# Patient Record
Sex: Female | Born: 1970 | Race: White | Hispanic: No | Smoking: Current every day smoker
Health system: Southern US, Community
[De-identification: ages and names within clinical notes are randomized; demographics above are authoritative.]

## PROBLEM LIST (undated history)

## (undated) DIAGNOSIS — Z8619 Personal history of other infectious and parasitic diseases: Secondary | ICD-10-CM

## (undated) DIAGNOSIS — G43909 Migraine, unspecified, not intractable, without status migrainosus: Secondary | ICD-10-CM

## (undated) DIAGNOSIS — I82409 Acute embolism and thrombosis of unspecified deep veins of unspecified lower extremity: Secondary | ICD-10-CM

## (undated) DIAGNOSIS — I639 Cerebral infarction, unspecified: Secondary | ICD-10-CM

## (undated) DIAGNOSIS — R9431 Abnormal electrocardiogram [ECG] [EKG]: Secondary | ICD-10-CM

## (undated) DIAGNOSIS — F319 Bipolar disorder, unspecified: Secondary | ICD-10-CM

## (undated) DIAGNOSIS — N952 Postmenopausal atrophic vaginitis: Secondary | ICD-10-CM

## (undated) DIAGNOSIS — R079 Chest pain, unspecified: Secondary | ICD-10-CM

## (undated) DIAGNOSIS — B379 Candidiasis, unspecified: Secondary | ICD-10-CM

## (undated) DIAGNOSIS — N951 Menopausal and female climacteric states: Secondary | ICD-10-CM

## (undated) DIAGNOSIS — E039 Hypothyroidism, unspecified: Secondary | ICD-10-CM

## (undated) DIAGNOSIS — IMO0002 Reserved for concepts with insufficient information to code with codable children: Secondary | ICD-10-CM

## (undated) DIAGNOSIS — F419 Anxiety disorder, unspecified: Secondary | ICD-10-CM

## (undated) DIAGNOSIS — E785 Hyperlipidemia, unspecified: Secondary | ICD-10-CM

## (undated) DIAGNOSIS — E7800 Pure hypercholesterolemia, unspecified: Secondary | ICD-10-CM

## (undated) DIAGNOSIS — I10 Essential (primary) hypertension: Secondary | ICD-10-CM

## (undated) HISTORY — PX: TONSILLECTOMY: SUR1361

## (undated) HISTORY — DX: Migraine, unspecified, not intractable, without status migrainosus: G43.909

## (undated) HISTORY — PX: CHOLECYSTECTOMY: SHX55

## (undated) HISTORY — DX: Pure hypercholesterolemia, unspecified: E78.00

## (undated) HISTORY — PX: APPENDECTOMY: SHX54

## (undated) HISTORY — DX: Candidiasis, unspecified: B37.9

## (undated) HISTORY — DX: Menopausal and female climacteric states: N95.1

## (undated) HISTORY — DX: Abnormal electrocardiogram (ECG) (EKG): R94.31

## (undated) HISTORY — DX: Personal history of other infectious and parasitic diseases: Z86.19

## (undated) HISTORY — DX: Hypothyroidism, unspecified: E03.9

## (undated) HISTORY — PX: WISDOM TOOTH EXTRACTION: SHX21

## (undated) HISTORY — DX: Cerebral infarction, unspecified: I63.9

## (undated) HISTORY — DX: Anxiety disorder, unspecified: F41.9

## (undated) HISTORY — DX: Reserved for concepts with insufficient information to code with codable children: IMO0002

## (undated) HISTORY — DX: Hyperlipidemia, unspecified: E78.5

## (undated) HISTORY — PX: CARPAL TUNNEL RELEASE: SHX101

## (undated) HISTORY — PX: OTHER SURGICAL HISTORY: SHX169

## (undated) HISTORY — DX: Chest pain, unspecified: R07.9

## (undated) HISTORY — DX: Postmenopausal atrophic vaginitis: N95.2

---

## 1998-07-14 ENCOUNTER — Ambulatory Visit (HOSPITAL_COMMUNITY): Admission: RE | Admit: 1998-07-14 | Discharge: 1998-07-14 | Payer: Self-pay | Admitting: Family Medicine

## 1998-07-14 ENCOUNTER — Encounter: Payer: Self-pay | Admitting: Family Medicine

## 1998-07-19 ENCOUNTER — Inpatient Hospital Stay (HOSPITAL_COMMUNITY): Admission: AD | Admit: 1998-07-19 | Discharge: 1998-07-19 | Payer: Self-pay | Admitting: Obstetrics and Gynecology

## 1998-07-30 ENCOUNTER — Emergency Department (HOSPITAL_COMMUNITY): Admission: EM | Admit: 1998-07-30 | Discharge: 1998-07-30 | Payer: Self-pay

## 1998-08-06 ENCOUNTER — Encounter: Payer: Self-pay | Admitting: Emergency Medicine

## 1998-08-06 ENCOUNTER — Emergency Department (HOSPITAL_COMMUNITY): Admission: EM | Admit: 1998-08-06 | Discharge: 1998-08-06 | Payer: Self-pay | Admitting: Emergency Medicine

## 1998-10-15 ENCOUNTER — Ambulatory Visit (HOSPITAL_COMMUNITY): Admission: RE | Admit: 1998-10-15 | Discharge: 1998-10-15 | Payer: Self-pay | Admitting: Family Medicine

## 1998-10-15 ENCOUNTER — Encounter: Payer: Self-pay | Admitting: Family Medicine

## 1998-10-31 HISTORY — PX: ABDOMINAL HYSTERECTOMY: SHX81

## 1998-11-15 ENCOUNTER — Emergency Department (HOSPITAL_COMMUNITY): Admission: EM | Admit: 1998-11-15 | Discharge: 1998-11-16 | Payer: Self-pay | Admitting: Emergency Medicine

## 1998-11-15 ENCOUNTER — Encounter: Payer: Self-pay | Admitting: Emergency Medicine

## 1998-11-16 ENCOUNTER — Encounter: Payer: Self-pay | Admitting: Emergency Medicine

## 1999-01-12 ENCOUNTER — Inpatient Hospital Stay (HOSPITAL_COMMUNITY): Admission: AD | Admit: 1999-01-12 | Discharge: 1999-01-12 | Payer: Self-pay | Admitting: Obstetrics and Gynecology

## 1999-02-11 ENCOUNTER — Encounter: Payer: Self-pay | Admitting: Emergency Medicine

## 1999-02-11 ENCOUNTER — Emergency Department (HOSPITAL_COMMUNITY): Admission: EM | Admit: 1999-02-11 | Discharge: 1999-02-11 | Payer: Self-pay | Admitting: Emergency Medicine

## 1999-02-17 ENCOUNTER — Emergency Department (HOSPITAL_COMMUNITY): Admission: EM | Admit: 1999-02-17 | Discharge: 1999-02-18 | Payer: Self-pay | Admitting: Emergency Medicine

## 1999-04-12 ENCOUNTER — Emergency Department (HOSPITAL_COMMUNITY): Admission: EM | Admit: 1999-04-12 | Discharge: 1999-04-12 | Payer: Self-pay | Admitting: Emergency Medicine

## 1999-05-09 ENCOUNTER — Emergency Department (HOSPITAL_COMMUNITY): Admission: EM | Admit: 1999-05-09 | Discharge: 1999-05-09 | Payer: Self-pay | Admitting: *Deleted

## 1999-06-02 ENCOUNTER — Emergency Department (HOSPITAL_COMMUNITY): Admission: EM | Admit: 1999-06-02 | Discharge: 1999-06-02 | Payer: Self-pay | Admitting: Emergency Medicine

## 1999-06-03 ENCOUNTER — Emergency Department (HOSPITAL_COMMUNITY): Admission: EM | Admit: 1999-06-03 | Discharge: 1999-06-03 | Payer: Self-pay | Admitting: Emergency Medicine

## 1999-07-04 ENCOUNTER — Emergency Department (HOSPITAL_COMMUNITY): Admission: EM | Admit: 1999-07-04 | Discharge: 1999-07-04 | Payer: Self-pay | Admitting: Emergency Medicine

## 1999-08-01 ENCOUNTER — Emergency Department (HOSPITAL_COMMUNITY): Admission: EM | Admit: 1999-08-01 | Discharge: 1999-08-01 | Payer: Self-pay | Admitting: Emergency Medicine

## 1999-11-01 HISTORY — PX: REPLACEMENT TOTAL KNEE: SUR1224

## 1999-11-29 ENCOUNTER — Emergency Department (HOSPITAL_COMMUNITY): Admission: EM | Admit: 1999-11-29 | Discharge: 1999-11-29 | Payer: Self-pay | Admitting: Emergency Medicine

## 1999-12-14 ENCOUNTER — Emergency Department (HOSPITAL_COMMUNITY): Admission: EM | Admit: 1999-12-14 | Discharge: 1999-12-14 | Payer: Self-pay

## 2000-01-05 ENCOUNTER — Ambulatory Visit (HOSPITAL_COMMUNITY): Admission: RE | Admit: 2000-01-05 | Discharge: 2000-01-05 | Payer: Self-pay | Admitting: Family Medicine

## 2000-01-05 ENCOUNTER — Encounter: Payer: Self-pay | Admitting: Family Medicine

## 2000-02-07 ENCOUNTER — Emergency Department (HOSPITAL_COMMUNITY): Admission: EM | Admit: 2000-02-07 | Discharge: 2000-02-08 | Payer: Self-pay | Admitting: Emergency Medicine

## 2000-02-08 ENCOUNTER — Encounter: Payer: Self-pay | Admitting: Emergency Medicine

## 2000-02-14 ENCOUNTER — Encounter: Payer: Self-pay | Admitting: Family Medicine

## 2000-02-14 ENCOUNTER — Ambulatory Visit (HOSPITAL_COMMUNITY): Admission: RE | Admit: 2000-02-14 | Discharge: 2000-02-14 | Payer: Self-pay | Admitting: Family Medicine

## 2000-02-23 ENCOUNTER — Emergency Department (HOSPITAL_COMMUNITY): Admission: EM | Admit: 2000-02-23 | Discharge: 2000-02-24 | Payer: Self-pay | Admitting: Internal Medicine

## 2000-04-06 ENCOUNTER — Inpatient Hospital Stay (HOSPITAL_COMMUNITY): Admission: AD | Admit: 2000-04-06 | Discharge: 2000-04-06 | Payer: Self-pay | Admitting: Obstetrics and Gynecology

## 2000-04-07 ENCOUNTER — Encounter: Payer: Self-pay | Admitting: Obstetrics and Gynecology

## 2000-06-08 ENCOUNTER — Emergency Department (HOSPITAL_COMMUNITY): Admission: EM | Admit: 2000-06-08 | Discharge: 2000-06-09 | Payer: Self-pay | Admitting: *Deleted

## 2000-06-28 ENCOUNTER — Inpatient Hospital Stay: Admission: EM | Admit: 2000-06-28 | Discharge: 2000-06-29 | Payer: Self-pay | Admitting: Obstetrics and Gynecology

## 2000-06-28 ENCOUNTER — Encounter: Payer: Self-pay | Admitting: Obstetrics and Gynecology

## 2000-08-11 ENCOUNTER — Emergency Department (HOSPITAL_COMMUNITY): Admission: EM | Admit: 2000-08-11 | Discharge: 2000-08-11 | Payer: Self-pay | Admitting: Emergency Medicine

## 2000-08-11 ENCOUNTER — Encounter: Payer: Self-pay | Admitting: Emergency Medicine

## 2000-08-31 ENCOUNTER — Inpatient Hospital Stay (HOSPITAL_COMMUNITY): Admission: AD | Admit: 2000-08-31 | Discharge: 2000-08-31 | Payer: Self-pay | Admitting: Obstetrics and Gynecology

## 2000-09-04 ENCOUNTER — Emergency Department (HOSPITAL_COMMUNITY): Admission: EM | Admit: 2000-09-04 | Discharge: 2000-09-05 | Payer: Self-pay | Admitting: Emergency Medicine

## 2000-09-18 ENCOUNTER — Inpatient Hospital Stay (HOSPITAL_COMMUNITY): Admission: AD | Admit: 2000-09-18 | Discharge: 2000-09-18 | Payer: Self-pay | Admitting: Obstetrics and Gynecology

## 2000-09-19 ENCOUNTER — Emergency Department (HOSPITAL_COMMUNITY): Admission: EM | Admit: 2000-09-19 | Discharge: 2000-09-20 | Payer: Self-pay | Admitting: Emergency Medicine

## 2000-09-29 ENCOUNTER — Emergency Department (HOSPITAL_COMMUNITY): Admission: EM | Admit: 2000-09-29 | Discharge: 2000-09-29 | Payer: Self-pay | Admitting: Emergency Medicine

## 2000-09-30 ENCOUNTER — Emergency Department (HOSPITAL_COMMUNITY): Admission: EM | Admit: 2000-09-30 | Discharge: 2000-09-30 | Payer: Self-pay | Admitting: Emergency Medicine

## 2000-11-27 ENCOUNTER — Encounter: Payer: Self-pay | Admitting: Emergency Medicine

## 2000-11-27 ENCOUNTER — Emergency Department (HOSPITAL_COMMUNITY): Admission: EM | Admit: 2000-11-27 | Discharge: 2000-11-27 | Payer: Self-pay | Admitting: Emergency Medicine

## 2001-03-24 ENCOUNTER — Emergency Department (HOSPITAL_COMMUNITY): Admission: EM | Admit: 2001-03-24 | Discharge: 2001-03-24 | Payer: Self-pay | Admitting: *Deleted

## 2001-04-08 ENCOUNTER — Emergency Department (HOSPITAL_COMMUNITY): Admission: EM | Admit: 2001-04-08 | Discharge: 2001-04-08 | Payer: Self-pay | Admitting: Emergency Medicine

## 2001-04-12 ENCOUNTER — Encounter: Payer: Self-pay | Admitting: Obstetrics and Gynecology

## 2001-04-12 ENCOUNTER — Inpatient Hospital Stay (HOSPITAL_COMMUNITY): Admission: AD | Admit: 2001-04-12 | Discharge: 2001-04-12 | Payer: Self-pay | Admitting: Obstetrics and Gynecology

## 2001-04-15 ENCOUNTER — Emergency Department (HOSPITAL_COMMUNITY): Admission: EM | Admit: 2001-04-15 | Discharge: 2001-04-15 | Payer: Self-pay | Admitting: Emergency Medicine

## 2001-04-15 ENCOUNTER — Encounter: Payer: Self-pay | Admitting: Emergency Medicine

## 2001-06-24 ENCOUNTER — Emergency Department (HOSPITAL_COMMUNITY): Admission: EM | Admit: 2001-06-24 | Discharge: 2001-06-25 | Payer: Self-pay | Admitting: Emergency Medicine

## 2001-07-13 ENCOUNTER — Emergency Department (HOSPITAL_COMMUNITY): Admission: EM | Admit: 2001-07-13 | Discharge: 2001-07-13 | Payer: Self-pay | Admitting: *Deleted

## 2001-07-17 ENCOUNTER — Other Ambulatory Visit: Admission: RE | Admit: 2001-07-17 | Discharge: 2001-07-17 | Payer: Self-pay | Admitting: Obstetrics and Gynecology

## 2001-08-28 ENCOUNTER — Encounter: Admission: RE | Admit: 2001-08-28 | Discharge: 2001-08-28 | Payer: Self-pay | Admitting: Orthopedic Surgery

## 2001-08-28 ENCOUNTER — Encounter: Payer: Self-pay | Admitting: Orthopedic Surgery

## 2001-10-12 ENCOUNTER — Emergency Department (HOSPITAL_COMMUNITY): Admission: EM | Admit: 2001-10-12 | Discharge: 2001-10-12 | Payer: Self-pay | Admitting: Emergency Medicine

## 2001-12-18 ENCOUNTER — Encounter: Payer: Self-pay | Admitting: Emergency Medicine

## 2001-12-18 ENCOUNTER — Emergency Department (HOSPITAL_COMMUNITY): Admission: EM | Admit: 2001-12-18 | Discharge: 2001-12-18 | Payer: Self-pay | Admitting: Emergency Medicine

## 2001-12-20 ENCOUNTER — Emergency Department (HOSPITAL_COMMUNITY): Admission: EM | Admit: 2001-12-20 | Discharge: 2001-12-20 | Payer: Self-pay | Admitting: Emergency Medicine

## 2002-02-24 ENCOUNTER — Emergency Department (HOSPITAL_COMMUNITY): Admission: EM | Admit: 2002-02-24 | Discharge: 2002-02-25 | Payer: Self-pay | Admitting: Emergency Medicine

## 2002-02-26 ENCOUNTER — Emergency Department (HOSPITAL_COMMUNITY): Admission: EM | Admit: 2002-02-26 | Discharge: 2002-02-26 | Payer: Self-pay | Admitting: Emergency Medicine

## 2002-02-27 ENCOUNTER — Inpatient Hospital Stay (HOSPITAL_COMMUNITY): Admission: EM | Admit: 2002-02-27 | Discharge: 2002-03-04 | Payer: Self-pay | Admitting: Internal Medicine

## 2002-03-01 ENCOUNTER — Encounter: Payer: Self-pay | Admitting: Nephrology

## 2002-04-11 ENCOUNTER — Encounter: Payer: Self-pay | Admitting: Family Medicine

## 2002-04-11 ENCOUNTER — Ambulatory Visit (HOSPITAL_COMMUNITY): Admission: RE | Admit: 2002-04-11 | Discharge: 2002-04-11 | Payer: Self-pay | Admitting: Family Medicine

## 2002-04-21 ENCOUNTER — Emergency Department (HOSPITAL_COMMUNITY): Admission: EM | Admit: 2002-04-21 | Discharge: 2002-04-21 | Payer: Self-pay

## 2002-05-18 ENCOUNTER — Emergency Department (HOSPITAL_COMMUNITY): Admission: EM | Admit: 2002-05-18 | Discharge: 2002-05-18 | Payer: Self-pay | Admitting: Emergency Medicine

## 2002-05-23 ENCOUNTER — Emergency Department (HOSPITAL_COMMUNITY): Admission: EM | Admit: 2002-05-23 | Discharge: 2002-05-23 | Payer: Self-pay | Admitting: Emergency Medicine

## 2002-05-23 ENCOUNTER — Encounter: Payer: Self-pay | Admitting: Emergency Medicine

## 2002-05-31 ENCOUNTER — Emergency Department (HOSPITAL_COMMUNITY): Admission: EM | Admit: 2002-05-31 | Discharge: 2002-06-01 | Payer: Self-pay | Admitting: Emergency Medicine

## 2002-06-30 ENCOUNTER — Emergency Department (HOSPITAL_COMMUNITY): Admission: EM | Admit: 2002-06-30 | Discharge: 2002-06-30 | Payer: Self-pay | Admitting: Emergency Medicine

## 2002-07-16 ENCOUNTER — Other Ambulatory Visit: Admission: RE | Admit: 2002-07-16 | Discharge: 2002-07-16 | Payer: Self-pay | Admitting: Obstetrics & Gynecology

## 2002-08-12 ENCOUNTER — Emergency Department (HOSPITAL_COMMUNITY): Admission: EM | Admit: 2002-08-12 | Discharge: 2002-08-12 | Payer: Self-pay | Admitting: Emergency Medicine

## 2002-08-27 ENCOUNTER — Emergency Department (HOSPITAL_COMMUNITY): Admission: EM | Admit: 2002-08-27 | Discharge: 2002-08-27 | Payer: Self-pay | Admitting: *Deleted

## 2002-09-09 ENCOUNTER — Emergency Department (HOSPITAL_COMMUNITY): Admission: EM | Admit: 2002-09-09 | Discharge: 2002-09-09 | Payer: Self-pay | Admitting: Emergency Medicine

## 2002-11-07 ENCOUNTER — Emergency Department (HOSPITAL_COMMUNITY): Admission: EM | Admit: 2002-11-07 | Discharge: 2002-11-08 | Payer: Self-pay | Admitting: Emergency Medicine

## 2002-11-08 ENCOUNTER — Encounter: Payer: Self-pay | Admitting: Emergency Medicine

## 2002-12-05 ENCOUNTER — Encounter: Payer: Self-pay | Admitting: Internal Medicine

## 2002-12-05 ENCOUNTER — Ambulatory Visit (HOSPITAL_COMMUNITY): Admission: RE | Admit: 2002-12-05 | Discharge: 2002-12-05 | Payer: Self-pay | Admitting: Internal Medicine

## 2003-02-08 ENCOUNTER — Emergency Department (HOSPITAL_COMMUNITY): Admission: EM | Admit: 2003-02-08 | Discharge: 2003-02-08 | Payer: Self-pay | Admitting: Emergency Medicine

## 2003-02-09 ENCOUNTER — Emergency Department (HOSPITAL_COMMUNITY): Admission: EM | Admit: 2003-02-09 | Discharge: 2003-02-09 | Payer: Self-pay | Admitting: Emergency Medicine

## 2003-02-15 ENCOUNTER — Encounter: Payer: Self-pay | Admitting: Emergency Medicine

## 2003-02-15 ENCOUNTER — Inpatient Hospital Stay (HOSPITAL_COMMUNITY): Admission: EM | Admit: 2003-02-15 | Discharge: 2003-02-19 | Payer: Self-pay | Admitting: Emergency Medicine

## 2003-04-26 ENCOUNTER — Emergency Department (HOSPITAL_COMMUNITY): Admission: EM | Admit: 2003-04-26 | Discharge: 2003-04-26 | Payer: Self-pay | Admitting: Emergency Medicine

## 2003-05-11 ENCOUNTER — Emergency Department (HOSPITAL_COMMUNITY): Admission: EM | Admit: 2003-05-11 | Discharge: 2003-05-11 | Payer: Self-pay | Admitting: Emergency Medicine

## 2003-05-21 ENCOUNTER — Other Ambulatory Visit: Admission: RE | Admit: 2003-05-21 | Discharge: 2003-05-21 | Payer: Self-pay | Admitting: Obstetrics and Gynecology

## 2003-06-09 ENCOUNTER — Emergency Department (HOSPITAL_COMMUNITY): Admission: EM | Admit: 2003-06-09 | Discharge: 2003-06-09 | Payer: Self-pay | Admitting: Emergency Medicine

## 2003-06-22 ENCOUNTER — Emergency Department (HOSPITAL_COMMUNITY): Admission: EM | Admit: 2003-06-22 | Discharge: 2003-06-22 | Payer: Self-pay | Admitting: Emergency Medicine

## 2003-07-05 ENCOUNTER — Emergency Department (HOSPITAL_COMMUNITY): Admission: EM | Admit: 2003-07-05 | Discharge: 2003-07-06 | Payer: Self-pay

## 2003-07-06 ENCOUNTER — Encounter: Payer: Self-pay | Admitting: Emergency Medicine

## 2003-07-14 ENCOUNTER — Encounter: Payer: Self-pay | Admitting: Emergency Medicine

## 2003-07-14 ENCOUNTER — Emergency Department (HOSPITAL_COMMUNITY): Admission: EM | Admit: 2003-07-14 | Discharge: 2003-07-14 | Payer: Self-pay | Admitting: Emergency Medicine

## 2003-07-31 ENCOUNTER — Emergency Department (HOSPITAL_COMMUNITY): Admission: EM | Admit: 2003-07-31 | Discharge: 2003-07-31 | Payer: Self-pay | Admitting: Emergency Medicine

## 2003-07-31 ENCOUNTER — Encounter: Payer: Self-pay | Admitting: Emergency Medicine

## 2003-08-08 ENCOUNTER — Encounter: Payer: Self-pay | Admitting: Emergency Medicine

## 2003-08-08 ENCOUNTER — Emergency Department (HOSPITAL_COMMUNITY): Admission: EM | Admit: 2003-08-08 | Discharge: 2003-08-08 | Payer: Self-pay | Admitting: Emergency Medicine

## 2003-08-14 ENCOUNTER — Encounter (INDEPENDENT_AMBULATORY_CARE_PROVIDER_SITE_OTHER): Payer: Self-pay

## 2003-08-14 ENCOUNTER — Inpatient Hospital Stay (HOSPITAL_COMMUNITY): Admission: RE | Admit: 2003-08-14 | Discharge: 2003-08-17 | Payer: Self-pay | Admitting: Obstetrics and Gynecology

## 2003-10-27 ENCOUNTER — Emergency Department (HOSPITAL_COMMUNITY): Admission: EM | Admit: 2003-10-27 | Discharge: 2003-10-28 | Payer: Self-pay | Admitting: Family Medicine

## 2003-11-12 ENCOUNTER — Emergency Department (HOSPITAL_COMMUNITY): Admission: EM | Admit: 2003-11-12 | Discharge: 2003-11-12 | Payer: Self-pay | Admitting: Emergency Medicine

## 2003-11-22 ENCOUNTER — Emergency Department (HOSPITAL_COMMUNITY): Admission: EM | Admit: 2003-11-22 | Discharge: 2003-11-22 | Payer: Self-pay | Admitting: Emergency Medicine

## 2003-12-20 ENCOUNTER — Emergency Department (HOSPITAL_COMMUNITY): Admission: EM | Admit: 2003-12-20 | Discharge: 2003-12-20 | Payer: Self-pay | Admitting: Emergency Medicine

## 2004-01-02 ENCOUNTER — Observation Stay (HOSPITAL_COMMUNITY): Admission: EM | Admit: 2004-01-02 | Discharge: 2004-01-03 | Payer: Self-pay | Admitting: Emergency Medicine

## 2004-01-02 ENCOUNTER — Encounter (INDEPENDENT_AMBULATORY_CARE_PROVIDER_SITE_OTHER): Payer: Self-pay | Admitting: *Deleted

## 2004-02-08 ENCOUNTER — Emergency Department (HOSPITAL_COMMUNITY): Admission: AD | Admit: 2004-02-08 | Discharge: 2004-02-08 | Payer: Self-pay | Admitting: Family Medicine

## 2004-02-17 ENCOUNTER — Emergency Department (HOSPITAL_COMMUNITY): Admission: EM | Admit: 2004-02-17 | Discharge: 2004-02-17 | Payer: Self-pay | Admitting: Family Medicine

## 2004-02-20 ENCOUNTER — Emergency Department (HOSPITAL_COMMUNITY): Admission: EM | Admit: 2004-02-20 | Discharge: 2004-02-20 | Payer: Self-pay | Admitting: Family Medicine

## 2004-03-16 ENCOUNTER — Emergency Department (HOSPITAL_COMMUNITY): Admission: EM | Admit: 2004-03-16 | Discharge: 2004-03-17 | Payer: Self-pay | Admitting: Emergency Medicine

## 2004-03-19 ENCOUNTER — Emergency Department (HOSPITAL_COMMUNITY): Admission: EM | Admit: 2004-03-19 | Discharge: 2004-03-19 | Payer: Self-pay | Admitting: Family Medicine

## 2004-03-29 ENCOUNTER — Emergency Department (HOSPITAL_COMMUNITY): Admission: EM | Admit: 2004-03-29 | Discharge: 2004-03-29 | Payer: Self-pay | Admitting: Family Medicine

## 2004-04-09 ENCOUNTER — Inpatient Hospital Stay (HOSPITAL_COMMUNITY): Admission: AD | Admit: 2004-04-09 | Discharge: 2004-04-10 | Payer: Self-pay | Admitting: Obstetrics & Gynecology

## 2004-05-28 ENCOUNTER — Emergency Department (HOSPITAL_COMMUNITY): Admission: EM | Admit: 2004-05-28 | Discharge: 2004-05-28 | Payer: Self-pay | Admitting: Family Medicine

## 2004-06-24 ENCOUNTER — Ambulatory Visit (HOSPITAL_COMMUNITY): Admission: RE | Admit: 2004-06-24 | Discharge: 2004-06-24 | Payer: Self-pay | Admitting: Family Medicine

## 2004-07-21 ENCOUNTER — Ambulatory Visit: Payer: Self-pay | Admitting: Nurse Practitioner

## 2004-07-25 ENCOUNTER — Emergency Department (HOSPITAL_COMMUNITY): Admission: EM | Admit: 2004-07-25 | Discharge: 2004-07-25 | Payer: Self-pay | Admitting: Emergency Medicine

## 2004-08-17 ENCOUNTER — Inpatient Hospital Stay (HOSPITAL_COMMUNITY): Admission: AD | Admit: 2004-08-17 | Discharge: 2004-08-20 | Payer: Self-pay | Admitting: Orthopedic Surgery

## 2004-08-17 ENCOUNTER — Ambulatory Visit: Payer: Self-pay | Admitting: Physical Medicine & Rehabilitation

## 2004-08-20 ENCOUNTER — Inpatient Hospital Stay (HOSPITAL_COMMUNITY)
Admission: RE | Admit: 2004-08-20 | Discharge: 2004-09-01 | Payer: Self-pay | Admitting: Physical Medicine & Rehabilitation

## 2004-08-20 ENCOUNTER — Ambulatory Visit: Payer: Self-pay | Admitting: Physical Medicine & Rehabilitation

## 2004-09-14 ENCOUNTER — Encounter: Admission: RE | Admit: 2004-09-14 | Discharge: 2004-11-15 | Payer: Self-pay | Admitting: Orthopedic Surgery

## 2004-09-15 ENCOUNTER — Ambulatory Visit: Payer: Self-pay | Admitting: Nurse Practitioner

## 2004-09-16 ENCOUNTER — Inpatient Hospital Stay (HOSPITAL_COMMUNITY): Admission: EM | Admit: 2004-09-16 | Discharge: 2004-09-19 | Payer: Self-pay

## 2004-10-26 ENCOUNTER — Emergency Department (HOSPITAL_COMMUNITY): Admission: EM | Admit: 2004-10-26 | Discharge: 2004-10-26 | Payer: Self-pay | Admitting: Emergency Medicine

## 2004-11-08 ENCOUNTER — Ambulatory Visit: Payer: Self-pay | Admitting: Nurse Practitioner

## 2004-11-30 ENCOUNTER — Ambulatory Visit: Payer: Self-pay | Admitting: Psychiatry

## 2004-11-30 ENCOUNTER — Inpatient Hospital Stay (HOSPITAL_COMMUNITY): Admission: RE | Admit: 2004-11-30 | Discharge: 2004-12-02 | Payer: Self-pay | Admitting: Psychiatry

## 2004-12-02 ENCOUNTER — Inpatient Hospital Stay (HOSPITAL_COMMUNITY): Admission: EM | Admit: 2004-12-02 | Discharge: 2004-12-04 | Payer: Self-pay | Admitting: Emergency Medicine

## 2004-12-04 ENCOUNTER — Ambulatory Visit: Payer: Self-pay | Admitting: Psychiatry

## 2004-12-04 ENCOUNTER — Inpatient Hospital Stay (HOSPITAL_COMMUNITY): Admission: EM | Admit: 2004-12-04 | Discharge: 2004-12-07 | Payer: Self-pay | Admitting: Psychiatry

## 2004-12-16 ENCOUNTER — Ambulatory Visit: Payer: Self-pay | Admitting: Nurse Practitioner

## 2004-12-30 ENCOUNTER — Ambulatory Visit: Payer: Self-pay | Admitting: Nurse Practitioner

## 2005-01-07 ENCOUNTER — Encounter: Admission: RE | Admit: 2005-01-07 | Discharge: 2005-04-07 | Payer: Self-pay | Admitting: Orthopedic Surgery

## 2005-01-24 ENCOUNTER — Ambulatory Visit: Payer: Self-pay | Admitting: Nurse Practitioner

## 2005-02-04 ENCOUNTER — Ambulatory Visit: Payer: Self-pay | Admitting: Nurse Practitioner

## 2005-02-24 ENCOUNTER — Ambulatory Visit: Payer: Self-pay | Admitting: Nurse Practitioner

## 2005-03-07 ENCOUNTER — Ambulatory Visit: Payer: Self-pay | Admitting: Nurse Practitioner

## 2005-04-05 ENCOUNTER — Ambulatory Visit: Payer: Self-pay | Admitting: Psychiatry

## 2005-04-05 ENCOUNTER — Inpatient Hospital Stay (HOSPITAL_COMMUNITY): Admission: RE | Admit: 2005-04-05 | Discharge: 2005-04-08 | Payer: Self-pay | Admitting: Psychiatry

## 2005-05-20 ENCOUNTER — Ambulatory Visit (HOSPITAL_COMMUNITY): Admission: RE | Admit: 2005-05-20 | Discharge: 2005-05-20 | Payer: Self-pay | Admitting: Orthopedic Surgery

## 2005-05-31 ENCOUNTER — Other Ambulatory Visit: Admission: RE | Admit: 2005-05-31 | Discharge: 2005-05-31 | Payer: Self-pay | Admitting: Family Medicine

## 2005-05-31 ENCOUNTER — Ambulatory Visit: Payer: Self-pay | Admitting: Nurse Practitioner

## 2005-05-31 LAB — CONVERTED CEMR LAB: Pap Smear: NORMAL

## 2005-06-06 ENCOUNTER — Ambulatory Visit (HOSPITAL_COMMUNITY): Admission: RE | Admit: 2005-06-06 | Discharge: 2005-06-06 | Payer: Self-pay | Admitting: Family Medicine

## 2005-08-01 ENCOUNTER — Ambulatory Visit: Payer: Self-pay | Admitting: Nurse Practitioner

## 2005-09-08 ENCOUNTER — Ambulatory Visit: Payer: Self-pay | Admitting: Nurse Practitioner

## 2005-11-25 ENCOUNTER — Ambulatory Visit: Payer: Self-pay | Admitting: Nurse Practitioner

## 2005-12-04 ENCOUNTER — Emergency Department (HOSPITAL_COMMUNITY): Admission: EM | Admit: 2005-12-04 | Discharge: 2005-12-04 | Payer: Self-pay | Admitting: Family Medicine

## 2006-01-09 ENCOUNTER — Ambulatory Visit: Payer: Self-pay | Admitting: Nurse Practitioner

## 2006-01-25 ENCOUNTER — Ambulatory Visit: Payer: Self-pay | Admitting: Nurse Practitioner

## 2006-03-06 ENCOUNTER — Ambulatory Visit: Payer: Self-pay | Admitting: Nurse Practitioner

## 2006-03-31 ENCOUNTER — Ambulatory Visit: Payer: Self-pay | Admitting: Nurse Practitioner

## 2006-06-02 ENCOUNTER — Ambulatory Visit: Payer: Self-pay | Admitting: Nurse Practitioner

## 2006-06-12 ENCOUNTER — Ambulatory Visit: Payer: Self-pay | Admitting: Nurse Practitioner

## 2006-06-20 ENCOUNTER — Ambulatory Visit: Payer: Self-pay | Admitting: Nurse Practitioner

## 2006-06-23 ENCOUNTER — Ambulatory Visit (HOSPITAL_COMMUNITY): Admission: RE | Admit: 2006-06-23 | Discharge: 2006-06-23 | Payer: Self-pay | Admitting: Family Medicine

## 2006-07-11 ENCOUNTER — Ambulatory Visit (HOSPITAL_BASED_OUTPATIENT_CLINIC_OR_DEPARTMENT_OTHER): Admission: RE | Admit: 2006-07-11 | Discharge: 2006-07-11 | Payer: Self-pay | Admitting: Orthopedic Surgery

## 2006-07-24 ENCOUNTER — Ambulatory Visit: Payer: Self-pay | Admitting: Nurse Practitioner

## 2006-07-25 ENCOUNTER — Encounter: Admission: RE | Admit: 2006-07-25 | Discharge: 2006-10-23 | Payer: Self-pay | Admitting: Orthopedic Surgery

## 2006-08-21 ENCOUNTER — Ambulatory Visit: Payer: Self-pay | Admitting: Internal Medicine

## 2006-09-18 ENCOUNTER — Ambulatory Visit: Payer: Self-pay | Admitting: Nurse Practitioner

## 2006-09-18 LAB — CONVERTED CEMR LAB: TSH: 1.361 microintl units/mL

## 2006-10-09 ENCOUNTER — Ambulatory Visit: Payer: Self-pay | Admitting: Nurse Practitioner

## 2006-11-28 ENCOUNTER — Ambulatory Visit: Payer: Self-pay | Admitting: Nurse Practitioner

## 2007-01-17 ENCOUNTER — Encounter: Payer: Self-pay | Admitting: Nurse Practitioner

## 2007-01-17 DIAGNOSIS — F3130 Bipolar disorder, current episode depressed, mild or moderate severity, unspecified: Secondary | ICD-10-CM

## 2007-01-17 DIAGNOSIS — E039 Hypothyroidism, unspecified: Secondary | ICD-10-CM

## 2007-01-17 DIAGNOSIS — I10 Essential (primary) hypertension: Secondary | ICD-10-CM | POA: Insufficient documentation

## 2007-01-17 HISTORY — DX: Hypothyroidism, unspecified: E03.9

## 2007-01-17 HISTORY — DX: Bipolar disorder, current episode depressed, mild or moderate severity, unspecified: F31.30

## 2007-04-10 DIAGNOSIS — K219 Gastro-esophageal reflux disease without esophagitis: Secondary | ICD-10-CM

## 2007-04-10 DIAGNOSIS — E7800 Pure hypercholesterolemia, unspecified: Secondary | ICD-10-CM

## 2007-04-10 DIAGNOSIS — J309 Allergic rhinitis, unspecified: Secondary | ICD-10-CM

## 2007-04-10 HISTORY — DX: Gastro-esophageal reflux disease without esophagitis: K21.9

## 2007-04-10 HISTORY — DX: Allergic rhinitis, unspecified: J30.9

## 2007-04-10 HISTORY — DX: Pure hypercholesterolemia, unspecified: E78.00

## 2007-04-13 ENCOUNTER — Ambulatory Visit: Payer: Self-pay | Admitting: Family Medicine

## 2007-04-23 ENCOUNTER — Emergency Department (HOSPITAL_COMMUNITY): Admission: EM | Admit: 2007-04-23 | Discharge: 2007-04-23 | Payer: Self-pay | Admitting: Emergency Medicine

## 2007-05-15 ENCOUNTER — Ambulatory Visit (HOSPITAL_BASED_OUTPATIENT_CLINIC_OR_DEPARTMENT_OTHER): Admission: RE | Admit: 2007-05-15 | Discharge: 2007-05-15 | Payer: Self-pay | Admitting: Orthopedic Surgery

## 2007-07-11 ENCOUNTER — Emergency Department (HOSPITAL_COMMUNITY): Admission: EM | Admit: 2007-07-11 | Discharge: 2007-07-11 | Payer: Self-pay | Admitting: Emergency Medicine

## 2007-07-30 ENCOUNTER — Encounter (INDEPENDENT_AMBULATORY_CARE_PROVIDER_SITE_OTHER): Payer: Self-pay | Admitting: Nurse Practitioner

## 2007-07-30 ENCOUNTER — Ambulatory Visit: Payer: Self-pay | Admitting: Internal Medicine

## 2007-07-30 LAB — CONVERTED CEMR LAB: Valproic Acid Lvl: 61.7 ug/mL (ref 50.0–100.0)

## 2007-08-15 ENCOUNTER — Ambulatory Visit: Payer: Self-pay | Admitting: Family Medicine

## 2007-08-15 ENCOUNTER — Encounter (INDEPENDENT_AMBULATORY_CARE_PROVIDER_SITE_OTHER): Payer: Self-pay | Admitting: Nurse Practitioner

## 2007-08-15 LAB — CONVERTED CEMR LAB: TSH: 0.023 microintl units/mL — ABNORMAL LOW (ref 0.350–5.50)

## 2007-08-28 ENCOUNTER — Ambulatory Visit: Payer: Self-pay | Admitting: Family Medicine

## 2007-10-18 ENCOUNTER — Ambulatory Visit: Payer: Self-pay | Admitting: Internal Medicine

## 2008-03-18 ENCOUNTER — Emergency Department (HOSPITAL_COMMUNITY): Admission: EM | Admit: 2008-03-18 | Discharge: 2008-03-18 | Payer: Self-pay | Admitting: Emergency Medicine

## 2008-03-28 ENCOUNTER — Emergency Department (HOSPITAL_COMMUNITY): Admission: EM | Admit: 2008-03-28 | Discharge: 2008-03-28 | Payer: Self-pay | Admitting: Emergency Medicine

## 2008-04-17 ENCOUNTER — Ambulatory Visit: Payer: Self-pay | Admitting: Internal Medicine

## 2008-04-23 ENCOUNTER — Encounter: Admission: RE | Admit: 2008-04-23 | Discharge: 2008-04-23 | Payer: Self-pay | Admitting: Internal Medicine

## 2008-06-09 ENCOUNTER — Ambulatory Visit: Payer: Self-pay | Admitting: Internal Medicine

## 2008-06-09 ENCOUNTER — Encounter (INDEPENDENT_AMBULATORY_CARE_PROVIDER_SITE_OTHER): Payer: Self-pay | Admitting: Family Medicine

## 2008-07-31 ENCOUNTER — Ambulatory Visit: Payer: Self-pay | Admitting: Internal Medicine

## 2008-07-31 ENCOUNTER — Encounter: Payer: Self-pay | Admitting: Internal Medicine

## 2008-07-31 LAB — CONVERTED CEMR LAB
AST: 9 units/L (ref 0–37)
BUN: 18 mg/dL (ref 6–23)
Basophils Absolute: 0 10*3/uL (ref 0.0–0.1)
Calcium: 8.8 mg/dL (ref 8.4–10.5)
Chloride: 104 meq/L (ref 96–112)
Creatinine, Ser: 1.18 mg/dL (ref 0.40–1.20)
Eosinophils Relative: 1 % (ref 0–5)
Glucose, Bld: 80 mg/dL (ref 70–99)
HCT: 42.2 % (ref 36.0–46.0)
HDL: 56 mg/dL (ref >39)
Hemoglobin: 13.7 g/dL (ref 12.0–15.0)
Lymphs Abs: 2.7 10*3/uL (ref 0.7–4.0)
MCV: 95.9 fL (ref 78.0–100.0)
Monocytes Absolute: 0.5 10*3/uL (ref 0.1–1.0)
Monocytes Relative: 8 % (ref 3–12)
Neutro Abs: 3 10*3/uL (ref 1.7–7.7)
Platelets: 190 10*3/uL (ref 150–400)
Potassium: 4.4 meq/L (ref 3.5–5.3)
Total Protein: 6.3 g/dL (ref 6.0–8.3)

## 2008-10-13 ENCOUNTER — Ambulatory Visit: Payer: Self-pay | Admitting: Internal Medicine

## 2008-12-12 ENCOUNTER — Telehealth: Payer: Self-pay | Admitting: Internal Medicine

## 2009-01-14 ENCOUNTER — Encounter (INDEPENDENT_AMBULATORY_CARE_PROVIDER_SITE_OTHER): Payer: Self-pay | Admitting: Internal Medicine

## 2009-01-14 ENCOUNTER — Ambulatory Visit: Payer: Self-pay | Admitting: Internal Medicine

## 2009-01-14 LAB — CONVERTED CEMR LAB
AST: 6 units/L (ref 0–37)
Alkaline Phosphatase: 53 units/L (ref 39–117)
BUN: 21 mg/dL (ref 6–23)
Calcium: 9.1 mg/dL (ref 8.4–10.5)
Potassium: 4.8 meq/L (ref 3.5–5.3)
TSH: 0.498 microintl units/mL (ref 0.350–4.500)
Total Bilirubin: 0.4 mg/dL (ref 0.3–1.2)

## 2009-02-13 ENCOUNTER — Emergency Department (HOSPITAL_COMMUNITY): Admission: EM | Admit: 2009-02-13 | Discharge: 2009-02-13 | Payer: Self-pay | Admitting: Emergency Medicine

## 2009-02-27 ENCOUNTER — Emergency Department (HOSPITAL_COMMUNITY): Admission: EM | Admit: 2009-02-27 | Discharge: 2009-02-27 | Payer: Self-pay | Admitting: Emergency Medicine

## 2009-06-03 ENCOUNTER — Emergency Department (HOSPITAL_COMMUNITY): Admission: EM | Admit: 2009-06-03 | Discharge: 2009-06-04 | Payer: Self-pay | Admitting: Emergency Medicine

## 2009-06-16 ENCOUNTER — Telehealth (INDEPENDENT_AMBULATORY_CARE_PROVIDER_SITE_OTHER): Payer: Self-pay | Admitting: *Deleted

## 2009-06-23 ENCOUNTER — Ambulatory Visit: Payer: Self-pay | Admitting: Internal Medicine

## 2009-08-18 ENCOUNTER — Emergency Department (HOSPITAL_COMMUNITY): Admission: EM | Admit: 2009-08-18 | Discharge: 2009-08-18 | Payer: Self-pay | Admitting: Emergency Medicine

## 2009-08-26 ENCOUNTER — Emergency Department (HOSPITAL_COMMUNITY): Admission: EM | Admit: 2009-08-26 | Discharge: 2009-08-27 | Payer: Self-pay | Admitting: Emergency Medicine

## 2009-09-02 ENCOUNTER — Encounter: Admission: RE | Admit: 2009-09-02 | Discharge: 2009-09-02 | Payer: Self-pay | Admitting: Family Medicine

## 2009-09-09 ENCOUNTER — Encounter: Admission: RE | Admit: 2009-09-09 | Discharge: 2009-09-09 | Payer: Self-pay | Admitting: Family Medicine

## 2009-09-15 ENCOUNTER — Inpatient Hospital Stay (HOSPITAL_COMMUNITY): Admission: EM | Admit: 2009-09-15 | Discharge: 2009-09-16 | Payer: Self-pay | Admitting: Emergency Medicine

## 2009-09-15 ENCOUNTER — Telehealth (INDEPENDENT_AMBULATORY_CARE_PROVIDER_SITE_OTHER): Payer: Self-pay | Admitting: *Deleted

## 2009-10-01 ENCOUNTER — Emergency Department (HOSPITAL_COMMUNITY): Admission: EM | Admit: 2009-10-01 | Discharge: 2009-10-02 | Payer: Self-pay | Admitting: Emergency Medicine

## 2009-10-06 ENCOUNTER — Telehealth (INDEPENDENT_AMBULATORY_CARE_PROVIDER_SITE_OTHER): Payer: Self-pay | Admitting: *Deleted

## 2009-10-08 ENCOUNTER — Encounter (INDEPENDENT_AMBULATORY_CARE_PROVIDER_SITE_OTHER): Payer: Self-pay | Admitting: Internal Medicine

## 2009-10-08 ENCOUNTER — Ambulatory Visit: Payer: Self-pay | Admitting: Family Medicine

## 2009-10-08 LAB — CONVERTED CEMR LAB
Basophils Absolute: 0 10*3/uL (ref 0.0–0.1)
Basophils Relative: 0 % (ref 0–1)
Eosinophils Absolute: 0.1 10*3/uL (ref 0.0–0.7)
Eosinophils Relative: 1 % (ref 0–5)
Hemoglobin: 14.9 g/dL (ref 12.0–15.0)
Lymphocytes Relative: 49 % — ABNORMAL HIGH (ref 12–46)
MCHC: 35.3 g/dL (ref 30.0–36.0)
RBC: 4.52 M/uL (ref 3.87–5.11)
RDW: 12.9 % (ref 11.5–15.5)
TSH: 0.204 microintl units/mL — ABNORMAL LOW (ref 0.350–4.500)
WBC: 7.5 10*3/uL (ref 4.0–10.5)

## 2009-10-09 ENCOUNTER — Encounter (INDEPENDENT_AMBULATORY_CARE_PROVIDER_SITE_OTHER): Payer: Self-pay | Admitting: Internal Medicine

## 2009-10-25 ENCOUNTER — Inpatient Hospital Stay (HOSPITAL_COMMUNITY): Admission: EM | Admit: 2009-10-25 | Discharge: 2009-10-27 | Payer: Self-pay | Admitting: Emergency Medicine

## 2009-12-01 ENCOUNTER — Ambulatory Visit: Payer: Self-pay | Admitting: Internal Medicine

## 2009-12-09 ENCOUNTER — Ambulatory Visit: Payer: Self-pay | Admitting: Internal Medicine

## 2009-12-22 ENCOUNTER — Telehealth (INDEPENDENT_AMBULATORY_CARE_PROVIDER_SITE_OTHER): Payer: Self-pay | Admitting: *Deleted

## 2009-12-25 ENCOUNTER — Ambulatory Visit: Payer: Self-pay | Admitting: Internal Medicine

## 2009-12-25 LAB — CONVERTED CEMR LAB
AST: 12 units/L (ref 0–37)
Alkaline Phosphatase: 60 units/L (ref 39–117)
BUN: 21 mg/dL (ref 6–23)
Basophils Absolute: 0 10*3/uL (ref 0.0–0.1)
CO2: 25 meq/L (ref 19–32)
Eosinophils Relative: 1 % (ref 0–5)
HCT: 40.4 % (ref 36.0–46.0)
Monocytes Absolute: 0.6 10*3/uL (ref 0.1–1.0)
Neutrophils Relative %: 44 % (ref 43–77)
Platelets: 199 10*3/uL (ref 150–400)
RBC: 4.18 M/uL (ref 3.87–5.11)
Sodium: 138 meq/L (ref 135–145)
TSH: 5.943 microintl units/mL — ABNORMAL HIGH (ref 0.350–4.500)
Total Protein: 6.7 g/dL (ref 6.0–8.3)

## 2009-12-28 ENCOUNTER — Ambulatory Visit: Payer: Self-pay | Admitting: Internal Medicine

## 2010-01-11 ENCOUNTER — Telehealth (INDEPENDENT_AMBULATORY_CARE_PROVIDER_SITE_OTHER): Payer: Self-pay | Admitting: *Deleted

## 2010-01-14 ENCOUNTER — Ambulatory Visit: Payer: Self-pay | Admitting: Internal Medicine

## 2010-01-14 ENCOUNTER — Encounter (INDEPENDENT_AMBULATORY_CARE_PROVIDER_SITE_OTHER): Payer: Self-pay | Admitting: Internal Medicine

## 2010-01-14 LAB — CONVERTED CEMR LAB: CRP: 0 mg/dL (ref <0.6)

## 2010-02-03 ENCOUNTER — Encounter (INDEPENDENT_AMBULATORY_CARE_PROVIDER_SITE_OTHER): Payer: Self-pay | Admitting: Adult Health

## 2010-02-03 ENCOUNTER — Ambulatory Visit: Payer: Self-pay | Admitting: Internal Medicine

## 2010-02-03 LAB — CONVERTED CEMR LAB
AST: 9 units/L (ref 0–37)
Alkaline Phosphatase: 59 units/L (ref 39–117)
Calcium: 8.8 mg/dL (ref 8.4–10.5)
Chlamydia, Swab/Urine, PCR: NEGATIVE
Chloride: 105 meq/L (ref 96–112)
Glucose, Bld: 77 mg/dL (ref 70–99)
Hemoglobin: 13.5 g/dL (ref 12.0–15.0)
Lymphocytes Relative: 44 % (ref 12–46)
Lymphs Abs: 3.4 10*3/uL (ref 0.7–4.0)
MCV: 93.9 fL (ref 78.0–100.0)
Monocytes Absolute: 0.4 10*3/uL (ref 0.1–1.0)
Monocytes Relative: 5 % (ref 3–12)
Neutro Abs: 3.9 10*3/uL (ref 1.7–7.7)
Neutrophils Relative %: 50 % (ref 43–77)
Platelets: 188 10*3/uL (ref 150–400)
RBC: 4.29 M/uL (ref 3.87–5.11)

## 2010-02-04 ENCOUNTER — Encounter (INDEPENDENT_AMBULATORY_CARE_PROVIDER_SITE_OTHER): Payer: Self-pay | Admitting: Adult Health

## 2010-02-04 ENCOUNTER — Emergency Department (HOSPITAL_COMMUNITY): Admission: EM | Admit: 2010-02-04 | Discharge: 2010-02-04 | Payer: Self-pay | Admitting: Emergency Medicine

## 2010-02-04 ENCOUNTER — Ambulatory Visit (HOSPITAL_COMMUNITY): Admission: RE | Admit: 2010-02-04 | Discharge: 2010-02-04 | Payer: Self-pay | Admitting: Internal Medicine

## 2010-02-10 ENCOUNTER — Ambulatory Visit: Payer: Self-pay | Admitting: Adult Health

## 2010-02-17 ENCOUNTER — Ambulatory Visit: Payer: Self-pay | Admitting: Internal Medicine

## 2010-02-17 LAB — CONVERTED CEMR LAB: CRP: 0 mg/dL (ref <0.6)

## 2010-02-23 ENCOUNTER — Ambulatory Visit: Payer: Self-pay | Admitting: Internal Medicine

## 2010-04-22 ENCOUNTER — Ambulatory Visit: Payer: Self-pay | Admitting: Internal Medicine

## 2010-06-02 ENCOUNTER — Ambulatory Visit: Payer: Self-pay | Admitting: Internal Medicine

## 2010-07-01 ENCOUNTER — Ambulatory Visit: Payer: Self-pay | Admitting: Family Medicine

## 2010-07-02 ENCOUNTER — Encounter (INDEPENDENT_AMBULATORY_CARE_PROVIDER_SITE_OTHER): Payer: Self-pay | Admitting: Family Medicine

## 2010-07-04 ENCOUNTER — Emergency Department (HOSPITAL_COMMUNITY): Admission: EM | Admit: 2010-07-04 | Discharge: 2010-07-04 | Payer: Self-pay | Admitting: Emergency Medicine

## 2010-09-06 ENCOUNTER — Encounter: Admission: RE | Admit: 2010-09-06 | Discharge: 2010-09-06 | Payer: Self-pay | Admitting: Family Medicine

## 2010-10-09 ENCOUNTER — Emergency Department (HOSPITAL_COMMUNITY)
Admission: EM | Admit: 2010-10-09 | Discharge: 2010-10-09 | Payer: Self-pay | Source: Home / Self Care | Admitting: Family Medicine

## 2010-10-23 ENCOUNTER — Emergency Department (HOSPITAL_COMMUNITY)
Admission: EM | Admit: 2010-10-23 | Discharge: 2010-10-23 | Payer: Self-pay | Source: Home / Self Care | Admitting: Emergency Medicine

## 2010-10-31 DIAGNOSIS — I82409 Acute embolism and thrombosis of unspecified deep veins of unspecified lower extremity: Secondary | ICD-10-CM | POA: Insufficient documentation

## 2010-10-31 HISTORY — DX: Acute embolism and thrombosis of unspecified deep veins of unspecified lower extremity: I82.409

## 2010-11-15 ENCOUNTER — Ambulatory Visit (HOSPITAL_COMMUNITY)
Admission: RE | Admit: 2010-11-15 | Discharge: 2010-11-15 | Payer: Self-pay | Source: Home / Self Care | Attending: Orthopedic Surgery | Admitting: Orthopedic Surgery

## 2010-11-15 ENCOUNTER — Encounter (INDEPENDENT_AMBULATORY_CARE_PROVIDER_SITE_OTHER): Payer: Self-pay | Admitting: Orthopedic Surgery

## 2010-11-16 ENCOUNTER — Emergency Department (HOSPITAL_COMMUNITY)
Admission: EM | Admit: 2010-11-16 | Discharge: 2010-11-16 | Payer: Self-pay | Source: Home / Self Care | Admitting: Emergency Medicine

## 2010-11-21 ENCOUNTER — Encounter: Payer: Self-pay | Admitting: Nurse Practitioner

## 2010-11-21 ENCOUNTER — Encounter: Payer: Self-pay | Admitting: Family Medicine

## 2010-12-02 NOTE — Progress Notes (Signed)
Summary: triage/white patches in mouth  Phone Note Call from Patient   Reason for Call: Talk to Nurse Summary of Call: patient complaining of white patches that rub off and bleed in her mouth..She saw Dr. Pamalee Leyden on 12/09/09 for acute maxillary sinusitus and cough..She was given a z-pak with one refill...she states after taking the antibiotic she developed the white patches in her mouth which are very painful...Dr. Pamalee Leyden called in magic mouthwast upon patients reqest..She now states she wanted numbing medication to go along with it because of the pain. She also states she needs nasonex because of her sinus congestion. She stated to on call nurse last night she has a fever of 101.8.Marland KitchenMarland Kitchenshe has not taken her temp. today.Marland KitchenAppointment made in Dr. Neita Carp schedule tomorrow. Initial call taken by: Conchita Paris,  December 22, 2009 4:17 PM

## 2010-12-02 NOTE — Progress Notes (Signed)
Summary: triage/URI  Phone Note Call from Patient   Caller: Patient Reason for Call: Talk to Nurse Summary of Call: Patient states she is finished with her antibiotics for 4 days now... She is hoarse...sore throat.Marland KitchenMarland Kitchenpuffy eyes...and blowing blood out of her nose again.Marland KitchenMarland KitchenSpoke with Dr. Pamalee Leyden and advised to bring patient in okay to doulble book and she her tomorrow..Patient states understanding... Initial call taken by: Conchita Paris,  January 11, 2010 2:12 PM

## 2010-12-31 DIAGNOSIS — B379 Candidiasis, unspecified: Secondary | ICD-10-CM | POA: Insufficient documentation

## 2010-12-31 HISTORY — DX: Candidiasis, unspecified: B37.9

## 2011-01-19 LAB — COMPREHENSIVE METABOLIC PANEL
ALT: 8 U/L (ref 0–35)
AST: 12 U/L (ref 0–37)
Albumin: 3.2 g/dL — ABNORMAL LOW (ref 3.5–5.2)
CO2: 26 mEq/L (ref 19–32)
Chloride: 106 mEq/L (ref 96–112)
Creatinine, Ser: 0.76 mg/dL (ref 0.4–1.2)
GFR calc Af Amer: >60 mL/min (ref >60)
Potassium: 3.7 mEq/L (ref 3.5–5.1)
Sodium: 140 mEq/L (ref 135–145)
Total Bilirubin: 0.6 mg/dL (ref 0.3–1.2)

## 2011-01-19 LAB — DIFFERENTIAL
Basophils Absolute: 0 10*3/uL (ref 0.0–0.1)
Basophils Relative: 0 % (ref 0–1)
Eosinophils Absolute: 0.1 10*3/uL (ref 0.0–0.7)
Eosinophils Relative: 1 % (ref 0–5)
Lymphocytes Relative: 50 % — ABNORMAL HIGH (ref 12–46)

## 2011-01-19 LAB — URINALYSIS, ROUTINE W REFLEX MICROSCOPIC
Glucose, UA: NEGATIVE mg/dL
Hgb urine dipstick: NEGATIVE
Specific Gravity, Urine: 1.026 (ref 1.005–1.030)
pH: 6.5 (ref 5.0–8.0)

## 2011-01-19 LAB — CBC
MCV: 95.2 fL (ref 78.0–100.0)
Platelets: 198 10*3/uL (ref 150–400)
RBC: 4.12 MIL/uL (ref 3.87–5.11)
WBC: 9.6 10*3/uL (ref 4.0–10.5)

## 2011-01-19 LAB — URINE MICROSCOPIC-ADD ON

## 2011-01-31 LAB — CBC
HCT: 33.2 % — ABNORMAL LOW (ref 36.0–46.0)
Hemoglobin: 11.7 g/dL — ABNORMAL LOW (ref 12.0–15.0)
MCHC: 34.2 g/dL (ref 30.0–36.0)
MCHC: 35.1 g/dL (ref 30.0–36.0)
MCV: 94.8 fL (ref 78.0–100.0)
MCV: 94.9 fL (ref 78.0–100.0)
Platelets: 131 10*3/uL — ABNORMAL LOW (ref 150–400)
Platelets: 173 10*3/uL (ref 150–400)
RBC: 3.5 MIL/uL — ABNORMAL LOW (ref 3.87–5.11)
RBC: 4.05 MIL/uL (ref 3.87–5.11)
RDW: 12.3 % (ref 11.5–15.5)
RDW: 12.6 % (ref 11.5–15.5)
WBC: 6.2 10*3/uL (ref 4.0–10.5)

## 2011-01-31 LAB — COMPREHENSIVE METABOLIC PANEL
AST: 38 U/L — ABNORMAL HIGH (ref 0–37)
Albumin: 3.6 g/dL (ref 3.5–5.2)
Alkaline Phosphatase: 31 U/L — ABNORMAL LOW (ref 39–117)
Chloride: 101 mEq/L (ref 96–112)
GFR calc Af Amer: >60 mL/min (ref >60)
Potassium: 4 mEq/L (ref 3.5–5.1)
Sodium: 134 mEq/L — ABNORMAL LOW (ref 135–145)
Total Bilirubin: 1.9 mg/dL — ABNORMAL HIGH (ref 0.3–1.2)
Total Protein: 6.5 g/dL (ref 6.0–8.3)

## 2011-01-31 LAB — DIFFERENTIAL
Basophils Absolute: 0 10*3/uL (ref 0.0–0.1)
Basophils Relative: 0 % (ref 0–1)
Eosinophils Relative: 1 % (ref 0–5)
Monocytes Absolute: 0.6 10*3/uL (ref 0.1–1.0)
Monocytes Relative: 10 % (ref 3–12)

## 2011-01-31 LAB — URINALYSIS, ROUTINE W REFLEX MICROSCOPIC
Glucose, UA: NEGATIVE mg/dL
Hgb urine dipstick: NEGATIVE
Specific Gravity, Urine: 1.033 — ABNORMAL HIGH (ref 1.005–1.030)
Urobilinogen, UA: 1 mg/dL (ref 0.0–1.0)

## 2011-01-31 LAB — BASIC METABOLIC PANEL
CO2: 24 mEq/L (ref 19–32)
Calcium: 8.6 mg/dL (ref 8.4–10.5)
Chloride: 111 mEq/L (ref 96–112)
GFR calc Af Amer: >60 mL/min (ref >60)
Glucose, Bld: 90 mg/dL (ref 70–99)
Potassium: 3.4 mEq/L — ABNORMAL LOW (ref 3.5–5.1)
Sodium: 141 mEq/L (ref 135–145)

## 2011-01-31 LAB — URINE MICROSCOPIC-ADD ON

## 2011-01-31 LAB — URINE CULTURE: Culture: NO GROWTH

## 2011-01-31 LAB — POCT PREGNANCY, URINE: Preg Test, Ur: NEGATIVE

## 2011-02-01 LAB — URINALYSIS, ROUTINE W REFLEX MICROSCOPIC
Glucose, UA: NEGATIVE mg/dL
Ketones, ur: 15 mg/dL — AB
Protein, ur: NEGATIVE mg/dL
Urobilinogen, UA: 1 mg/dL (ref 0.0–1.0)

## 2011-02-01 LAB — COMPREHENSIVE METABOLIC PANEL
AST: 11 U/L (ref 0–37)
Albumin: 3.1 g/dL — ABNORMAL LOW (ref 3.5–5.2)
Alkaline Phosphatase: 35 U/L — ABNORMAL LOW (ref 39–117)
BUN: 19 mg/dL (ref 6–23)
Creatinine, Ser: 1.39 mg/dL — ABNORMAL HIGH (ref 0.4–1.2)
GFR calc Af Amer: 51 mL/min — ABNORMAL LOW (ref >60)
Potassium: 3.4 mEq/L — ABNORMAL LOW (ref 3.5–5.1)
Total Protein: 5.9 g/dL — ABNORMAL LOW (ref 6.0–8.3)

## 2011-02-01 LAB — CBC
HCT: 40.5 % (ref 36.0–46.0)
Platelets: 105 10*3/uL — ABNORMAL LOW (ref 150–400)
RDW: 12.2 % (ref 11.5–15.5)
WBC: 5.9 10*3/uL (ref 4.0–10.5)

## 2011-02-01 LAB — DIFFERENTIAL
Eosinophils Relative: 1 % (ref 0–5)
Lymphocytes Relative: 58 % — ABNORMAL HIGH (ref 12–46)
Monocytes Absolute: 0.4 10*3/uL (ref 0.1–1.0)
Monocytes Relative: 7 % (ref 3–12)
Neutro Abs: 2 10*3/uL (ref 1.7–7.7)

## 2011-02-02 LAB — CBC
MCV: 98.2 fL (ref 78.0–100.0)
Platelets: 158 10*3/uL (ref 150–400)
WBC: 7.6 10*3/uL (ref 4.0–10.5)

## 2011-02-02 LAB — URINE MICROSCOPIC-ADD ON

## 2011-02-02 LAB — URINALYSIS, ROUTINE W REFLEX MICROSCOPIC
Glucose, UA: NEGATIVE mg/dL
Glucose, UA: NEGATIVE mg/dL
Hgb urine dipstick: NEGATIVE
Ketones, ur: 15 mg/dL — AB
Specific Gravity, Urine: >1.03 — ABNORMAL HIGH (ref 1.005–1.030)
pH: 5.5 (ref 5.0–8.0)
pH: 6 (ref 5.0–8.0)

## 2011-02-02 LAB — COMPREHENSIVE METABOLIC PANEL
ALT: <8 U/L (ref 0–35)
AST: 11 U/L (ref 0–37)
Albumin: 3.3 g/dL — ABNORMAL LOW (ref 3.5–5.2)
Chloride: 106 mEq/L (ref 96–112)
Creatinine, Ser: 1.26 mg/dL — ABNORMAL HIGH (ref 0.4–1.2)
GFR calc Af Amer: 57 mL/min — ABNORMAL LOW (ref >60)
Potassium: 4.1 mEq/L (ref 3.5–5.1)
Sodium: 140 mEq/L (ref 135–145)
Total Bilirubin: 0.5 mg/dL (ref 0.3–1.2)

## 2011-02-02 LAB — POCT I-STAT, CHEM 8
BUN: 24 mg/dL — ABNORMAL HIGH (ref 6–23)
Calcium, Ion: 1.13 mmol/L (ref 1.12–1.32)
Chloride: 106 mEq/L (ref 96–112)
Glucose, Bld: 74 mg/dL (ref 70–99)
HCT: 45 % (ref 36.0–46.0)

## 2011-02-02 LAB — DIFFERENTIAL
Basophils Absolute: 0 10*3/uL (ref 0.0–0.1)
Eosinophils Relative: 1 % (ref 0–5)
Lymphocytes Relative: 41 % (ref 12–46)
Monocytes Absolute: 0.6 10*3/uL (ref 0.1–1.0)

## 2011-02-02 LAB — URINE CULTURE: Culture: NO GROWTH

## 2011-02-02 LAB — POCT PREGNANCY, URINE: Preg Test, Ur: NEGATIVE

## 2011-02-03 LAB — POCT I-STAT, CHEM 8
BUN: 17 mg/dL (ref 6–23)
Calcium, Ion: 1.11 mmol/L — ABNORMAL LOW (ref 1.12–1.32)
Chloride: 103 mEq/L (ref 96–112)
Creatinine, Ser: 0.6 mg/dL (ref 0.4–1.2)
Glucose, Bld: 96 mg/dL (ref 70–99)

## 2011-02-03 LAB — URINE MICROSCOPIC-ADD ON

## 2011-02-03 LAB — URINALYSIS, ROUTINE W REFLEX MICROSCOPIC
Nitrite: NEGATIVE
Specific Gravity, Urine: 1.025 (ref 1.005–1.030)
Urobilinogen, UA: 1 mg/dL (ref 0.0–1.0)
pH: 6 (ref 5.0–8.0)

## 2011-02-05 LAB — SEDIMENTATION RATE: Sed Rate: 5 mm/hr (ref 0–22)

## 2011-02-05 LAB — BASIC METABOLIC PANEL
BUN: 20 mg/dL (ref 6–23)
CO2: 27 mEq/L (ref 19–32)
Chloride: 104 mEq/L (ref 96–112)
Creatinine, Ser: 1.08 mg/dL (ref 0.4–1.2)
Glucose, Bld: 106 mg/dL — ABNORMAL HIGH (ref 70–99)

## 2011-02-05 LAB — CBC
MCHC: 35 g/dL (ref 30.0–36.0)
MCV: 97 fL (ref 78.0–100.0)
Platelets: 180 10*3/uL (ref 150–400)
RBC: 3.99 MIL/uL (ref 3.87–5.11)
RDW: 12.1 % (ref 11.5–15.5)

## 2011-03-04 DIAGNOSIS — N952 Postmenopausal atrophic vaginitis: Secondary | ICD-10-CM | POA: Insufficient documentation

## 2011-03-04 DIAGNOSIS — IMO0002 Reserved for concepts with insufficient information to code with codable children: Secondary | ICD-10-CM

## 2011-03-04 HISTORY — DX: Postmenopausal atrophic vaginitis: N95.2

## 2011-03-04 HISTORY — DX: Reserved for concepts with insufficient information to code with codable children: IMO0002

## 2011-03-12 ENCOUNTER — Emergency Department (HOSPITAL_COMMUNITY)
Admission: EM | Admit: 2011-03-12 | Discharge: 2011-03-12 | Disposition: A | Discharge status: Home / Self Care | Payer: Medicaid Other | Attending: Emergency Medicine | Admitting: Emergency Medicine

## 2011-03-12 DIAGNOSIS — M79609 Pain in unspecified limb: Secondary | ICD-10-CM | POA: Insufficient documentation

## 2011-03-12 DIAGNOSIS — E039 Hypothyroidism, unspecified: Secondary | ICD-10-CM | POA: Insufficient documentation

## 2011-03-12 DIAGNOSIS — Z86718 Personal history of other venous thrombosis and embolism: Secondary | ICD-10-CM | POA: Insufficient documentation

## 2011-03-12 DIAGNOSIS — I10 Essential (primary) hypertension: Secondary | ICD-10-CM | POA: Insufficient documentation

## 2011-03-12 DIAGNOSIS — F319 Bipolar disorder, unspecified: Secondary | ICD-10-CM | POA: Insufficient documentation

## 2011-03-12 DIAGNOSIS — Z7901 Long term (current) use of anticoagulants: Secondary | ICD-10-CM | POA: Insufficient documentation

## 2011-03-12 DIAGNOSIS — E7800 Pure hypercholesterolemia, unspecified: Secondary | ICD-10-CM | POA: Insufficient documentation

## 2011-03-12 LAB — DIFFERENTIAL
Eosinophils Absolute: 0.3 10*3/uL (ref 0.0–0.7)
Eosinophils Relative: 3 % (ref 0–5)
Lymphs Abs: 5.2 10*3/uL — ABNORMAL HIGH (ref 0.7–4.0)

## 2011-03-12 LAB — CBC
MCH: 30.6 pg (ref 26.0–34.0)
MCV: 88.9 fL (ref 78.0–100.0)
Platelets: 181 10*3/uL (ref 150–400)
RDW: 13.1 % (ref 11.5–15.5)
WBC: 10.4 10*3/uL (ref 4.0–10.5)

## 2011-03-12 LAB — APTT: aPTT: 30 seconds (ref 24–37)

## 2011-03-12 LAB — BASIC METABOLIC PANEL
BUN: 21 mg/dL (ref 6–23)
CO2: 27 mEq/L (ref 19–32)
Chloride: 103 mEq/L (ref 96–112)
Creatinine, Ser: 0.94 mg/dL (ref 0.4–1.2)

## 2011-03-15 NOTE — Op Note (Signed)
[name], [name]             ACCOUNT NO.:  [id number]   MEDICAL RECORD NO.:  [id number]          PATIENT TYPE:  AMB   LOCATION:  [facility]                         FACILITY:  [facility]   PHYSICIAN:  Deidre Ala, M.D.    DATE OF BIRTH:  April 10, 1971   DATE OF PROCEDURE:  05/15/2007  DATE OF DISCHARGE:                               OPERATIVE REPORT   PREOPERATIVE DIAGNOSIS:  Coccydynia, status post fall 4 months ago with  damage to sacrococcygeal joint.   POSTOPERATIVE DIAGNOSIS:  Coccydynia, status post fall 4 months ago with  damage to sacrococcygeal joint.   PROCEDURE:  Coccygectomy with beveling of distal sacrum.   SURGEON:  1. Charlesetta Shanks, M.D.   ASSISTANT:  Phineas Semen, P.A.   ANESTHESIA:  General endotracheal.   CULTURES:  None.   DRAINS:  None.   ESTIMATED BLOOD LOSS:  Less than 50 mL, replaced without.   PATHOLOGIC FINDINGS AND HISTORY:  [name] is a 40 year old female who  has poor pain tolerance.  Has had a total knee arthroplasty done in the  past.  Larey Seat skating with her children about 3-1/2 months ago, striking  her coccyx.  There was no obvious fracture but the coccyx was very loose  superficial and extremely tender.  She had tried conservative measures  including padding and had four cortisone injections which were  temporarily relieving.  Due to persistence of her discomfort, it was  elected to proceed with coccygectomy.  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  It was  resected without penetration of the bowel adjacent.  We beveled off the  distal sacrum.  There was obviously arthritic change in the  sacrococcygeal joint and the coccyx very loose.   PROCEDURE:  With adequate anesthesia obtained using endotracheal  technique, 1 gram Ancef given IV prophylaxis and the patient had a  preoperative Fleet's enema and had taken oral Keflex.  The patient was  placed in the prone position on chest rolls.  Tape  was then used with  Benzoin to spread the buttocks.  We then prepped out the perineum with  Betadine sponge and sealed with a plastic sealing drape.  We then  prepped with DuraPrep.  We then draped in the standard fashion.  After  standard prepping and draping, a longitudinal incision was then made  approximately 5 cm in length from the coccyx tip up to the  sacrococcygeal joint and just past.  Under loupe magnification, incision  was deepened sharply with a knife and hemostasis obtained using the  needle point Bovie electrocoagulator.  We then dissected down to the  coccyx, incised the periosteum longitudinally and then with a cutting  cautery, did a subperiosteal dissection around the shield shape of the  coccyx and distally, first starting distally and then right on bone to  the tip.  The bone was so friable that I had to go more proximal and  released the second coccygeal joint, taking coccyx up at that point and  peel off the undersurface soft tissue and periosteum, and then on both  sides, eventually removing it in its entirety.  There was a bit of bone  fragment left distally that I pulled out with rongeur and then  microdissection with cutting cautery.  We then cauterized bleeding  points.  I then thoroughly irrigated the wound, did a Valsalva, saw no  evidence of the bubbles or penetration or leak.  Hemostasis was  obtained.  I then beveled the distal sacrum so that it would not be  painful to sit on and used bone wax to seal.  Again thorough irrigation  was carried out.  Gelfoam was placed and then the periosteum and the  fascia over the coccyx was closed with 0 Vicryl.  We then used 2-0  Vicryl subcuticular and running subcuticular and then a 3-0 Monocryl  subcuticular with Dermabond, Tisseel, and then a silver dressing,  antibacterial over top of the Steri-Strips that had been placed.  A  bulky sterile compressive dressing was applied.  The patient having  tolerated procedure  well was taken to recovery room in satisfactory  condition.  Please note I did inject about 10 mL 0.5% Marcaine in and  about the wound.  The patient was then taken to recovery room in  satisfactory condition to be discharged per outpatient routine,  crutches, weightbearing as tolerated turning side-to-side and avoiding  excessive pressure over the next several days on the coccyx area.  Told  to call the office for recheck on Friday, given Percocet for pain and  Keflex for antibiotic prophylaxis for 10 days.   SUMMARY:  Wendy Section, MD, orthopedic surgeon my office thank to much.  Please note that Phineas Semen, assistant, was necessary and indicated  to help on this case due to the meticulous nature of it and the need for  retraction.  Please note that is an addendum.           ______________________________  V. Charlesetta Shanks, M.D.     VEP/MEDQ  D:  05/15/2007  T:  05/16/2007  Job:  [id number]

## 2011-03-18 NOTE — Discharge Summary (Signed)
[facility]  Patient:    [name], [name] Visit Number: [id number] MRN: [id number]          Service Type: MED Location: [id number] Attending Physician:  [name]. Dictated by:   [name], M.D. Admit Date:  02/27/2002 Discharge Date: 03/04/2002   CC:         [name], M.D.  [facility], [name]   Discharge Summary  DATE OF BIRTH:  [birth year]  PROBLEM LIST: 1. Lithium toxicity secondary to excessive ingestion, status post hemodialysis    x2 for same.  Lithium level at discharge 0.94. 2. Acute renal failure secondary to #1, status post hemodialysis x2.    Creatinine peak at 4.1, creatinine at discharge 1.0. 3. Hypokalemia, resolved at discharge. 4. Bipolar disorder, seen in consultation by Dr. Jeanie Chase, medically cleared    for outpatient follow-up for psychiatric disorder. 5. Hypertension.  The patient will be taken off of Maxzide due to the effects    of exacerbating lithium levels.  Will continue on Norvasc 5 mg one daily. 6. Hypothyroid.  DISCHARGE MEDICATIONS: 1. Norvasc 5 mg one daily. 2. Klonopin 0.5 mg one every 12 hours. 3. Synthroid 75 mcg one daily. 4. Effexor 75 mg two tablets in the morning and two tablets in the evening. 5. Lithium 600 mg in the morning and 300 mg in the evening with goal of    keeping Lithium level between 0.8 and 1.2. 6. Prevacid 30 mg one daily.  CONSULTING PHYSICIANS:  [name] who performed hemodialysis x2.  Dr. Jeanie Chase, psychiatry.  PROCEDURE:  Right Shiley catheter for hemodialysis, removed at the time of discharge.  STUDIES:  The patient had BMET significant for hypokalemia and acute renal failure with creatinine maximum at 4.1, at discharge 1.0.  Hypokalemia resolved at discharge.  CBC; white count of 14.6 on admission, hemoglobin 12.6, platelets 262.  On discharge white count of 9.4.  No infectious cause identified.  Lithium  level 3.5, lithium level at discharge 0.94.  HISTORY OF PRESENT ILLNESS:  A 40 year old white female with above medical problems presented to the ED on three occasions prior to being admitted with complaints of nausea and vomiting and diarrhea.  During this hospitalization she was found to have a lithium level in toxic range and occasional twitching. Question at the time of admission, the patient denied overingestion and an attempt at suicide.  However, due to the patients toxic level and neurologic symptoms, twitching, admission was deemed necessary for emergent hemodialysis. On further questioning, the patient did ultimately admit to taking too much Lithium, approximately six tablets a day for about one week and the patients reason for it was that she was not sure, she denied attempt at suicide. Please see dictated H&P for further details of history of present illness.  HOSPITAL COURSE:  #1 - Lithium toxicity secondary to overingestion plus or minus renal failure caused by dehydration from nausea and vomiting and diarrhea.  The patients lithium level was 3.5 on admission and the patient was having severe symptoms of nausea, vomiting, diarrhea, and irritability.  There were no EKG abnormalities noted.  The patient was seen in consultation by nephrology and it was agreed that emergent hemodialysis was indicated.  The patient underwent hemodialysis x2 with continued improvement in her renal function as well as decrease in her lithium level.  The patients lithium level remained in therapeutic range.  At the time of discharge lithium level at 0.94.  The patient was seen in consultation by psychiatry and it was felt that the patient was safe to be retried on Lithium.  It was thought that her Maxzide may have exacerbated her lithium levels as well.  Therefore, her Maxzide will be discontinued and the patients blood pressure will be controlled with Norvasc.  At the time of discharge, the  patient is medically stable and cleared from psychiatric standpoint.  #2 - Acute renal failure.  Please see #1, secondary to lithium toxicity, resolved at the time of discharge, status post hemodialysis x2.  #3 - Bipolar disorder.  The patient has a long history of bipolar.  She has been seen in consultation during this hospitalization by Dr. Jeanie Chase.  Dr. Tommy Medal impression is the patient has some cognitive deficit as a result of elevation in her lithium level.  Because of this, the patient was forgetting that she had taken earlier doses of Lithium which had possibly lead to her toxicity.  It is felt that the patient is safe for outpatient follow-up per Dr. Jeanie Chase.  The patient will be discharged to home and will be asked to follow up with Dr. Raynelle Highland at [facility].  #4 - Hypertension.  Fairly well controlled during this hospitalization.  As elluded to in #1, the patients Maxzide may exacerbate lithium levels as it is a diuretic.  The patients Maxzide will be discontinued and the patient will be discharged home on Norvasc for blood pressure control.  The patient has several other chronic medical problems which are stable throughout this hospitalization.  The patient will be discharged to home, will be asked to follow up and continue with her other outpatient medications. Dictated by:   [name], M.D. Attending Physician:  [name] D. DD:  03/04/02 TD:  03/06/02 Job: [id number]

## 2011-03-18 NOTE — Discharge Summary (Signed)
NAMEMICCA, MATURA             ACCOUNT NO.:  [id number]   MEDICAL RECORD NO.:  [id number]          PATIENT TYPE:  INP   LOCATION:                               FACILITY:  [facility]   PHYSICIAN:  Theone Stanley, MD   DATE OF BIRTH:  April 28, 1971   DATE OF ADMISSION:  09/16/2004  DATE OF DISCHARGE:  09/19/2004                                 DISCHARGE SUMMARY   ADDENDUM:   HOSPITAL COURSE:  Patient was going to be discharged on the 19th.  However,  she started to have episodes of emesis so her discharge was delayed after  work-up of this.  CT scan of abdomen and pelvis was negative.  Amylase and  lipase was negative.  Urine pregnancy test was negative.  Patient was  initiated on Reglan and she felt much relief.  She was able to eat without  any difficulty.  She indicated that she would like to go home today.  She  will get help from her mother.  She states that since she is not as  nauseated that she is able to eat and drink without any difficulty.  The  patient will be discharged with her previous medications noted.  In  addition, we will send her out on some Reglan 10 mg one p.o. a.c. and h.s.        ___________________________________________  Theone Stanley, MD    AEJ/MEDQ  D:  09/19/2004  T:  09/19/2004  Job:  [id number]

## 2011-03-18 NOTE — H&P (Signed)
[name], [name]             ACCOUNT NO.:  [id number]   MEDICAL RECORD NO.:  [id number]          PATIENT TYPE:  EMS   LOCATION:  ED                           FACILITY:  [facility]   PHYSICIAN:  Isidor Holts, M.D.  DATE OF BIRTH:  01-17-1971   DATE OF ADMISSION:  12/02/2004  DATE OF DISCHARGE:                                HISTORY & PHYSICAL   PRIMARY CARE PHYSICIAN:  Unassigned. Admitted by [facility] Hospitalist.   CHIEF COMPLAINT:  Lethargy for months. Vomiting/diarrhea x3 days. Urinary  frequency/dysuria x2 days.   HISTORY OF PRESENT ILLNESS:  This is a 40 year old female with known history  of bipolar disorder and depression. Apparently she presented to the  emergency department at [facility] on November 30, 2004, with the  above symptoms as well as low mood. She was evaluated, rehydrated and  subsequently transferred to the [facility] unit. She was subsequently  re-referred to the emergency department at [facility] for  hypotension, lethargy and electrolyte abnormalities. Also her TSH was found  to be approximately 25 and Synthroid dose has been increased.   PAST MEDICAL HISTORY:  1.  Hypertension.  2.  Dyslipidemia.  3.  GERD.  4.  Anxiety/depression.  5.  Bipolar disorder.  6.  Hypothyroidism.  7.  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   MEDICATIONS:  1.  Zocor 20 mg p.o. daily.  2.  K-Dur 40 mg p.o. t.i.d.  3.  Synthroid 200 mcg p.o. daily.  4.  Depakote ER 500 mg p.o. q.a.m., and 1000 mg p.o. q.h.s.  5.  Risperdal 0.5 mg p.o. q.a.m. and 0.5 mg p.o. q.h.s.  6.  Wellbutrin XR 150 mg p.o. q.a.m.  7.  Klonopin 0.5 mg p.o. t.i.d.   ALLERGIES:  CODEINE, SULFA, MORPHINE.   SOCIAL/FAMILY HISTORY:  She lives alone, single. Has one offspring. Mother  comes by to help her out. She is currently on disability.   FAMILY HISTORY:  Positive for heart disease in her father who is now  deceased.  Her mother has  hypertension.   REVIEW OF SYSTEMS:  Essentially as per HPI.  However has also had a vaginal  discharge and pruritus for approximately 1 week.   PHYSICAL EXAMINATION:  VITAL SIGNS:  Temperature 97.9, pulse 70 per minute,  regular, respiratory rate 16, blood pressure 94/62 mmHg. Pulse oximetry 100%  on room air.  GENERAL:  The patient is alert, communicative, in no obvious distress. Not  short of breath at rest.  HEENT:  No conjunctival pallor, no jaundice, no conjunctival injection.  NECK:  Supple, JVP not seen. No palpable lymphadenopathy, no palpable  carotids.  CHEST:  Clear to auscultation, no wheezes, no crackles heard.  HEART:  S1, S2 heard, normal, regular. No murmurs.  ABDOMEN:  Full, soft and nontender. No palpable organomegaly, no palpable  masses.  Normal bowel sounds.  LOWER EXTREMITY EXAM: No pitting edema. Palpable peripheral pulses.  VAGINAL EXAMINATION: The patient has a cheesy whitish discharge in the  vulvar region. High vaginal swab was done and this obtained a greenish  exudate which was  sent for microscopy and culture.   OTHER INVESTIGATIONS:  Electrolytes dated December 02, 2004: Sodium 133,  potassium 2.7, chloride 90, CO2 30, BUN 21, creatinine 1.4, glucose 73.  Liver function tests are normal. Laboratory tests dated December 01, 2004,  CBC: WBC 6200, hemoglobin 15.6, hematocrit 44.3, platelets 187,000. TSH  25.96.  Urinalysis was positive with 7-10 white blood cells and many  bacteria.   12-lead EKG: Regular, sinus rhythm, right bundle branch block pattern.   ASSESSMENT/PLAN:  1.  Increasing lethargy/anorexia. The patient is significantly dysthyroid.      However her Synthroid dosage was increased to 200 mcg daily on December 01, 2004. We shall continue this.   1.  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  Patient has had 3 days of vomiting and diarrhea prior to admission on  November 30, 2004. We shall rehydrate  with intravenous fluids and antiemetics  p.r.n. Also replete potassium, follow electrolytes and check serum amylase  and lipase levels.  We shall also obtain stool samples for Clostridium difficile toxin.   1.  Hypotension likely secondary to #2 above, will continue intravenous      fluids.   1.  Vulvo-vaginal candidiasis. Will treat with Gyne-Lotrimin (clotrimazole)      suppositories.   1.  Pelvic inflammatory disease. Will treat with Levofloxacin and      Metronidazole.   1.  Urinary tract infection. This should be adequately managed with      Levaquin.   1.  Bipolar disorder/depression. Continue psychotropic medications and      request psychiatric consultation.   Further management will depend on clinical course.      CO/MEDQ  D:  12/02/2004  T:  12/02/2004  Job:  [id number]

## 2011-03-18 NOTE — Discharge Summary (Signed)
NAMEKAVYA, HAAG             ACCOUNT NO.:  [id number]   MEDICAL RECORD NO.:  [id number]          PATIENT TYPE:  IPS   LOCATION:  0305                          FACILITY:  [facility]   PHYSICIAN:  Geoffery Lyons, M.D.      DATE OF BIRTH:  1971-03-11   DATE OF ADMISSION:  04/05/2005  DATE OF DISCHARGE:  04/08/2005                                 DISCHARGE SUMMARY   CHIEF COMPLAINT AND PRESENT ILLNESS:  This was the third admission to [facility] for this 40 year old white female separated. Endorsed  she became very tearful, could not stop crying. Was somewhat agitated in the  psychotherapist office the day before. Expressed some thoughts of wanting to  overdose on medications and was referred her therapist for hospitalization.  Endorsed that her mood was quite bad, feeling very down and depressed.  Endorsed episodes of depression with some agitated thinking, occurring in  unpredictable spells over the course of the last two to four days. Endorsed  her mood was labile. Tends to worse as she gets more depressed whenever she  is along or in unstructured time. Lives alone, has daily visitation with her  99-year-old son. Mother is supportive. Episodes of feeling anxious with some  agitated thoughts from time to time, especially in the morning and when she  is alone for any period of four to five hours.   PAST PSYCHIATRIC HISTORY:  Followed at [facility],  Dr. Hortencia Pilar. Third time at [facility], last being February 4 through  December 07, 2004. Also January 31 through December 02, 2004. History of  bipolar disorder.   ALCOHOL AND DRUG HISTORY:  Denies any active substance use.   PAST MEDICAL HISTORY:  1.  Status post total knee replacement with chronic knee pain.  2.  Dyslipidemia.  3.  Hypothyroidism.   MEDICATIONS:  1.  Zocor 20 mg daily.  2.  Wellbutrin XL 150 mg per day.  3.  Protonix 40 mg per day.  4.  Synthroid 200 mcg daily.  5.   Premarin 0.9 mcg daily.  6.  Depakote ER 500 one in the morning and two at night.  7.  Ambien 10 as needed for sleep.  8.  Klonopin 1 mg in the morning and 1 in the afternoon.  9.  Darvocet-N 100 one every four hours as needed for pain.   PHYSICAL EXAMINATION:  Physical exam performed, did not show any acute  findings.   LABORATORY DATA:  CBC:  White blood cells were 9.7, hemoglobin 13.1,  hematocrit 37.7. Blood chemistries:  Within normal limits. TSH 1.087.  Valproic acid level 37.6.   MENTAL STATUS EXAM:  Reveals a fully alert female, cooperative, calm and  appropriate. Speech was normal in pace, tone, and amount. Mood depressed.  Thought processes positive for suicidal ideas upon admission but denied any  denied any currently upon this evaluation. No delusional ideas. No homicidal  ideas. No hallucinations. Cognition well preserved.   ADMISSION DIAGNOSES:   AXIS I:  Bipolar disorder, depressed.   AXIS II:  No diagnosis.   AXIS III:  1.  Chronic knee pain.  2.  Hypothyroidism.  3.  Dyslipidemia.   AXIS IV:  Moderate.   AXIS V:  Global Assessment of Functioning upon admission 25, highest Global  Assessment of Functioning in the last year 70-75.   COURSE IN THE HOSPITAL:  She was admitted and started in individual and  group psychotherapy. She was given Zocor 20 mg per day, Wellbutrin XL 150 mg  in the morning, Protonix 40 mg in the morning, Synthroid 200 mcg daily,  Premarin 0.9 mg daily, Depakote ER 500 in the morning/1,000 at night,  Klonopin 1 mg in the morning and 1 mg at night, Risperdal 0.5 at bedtime,  trazodone 50 mg at bedtime. Depakote was increased to 500 in the morning,  1,500 at night. She was placed on Darvocet-N 100 four times a day as needed  for pain. The Risperdal was increased to 0.25 mg twice a day and 0.5 at  night. Mentally, she was able to settle down and talk about the events.  Endorsed that he her exhusband told his girlfriend that he loved her.  Got  upset when she heard that he feels that although they are separated and they  are divorced. There was still always the possibility of them getting back  together, and if he is in a relationship, there is less of a chance of that.  Endorsed that she lost control and said things that she would not have  otherwise but endorsed that she was very upset. Endorsed that being able to  talk about it has been very beneficial. Continued to settle down. She was  placed back on medication. On June 9, she was in full contact with reality.  Family session with the mother had went well. There were no suicidal ideas,  no homicidal ideas. No hallucinations or delusions. Endorsed that she was  feeling good to go. Had worked on Pharmacologist. Had done some work on the  relationship. Appears that she just needed to accept that the exhusband was  ready to move on.   DISCHARGE DIAGNOSES:   AXIS I:  Bipolar disorder, depressed.   AXIS II:  No diagnosis.   AXIS III:  1.  Chronic knee pain status post knee replacement.  2.  Hypothyroidism.  3.  Dyslipidemia.   AXIS IV:  Moderate.   AXIS V:  Global Assessment of Functioning upon discharge 50.   DISCHARGE MEDICATIONS:  1.  Zocor 20 mg per day.  2.  Wellbutrin XL 150 in the morning.  3.  Protonix 40 mg in the morning.  4.  Synthroid 200 mcg daily.  5.  Depakote ER 500 in the morning and 1,500 at bedtime.  6.  Klonopin 1 mg twice a day.  7.  Risperdal 0.5 at night.  8.  Premarin 0.9 one daily.  9.  Trazodone 50 mg at bedtime.  10. Risperdal 0.25 twice a day.   FOLLOW UP:  _____________________       IL/MEDQ  D:  05/02/2005  T:  05/03/2005  Job:  [id number]

## 2011-03-18 NOTE — Op Note (Signed)
NAME:  BRANDAN, GLAUBER                       ACCOUNT NO.:  [id number]   MEDICAL RECORD NO.:  [id number]                   PATIENT TYPE:  INP   LOCATION:  9399                                 FACILITY:  [facility]   PHYSICIAN:  Miguel Aschoff, M.D.                    DATE OF BIRTH:  12-Jan-1971   DATE OF PROCEDURE:  08/14/2003  DATE OF DISCHARGE:                                 OPERATIVE REPORT   PREOPERATIVE DIAGNOSIS:  Chronic pelvic pain.   POSTOPERATIVE DIAGNOSES:  1. Chronic pelvic pain.  2. Pelvic adhesions.   PROCEDURE:  Total abdominal hysterectomy, bilateral salpingo-oophorectomy,  lysis of adhesions.   SURGEON:  Miguel Aschoff, M.D.   ASSISTANT:  Luvenia Redden, M.D.   ANESTHESIA:  General anesthesia.   COMPLICATIONS:  None.   INDICATIONS FOR PROCEDURE:  The patient is 40 year old white female with  history of chronic pelvic pain who has undergone multiple evaluations with  ultrasound and treatments including Depo-Lupron therapy.  The patient did  have significant improvement of pain on Depo-Lupron but the pain has now  returned and she now requested that a procedure be carried out to eliminate  the pelvic pain.  She is now being taken to the operating room to undergo  total abdominal hysterectomy with bilateral salpingo-oophorectomy.  The  risks and benefits of the procedure were discussed with the patient.  The  patient understands she will require hormone replacement therapy after  surgery.   DESCRIPTION OF PROCEDURE:  The patient was taken to the operating room and  placed in the supine position.  General anesthesia was administered without  difficulty.  She was then prepped and draped in the usual sterile fashion.  A Foley catheter was inserted.  Her previous Pfannenstiel incision was then  reincised and extended down through subcutaneous tissue.  Bleeding points  were clamped and coagulated as they were encountered.  The fascia was then  identified and incised  transversely.  The fascia was then separated from the  underlying rectus muscles.  The rectus muscles were divided in the midline.  Peritoneum was then identified and entered carefully avoiding underlying  structures.  The peritoneal incision was then extended under direct  visualization.  It was obvious the patient had multiple anterior omental  adhesions on the abdominal wall secondary to prior C-section.  These  adhesions were placed on stretch and taken down without difficulty and with  good hemostasis.  The anatomy is now restored to normal configuration.  At  this point, retractor was placed through the wound.  The viscera packed out  of the pelvis.  Inspection showed the uterus to be normal size and shape.  Cul-de-sac was then remarkable.  Tubes and ovaries were inspected and  appeared to be within normal limits.  At this point, the round ligaments  were identified, clamped, cut and suture ligated using suture ligatures of 0  Vicryl.  A bladder flap was then created anteriorly.  Her abrasions were  then made below the utero-ovarian ligament, then the infundibulopelvic  ligament was found.  Ureter identified and the infundibulopelvic ligaments  were clamped with curved Heaney clamps.  These pedicles were cut and then  doubly ligated using two ligatures of 0 Vicryl.  Broad ligament was then  skeletonized.  The uterine vessels found, clamped with curved Heaney clamps  and these pedicles were suture ligated using suture ligatures of 0 Vicryl.  Then serial bites were taken of the cervical fascia using straight Heaney  clamps.  All pedicles were suture ligated using suture ligatures of 0  Vicryl. The uterosacral ligaments were then found, clamped with curved  Heaney clamps and suture ligated using suture ligatures of 0 Vicryl.  Similarly, the cardinal ligaments were clamped, cut, and suture ligated.  At  this point, it was possible to place any curved Heaney clamps across the  reflection  of the vagina into the cervix and then the cervix, the uterus,  tubes and ovaries were excised.  The vaginal cuff was then closed using  Heaney sutures of 0 Vicryl.  Uterosacral ligaments were incorporated to  support the apex of the vagina.  The cuff was then further closed using  interrupted 0 Vicryl sutures.  Inspection was made for hemostasis.  At this  point, hemostasis appeared to be excellent.  No other abnormalities were  noted in the abdomen or pelvis.  At this point, lap counts were taken and  found to be correct.  The abdomen was then closed.  The parietal peritoneum  was closed using a running continuous 0 Vicryl suture. Rectus muscles were  reapproximated using running continuous 0 Vicryl suture.  The patch was then  closed using two sutures of 0 Vicryl each side with lateral fascial angles  and meeting in the midline.  Subcutaneous tissues closed using interrupted 0  Vicryl sutures and the skin was closed using staples.   ESTIMATED BLOOD LOSS:  Approximately 200 mL.   DISPOSITION:  The patient tolerated the procedure well and went to the  recovery room in satisfactory condition.                                               Miguel Aschoff, M.D.    AR/MEDQ  D:  08/14/2003  T:  08/14/2003  Job:  [id number]

## 2011-03-18 NOTE — Discharge Summary (Signed)
NAMEEVERLEY, EVORA             ACCOUNT NO.:  [id number]   MEDICAL RECORD NO.:  [id number]          PATIENT TYPE:  IPS   LOCATION:  0406                          FACILITY:  [facility]   PHYSICIAN:  Geoffery Lyons, M.D.      DATE OF BIRTH:  11/04/1970   DATE OF ADMISSION:  12/04/2004  DATE OF DISCHARGE:  12/07/2004                                 DISCHARGE SUMMARY   CHIEF COMPLAINT AND PRESENT ILLNESS:  This was the first admission to [facility] for this 40 year old separated white female.  Admitted to [facility] on November 30, 2004 complaining of depressive  symptoms, hallucinations and little self-neglect.  Transferred to [facility] on December 02, 2004 because of hypotension, lethargy and electrolytes  abnormalities.  Evaluated by the consulting psychiatrist, Dr. Jeanie Sewer, on  December 03, 2004 and recommended that she be readmitted to the [facility] for further stabilization.  Upon admission, she had electrolytes  abnormalities and TSH was found to be approximately 25.  Given IV fluids and  treated for vulvovaginal candidiasis.  She was recommended for readmission  due to the anorexia and self-neglect.   PAST PSYCHIATRIC HISTORY:  Had been inpatient in [facility].  She has been followed up on an outpatient basis.   ALCOHOL/DRUG HISTORY:  Denies the use or abuse of any substances.   MEDICAL HISTORY:  Vulvovaginal candidiasis, anorexia, status post knee  replacement.   MENTAL STATUS EXAM:  Alert, cooperative female.  Hair is thinning.  Speech  was normal in rate, rhythm and tone.  Judgment and insight were fair.  Somewhat insightful.  No evidence of delusions.  No hallucinations.  Cognition was well-preserved.   ADMISSION DIAGNOSES:   AXIS I:  Bipolar disorder.   AXIS II:  No diagnosis.   AXIS III:  1.  Hypotension.  2.  Hypothyroidism.  3.  Gastroesophageal reflux.  4.  Dyslipidemia.  5.  Status post right knee  arthroplasty.  6.  Electrolytes abnormality secondary to anorexia.  7.  Vulvovaginal candidiasis.   AXIS IV:  Moderate.   AXIS V:  Global Assessment of Functioning upon admission 30; highest Global  Assessment of Functioning in the last year 65-70.   HOSPITAL COURSE:  She was admitted and started in individual and group  psychotherapy.  She was given Ambien for sleep.  She was maintained on  potassium 40 mEq daily, Zocor 20 mg daily, Synthroid 200 mcg daily, Depakote  500 mg in the morning and 1000 mg at night, Wellbutrin XL 150 mg daily,  Risperdal 0.5 mg twice a day, Protonix 40 mg daily, Premarin 0.9 mg daily,  Reglan 10 mg before meals as needed.  She did endorse that she went off her  medication.  Aware that she had a problem with her electrolytes, her  potassium and her thyroid.  Completely lost her appetite.  Lost 60 pounds.  Separated from the husband.  Will get a divorce.  Otherwise, denies any  stressors.  She was wanting to go home.  She was feeling much better.  Her  appetite was  coming back. Her eating habits were getting normalized.  Session with her mother was carried on February 6th.  She endorsed that she  wanted to go home and the  mother was going to stay with her to ensure  medication compliance and that she eats properly.  Her sister was also going  to be involved.  She stated that she wanted her life back and she was  willing to accept the help from her family.  On February 7th, she was in  full contact with reality.  There were no suicidal ideation, no homicidal  ideation, no hallucinations, no delusions.  Feeling much better.  Endorsed  she went off her medication.  She was eating less, weight loss and failure  to thrive, became anorexic.  Completely overwhelmed and depressed.  Starting  to feel better.  Her mood was back to normal.  Affect was bright, broad.  No  suicidal or homicidal ideation.  Committed to doing well.   DISCHARGE DIAGNOSES:   AXIS I:   Bipolar disorder, depressed.   AXIS II:  No diagnosis.   AXIS III:  1.  Hypertension.  2.  Gastroesophageal reflux.  3.  Hypothyroidism.  4.  Dyslipidemia.  5.  Status post right knee arthroplasty.  6.  Metabolic sequela of anorexia with hypokalemia, corrected.   AXIS IV:  Moderate.   AXIS V:  Global Assessment of Functioning upon discharge 60.   DISCHARGE MEDICATIONS:  1.  K-Dur 20 mEq, 2 daily.  2.  Zocor 20 mg daily.  3.  Wellbutrin XL 150 mg daily.  4.  Risperdal 0.5 mg twice a day.  5.  Protonix 40 mg daily.  6.  Synthroid 200 mcg daily.  7.  Premarin 0.9 mg daily.  8.  Depakote ER 500 mg in the morning and 1000 mg at night.  9.  Ambien 10 mg at bedtime for sleep.   FOLLOW UP:  Dr. Lang Snow and Claris Gladden and Dr. Emeline Darling for her medical  conditions.      IL/MEDQ  D:  12/31/2004  T:  01/01/2005  Job:  [id number]

## 2011-03-18 NOTE — Discharge Summary (Signed)
NAME:  [name], [name]                       ACCOUNT NO.:  [id number]   MEDICAL RECORD NO.:  [id number]                   PATIENT TYPE:  INP   LOCATION:  4702                                 FACILITY:  [facility]   PHYSICIAN:  Sherin Quarry, MD                   DATE OF BIRTH:  [birth year]   DATE OF ADMISSION:  02/15/2003  DATE OF DISCHARGE:  02/19/2003                                 DISCHARGE SUMMARY   HISTORY OF PRESENT ILLNESS:  The patient is a 40 year old lady who initially  presented on April 16 with a two-week history of vomiting and diarrhea.  About 10 days prior to admission, she was seen at [facility] and was given  Phenergan for her vomiting symptoms.  Four days prior to admission, the  patient was treated for nausea and vomiting with intravenous fluids.  Since  that time, she had experienced a progressive lethargy.   PAST MEDICAL HISTORY:  1. Bipolar disorder.  2. Previous history of Lithium toxicity.   PHYSICAL EXAMINATION ON ADMISSION:  VITAL SIGNS:  Blood pressure 130/95,  pulse 92, respirations 22, temperature 96.1.  GENERAL:  The patient was lethargic but arousable.  She denied pain.  HEENT:  According to Dr. Jonell Chase, the exam was within normal limits.  CHEST:  Clear.  CARDIOVASCULAR:  Normal S1 and S2 without rubs, murmurs, or gallops.  ABDOMEN:  Benign.  NEUROLOGIC:  Normal.  EXTREMITIES:  Normal.   ADMISSION LABORATORY DATA:  Urinalysis was normal.  Stool cultures are  negative to date.  Initial Lithium level was 3.82, normal range being 0.8 to  1.4.  Urine pregnancy test was negative.  The patient was noted to have a  significant degree of renal impairment.  The creatinine was 3.2, BUN 50,  potassium 2.7, glucose 133.   HOSPITAL COURSE:  Therefore, the patient was admitted with a working  diagnosis of Lithium toxicity with associated renal dysfunction.  Lithium  was withheld, and the patient was given an IV of normal saline at 150 ml per  hour.   Potassium was replaced by the oral and intravenous routes.  The  lithium level was carefully monitored.  By April 20, the Lithium level was  down to 2.06.  On April 21, the Lithium level was down to 1.43.  At that  time, the patient's BMET showed a potassium of 3.1 and a creatinine of 1.0,  suggesting that the renal toxicity from the Lithium had resolved.   On April 20, the patient was seen in consultation by Dr. Jeanie Sewer of the  psychiatry service.  He felt that the patient was experiencing bipolar  disorder and that her mental confusion was secondary to the previously  documented Lithium toxicity.  He recommended that, since the patient had so  much difficulty with Lithium problems, it might be better to treat her with  Depakote.  He recommended Depakote ER 500 mg at  bedtime daily.  He  recommended checking a CBC and liver profile two times in the first month on  this medication, then again at three months and six months.  He did not  think that inpatient psychiatric care was indicated and recommended followup  with outpatient psychiatry.   Since on April 21 the patient was no longer Lithium toxic, it was felt  reasonable to discharge her.    DISCHARGE DIAGNOSES:  1. Lithium toxicity with associated renal dysfunction.  2. Bipolar disorder.  3. Acute gastroenteritis.  4. Hypothyroidism.  5. History of hypertension.   DISCHARGE MEDICATIONS:  1. Effexor 150 mg p.o. b.i.d.  2. Klonopin 1 mg b.i.d.  3. Synthroid 200 mcg daily.  4. Protonix 40 mg daily.  5. Depakote 500 mg at bedtime daily.  6. Zocor 20 mg daily.  7. Trazodone 150 mg at bedtime p.r.n. for sleep.   FOLLOW UP:  The patient will follow up with [facility] with the [facility] as indicated.                                               Sherin Quarry, MD    SY/MEDQ  D:  02/19/2003  T:  02/20/2003  Job:  [id number]   cc:   [facility]   Antonietta Breach, M.D.  [address]  [place], [place] [address]  Fax: [phone]   [facility]

## 2011-03-18 NOTE — H&P (Signed)
NAME:  [name], [name]                       ACCOUNT NO.:  [id number]   MEDICAL RECORD NO.:  [id number]                   PATIENT TYPE:  OBV   LOCATION:  4707                                 FACILITY:  [facility]   PHYSICIAN:  Melissa L. Ladona Ridgel, MD               DATE OF BIRTH:  02-Mar-1971   DATE OF ADMISSION:  01/02/2004  DATE OF DISCHARGE:  01/03/2004                                HISTORY & PHYSICAL   PRIMARY CARE PHYSICIAN:  Denies Emeline Darling, M.D., with [facility].   CHIEF COMPLAINT:  Chest pain.   HISTORY OF PRESENT ILLNESS:  The patient is a 40 year old white female who  developed nausea earlier on the evening of admission.  She states I felt  gassy.  She states she had a sensation of pressure or heaviness for which  she took a gas pill that did not provide any relief.  The pain went into  her neck.  Symptoms felt better with lying on her side.  The pain is now  6/10 with associated headache and a sensation of needing to swallow.   ALLERGIES:  1. CODEINE.  2. SULFA.   MEDICATIONS:  1. Hydrochlorothiazide 25 mg daily.  2. Depakote 1000 mg p.o. b.i.d.  3. Trazodone 300 mg p.o. q.h.s.  4. Effexor 150 mg p.o. b.i.d.  5. Protonix 40 mg daily.  6. Clonazepam 1 mg p.o. b.i.d.  7. Zocor 20 mg p.o. daily.  8. Levothroid 0.15 mg daily.  9. Gemfibrozil 600 mg p.o. b.i.d.  10.      Premarin 0.625 mg p.o. daily.   PAST MEDICAL HISTORY:  1. Increased blood pressure.  2. Increased cholesterol.  3. Bipolar disease.   PAST SURGICAL HISTORY:  1. Hysterectomy in October.  2. Gallbladder removal.  3. Appendectomy.  4. Tonsillectomy.   SOCIAL HISTORY:  She smokes a half a pack a day.  She denies ethanol.  She  denies illicit drug use.   FAMILY HISTORY:  Emelia Loron is deceased secondary to coronary artery  disease.  Father is deceased secondary to coronary artery disease.  Mom is  living with hypertension.  She has one sister who is okay.   REVIEW OF SYMPTOMS:  No dysuria, no  constipation, no diarrhea, no melena, no  hematochezia, no dyspnea on exertion, no PND, no orthopnea.  All else  negative.   PHYSICAL EXAMINATION:  GENERAL APPEARANCE:  She is well-developed, well-  nourished white female in no acute distress.  VITAL SIGNS:  Blood pressure 100/58, pulse 92, respiratory rate 20,  temperature 97.2, with 100% saturation on room air.  HEENT:  She is normocephalic and atraumatic.  Pupils are equal, round and  reactive to light. Extraocular muscles are intact.  Moist mucous membranes.  NECK:  Supple.  There is no JVD, no lymph nodes, no carotid bruits.  CHEST:  Clear to auscultation.  No wheezing, rhonchi or rales are present.  CARDIOVASCULAR:  Regular rate and rhythm,  positive S1 and S2, no S3 or S4,  no murmurs, rubs, or gallops.  ABDOMEN:  Soft, nontender and nondistended with positive bowel sounds.  No  hepatosplenomegaly.  EXTREMITIES:  No edema and she has 2+ pulses.  NEUROLOGIC:  She is awake, alert and oriented x3.  Cranial nerves II-XII  grossly intact.  She is otherwise nonfocal.   LABORATORY DATA:  Sodium 138, potassium 3.5, chloride 113, CO2 40.9, BUN 22,  creatinine 0.9, glucose 98.   Her EKG shows an intraventricular conduction delay with normal sinus rhythm,  no acute STT wave changes are present.   Her chest x-ray shows no acute disease.   Her point of care cardiac enzymes were negative x3.   ASSESSMENT/PLAN:  This is a 40 year old white female with past medical  history for increased cholesterol, hypertension, who presents with several  hours of chest pressure radiating to her neck, associated with nausea.  The  pain is somewhat relieved by nitroglycerin drip.   1. Cardiovascular:  Will continue her aspirin, beta-blocker and     nitroglycerin drip and request a cardiology consult to determine if     stress testing is indicated.  Will continue her Zocor.  Will check a TSH     and D-dimer to rule out a pulmonary source for her chest  pain.  To be NPO     for now and continue the nitroglycerin drip.  2. Pulmonary:  She has no active disease on chest x-ray.  She is saturating     well on room air.  There are no active issues.  3. Gastrointestinal:  Will continue her Protonix.  4. Genitourinary:  There are no current complaints.  5. Endocrine:  Will check a TSH.  6. Psychiatric:  She currently claims to have a stable bipolar disease.     Will check a Depakote level and continue her current medications.                                                Melissa L. Ladona Ridgel, MD    MLT/MEDQ  D:  03/11/2004  T:  03/11/2004  Job:  [id number]   cc:   Duke Salvia, M.D.  [facility]

## 2011-03-18 NOTE — H&P (Signed)
[name], [name]             ACCOUNT NO.:  [id number]   MEDICAL RECORD NO.:  [id number]          PATIENT TYPE:  EMS   LOCATION:  MAJO                         FACILITY:  [facility]   PHYSICIAN:  Jackie Plum, M.D.DATE OF BIRTH:  May 29, 1971   DATE OF ADMISSION:  09/15/2004  DATE OF DISCHARGE:                                HISTORY & PHYSICAL   CHIEF COMPLAINT:  Confusion with poor oral intake.   HISTORY OF PRESENT ILLNESS:  The patient was brought into the hospital by  family from [facility] today on account of above complaints.  Apparently the patient had a knee replacement surgery involving the right  lower extremity about five weeks ago.  She was said to have been anorexic  with decrease appetite and p.o. intake as well as increased sleepiness, not  wanting to do anything, and intermittent confusion.  There is no history of  fever, chills, shortness of breath, chest pain, palpitations, cough, sputum  production, abdominal pain, vomiting, or diarrhea but admits to nausea.  There is no history of dysuria or frequency micturition, any skin rash.  She  did not have any difficulty walking or with speech.  In the emergency room  on arrival, the patient was hemodynamically stable.  She was said to have  very dry mucous membranes with cracked lips and her cardiopulmonary exam was  unremarkable.  Lab work reviewed includes a creatinine from 1.1, on August 23, 2004, to a creatinine of 1.9 today with a corresponding increase of BUN  from 14, on August 23, 2004, to a BUN of 41 today.  She was noted to have a  depressed facies and UA was notable for pyuria with moderate leukocyte  esterase and negative nitrite, and WBCs of 11-20 with many bacteria (there  were many urine epithelial cells though).  CBC was negative for any  leukocytosis.  __________  for admission.   PAST MEDICAL HISTORY:  1.  History of hypertension.  2.  Dyslipidemia.  3.  Gastroesophageal reflux  disease.  4.  Anxiety with depression.  5.  Bipolar disorder.  6.  Hypothyroidism.  7.  Anemia.   As mentioned above the patient recently had a right total knee replacement  secondary to degenerative joint disease on August 17, 2004.   ALLERGIES:  1.  CODEINE.  2.  SULFA MEDICINES.   CURRENT MEDICATIONS:  1.  Depakote 500 mg p.o. t.i.d.  2.  Wellbutrin 150 mg p.o. b.i.d.  3.  Levothroid 125 mcg daily.  4.  Gemfibrozil 600 mg b.i.d.  5.  Protonix 40 mg every day.  6.  HCTZ 25 mg every day.  7.  Zocor 20 mg every day.  8.  Temazepam 1 mg b.i.d.  9.  Oxycodone p.r.n.   FAMILY HISTORY:  Positive for heart disease in grandfather and father, both  are deceased.  Her Mom is still alive.  She has hypertension.   SOCIAL HISTORY:  The patient smokes half a pack of cigarettes a daily.  Does  not drink alcohol.  Does not use any illicit drugs.  She lives with her  mother.  She is currently unemployed.  Has one child.  She is not married.   REVIEW OF SYSTEMS:  The patient denies any abdominal pain, constipation,  hematemesis, hematochezia, PND, orthopnea, __________  but admits to  generalized pain with fatigue and weakness.   PHYSICAL EXAMINATION:  VITAL SIGNS:  Temperature 99.9 degrees Fahrenheit  initially, was down to 98.0 degrees Fahrenheit at the time of my evaluation  with a BP of 99/59, pulse 74, respirations 18, O2 saturation of 94% on room  air.  GENERAL:  The patient was not in acute cardiopulmonary distress; however,  she was very tearful with depressed facies.  HEENT:  Normocephalic, atraumatic.  Pupils were equal, round, reactive to  light.  Extraocular movements were intact.  Oropharynx was dry.  There was  oropharyngeal exudation or erythema.  NECK:  Supple.  No JVD.  CNS:  The patient was alert, but mildly confused.  She was oriented x 2.  Brudzinski and Kernig signs were negative.  There was no obvious acute  neurologic deficit.  LUNGS:  Clear to auscultation  bilaterally.  CARDIAC:  Regular rate and rhythm without any gallops or murmur.  ABDOMEN:  Soft, nontender.  Bowel sounds were present, active .  EXTREMITIES:  No edema.  No cyanosis.   LAB WORK:  CT scan of the head, done in the ED, was said to be negative for  any acute neurologic deficit.  Official report still pending and will be  followed up.   UA as stated above in HPI.  Creatinine was 1.9 on I-STAT.  I-STAT sodium of  135, potassium of 2.3, chloride of 97, BUN of 41, glucose 89.  PH 7.53, pCO2  38.8, bicarb of 32.4.  WBC count of 6.8, hemoglobin 15.2, hematocrit 33.0,  MCV 88.4, platelet count 173.  Total protein is 6.3, albumin 3.3, AST 15,  ALT 11, alkaline phosphatase 48, bilirubin 0.7, valproic acid level 51.0.   IMPRESSION:  1.  Altered mental status change.  2.  Dehydration with acute renal failure secondarily.  3.  Hypokalemia.  4.  Severe depression.   [name] presentation is quite worrisome for primarily underlying  psychiatric problem with depression with subsequent decreased p.o. intake  and dehydration with this accompanying altered mental status change.  The  patient will be admitted for IV fluid and electrolytes repletion and we will  follow her renal and electrolytes panel in the morning.  She will need to be  seen as soon as possible by psychiatrist in the morning.  We will continue  her psychiatric medications.  We will obtain other tests including TSH and  CPK.  The patient's UA indicates a lot of epithelial cells, however, with  her pyuria we will give her a dose of Rocephin and repeat her UA with urine  cultures.  She does not look like she has any urosepsis at this point.  Blood cultures were taken at the ED and will need to be  followed up.  Official CT report need to be checked as well.  We will also  check a magnesium and phosphorous.  Hypokalemia is likely due to decreased p.o. intake.  However, we will do the magnesium and phosphorous for   completeness sake.  Further recommendations will be stated as the database  expands.      Geor   GO/MEDQ  D:  09/16/2004  T:  09/16/2004  Job:  [id number]   cc:   Liliane Channel, MD

## 2011-03-18 NOTE — Op Note (Signed)
NAMECLATIE, KESSEN             ACCOUNT NO.:  [id number]   MEDICAL RECORD NO.:  [id number]          PATIENT TYPE:  AMB   LOCATION:  NESC                         FACILITY:  [facility]   PHYSICIAN:  Deidre Ala, M.D.    DATE OF BIRTH:  1970/11/01   DATE OF PROCEDURE:  07/11/2006  DATE OF DISCHARGE:                                 OPERATIVE REPORT   PREOPERATIVE DIAGNOSIS:  1. Left elbow cubital tunnel syndrome.  2. Medial elbow epicondylitis.   POSTOPERATIVE DIAGNOSIS:  1. Left elbow cubital tunnel syndrome.  2. Medial elbow epicondylitis.   PROCEDURE:  1. Left elbow ulnar nerve neurolysis.  2. Left elbow medial epicondylectomy with repair Jobe procedure.   SURGEON:  1. Charlesetta Shanks, M.D.   ASSISTANT:  Clarene Reamer, [facility]   ANESTHESIA:  General endotracheal.   CULTURES:  None.   DRAINS:  None.   BLOOD LOSS:  Minimal.   TOURNIQUET TIME:  45 minutes.   PATHOLOGIC FINDINGS AND HISTORY:  [name] has had a several-month history of  numbness in the left fourth and fifth fingers.  She has had conservative  management with splinting an injection. She had been tender over the medial  epicondyle.  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 There was a mild median mononeuropathy but she was not particularly  symptomatic so we did not address that.  She was tender over the medial  epicondyle with a rather prominent medial epicondyle.  She had a positive  elbow flexion wrist flexion test.  At surgery the nerve was constricted with  several bands at the cubital tunnel that were released.  We did not  excessively dissect the nerve.  We did completely neurolyse it from proximal  musculature to distal with release of the medial intermuscular septum.  I  did do a medial epicondylectomy. The epicondyle was rather prominent,  catching the nerve and flexion.  It did not do so afterward. We did  repair  the common flexor mass back to soft tissue with good soft tissue anchoring  at closure and with the elbow flexion the nerve did not catch around and was  relaxed around the medial epicondyle and the cubital tunnel.   PROCEDURE:  With adequate anesthesia obtained using LMA technique.  1 gram  Ancef given IV prophylaxis.  The patient was placed in the supine position.  The left upper extremity was prepped from the tourniquet to the fingertips  in the standard fashion.  After standard prepping and draping, Esmarch  examination was used. The tourniquet was let up to 250 mmHg.  Hockey stick  medial incision was then made over the medial epicondyle. Incision deepened  sharply with knife.  Hemostasis obtained using the Bovie electrocoagulator.  Dissection was carried down with care to protect and preserve the medial  antebrachial cutaneous nerve branches, dissection was carried down to the  cubital tunnel and this was released with scissors sharply under loupe  magnification.  I then neurolysed the nerve proximally into the musculature  and distally with  all traversing bands released and the partial aponeurotomy  carried out.  I then released the medial intermuscular septum so there was  no compression on the nerve in flexion.  I then dissected the common flexor  mass origin leaving a soft tissue cuff.  I then used an osteotome to remove  the medial epicondyle to a flat surface with multiple drilling to stimulate  neovascularity.  I then repaired the common flexor mass back to the soft  tissue with using a 0-0 Vicryl running and interrupted suture.  This was  done with wrist and elbow flexion to obtain a good repair.  The wound was  then closed in layers with 0, 2-0 and 3-0 Vicryl and 3-0 Monocryl on the  skin, 0.5% Marcaine plain was injected in the skin edges.  A bulky sterile  compressive dressing was applied soft without plaster with a sling.  The  patient having tolerated procedure  well was awakened, taken to recovery room  in satisfactory condition to be discharged per outpatient routine, given  Vicodin for pain and told to call the office for approximately for recheck  on Friday.           ______________________________  V. Charlesetta Shanks, M.D.     VEP/MEDQ  D:  07/11/2006  T:  07/11/2006  Job:  [id number]   cc:   [facility]

## 2011-03-18 NOTE — Consult Note (Signed)
[name], [name]             ACCOUNT NO.:  [id number]   MEDICAL RECORD NO.:  [id number]          PATIENT TYPE:  WOC   LOCATION:  WOC                          FACILITY:  [facility]   PHYSICIAN:  [name], MDDATE OF BIRTH:  01/13/1971   DATE OF CONSULTATION:  12/01/2004  DATE OF DISCHARGE:                                   CONSULTATION   REASON FOR CONSULTATION:  1.  Renal insufficiency.  2.  Hypokalemia.   IMPRESSIONS AND RECOMMENDATIONS:  1.  Hypokalemia, recurrent and chronic, worsened by hydrochlorothiazide and      malnutrition:  I will increase the potassium to 40 mEq p.o. t.i.d. for      three days and monitor her levels.   1.  Renal insufficiency, acute on chronic.  I recommend stopping her      hydrochlorothiazide indefinitely as this has been a recurring problem      for her.  She has had IV fluids in the emergency room and her azotemia      has improved.  Adequate treatment of her hypothyroidism should help as      well.  Hopefully she will eat and drink more once her psychiatric and      medical issues are adequately addressed.   1.  Hypothyroidism:  TSH is elevated at 25.  The patient reports compliance      with her Synthroid but I question the reliability of her history.  I      will, however, increase her Synthroid to 200 mcg daily.  I feel that      many of her problems both medical and psychiatric are related to the      inadequately treated hypothyroidism.   1.  Bipolar disorder per Dr. Dub Mikes.   1.  Weight loss/anorexia:  I suspect this is secondary to her depression.   1.  Dyslipidemia.   HISTORY OF PRESENT ILLNESS:  [name] is a 40 year old white female with  bipolar disorder who was brought to [facility] by her mother  because she has not been eating or drinking and she has been staying in bed.  The patient is currently unable or unwilling to give much history.  She  denies any abdominal pain currently.  Apparently she has  lost much weight.   PAST MEDICAL HISTORY:  As above.   MEDICATIONS ACCORDING TO THE CHART:  1.  Synthroid 175 mcg daily.  2.  Depakote ER 500 mg t.i.d.  3.  Hydrochlorothiazide 25 mg daily.  4.  Wellbutrin SR 150 mg b.i.d.  5.  Premarin 0.9 mg daily.  6.  Klonopin 1 mg twice a day.  7.  Gemfibrozil 600 mg two daily.  8.  Potassium chloride daily.  9.  Trazodone 50 mg three times a day.  10. Zocor 20 mg a day.  11. Protonix 40 mg a day.   SOCIAL HISTORY:  The patient denies drinking, smoking or drugs.   FAMILY HISTORY:  Noncontributory.   REVIEW OF SYSTEMS:  Negative but the patient is not terribly verbal at this  time so I cannot get a  full systems review.  Otherwise, please see HPI.   PHYSICAL EXAMINATION:  VITAL SIGNS:  Temperature is 97.3, pulse 90,  respiratory rate 16, blood pressure 96/64.  GENERAL:  The patient appears well nourished.  She is lying in bed in no  distress.  HEENT:  Normocephalic, atraumatic.  Pupils are equal, round and reactive to  light.  Sclera nonicteric.  Moist mucous membranes.  Neck is supple, no  lymphadenopathy.  LUNGS:  Clear to auscultation bilaterally without wheezes, rhonchi or rales.  CARDIOVASCULAR:  Regular rate and rhythm without murmurs, gallops or rubs.  ABDOMEN:  Normal bowel sounds, soft, nontender, nondistended.  GU AND RECTAL:  Deferred.  EXTREMITIES:  No clubbing, cyanosis or edema.  SKIN:  No rash.  NEUROLOGIC:  Alert and oriented.  Cranial nerves and sensory motor exam are  grossly intact.  PSYCHIATRIC:  The patient has a sad affect.  Her voice is quiet.  She has  poor eye contact.  She is cooperative.   LABORATORY:  Her sodium is 131, potassium 2.2, chloride 87, bicarbonate 33,  glucose 73, BUN 26, creatinine 1.5, calcium 9.6.  Yesterday her BUN was 39  and her creatinine was 2.2.  Her potassium yesterday was 2.3.  Acetaminophen  level less than 10, salicylate level less than 4, Depakote level 74.  Urine  drug screen  positive for benzodiazepines.  Blood alcohol less than 5.  UA  shows many epithelial cells, negative nitrites, negative leukocyte esterase,  many bacteria.  TSH is 25.962, total thyroxine is 6.2, free T3 is 0.9,  vitamin B12 is 1051.  RPR nonreactive.   ASSESSMENT AND PLAN:  As above.  I will follow along until the patient is  medically stabilized.  At this point I do not feel that she needs transfer  to the emergency room.  I feel that this can be managed safely here at  [facility] as the patient's vital signs are stable and she is able to  take pills.  Please note, that the UA is contaminated with epithelial cells  and does not need to be treated or necessary repeated unless the patient has  symptoms of dysuria.      CLS/MEDQ  D:  12/01/2004  T:  12/01/2004  Job:  [id number]

## 2011-03-18 NOTE — Discharge Summary (Signed)
NAMEBRONDA, ALFRED             ACCOUNT NO.:  [id number]   MEDICAL RECORD NO.:  [id number]          PATIENT TYPE:  INP   LOCATION:  0347                         FACILITY:  [facility]   PHYSICIAN:  Corinna L. Lendell Caprice, MDDATE OF BIRTH:  09-29-1971   DATE OF ADMISSION:  12/02/2004  DATE OF DISCHARGE:  12/04/2004                                 DISCHARGE SUMMARY   TRANSFER SUMMARY:   DIAGNOSES:  1.  Transient hypotension.  2.  Resolved renal insufficiency due to prerenal azotemia.  3.  Resolved hypokalemia.  4.  Hypothyroidism.  5.  Bipolar disease, depressed with anorexia resulting in a 90 pound weight      loss over three months.  6.  Thrush.  7.  Candidal vaginitis.  8.  Dyslipidemia.   MEDICATIONS AT THE TIME OF TRANSFER:  1.  Potassium 40 mEq p.o. daily.  2.  Zocor 20 mg a day.  3.  Synthroid 200 mcg a day.  4.  Depakote 500 mg in the morning, 1000 mg in the evening.  5.  Wellbutrin XL 150 mg a day.  6.  Risperdal 0.5 mg p.o. b.i.d.  7.  Protonix 40 mg a day.  8.  Multivitamin a day.  9.  Premarin 0.9 mg a day.  10. Chocolate Ensure t.i.d.  11. Reglan 10 mg p.o. q.a.c. p.r.n. nausea.   PERTINENT LABORATORIES:  On admission, her CBC was unremarkable except for a  platelet count of 134,000.  Her potassium was 2.7, BUN was 21, creatinine  was 1.4.  At discharge, her potassium was 3.9.  Her BUN was 6 and her  creatinine is 0.9.   CONSULTATIONS:  Dr. Jeanie Sewer.   PROCEDURE:  None.   DIET:  Should be regular with Ensure supplementation.   CONDITION ON DISCHARGE:  Stable.   HISTORY AND HOSPITAL COURSE:  Ms. Paquette is a 40 year old white female who  had been at behavioral health center being treated for depression.  I had  served as a Research scientist (medical) at that time due to renal insufficiency and  hypokalemia.  She was also found to be hypothyroid with inadequate  replacement.  Her TSH had been 25 on 175 mcg a day.  The patient reportedly  dropped her pressure to 80.  I am not  certain that she was terribly  symptomatic with this.  Nevertheless, she was admitted to the [facility] initially to the [facility] and subsequently transferred to  my service.  Calorie count was undertaken.  Her potassium was repleted.  She  was given vigorous IV hydration and psychiatry was consulted.  Her potassium  was adequately repleted and her renal insufficiency resolved with IV  hydration.  At the time of discharge, she was eating 100% of her meals,  ambulating without any dizziness and had stable vital signs.   Dr. Jeanie Sewer was consulted and felt that the patient was still at risk for  self harm and that her judgment was impaired.  At this time, the patient  agrees to go to behavioral health center voluntarily but if she for some  reason, changes her mind, she  will require commitment.  I have signed these  papers and they are on the chart.  Dr. Jeanie Sewer agrees that if voluntary  admission is refused, that the patient will need involuntary commitment.   The patient also had signs of vaginal yeast infection as well as thrush.  These were treated and require no further treatment.  Total time on the day  of discharge is 40 minutes.      CLS/MEDQ  D:  12/04/2004  T:  12/04/2004  Job:  [id number]

## 2011-03-18 NOTE — Discharge Summary (Signed)
[name], [name]             ACCOUNT NO.:  [id number]   MEDICAL RECORD NO.:  [id number]          PATIENT TYPE:  INP   LOCATION:  5038                         FACILITY:  [facility]   PHYSICIAN:  Deidre Ala, M.D.    DATE OF BIRTH:  [birth year]   DATE OF ADMISSION:  08/17/2004  DATE OF DISCHARGE:  08/20/2004                                 DISCHARGE SUMMARY   ADMISSION DIAGNOSES:  1.  End-stage osteoarthritis of the right knee.  2.  Gastrointestinal reflux disease.  3.  Hypertension.  4.  Hypercholesterolemia.  5.  Hypothyroidism.   DISCHARGE DIAGNOSES:  1.  End-stage osteoarthritis of the right knee, status post right total knee      arthroplasty.  2.  Gastrointestinal reflux disease.  3.  Hypertension.  4.  Hypercholesterolemia.  5.  Hypothyroidism.  6.  Postoperative hemorrhagic anemia, stable at the time of discharge.   PROCEDURES:  The patient was taken to the operating room on August 17, 2004, and underwent a right total knee arthroplasty.  The surgeon was V.  Charlesetta Shanks, M.D.  The assistant was Clarene Reamer, P.A.-C.  The surgery was  done under general anesthesia with a femoral block.  Two Hemovac drains were  placed at the time of surgery.  There were no complications.   CONSULTS:  1.  Physical therapy.  2.  Occupational therapy.  3.  Social work.  4.  Case management.  5.  Physical medicine and rehabilitation with Dr. Thomasena Edis.   BRIEF HISTORY:  The patient is a 40 year old female with a history of right  knee pain.  She has previously had multiple knee scopes which have not  relieved her symptoms.  She came in to see Dr. Renae Fickle for a second opinion and  further treatment.  Dr. Renae Fickle felt after radiographs were taken and the  patient's symptoms, especially with failure of conservative treatment, that  she would most likely benefit from right total knee replacement.  The  patient agrees.  The risks and benefits of the surgery were discussed with  the patient.   The patient wishes to proceed.   LABORATORY DATA:  The CBC on admission showed a hemoglobin of 14.0,  hematocrit 39.9, white blood cell count 9.1 and red blood cell count 4.2.  Serial H&Hs were followed throughout the hospital stay.  The hemoglobin and  hematocrit declined to 10.6 and 29.5 and were stable at the time of  discharge.  Coagulation studies on admission were all within normal limits.  PT and INR at the time of discharge were 25.1 and 3.2, respectively.  Routine chemistry on admission was also within normal limits.  Serial  chemistries were also taken throughout the hospital stay.  CO2 was high at  33 on August 18, 2004, with return to the normal range the following day.  The sodium fell to 133 on August 19, 2004, but was also stable at the time  of discharge.  BUN also fell to 5 and was stable at the time of discharge.  The urinalysis on admission showed many epithelials, otherwise normal.  The  patient's  blood type was A positive with antibody screen negative.  Preoperative chest x-ray revealed no acute abnormality.   HOSPITAL COURSE:  The patient was admitted to [facility] and taken  to the operating room.  She underwent the above-stated procedure without  complication.  The patient tolerated the procedure well and was allowed to  returned to the recovery room and the orthopedic floor for continued  postoperative care.  On postoperative day #1, the patient was complaining of  some pain as expected.  Temperature maximum was 101.2 degrees.  Hemoglobin  and hematocrit 11.2 and 31.3, respectively.  She was neurovascularly intact  to the right lower extremity.  The patient worked with physical therapy and  occupational therapy.  On postoperative day #2, the patient had a  temperature maximum of 101.2 degrees, hemoglobin of 10.6 and hematocrit of  29.5.  The dressing was changed on this day.  The wound was clean, dry and  intact.  She was neurovascularly intact to the  right lower extremity.  On  postoperative day #3, the patient was resting comfortably with temperature  maximum 101.1 degrees, hemoglobin 10.6 and hematocrit 27.2.  She was  neurovascularly to the right lower extremity.  The incision was clean, dry  and intact.  The Foley and PCA were discontinued at this time.  A  rehabilitation consult was carried out and the patient was appropriate for  inpatient rehabilitation.  The patient was subsequently transferred to  rehabilitation on August 20, 2004.   DISPOSITION:  The patient was discharged to [facility] on August 20, 2004.   DISCHARGE MEDICATIONS:  1.  Benadryl 25 mg p.o. q.6h. p.r.n. itching.  2.  Colace 100 mg one p.o. b.i.d.  3.  Trinsicon one caplet p.o. t.i.d.  4.  Laxative of choice one unit p.o. p.r.n.  5.  __________ of choice one unit p.r. p.r.n.  6.  Coumadin per pharmacy protocol.  7.  Phenergan 25 mg p.o. q.6h. p.r.n.  8.  Percocet 5/325 mg one to two p.o. q.4-6h. p.r.n.  9.  Tylenol 325-650 mg p.o. q.4h. p.r.n. temperature greater than 101.5      degrees.  10. Robaxin 500 mg p.o. q.6-8h. p.r.n.  11. Restoril 15-30 mg p.o. q.h.s. p.r.n. sleep.  12. Reglan 10 mg p.o. q.8h. p.r.n.  13. Trazodone 150 mg p.o. q.h.s.  14. Klonopin 1 mg p.o. b.i.d.  15. Protonix 40 mg p.o. daily.  16. Premarin 0.9 mg p.o. daily.  17. HCTZ 25 mg p.o. daily.  18. Depakote 500 mg p.o. b.i.d.  19. Synthroid 100 mcg one p.o. daily.  20. Pravachol 40 mg one p.o. at 1800 hours.  21. Lopid 600 mg one p.o. b.i.d.  22. Wellbutrin 300 mg p.o. q.h.s.  23. Cepacol lozenges p.o. p.r.n.   DIET:  As tolerated.   ACTIVITY:  The patient is weightbearing as tolerated to the right lower  extremity with total knee precautions.   WOUND CARE:  The patient had daily dressing changes performed until no  drainage.   FOLLOWUP:  The patient is to follow up with Dr. Renae Fickle two weeks from the day of surgery or upon discharge from  rehabilitation, which is later.  She is to  call the office for an appointment at [phone].   CONDITION ON DISCHARGE:  Stable and improved.       SW/MEDQ  D:  10/19/2004  T:  10/19/2004  Job:  [id number]

## 2011-03-18 NOTE — Discharge Summary (Signed)
NAME:  [name], [name]                       ACCOUNT NO.:  [id number]   MEDICAL RECORD NO.:  [id number]                   PATIENT TYPE:  INP   LOCATION:  9326                                 FACILITY[facility]  [facility]   PHYSICIAN:  Miguel Aschoff, M.D.                    DATE OF BIRTH:  11/14/1970   DATE OF ADMISSION:  08/14/2003  DATE OF DISCHARGE:  08/17/2003                                 DISCHARGE SUMMARY   ADMISSION DIAGNOSES:  Chronic pelvic pain.   FINAL DIAGNOSES:  1. Chronic pelvic pain.  2. Pelvic adhesions.   OPERATION/PROCEDURE:  1. Total abdominal hysterectomy.  2. Bilateral salpingo-oophorectomy.  3. Lysis of adhesions.   BRIEF HISTORY:  The patient is a 40 year old white female with a long  history of persistent chronic pelvic pain and worsening dysmenorrhea.  The  patient had been treated on multiple regimens in the past including  nonsteroidal anti-inflammatory agents and Depo-Lupron therapy.  The patient  had improvement of her symptoms with the Depo-Lupron.  Having completed this  course her symptoms have now returned with worsening dysmenorrhea and pelvic  pain.  Attempts have been made to try to control the pain with noncyclic  oral contraceptives.  This has resulted in significant breakthrough bleeding  and worsening of the pelvic pain.  The patient now expresses desire to  undergo definitive treatment to eliminate her symptoms.  She is therefore  being admitted to the hospital for total abdominal hysterectomy, bilateral  salpingo-oophorectomy.   HOSPITAL COURSE:  Preoperative studies were obtained.  This included  admission hemoglobin of 13.3, hematocrit 38.1, white count 11,600.  Chemistry profile was within normal limits except for a slightly low  potassium at 3.3.  The patient was taken to the operating room on October 14  and under general anesthesia laparotomy was carried out.  At the time of  laparotomy the patient was noted to have multiple omental adhesions  in the  anterior abdominal wall and adhesions of her ovary, especially on the left  side to the lateral pelvic side wall.  No discreet implants of endometriosis  were noted at the time of surgery.  The procedure consisted of a total  abdominal hysterectomy and bilateral salpingo-oophorectomy.  The patient  tolerated the procedure well.  The patient's postoperative course was  essentially uncomplicated, tolerating increasing ambulation and diet well.  Was voiding spontaneously and taking regular diet.  Was afebrile in  satisfactory condition to be discharged home on August 17, 2003.  Discharge  hemoglobin was 10.0, white count 8700.  The pathology report on the  hysterectomy specimen showed benign proliferative endometrium, bilateral  ovarian follicle cysts.  Myometrium and serosa did not show any pathologic  diagnosis.   The patient will see me back in four weeks for follow-up examination.   DISCHARGE MEDICATIONS:  1. Tylox one q.3h. as needed for pain.  2. Toradol 10 mg q.4h. as  needed for pain.  3. She is to use laxative of her choice.  4. Resume all other preoperative medications.   DISCHARGE INSTRUCTIONS:  The patient is to call if there are any problems  such as fever, pain, or heavy bleeding.   DISPOSITION:  She was sent home in satisfactory condition on a regular diet.                                               Miguel Aschoff, M.D.    AR/MEDQ  D:  08/18/2003  T:  08/18/2003  Job:  [id number]

## 2011-03-18 NOTE — Op Note (Signed)
NAMEALLYCE, BOCHICCHIO             ACCOUNT NO.:  [id number]   MEDICAL RECORD NO.:  [id number]          PATIENT TYPE:  OIB   LOCATION:  2550                         FACILITY:  [facility]   PHYSICIAN:  Deidre Ala, M.D.    DATE OF BIRTH:  12-16-1970   DATE OF PROCEDURE:  08/17/2004  DATE OF DISCHARGE:                                 OPERATIVE REPORT   PREOPERATIVE DIAGNOSIS:  End-stage degenerative joint disease, right knee,  patellofemoral joint versus tricompartment.   POSTOPERATIVE DIAGNOSIS:  Tricompartment degenerative joint disease, right  knee.   OPERATION:  Right total knee arthroplasty using cemented DePuy components,  LCS type, with rotating platform.   SURGEON:  1.  Charlesetta Shanks, M.D.   ASSISTANT:  Clarene Reamer, P.A.-C.   ANESTHESIA:  General with LMA.   CULTURES:  None.   DRAINS:  None.   ESTIMATED BLOOD LOSS:  Minimal.   TOURNIQUET TIME:  1 hour 32 minutes.   PATHOLOGIC FINDINGS AND HISTORY:  Latanya is a young patient, 40 years old,  who has had four knee scopes and a saphenous branch neuroma excision for  constant pain.  Dr. Julieanne Cotton in May operated on her and finally felt  that she needed a total knee arthroplasty.  He did not do those.  He sent  her to [facility] for it.  They were unsure of whether or not to do a  patellofemoral procedure i.e., a Fulkerson slide realignment, versus a total  knee.  In the meantime the patient had difficulty making the trip back and  forth to [facility] financially and elected to see me.  At interview I felt she  had significant patellofemoral disease, but I was concerned that she had  defects on the femoral condyles.  We therefore elected to do either a  patellofemoral replacement or a total knee.  At surgery she did indeed have  tricompartment DJD, massive involvement of the trochlea to bone, and  patellofemoral joint not within the scope of a patellofemoral replacement.  Therefore, she underwent right total knee  arthroplasty using a standard  femur, a #3 tibia, a 10 mm rotating platform insert, and a 35 mm all-poly  button.  Good tilt and track was obtained as well as full extension and  flexion with ligament balance.  Zinacef was used in the cement.   PROCEDURE:  With adequate anesthesia obtained using LMA technique, 1 g Ancef  given IV prophylaxis and another one at tourniquet let-down, the patient was  placed in the supine position, and the right lower extremity was prepped  from the toes to the tourniquet in the standard fashion.  After standard  prepping and draping, Esmarch exsanguination was used.  The tourniquet was  let up to 350 mmHg.  A median parapatellar skin incision was then made well  medial to her previous saphenous nerve transposition procedure.  Incision  was deepened sharply with a knife and hemostasis obtained using the Bovie  electrocoagulator.  A flap was created over the patella and then the median  parapatellar retinacular incision was made.  The patella was then everted.  The fat  pad was excised.  The tibia extramedullary guide was then put in  place and standard cut made.  Later we increased that cut to 2.5 mm more for  appropriate extension and flexion gap.  I then sized the femur to a  standard, made the anterior-posterior cuts, fit a 12.5, but it was a bit  tight.  I then used the 4 degree valgus distal cutting jig, made that cut,  still felt it was a bit tight so downsized to a 10, which was still a bit  tight on flexion-extension, cut 2 more in the tibia, then it balanced at 10  degrees flexion-extension.  Anterior-posterior chamfer cutting jig was put  in place and cut.  Actually, the tibia was readjusted after we sized the  entire prosthesis, but the tibial additional cut, but eventually it balanced  in 12 flexion-extension.  We had prior to this cut the tibia with a #3  guide, placed the central peg drill as well as the keel, broached the keel,  and then  articulated the knee through a range of motion, feeling it a bit  tight as above, cut the tibia 2.5 more with the guide, and then fit 10 mm in  on full flexion, full extension, with satisfactory ligament balance with a  slight lateral retinacular release carried out around the rim, therefore  balancing with full extension and full flexion.  I then cut the patella with  the guide from a 23 down to a 14-1/2.  A three-peg guide put in place.  The  trial patella was put in place, a 35 mm, and it tracked well.  All trial  components were then removed while jet lavage was carried out on the knee,  cement was mixed, and parts were checked for sizing as they came on the  field.  We then cemented on the tibial component, impacted it, and removed  excess cement.  We then put in the rotating platform and then cemented on  the femoral component, impacted it, and removed excess cement.  We then  cemented on the patellar component, impacted it, removed the excess cement,  and held it with a clamp until the cement had cured.  The knee was held in  full extension for final squeezing out of the cement with the excess cement  removed.  Jet lavage was then again carried out.  When the cement had cured,  the tourniquet was let down and bleeding points were cauterized.  Hemovac  drains were placed in the medial and lateral portals and brought out the  superior lateral portal.  The wound was then closed in layers with #1 Vicryl  on the retinaculum, 0 and 2-0 Vicryl in the subcu, and running 3-0 Monocryl  with Steri-Strips.  A bulky sterile compressive dressing was applied with  Ace.  The patient then having tolerated the procedure well was awakened,  taken to the recovery room in satisfactory condition, to be admitted for  routine postoperative care and CPM.  Range of motion on the table was 0-105  degrees.  There was good patellar tracking.       VEP/MEDQ  D:  08/17/2004  T:  08/17/2004  Job:  [id number]   cc:    Julieanne Cotton [address]  [place]  [place] [address]  Fax: [phone]

## 2011-03-18 NOTE — Discharge Summary (Signed)
NAMEGRADY, LUCCI             ACCOUNT NO.:  [id number]   MEDICAL RECORD NO.:  [id number]          PATIENT TYPE:  WOC   LOCATION:  WOC                          FACILITY:  [facility]   PHYSICIAN:  Mariam Dollar, P.A.  DATE OF BIRTH:  1971/09/29   DATE OF ADMISSION:  08/20/2004  DATE OF DISCHARGE:  09/01/2004                                 DISCHARGE SUMMARY   DISCHARGE DIAGNOSES:  1.  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  2.  Subcutaneous Lovenox for deep vein thrombosis prophylaxis.  3.  Hypertension.  4.  Hyperlipidemia.  5.  Gastroesophageal reflux disease.  6.  Anxiety with depression.  7.  Bipolar disorder.  8.  Hypothyroidism.  9.  Anemia.   HISTORY OF PRESENT ILLNESS:  A 40 year old white female admitted October 18  with bone on bone changes of the knee and no relief with conservative care.  Underwent a right total knee replacement on August 17, 2004, by Dr. Renae Fickle.  Was placed on Coumadin for deep vein thrombosis prophylaxis.  Weightbearing  as tolerating.  Foley catheter tube discontinued October 21.  Monitoring of  INR of 5.5 and held Coumadin.  No chest pain.  No bleeding episodes.  Admitted for comprehensive rehabilitation program.   PAST MEDICAL HISTORY:  See discharge diagnoses.   SOCIAL HISTORY:  Lives alone in [place], on disability secondary to knee.  One level home.  No steps to entry.  Mother to assist on discharge.   ALLERGIES:  SULFA, CODEINE, MORPHINE.   MEDICATIONS:  1.  Trazodone.  2.  Klonopin.  3.  Wellbutrin.  4.  Protonix.  5.  Premarin.  6.  Synthroid.  7.  Hydrochlorothiazide.  8.  Lopid.  9.  Depakote.  10. Zocor.   HOSPITAL COURSE:  Patient with progressive gains while in rehabilitation  services with therapies initiated on a b.i.d. basis.  The following issues  were followed during the patient's rehab course.  Pertaining to Ms.  Grau's right total knee replacement,  surgical site healing nicely.  Staples had been removed.  No signs of infection.  She was limited with her  CPM machine at 0-40/50 degrees.  One was ordered for home with plans to  navigate to 90 degrees as tolerated.  Pain management ongoing.  She was  placed on sustained release OxyContin of 20 mg every 12 hours and slowly  tapered.  She had been on Coumadin for deep vein thrombosis prophylaxes  noted prior to admission to rehabilitation.  INR 5.5.  Coumadin had been  held.  Noted again elevated INR 5.2 on October 22, 7.9 on October 25, 8.6 on  October 26.  Pharmacy was contacted to monitor all of the patient's  medications and did not feel to be medication induced.  She did receive  vitamin K to reverse her Coumadin which improved to 1.1.  Due to bleeding  risks, her Coumadin was discontinued.  Placed on subcutaneous Lovenox until  discharged from rehabilitation center.  Blood pressures remained controlled  on hydrochlorothiazide.  She continued on combination of  Wellbutrin,  Depakote, Desyrel and Klonopin for history of anxiety/depression with  bipolar disorder followed by Dr. Leonides Cave of neuropsychology.  Maintained on  hormone supplement for her hypothyroidism.  She had no bowel or bladder  disturbances.  Overall for her functional status, she was modified  independent in her room.  Home Health therapies had been arranged.  Family  teaching completed.  She was discharged to home.   LABORATORY DATA:  Latest labs showed a hemoglobin of 10.1, hematocrit 27.8,  sodium 134, potassium 3.4, BUN 14, creatinine 1.1.   DISCHARGE MEDICATIONS:  1.  OxyContin sustained release 10 mg q.12h. x2 weeks.  2.  Depakote 500 mg b.i.d.  3.  Synthroid 100 mcg daily.  4.  Pravachol 40 mg daily.  5.  Lopid 600 mg b.i.d.  6.  Wellbutrin 150 mg daily and 300 mg q.h.s.  7.  Klonopin 1 mg b.i.d.  8.  Premarin 0.9 mg daily.  9.  Hydrochlorothiazide 25 mg daily.  10. Protonix 40 mg daily.  11. Desyrel 150 mg  q.h.s.  12. Oxycodone for breakthrough pain.   DISCHARGE INSTRUCTIONS:  1.  Activity as tolerated.  2.  Home CPM machine 0-60 degrees increased by 10 degrees daily to a maximum      of 90 degrees.  3.  Home Health physical therapy, occupational therapy.   FOLLOWUP:  She should follow up with Dr. Renae Fickle, Orthopedic Services.       DA/MEDQ  D:  08/31/2004  T:  08/31/2004  Job:  [id number]   cc:   Ellwood Dense, M.D.  [address]  [place] [address]  Fax: [phone]   V. Charlesetta Shanks, M.D.  [address]  [place], [place] [address]  Fax: [phone]

## 2011-03-18 NOTE — Discharge Summary (Signed)
[name], [name]             ACCOUNT NO.:  [id number]   MEDICAL RECORD NO.:  [id number]          PATIENT TYPE:  WOC   LOCATION:  WOC                          FACILITY:  [facility]   PHYSICIAN:  UNKNOWN                DATE OF BIRTH:  [birth year]   DATE OF ADMISSION:  09/16/2004  DATE OF DISCHARGE:  09/18/2004                                 DISCHARGE SUMMARY   ADMISSION DIAGNOSES:  Acute mental status changes, history of hypertension,  dyslipidemia, GERD, anxiety with depression, bipolar disorder,  hypothyroidism, anemia status post right total knee 5 weeks ago, renal  insufficiency.   DISCHARGE DIAGNOSES:  1.  Acute mental status changes secondary to dehydration and poor oral      intake.  2.  Hypokalemia, resolved with supplementation.  3.  History of hypertension.  4.  History of dyslipidemia.  5.  Gastroesophageal reflux disease.  6.  Anxiety with depression.  7.  Bipolar disorder.  8.  Hypothyroidism.  9.  Anemia.  10. Renal insufficiency, resolved with hydration.   CONSULTATIONS:  Psychiatry.   PROCEDURE:  Diagnostic CT of the head on the 16th showed no acute  intracranial abnormalities.   HOSPITAL COURSE:  [name] is a 40 year old Caucasian female with history  of hypertension, dyslipidemia, GERD, anxiety and depression, bipolar  disorder, hypothyroidism and anemia, status post total knee about 5 weeks  ago.  The patient was brought in by the family from [facility], secondary  to confusion, poor oral intake.  The patient has had decreased appetite and  p.o. intake, increased weakness and anhedonia since her surgery.  She had no  history of fever, chills, shortness of breath, chest pain, palpitations,  cough, sputum production, abdominal pain, vomiting or diarrhea.  Also, no  dysuria, hematuria or skin rashes, no difficulty with walking or speech.   On arrival to emergency, the patient was hemodynamically stable.  Her mucosa  was very dry, she had cracked lips.   Lab work showed a creatinine on October  24th of 1.1 and on her admission it was 1.9, corresponding with increase of  BUN of 14 to 41 on admission.  Patient had UA which was noted for pyuria and  moderate leukocyte esterase, negative nitrites, many bacteria. CBC was  negative for leukocytosis.  Also of note, the patient had a potassium level of 2.3 on admission.   The patient was admitted to telemetry unit.  Supplemental potassium was  provided. Aggressive hydration was initiated, and by the afternoon, the  patient was much more alert and oriented to person, place and time. Her  potassium had come up nicely to 4.6 after multiple infusions of potassium.  The patient did have a Foley which was discontinued and patient was able to  urinate on her own.  She was able to move around with little difficulty.  PT  was consulted. They did walk with the patient and she did well with minimal  assist.  The patient was offered home health care and physical therapy,  however, she did not accept this.  During her time here in-hospital, Dr. Jeanie Sewer was kind enough to come by  and see the patient as there was a concern of increased depression, however,  based on his assessment, that her medications were adequate. There was no  change in the Depakote or Wellbutrin.  He did recommend outpatient  treatment.  The patient is to obtain an appointment in [facility], which  the case manager was able to set-up for her.  The patient left the hospital  in stable condition.   DISCHARGE MEDICATIONS:  1.  Depakote 500 mg 1 p.o. t.i.d.  2.  Wellbutrin 150 mg 1 p.o. b.i.d.  3.  Levothroid 175 mcg 1 p.o. daily.  4.  Gemfibrozil 600 mg 1 p.o. b.i.d.  5.  Protonix 40 mg 1 p.o. daily.  6.  Zocor 25 mg 1 p.o. daily.  7.  Prazepam 1 mg 1 p.o. b.i.d.  The patient was taking hydrochlorothiazide, I instructed her to hold on the  hydrochlorothiazide until she was able to see Dr. Merri Ray in [facility].   She was given  some Phenergan and Cipro 500 mg 1 p.o. b.i.d. for 5 additional  days for possible UTI.   DISCHARGE INSTRUCTIONS:  The patient was given a prescription for home PT.  She was strongly encouraged to contact them and initiate physical therapy.  Also, continue with a low sodium cardiac diet.  The patient had an  appointment with Elliot Gurney on September 20, 2004. She is to call for an  appointment at [facility] with  Dr. Merri Ray in 1-2 weeks and Dr. Renae Fickle, her  ophthalmologist as needed.       /MEDQ  D:  09/18/2004  T:  09/18/2004  Job:  [id number]   cc:   Merri Ray, Dr.   Renae Fickle, Dr.

## 2011-03-18 NOTE — H&P (Signed)
[name], [name]             ACCOUNT NO.:  [id number]   MEDICAL RECORD NO.:  [id number]          PATIENT TYPE:  IPS   LOCATION:  0305                          FACILITY:  [facility]   PHYSICIAN:  Geoffery Lyons, M.D.      DATE OF BIRTH:  [birth year]   DATE OF ADMISSION:  04/05/2005  DATE OF DISCHARGE:                         PSYCHIATRIC ADMISSION ASSESSMENT   TIME OF EXAM:  11:35 a.m.   IDENTIFYING INFORMATION:  This is a 40 year old white female who is  separated.  This is a voluntary admission.   HISTORY OF PRESENT ILLNESS:  This patient became very tearful, could not  stop crying and was somewhat agitated in her psychotherapist's office  yesterday.  She expressed some thoughts of wanting to overdose on  medications and was referred by her therapist for hospitalization.  The  patient herself endorses that her mood was quite bad yesterday, feeling very  down and depressed and endorses episodes of depression with some agitated  thinking occurring in unpredictable spells over the course of the last 2-4  weeks.  She said her mood is labile and tends to get worse, and she gets  more depressed whenever she is alone or she has some unstructured time on  her hands.  She currently lives alone and has daily visitation with her 20-  year-old son.  Her mother is quite supportive, and she does daily activities  such as shopping and errands with her mother.  She denies any substance  abuse.  She does endorse some labile mood.  Sleep is good.  Sleeping though  the night, no auditory or visual hallucinations.  Episodes of feeling  anxious with some agitated thought from time to time tends to occur,  generally in the mornings or when she is alone for any period of 4-5 hours.  She has had no suicide attempts.  She reports compliance with her  medications.  She was taken off her Risperdal which was 1 mg at night and  0.5 mg in the morning by Dr. Hortencia Pilar a couple of months ago.   PAST PSYCHIATRIC  HISTORY:  The patient is followed at [facility] by Dr. Hortencia Pilar.  This is her third admission to [facility] with her last one being in February 4-7, 2006.  She  was also here January 31 to December 02, 2004.  She has a history of bipolar  disorder and does describe some history of irresponsible spending and some  increased energy, but yet has always been able to sleep every night.  She  has had lithium in the distant past, none recently.  She states that she has  been doing well on her current dose of Wellbutrin and Depakote and denies  that she has missed any doses.  Also has been taking her Klonopin as  prescribed.   SOCIAL HISTORY:  This is a separated white female who is now separate 4  years.  She has an amicable relationship with her ex-husband who has custody  of their 62-year-old son.  The son comes to her house daily after school.  She  is active, doing many activities with him, and patient's mother is quite  supportive.  She sees her mother daily, no legal problems.  No financial  stressors.  The patient has Medicaid to help her pay for medications.  Patient does have a basic education with 2 years of college.   FAMILY HISTORY:  Remarkable for a biological father with alcoholism.   ALCOHOL AND DRUG HISTORY:  Patient denies any current or past history of  substance abuse of any type.  Does not use alcohol, nonsmoker.   PAST MEDICAL HISTORY:  Patient is followed by Dr. Emeline Darling who is her primary  care physician.   MEDICAL PROBLEMS:  1.  She is status post total knee replacement for bone spurs; knee      replacement was approximately 1 year ago.  She continues to have some      chronic knee pain.  2.  Also history of dyslipidemia for which she takes Zocor.  3.  Hypothyroidism for which she is on Synthroid.  4.  Also remarkable for appendectomy, cholecystectomy and total abdominal      hysterectomy.   MEDICATIONS:  1.  Zocor 20 mg  daily.  2.  Wellbutrin XL 150 mg daily.  3.  Protonix 40 mg daily.  4.  Synthroid 200 mcg daily.  5.  Premarin 0.9 mcg daily.  6.  Depakote ER 500 mg 1 every morning and 2 in the afternoon.  7.  Ambien 10 mg as needed at bedtime, which generally she does not use.  8.  Klonopin 1 mg 1 in the morning and 1 in the afternoon.  9.  Patient also received yesterday in her orthopedist's office a cortisone      injection into her right knee.  10. Darvocet-N 100 one tablet q.4h. p.r.n. for pain prescribed by her      orthopedist.   DRUG ALLERGIES:  CODEINE and SULFA.   PHYSICAL FINDINGS:  GENERAL:  This is a well-nourished, well-developed,  Caucasian female, overweight, who is in no acute distress, pleasant and  cooperative.  VITAL SIGNS:  Height 64 inches tall, 210 pounds, temperature 97.5, pulse 71,  respirations 16, blood pressure 109/74.  Grooming is appropriate.  HEAD:  Normocephalic and atraumatic.  EARS, EYES, NOSE AND THROAT:  Sclerae are nonicteric.  No rhinorrhea.  Oropharynx in satisfactory condition.  NECK:  Supple, no thyromegaly, no carotid bruits.  CARDIOVASCULAR:  S1 and S2 heard, no clicks, murmurs or gallops.  LUNGS:  Clear to auscultation.  ABDOMEN:  Obese, soft, nontender and nondistended.  GENITOURINARY:  Deferred.  EXTREMITIES:  Reveal a scar on the right knee.  NEUROLOGIC:  Cranial nerves II-XII are intact.  Extraocular movements are  normal, no nystagmus.  Facial and motor symmetry are present.  Sensory is  intact.  Cerebellar function is intact to rapid alternating movements.  Gait  is normal, normal arm swing.  Romberg's without finding.  Neuro is nonfocal.   DIAGNOSTIC STUDIES:  Reveal a WBC of 9.7, hemoglobin 13.1, hematocrit 37.7,  platelets normal at 275,000.  Sodium 141, potassium 4.1, chloride 105,  carbon dioxide 29, BUN 21, creatinine 1.1.  Glucose within normal limits at 94.  TSH is normal at 1.087.  Valproic acid level is decreased at 37.6.  Routine  urinalysis is currently pending.  We did not do a urine pregnancy  test because of her history of total abdominal hysterectomy.   MENTAL STATUS EXAM:  This is a fully alert patient with an appropriate  affect,  calm and appropriate manner today.  Hygiene and dress are  appropriate.  Speech is normal in pace, tone and amount.  Mood depressed.  Thought process positive for suicidal ideation by history, but denies any of  this today.  She was clearly tearful yesterday with some history of agitated  episodes and mood lability, no homicidal ideation, no paranoia  or guarding,  no signs of psychosis.  Insight good.  Impulse control and judgment within  normal limits.  Cognitively she is intact and oriented x 3.   ADMISSION DIAGNOSES:   AXIS I:  Rule out bipolar I disorder, depressed.   AXIS II:  Deferred.   AXIS III:  1.  Chronic knee pain.  2.  Hypothyroidism.  3.  Dyslipidemia.   AXIS IV:  Deferred.  Having supportive family is an asset to her, and good  relationship with her son is asset.   AXIS IV:  Current 25, past year 71-75.   PLAN:  Voluntarily admit the patient with ever 15 minute checks in place.  Because her Depakote is subtherapeutic and she does claim that she has been  compliant with her medications, we are going to increase her Depakote dose  to a total of 2000 mg daily, and add Risperdal 0.5 mg p.o. q.h.s. and 0.25  mg p.o. b.i.d.  We have discussed this with the patient, potential side  effects to watch for with the Risperdal, monitoring of the Depakote levels,  potential side effects, and purpose of the medications.  She is in agreement  with the plan.  Estimated length of stay is 4-7 days.       MAS/MEDQ  D:  04/06/2005  T:  04/06/2005  Job:  [id number]
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
## 2011-03-18 NOTE — Discharge Summary (Signed)
NAMEJESILYN, [name]             ACCOUNT NO.:  [id number]   MEDICAL RECORD NO.:  [id number]          PATIENT TYPE:  IPS   LOCATION:  0406                          FACILITY:  [facility]   PHYSICIAN:  Geoffery Lyons, M.D.      DATE OF BIRTH:  [birth year]   DATE OF ADMISSION:  11/30/2004  DATE OF DISCHARGE:  12/02/2004                                 DISCHARGE SUMMARY   CHIEF COMPLAINT AND PRESENT ILLNESS:  This was the first admission to [facility] for this 40 year old white female, admitted anxious  and agitated.  Brought to the hospital by the mother because not eating or  drinking.  Unable to care for herself.  Not attending to her ADLs including  bathing.  Severely hyponatremic.  Unable to give history secondary to the  way she was doing physically.   PAST PSYCHIATRIC HISTORY:  Previously followed by Dr. Jodi Marble and currently  Dr. Hortencia Pilar in [facility].  Diagnosed bipolar.  Previously overdosed with renal impairment.   ALCOHOL/DRUG HISTORY:  Denies the use or abuse of any substances.   MEDICAL HISTORY:  Hypokalemia, renal insufficiency secondary to dehydration,  gastroesophageal reflux, pyuria, nausea and status post right total knee  replacement, dyslipidemia.   MEDICATIONS:  Klonopin 0.25 mg in the morning, Depakote ER 500 mg in the  morning, 1000 mg at night, Wellbutrin XL 150 mg in the morning, Zocor 50 mg  daily, hydrochlorothiazide 25 mg daily, Protonix 40 mg per day, Klonopin 1  mg twice a day.   PHYSICAL EXAMINATION:  Performed and compatible with dehydration.   MENTAL STATUS EXAM:  Female with severe psychomotor retardation.  Affect  flat, disheveled.  Ambulating with difficulty.  Decreased volition, monotone  speech.  Mood depressed, anxious.  Affect depressed.  Thought processes  positive for blocking, disorganized, overwhelmed.  Cognition preserved.   ADMISSION DIAGNOSES:   AXIS I:  Bipolar disorder, depressed.   AXIS II:   No diagnosis.   AXIS III:  1.  Dehydration.  2.  Renal insufficiency.  3.  Hypokalemia.  4.  Pyuria.  5.  Status post total knee replacement.   AXIS IV:  Moderate.   AXIS V:  Global Assessment of Functioning upon admission 15-20; highest  Global Assessment of Functioning in the last year 70-75.   HOSPITAL COURSE:  She was admitted and started in individual and group  psychotherapy.  She evidenced the hypokalemia, renal insufficiency,  hypothyroidism.  TSH was 25.  She was severely compromised, so she was  transferred  to [facility] for medical treatment.   DISCHARGE DIAGNOSES:   AXIS I:  Bipolar disorder, depressed.   AXIS II:  No diagnosis.   AXIS III:  1.  Dehydration.  2.  Renal insufficiency.  3.  Hypokalemia.  4.  Hypothyroidism.  5.  Status post total knee replacement.   AXIS IV:  Moderate.   AXIS V:  Global Assessment of Functioning upon discharge 25-30.   FOLLOW UP:  Discharged to [facility] for medical  stabilization.      IL/MEDQ  D:  01/19/2005  T:  01/21/2005  Job:  [id number]

## 2011-03-18 NOTE — Discharge Summary (Signed)
[facility]  Patient:    [name], [name]                    MRN: [id number] Adm. Date:  [id number] Disc. Date: [id number] Attending:  Marcelle Overlie                           Discharge Summary  ADMISSION DIAGNOSIS:  Low abdominal pain, possible endometritis.  FINAL DIAGNOSIS:  Low abdominal pain, possible endometritis.  BRIEF HISTORY:  The patient is a 40 year old white female who has had recurrent episodes of right lower quadrant pain.  The patient reported that her pain started at approximately 5 p.m. on August 28 and caused her to double up.  It was associated with a low-grade temperature at 100.6.  On evaluation on admission, she was noted to have a markedly tender uterus with cervical motion tenderness.  Her right adnexa was tender.  No masses were noted.  The patients white count was elevated at 16,000.  Wet prep was negative.  The patient was started on intravenous antibiotic therapy.  An ultrasound was scheduled, and this did not reveal any significant abnormalities in the pelvis.  The patient was continued on her antibiotics; however, on the afternoon of August 30, she was feeling markedly improved, and it was felt at this point that she could be sent home on oral medications.  The patient was discharge.  DISCHARGE MEDICATIONS:  Doxycycline 100 mg twice a day for 7 days.  DISCHARGE INSTRUCTIONS:  She was instructed no heavy lifting, place nothing in the vagina, and call for any problems such as fever, pain, or heavy bleeding. The plan is for the patient to return to the office in four weeks for followup examination.  At that point, if she continues to have persistent right lower quadrant pain, the patient will undergo laparoscopy to see if an etiology can be established and corrected. DD:  07/05/00 TD:  07/06/00 Job: [id number]

## 2011-04-05 ENCOUNTER — Emergency Department (HOSPITAL_COMMUNITY)
Admission: EM | Admit: 2011-04-05 | Discharge: 2011-04-05 | Disposition: A | Discharge status: Home / Self Care | Payer: Medicaid Other | Attending: Emergency Medicine | Admitting: Emergency Medicine

## 2011-04-05 ENCOUNTER — Emergency Department (HOSPITAL_COMMUNITY): Payer: Medicaid Other

## 2011-04-05 DIAGNOSIS — I10 Essential (primary) hypertension: Secondary | ICD-10-CM | POA: Insufficient documentation

## 2011-04-05 DIAGNOSIS — M25569 Pain in unspecified knee: Secondary | ICD-10-CM | POA: Insufficient documentation

## 2011-04-05 DIAGNOSIS — F319 Bipolar disorder, unspecified: Secondary | ICD-10-CM | POA: Insufficient documentation

## 2011-04-05 DIAGNOSIS — R0602 Shortness of breath: Secondary | ICD-10-CM | POA: Insufficient documentation

## 2011-04-05 DIAGNOSIS — Z7901 Long term (current) use of anticoagulants: Secondary | ICD-10-CM | POA: Insufficient documentation

## 2011-04-05 DIAGNOSIS — E7800 Pure hypercholesterolemia, unspecified: Secondary | ICD-10-CM | POA: Insufficient documentation

## 2011-04-05 DIAGNOSIS — R0789 Other chest pain: Secondary | ICD-10-CM | POA: Insufficient documentation

## 2011-04-05 DIAGNOSIS — E669 Obesity, unspecified: Secondary | ICD-10-CM | POA: Insufficient documentation

## 2011-04-05 DIAGNOSIS — F341 Dysthymic disorder: Secondary | ICD-10-CM | POA: Insufficient documentation

## 2011-04-05 DIAGNOSIS — Z79899 Other long term (current) drug therapy: Secondary | ICD-10-CM | POA: Insufficient documentation

## 2011-04-05 DIAGNOSIS — E039 Hypothyroidism, unspecified: Secondary | ICD-10-CM | POA: Insufficient documentation

## 2011-04-05 DIAGNOSIS — R630 Anorexia: Secondary | ICD-10-CM | POA: Insufficient documentation

## 2011-04-05 DIAGNOSIS — Z86718 Personal history of other venous thrombosis and embolism: Secondary | ICD-10-CM | POA: Insufficient documentation

## 2011-04-05 LAB — POCT I-STAT, CHEM 8
Creatinine, Ser: 1.2 mg/dL (ref 0.4–1.2)
Glucose, Bld: 75 mg/dL (ref 70–99)
Hemoglobin: 13.6 g/dL (ref 12.0–15.0)
TCO2: 30 mmol/L (ref 0–100)

## 2011-04-05 LAB — PROTIME-INR
INR: 3.21 — ABNORMAL HIGH (ref 0.00–1.49)
Prothrombin Time: 32.9 s — ABNORMAL HIGH (ref 11.6–15.2)

## 2011-04-05 MED ORDER — IOHEXOL 300 MG/ML  SOLN
100.0000 mL | Freq: Once | INTRAMUSCULAR | Status: AC | PRN
  Administered 2011-04-05: 100 mL via INTRAVENOUS

## 2011-04-06 IMAGING — CR DG ANKLE COMPLETE 3+V*R*
4 series · 4 of 4 positions shown · non-contrast
Comparison: None

CLINICAL DATA: Injured ankle.

RIGHT ANKLE - COMPLETE 3+ VIEW

[t ankle joint ap right]
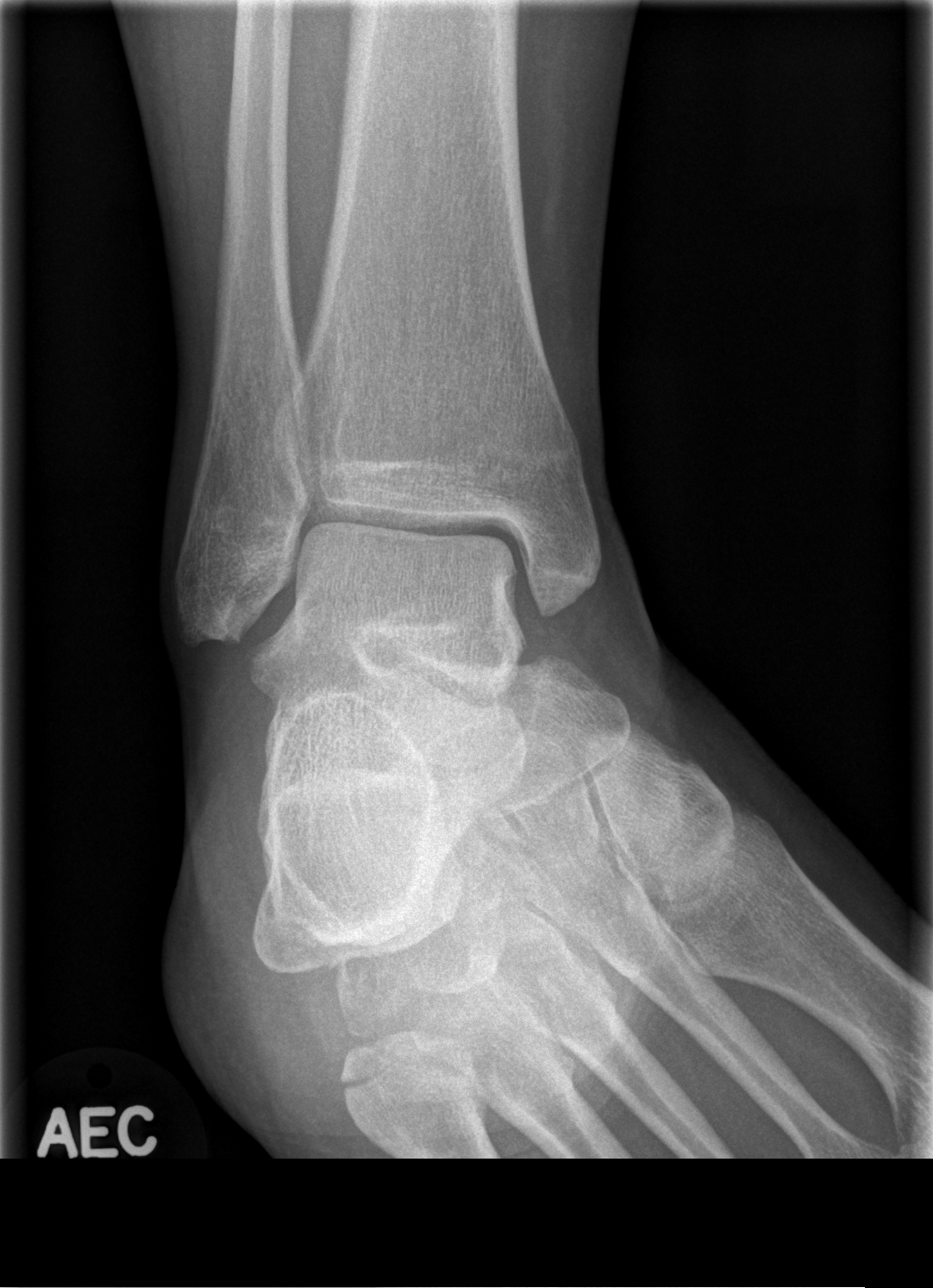

[t ankle joint oblique right (1 of 2)]
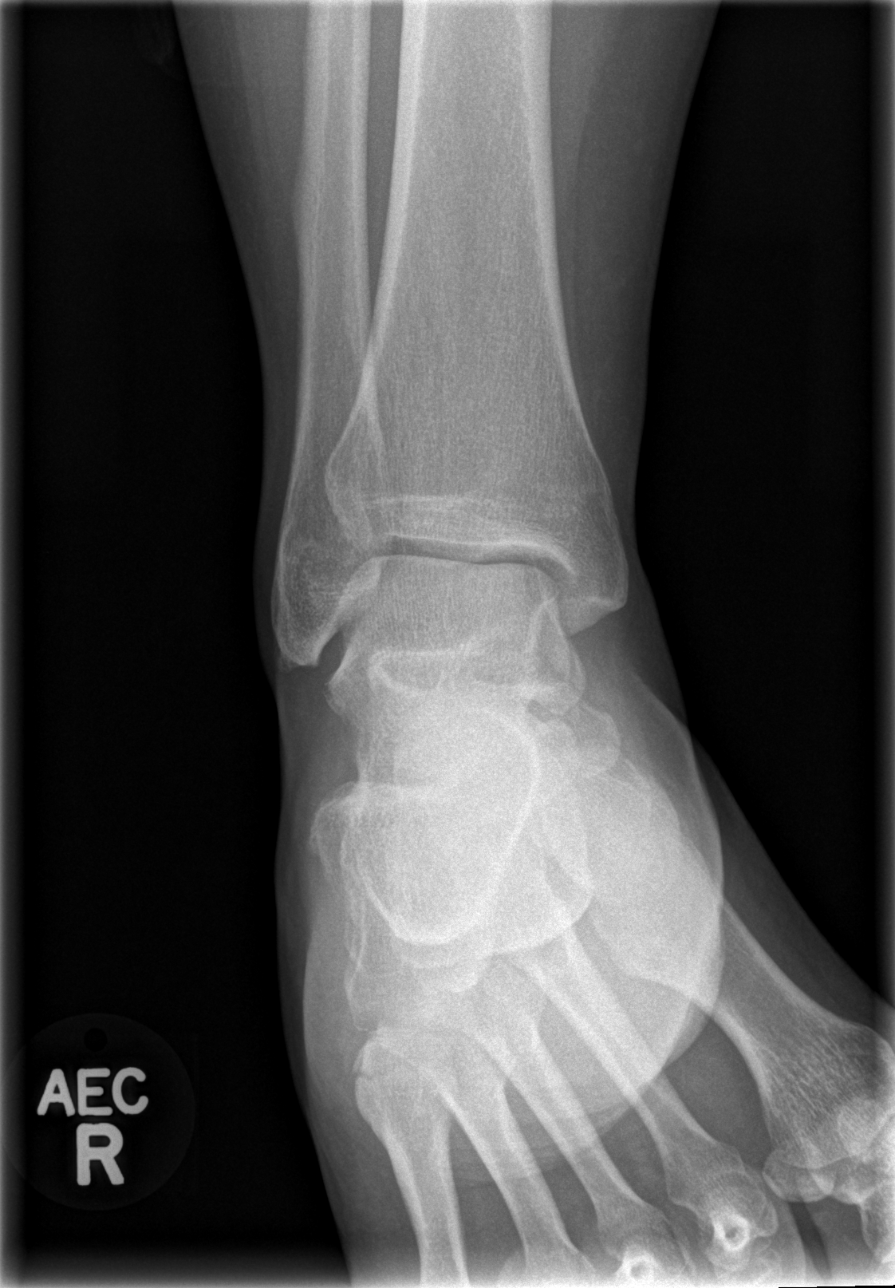

[t ankle joint oblique right (2 of 2)]
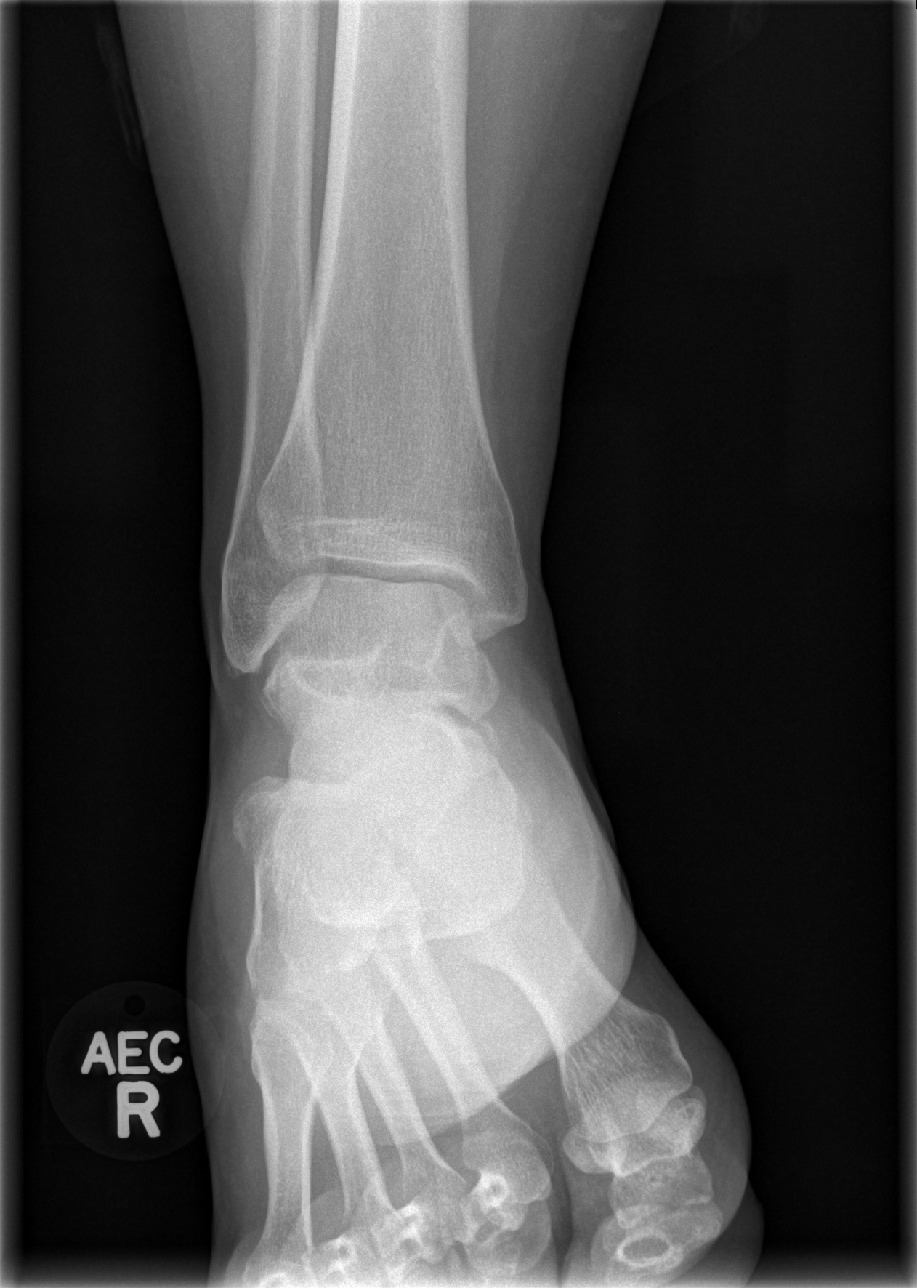

[t ankle joint lat right]
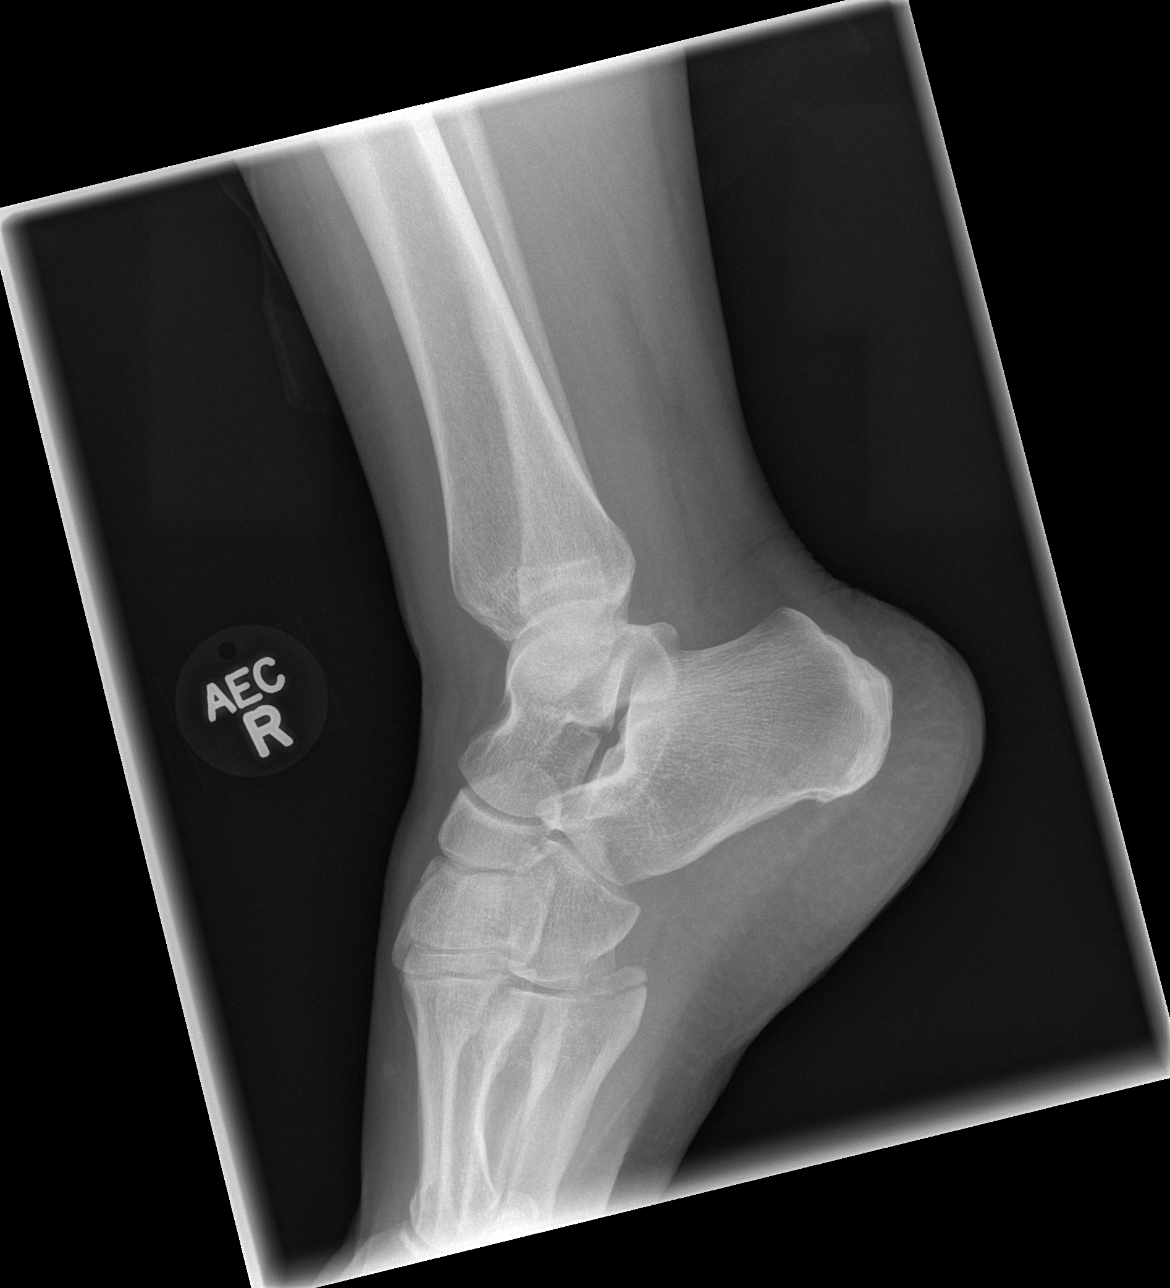

[4 of 4 positions shown; findings below may reference images not displayed]

FINDINGS: The ankle mortise is maintained.  No acute fracture or
osteochondral lesion.  There is a nondisplaced transverse fracture
through the base of the fifth metatarsal.
IMPRESSION: Fifth metatarsal base fracture.

## 2011-05-15 ENCOUNTER — Inpatient Hospital Stay (INDEPENDENT_AMBULATORY_CARE_PROVIDER_SITE_OTHER)
Admission: RE | Admit: 2011-05-15 | Discharge: 2011-05-15 | Disposition: A | Discharge status: Home / Self Care | Payer: Medicaid Other | Source: Ambulatory Visit | Attending: Family Medicine | Admitting: Family Medicine

## 2011-05-15 DIAGNOSIS — L989 Disorder of the skin and subcutaneous tissue, unspecified: Secondary | ICD-10-CM

## 2011-06-29 ENCOUNTER — Inpatient Hospital Stay (INDEPENDENT_AMBULATORY_CARE_PROVIDER_SITE_OTHER)
Admission: RE | Admit: 2011-06-29 | Discharge: 2011-06-29 | Disposition: A | Discharge status: Home / Self Care | Payer: Medicaid Other | Source: Ambulatory Visit | Attending: Family Medicine | Admitting: Family Medicine

## 2011-06-29 DIAGNOSIS — L259 Unspecified contact dermatitis, unspecified cause: Secondary | ICD-10-CM

## 2011-07-29 ENCOUNTER — Other Ambulatory Visit: Payer: Self-pay | Admitting: Family Medicine

## 2011-07-29 DIAGNOSIS — Z1231 Encounter for screening mammogram for malignant neoplasm of breast: Secondary | ICD-10-CM

## 2011-08-12 LAB — CBC
HCT: 43.1
Hemoglobin: 14.9
Platelets: 146 — ABNORMAL LOW
WBC: 7.2

## 2011-08-12 LAB — DIFFERENTIAL
Basophils Absolute: 0
Basophils Relative: 0
Eosinophils Absolute: 0
Eosinophils Relative: 0
Monocytes Absolute: 0.7

## 2011-08-12 LAB — COMPREHENSIVE METABOLIC PANEL
ALT: 13
AST: 20
Alkaline Phosphatase: 42
CO2: 23
Chloride: 108
Creatinine, Ser: 1.37 — ABNORMAL HIGH
GFR calc Af Amer: 52 — ABNORMAL LOW
GFR calc non Af Amer: 43 — ABNORMAL LOW
Potassium: 4.4
Sodium: 141
Total Bilirubin: 1.3 — ABNORMAL HIGH

## 2011-08-12 LAB — URINALYSIS, ROUTINE W REFLEX MICROSCOPIC
Glucose, UA: NEGATIVE
pH: 6

## 2011-08-12 LAB — URINE MICROSCOPIC-ADD ON

## 2011-08-15 LAB — POCT HEMOGLOBIN-HEMACUE: Operator id: 268271

## 2011-09-09 ENCOUNTER — Ambulatory Visit: Payer: Medicaid Other

## 2011-09-23 ENCOUNTER — Emergency Department (INDEPENDENT_AMBULATORY_CARE_PROVIDER_SITE_OTHER)
Admission: EM | Admit: 2011-09-23 | Discharge: 2011-09-23 | Disposition: A | Discharge status: Home / Self Care | Payer: Medicaid Other | Source: Home / Self Care | Attending: Emergency Medicine | Admitting: Emergency Medicine

## 2011-09-23 DIAGNOSIS — M5430 Sciatica, unspecified side: Secondary | ICD-10-CM

## 2011-09-23 HISTORY — DX: Acute embolism and thrombosis of unspecified deep veins of unspecified lower extremity: I82.409

## 2011-09-23 HISTORY — DX: Essential (primary) hypertension: I10

## 2011-09-23 LAB — CBC
MCH: 30.6 pg (ref 26.0–34.0)
MCHC: 34.4 g/dL (ref 30.0–36.0)
MCV: 89.1 fL (ref 78.0–100.0)
Platelets: 183 10*3/uL (ref 150–400)

## 2011-09-23 LAB — DIFFERENTIAL
Basophils Relative: 0 % (ref 0–1)
Eosinophils Absolute: 0.1 10*3/uL (ref 0.0–0.7)
Eosinophils Relative: 1 % (ref 0–5)
Neutrophils Relative %: 45 % (ref 43–77)

## 2011-09-23 LAB — POCT URINALYSIS DIP (DEVICE)
Glucose, UA: NEGATIVE mg/dL
Nitrite: NEGATIVE
Urobilinogen, UA: 1 mg/dL (ref 0.0–1.0)
pH: 7 (ref 5.0–8.0)

## 2011-09-23 MED ORDER — CYCLOBENZAPRINE HCL 5 MG PO TABS: 5.0000 mg | ORAL_TABLET | Freq: Three times a day (TID) | ORAL | Status: DC | PRN

## 2011-09-23 MED ORDER — HYDROCODONE-ACETAMINOPHEN 5-325 MG PO TABS
ORAL_TABLET | ORAL | Status: AC
Start: 2011-09-23 — End: 2011-10-03

## 2011-09-23 MED ORDER — PREDNISONE 10 MG PO TABS: ORAL_TABLET | ORAL | Status: AC

## 2011-09-23 NOTE — ED Notes (Signed)
C/o pain in her back w discomfort radiating into both legs  Since yest PM; states she lifted ham from oven, and thinks this is what caused the problem

## 2011-09-23 NOTE — ED Provider Notes (Signed)
History     CSN: [id number] Arrival date & time: 09/23/2011  7:15 PM   First MD Initiated Contact with Patient 09/23/11 1708      Chief Complaint  Patient presents with  . Back Pain    (Consider location/radiation/quality/duration/timing/severity/associated sxs/prior treatment) HPI Comments: [name] has had a two-week history of right flank pain that radiates into her back and down her entire left leg as far as the ankle. She has had burning of the leg but no numbness, tingling, or weakness. The pain is continuous but is made worse with standing, bending, or lifting and better when she sits down. She denies any injury to the back. She's never had anything like this before. She also describes a three-day history of a fever of up to 101 associated with some nasal congestion but no sore throat or cough. She denies any dysuria, frequency, urgency, or blood in the urine. She does have some abdominal pain localized mainly to the right flank area but this also does spread to the right upper and lower parts of the abdomen. She denies any nausea, vomiting, or diarrhea. She's had no gynecological complaints. 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 She also has high blood pressure. She has no history of degenerative disc disease or a disc protrusion.  Patient is a 40 y.o. female presenting with back pain.  Back Pain  Associated symptoms include a fever and abdominal pain. Pertinent negatives include no numbness, no dysuria, no pelvic pain and no weakness.    Past Medical History  Diagnosis Date  . Hypertension   . Deep venous thrombosis of leg     Past Surgical History  Procedure Date  . Abdominal hysterectomy   . Total knee revision     History reviewed. No pertinent family history.  History  Substance Use Topics  . Smoking status: Current Everyday Smoker  . Smokeless tobacco: Not on file  . Alcohol Use: No    OB History    Grav  Para Term Preterm Abortions TAB SAB Ect Mult Living                  Review of Systems  Constitutional: Positive for fever and chills. Negative for unexpected weight change.  Gastrointestinal: Positive for abdominal pain. Negative for nausea, vomiting, diarrhea, constipation and abdominal distention.  Genitourinary: Positive for flank pain. Negative for dysuria, urgency, frequency, hematuria, vaginal bleeding, vaginal discharge, difficulty urinating, vaginal pain, menstrual problem and pelvic pain.  Musculoskeletal: Positive for back pain. Negative for myalgias, joint swelling, arthralgias and gait problem.  Skin: Negative for rash.  Neurological: Negative for weakness and numbness.    Allergies  Codeine; Naproxen; and Sulfonamide derivatives  Home Medications   Current Outpatient Rx  Name Route Sig Dispense Refill  . CYCLOBENZAPRINE HCL 5 MG PO TABS Oral Take 1 tablet (5 mg total) by mouth 3 (three) times daily as needed for muscle spasms. 30 tablet 0  . HYDROCODONE-ACETAMINOPHEN 5-325 MG PO TABS  1 to 2 tabs every 4 to 6 hours as needed for pain. 20 tablet 0  . PREDNISONE 10 MG PO TABS  Take 4 tabs daily for 4 days, 3 tabs daily for 4 days, 2 tabs daily for 4 days, then 1 tab daily for 4 days.  Take all tabs at one time with food and preferably in the morning except for the first dose. 40 tablet 0    BP 134/73  Pulse 66  Temp(Src) 98.2 F (36.8 C) (Oral)  Resp 12  SpO2 98%  Physical Exam  Nursing note and vitals reviewed. Constitutional: She is oriented to person, place, and time. She appears well-developed and well-nourished.  Cardiovascular: Normal rate, regular rhythm, normal heart sounds and intact distal pulses.  Exam reveals no gallop and no friction rub.   No murmur heard. Pulmonary/Chest: Effort normal and breath sounds normal. No respiratory distress. She has no wheezes. She has no rales.  Abdominal: Soft. Bowel sounds are normal. She exhibits no distension, no  abdominal bruit, no pulsatile midline mass and no mass. There is tenderness (she has generalized abdominal tenderness without guarding or rebound. No hepatosplenomegaly or mass. Bowel sounds are normal.). There is no rebound and no guarding.  Musculoskeletal: She exhibits no edema and no tenderness.       Lumbar back: She exhibits decreased range of motion, tenderness, bony tenderness and pain. She exhibits no swelling, no edema, no deformity, no spasm and normal pulse.       Her back has limited range of motion with pain. She has some CVA tenderness on the right none on the left. She has tenderness to palpation over the lumbar paravertebral muscles but not over the spine itself. There is no tenderness to palpation of the sacroiliac area. Straight leg raising was positive on the right and she also had reverse straight leg raising test on the left as well. There was no calf tenderness, Homans sign was negative, and there was no swelling of either leg.  Neurological: She is alert and oriented to person, place, and time. She has normal reflexes. She displays no atrophy. No sensory deficit. She exhibits normal muscle tone. Coordination and gait normal.  Skin: Skin is warm and dry. No rash noted. She is not diaphoretic.    ED Course  Procedures (including critical care time)  Results for orders placed during the hospital encounter of 09/23/11  CBC      Component Value Range   WBC 8.4  4.0 - 10.5 (K/uL)   RBC 4.67  3.87 - 5.11 (MIL/uL)   Hemoglobin 14.3  12.0 - 15.0 (g/dL)   HCT 16.1  09.6 - 04.5 (%)   MCV 89.1  78.0 - 100.0 (fL)   MCH 30.6  26.0 - 34.0 (pg)   MCHC 34.4  30.0 - 36.0 (g/dL)   RDW 40.9  81.1 - 91.4 (%)   Platelets 183  150 - 400 (K/uL)  DIFFERENTIAL      Component Value Range   Neutrophils Relative 45  43 - 77 (%)   Neutro Abs 3.8  1.7 - 7.7 (K/uL)   Lymphocytes Relative 48 (*) 12 - 46 (%)   Lymphs Abs 4.1 (*) 0.7 - 4.0 (K/uL)   Monocytes Relative 5  3 - 12 (%)   Monocytes  Absolute 0.4  0.1 - 1.0 (K/uL)   Eosinophils Relative 1  0 - 5 (%)   Eosinophils Absolute 0.1  0.0 - 0.7 (K/uL)   Basophils Relative 0  0 - 1 (%)   Basophils Absolute 0.0  0.0 - 0.1 (K/uL)  POCT URINALYSIS DIP (DEVICE)      Component Value Range   Glucose, UA NEGATIVE  NEGATIVE (mg/dL)   Bilirubin Urine SMALL (*) NEGATIVE    Ketones, ur NEGATIVE  NEGATIVE (mg/dL)   Specific Gravity, Urine 1.025  1.005 - 1.030    Hgb urine dipstick TRACE (*) NEGATIVE    pH 7.0  5.0 - 8.0  Protein, ur 30 (*) NEGATIVE (mg/dL)   Urobilinogen, UA 1.0  0.0 - 1.0 (mg/dL)   Nitrite NEGATIVE  NEGATIVE    Leukocytes, UA TRACE (*) NEGATIVE       Labs Reviewed  DIFFERENTIAL - Abnormal; Notable for the following:    Lymphocytes Relative 48 (*)    Lymphs Abs 4.1 (*)    All other components within normal limits  POCT URINALYSIS DIP (DEVICE) - Abnormal; Notable for the following:    Bilirubin Urine SMALL (*)    Hgb urine dipstick TRACE (*)    Protein, ur 30 (*)    Leukocytes, UA TRACE (*) Biochemical Testing Only. Please order routine urinalysis from main lab if confirmatory testing is needed.   All other components within normal limits  CBC  POCT URINALYSIS DIPSTICK  URINE CULTURE   No results found.   1. Sciatica       MDM  Her picture is somewhat atypical for sciatica she she did give a history of a fever and she has some abdominal pain. Her temperature tonight was normal and or he cannot was normal with no left shift. Her urine showed only trace amounts of red blood cells and white blood cells. Her abdominal pain on examination was only minor. I did offer the possibility of going to the emergency room for further evaluation, but there was a very long wait tonight and she elected to go home with symptomatic treatment for sciatica, followup next week with her primary care doctor, and if things should worsen over the weekend to go directly to the emergency room.        Roque Lias,  MD 09/23/11 [phone]

## 2011-10-10 ENCOUNTER — Other Ambulatory Visit (HOSPITAL_COMMUNITY): Payer: Self-pay | Admitting: Family Medicine

## 2011-10-10 ENCOUNTER — Ambulatory Visit (HOSPITAL_COMMUNITY)
Admission: RE | Admit: 2011-10-10 | Discharge: 2011-10-10 | Disposition: A | Discharge status: Home / Self Care | Payer: Medicaid Other | Source: Ambulatory Visit | Attending: Family Medicine | Admitting: Family Medicine

## 2011-10-10 DIAGNOSIS — M545 Low back pain, unspecified: Secondary | ICD-10-CM | POA: Insufficient documentation

## 2011-11-02 ENCOUNTER — Ambulatory Visit: Payer: Medicaid Other | Attending: Family Medicine | Admitting: Physical Therapy

## 2011-11-22 ENCOUNTER — Emergency Department (HOSPITAL_COMMUNITY)
Admission: EM | Admit: 2011-11-22 | Discharge: 2011-11-22 | Disposition: A | Discharge status: Home / Self Care | Payer: Medicaid Other | Source: Home / Self Care | Attending: Family Medicine | Admitting: Family Medicine

## 2011-11-22 ENCOUNTER — Encounter (HOSPITAL_COMMUNITY): Payer: Self-pay | Admitting: Emergency Medicine

## 2011-11-22 DIAGNOSIS — J209 Acute bronchitis, unspecified: Secondary | ICD-10-CM

## 2011-11-22 DIAGNOSIS — J42 Unspecified chronic bronchitis: Secondary | ICD-10-CM

## 2011-11-22 MED ORDER — DEXTROMETHORPHAN POLISTIREX 30 MG/5ML PO LQCR: 60.0000 mg | ORAL | Status: AC | PRN

## 2011-11-22 MED ORDER — AMOXICILLIN-POT CLAVULANATE 875-125 MG PO TABS: 1.0000 | ORAL_TABLET | Freq: Two times a day (BID) | ORAL | Status: AC

## 2011-11-22 NOTE — ED Provider Notes (Signed)
History     CSN: [id number]  Arrival date & time 11/22/11  1001   First MD Initiated Contact with Patient 11/22/11 1021      Chief Complaint  Patient presents with  . Cough    (Consider location/radiation/quality/duration/timing/severity/associated sxs/prior treatment) Patient is a 41 y.o. female presenting with cough. The history is provided by the patient.  Cough This is a new problem. The current episode started more than 2 days ago. The problem has been gradually worsening. The cough is productive of purulent sputum. There has been no fever. Associated symptoms include rhinorrhea and sore throat. Pertinent negatives include no shortness of breath and no wheezing. She is a smoker. Her past medical history is significant for bronchitis.    Past Medical History  Diagnosis Date  . Hypertension   . Deep venous thrombosis of leg     Past Surgical History  Procedure Date  . Abdominal hysterectomy   . Total knee revision     No family history on file.  History  Substance Use Topics  . Smoking status: Current Everyday Smoker  . Smokeless tobacco: Not on file  . Alcohol Use: No    OB History    Grav Para Term Preterm Abortions TAB SAB Ect Mult Living                  Review of Systems  Constitutional: Negative.   HENT: Positive for congestion, sore throat, rhinorrhea and postnasal drip.   Respiratory: Positive for cough. Negative for shortness of breath and wheezing.   Gastrointestinal: Negative.   Skin: Negative.     Allergies  Codeine; Morphine and related; Naproxen; and Sulfonamide derivatives  Home Medications   Current Outpatient Rx  Name Route Sig Dispense Refill  . ACETAMINOPHEN 325 MG PO TABS Oral Take 650 mg by mouth every 6 (six) hours as needed.    Marland Kitchen PROPRANOLOL HCL 10 MG PO TABS Oral Take 10 mg by mouth 3 (three) times daily.    . AMOXICILLIN-POT CLAVULANATE 875-125 MG PO TABS Oral Take 1 tablet by mouth 2 (two) times daily. 20 tablet 0  .  DEXTROMETHORPHAN POLISTIREX ER 30 MG/5ML PO LQCR Oral Take 10 mLs (60 mg total) by mouth as needed for cough. 89 mL 0    BP 109/80  Pulse 91  Temp(Src) 98.2 F (36.8 C) (Oral)  Resp 16  SpO2 96%  Physical Exam  Nursing note and vitals reviewed. Constitutional: She appears well-developed and well-nourished.  HENT:  Head: Normocephalic.  Right Ear: External ear normal.  Left Ear: External ear normal.  Mouth/Throat: Oropharynx is clear and moist.  Eyes: Pupils are equal, round, and reactive to light.  Neck: Normal range of motion. Neck supple.  Cardiovascular: Normal rate, regular rhythm, normal heart sounds and intact distal pulses.   Pulmonary/Chest: Effort normal. Not tachypneic. She has rhonchi. She has no rales.  Abdominal: Soft. Bowel sounds are normal.  Lymphadenopathy:    She has no cervical adenopathy.  Skin: Skin is warm and dry.    ED Course  Procedures (including critical care time)  Labs Reviewed - No data to display No results found.   1. Bronchitis, chronic with acute exacerbation       MDM          Barkley Bruns, MD 11/22/11 [phone]

## 2011-11-22 NOTE — ED Notes (Signed)
Cough, hoarse voice and a lot of congestion.  Denies fever.  Onset Friday of symptoms.  Reports green phlegm

## 2011-11-29 ENCOUNTER — Ambulatory Visit: Payer: Medicaid Other

## 2011-12-02 ENCOUNTER — Other Ambulatory Visit: Payer: Self-pay | Admitting: Family Medicine

## 2011-12-02 ENCOUNTER — Ambulatory Visit
Admission: RE | Admit: 2011-12-02 | Discharge: 2011-12-02 | Disposition: A | Discharge status: Home / Self Care | Payer: Medicaid Other | Source: Ambulatory Visit | Attending: Family Medicine | Admitting: Family Medicine

## 2011-12-02 DIAGNOSIS — Z1231 Encounter for screening mammogram for malignant neoplasm of breast: Secondary | ICD-10-CM

## 2011-12-02 DIAGNOSIS — N63 Unspecified lump in unspecified breast: Secondary | ICD-10-CM

## 2011-12-02 DIAGNOSIS — R928 Other abnormal and inconclusive findings on diagnostic imaging of breast: Secondary | ICD-10-CM

## 2011-12-21 ENCOUNTER — Encounter (HOSPITAL_COMMUNITY): Payer: Self-pay | Admitting: *Deleted

## 2011-12-21 ENCOUNTER — Observation Stay (HOSPITAL_COMMUNITY)
Admission: EM | Admit: 2011-12-21 | Discharge: 2011-12-21 | Disposition: A | Discharge status: Home Health | Payer: Medicaid Other | Attending: Emergency Medicine | Admitting: Emergency Medicine

## 2011-12-21 DIAGNOSIS — M7989 Other specified soft tissue disorders: Secondary | ICD-10-CM | POA: Insufficient documentation

## 2011-12-21 DIAGNOSIS — I10 Essential (primary) hypertension: Secondary | ICD-10-CM | POA: Insufficient documentation

## 2011-12-21 DIAGNOSIS — Z86718 Personal history of other venous thrombosis and embolism: Secondary | ICD-10-CM | POA: Insufficient documentation

## 2011-12-21 DIAGNOSIS — M79609 Pain in unspecified limb: Secondary | ICD-10-CM

## 2011-12-21 DIAGNOSIS — M79606 Pain in leg, unspecified: Secondary | ICD-10-CM

## 2011-12-21 LAB — BASIC METABOLIC PANEL
Calcium: 9.7 mg/dL (ref 8.4–10.5)
GFR calc Af Amer: 70 mL/min — ABNORMAL LOW (ref >90)
GFR calc non Af Amer: 60 mL/min — ABNORMAL LOW (ref >90)
Glucose, Bld: 75 mg/dL (ref 70–99)
Potassium: 4 mEq/L (ref 3.5–5.1)
Sodium: 140 mEq/L (ref 135–145)

## 2011-12-21 LAB — PROTIME-INR
INR: 1.09 (ref 0.00–1.49)
Prothrombin Time: 14.3 seconds (ref 11.6–15.2)

## 2011-12-21 LAB — CBC
Hemoglobin: 14.4 g/dL (ref 12.0–15.0)
MCH: 31.3 pg (ref 26.0–34.0)
MCHC: 34.8 g/dL (ref 30.0–36.0)
Platelets: 164 10*3/uL (ref 150–400)
RBC: 4.6 MIL/uL (ref 3.87–5.11)

## 2011-12-21 LAB — APTT: aPTT: 29 seconds (ref 24–37)

## 2011-12-21 MED ORDER — OXYCODONE-ACETAMINOPHEN 5-325 MG PO TABS
1.0000 | ORAL_TABLET | Freq: Once | ORAL | Status: AC
  Administered 2011-12-21: 1 via ORAL
  Filled 2011-12-21: qty 1

## 2011-12-21 MED ORDER — OXYCODONE-ACETAMINOPHEN 5-325 MG PO TABS: 1.0000 | ORAL_TABLET | ORAL | Status: AC | PRN

## 2011-12-21 MED ORDER — IBUPROFEN 800 MG PO TABS: 800.0000 mg | ORAL_TABLET | Freq: Three times a day (TID) | ORAL | Status: AC

## 2011-12-21 NOTE — Progress Notes (Signed)
PRELIMINARY   PRELIMINARY   Right lower extremity venous duplex has been completed.  Right leg is negative for deep and superficial vein thrombosis.  The peoneal vein  DVT previously noted appears to have resolved. 12/21/2011 Florestine Avers, RVT 2:24 PM

## 2011-12-21 NOTE — Discharge Instructions (Signed)
FOLLOW UP WITH YOUR DOCTOR FOR RECHECK IF NO BETTER IN 3-4 DAYS. WARM COMPRESSES. TAKE MEDICATIONS AS PRESCRIBED.   Heat Therapy Your caregiver advises heat therapy for your condition. Heat applications help reduce pain and muscle spasm around injuries or areas of inflammation. They also increase blood flow to the area which can speed healing. Moist heat is commonly used to help heal skin infections. Heat treatments should be used for about 30-40 minutes every 2-4 hours. Shorter treatments should be used if there is discomfort. Different forms of heat therapy are:  Warm water - Use a basin or tub filled with heated water; change it often to keep the water hot. The water temperature should not be uncomfortable to the skin.   Hot packs - Use several bath towels soaked in hot water and lightly wrung out. These should be changed every 5-10 minutes. You can buy commercially-available packs that provide more sustained heat. Hot water bottles are not recommended because they give only a small amount of heat.   Electric heating pads - These may be used for dry heat only. Do not use wet material around a regular heating pad because of the risk of electrical shock. Do not leave heating pads on for long periods as they can burn the skin or cause permanent discoloration. Do not lie on top of a heating pad because, again, this can cause a burn.   Heat lamps - Use an infrared light. Keep the bulb 15-25 inches from the skin. Watch for signs of excessive heat (blotchy areas will appear).  Be cautious with heat therapy to avoid burning the skin. You should not use heat therapy without careful medical supervision if you have: circulation problems, numbness or unusual swelling in the area to be treated. Document Released: 10/17/2005 Document Revised: 06/29/2011 Document Reviewed: 04/14/2007 [facility] Patient Information 2012 [place], [place].

## 2011-12-21 NOTE — ED Provider Notes (Signed)
Doppler study negative for DVT. Patient can be discharged home and should follow up with primary care this week for recheck.   Rodena Medin, PA-C 12/21/11 1540

## 2011-12-21 NOTE — ED Notes (Signed)
Hx of dvt's. C/o rt calf pain x 2 weeks. Pt has not been taking coumadin per pcp order. Denies sob/cp. Painful with ambulation. No swelling or redness noted.

## 2011-12-21 NOTE — ED Notes (Addendum)
C/o increased pain, requesting analgesia.

## 2011-12-21 NOTE — ED Notes (Signed)
Returned from vascular lab 

## 2011-12-21 NOTE — ED Notes (Signed)
Patient with right calf DVT (history).  Patient was taken off the coumadin in January and is not having pain and discomfort to that right calf and was sent here for DVT rule out

## 2011-12-21 NOTE — ED Provider Notes (Signed)
History     CSN: [id number]  Arrival date & time 12/21/11  8413   First MD Initiated Contact with Patient 12/21/11 1117      Chief Complaint  Patient presents with  . DVT    (Consider location/radiation/quality/duration/timing/severity/associated sxs/prior treatment) HPI Patient presents with complaint of pain and swelling in her right lower extremity. The pain is located in her right calf. She states that she has a history of DVT in her right calf and was on Coumadin for one year. She's been off Coumadin for one month. She states she had pain over the past year but thinks the pain is worse and now is developing swelling in the right lower extremity. She denies any chest pain. She has no significant shortness of breath although does note some mild shortness of breath on exertion but this has been present over the past year. Palpation makes the pain worse. There no other associated systemic symptoms. There no other alleviating or modifying factors. She was seen at her primary doctor's office today and referred to the ED for DVT evaluation.  Past Medical History  Diagnosis Date  . Hypertension   . Deep venous thrombosis of leg     Past Surgical History  Procedure Date  . Abdominal hysterectomy   . Total knee revision     History reviewed. No pertinent family history.  History  Substance Use Topics  . Smoking status: Current Everyday Smoker  . Smokeless tobacco: Not on file  . Alcohol Use: No    OB History    Grav Para Term Preterm Abortions TAB SAB Ect Mult Living                  Review of Systems ROS reviewed and otherwise negative except for mentioned in HPI  Allergies  Codeine; Morphine and related; Naproxen; and Sulfonamide derivatives  Home Medications   Current Outpatient Rx  Name Route Sig Dispense Refill  . ACETAMINOPHEN 325 MG PO TABS Oral Take 650 mg by mouth every 6 (six) hours as needed.    . BUPROPION HCL ER (XL) 150 MG PO TB24 Oral Take 150 mg by  mouth daily.    Marland Kitchen CLONAZEPAM 0.5 MG PO TABS Oral Take 0.5 mg by mouth 3 (three) times daily as needed. For anxiety    . DIVALPROEX SODIUM ER 500 MG PO TB24 Oral Take 1,000 mg by mouth at bedtime.    . OMEPRAZOLE 20 MG PO CPDR Oral Take 20 mg by mouth daily.    Marland Kitchen PROPRANOLOL HCL 10 MG PO TABS Oral Take 10 mg by mouth 3 (three) times daily.    . IBUPROFEN 800 MG PO TABS Oral Take 1 tablet (800 mg total) by mouth 3 (three) times daily. 21 tablet 0  . OXYCODONE-ACETAMINOPHEN 5-325 MG PO TABS Oral Take 1 tablet by mouth every 4 (four) hours as needed for pain. 15 tablet 0    BP 116/81  Pulse 79  Temp(Src) 98.3 F (36.8 C) (Oral)  Resp 16  SpO2 95% Vitals reviewed Physical Exam Physical Examination: General appearance - alert, well appearing, and in no distress Mental status - alert, oriented to person, place, and time Mouth - mucous membranes moist, pharynx normal without lesions Chest - clear to auscultation, no wheezes, rales or rhonchi, symmetric air entry Heart - normal rate, regular rhythm, normal S1, S2, no murmurs, rubs, clicks or gallops Abdomen - soft, nontender, nondistended, no masses or organomegaly Musculoskeletal - no joint tenderness, deformity or swelling Extremities -  peripheral pulses normal, no pedal edema, no clubbing or cyanosis, mild ttp over right posterior calf, no palpable cords, negative homan's sign Skin - normal coloration and turgor, no rashes  ED Course  Procedures (including critical care time)  Labs Reviewed  BASIC METABOLIC PANEL - Abnormal; Notable for the following:    Creatinine, Ser 1.11 (*)    GFR calc non Af Amer 60 (*)    GFR calc Af Amer 70 (*)    All other components within normal limits  CBC  APTT  PROTIME-INR  LAB REPORT - SCANNED   No results found.   1. Lower extremity pain       MDM  Pt with hx of DVT in right lower extrmemity presenting with pain in RLE.  Has finished coumadin tx and been off x 1 month.  Pt moved to CDU for  DVT protocol- no DVT on ultrasound.         Ethelda Chick, MD 12/24/11 [phone]

## 2011-12-21 NOTE — ED Notes (Signed)
Patient transported to Ultrasound 

## 2011-12-21 NOTE — ED Notes (Signed)
Regular Diet Tray ordered  

## 2011-12-23 NOTE — ED Provider Notes (Signed)
Medical screening examination/treatment/procedure(s) were performed by non-physician practitioner and as supervising physician I was immediately available for consultation/collaboration.  Daltyn Degroat R. Jumana Paccione, MD 12/23/11 1143 

## 2011-12-24 ENCOUNTER — Emergency Department (INDEPENDENT_AMBULATORY_CARE_PROVIDER_SITE_OTHER)
Admission: EM | Admit: 2011-12-24 | Discharge: 2011-12-24 | Disposition: A | Discharge status: Home / Self Care | Payer: Medicaid Other | Source: Home / Self Care | Attending: Emergency Medicine | Admitting: Emergency Medicine

## 2011-12-24 ENCOUNTER — Encounter (HOSPITAL_COMMUNITY): Payer: Self-pay | Admitting: *Deleted

## 2011-12-24 DIAGNOSIS — N390 Urinary tract infection, site not specified: Secondary | ICD-10-CM

## 2011-12-24 LAB — POCT URINALYSIS DIP (DEVICE)
Glucose, UA: NEGATIVE mg/dL
Nitrite: NEGATIVE
Protein, ur: >=300 mg/dL — AB
Urobilinogen, UA: 1 mg/dL (ref 0.0–1.0)

## 2011-12-24 MED ORDER — PHENAZOPYRIDINE HCL 95 MG PO TABS: 95.0000 mg | ORAL_TABLET | Freq: Three times a day (TID) | ORAL | Status: AC | PRN

## 2011-12-24 MED ORDER — CEPHALEXIN 500 MG PO CAPS: 500.0000 mg | ORAL_CAPSULE | Freq: Two times a day (BID) | ORAL | Status: AC

## 2011-12-24 NOTE — Discharge Instructions (Signed)
Take the Keflex twice a day. Also buy and take some AZO for pain relief. Followup with your doctor if you are not getting better within one week or sooner if you or worsening  Urinary Tract Infection Infections of the urinary tract can start in several places. A bladder infection (cystitis), a kidney infection (pyelonephritis), and a prostate infection (prostatitis) are different types of urinary tract infections (UTIs). They usually get better if treated with medicines (antibiotics) that kill germs. Take all the medicine until it is gone. You or your child may feel better in a few days, but TAKE ALL MEDICINE or the infection may not respond and may become more difficult to treat. HOME CARE INSTRUCTIONS   Drink enough water and fluids to keep the urine clear or pale yellow. Cranberry juice is especially recommended, in addition to large amounts of water.   Avoid caffeine, tea, and carbonated beverages. They tend to irritate the bladder.   Alcohol may irritate the prostate.   Only take over-the-counter or prescription medicines for pain, discomfort, or fever as directed by your caregiver.  To prevent further infections:  Empty the bladder often. Avoid holding urine for long periods of time.   After a bowel movement, women should cleanse from front to back. Use each tissue only once.   Empty the bladder before and after sexual intercourse.  FINDING OUT THE RESULTS OF YOUR TEST Not all test results are available during your visit. If your or your child's test results are not back during the visit, make an appointment with your caregiver to find out the results. Do not assume everything is normal if you have not heard from your caregiver or the medical facility. It is important for you to follow up on all test results. SEEK MEDICAL CARE IF:   There is back pain.   Your baby is older than 3 months with a rectal temperature of 100.5 F (38.1 C) or higher for more than 1 day.   Your or your  child's problems (symptoms) are no better in 3 days. Return sooner if you or your child is getting worse.  SEEK IMMEDIATE MEDICAL CARE IF:   There is severe back pain or lower abdominal pain.   You or your child develops chills.   You have a fever.   Your baby is older than 3 months with a rectal temperature of 102 F (38.9 C) or higher.   Your baby is 18 months old or younger with a rectal temperature of 100.4 F (38 C) or higher.   There is nausea or vomiting.   There is continued burning or discomfort with urination.  MAKE SURE YOU:   Understand these instructions.   Will watch your condition.   Will get help right away if you are not doing well or get worse.  Document Released: 07/27/2005 Document Revised: 06/29/2011 Document Reviewed: 03/01/2007 [facility] Patient Information 2012 [place], [place].

## 2011-12-24 NOTE — ED Notes (Signed)
Onset of frequent urination/urgency and painful urination yesterday -

## 2011-12-24 NOTE — ED Provider Notes (Signed)
Medical screening examination/treatment/procedure(s) were performed by a resident physician and as supervising physician I was immediately available for consultation/collaboration.  Leslee Home, M.D.   Roque Lias, MD 12/24/11 2038

## 2011-12-24 NOTE — ED Provider Notes (Signed)
[name] is a 41 y.o. female who presents to Urgent Care today for dysuria starting yesterday associated with frequency urgency and burning. She denies any fevers chills abdominal pain or back pain.    PMH reviewed. No prior history of frequent urinary tract infections.  ROS as above otherwise neg Medications reviewed. No current facility-administered medications for this encounter.   Current Outpatient Prescriptions  Medication Sig Dispense Refill  . acetaminophen (TYLENOL) 325 MG tablet Take 650 mg by mouth every 6 (six) hours as needed.      Marland Kitchen buPROPion (WELLBUTRIN XL) 150 MG 24 hr tablet Take 150 mg by mouth daily.      . clonazePAM (KLONOPIN) 0.5 MG tablet Take 0.5 mg by mouth 3 (three) times daily as needed. For anxiety      . divalproex (DEPAKOTE ER) 500 MG 24 hr tablet Take 1,000 mg by mouth at bedtime.      Marland Kitchen ibuprofen (ADVIL,MOTRIN) 800 MG tablet Take 1 tablet (800 mg total) by mouth 3 (three) times daily.  21 tablet  0  . omeprazole (PRILOSEC) 20 MG capsule Take 20 mg by mouth daily.      Marland Kitchen oxyCODONE-acetaminophen (PERCOCET) 5-325 MG per tablet Take 1 tablet by mouth every 4 (four) hours as needed for pain.  15 tablet  0  . propranolol (INDERAL) 10 MG tablet Take 10 mg by mouth 3 (three) times daily.      . cephALEXin (KEFLEX) 500 MG capsule Take 1 capsule (500 mg total) by mouth 2 (two) times daily.  14 capsule  0  . phenazopyridine (PYRIDIUM) 95 MG tablet Take 1 tablet (95 mg total) by mouth 3 (three) times daily as needed for pain.  10 tablet  0    Exam:  BP 108/71  Pulse 74  Temp(Src) 98.3 F (36.8 C) (Oral)  Resp 18  SpO2 96% Gen: Well NAD Lungs: CTABL Nl WOB Heart: RRR no MRG Abd: NABS, NT, ND Exts: Non edematous BL  LE, warm and well perfused.   Urinalysis significant for trace leukocyte esterase  Assessment and Plan: 40 year old woman with likely urinary tract infection. Plan to treat empirically with Keflex twice a day for one week.  Additionally  will treat symptoms of dysuria with Pyridium.  Discussed warning foot signs such as fevers chills abdominal pain or back pain. Patient expresses understanding.     Clementeen Graham, MD 12/24/11 [phone]

## 2012-02-07 ENCOUNTER — Ambulatory Visit: Payer: Self-pay | Admitting: Obstetrics and Gynecology

## 2012-03-06 ENCOUNTER — Ambulatory Visit: Payer: Self-pay | Admitting: Obstetrics and Gynecology

## 2012-03-16 ENCOUNTER — Ambulatory Visit (INDEPENDENT_AMBULATORY_CARE_PROVIDER_SITE_OTHER): Payer: Medicaid Other | Admitting: Obstetrics and Gynecology

## 2012-03-16 ENCOUNTER — Encounter: Payer: Self-pay | Admitting: Obstetrics and Gynecology

## 2012-03-16 VITALS — BP 112/62 | Ht 65.0 in | Wt 206.0 lb

## 2012-03-16 DIAGNOSIS — Z86718 Personal history of other venous thrombosis and embolism: Secondary | ICD-10-CM | POA: Insufficient documentation

## 2012-03-16 DIAGNOSIS — E8940 Asymptomatic postprocedural ovarian failure: Secondary | ICD-10-CM | POA: Insufficient documentation

## 2012-03-16 DIAGNOSIS — N644 Mastodynia: Secondary | ICD-10-CM

## 2012-03-16 DIAGNOSIS — L659 Nonscarring hair loss, unspecified: Secondary | ICD-10-CM

## 2012-03-16 DIAGNOSIS — N9489 Other specified conditions associated with female genital organs and menstrual cycle: Secondary | ICD-10-CM

## 2012-03-16 DIAGNOSIS — N951 Menopausal and female climacteric states: Secondary | ICD-10-CM | POA: Insufficient documentation

## 2012-03-16 DIAGNOSIS — N898 Other specified noninflammatory disorders of vagina: Secondary | ICD-10-CM

## 2012-03-16 HISTORY — DX: Asymptomatic postprocedural ovarian failure: E89.40

## 2012-03-16 LAB — FOLLICLE STIMULATING HORMONE: FSH: 89.6 m[IU]/mL

## 2012-03-16 NOTE — Progress Notes (Signed)
The patient reports:no complaints  Contraception:hysterectomy   Last mammogram: approximate date 03/2011 and was normal  Last pap: approximate date 03/2011 and was normal   GC/Chlamydia cultures offered: declined HIV/RPR/HbsAg offered:  declined HSV 1 and 2 glycoprotein offered: declined  Menstrual cycle regular and monthly: No: Hysterectomy  Menstrual flow normal: No: hysterectomy  Urinary symptoms: none Normal bowel movements: Yes Reports abuse at home: No:   C/o hair loss, hot flashes, though improved with Gabepentin and vaginal dryness, severe bilateral breast tenderness Subjective:    [name] is a 41 y.o. female, G1P1001, who presents for an annual exam and evaluation of the above issues    History   Social History  . Marital Status: Legally Separated    Spouse Name: N/A    Number of Children: N/A  . Years of Education: N/A   Social History Main Topics  . Smoking status: Current Everyday Smoker -- 0.3 packs/day    Types: Cigarettes  . Smokeless tobacco: Never Used  . Alcohol Use: No  . Drug Use: No  . Sexually Active: Yes    Birth Control/ Protection: Surgical     hysterectomy   Other Topics Concern  . None   Social History Narrative  . None    Menstrual cycle:   LMP: No LMP recorded. Patient has had a hysterectomy.             The following portions of the patient's history were reviewed and updated as appropriate: allergies, current medications, past family history, past medical history, past social history, past surgical history and problem list.  Review of Systems Pertinent items are noted in HPI. Breast:Negative for breast lump,nipple discharge or nipple retraction Gastrointestinal: Negative for abdominal pain, change in bowel habits or rectal bleeding Urinary:negative She has achieved a 13 pound weight loss with decreased portion size over the last year   Objective:    BP 112/62  Ht 5\' 5"  (1.651 m)  Wt 206 lb (93.441 kg)  BMI 34.28  kg/m2    Weight:  Wt Readings from Last 1 Encounters:  03/16/12 206 lb (93.441 kg)          BMI: Body mass index is 34.28 kg/(m^2).  General Appearance: Alert, appropriate appearance for age. No acute distress HEENT: Grossly normal Neck / Thyroid: Supple, no masses, nodes or enlargement Lungs: clear to auscultation bilaterally Back: No CVA tenderness Breast Exam: bilateral fibrocystic changes and No masses or nodes.No dimpling, nipple retraction or discharge. Cardiovascular: Regular rate and rhythm. S1, S2, no murmur Gastrointestinal: Soft, non-tender, no masses or organomegaly Pelvic Exam: External genitalia: normal general appearance Vaginal: normal mucosa without prolapse or lesions and vaginal vault, healed and well suspended Cervix: removed surgically Adnexa: non palpable Uterus: removed surgically Rectovaginal: normal rectal, no masses Lymphatic Exam: Non-palpable nodes in neck, clavicular, axillary, or inguinal regions Skin: no rash or abnormalities Neurologic: Normal gait and speech, no tremor  Psychiatric: Alert and oriented, appropriate affect.   Wet Prep:not applicable Urinalysis:not applicable UPT: Not done   Assessment:    Hair loss  Hypothyroidism now euthyroid on current regimen Hot lashes, improved with gabapentin Plan:    mammogram return annually or prn STD screening: declined Contraception:hyst FSH and estradiol testing today.  If all normal and not menopausal patient will be sent for a dermatology referral      [facility] PMD

## 2012-03-16 NOTE — Patient Instructions (Signed)

## 2012-03-17 ENCOUNTER — Other Ambulatory Visit: Payer: Self-pay | Admitting: Obstetrics and Gynecology

## 2012-03-19 LAB — VITAMIN D 1,25 DIHYDROXY: Vitamin D2 1, 25 (OH)2: <8 pg/mL

## 2012-03-20 NOTE — Telephone Encounter (Signed)
Gabapentin 100mg  3 tabs po qd #90 with 12 refills

## 2012-03-20 NOTE — Telephone Encounter (Signed)
See rx req 

## 2012-03-21 ENCOUNTER — Other Ambulatory Visit: Payer: Self-pay | Admitting: Obstetrics and Gynecology

## 2012-03-22 NOTE — Telephone Encounter (Signed)
EP CAN THIS PT HAVE THIS MED

## 2012-04-03 ENCOUNTER — Telehealth: Payer: Self-pay

## 2012-04-03 MED ORDER — VITAMIN D (ERGOCALCIFEROL) 1.25 MG (50000 UNIT) PO CAPS: ORAL_CAPSULE | ORAL | Status: DC

## 2012-04-03 NOTE — Telephone Encounter (Signed)
Tc from pt. Told pt FSH and Estradiol=menopause. Vit D-24. Needs Vit D protocol per vph. Rx Vit D 50K e-pres to pharm on file. Pt put on recall list for 12 weeks reminder for repeat vit d. Pt voices understanding.

## 2012-04-03 NOTE — Telephone Encounter (Signed)
Lm on vm to cb per test results.  

## 2012-04-17 ENCOUNTER — Emergency Department (HOSPITAL_COMMUNITY): Payer: Medicaid Other

## 2012-04-17 ENCOUNTER — Encounter (HOSPITAL_COMMUNITY): Payer: Self-pay | Admitting: Emergency Medicine

## 2012-04-17 ENCOUNTER — Inpatient Hospital Stay (HOSPITAL_COMMUNITY)
Admission: EM | Admit: 2012-04-17 | Discharge: 2012-04-21 | DRG: 641 | Disposition: A | Discharge status: Home / Self Care | Payer: Medicaid Other | Attending: Internal Medicine | Admitting: Internal Medicine

## 2012-04-17 ENCOUNTER — Other Ambulatory Visit: Payer: Self-pay

## 2012-04-17 ENCOUNTER — Emergency Department (HOSPITAL_COMMUNITY)
Admission: EM | Admit: 2012-04-17 | Discharge: 2012-04-17 | Disposition: A | Discharge status: Home / Self Care | Payer: Medicaid Other | Source: Home / Self Care | Attending: Emergency Medicine | Admitting: Emergency Medicine

## 2012-04-17 ENCOUNTER — Encounter (HOSPITAL_COMMUNITY): Payer: Self-pay | Admitting: *Deleted

## 2012-04-17 DIAGNOSIS — R519 Headache, unspecified: Secondary | ICD-10-CM | POA: Diagnosis present

## 2012-04-17 DIAGNOSIS — F3130 Bipolar disorder, current episode depressed, mild or moderate severity, unspecified: Secondary | ICD-10-CM | POA: Diagnosis present

## 2012-04-17 DIAGNOSIS — E039 Hypothyroidism, unspecified: Secondary | ICD-10-CM | POA: Diagnosis present

## 2012-04-17 DIAGNOSIS — I10 Essential (primary) hypertension: Secondary | ICD-10-CM | POA: Diagnosis present

## 2012-04-17 DIAGNOSIS — E162 Hypoglycemia, unspecified: Principal | ICD-10-CM | POA: Diagnosis present

## 2012-04-17 DIAGNOSIS — R51 Headache: Secondary | ICD-10-CM | POA: Diagnosis present

## 2012-04-17 DIAGNOSIS — Z79899 Other long term (current) drug therapy: Secondary | ICD-10-CM

## 2012-04-17 DIAGNOSIS — E785 Hyperlipidemia, unspecified: Secondary | ICD-10-CM | POA: Diagnosis present

## 2012-04-17 DIAGNOSIS — F17200 Nicotine dependence, unspecified, uncomplicated: Secondary | ICD-10-CM | POA: Diagnosis present

## 2012-04-17 DIAGNOSIS — R55 Syncope and collapse: Secondary | ICD-10-CM | POA: Diagnosis present

## 2012-04-17 DIAGNOSIS — Z86718 Personal history of other venous thrombosis and embolism: Secondary | ICD-10-CM

## 2012-04-17 DIAGNOSIS — F319 Bipolar disorder, unspecified: Secondary | ICD-10-CM | POA: Diagnosis present

## 2012-04-17 DIAGNOSIS — R5383 Other fatigue: Secondary | ICD-10-CM

## 2012-04-17 HISTORY — DX: Bipolar disorder, unspecified: F31.9

## 2012-04-17 LAB — GLUCOSE, CAPILLARY: Glucose-Capillary: 33 mg/dL — CL (ref 70–99)

## 2012-04-17 LAB — COMPREHENSIVE METABOLIC PANEL
ALT: 8 U/L (ref 0–35)
Albumin: 3.5 g/dL (ref 3.5–5.2)
Alkaline Phosphatase: 45 U/L (ref 39–117)
Potassium: 3.8 mEq/L (ref 3.5–5.1)
Sodium: 140 mEq/L (ref 135–145)
Total Protein: 6.5 g/dL (ref 6.0–8.3)

## 2012-04-17 LAB — CBC
MCHC: 34.7 g/dL (ref 30.0–36.0)
Platelets: 152 10*3/uL (ref 150–400)
RDW: 12.8 % (ref 11.5–15.5)

## 2012-04-17 MED ORDER — ACETAMINOPHEN 325 MG PO TABS: 975.0000 mg | ORAL_TABLET | Freq: Once | ORAL | Status: DC

## 2012-04-17 MED ORDER — ONDANSETRON HCL 4 MG/2ML IJ SOLN
4.0000 mg | Freq: Once | INTRAMUSCULAR | Status: AC
  Administered 2012-04-17: 4 mg via INTRAVENOUS
  Filled 2012-04-17: qty 2

## 2012-04-17 MED ORDER — ACETAMINOPHEN 500 MG PO TABS
1000.0000 mg | ORAL_TABLET | Freq: Once | ORAL | Status: DC
  Administered 2012-04-17: 975 mg via ORAL
  Filled 2012-04-17: qty 2

## 2012-04-17 MED ORDER — FENTANYL CITRATE 0.05 MG/ML IJ SOLN
50.0000 ug | Freq: Once | INTRAMUSCULAR | Status: AC
  Administered 2012-04-17: 50 ug via INTRAVENOUS
  Filled 2012-04-17: qty 2

## 2012-04-17 MED ORDER — DEXTROSE 50 % IV SOLN
25.0000 g | Freq: Once | INTRAVENOUS | Status: AC
  Administered 2012-04-17: 25 g via INTRAVENOUS
  Filled 2012-04-17: qty 50

## 2012-04-17 MED ORDER — ACETAMINOPHEN 325 MG PO TABS
ORAL_TABLET | ORAL | Status: AC
  Administered 2012-04-17: 975 mg via ORAL
  Filled 2012-04-17: qty 3

## 2012-04-17 NOTE — ED Notes (Signed)
RUE:AV40<JW> Expected date:<BR> Expected time: 8:32 PM<BR> Means of arrival:<BR> Comments:<BR> M50 - 41yoF Weak, fell to knees, near syncope

## 2012-04-17 NOTE — Discharge Instructions (Signed)
Return for worsening of symptoms, otherwise please follow with your PCP  Fatigue Fatigue is a feeling of tiredness, lack of energy, lack of motivation, or feeling tired all the time. Having enough rest, good nutrition, and reducing stress will normally reduce fatigue. Consult your caregiver if it persists. The nature of your fatigue will help your caregiver to find out its cause. The treatment is based on the cause.  CAUSES  There are many causes for fatigue. Most of the time, fatigue can be traced to one or more of your habits or routines. Most causes fit into one or more of three general areas. They are: Lifestyle problems  Sleep disturbances.   Overwork.   Physical exertion.   Unhealthy habits.   Poor eating habits or eating disorders.   Alcohol and/or drug use .   Lack of proper nutrition (malnutrition).  Psychological problems  Stress and/or anxiety problems.   Depression.   Grief.   Boredom.  Medical Problems or Conditions  Anemia.   Pregnancy.   Thyroid gland problems.   Recovery from major surgery.   Continuous pain.   Emphysema or asthma that is not well controlled   Allergic conditions.   Diabetes.   Infections (such as mononucleosis).   Obesity.   Sleep disorders, such as sleep apnea.   Heart failure or other heart-related problems.   Cancer.   Kidney disease.   Liver disease.   Effects of certain medicines such as antihistamines, cough and cold remedies, prescription pain medicines, heart and blood pressure medicines, drugs used for treatment of cancer, and some antidepressants.  SYMPTOMS  The symptoms of fatigue include:   Lack of energy.   Lack of drive (motivation).   Drowsiness.   Feeling of indifference to the surroundings.  DIAGNOSIS  The details of how you feel help guide your caregiver in finding out what is causing the fatigue. You will be asked about your present and past health condition. It is important to review all  medicines that you take, including prescription and non-prescription items. A thorough exam will be done. You will be questioned about your feelings, habits, and normal lifestyle. Your caregiver may suggest blood tests, urine tests, or other tests to look for common medical causes of fatigue.  TREATMENT  Fatigue is treated by correcting the underlying cause. For example, if you have continuous pain or depression, treating these causes will improve how you feel. Similarly, adjusting the dose of certain medicines will help in reducing fatigue.  HOME CARE INSTRUCTIONS   Try to get the required amount of good sleep every night.   Eat a healthy and nutritious diet, and drink enough water throughout the day.   Practice ways of relaxing (including yoga or meditation).   Exercise regularly.   Make plans to change situations that cause stress. Act on those plans so that stresses decrease over time. Keep your work and personal routine reasonable.   Avoid street drugs and minimize use of alcohol.   Start taking a daily multivitamin after consulting your caregiver.  SEEK MEDICAL CARE IF:   You have persistent tiredness, which cannot be accounted for.   You have fever.   You have unintentional weight loss.   You have headaches.   You have disturbed sleep throughout the night.   You are feeling sad.   You have constipation.   You have dry skin.   You have gained weight.   You are taking any new or different medicines that you suspect are causing  fatigue.   You are unable to sleep at night.   You develop any unusual swelling of your legs or other parts of your body.  SEEK IMMEDIATE MEDICAL CARE IF:   You are feeling confused.   Your vision is blurred.   You feel faint or pass out.   You develop severe headache.   You develop severe abdominal, pelvic, or back pain.   You develop chest pain, shortness of breath, or an irregular or fast heartbeat.   You are unable to pass a  normal amount of urine.   You develop abnormal bleeding such as bleeding from the rectum or you vomit blood.   You have thoughts about harming yourself or committing suicide.   You are worried that you might harm someone else.  MAKE SURE YOU:   Understand these instructions.   Will watch your condition.   Will get help right away if you are not doing well or get worse.  Document Released: 08/14/2007 Document Revised: 10/06/2011 Document Reviewed: 08/14/2007 [facility] Patient Information 2012 [place], [place].

## 2012-04-17 NOTE — ED Notes (Signed)
PT presents stating she is in menopause and has been feeling unwell, tired and is doing things that are not typical of her self. Pt reports she has bipolar and depression but these symptoms are unlike any of these she has had before. Reports BP low as well.

## 2012-04-17 NOTE — ED Provider Notes (Addendum)
History     CSN: [id number]  Arrival date & time 04/17/12  2043   First MD Initiated Contact with Patient 04/17/12 2145      Chief Complaint  Patient presents with  . Fall    (Consider location/radiation/quality/duration/timing/severity/associated sxs/prior treatment) HPI  Patient presents to the ED with complaints of near syncopal episode. Patient was seen in the ED yesterday for the same and discharged. Today she was at home, felt weak, hear knees gave out. EMS upon arrival found her sugar to be 33. Pt given an amp of D50 and her sugar responded well and came back up to 120. The patient admits to not eating today. She admits to having had memory problems the past couple of days. SHe has a PMH of hypertension and hypothyroid. She denies being a diabetic or having this problem ever before. She presents to the ED on LSB and CC. She admits to neck pain and head pain, as well as chest pain. Pt is NAD at the time and VSS.  Past Medical History  Diagnosis Date  . Hypertension   . Deep venous thrombosis of leg   . Bacterial vaginosis 07-27-11  . Yeast infection   . Hx of bacterial infection   . Measles   . Hx of measles   . Migraines   . Hx of migraines   . History of chicken pox   . Hx: UTI (urinary tract infection)   . Vestibulitis of vagina 03/04/11  . Monilia infection 12/31/10  . Vaginal atrophy 03/04/11  . Dyspareunia 03/04/11  . Chronic pelvic pain in female 08/14/2003  . Depression   . Bipolar 1 disorder     Past Surgical History  Procedure Date  . Total knee revision     right knee  . Cesarean section 1998  . Tonsillectomy   . Wisdom tooth extraction   . Cholecystectomy   . Left elbow   . Abdominal hysterectomy 2000    Family History  Problem Relation Age of Onset  . Cancer Maternal Aunt     History  Substance Use Topics  . Smoking status: Current Everyday Smoker -- 0.3 packs/day    Types: Cigarettes  . Smokeless tobacco: Never Used  . Alcohol Use: No    OB  History    Grav Para Term Preterm Abortions TAB SAB Ect Mult Living   1 1 1       1       Review of Systems   HEENT: denies blurry vision or change in hearing PULMONARY: Denies difficulty breathing and SOB CARDIAC: denies chest pain or heart palpitations MUSCULOSKELETAL:  denies being unable to ambulate ABDOMEN AL: denies abdominal pain GU: denies loss of bowel or urinary control NEURO: denies numbness and tingling in extremities SKIN: no new rashes PSYCH: patient behavior is normal NECK: no prior neck pain     Allergies  Codeine; Morphine and related; Naproxen; and Sulfonamide derivatives  Home Medications   Current Outpatient Rx  Name Route Sig Dispense Refill  . ARIPIPRAZOLE 5 MG PO TABS Oral Take 7.5 mg by mouth daily.    . BUPROPION HCL ER (XL) 150 MG PO TB24 Oral Take 150 mg by mouth daily.    Marland Kitchen CLONAZEPAM 0.5 MG PO TABS Oral Take 0.5 mg by mouth 3 (three) times daily as needed. For anxiety    . DIVALPROEX SODIUM ER 500 MG PO TB24 Oral Take 1,000 mg by mouth at bedtime.    Marland Kitchen GABAPENTIN 100 MG PO CAPS  Oral Take 300 mg by mouth daily.    Marland Kitchen LEVOTHYROXINE SODIUM 100 MCG PO TABS Oral Take 100 mcg by mouth daily.    . OMEGA-3-ACID ETHYL ESTERS 1 G PO CAPS Oral Take 2 g by mouth 2 (two) times daily.    Marland Kitchen PROPRANOLOL HCL 10 MG PO TABS Oral Take 10 mg by mouth 2 (two) times daily.     Marland Kitchen SIMVASTATIN 40 MG PO TABS Oral Take 40 mg by mouth every evening.    Marland Kitchen TRAMADOL HCL 50 MG PO TABS Oral Take 50 mg by mouth every 6 (six) hours as needed. For pain    . TRAZODONE HCL 100 MG PO TABS Oral Take 200 mg by mouth at bedtime.       BP 123/86  Pulse 69  Temp 98 F (36.7 C) (Oral)  Resp 16  SpO2 96%  Physical Exam  Nursing note and vitals reviewed. Constitutional: She appears well-developed and well-nourished. No distress.  HENT:  Head: Normocephalic and atraumatic.  Eyes: Pupils are equal, round, and reactive to light.  Neck: Trachea normal and normal range of motion. Neck  supple.  Cardiovascular: Normal rate and regular rhythm.   Pulmonary/Chest: Effort normal.  Abdominal: Soft.  Musculoskeletal:        Equal strength to bilateral lower extremities. Neurosensory  function adequate to both legs. Skin color is normal. Skin is warm and moist. I see no step off deformity, no bony tenderness. Pt is able to ambulate without limp. Pain is relieved when sitting in certain positions. ROM is decreased due to pain. No crepitus, laceration, effusion, swelling.  Pulses are normal   Neurological: She is alert.  Skin: Skin is warm and dry.    ED Course  Procedures (including critical care time)  Labs Reviewed  GLUCOSE, CAPILLARY - Abnormal; Notable for the following:    Glucose-Capillary 33 (*)     All other components within normal limits  COMPREHENSIVE METABOLIC PANEL - Abnormal; Notable for the following:    Creatinine, Ser 1.17 (*)     GFR calc non Af Amer 57 (*)     GFR calc Af Amer 66 (*)     All other components within normal limits  GLUCOSE, CAPILLARY - Abnormal; Notable for the following:    Glucose-Capillary 120 (*)     All other components within normal limits  CBC  POCT I-STAT TROPONIN I   Ct Head Wo Contrast  04/17/2012  *RADIOLOGY REPORT*  Clinical Data:  Status post fall, with head injury.  Amnesia. Concern for cervical spine injury.  CT HEAD WITHOUT CONTRAST AND CT CERVICAL SPINE WITHOUT CONTRAST  Technique:  Multidetector CT imaging of the head and cervical spine was performed following the standard protocol without intravenous contrast.  Multiplanar CT image reconstructions of the cervical spine were also generated.  Comparison: CT of the head performed 10/26/2004, and cervical spine radiographs performed 03/16/2004  CT HEAD  Findings: There is no evidence of acute infarction, mass lesion, or intra- or extra-axial hemorrhage on CT.  Cerebellar atrophy is noted.  The brainstem and fourth ventricle are within normal limits.  The third and lateral  ventricles, and basal ganglia are unremarkable in appearance.  The cerebral hemispheres are symmetric in appearance, with normal gray-white differentiation.  No mass effect or midline shift is seen.  There is no evidence of fracture; visualized osseous structures are unremarkable in appearance.  The orbits are within normal limits. The paranasal sinuses and mastoid air cells are well-aerated.  No significant soft tissue abnormalities are seen.  IMPRESSION: No evidence of traumatic intracranial injury or fracture.  CT CERVICAL SPINE  Findings: There is no evidence of fracture or subluxation. Vertebral bodies demonstrate normal height and alignment. Intervertebral disc spaces are preserved.  Prevertebral soft tissues are within normal limits.  The visualized neural foramina are grossly unremarkable.  The thyroid gland is markedly diminutive and not well characterized.  The minimally visualized lung apices are clear.  No significant soft tissue abnormalities are seen.  IMPRESSION: No evidence of fracture or subluxation along the cervical spine.  Original Report Authenticated By: Tonia Ghent, M.D.   Ct Cervical Spine Wo Contrast  04/17/2012  *RADIOLOGY REPORT*  Clinical Data:  Status post fall, with head injury.  Amnesia. Concern for cervical spine injury.  CT HEAD WITHOUT CONTRAST AND CT CERVICAL SPINE WITHOUT CONTRAST  Technique:  Multidetector CT imaging of the head and cervical spine was performed following the standard protocol without intravenous contrast.  Multiplanar CT image reconstructions of the cervical spine were also generated.  Comparison: CT of the head performed 10/26/2004, and cervical spine radiographs performed 03/16/2004  CT HEAD  Findings: There is no evidence of acute infarction, mass lesion, or intra- or extra-axial hemorrhage on CT.  Cerebellar atrophy is noted.  The brainstem and fourth ventricle are within normal limits.  The third and lateral ventricles, and basal ganglia are  unremarkable in appearance.  The cerebral hemispheres are symmetric in appearance, with normal gray-white differentiation.  No mass effect or midline shift is seen.  There is no evidence of fracture; visualized osseous structures are unremarkable in appearance.  The orbits are within normal limits. The paranasal sinuses and mastoid air cells are well-aerated.  No significant soft tissue abnormalities are seen.  IMPRESSION: No evidence of traumatic intracranial injury or fracture.  CT CERVICAL SPINE  Findings: There is no evidence of fracture or subluxation. Vertebral bodies demonstrate normal height and alignment. Intervertebral disc spaces are preserved.  Prevertebral soft tissues are within normal limits.  The visualized neural foramina are grossly unremarkable.  The thyroid gland is markedly diminutive and not well characterized.  The minimally visualized lung apices are clear.  No significant soft tissue abnormalities are seen.  IMPRESSION: No evidence of fracture or subluxation along the cervical spine.  Original Report Authenticated By: Tonia Ghent, M.D.     1. Hypoglycemia   2. Near syncope       MDM  Patient was removed from LSB by myself and C-collar removed after CT scans.   Patient remained stable during her ED visit. Labs have come back WNL. No cause for hypoglycemia found. Patient given Fentanyl in ED for pain management.  Have admitted to hospitalist for further work-up of hypoglycemia.  Team 3, Tele, [place], Dr. Richardo Hanks, PA 04/18/12 0010  Dorthula Matas, PA 04/20/12 [phone]

## 2012-04-17 NOTE — ED Notes (Signed)
Per EMS: pt was standing outside two hours ago, knees gave away and hit head on concrete porch. Fall was witness by family. Family states pt passed out. Pt c/o of right side head pain, lumbar, right flank, right hip pain. Pt is A&O x 4 upon arrival. No bleeding, no notable bruising. No blurry vision reported, no n/v.

## 2012-04-17 NOTE — ED Notes (Signed)
Pt reports smoking a cigarette on her porch then coming to lying on the ground. Pt states she her "legs went to jelly and she fell out." pt was unconscious for approximately 7-10 min per family. pt states she was seen [facility] early this morning for similar situation. cbg on assessment was 33.

## 2012-04-17 NOTE — ED Provider Notes (Signed)
History     CSN: [id number]  Arrival date & time 04/17/12  0344   First MD Initiated Contact with Patient 04/17/12 [phone]      Chief Complaint  Patient presents with  . Fatigue    (Consider location/radiation/quality/duration/timing/severity/associated sxs/prior treatment) Patient is a 41 y.o. female presenting with weakness. The history is provided by the patient. The history is limited by a language barrier.  Weakness The primary symptoms include headaches. Primary symptoms do not include visual change. The symptoms began 6 to 12 hours ago. The episode lasted 10 minutes. The symptoms are resolved. The neurological symptoms are diffuse.  The headache is associated with weakness. The headache is not associated with visual change.  Additional symptoms include weakness. Medical issues also include hypertension.   41 y/o female INAD c/o "being woken from sleep by weakness" at 3 AM today. Pt awoke and felt weak in all 4 extremities x10 minutes now resolved. Denies unilateral weakness, change in vision, SOB. Pt has bilateral frontal HA 7/10 which is typical of her standard HA.   Past Medical History  Diagnosis Date  . Hypertension   . Deep venous thrombosis of leg   . Bacterial vaginosis 07-27-11  . Yeast infection   . Hx of bacterial infection   . Measles   . Hx of measles   . Migraines   . Hx of migraines   . History of chicken pox   . Hx: UTI (urinary tract infection)   . Vestibulitis of vagina 03/04/11  . Monilia infection 12/31/10  . Vaginal atrophy 03/04/11  . Dyspareunia 03/04/11  . Chronic pelvic pain in female 08/14/2003  . Depression   . Bipolar 1 disorder     Past Surgical History  Procedure Date  . Total knee revision     right knee  . Cesarean section 1998  . Tonsillectomy   . Wisdom tooth extraction   . Cholecystectomy   . Left elbow   . Abdominal hysterectomy 2000    Family History  Problem Relation Age of Onset  . Cancer Maternal Aunt     History    Substance Use Topics  . Smoking status: Current Everyday Smoker -- 0.3 packs/day    Types: Cigarettes  . Smokeless tobacco: Never Used  . Alcohol Use: No    OB History    Grav Para Term Preterm Abortions TAB SAB Ect Mult Living   1 1 1       1       Review of Systems  Neurological: Positive for weakness and headaches.  All other systems reviewed and are negative.    Allergies  Codeine; Morphine and related; Naproxen; and Sulfonamide derivatives  Home Medications   Current Outpatient Rx  Name Route Sig Dispense Refill  . ARIPIPRAZOLE 5 MG PO TABS Oral Take 7.5 mg by mouth daily.    . BUPROPION HCL ER (XL) 150 MG PO TB24 Oral Take 150 mg by mouth daily.    Marland Kitchen CLONAZEPAM 0.5 MG PO TABS Oral Take 0.5 mg by mouth 3 (three) times daily as needed. For anxiety    . DIVALPROEX SODIUM ER 500 MG PO TB24 Oral Take 1,000 mg by mouth at bedtime.    Marland Kitchen GABAPENTIN 100 MG PO CAPS Oral Take 300 mg by mouth daily.    Marland Kitchen LEVOTHYROXINE SODIUM 100 MCG PO TABS Oral Take 100 mcg by mouth daily.    . OMEGA-3-ACID ETHYL ESTERS 1 G PO CAPS Oral Take 2 g by mouth 2 (two)  times daily.    Marland Kitchen PROPRANOLOL HCL 10 MG PO TABS Oral Take 10 mg by mouth 3 (three) times daily.    Marland Kitchen SIMVASTATIN 40 MG PO TABS Oral Take 40 mg by mouth every evening.    Marland Kitchen TRAMADOL HCL 50 MG PO TABS Oral Take 50 mg by mouth every 6 (six) hours as needed. For pain    . TRAZODONE HCL 100 MG PO TABS Oral Take 100 mg by mouth at bedtime.      BP 111/74  Pulse 63  Temp 97.8 F (36.6 C)  Resp 18  SpO2 100%  Physical Exam  Constitutional: She is oriented to person, place, and time. She appears well-developed and well-nourished.  HENT:  Head: Normocephalic and atraumatic.  Eyes: Conjunctivae and EOM are normal. Pupils are equal, round, and reactive to light.  Neck: Normal range of motion. Neck supple.  Cardiovascular: Normal rate, regular rhythm and normal heart sounds.   Pulmonary/Chest: Effort normal and breath sounds normal.   Abdominal: Soft. Bowel sounds are normal.  Musculoskeletal: Normal range of motion.  Neurological: She is alert and oriented to person, place, and time. She has normal reflexes.       Pt oriented and conversant.  CN 3-12 intact with 5/5 strength and sensation intact  Skin: Skin is warm and dry.  Psychiatric:       Denies suicidal or homicidal ideation    ED Course  Procedures (including critical care time)  Labs Reviewed - No data to display No results found.   1. Fatigue       MDM  41 y/o female iNAD c/o 10 minutes of diffuse weakness now resolved. Neurological exam is normal with equal strength and intact sensation with Pt AxOx3. Advised Pt of normal PE and to follow with PCP. Pt verbalized understanding and agreed with care plan.         Wynetta Emery, PA-C 04/17/12 [phone]

## 2012-04-17 NOTE — ED Notes (Signed)
Patient with symptoms of menopause.  Patient states she feels like she is "not doing typical things",  "son has to pick me up off the floor".  Patient is CAOx3, here in department.

## 2012-04-17 NOTE — ED Provider Notes (Signed)
Medical screening examination/treatment/procedure(s) were performed by non-physician practitioner and as supervising physician I was immediately available for consultation/collaboration.    Vida Roller, MD 04/17/12 [id number]

## 2012-04-17 NOTE — ED Notes (Signed)
Patient transported to CT 

## 2012-04-18 ENCOUNTER — Encounter (HOSPITAL_COMMUNITY): Payer: Self-pay | Admitting: Internal Medicine

## 2012-04-18 DIAGNOSIS — E162 Hypoglycemia, unspecified: Secondary | ICD-10-CM

## 2012-04-18 DIAGNOSIS — R55 Syncope and collapse: Secondary | ICD-10-CM

## 2012-04-18 DIAGNOSIS — Z8659 Personal history of other mental and behavioral disorders: Secondary | ICD-10-CM

## 2012-04-18 DIAGNOSIS — I517 Cardiomegaly: Secondary | ICD-10-CM

## 2012-04-18 DIAGNOSIS — E032 Hypothyroidism due to medicaments and other exogenous substances: Secondary | ICD-10-CM

## 2012-04-18 LAB — CARDIAC PANEL(CRET KIN+CKTOT+MB+TROPI)
CK, MB: 0.9 ng/mL (ref 0.3–4.0)
Total CK: 46 U/L (ref 7–177)
Troponin I: <0.3 ng/mL (ref <0.30)

## 2012-04-18 LAB — URINALYSIS, ROUTINE W REFLEX MICROSCOPIC
Glucose, UA: 100 mg/dL — AB
Hgb urine dipstick: NEGATIVE
pH: 7 (ref 5.0–8.0)

## 2012-04-18 LAB — GLUCOSE, CAPILLARY
Glucose-Capillary: 81 mg/dL (ref 70–99)
Glucose-Capillary: 89 mg/dL (ref 70–99)

## 2012-04-18 LAB — URINE MICROSCOPIC-ADD ON

## 2012-04-18 LAB — VALPROIC ACID LEVEL: Valproic Acid Lvl: 42.7 ug/mL — ABNORMAL LOW (ref 50.0–100.0)

## 2012-04-18 LAB — RAPID URINE DRUG SCREEN, HOSP PERFORMED
Amphetamines: POSITIVE — AB
Tetrahydrocannabinol: NOT DETECTED

## 2012-04-18 MED ORDER — CLONAZEPAM 0.5 MG PO TABS
0.5000 mg | ORAL_TABLET | Freq: Three times a day (TID) | ORAL | Status: DC | PRN
  Administered 2012-04-18 – 2012-04-21 (×5): 0.5 mg via ORAL
  Filled 2012-04-18 (×5): qty 1

## 2012-04-18 MED ORDER — SODIUM CHLORIDE 0.9 % IV SOLN
INTRAVENOUS | Status: DC
  Administered 2012-04-18 – 2012-04-19 (×3): via INTRAVENOUS

## 2012-04-18 MED ORDER — ONDANSETRON HCL 4 MG/2ML IJ SOLN
4.0000 mg | Freq: Four times a day (QID) | INTRAMUSCULAR | Status: DC | PRN
  Administered 2012-04-19 – 2012-04-20 (×5): 4 mg via INTRAVENOUS
  Filled 2012-04-18 (×5): qty 2

## 2012-04-18 MED ORDER — OMEGA-3-ACID ETHYL ESTERS 1 G PO CAPS
2.0000 g | ORAL_CAPSULE | Freq: Two times a day (BID) | ORAL | Status: DC
  Administered 2012-04-18 – 2012-04-21 (×7): 2 g via ORAL
  Filled 2012-04-18 (×8): qty 2

## 2012-04-18 MED ORDER — LEVOTHYROXINE SODIUM 100 MCG PO TABS
100.0000 ug | ORAL_TABLET | Freq: Every day | ORAL | Status: DC
  Administered 2012-04-18 – 2012-04-21 (×4): 100 ug via ORAL
  Filled 2012-04-18 (×5): qty 1

## 2012-04-18 MED ORDER — SIMVASTATIN 40 MG PO TABS
40.0000 mg | ORAL_TABLET | Freq: Every evening | ORAL | Status: DC
  Administered 2012-04-18 – 2012-04-19 (×2): 40 mg via ORAL
  Filled 2012-04-18 (×4): qty 1

## 2012-04-18 MED ORDER — SODIUM CHLORIDE 0.9 % IJ SOLN
3.0000 mL | Freq: Two times a day (BID) | INTRAMUSCULAR | Status: DC
  Administered 2012-04-19 – 2012-04-21 (×2): 3 mL via INTRAVENOUS

## 2012-04-18 MED ORDER — HYDROCODONE-ACETAMINOPHEN 5-325 MG PO TABS
1.0000 | ORAL_TABLET | Freq: Once | ORAL | Status: AC
  Administered 2012-04-18: 1 via ORAL
  Filled 2012-04-18: qty 1

## 2012-04-18 MED ORDER — ACETAMINOPHEN 650 MG RE SUPP: 650.0000 mg | Freq: Four times a day (QID) | RECTAL | Status: DC | PRN

## 2012-04-18 MED ORDER — TRAZODONE HCL 100 MG PO TABS
200.0000 mg | ORAL_TABLET | Freq: Every day | ORAL | Status: DC
  Administered 2012-04-18 – 2012-04-20 (×3): 200 mg via ORAL
  Filled 2012-04-18 (×4): qty 2

## 2012-04-18 MED ORDER — GABAPENTIN 300 MG PO CAPS
300.0000 mg | ORAL_CAPSULE | Freq: Every day | ORAL | Status: DC
  Administered 2012-04-18 – 2012-04-21 (×4): 300 mg via ORAL
  Filled 2012-04-18 (×4): qty 1

## 2012-04-18 MED ORDER — DIVALPROEX SODIUM ER 500 MG PO TB24
1000.0000 mg | ORAL_TABLET | Freq: Every day | ORAL | Status: DC
  Administered 2012-04-18 – 2012-04-20 (×3): 1000 mg via ORAL
  Filled 2012-04-18 (×4): qty 2

## 2012-04-18 MED ORDER — TRAMADOL HCL 50 MG PO TABS
50.0000 mg | ORAL_TABLET | Freq: Four times a day (QID) | ORAL | Status: DC | PRN
  Administered 2012-04-18 – 2012-04-19 (×5): 50 mg via ORAL
  Filled 2012-04-18 (×5): qty 1

## 2012-04-18 MED ORDER — ACETAMINOPHEN 325 MG PO TABS: 650.0000 mg | ORAL_TABLET | Freq: Four times a day (QID) | ORAL | Status: DC | PRN

## 2012-04-18 MED ORDER — BUPROPION HCL ER (XL) 150 MG PO TB24
150.0000 mg | ORAL_TABLET | Freq: Every day | ORAL | Status: DC
  Administered 2012-04-18 – 2012-04-21 (×4): 150 mg via ORAL
  Filled 2012-04-18 (×4): qty 1

## 2012-04-18 MED ORDER — ONDANSETRON HCL 4 MG PO TABS
4.0000 mg | ORAL_TABLET | Freq: Four times a day (QID) | ORAL | Status: DC | PRN
  Administered 2012-04-21: 4 mg via ORAL
  Filled 2012-04-18: qty 1

## 2012-04-18 MED ORDER — ARIPIPRAZOLE 15 MG PO TABS
7.5000 mg | ORAL_TABLET | Freq: Every day | ORAL | Status: DC
  Administered 2012-04-18 – 2012-04-21 (×4): 7.5 mg via ORAL
  Filled 2012-04-18 (×4): qty 1

## 2012-04-18 NOTE — H&P (Signed)
[name] is an 41 y.o. female.   PCP - [facility]. Chief Complaint: Loss of consciousness. HPI: 41 year-old female with history of hypothyroidism, bipolar disorder, history of migraine was brought to the ER after patient was found to have an episode of loss of consciousness. She was at her home and around 7 PM patient suddenly lost consciousness. Prior to that patient had mild headache but denies any palpitation shortness of breath chest pain cough or any focal deficits. She may have lost consciousness for around 5 minutes. Patient's family was around her and she regained consciousness spontaneously. EMS was called and patient was brought to the ER. In the ER patient was found to be hypoglycemic with a CBG of 30. D50 one ampule was given and patient has been admitted for further observation as this is the second episode within the last 24 hours. Patient had come earlier with a near syncopal episode yesterday morning. By that time patient did not lose consciousness.  Past Medical History  Diagnosis Date  . Hypertension   . Deep venous thrombosis of leg   . Bacterial vaginosis 07-27-11  . Yeast infection   . Hx of bacterial infection   . Measles   . Hx of measles   . Migraines   . Hx of migraines   . History of chicken pox   . Hx: UTI (urinary tract infection)   . Vestibulitis of vagina 03/04/11  . Monilia infection 12/31/10  . Vaginal atrophy 03/04/11  . Dyspareunia 03/04/11  . Chronic pelvic pain in female 08/14/2003  . Depression   . Bipolar 1 disorder     Past Surgical History  Procedure Date  . Total knee revision     right knee  . Cesarean section 1998  . Tonsillectomy   . Wisdom tooth extraction   . Cholecystectomy   . Left elbow   . Abdominal hysterectomy 2000    Family History  Problem Relation Age of Onset  . Cancer Maternal Aunt    Social History:  reports that she has been smoking Cigarettes.  She has been smoking about .3 packs per day. She has never used  smokeless tobacco. She reports that she does not drink alcohol or use illicit drugs.  Allergies:  Allergies  Allergen Reactions  . Codeine     Nausea and vomitin  . Morphine And Related     Makes me loopy and hallucinates  . Naproxen     Rash and nausea  . Sulfonamide Derivatives     Nausea and rash     (Not in a hospital admission)  Results for orders placed during the hospital encounter of 04/17/12 (from the past 48 hour(s))  GLUCOSE, CAPILLARY     Status: Abnormal   Collection Time   04/17/12  9:04 PM      Component Value Range Comment   Glucose-Capillary 33 (*) 70 - 99 mg/dL    Comment 1 Notify RN     CBC     Status: Normal   Collection Time   04/17/12  9:20 PM      Component Value Range Comment   WBC 7.6  4.0 - 10.5 K/uL    RBC 4.66  3.87 - 5.11 MIL/uL    Hemoglobin 14.7  12.0 - 15.0 g/dL    HCT 16.1  09.6 - 04.5 %    MCV 91.0  78.0 - 100.0 fL    MCH 31.5  26.0 - 34.0 pg    MCHC 34.7  30.0 - 36.0 g/dL    RDW 54.0  98.1 - 19.1 %    Platelets 152  150 - 400 K/uL   COMPREHENSIVE METABOLIC PANEL     Status: Abnormal   Collection Time   04/17/12  9:20 PM      Component Value Range Comment   Sodium 140  135 - 145 mEq/L    Potassium 3.8  3.5 - 5.1 mEq/L    Chloride 103  96 - 112 mEq/L    CO2 29  19 - 32 mEq/L    Glucose, Bld 70  70 - 99 mg/dL    BUN 16  6 - 23 mg/dL    Creatinine, Ser 4.78 (*) 0.50 - 1.10 mg/dL    Calcium 9.3  8.4 - 29.5 mg/dL    Total Protein 6.5  6.0 - 8.3 g/dL    Albumin 3.5  3.5 - 5.2 g/dL    AST 15  0 - 37 U/L    ALT 8  0 - 35 U/L    Alkaline Phosphatase 45  39 - 117 U/L    Total Bilirubin 0.3  0.3 - 1.2 mg/dL    GFR calc non Af Amer 57 (*) >90 mL/min    GFR calc Af Amer 66 (*) >90 mL/min   POCT I-STAT TROPONIN I     Status: Normal   Collection Time   04/17/12  9:25 PM      Component Value Range Comment   Troponin i, poc 0.00  0.00 - 0.08 ng/mL    Comment 3            GLUCOSE, CAPILLARY     Status: Abnormal   Collection Time   04/17/12   9:45 PM      Component Value Range Comment   Glucose-Capillary 120 (*) 70 - 99 mg/dL   GLUCOSE, CAPILLARY     Status: Normal   Collection Time   04/18/12 12:38 AM      Component Value Range Comment   Glucose-Capillary 89  70 - 99 mg/dL    Comment 1 Documented in Chart      Comment 2 Notify RN      Ct Head Wo Contrast  04/17/2012  *RADIOLOGY REPORT*  Clinical Data:  Status post fall, with head injury.  Amnesia. Concern for cervical spine injury.  CT HEAD WITHOUT CONTRAST AND CT CERVICAL SPINE WITHOUT CONTRAST  Technique:  Multidetector CT imaging of the head and cervical spine was performed following the standard protocol without intravenous contrast.  Multiplanar CT image reconstructions of the cervical spine were also generated.  Comparison: CT of the head performed 10/26/2004, and cervical spine radiographs performed 03/16/2004  CT HEAD  Findings: There is no evidence of acute infarction, mass lesion, or intra- or extra-axial hemorrhage on CT.  Cerebellar atrophy is noted.  The brainstem and fourth ventricle are within normal limits.  The third and lateral ventricles, and basal ganglia are unremarkable in appearance.  The cerebral hemispheres are symmetric in appearance, with normal gray-white differentiation.  No mass effect or midline shift is seen.  There is no evidence of fracture; visualized osseous structures are unremarkable in appearance.  The orbits are within normal limits. The paranasal sinuses and mastoid air cells are well-aerated.  No significant soft tissue abnormalities are seen.  IMPRESSION: No evidence of traumatic intracranial injury or fracture.  CT CERVICAL SPINE  Findings: There is no evidence of fracture or subluxation. Vertebral bodies demonstrate normal height and alignment. Intervertebral disc  spaces are preserved.  Prevertebral soft tissues are within normal limits.  The visualized neural foramina are grossly unremarkable.  The thyroid gland is markedly diminutive and not well  characterized.  The minimally visualized lung apices are clear.  No significant soft tissue abnormalities are seen.  IMPRESSION: No evidence of fracture or subluxation along the cervical spine.  Original Report Authenticated By: Tonia Ghent, M.D.   Ct Cervical Spine Wo Contrast  04/17/2012  *RADIOLOGY REPORT*  Clinical Data:  Status post fall, with head injury.  Amnesia. Concern for cervical spine injury.  CT HEAD WITHOUT CONTRAST AND CT CERVICAL SPINE WITHOUT CONTRAST  Technique:  Multidetector CT imaging of the head and cervical spine was performed following the standard protocol without intravenous contrast.  Multiplanar CT image reconstructions of the cervical spine were also generated.  Comparison: CT of the head performed 10/26/2004, and cervical spine radiographs performed 03/16/2004  CT HEAD  Findings: There is no evidence of acute infarction, mass lesion, or intra- or extra-axial hemorrhage on CT.  Cerebellar atrophy is noted.  The brainstem and fourth ventricle are within normal limits.  The third and lateral ventricles, and basal ganglia are unremarkable in appearance.  The cerebral hemispheres are symmetric in appearance, with normal gray-white differentiation.  No mass effect or midline shift is seen.  There is no evidence of fracture; visualized osseous structures are unremarkable in appearance.  The orbits are within normal limits. The paranasal sinuses and mastoid air cells are well-aerated.  No significant soft tissue abnormalities are seen.  IMPRESSION: No evidence of traumatic intracranial injury or fracture.  CT CERVICAL SPINE  Findings: There is no evidence of fracture or subluxation. Vertebral bodies demonstrate normal height and alignment. Intervertebral disc spaces are preserved.  Prevertebral soft tissues are within normal limits.  The visualized neural foramina are grossly unremarkable.  The thyroid gland is markedly diminutive and not well characterized.  The minimally visualized lung  apices are clear.  No significant soft tissue abnormalities are seen.  IMPRESSION: No evidence of fracture or subluxation along the cervical spine.  Original Report Authenticated By: Tonia Ghent, M.D.    Review of Systems  Constitutional: Negative.   HENT: Negative.   Eyes: Negative.   Respiratory: Negative.   Cardiovascular: Negative.   Gastrointestinal: Negative.   Genitourinary: Negative.   Musculoskeletal: Negative.   Skin: Negative.   Neurological: Positive for loss of consciousness.  Endo/Heme/Allergies: Negative.   Psychiatric/Behavioral: Negative.     Blood pressure 123/86, pulse 69, temperature 98 F (36.7 C), temperature source Oral, resp. rate 16, SpO2 96.00%. Physical Exam  Constitutional: She is oriented to person, place, and time. She appears well-developed and well-nourished. No distress.  HENT:  Head: Normocephalic and atraumatic.  Right Ear: External ear normal.  Left Ear: External ear normal.  Mouth/Throat: No oropharyngeal exudate.  Eyes: Conjunctivae are normal. Pupils are equal, round, and reactive to light. Right eye exhibits no discharge. Left eye exhibits no discharge. No scleral icterus.  Neck: Normal range of motion. Neck supple.  Cardiovascular: Normal rate and regular rhythm.   Respiratory: Effort normal and breath sounds normal. No respiratory distress. She has no wheezes. She has no rales.  GI: Soft. Bowel sounds are normal. She exhibits no distension. There is no tenderness. There is no rebound.  Musculoskeletal: Normal range of motion. She exhibits no edema and no tenderness.  Neurological: She is alert and oriented to person, place, and time.       Moves all extremities 5/5. No facial  asymmetry.   Skin: Skin is warm and dry. No rash noted. She is not diaphoretic. No erythema.  Psychiatric: Her behavior is normal.     Assessment/Plan #1. Syncope - cause not clear. Telemetry shows sinus rhythm. Her EKG shows QRS of 112 ms and QTC of 407 ms.  There is nonspecific ST-T changes which are comparable to the old EKG. Patient also was found to be hypoglycemic. Patient states she did not eat well today. At this time we will monitor her. Check CBGs closely for any further hypoglycemic episode. Check insulin and C-peptide levels. Check drug screen. And also get a 2-D echo. Check Depakote level. #2. Hypothyroidism - check TSH. Continue Synthroid. #3. Hyperlipidemia - continue present medications. #4. History of bipolar disorder - continue present medications. Of note Abilify can cause hypoglycemia. #5. History of DVT - patient has had a history of DVT and was on Coumadin last year for a whole year and has been off since last December. This was a provoked DVT after she sustained left ankle fracture. We will check d-dimer.  Patient complains of some right-sided lower quadrant discomfort. Abdomen appears benign. Patient has had appendectomy, hysterectomy and cholecystectomy. We'll check urinalysis.  CODE STATUS - full code.    Carys Malina N. 04/18/2012, 2:19 AM

## 2012-04-18 NOTE — ED Notes (Signed)
Report called nurse unavailable will call me back

## 2012-04-18 NOTE — Progress Notes (Signed)
  Echocardiogram 2D Echocardiogram has been performed.  [name] 04/18/2012, 3:04 PM

## 2012-04-18 NOTE — ED Notes (Signed)
Pt ambulated to bathroom unassisted without difficulty and steady gait

## 2012-04-18 NOTE — Progress Notes (Signed)
Patient received as admission from the ER department.  Patient awake alert oriented x3.  Patient does not complain of any sob, dizziness, or pain at this time.  Blood sugar upon arrival to floor 86.  IV fluids infusing as ordered.  Call light within reach.  Patient instructed to call before getting up to the bathroom.  Patient verbalizes understanding.  Assessed for questions at this time.  No further questions.  Patient resting comfortably at this time.  Will continue to monitor.

## 2012-04-18 NOTE — ED Notes (Signed)
Patient states she fell and hit side of her head, patient is not complain ing of any pain at this. Waiting for telemetry bed on the floor . Will cont monitor

## 2012-04-18 NOTE — ED Provider Notes (Signed)
Medical screening examination/treatment/procedure(s) were performed by non-physician practitioner and as supervising physician I was immediately available for consultation/collaboration.  Juliet Rude. Rubin Payor, MD 04/18/12 1459

## 2012-04-18 NOTE — Progress Notes (Signed)
The patient was seen and the database was reviewed. 41 year old female admitted with syncopal event thought to be secondary to hypoglycemia of uncertain etiology. Multiple laboratory tests as well as a two-dimensional echocardiogram have been ordered with results pending at this time. We'll continue to monitor. We'll add iron studies given the patient's complaints of restless legs. Will followup in the morning.  [name] 04/18/2012 7:07 PM

## 2012-04-19 DIAGNOSIS — E162 Hypoglycemia, unspecified: Secondary | ICD-10-CM

## 2012-04-19 DIAGNOSIS — Z8659 Personal history of other mental and behavioral disorders: Secondary | ICD-10-CM

## 2012-04-19 DIAGNOSIS — R55 Syncope and collapse: Secondary | ICD-10-CM

## 2012-04-19 DIAGNOSIS — R51 Headache: Secondary | ICD-10-CM | POA: Diagnosis present

## 2012-04-19 DIAGNOSIS — R519 Headache, unspecified: Secondary | ICD-10-CM | POA: Diagnosis present

## 2012-04-19 DIAGNOSIS — E032 Hypothyroidism due to medicaments and other exogenous substances: Secondary | ICD-10-CM

## 2012-04-19 LAB — CBC
MCH: 31 pg (ref 26.0–34.0)
MCHC: 33.4 g/dL (ref 30.0–36.0)
MCV: 92.6 fL (ref 78.0–100.0)
Platelets: 147 10*3/uL — ABNORMAL LOW (ref 150–400)
RBC: 4.2 MIL/uL (ref 3.87–5.11)

## 2012-04-19 LAB — COMPREHENSIVE METABOLIC PANEL
ALT: 8 U/L (ref 0–35)
AST: 11 U/L (ref 0–37)
Albumin: 2.8 g/dL — ABNORMAL LOW (ref 3.5–5.2)
Alkaline Phosphatase: 40 U/L (ref 39–117)
Chloride: 106 mEq/L (ref 96–112)
Potassium: 4.4 mEq/L (ref 3.5–5.1)
Sodium: 140 mEq/L (ref 135–145)
Total Bilirubin: 0.2 mg/dL — ABNORMAL LOW (ref 0.3–1.2)

## 2012-04-19 LAB — GLUCOSE, CAPILLARY
Glucose-Capillary: 71 mg/dL (ref 70–99)
Glucose-Capillary: 75 mg/dL (ref 70–99)

## 2012-04-19 LAB — T4, FREE: Free T4: 1.33 ng/dL (ref 0.80–1.80)

## 2012-04-19 LAB — INSULIN, RANDOM: Insulin: 37 u[IU]/mL — ABNORMAL HIGH (ref 3–28)

## 2012-04-19 LAB — T3 UPTAKE: T3 Uptake Ratio: 33.6 % (ref 22.5–37.0)

## 2012-04-19 MED ORDER — COSYNTROPIN 0.25 MG IJ SOLR
0.2500 mg | Freq: Once | INTRAMUSCULAR | Status: AC
  Administered 2012-04-20: 0.25 mg via INTRAVENOUS
  Filled 2012-04-19 (×2): qty 0.25

## 2012-04-19 MED ORDER — HYDROCODONE-ACETAMINOPHEN 5-325 MG PO TABS
1.0000 | ORAL_TABLET | ORAL | Status: DC | PRN
  Administered 2012-04-19 – 2012-04-21 (×8): 2 via ORAL
  Filled 2012-04-19 (×8): qty 2

## 2012-04-19 NOTE — Progress Notes (Signed)
PROGRESS NOTE  [name] ZOX:[id number] DOB: 06-16-1971 DOA: 04/17/2012 PCP: Georganna Skeans, MD  Brief narrative: Ms. Rada is a 41 year old female with a PMH of hypothyroidism, bipolar disorder, HTN who was admitted on 04/18/12 with a syncopal episode.  EMS found her to be hypoglycemic (no history of diabetes, not on any medications that would lower glucose).  Further diagnostic testing reveals elevated insulin levels, normal C peptide levels, and a markedly elevated TSH despite being on thyroid hormone replacement.  Spoke with Dr. Talmage Coin (endocrinology) who recommends further diagnostic testing with a sulfonylurea screen and a cosyntropin test to rule out adrenal insufficiency.  Interim History: Stable overnight.  RN reports relatively low post-prandial blood glucoses.   Assessment/Plan: Principal Problem:  *Syncope thought to be secondary to hypoglycemia  Not on any known oral hypoglycemics, no family with DM so accidental ingestion unlikely.  Will ask pharmacy to examine her prescription bottles/pills to ensure a mistake wasn't made in filling her medicines.  TSH elevation not likely to cause this.  C-peptide levels are within normal limits, so exogenous insulin unlikely.  Elevated insulin levels may be seen with insulinoma (rare) or accidental oral hypoglycemic ingestion (more common), so will run a sulfonylurea screen (May not catch it if the medicine has cleared her system).  Check cosyntropin test to rule out adrenal insufficiency. Active Problems:  Uncontrolled hypothyroidism  Will increase synthroid to 125 mcg daily.  Check free T4, T3 uptake.  DISORDER, BIPOLAR NOS  Continue usual medications.  Headache  Unclear etiology, known history of migraines.  CT head and C-spine done on admission, no acute findings.  Symptomatic treatment with Vicodin.   Code Status: Full Family Communication: None at bedside. Disposition Plan: Home when stable.  Medical  Consultants:  Telephone consultation made with Dr. Talmage Coin, Endocrinology  Other consultants:  None  Antibiotics:  None   Subjective  Ms. Gimbel reports ongoing problems with pre-syncope and headache.  No nausea or vomiting.   Objective   Objective: Filed Vitals:   04/18/12 1606 04/18/12 2210 04/19/12 0519 04/19/12 1411  BP: 112/62 117/84 113/76 121/85  Pulse: 53 58 68 52  Temp: 97.4 F (36.3 C) 97.9 F (36.6 C) 97.7 F (36.5 C) 98.5 F (36.9 C)  TempSrc: Oral Oral Oral Oral  Resp: 18 20 18 20   Height: 5\' 3"  (1.6 m)     Weight: 86.183 kg (190 lb)     SpO2: 96% 95% 95% 93%    Intake/Output Summary (Last 24 hours) at 04/19/12 1449 Last data filed at 04/19/12 1300  Gross per 24 hour  Intake 2204.5 ml  Output      0 ml  Net 2204.5 ml    Exam: Gen:  NAD Cardiovascular:  RRR, No M/R/G Respiratory: Lungs CTAB Gastrointestinal: Abdomen soft, NT/ND with normal active bowel sounds. Extremities: No C/E/C  Data Reviewed: Basic Metabolic Panel:  Lab 04/19/12 8119 04/17/12 2120  NA 140 140  K 4.4 3.8  CL 106 103  CO2 26 29  GLUCOSE 108* 70  BUN 10 16  CREATININE 0.97 1.17*  CALCIUM 8.8 9.3  MG -- --  PHOS -- --   GFR Estimated Creatinine Clearance: 78.6 ml/min (by C-G formula based on Cr of 0.97). Liver Function Tests:  Lab 04/19/12 0444 04/17/12 2120  AST 11 15  ALT 8 8  ALKPHOS 40 45  BILITOT 0.2* 0.3  PROT 5.4* 6.5  ALBUMIN 2.8* 3.5    CBC:  Lab 04/19/12 0444 04/17/12 2120  WBC 7.7 7.6  NEUTROABS -- --  HGB 13.0 14.7  HCT 38.9 42.4  MCV 92.6 91.0  PLT 147* 152   Cardiac Enzymes:  Lab 04/18/12 0241  CKTOTAL 46  CKMB 0.9  CKMBINDEX --  TROPONINI <0.30   CBG:  Lab 04/19/12 1432 04/19/12 1203 04/19/12 0807 04/19/12 0737 04/19/12 0315  GLUCAP 83 89 82 75 71   D-Dimer  Basename 04/18/12 0805  DDIMER <0.22   Thyroid function studies  Basename 04/19/12 0444  TSH 16.611*  T4TOTAL --  T3FREE --  THYROIDAB --    Insulin:  Ref. Range 04/18/2012 02:41 04/18/2012 05:45  Insulin Latest Range: 3-28 uIU/mL 37 (H)   C-Peptide Latest Range: 0.80-3.90 ng/mL  3.82   Anemia work up  [employer] 04/19/12 0444  VITAMINB12 --  FOLATE --  FERRITIN 24  TIBC --  IRON --  RETICCTPCT --   Microbiology No results found for this or any previous visit (from the past 240 hour(s)).  Procedures and Diagnostic Studies:  Ct Head Wo Contrast 04/17/2012   IMPRESSION: No evidence of traumatic intracranial injury or fracture.  Original Report Authenticated By: Tonia Ghent, M.D.    Ct Cervical Spine Wo Contrast 04/17/2012 IMPRESSION: No evidence of fracture or subluxation along the cervical spine.  Original Report Authenticated By: Tonia Ghent, M.D.    Scheduled Meds:    . ARIPiprazole  7.5 mg Oral Daily  . buPROPion  150 mg Oral Daily  . cosyntropin  0.25 mg Intravenous Once  . divalproex  1,000 mg Oral QHS  . gabapentin  300 mg Oral Daily  . HYDROcodone-acetaminophen  1 tablet Oral Once  . levothyroxine  100 mcg Oral Daily  . omega-3 acid ethyl esters  2 g Oral BID  . simvastatin  40 mg Oral QPM  . sodium chloride  3 mL Intravenous Q12H  . traZODone  200 mg Oral QHS   Continuous Infusions:    . sodium chloride 75 mL/hr at 04/18/12 2007      LOS: 2 days   Hillery Aldo, MD Pager [phone]  04/19/2012, 2:49 PM

## 2012-04-19 NOTE — Progress Notes (Signed)
Patient's medications sent to pharmacy as per Dr. Darnelle Catalan.  As per pharmacy all medications in bottles match the label on the bottles.

## 2012-04-19 NOTE — Progress Notes (Signed)
Upon walking back from bathroom with nursing tech, patient became very dizzy and kneeled to the floor.  Patient did not fall or injure herself.  Patient assisted back to bed by nurse and nursing tech.  Vitals signs at this time, B/P 121/85 hr 52, resp 20, and 93% on room air.  Dr. Darnelle Catalan made aware and went in to see patient.  Will continue to monitor.

## 2012-04-20 DIAGNOSIS — R55 Syncope and collapse: Secondary | ICD-10-CM

## 2012-04-20 DIAGNOSIS — E032 Hypothyroidism due to medicaments and other exogenous substances: Secondary | ICD-10-CM

## 2012-04-20 DIAGNOSIS — Z8659 Personal history of other mental and behavioral disorders: Secondary | ICD-10-CM

## 2012-04-20 DIAGNOSIS — E162 Hypoglycemia, unspecified: Secondary | ICD-10-CM

## 2012-04-20 LAB — GLUCOSE, CAPILLARY
Glucose-Capillary: 113 mg/dL — ABNORMAL HIGH (ref 70–99)
Glucose-Capillary: 92 mg/dL (ref 70–99)
Glucose-Capillary: 98 mg/dL (ref 70–99)
Glucose-Capillary: 99 mg/dL (ref 70–99)

## 2012-04-20 LAB — ACTH STIMULATION, 3 TIME POINTS: Cortisol, Base: 1.8 ug/dL

## 2012-04-20 LAB — HEMOGLOBIN A1C
Hgb A1c MFr Bld: 4.8 % (ref <5.7)
Mean Plasma Glucose: 91 mg/dL (ref <117)

## 2012-04-20 NOTE — Progress Notes (Signed)
Lying 981, 63, 18 O2 sat = 94% BP = 115/78 Sitting 109/76 - 59 - 94% Standing 104/79 -75 93% patient states she feels a bit woozy but wanted to try to walk a bit in the hallway After walking in the hallway with assistance complained of headache and feeling nauseated.  Medicated.  CBG = 106.  Patient now eating crackers and juice feeling some better.  She also expresses the desire to have consult with a nutritionist regarding a proper diet for heart/blood sugar health

## 2012-04-20 NOTE — Progress Notes (Signed)
PROGRESS NOTE  RESHONDA KOERBER ZOX:[id number] DOB: 1971-03-11 DOA: 04/17/2012 PCP: Georganna Skeans, MD  Brief narrative: Ms. Mokry is a 41 year old female with a PMH of hypothyroidism, bipolar disorder, HTN who was admitted on 04/18/12 with a syncopal episode.  EMS found her to be hypoglycemic (no history of diabetes, not on any medications that would lower glucose).  Further diagnostic testing reveals elevated insulin levels, normal C peptide levels, and a markedly elevated TSH despite being on thyroid hormone replacement.  Spoke with Dr. Talmage Coin (endocrinology) who recommends further diagnostic testing with a sulfonylurea screen and a cosyntropin test to rule out adrenal insufficiency.  Interim History: Stable overnight.  RN reports relatively low post-prandial blood glucoses.   Assessment/Plan: Principal Problem:  *Syncope thought to be secondary to hypoglycemia  Not on any known oral hypoglycemics, no family with DM so accidental ingestion unlikely.  Pharmacy has examined her prescription bottles/pills to ensure a mistake wasn't made in filling her medicines.  All pills match their prescription labels.    TSH elevation not likely to cause this.  C-peptide levels are within normal limits, so exogenous insulin unlikely.  Hemoglobin A1c was 4.8  Elevated insulin levels may be seen with insulinoma (rare) or accidental oral hypoglycemic ingestion (more common), so will run a sulfonylurea screen (May not catch it if the medicine has cleared her system).  Cosyntropin test was mildly abnormal at the 30 min but completely normal at 60 min, so no evidence of adrenal insufficiency. Active Problems:  Uncontrolled hypothyroidism  We increased synthroid to 125 mcg daily.  Free T4, T3 uptake were WNL.  DISORDER, BIPOLAR NOS  Continue usual medications.  Headache  Unclear etiology, known history of migraines.  CT head and C-spine done on admission, no acute findings.  Symptomatic  treatment with Vicodin.   Code Status: Full Family Communication: None at bedside. Disposition Plan: Home when stable.  Medical Consultants:  Telephone consultation made with Dr. Talmage Coin, Endocrinology who she will follow up with post-discharge.  Other consultants:  None  Antibiotics:  None   Subjective  Ms. Fetterly reports ongoing problems with pre-syncope and dizziness, but headache improved with Vicodin.   Objective   Objective: Filed Vitals:   04/19/12 1411 04/19/12 2134 04/20/12 0553 04/20/12 1453  BP: 121/85 118/80 104/70 111/73  Pulse: 52 64 66 68  Temp: 98.5 F (36.9 C) 98.4 F (36.9 C) 98.2 F (36.8 C) 98.4 F (36.9 C)  TempSrc: Oral Oral Oral Oral  Resp: 20 18 18 18   Height:      Weight:      SpO2: 93% 100% 100% 98%    Intake/Output Summary (Last 24 hours) at 04/20/12 1655 Last data filed at 04/20/12 1443  Gross per 24 hour  Intake   2945 ml  Output   3450 ml  Net   -505 ml    Exam: Gen:  NAD Cardiovascular:  RRR, No M/R/G Respiratory: Lungs CTAB Gastrointestinal: Abdomen soft, NT/ND with normal active bowel sounds. Extremities: No C/E/C  Data Reviewed: Basic Metabolic Panel:  Lab 04/19/12 8119 04/17/12 2120  NA 140 140  K 4.4 3.8  CL 106 103  CO2 26 29  GLUCOSE 108* 70  BUN 10 16  CREATININE 0.97 1.17*  CALCIUM 8.8 9.3  MG -- --  PHOS -- --   GFR Estimated Creatinine Clearance: 78.6 ml/min (by C-G formula based on Cr of 0.97). Liver Function Tests:  Lab 04/19/12 0444 04/17/12 2120  AST 11 15  ALT 8  8  ALKPHOS 40 45  BILITOT 0.2* 0.3  PROT 5.4* 6.5  ALBUMIN 2.8* 3.5    CBC:  Lab 04/19/12 0444 04/17/12 2120  WBC 7.7 7.6  NEUTROABS -- --  HGB 13.0 14.7  HCT 38.9 42.4  MCV 92.6 91.0  PLT 147* 152   Cardiac Enzymes:  Lab 04/18/12 0241  CKTOTAL 46  CKMB 0.9  CKMBINDEX --  TROPONINI <0.30   CBG:  Lab 04/20/12 1208 04/20/12 0859 04/20/12 0414 04/20/12 0003 04/19/12 2037  GLUCAP 99 113* 92 98 85    D-Dimer  Basename 04/18/12 0805  DDIMER <0.22   Thyroid function studies  Basename 04/19/12 0444  TSH 16.611*  T4TOTAL --  T3FREE --  THYROIDAB --   Insulin:  Ref. Range 04/18/2012 02:41 04/18/2012 05:45  Insulin Latest Range: 3-28 uIU/mL 37 (H)   C-Peptide Latest Range: 0.80-3.90 ng/mL  3.82   Anemia work up  [employer] 04/19/12 0444  VITAMINB12 --  FOLATE --  FERRITIN 24  TIBC --  IRON --  RETICCTPCT --   Microbiology No results found for this or any previous visit (from the past 240 hour(s)).  Procedures and Diagnostic Studies:  Ct Head Wo Contrast 04/17/2012   IMPRESSION: No evidence of traumatic intracranial injury or fracture.  Original Report Authenticated By: Tonia Ghent, M.D.    Ct Cervical Spine Wo Contrast 04/17/2012 IMPRESSION: No evidence of fracture or subluxation along the cervical spine.  Original Report Authenticated By: Tonia Ghent, M.D.    Scheduled Meds:    . ARIPiprazole  7.5 mg Oral Daily  . buPROPion  150 mg Oral Daily  . cosyntropin  0.25 mg Intravenous Once  . divalproex  1,000 mg Oral QHS  . gabapentin  300 mg Oral Daily  . levothyroxine  100 mcg Oral Daily  . omega-3 acid ethyl esters  2 g Oral BID  . simvastatin  40 mg Oral QPM  . sodium chloride  3 mL Intravenous Q12H  . traZODone  200 mg Oral QHS   Continuous Infusions:    . sodium chloride 75 mL/hr at 04/19/12 2138      LOS: 3 days   Hillery Aldo, MD Pager [phone]  04/20/2012, 4:55 PM

## 2012-04-21 DIAGNOSIS — E162 Hypoglycemia, unspecified: Secondary | ICD-10-CM

## 2012-04-21 DIAGNOSIS — E032 Hypothyroidism due to medicaments and other exogenous substances: Secondary | ICD-10-CM

## 2012-04-21 DIAGNOSIS — Z8659 Personal history of other mental and behavioral disorders: Secondary | ICD-10-CM

## 2012-04-21 DIAGNOSIS — R55 Syncope and collapse: Secondary | ICD-10-CM

## 2012-04-21 LAB — GLUCOSE, CAPILLARY
Glucose-Capillary: 77 mg/dL (ref 70–99)
Glucose-Capillary: 82 mg/dL (ref 70–99)

## 2012-04-21 MED ORDER — LEVOTHYROXINE SODIUM 125 MCG PO TABS: 125.0000 ug | ORAL_TABLET | Freq: Every day | ORAL | Status: DC

## 2012-04-21 NOTE — ED Provider Notes (Signed)
Medical screening examination/treatment/procedure(s) were performed by non-physician practitioner and as supervising physician I was immediately available for consultation/collaboration.  Robertt Buda R. Michaella Imai, MD 04/21/12 0741 

## 2012-04-21 NOTE — Discharge Instructions (Signed)

## 2012-04-21 NOTE — Discharge Summary (Signed)
Physician Discharge Summary  Patient ID: [name] MRN: [id number] DOB/AGE: [birth year] 43 y.o.  Admit date: 04/17/2012 Discharge date: 04/21/2012  Primary Care Physician:  Georganna Skeans, MD   Discharge Diagnoses:    Present on Admission:  .Syncope thought to be secondary to hypoglycemia .Hypoglycemia .Uncontrolled hypothyroidism .DISORDER, BIPOLAR NOS .Headache  Discharge Medications:  Medication List  As of 04/21/2012  2:09 PM   TAKE these medications         ARIPiprazole 5 MG tablet   Commonly known as: ABILIFY   Take 7.5 mg by mouth daily.      buPROPion 150 MG 24 hr tablet   Commonly known as: WELLBUTRIN XL   Take 150 mg by mouth daily.      clonazePAM 0.5 MG tablet   Commonly known as: KLONOPIN   Take 0.5 mg by mouth 3 (three) times daily as needed. For anxiety      divalproex 500 MG 24 hr tablet   Commonly known as: DEPAKOTE ER   Take 1,000 mg by mouth at bedtime.      gabapentin 100 MG capsule   Commonly known as: NEURONTIN   Take 300 mg by mouth daily.      levothyroxine 125 MCG tablet   Commonly known as: SYNTHROID, LEVOTHROID   Take 1 tablet (125 mcg total) by mouth daily.      omega-3 acid ethyl esters 1 G capsule   Commonly known as: LOVAZA   Take 2 g by mouth 2 (two) times daily.      propranolol 10 MG tablet   Commonly known as: INDERAL   Take 10 mg by mouth 2 (two) times daily.      simvastatin 40 MG tablet   Commonly known as: ZOCOR   Take 40 mg by mouth every evening.      traMADol 50 MG tablet   Commonly known as: ULTRAM   Take 50 mg by mouth every 6 (six) hours as needed. For pain      traZODone 100 MG tablet   Commonly known as: DESYREL   Take 200 mg by mouth at bedtime.             Disposition and Follow-up: The patient is being discharged home.  She is instructed to follow up with her PCP in 1 week.  She was also advised to follow up with Dr. Sharl Ma (endocrinology) next week.  She was given the contact information to  [facility], to see if she might qualify for a day program to help manage her social anxiety.  Medical Consultants:  Telephone consultation made with Dr. Talmage Coin, Endocrinology who she will follow up with post-discharge. Other consultants:  None  Procedures and Diagnostic Studies:   Ct Head Wo Contrast 04/17/2012 IMPRESSION: No evidence of traumatic intracranial injury or fracture. Original Report Authenticated By: Tonia Ghent, M.D.   Ct Cervical Spine Wo Contrast 04/17/2012 IMPRESSION: No evidence of fracture or subluxation along the cervical spine. Original Report Authenticated By: Tonia Ghent, M.D.    Discharge Laboratory Values: Basic Metabolic Panel:  Lab 04/19/12 4098 04/17/12 2120  NA 140 140  K 4.4 3.8  CL 106 103  CO2 26 29  GLUCOSE 108* 70  BUN 10 16  CREATININE 0.97 1.17*  CALCIUM 8.8 9.3  MG -- --  PHOS -- --   GFR Estimated Creatinine Clearance: 78.6 ml/min (by C-G formula based on Cr of 0.97). Liver Function Tests:  Lab 04/19/12 0444 04/17/12 2120  AST 11 15  ALT 8 8  ALKPHOS 40 45  BILITOT 0.2* 0.3  PROT 5.4* 6.5  ALBUMIN 2.8* 3.5   CBC:  Lab 04/19/12 0444 04/17/12 2120  WBC 7.7 7.6  NEUTROABS -- --  HGB 13.0 14.7  HCT 38.9 42.4  MCV 92.6 91.0  PLT 147* 152   Cardiac Enzymes:  Lab 04/18/12 0241  CKTOTAL 46  CKMB 0.9  CKMBINDEX --  TROPONINI <0.30   CBG:  Lab 04/21/12 1138 04/21/12 0410 04/21/12 0016 04/20/12 2000 04/20/12 1750  GLUCAP 82 77 88 103* 105*   Hgb A1c  Basename 04/19/12 1613  HGBA1C 4.8   Thyroid function studies  Basename 04/19/12 0444  TSH 16.611*  T4TOTAL --  T3FREE --  THYROIDAB --   Anemia work up  [employer] 04/19/12 0444  VITAMINB12 --  FOLATE --  FERRITIN 24  TIBC --  IRON --  RETICCTPCT --   Microbiology No results found for this or any previous visit (from the past 240 hour(s)).   Brief H and P: For complete details please refer to admission H and P, but in brief, [name] is a  41 year old female with a PMH of hypothyroidism, bipolar disorder, HTN who was admitted on 04/18/12 with a syncopal episode. EMS found her to be hypoglycemic (no history of diabetes, not on any medications that would lower glucose).    Physical Exam at Discharge: BP 98/66  Pulse 70  Temp 97.6 F (36.4 C) (Oral)  Resp 16  Ht 5\' 3"  (1.6 m)  Wt 86.183 kg (190 lb)  BMI 33.66 kg/m2  SpO2 94% Gen:  NAD Cardiovascular:  RRR, No M/R/G Respiratory: Lungs CTAB Gastrointestinal: Abdomen soft, NT/ND with normal active bowel sounds. Extremities: No C/E/C  Hospital Course:  Principal Problem:  *Syncope thought to be secondary to hypoglycemia   Not on any known oral hypoglycemics, no family with DM so accidental ingestion unlikely.   Pharmacy has examined her prescription bottles/pills to ensure a mistake wasn't made in filling her medicines. All pills match their prescription labels.   TSH elevation not likely to cause this.   C-peptide levels are within normal limits, so exogenous insulin unlikely. Hemoglobin A1c was 4.8   Elevated insulin levels may be seen with insulinoma (rare) or accidental oral hypoglycemic ingestion (more common), so will run a sulfonylurea screen (May not catch it if the medicine has cleared her system).   Cosyntropin test was mildly abnormal at the 30 min but completely normal at 60 min, so no evidence of adrenal insufficiency. Active Problems:  Uncontrolled hypothyroidism   We increased synthroid to 125 mcg daily.   Free T4, T3 uptake were WNL. DISORDER, BIPOLAR NOS   Continue usual medications.  Clearly has some uncontrolled anxiety issues (mother reports she calls her up to 10 times a day, and that this is causing strain in her marriage).    Gave patient the number to [facility] to call and see if she would qualify for a day program that might help her with her social isolation. Headache   Unclear etiology, known history of migraines.   CT head  and C-spine done on admission, no acute findings.   Symptomatic treatment with Vicodin given while in the hospital.   Recommendations for hospital follow-up: 1.  F/U results of sulfonylurea screen.  Diet:  Small, frequent meals with snacks in between meals high in protein.  Activity:  Increase activity slowly, walk with assistance.  Condition at Discharge:   Stable.  Time spent  on Discharge:  40 minutes.  Signed: Dr. Trula Ore Rossetta Kama Pager [phone] 04/21/2012, 2:09 PM

## 2012-04-23 LAB — GLUCOSE, CAPILLARY

## 2012-04-27 LAB — SULFONYLUREA HYPOGLYCEMICS PANEL, URINE

## 2012-04-30 ENCOUNTER — Emergency Department (HOSPITAL_COMMUNITY): Payer: Medicaid Other

## 2012-04-30 ENCOUNTER — Emergency Department (HOSPITAL_COMMUNITY)
Admission: EM | Admit: 2012-04-30 | Discharge: 2012-05-01 | Disposition: A | Discharge status: Home / Self Care | Payer: Medicaid Other | Attending: Emergency Medicine | Admitting: Emergency Medicine

## 2012-04-30 ENCOUNTER — Encounter (HOSPITAL_COMMUNITY): Payer: Self-pay | Admitting: *Deleted

## 2012-04-30 DIAGNOSIS — Z79899 Other long term (current) drug therapy: Secondary | ICD-10-CM | POA: Insufficient documentation

## 2012-04-30 DIAGNOSIS — F3130 Bipolar disorder, current episode depressed, mild or moderate severity, unspecified: Secondary | ICD-10-CM | POA: Insufficient documentation

## 2012-04-30 DIAGNOSIS — N390 Urinary tract infection, site not specified: Secondary | ICD-10-CM | POA: Insufficient documentation

## 2012-04-30 DIAGNOSIS — Z9181 History of falling: Secondary | ICD-10-CM | POA: Insufficient documentation

## 2012-04-30 DIAGNOSIS — R413 Other amnesia: Secondary | ICD-10-CM | POA: Insufficient documentation

## 2012-04-30 DIAGNOSIS — R5383 Other fatigue: Secondary | ICD-10-CM | POA: Insufficient documentation

## 2012-04-30 DIAGNOSIS — R531 Weakness: Secondary | ICD-10-CM

## 2012-04-30 DIAGNOSIS — M2550 Pain in unspecified joint: Secondary | ICD-10-CM | POA: Insufficient documentation

## 2012-04-30 DIAGNOSIS — R5381 Other malaise: Secondary | ICD-10-CM | POA: Insufficient documentation

## 2012-04-30 DIAGNOSIS — Z86718 Personal history of other venous thrombosis and embolism: Secondary | ICD-10-CM | POA: Insufficient documentation

## 2012-04-30 DIAGNOSIS — G8929 Other chronic pain: Secondary | ICD-10-CM | POA: Insufficient documentation

## 2012-04-30 DIAGNOSIS — I10 Essential (primary) hypertension: Secondary | ICD-10-CM | POA: Insufficient documentation

## 2012-04-30 DIAGNOSIS — M545 Low back pain, unspecified: Secondary | ICD-10-CM | POA: Insufficient documentation

## 2012-04-30 DIAGNOSIS — R42 Dizziness and giddiness: Secondary | ICD-10-CM | POA: Insufficient documentation

## 2012-04-30 DIAGNOSIS — E669 Obesity, unspecified: Secondary | ICD-10-CM | POA: Insufficient documentation

## 2012-04-30 NOTE — ED Notes (Signed)
Pt in by ems. F[place] today at shopping center, c/o "legs giving out." Sts legs have been giving out 2-3 weeks, seen here last week for same.

## 2012-04-30 NOTE — ED Notes (Signed)
Pt reports that she has been having frequent falls, states that she had 4 falls today-- had the last one when she was at the mall with her daughter--pt states that she got dizzy and fell.

## 2012-05-01 LAB — CBC WITH DIFFERENTIAL/PLATELET
Basophils Absolute: 0 10*3/uL (ref 0.0–0.1)
Eosinophils Relative: 1 % (ref 0–5)
HCT: 40.5 % (ref 36.0–46.0)
Lymphocytes Relative: 46 % (ref 12–46)
Lymphs Abs: 4 10*3/uL (ref 0.7–4.0)
MCV: 90.8 fL (ref 78.0–100.0)
Monocytes Absolute: 0.9 10*3/uL (ref 0.1–1.0)
Neutro Abs: 3.8 10*3/uL (ref 1.7–7.7)
RBC: 4.46 MIL/uL (ref 3.87–5.11)
RDW: 12.6 % (ref 11.5–15.5)
WBC: 8.7 10*3/uL (ref 4.0–10.5)

## 2012-05-01 LAB — URINALYSIS, ROUTINE W REFLEX MICROSCOPIC
Glucose, UA: NEGATIVE mg/dL
Hgb urine dipstick: NEGATIVE
pH: 6.5 (ref 5.0–8.0)

## 2012-05-01 LAB — BASIC METABOLIC PANEL
CO2: 26 mEq/L (ref 19–32)
Calcium: 9.5 mg/dL (ref 8.4–10.5)
Chloride: 102 mEq/L (ref 96–112)
Creatinine, Ser: 1.05 mg/dL (ref 0.50–1.10)
Glucose, Bld: 77 mg/dL (ref 70–99)
Sodium: 137 mEq/L (ref 135–145)

## 2012-05-01 LAB — URINE MICROSCOPIC-ADD ON

## 2012-05-01 LAB — POCT PREGNANCY, URINE: Preg Test, Ur: NEGATIVE

## 2012-05-01 MED ORDER — SODIUM CHLORIDE 0.9 % IV SOLN
Freq: Once | INTRAVENOUS | Status: AC
  Administered 2012-05-01: via INTRAVENOUS

## 2012-05-01 MED ORDER — NITROFURANTOIN MONOHYD MACRO 100 MG PO CAPS: 100.0000 mg | ORAL_CAPSULE | Freq: Two times a day (BID) | ORAL | Status: AC

## 2012-05-01 NOTE — ED Provider Notes (Signed)
Medical screening examination/treatment/procedure(s) were conducted as a shared visit with non-physician practitioner(s) and myself.  I personally evaluated the patient during the encounter   Celene Kras, MD 05/01/12 [phone]

## 2012-05-01 NOTE — Discharge Instructions (Signed)
Fatigue Fatigue is a feeling of tiredness, lack of energy, lack of motivation, or feeling tired all the time. Having enough rest, good nutrition, and reducing stress will normally reduce fatigue. Consult your caregiver if it persists. The nature of your fatigue will help your caregiver to find out its cause. The treatment is based on the cause.  CAUSES  There are many causes for fatigue. Most of the time, fatigue can be traced to one or more of your habits or routines. Most causes fit into one or more of three general areas. They are: Lifestyle problems  Sleep disturbances.   Overwork.   Physical exertion.   Unhealthy habits.   Poor eating habits or eating disorders.   Alcohol and/or drug use .   Lack of proper nutrition (malnutrition).  Psychological problems  Stress and/or anxiety problems.   Depression.   Grief.   Boredom.  Medical Problems or Conditions  Anemia.   Pregnancy.   Thyroid gland problems.   Recovery from major surgery.   Continuous pain.   Emphysema or asthma that is not well controlled   Allergic conditions.   Diabetes.   Infections (such as mononucleosis).   Obesity.   Sleep disorders, such as sleep apnea.   Heart failure or other heart-related problems.   Cancer.   Kidney disease.   Liver disease.   Effects of certain medicines such as antihistamines, cough and cold remedies, prescription pain medicines, heart and blood pressure medicines, drugs used for treatment of cancer, and some antidepressants.  SYMPTOMS  The symptoms of fatigue include:   Lack of energy.   Lack of drive (motivation).   Drowsiness.   Feeling of indifference to the surroundings.  DIAGNOSIS  The details of how you feel help guide your caregiver in finding out what is causing the fatigue. You will be asked about your present and past health condition. It is important to review all medicines that you take, including prescription and non-prescription items. A  thorough exam will be done. You will be questioned about your feelings, habits, and normal lifestyle. Your caregiver may suggest blood tests, urine tests, or other tests to look for common medical causes of fatigue.  TREATMENT  Fatigue is treated by correcting the underlying cause. For example, if you have continuous pain or depression, treating these causes will improve how you feel. Similarly, adjusting the dose of certain medicines will help in reducing fatigue.  HOME CARE INSTRUCTIONS   Try to get the required amount of good sleep every night.   Eat a healthy and nutritious diet, and drink enough water throughout the day.   Practice ways of relaxing (including yoga or meditation).   Exercise regularly.   Make plans to change situations that cause stress. Act on those plans so that stresses decrease over time. Keep your work and personal routine reasonable.   Avoid street drugs and minimize use of alcohol.   Start taking a daily multivitamin after consulting your caregiver.  SEEK MEDICAL CARE IF:   You have persistent tiredness, which cannot be accounted for.   You have fever.   You have unintentional weight loss.   You have headaches.   You have disturbed sleep throughout the night.   You are feeling sad.   You have constipation.   You have dry skin.   You have gained weight.   You are taking any new or different medicines that you suspect are causing fatigue.   You are unable to sleep at night.     You develop any unusual swelling of your legs or other parts of your body.  SEEK IMMEDIATE MEDICAL CARE IF:   You are feeling confused.   Your vision is blurred.   You feel faint or pass out.   You develop severe headache.   You develop severe abdominal, pelvic, or back pain.   You develop chest pain, shortness of breath, or an irregular or fast heartbeat.   You are unable to pass a normal amount of urine.   You develop abnormal bleeding such as bleeding from  the rectum or you vomit blood.   You have thoughts about harming yourself or committing suicide.   You are worried that you might harm someone else.  MAKE SURE YOU:   Understand these instructions.   Will watch your condition.   Will get help right away if you are not doing well or get worse.  Document Released: 08/14/2007 Document Revised: 10/06/2011 Document Reviewed: 08/14/2007 [facility] Patient Information 2012 [place], [place].Near-Syncope Near-syncope is sudden weakness, dizziness, or feeling like you might pass out (faint). This may occur when getting up after sitting or while standing for a long period of time. Near-syncope can be caused by a drop in blood pressure. This is a common reaction, but it may occur to a greater degree in people taking medicines to control their blood pressure. Fainting often occurs when the blood pressure or pulse is too low to provide enough blood flow to the brain to keep you conscious. Fainting and near-syncope are not usually due to serious medical problems. However, certain people should be more cautious in the event of near-syncope, including elderly patients, patients with diabetes, and patients with a history of heart conditions (especially irregular rhythms).  CAUSES   Drop in blood pressure.   Physical pain.   Dehydration.   Heat exhaustion.   Emotional distress.   Low blood sugar.   Internal bleeding.   Heart and circulatory problems.   Infections.  SYMPTOMS   Dizziness.   Feeling sick to your stomach (nauseous).   Nearly fainting.   Body numbness.   Turning pale.   Tunnel vision.   Weakness.  HOME CARE INSTRUCTIONS   Lie down right away if you start feeling like you might faint. Breathe deeply and steadily. Wait until all the symptoms have passed. Most of these episodes last only a few minutes. You may feel tired for several hours.   Drink enough fluids to keep your urine clear or pale yellow.   If you are taking  blood pressure or heart medicine, get up slowly, taking several minutes to sit and then stand. This can reduce dizziness that is caused by a drop in blood pressure.  SEEK IMMEDIATE MEDICAL CARE IF:   You have a severe headache.   Unusual pain develops in the chest, abdomen, or back.   There is bleeding from the mouth or rectum, or you have black or tarry stool.   An irregular heartbeat or a very rapid pulse develops.   You have repeated fainting or seizure-like jerking during an episode.   You faint when sitting or lying down.   You develop confusion.   You have difficulty walking.   Severe weakness develops.   Vision problems develop.  MAKE SURE YOU:   Understand these instructions.   Will watch your condition.   Will get help right away if you are not doing well or get worse.  Document Released: 10/17/2005 Document Revised: 10/06/2011 Document Reviewed: 12/03/2010 [employer] Patient Information  [address] [place].Urinary Tract Infection Infections of the urinary tract can start in several places. A bladder infection (cystitis), a kidney infection (pyelonephritis), and a prostate infection (prostatitis) are different types of urinary tract infections (UTIs). They usually get better if treated with medicines (antibiotics) that kill germs. Take all the medicine until it is gone. You or your child may feel better in a few days, but TAKE ALL MEDICINE or the infection may not respond and may become more difficult to treat. HOME CARE INSTRUCTIONS   Drink enough water and fluids to keep the urine clear or pale yellow. Cranberry juice is especially recommended, in addition to large amounts of water.   Avoid caffeine, tea, and carbonated beverages. They tend to irritate the bladder.   Alcohol may irritate the prostate.   Only take over-the-counter or prescription medicines for pain, discomfort, or fever as directed by your caregiver.  To prevent further infections:  Empty the  bladder often. Avoid holding urine for long periods of time.   After a bowel movement, women should cleanse from front to back. Use each tissue only once.   Empty the bladder before and after sexual intercourse.  FINDING OUT THE RESULTS OF YOUR TEST Not all test results are available during your visit. If your or your child's test results are not back during the visit, make an appointment with your caregiver to find out the results. Do not assume everything is normal if you have not heard from your caregiver or the medical facility. It is important for you to follow up on all test results. SEEK MEDICAL CARE IF:   There is back pain.   Your baby is older than 3 months with a rectal temperature of 100.5 F (38.1 C) or higher for more than 1 day.   Your or your child's problems (symptoms) are no better in 3 days. Return sooner if you or your child is getting worse.  SEEK IMMEDIATE MEDICAL CARE IF:   There is severe back pain or lower abdominal pain.   You or your child develops chills.   You have a fever.   Your baby is older than 3 months with a rectal temperature of 102 F (38.9 C) or higher.   Your baby is 57 months old or younger with a rectal temperature of 100.4 F (38 C) or higher.   There is nausea or vomiting.   There is continued burning or discomfort with urination.  MAKE SURE YOU:   Understand these instructions.   Will watch your condition.   Will get help right away if you are not doing well or get worse.  Document Released: 07/27/2005 Document Revised: 10/06/2011 Document Reviewed: 03/01/2007 [facility] Patient Information 2012 [place], [place].

## 2012-05-01 NOTE — ED Provider Notes (Signed)
History     CSN: [id number]  Arrival date & time 04/30/12  1649   First MD Initiated Contact with Patient 04/30/12 2240      Chief Complaint  Patient presents with  . Fall    (Consider location/radiation/quality/duration/timing/severity/associated sxs/prior treatment) HPI Comments: Patient here with recurrent falls and feeling like she is going to pass out - she states that she was admitted 2 weeks ago for similar things "but I dont remember why".  She states that when she walks she feels like her "left side is not there" - states that she thinks that her legs are buckleing under her - she denies headache, blurred vision, reports nausea, denies chest pain, feels like her left side is weak, denies slurred speech but states that she cannot remember things.  Review of the chart reveals that she had a syncopal episode that was likely related to hypoglycemia and that she remained hypoglycemic in the hospital with low insulin levels as well.  She reports chronic lower back pain and can tell me that she takes vicodin for it.  Also is able to recount the story of how embarrassing it was to almost fall in [place] and [employer] today at [employer] with her niece, [place].    Patient is a 40 y.o. female presenting with fall. The history is provided by the patient. No language interpreter was used.  Fall The accident occurred more than 2 days ago. The fall occurred while walking. She fell from a height of 3 to 5 ft. She landed on concrete. There was no blood loss. Point of impact: buttocks. The pain is at a severity of 5/10. The pain is moderate. She was ambulatory at the scene. There was no entrapment after the fall. There was no drug use involved in the accident. There was no alcohol use involved in the accident. Pertinent negatives include no visual change, no fever, no numbness, no abdominal pain, no bowel incontinence, no nausea, no vomiting, no hematuria, no headaches, no hearing loss, no loss of  consciousness and no tingling. The symptoms are aggravated by activity. She has tried nothing for the symptoms. The treatment provided no relief.    Past Medical History  Diagnosis Date  . Hypertension   . Deep venous thrombosis of leg   . Bacterial vaginosis 07-27-11  . Yeast infection   . Hx of bacterial infection   . Measles   . Hx of measles   . Migraines   . Hx of migraines   . History of chicken pox   . Hx: UTI (urinary tract infection)   . Vestibulitis of vagina 03/04/11  . Monilia infection 12/31/10  . Vaginal atrophy 03/04/11  . Dyspareunia 03/04/11  . Chronic pelvic pain in female 08/14/2003  . Depression   . Bipolar 1 disorder     Past Surgical History  Procedure Date  . Total knee revision     right knee  . Cesarean section 1998  . Tonsillectomy   . Wisdom tooth extraction   . Cholecystectomy   . Left elbow   . Abdominal hysterectomy 2000  . Appendectomy     Family History  Problem Relation Age of Onset  . Cancer Maternal Aunt     History  Substance Use Topics  . Smoking status: Current Everyday Smoker -- 0.3 packs/day    Types: Cigarettes  . Smokeless tobacco: Never Used  . Alcohol Use: No    OB History    Grav Para Term Preterm Abortions  TAB SAB Ect Mult Living   1 1 1       1       Review of Systems  Constitutional: Negative for fever and chills.  HENT: Negative for neck pain.   Eyes: Negative for pain.  Respiratory: Negative for chest tightness and shortness of breath.   Cardiovascular: Negative for chest pain.  Gastrointestinal: Negative for nausea, vomiting, abdominal pain and bowel incontinence.  Genitourinary: Negative for hematuria.  Musculoskeletal: Positive for back pain and arthralgias.  Skin: Negative for rash.  Neurological: Positive for dizziness and weakness. Negative for tingling, loss of consciousness, syncope, facial asymmetry, numbness and headaches.  All other systems reviewed and are negative.    Allergies  Codeine;  Morphine and related; Naproxen; and Sulfonamide derivatives  Home Medications   Current Outpatient Rx  Name Route Sig Dispense Refill  . ARIPIPRAZOLE 5 MG PO TABS Oral Take 7.5 mg by mouth daily.    . BUPROPION HCL ER (XL) 300 MG PO TB24 Oral Take 300 mg by mouth daily.    Marland Kitchen CLONAZEPAM 0.5 MG PO TABS Oral Take 0.5 mg by mouth 2 (two) times daily. Scheduled. For anxiety    . CYCLOBENZAPRINE HCL 10 MG PO TABS Oral Take 10 mg by mouth 3 (three) times daily as needed. For muscle spasms    . DIVALPROEX SODIUM ER 500 MG PO TB24 Oral Take 1,000 mg by mouth at bedtime.    Marland Kitchen GABAPENTIN 100 MG PO CAPS Oral Take 300 mg by mouth daily.    . NYSTATIN 100000 UNIT/GM EX POWD Topical Apply 100,000 g topically 2 (two) times daily as needed. For yeast. Apply under breast.    . OMEGA-3-ACID ETHYL ESTERS 1 G PO CAPS Oral Take 2 g by mouth 2 (two) times daily.    Marland Kitchen SIMVASTATIN 40 MG PO TABS Oral Take 40 mg by mouth every morning.     . TRAZODONE HCL 100 MG PO TABS Oral Take 200 mg by mouth at bedtime.       BP 107/82  Pulse 83  Temp 99 F (37.2 C) (Oral)  Resp 16  SpO2 97%  Physical Exam  Nursing note and vitals reviewed. Constitutional: She is oriented to person, place, and time. She appears well-developed and well-nourished. No distress.       Alternates with crying  HENT:  Head: Normocephalic and atraumatic.  Right Ear: External ear normal.  Left Ear: External ear normal.  Nose: Nose normal.  Mouth/Throat: Oropharynx is clear and moist. No oropharyngeal exudate.       No facial droop - tongue midline  Eyes: Conjunctivae are normal. Pupils are equal, round, and reactive to light. No scleral icterus.       No nystagmus  Neck: Normal range of motion. Neck supple.  Cardiovascular: Normal rate, regular rhythm and normal heart sounds.  Exam reveals no gallop and no friction rub.   No murmur heard. Pulmonary/Chest: Effort normal and breath sounds normal. No respiratory distress. She has no wheezes.  She has no rales. She exhibits no tenderness.  Abdominal: Soft. Bowel sounds are normal. She exhibits no distension. There is no tenderness. There is no rebound and no guarding.       obese  Musculoskeletal: Normal range of motion. She exhibits no edema and no tenderness.  Lymphadenopathy:    She has no cervical adenopathy.  Neurological: She is alert and oriented to person, place, and time. She has normal reflexes. No cranial nerve deficit. She exhibits normal muscle  tone. Coordination normal.       Attempted to do pronator drift but patient kept holding hands up above her head - however when I attempted grip strength she was unable to grasp my hands.  Was able to ambulate without difficulty though attempted to "fall"  Skin: Skin is warm and dry. No rash noted. No erythema. No pallor.  Psychiatric: She has a normal mood and affect. Her behavior is normal. Judgment and thought content normal.    ED Course  Procedures (including critical care time)  Labs Reviewed  CBC WITH DIFFERENTIAL - Abnormal; Notable for the following:    Platelets 142 (*)     All other components within normal limits  BASIC METABOLIC PANEL - Abnormal; Notable for the following:    GFR calc non Af Amer 65 (*)     GFR calc Af Amer 75 (*)     All other components within normal limits  URINALYSIS, ROUTINE W REFLEX MICROSCOPIC - Abnormal; Notable for the following:    Color, Urine AMBER (*)  BIOCHEMICALS MAY BE AFFECTED BY COLOR   APPearance CLOUDY (*)     Specific Gravity, Urine 1.035 (*)     Ketones, ur TRACE (*)     Leukocytes, UA MODERATE (*)     All other components within normal limits  URINE MICROSCOPIC-ADD ON - Abnormal; Notable for the following:    Squamous Epithelial / LPF FEW (*)     Bacteria, UA FEW (*)     All other components within normal limits  POCT PREGNANCY, URINE   Ct Head Wo Contrast  05/01/2012  *RADIOLOGY REPORT*  Clinical Data: Fall, hypertension.  CT HEAD WITHOUT CONTRAST  Technique:   Contiguous axial images were obtained from the base of the skull through the vertex without contrast.  Comparison: 04/17/2012  Findings: Cerebellar volume loss. There is no evidence for acute hemorrhage, hydrocephalus, mass lesion, or abnormal extra-axial fluid collection.  No definite CT evidence for acute infarction. The visualized paranasal sinuses and mastoid air cells are predominately clear.  No displaced calvarial fracture.  IMPRESSION: Cerebellar volume loss.  No definite acute intracranial abnormality.  Original Report Authenticated By: Waneta Martins, M.D.     Falls UTI  MDM  Patient here with multiple frequents falls with a recent syncopal workup who presents with left sided weakness, there is no acute intercranial abnormality on CT scan clinically I do not find any abnormalities and an inconsistent neurological exam - I do not find any reason to admit the patient though she feels like she needs to be admitted.  She has an appointment with her PCP at [facility] on the 3rd (tomorrow).        Izola Price [place], [place] 05/01/12 [phone]

## 2012-05-14 ENCOUNTER — Telehealth: Payer: Self-pay | Admitting: Obstetrics and Gynecology

## 2012-05-14 NOTE — Telephone Encounter (Signed)
[facility] RX'D PT MED

## 2012-05-14 NOTE — Telephone Encounter (Signed)
Tc to pt per [facility] recs. Informed pt to start OTC Vit D 4,000 units daily and repeat Vit D level x 12weeks. Pt put on recall list for 12 week reminder. Pt agrees.

## 2012-05-14 NOTE — Telephone Encounter (Signed)
Tc from pt per telephone call. Pt states,"insurance will not pay for Vit D 50K". Pt was pres meds in 6/013 for 12 weeks due to a Vit D of 24. Will consult with [facility] per recs rgdg tx. Pt voices understanding.

## 2012-05-14 NOTE — Telephone Encounter (Signed)
TRIAGE/RX °

## 2012-05-28 ENCOUNTER — Other Ambulatory Visit: Payer: Self-pay | Admitting: Orthopaedic Surgery

## 2012-05-28 DIAGNOSIS — M545 Low back pain: Secondary | ICD-10-CM

## 2012-05-29 ENCOUNTER — Emergency Department (HOSPITAL_COMMUNITY)
Admission: EM | Admit: 2012-05-29 | Discharge: 2012-05-29 | Disposition: A | Discharge status: Home / Self Care | Payer: Medicaid Other | Source: Home / Self Care | Attending: Emergency Medicine | Admitting: Emergency Medicine

## 2012-05-29 ENCOUNTER — Encounter (HOSPITAL_COMMUNITY): Payer: Self-pay

## 2012-05-29 DIAGNOSIS — N390 Urinary tract infection, site not specified: Secondary | ICD-10-CM

## 2012-05-29 LAB — URINE MICROSCOPIC-ADD ON

## 2012-05-29 LAB — URINALYSIS, ROUTINE W REFLEX MICROSCOPIC
Glucose, UA: NEGATIVE mg/dL
Protein, ur: >300 mg/dL — AB
Specific Gravity, Urine: 1.025 (ref 1.005–1.030)
Urobilinogen, UA: 1 mg/dL (ref 0.0–1.0)

## 2012-05-29 LAB — POCT URINALYSIS DIP (DEVICE)
Glucose, UA: NEGATIVE mg/dL
Nitrite: POSITIVE — AB
Protein, ur: >=300 mg/dL — AB
Specific Gravity, Urine: 1.025 (ref 1.005–1.030)
Urobilinogen, UA: 1 mg/dL (ref 0.0–1.0)

## 2012-05-29 MED ORDER — PHENAZOPYRIDINE HCL 200 MG PO TABS: 200.0000 mg | ORAL_TABLET | Freq: Three times a day (TID) | ORAL | Status: AC | PRN

## 2012-05-29 MED ORDER — CEPHALEXIN 500 MG PO CAPS: 500.0000 mg | ORAL_CAPSULE | Freq: Four times a day (QID) | ORAL | Status: AC

## 2012-05-29 MED ORDER — CEFTRIAXONE SODIUM 1 G IJ SOLR
1.0000 g | Freq: Once | INTRAMUSCULAR | Status: AC
  Administered 2012-05-29: 1 g via INTRAMUSCULAR

## 2012-05-29 MED ORDER — LIDOCAINE HCL (PF) 1 % IJ SOLN
INTRAMUSCULAR | Status: AC
  Filled 2012-05-29: qty 5

## 2012-05-29 MED ORDER — CEFTRIAXONE SODIUM 1 G IJ SOLR
INTRAMUSCULAR | Status: AC
  Filled 2012-05-29: qty 10

## 2012-05-29 NOTE — ED Notes (Signed)
C/o rt flank pain for 4 days and hematuria, urgency and frequency since yesterday.

## 2012-05-29 NOTE — ED Provider Notes (Signed)
History     CSN: [id number]  Arrival date & time 05/29/12  1530   First MD Initiated Contact with Patient 05/29/12 1619      Chief Complaint  Patient presents with  . Hematuria    (Consider location/radiation/quality/duration/timing/severity/associated sxs/prior treatment) HPI Comments: Patient reports urinary urgency, frequency, dysuria, passing "clots" in her urine. Reports sharp, shooting pains going up her right flank into her right back. No fevers.Reports nausea, no vomiting. No vaginal bleeding or discharge. She is status post hysterectomy. No family history of kidney stones. Patient has history of recurrent UTIs, and was treated with Cipro earlier this month for UTI with complete resolution of her symptoms.   ROS as noted in HPI. All other ROS negative.   Patient is a 41 y.o. female presenting with hematuria. The history is provided by the patient. No language interpreter was used.  Hematuria This is a new problem. The current episode started in the past 7 days. The problem has been gradually worsening since onset. She describes the hematuria as gross hematuria. Irritative symptoms include frequency and urgency. Associated symptoms include dysuria, flank pain and nausea. Pertinent negatives include no abdominal pain, chills, fever, inability to urinate or vomiting. She is sexually active. Her past medical history is significant for tobacco use. There is no history of kidney stones.    Past Medical History  Diagnosis Date  . Hypertension   . Deep venous thrombosis of leg 2012    completed year of coumadin   . Bacterial vaginosis 07-27-11  . Yeast infection   . Hx of bacterial infection   . Measles   . Hx of measles   . Migraines   . Hx of migraines   . History of chicken pox   . Hx: UTI (urinary tract infection)   . Vestibulitis of vagina 03/04/11  . Monilia infection 12/31/10  . Vaginal atrophy 03/04/11  . Dyspareunia 03/04/11  . Chronic pelvic pain in female 08/14/2003  .  Depression   . Bipolar 1 disorder     Past Surgical History  Procedure Date  . Total knee revision     right knee  . Cesarean section 1998  . Tonsillectomy   . Wisdom tooth extraction   . Cholecystectomy   . Left elbow   . Abdominal hysterectomy 2000  . Appendectomy     Family History  Problem Relation Age of Onset  . Cancer Maternal Aunt     History  Substance Use Topics  . Smoking status: Current Everyday Smoker -- 0.3 packs/day    Types: Cigarettes  . Smokeless tobacco: Never Used  . Alcohol Use: No    OB History    Grav Para Term Preterm Abortions TAB SAB Ect Mult Living   1 1 1       1       Review of Systems  Constitutional: Negative for fever and chills.  Gastrointestinal: Positive for nausea. Negative for vomiting and abdominal pain.  Genitourinary: Positive for dysuria, urgency, frequency, hematuria and flank pain.    Allergies  Codeine; Morphine and related; Naproxen; and Sulfonamide derivatives  Home Medications   Current Outpatient Rx  Name Route Sig Dispense Refill  . ARIPIPRAZOLE 5 MG PO TABS Oral Take 7.5 mg by mouth daily.    . BUPROPION HCL ER (XL) 300 MG PO TB24 Oral Take 300 mg by mouth daily.    Marland Kitchen CLONAZEPAM 0.5 MG PO TABS Oral Take 0.5 mg by mouth 2 (two) times daily. Scheduled. For anxiety    .  CYCLOBENZAPRINE HCL 10 MG PO TABS Oral Take 10 mg by mouth 3 (three) times daily as needed. For muscle spasms    . DIVALPROEX SODIUM ER 500 MG PO TB24 Oral Take 1,000 mg by mouth at bedtime.    . OMEGA-3 FATTY ACIDS 1000 MG PO CAPS Oral Take 2 g by mouth daily.    Marland Kitchen GABAPENTIN 100 MG PO CAPS Oral Take 300 mg by mouth daily.    . NYSTATIN 100000 UNIT/GM EX POWD Topical Apply 100,000 g topically 2 (two) times daily as needed. For yeast. Apply under breast.    . SIMVASTATIN 40 MG PO TABS Oral Take 40 mg by mouth every morning.     . TRAZODONE HCL 100 MG PO TABS Oral Take 200 mg by mouth at bedtime.     . CEPHALEXIN 500 MG PO CAPS Oral Take 1 capsule  (500 mg total) by mouth 4 (four) times daily. X 2 weeks 56 capsule 0  . OMEGA-3-ACID ETHYL ESTERS 1 G PO CAPS Oral Take 2 g by mouth 2 (two) times daily.    Marland Kitchen PHENAZOPYRIDINE HCL 200 MG PO TABS Oral Take 1 tablet (200 mg total) by mouth 3 (three) times daily as needed for pain. 6 tablet 0    BP 110/79  Pulse 85  Temp 98.2 F (36.8 C) (Oral)  Resp 18  SpO2 99%  Physical Exam  Nursing note and vitals reviewed. Constitutional: She is oriented to person, place, and time. She appears well-developed and well-nourished. No distress.  HENT:  Head: Normocephalic and atraumatic.  Eyes: Conjunctivae and EOM are normal.  Neck: Normal range of motion.  Cardiovascular: Normal rate.   Pulmonary/Chest: Effort normal.  Abdominal: Soft. She exhibits no distension and no mass. There is tenderness. There is CVA tenderness. There is no rebound and no guarding.       Right flank, right CVA tenderness.  Musculoskeletal: Normal range of motion.  Neurological: She is alert and oriented to person, place, and time. Coordination normal.  Skin: Skin is warm and dry.  Psychiatric: She has a normal mood and affect. Her behavior is normal. Judgment and thought content normal.    ED Course  Procedures (including critical care time)  Labs Reviewed  POCT URINALYSIS DIP (DEVICE) - Abnormal; Notable for the following:    Bilirubin Urine MODERATE (*)     Ketones, ur 15 (*)     Hgb urine dipstick LARGE (*)     Protein, ur >=300 (*)     Nitrite POSITIVE (*)     Leukocytes, UA SMALL (*)  Biochemical Testing Only. Please order routine urinalysis from main lab if confirmatory testing is needed.   All other components within normal limits  URINE CULTURE  URINALYSIS, ROUTINE W REFLEX MICROSCOPIC   No results found.   1. Complicated UTI (urinary tract infection)       MDM  Previous records reviewed. History of recurrent UTIs. Calcium oxalate crystals in one sample, but none since. No urine culture since 2010.    Treating as complicated UTI/pyelonephritis given patient's history and physical. No history of fever, afebrile here. History of recurrent UTIs. No personal history of kidney stones, although this is in the differential. Also given her history smoking, bladder cancer in the differential. Will have her strain all of her urine and will have her followup with Dr. Patsi Sears, urologist on call, if she starts passing stones or has persistent hematuria after finishing treatment. Sending urine off for culture. Given 1 g Rocephin  IM here. Sending home on 14 days of Keflex, as she recently completed a course of Cipro. Patient agrees to provide working phone number Discussed signs and symptoms that should prompt her return to the department. Patient agrees with plan   Luiz Blare, MD 05/29/12 Rickey Primus

## 2012-05-31 NOTE — ED Notes (Deleted)
Urine culture: >100,000 colonies Klebsiella Pneumoniae.  Pt. Adequately treated with Keflex. [name] 05/31/2012

## 2012-06-01 LAB — URINE CULTURE

## 2012-06-01 NOTE — ED Notes (Signed)
Urine culture: >100,000 colonies E. Coli.  Pt. adequately treated with Keflex. [name] 06/01/2012

## 2012-06-04 ENCOUNTER — Inpatient Hospital Stay: Admission: RE | Admit: 2012-06-04 | Payer: Medicaid Other | Source: Ambulatory Visit

## 2012-06-07 ENCOUNTER — Ambulatory Visit
Admission: RE | Admit: 2012-06-07 | Discharge: 2012-06-07 | Disposition: A | Discharge status: Home / Self Care | Payer: Medicaid Other | Source: Ambulatory Visit | Attending: Orthopaedic Surgery | Admitting: Orthopaedic Surgery

## 2012-06-07 DIAGNOSIS — M545 Low back pain: Secondary | ICD-10-CM

## 2012-07-20 ENCOUNTER — Telehealth: Payer: Self-pay | Admitting: Obstetrics and Gynecology

## 2012-07-20 MED ORDER — VITAMIN D3 1.25 MG (50000 UT) PO CAPS: 1.0000 | ORAL_CAPSULE | ORAL | Status: DC

## 2012-07-20 NOTE — Telephone Encounter (Signed)
[facility] pt 

## 2012-07-20 NOTE — Telephone Encounter (Signed)
Tc to pt per telephone call. Pt would like rx for Vit D called to pharm. Pt was unable to fill rx for med pres in 04/2012 by [facility] due to financial reasons. Pt was given alt rec to start otc Vit D 4,000 IU daily;however pt did not start. Rx for Vit D 50k e-pres to pharm on file. AEX sched 10-02-12 with [facility] and will recheck Vit D level @ that time. Pt agrees.

## 2012-07-20 NOTE — Telephone Encounter (Signed)
Triage/chng of info.

## 2012-07-20 NOTE — Telephone Encounter (Signed)
Chandra/general [facility]

## 2012-07-27 ENCOUNTER — Telehealth: Payer: Self-pay

## 2012-07-27 NOTE — Telephone Encounter (Signed)
Tc from pt. Pt informed per telephone call.  Pt agrees.

## 2012-07-27 NOTE — Telephone Encounter (Signed)
Lm on vm to cb per correction of aex due in 02/2013 and not in 09/2012. Pt put on recall list for reminder of Vit D level recheck x 12 weeks.

## 2012-07-29 ENCOUNTER — Encounter (HOSPITAL_COMMUNITY): Payer: Self-pay

## 2012-07-29 ENCOUNTER — Emergency Department (INDEPENDENT_AMBULATORY_CARE_PROVIDER_SITE_OTHER)
Admission: EM | Admit: 2012-07-29 | Discharge: 2012-07-29 | Disposition: A | Discharge status: Home / Self Care | Payer: Medicaid Other | Source: Home / Self Care | Attending: Family Medicine | Admitting: Family Medicine

## 2012-07-29 DIAGNOSIS — S86919A Strain of unspecified muscle(s) and tendon(s) at lower leg level, unspecified leg, initial encounter: Secondary | ICD-10-CM

## 2012-07-29 DIAGNOSIS — S838X9A Sprain of other specified parts of unspecified knee, initial encounter: Secondary | ICD-10-CM

## 2012-07-29 MED ORDER — HYDROCODONE-ACETAMINOPHEN 5-325 MG PO TABS
ORAL_TABLET | ORAL | Status: AC
  Filled 2012-07-29: qty 1

## 2012-07-29 MED ORDER — HYDROCODONE-ACETAMINOPHEN 5-325 MG PO TABS
1.0000 | ORAL_TABLET | Freq: Once | ORAL | Status: AC
  Administered 2012-07-29: 1 via ORAL

## 2012-07-29 NOTE — ED Provider Notes (Signed)
History     CSN: [id number]  Arrival date & time 07/29/12  1901   First MD Initiated Contact with Patient 07/29/12 1903      Chief Complaint  Patient presents with  . Knee Pain    (Consider location/radiation/quality/duration/timing/severity/associated sxs/prior treatment) Patient is a 41 y.o. female presenting with knee pain. The history is provided by the patient.  Knee Pain This is a new problem. The current episode started 6 to 12 hours ago (felt pop in right knee walking this am, ). The problem has not changed since onset.The symptoms are aggravated by walking and bending.    Past Medical History  Diagnosis Date  . Hypertension   . Deep venous thrombosis of leg 2012    completed year of coumadin   . Bacterial vaginosis 07-27-11  . Yeast infection   . Hx of bacterial infection   . Measles   . Hx of measles   . Migraines   . Hx of migraines   . History of chicken pox   . Hx: UTI (urinary tract infection)   . Vestibulitis of vagina 03/04/11  . Monilia infection 12/31/10  . Vaginal atrophy 03/04/11  . Dyspareunia 03/04/11  . Chronic pelvic pain in female 08/14/2003  . Depression   . Bipolar 1 disorder     Past Surgical History  Procedure Date  . Total knee revision     right knee  . Cesarean section 1998  . Tonsillectomy   . Wisdom tooth extraction   . Cholecystectomy   . Left elbow   . Abdominal hysterectomy 2000  . Appendectomy     Family History  Problem Relation Age of Onset  . Cancer Maternal Aunt     History  Substance Use Topics  . Smoking status: Current Every Day Smoker -- 0.3 packs/day    Types: Cigarettes  . Smokeless tobacco: Never Used  . Alcohol Use: No    OB History    Grav Para Term Preterm Abortions TAB SAB Ect Mult Living   1 1 1       1       Review of Systems  Constitutional: Negative.   Musculoskeletal: Positive for joint swelling and gait problem.    Allergies  Codeine; Morphine and related; Naproxen; and Sulfonamide  derivatives  Home Medications   Current Outpatient Rx  Name Route Sig Dispense Refill  . ARIPIPRAZOLE 5 MG PO TABS Oral Take 7.5 mg by mouth daily.    . BUPROPION HCL ER (XL) 300 MG PO TB24 Oral Take 300 mg by mouth daily.    Marland Kitchen VITAMIN D3 50000 UNITS PO CAPS Oral Take 1 capsule by mouth once a week. 12 capsule 0    Pt to take 1 capsule by mouth weekly x 12 weeks  . CLONAZEPAM 0.5 MG PO TABS Oral Take 0.5 mg by mouth 2 (two) times daily. Scheduled. For anxiety    . CYCLOBENZAPRINE HCL 10 MG PO TABS Oral Take 10 mg by mouth 3 (three) times daily as needed. For muscle spasms    . DIVALPROEX SODIUM ER 500 MG PO TB24 Oral Take 1,000 mg by mouth at bedtime.    . OMEGA-3 FATTY ACIDS 1000 MG PO CAPS Oral Take 2 g by mouth daily.    Marland Kitchen GABAPENTIN 100 MG PO CAPS Oral Take 300 mg by mouth daily.    . NYSTATIN 100000 UNIT/GM EX POWD Topical Apply 100,000 g topically 2 (two) times daily as needed. For yeast. Apply under breast.    .  OMEGA-3-ACID ETHYL ESTERS 1 G PO CAPS Oral Take 2 g by mouth 2 (two) times daily.    Marland Kitchen SIMVASTATIN 40 MG PO TABS Oral Take 40 mg by mouth every morning.     . TRAZODONE HCL 100 MG PO TABS Oral Take 200 mg by mouth at bedtime.       BP 113/84  Pulse 76  Temp 98.3 F (36.8 C) (Oral)  Resp 18  SpO2 96%  Physical Exam  Nursing note and vitals reviewed. Constitutional: She is oriented to person, place, and time. She appears well-developed and well-nourished.  Musculoskeletal: She exhibits tenderness.       Right knee: She exhibits decreased range of motion and swelling. She exhibits normal alignment, no LCL laxity and no MCL laxity. tenderness found. Medial joint line, lateral joint line and patellar tendon tenderness noted.       Legs: Neurological: She is alert and oriented to person, place, and time.  Skin: Skin is warm and dry.    ED Course  Procedures (including critical care time)  Labs Reviewed - No data to display No results found.   1. Strain of knee         MDM          Linna Hoff, MD 07/29/12 [phone]

## 2012-07-29 NOTE — ED Notes (Signed)
States she was walking down the hall in her home, and heard her knee pop, denies trauma

## 2012-08-14 ENCOUNTER — Encounter: Payer: Self-pay | Admitting: Internal Medicine

## 2012-08-14 ENCOUNTER — Ambulatory Visit (INDEPENDENT_AMBULATORY_CARE_PROVIDER_SITE_OTHER): Payer: Medicaid Other | Admitting: Internal Medicine

## 2012-08-14 ENCOUNTER — Ambulatory Visit (HOSPITAL_COMMUNITY)
Admission: RE | Admit: 2012-08-14 | Discharge: 2012-08-14 | Disposition: A | Discharge status: Home / Self Care | Payer: Medicaid Other | Source: Ambulatory Visit | Attending: Internal Medicine | Admitting: Internal Medicine

## 2012-08-14 VITALS — BP 94/67 | HR 85 | Temp 98.8°F | Ht 66.0 in | Wt 192.0 lb

## 2012-08-14 DIAGNOSIS — F17200 Nicotine dependence, unspecified, uncomplicated: Secondary | ICD-10-CM

## 2012-08-14 DIAGNOSIS — Z720 Tobacco use: Secondary | ICD-10-CM

## 2012-08-14 DIAGNOSIS — F319 Bipolar disorder, unspecified: Secondary | ICD-10-CM

## 2012-08-14 DIAGNOSIS — E039 Hypothyroidism, unspecified: Secondary | ICD-10-CM

## 2012-08-14 DIAGNOSIS — M25561 Pain in right knee: Secondary | ICD-10-CM

## 2012-08-14 DIAGNOSIS — M25569 Pain in unspecified knee: Secondary | ICD-10-CM

## 2012-08-14 DIAGNOSIS — I10 Essential (primary) hypertension: Secondary | ICD-10-CM

## 2012-08-14 DIAGNOSIS — N951 Menopausal and female climacteric states: Secondary | ICD-10-CM

## 2012-08-14 DIAGNOSIS — E7800 Pure hypercholesterolemia, unspecified: Secondary | ICD-10-CM

## 2012-08-14 HISTORY — DX: Tobacco use: Z72.0

## 2012-08-14 HISTORY — DX: Pain in right knee: M25.561

## 2012-08-14 LAB — HEPATIC FUNCTION PANEL
Albumin: 4 g/dL (ref 3.5–5.2)
Bilirubin, Direct: 0.1 mg/dL (ref 0.0–0.3)
Total Bilirubin: 0.3 mg/dL (ref 0.3–1.2)

## 2012-08-14 LAB — LIPID PANEL
HDL: 58 mg/dL (ref >39)
LDL Cholesterol: 69 mg/dL (ref 0–99)
Total CHOL/HDL Ratio: 2.9 Ratio

## 2012-08-14 MED ORDER — HYDROCODONE-ACETAMINOPHEN 5-325 MG PO TABS: 1.0000 | ORAL_TABLET | Freq: Four times a day (QID) | ORAL | Status: DC | PRN

## 2012-08-14 NOTE — Assessment & Plan Note (Signed)
BP low today on no anti-hypertensives.  It is unclear if the patient has been hypertensive in the past.  Will remove this problem from problem list.

## 2012-08-14 NOTE — Assessment & Plan Note (Signed)
The patient notes acute right knee pain after a mechanical fall while walking her dog, which has not improved in 2 weeks.  Given her history of right knee replacement, we'll obtain x-ray today to rule out hardware damage. -R knee x-ray, complete -hydrocodone SHORT-TERM ONLY for pain relief.  The short-term nature of this medication was explained to the patient -if x-ray abnormal or pain persists with normal x-ray, patient will likely need referral to Dr. Cleophas Dunker, Orthopedics

## 2012-08-14 NOTE — Patient Instructions (Signed)
Your knee pain is likely due to a muscle strain.  Since you have had a knee replacement in the past, we will get an x-ray of your knee today, and will call you if the results are abnormal. -for short-term pain relief only, we are prescribing hydrocodone.  Take 1 tablet up to every 6 hours for pain.  We are checking a cholesterol panel today.  Please return for a follow-up visit in 5-7 months.

## 2012-08-14 NOTE — Assessment & Plan Note (Signed)
The patient has a reported history of hyperlipidemia.  Last LDL = 86, but TG = 308 in 0865.  Patient currently on simvastatin and fish oil. -continue simvastatin, fish oil -check lipid panel and LFT's today

## 2012-08-14 NOTE — Assessment & Plan Note (Signed)
The patient notes smoking about 1/2 PPD.  She reports quitting cold [place] for 1 year, about 1 year ago, but recently restarted a few months ago.  She states she is not ready to think about quitting at this time.
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
## 2012-08-15 ENCOUNTER — Other Ambulatory Visit: Payer: Self-pay | Admitting: Obstetrics and Gynecology

## 2012-08-16 ENCOUNTER — Telehealth: Payer: Self-pay | Admitting: Licensed Clinical Social Worker

## 2012-08-16 ENCOUNTER — Telehealth: Payer: Self-pay | Admitting: Obstetrics and Gynecology

## 2012-08-16 NOTE — Telephone Encounter (Signed)
Spoke with pt rgd msg pt wants refill on nystatin for yeast infection under breast advised pt need eval for refill pt states have appt in December for aex advised pt can make sooner appt for eval of yeast pt hung up

## 2012-08-16 NOTE — Telephone Encounter (Signed)
Pt states has had recurrence of rash under her breast. No drainage but is very irritated.Requesting Rf of Nystatin powder.  DR VPH to be made aware.

## 2012-08-16 NOTE — Telephone Encounter (Signed)
Try calling pt rgd msg no answer unable to leave msg 

## 2012-08-16 NOTE — Telephone Encounter (Signed)
CSW received request for PCS assessment for [name].  CSW placed call to pt to indicate PCP and chart records indicate pt is independent with ADL's.  Pt aware and voiced understanding.

## 2012-08-16 NOTE — Telephone Encounter (Signed)
VM from pt. Wants Rf to Nystatin cream for yeast infection.  [facility]  Pt [phone]

## 2012-08-16 NOTE — Telephone Encounter (Signed)
Spoke with [name] msg Chase wants refill on nystain cream for yeast infection under breast informed Chase need eval Chase declined appt and hung up

## 2012-08-17 ENCOUNTER — Telehealth: Payer: Self-pay | Admitting: Obstetrics and Gynecology

## 2012-08-17 MED ORDER — NYSTATIN 100000 UNIT/GM EX POWD: 100000.0000 g | Freq: Two times a day (BID) | CUTANEOUS | Status: DC | PRN

## 2012-08-17 NOTE — Telephone Encounter (Signed)
TC to pt. Informed Rx was ordered.  Per Dr Maryellen Pile, if recurs or no relief to have eval by dematologist. Pt verbalizes comprehension.

## 2012-08-17 NOTE — Telephone Encounter (Signed)
Message copied by Mason Jim on Fri Aug 17, 2012 12:29 PM ------      Message from: Hal Morales      Created: Fri Aug 17, 2012 11:18 AM        Okay to refill prescription once. Please notify patient that if rash recurs or is not relieved she should make an appointment with a dermatologist. Thank you      ----- Message -----         From: Constance Haw, RN         Sent: 08/17/2012   8:24 AM           To: Hal Morales, MD            Mason Jim, RN  08/16/2012  4:25 PM  Signed      Pt states has had recurrence of rash under her breast. No drainage but is very irritated.Requesting Rf of Nystatin powder.  DR VPH to be made aware.       You last saw her 02/2012.  The Rx was prescribed 12/2010 by you which was the only time I see any notes about a breast rash. Pt declined appt. Thank you.

## 2012-08-23 ENCOUNTER — Telehealth: Payer: Self-pay | Admitting: *Deleted

## 2012-08-23 MED ORDER — MELOXICAM 7.5 MG PO TABS: 7.5000 mg | ORAL_TABLET | Freq: Every day | ORAL | Status: DC

## 2012-08-23 NOTE — Telephone Encounter (Signed)
I returned the patient's phone call.  She confirmed that she is continuing to have knee pain, as described at our appointment 9 days ago.  The pain is slowly improving, but is not significantly better.  She has about 7 pills remaining of the 60 tablets of Norco prescribed at our last visit.  We discussed the side effects of narcotic medications, and I advised the patient that further such medications would likely not be the best treatment for her condition.  Instead, we will try Meloxicam, 1 tablet daily for pain relief.  Patient has an appointment to follow-up in clinic in 4 days.  Patient expressed understanding and thanks.

## 2012-08-23 NOTE — Telephone Encounter (Signed)
Pt calls and states she needs a refill today on hydrocodone but that she will be needing the hydrocodone 10's from now on, i called her back and informed her that she would need an appt for a refill and especially to increase the strength, i gave her an appt for mon 10/28 at 1315 then after telling me she was not a "drug seeker and dr brown told her he would periodically give her hydrocodone" stated she would need to speak w/ dr brown not me. Her ph# [phone]

## 2012-08-27 ENCOUNTER — Ambulatory Visit: Payer: Medicaid Other | Admitting: Internal Medicine

## 2012-08-29 ENCOUNTER — Other Ambulatory Visit: Payer: Self-pay | Admitting: *Deleted

## 2012-08-30 ENCOUNTER — Encounter (HOSPITAL_COMMUNITY): Payer: Self-pay | Admitting: Adult Health

## 2012-08-30 ENCOUNTER — Emergency Department (HOSPITAL_COMMUNITY)
Admission: EM | Admit: 2012-08-30 | Discharge: 2012-08-31 | Disposition: A | Discharge status: Home / Self Care | Payer: Medicaid Other | Attending: Emergency Medicine | Admitting: Emergency Medicine

## 2012-08-30 DIAGNOSIS — Z86718 Personal history of other venous thrombosis and embolism: Secondary | ICD-10-CM | POA: Insufficient documentation

## 2012-08-30 DIAGNOSIS — Z791 Long term (current) use of non-steroidal anti-inflammatories (NSAID): Secondary | ICD-10-CM | POA: Insufficient documentation

## 2012-08-30 DIAGNOSIS — F319 Bipolar disorder, unspecified: Secondary | ICD-10-CM | POA: Insufficient documentation

## 2012-08-30 DIAGNOSIS — Z79899 Other long term (current) drug therapy: Secondary | ICD-10-CM | POA: Insufficient documentation

## 2012-08-30 DIAGNOSIS — R112 Nausea with vomiting, unspecified: Secondary | ICD-10-CM | POA: Insufficient documentation

## 2012-08-30 DIAGNOSIS — F17200 Nicotine dependence, unspecified, uncomplicated: Secondary | ICD-10-CM | POA: Insufficient documentation

## 2012-08-30 DIAGNOSIS — N952 Postmenopausal atrophic vaginitis: Secondary | ICD-10-CM | POA: Insufficient documentation

## 2012-08-30 DIAGNOSIS — Z8619 Personal history of other infectious and parasitic diseases: Secondary | ICD-10-CM | POA: Insufficient documentation

## 2012-08-30 DIAGNOSIS — IMO0002 Reserved for concepts with insufficient information to code with codable children: Secondary | ICD-10-CM | POA: Insufficient documentation

## 2012-08-30 DIAGNOSIS — N390 Urinary tract infection, site not specified: Secondary | ICD-10-CM | POA: Insufficient documentation

## 2012-08-30 DIAGNOSIS — E039 Hypothyroidism, unspecified: Secondary | ICD-10-CM | POA: Insufficient documentation

## 2012-08-30 LAB — CBC WITH DIFFERENTIAL/PLATELET
Basophils Absolute: 0 10*3/uL (ref 0.0–0.1)
Basophils Relative: 0 % (ref 0–1)
HCT: 40.6 % (ref 36.0–46.0)
MCHC: 34 g/dL (ref 30.0–36.0)
Monocytes Absolute: 0.6 10*3/uL (ref 0.1–1.0)
Neutro Abs: 3.5 10*3/uL (ref 1.7–7.7)
Neutrophils Relative %: 40 % — ABNORMAL LOW (ref 43–77)
Platelets: 154 10*3/uL (ref 150–400)
RDW: 12.4 % (ref 11.5–15.5)

## 2012-08-30 LAB — URINALYSIS, MICROSCOPIC ONLY
Glucose, UA: NEGATIVE mg/dL
Nitrite: NEGATIVE
Specific Gravity, Urine: 1.028 (ref 1.005–1.030)
pH: 7.5 (ref 5.0–8.0)

## 2012-08-30 LAB — COMPREHENSIVE METABOLIC PANEL
AST: 13 U/L (ref 0–37)
Albumin: 3.3 g/dL — ABNORMAL LOW (ref 3.5–5.2)
Chloride: 103 mEq/L (ref 96–112)
Creatinine, Ser: 1.12 mg/dL — ABNORMAL HIGH (ref 0.50–1.10)
Sodium: 141 mEq/L (ref 135–145)
Total Bilirubin: 0.3 mg/dL (ref 0.3–1.2)

## 2012-08-30 LAB — OCCULT BLOOD, POC DEVICE: Fecal Occult Bld: NEGATIVE

## 2012-08-30 MED ORDER — ONDANSETRON HCL 4 MG/2ML IJ SOLN
4.0000 mg | Freq: Once | INTRAMUSCULAR | Status: AC
  Administered 2012-08-30: 4 mg via INTRAVENOUS

## 2012-08-30 MED ORDER — FENTANYL CITRATE 0.05 MG/ML IJ SOLN
50.0000 ug | Freq: Once | INTRAMUSCULAR | Status: AC
  Administered 2012-08-30: 50 ug via INTRAVENOUS

## 2012-08-30 MED ORDER — FENTANYL CITRATE 0.05 MG/ML IJ SOLN
INTRAMUSCULAR | Status: AC
  Administered 2012-08-30: 50 ug via INTRAVENOUS
  Filled 2012-08-30: qty 2

## 2012-08-30 MED ORDER — ONDANSETRON 4 MG PO TBDP: 8.0000 mg | ORAL_TABLET | Freq: Once | ORAL | Status: DC

## 2012-08-30 MED ORDER — ONDANSETRON HCL 4 MG/2ML IJ SOLN
INTRAMUSCULAR | Status: AC
  Filled 2012-08-30: qty 2

## 2012-08-30 NOTE — ED Notes (Signed)
Pt in bathroom

## 2012-08-30 NOTE — ED Provider Notes (Signed)
History     CSN: [id number]  Arrival date & time 08/30/12  2156   First MD Initiated Contact with Patient 08/30/12 2230      Chief Complaint  Patient presents with  . Diarrhea    (Consider location/radiation/quality/duration/timing/severity/associated sxs/prior treatment) HPI Comments: This is a 41 year old female, who presents to the emergency department with chief complaint of diarrhea x3-4 days. She also has associated nausea and vomiting. She recently had a knee replacement, and her surgeon put her on meloxicam, she is suspicious that this could be causing the diarrhea. Prior abdominal surgeries include appendectomy and cholecystectomy. She reports that she is having 3-4 loose, dark bowel movements per day. Additionally, she complains of not being able to urinate today. She is an 8/10 pain. She has not tried taking anything to alleviate her symptoms, and her symptoms do not radiate.  The history is provided by the patient. No language interpreter was used.    Past Medical History  Diagnosis Date  . Deep venous thrombosis of leg 2012    completed year of coumadin   . Hx of measles   . Migraines   . Monilia infection 12/31/10  . Vaginal atrophy 03/04/11  . Dyspareunia 03/04/11  . Bipolar 1 disorder     Followed by Mental Health.  All psychiatric medications prescribed by Mental Health.  Stable for many years.    . Hypothyroidism     Hypothyroidism since 8th grade.  Managed by Dr. Sharl Ma, Endocrinology.  On synthroid.  No prior thyroid surgery/ablation.     Past Surgical History  Procedure Date  . Replacement total knee 2001    right knee  . Cesarean section 1998  . Tonsillectomy   . Wisdom tooth extraction   . Cholecystectomy   . Left elbow   . Abdominal hysterectomy 2000  . Appendectomy     Family History  Problem Relation Age of Onset  . Cancer Maternal Aunt     History  Substance Use Topics  . Smoking status: Current Every Day Smoker -- 0.5 packs/day for 25 years      Types: Cigarettes  . Smokeless tobacco: Never Used  . Alcohol Use: No    OB History    Grav Para Term Preterm Abortions TAB SAB Ect Mult Living   1 1 1       1       Review of Systems  Constitutional: Negative for fever.  Gastrointestinal: Positive for nausea, vomiting, abdominal pain and diarrhea. Negative for constipation.  Genitourinary: Positive for dysuria.  All other systems reviewed and are negative.    Allergies  Codeine; Morphine and related; Naproxen; and Sulfonamide derivatives  Home Medications   Current Outpatient Rx  Name Route Sig Dispense Refill  . ARIPIPRAZOLE 5 MG PO TABS Oral Take 7.5 mg by mouth daily.    . BUPROPION HCL ER (XL) 300 MG PO TB24 Oral Take 300 mg by mouth daily.    Marland Kitchen CLONAZEPAM 0.5 MG PO TABS Oral Take 0.5 mg by mouth 2 (two) times daily. Scheduled. For anxiety    . DIVALPROEX SODIUM ER 500 MG PO TB24 Oral Take 1,000 mg by mouth at bedtime.    . OMEGA-3 FATTY ACIDS 1000 MG PO CAPS Oral Take 1 g by mouth daily.     Marland Kitchen GABAPENTIN 100 MG PO CAPS Oral Take 300 mg by mouth daily.    Marland Kitchen LEVOTHYROXINE SODIUM 88 MCG PO TABS Oral Take 88 mcg by mouth daily.    Marland Kitchen LIOTHYRONINE  SODIUM 5 MCG PO TABS Oral Take 5 mcg by mouth daily.    . MELOXICAM 7.5 MG PO TABS Oral Take 1 tablet (7.5 mg total) by mouth daily. 30 tablet 0  . NYSTATIN 100000 UNIT/GM EX POWD Topical Apply 100,000 g topically 2 (two) times daily as needed. For yeast. Apply under breast. 15 g 0  . SIMVASTATIN 40 MG PO TABS Oral Take 40 mg by mouth every morning.     . TRAZODONE HCL 100 MG PO TABS Oral Take 200 mg by mouth at bedtime.     Marland Kitchen VITAMIN D (ERGOCALCIFEROL) 50000 UNITS PO CAPS Oral Take 50,000 Units by mouth every 7 (seven) days. mondays      BP 111/78  Pulse 68  Temp 98.6 F (37 C) (Oral)  Resp 18  SpO2 98%  Physical Exam  Nursing note and vitals reviewed. Constitutional: She is oriented to person, place, and time. She appears well-developed and well-nourished.  HENT:   Head: Normocephalic and atraumatic.  Eyes: Conjunctivae normal and EOM are normal. Pupils are equal, round, and reactive to light.  Neck: Normal range of motion. Neck supple.  Cardiovascular: Normal rate, regular rhythm and normal heart sounds.  Exam reveals no gallop and no friction rub.   No murmur heard. Pulmonary/Chest: Effort normal and breath sounds normal. No respiratory distress. She has no wheezes. She has no rales. She exhibits no tenderness.  Abdominal: Soft.       Right lower quadrant tender to palpation, no masses felt on palpation, no guarding, or rebound tenderness, quiet bowel sounds heard on auscultation.  Musculoskeletal: Normal range of motion. She exhibits no edema and no tenderness.  Neurological: She is alert and oriented to person, place, and time.  Skin: Skin is warm and dry.  Psychiatric: She has a normal mood and affect. Her behavior is normal. Judgment and thought content normal.    ED Course  Procedures (including critical care time)  Labs Reviewed  CBC WITH DIFFERENTIAL - Abnormal; Notable for the following:    Neutrophils Relative 40 (*)     Lymphocytes Relative 51 (*)     Lymphs Abs 4.4 (*)     All other components within normal limits  COMPREHENSIVE METABOLIC PANEL - Abnormal; Notable for the following:    Glucose, Bld 103 (*)     Creatinine, Ser 1.12 (*)     Albumin 3.3 (*)     GFR calc non Af Amer 59 (*)     GFR calc Af Amer 69 (*)     All other components within normal limits  URINALYSIS, MICROSCOPIC ONLY   Results for orders placed during the hospital encounter of 08/30/12  URINALYSIS, MICROSCOPIC ONLY      Component Value Range   Color, Urine AMBER (*) YELLOW   APPearance CLOUDY (*) CLEAR   Specific Gravity, Urine 1.028  1.005 - 1.030   pH 7.5  5.0 - 8.0   Glucose, UA NEGATIVE  NEGATIVE mg/dL   Hgb urine dipstick NEGATIVE  NEGATIVE   Bilirubin Urine SMALL (*) NEGATIVE   Ketones, ur 15 (*) NEGATIVE mg/dL   Protein, ur NEGATIVE  NEGATIVE  mg/dL   Urobilinogen, UA 1.0  0.0 - 1.0 mg/dL   Nitrite NEGATIVE  NEGATIVE   Leukocytes, UA MODERATE (*) NEGATIVE   WBC, UA 11-20  <3 WBC/hpf   Bacteria, UA RARE  RARE   Squamous Epithelial / LPF FEW (*) RARE  CBC WITH DIFFERENTIAL      Component Value Range  WBC 8.6  4.0 - 10.5 K/uL   RBC 4.51  3.87 - 5.11 MIL/uL   Hemoglobin 13.8  12.0 - 15.0 g/dL   HCT 65.7  84.6 - 96.2 %   MCV 90.0  78.0 - 100.0 fL   MCH 30.6  26.0 - 34.0 pg   MCHC 34.0  30.0 - 36.0 g/dL   RDW 95.2  84.1 - 32.4 %   Platelets 154  150 - 400 K/uL   Neutrophils Relative 40 (*) 43 - 77 %   Neutro Abs 3.5  1.7 - 7.7 K/uL   Lymphocytes Relative 51 (*) 12 - 46 %   Lymphs Abs 4.4 (*) 0.7 - 4.0 K/uL   Monocytes Relative 7  3 - 12 %   Monocytes Absolute 0.6  0.1 - 1.0 K/uL   Eosinophils Relative 1  0 - 5 %   Eosinophils Absolute 0.1  0.0 - 0.7 K/uL   Basophils Relative 0  0 - 1 %   Basophils Absolute 0.0  0.0 - 0.1 K/uL  COMPREHENSIVE METABOLIC PANEL      Component Value Range   Sodium 141  135 - 145 mEq/L   Potassium 3.7  3.5 - 5.1 mEq/L   Chloride 103  96 - 112 mEq/L   CO2 28  19 - 32 mEq/L   Glucose, Bld 103 (*) 70 - 99 mg/dL   BUN 18  6 - 23 mg/dL   Creatinine, Ser 4.01 (*) 0.50 - 1.10 mg/dL   Calcium 9.4  8.4 - 02.7 mg/dL   Total Protein 6.3  6.0 - 8.3 g/dL   Albumin 3.3 (*) 3.5 - 5.2 g/dL   AST 13  0 - 37 U/L   ALT 10  0 - 35 U/L   Alkaline Phosphatase 47  39 - 117 U/L   Total Bilirubin 0.3  0.3 - 1.2 mg/dL   GFR calc non Af Amer 59 (*) >90 mL/min   GFR calc Af Amer 69 (*) >90 mL/min  OCCULT BLOOD, POC DEVICE      Component Value Range   Fecal Occult Bld NEGATIVE     Dg Knee Complete 4 Views Right  08/14/2012  *RADIOLOGY REPORT*  Clinical Data: Pain post trauma  RIGHT KNEE - COMPLETE 4+ VIEW  Comparison: April 05, 2011.  Findings:  Frontal, lateral, bilateral oblique views were obtained. There is a total knee replacement with femoral and tibial components appearing well seated.  No fracture or  dislocation.  No effusion.  Calcification is noted in the suprapatellar bursa, a stable finding.  IMPRESSION: Prosthetic components appear well seated.  No fracture or dislocation.  Stable calcification in the suprapatellar bursa, a finding probably secondary to previous arthropathy.   Original Report Authenticated By: Arvin Collard. Margarita Grizzle III, M.D.    Dg Abd 2 Views  08/31/2012  *RADIOLOGY REPORT*  Clinical Data: Abdominal pain for 5 days.  Diarrhea.  ABDOMEN - 2 VIEW  Comparison: CT abdomen and pelvis 09/15/2009 and plain film of the abdomen 02/04/2010.  Findings: There is no free intraperitoneal air.  The bowel gas pattern is normal.  Surgical clips on the right are noted.  IMPRESSION: No acute finding.   Original Report Authenticated By: Holley Dexter, M.D.        1. UTI (lower urinary tract infection)       MDM  41 year old with diarrhea x 3 days. Pain in the ED has been managed with fentanyl.  I have discussed this patient with Dr.  Palumbo, who agrees with the plan.  I am going to order an abdominal series to rule out free air, and get a FOBT.  If these tests are negative, I am going to send the patient home and have her follow-up with her primary care provider.  12:28 AM Patient has leuks and some dysuria from earlier today.  I am going to send the patient home with Macrobid.         Roxy Horseman, PA-C 08/31/12 [phone]

## 2012-08-30 NOTE — ED Notes (Addendum)
Pt st's she started having diarrhea three days ago after being placed on Mobic.  Pt also st's she has 8/10 RLQ pain, pt has had appendectomy.  Pt st's she hasn't been eating or drinking over the last 3 days, hasn't had an appetite.  No vomiting, some nausea.  Pt has hx of diverticulitis, no hx of ulcers, pt doesn't take NSAIDS.

## 2012-08-30 NOTE — ED Notes (Signed)
Pt notified that we need urine sample.  St's she doesn't have to go at right now, will try again soon.

## 2012-08-30 NOTE — ED Notes (Signed)
Reports 3 days of diarrhea black in color associated with right sided lower abdominal pain. Pt states, "i only urinated once this am and it was a little bit" c/o nausea.

## 2012-08-31 ENCOUNTER — Emergency Department (HOSPITAL_COMMUNITY): Payer: Medicaid Other

## 2012-08-31 MED ORDER — FENTANYL CITRATE 0.05 MG/ML IJ SOLN
50.0000 ug | Freq: Once | INTRAMUSCULAR | Status: AC
  Administered 2012-08-31: 50 ug via INTRAVENOUS

## 2012-08-31 MED ORDER — FENTANYL CITRATE 0.05 MG/ML IJ SOLN
INTRAMUSCULAR | Status: AC
  Administered 2012-08-31: 50 ug via INTRAVENOUS
  Filled 2012-08-31: qty 2

## 2012-08-31 MED ORDER — NITROFURANTOIN MONOHYD MACRO 100 MG PO CAPS: 100.0000 mg | ORAL_CAPSULE | Freq: Two times a day (BID) | ORAL | Status: DC

## 2012-08-31 NOTE — ED Provider Notes (Signed)
Medical screening examination/treatment/procedure(s) were performed by non-physician practitioner and as supervising physician I was immediately available for consultation/collaboration.  Nicki Furlan Smitty Cords, MD 08/31/12 0040

## 2012-09-01 LAB — URINE CULTURE
Colony Count: NO GROWTH
Culture: NO GROWTH

## 2012-09-03 ENCOUNTER — Telehealth: Payer: Self-pay | Admitting: *Deleted

## 2012-09-03 NOTE — Telephone Encounter (Signed)
Pt called asking for an appointment.  Seen in ED 10/31  I returned call and no answer.  Message left to call clinic

## 2012-09-04 ENCOUNTER — Ambulatory Visit: Payer: Medicaid Other | Admitting: Internal Medicine

## 2012-09-11 ENCOUNTER — Telehealth: Payer: Self-pay | Admitting: *Deleted

## 2012-09-11 NOTE — Telephone Encounter (Signed)
Agree with appt.  Thanks, Kaveh Kissinger 

## 2012-09-11 NOTE — Telephone Encounter (Signed)
Pt states she went to ED for uti and she is cont to have lower abd cramping and is "wetting myself" she is offered an appt 11/13 but refuses due to transportation issues, appt is set for 11/14 at 0945 w/ dr Dorthula Rue per doriss.

## 2012-09-12 ENCOUNTER — Ambulatory Visit: Payer: Medicaid Other | Admitting: Internal Medicine

## 2012-09-13 ENCOUNTER — Ambulatory Visit: Payer: Medicaid Other | Admitting: Internal Medicine

## 2012-10-02 ENCOUNTER — Telehealth: Payer: Self-pay | Admitting: Obstetrics and Gynecology

## 2012-10-02 ENCOUNTER — Ambulatory Visit: Payer: Medicaid Other | Admitting: Obstetrics and Gynecology

## 2012-10-03 ENCOUNTER — Telehealth: Payer: Self-pay | Admitting: Obstetrics and Gynecology

## 2012-10-03 NOTE — Telephone Encounter (Signed)
TC TO PT REGARDING MESSAGE AND HER MOTHER STATES SHE DO NOT LIVE THERE ANYMORE. HER MOTHER STATES THAT SHE WILL GIVE PT MESSAGE THAT WE CALLED.

## 2012-10-03 NOTE — Telephone Encounter (Signed)
VM from pt. Questioning if needs RF Vit D. [phone]

## 2012-10-09 ENCOUNTER — Encounter: Payer: Self-pay | Admitting: Internal Medicine

## 2012-10-09 ENCOUNTER — Ambulatory Visit (INDEPENDENT_AMBULATORY_CARE_PROVIDER_SITE_OTHER): Payer: Medicaid Other | Admitting: Internal Medicine

## 2012-10-09 VITALS — BP 112/78 | HR 80 | Temp 97.0°F | Wt 198.2 lb

## 2012-10-09 DIAGNOSIS — J329 Chronic sinusitis, unspecified: Secondary | ICD-10-CM | POA: Insufficient documentation

## 2012-10-09 DIAGNOSIS — Z720 Tobacco use: Secondary | ICD-10-CM

## 2012-10-09 DIAGNOSIS — J019 Acute sinusitis, unspecified: Secondary | ICD-10-CM

## 2012-10-09 DIAGNOSIS — F17200 Nicotine dependence, unspecified, uncomplicated: Secondary | ICD-10-CM

## 2012-10-09 DIAGNOSIS — I739 Peripheral vascular disease, unspecified: Secondary | ICD-10-CM | POA: Insufficient documentation

## 2012-10-09 MED ORDER — FLUTICASONE PROPIONATE 50 MCG/ACT NA SUSP: 2.0000 | Freq: Every day | NASAL | Status: DC

## 2012-10-09 MED ORDER — GLUCOSE BLOOD VI STRP: ORAL_STRIP | Status: DC

## 2012-10-09 MED ORDER — ACETAMINOPHEN 325 MG PO TABS: 325.0000 mg | ORAL_TABLET | ORAL | Status: DC | PRN

## 2012-10-09 NOTE — Progress Notes (Signed)
Patient: [name]   MRN: [id number]  DOB: 1971-10-02    Subjective:    HPI: Ms. CALLY NYGARD is a 41 y.o. female with a PMHx of bipolar disorder (followed at mental health), hypothyroidism (followed by Dr. Sharl Ma), history of provoked DVT in 2012 (post-operative, with prior 1 year tx with coumadin) who presented to clinic today for the following:  1) Sinus pressure - 4-5 history of green nasal congestion, postnasal drip, in bitemporal and maxillary regions, left ear pain. Denies sore throat. Confirms productive cough with green sputum production, no fevers, confirms feeling chilly, but no rigors. Confirms pounding temporal headache is also associated. Denies associated loss of vision, vision changes. No new focal neurologic deficits including   2) Right calf pain - patient indicates several month history of right calf pain, that is worse with ambulation described as an achy pain. The pain is somewhat improved with rest. This is the lower extremity in which she previously had a DVT. She was treated with 1 year course of Coumadin (I cannot find documentation of this diagnosis or treatment). She denies recurrent redness, warmth, asymmetric edema of the lower extremities. She is an ongoing smoker, and has noted coldness to the right foot, without overt color change or ulcer formation.   Review of Systems: Per HPI.   Current Outpatient Medications: Medication Sig  . ARIPiprazole (ABILIFY) 5 MG tablet Take 10 mg by mouth daily.   Marland Kitchen buPROPion (WELLBUTRIN XL) 300 MG 24 hr tablet Take 300 mg by mouth daily.  . clonazePAM (KLONOPIN) 0.5 MG tablet Take 0.5 mg by mouth 2 (two) times daily. Scheduled. For anxiety  . divalproex (DEPAKOTE ER) 500 MG 24 hr tablet Take 1,000 mg by mouth at bedtime.  . fish oil-omega-3 fatty acids 1000 MG capsule Take 1 g by mouth daily.   Marland Kitchen gabapentin (NEURONTIN) 100 MG capsule Take 300 mg by mouth daily.  Marland Kitchen levothyroxine (SYNTHROID, LEVOTHROID) 88 MCG tablet  Take 88 mcg by mouth daily.  Marland Kitchen liothyronine (CYTOMEL) 5 MCG tablet Take 5 mcg by mouth daily.  . simvastatin (ZOCOR) 40 MG tablet Take 40 mg by mouth every morning.   . traZODone (DESYREL) 100 MG tablet Take 200 mg by mouth at bedtime.   . Vitamin D, Ergocalciferol, (DRISDOL) 50000 UNITS CAPS Take 50,000 Units by mouth every 7 (seven) days. mondays     Allergies  Allergen Reactions  . Codeine     Nausea and vomiting  . Meloxicam Nausea Only  . Morphine And Related     Makes me loopy and hallucinates  . Naproxen     Rash and nausea  . Sulfonamide Derivatives     Nausea and rash    Past Medical History  Diagnosis Date  . Deep venous thrombosis of leg 2012    completed year of coumadin   . Hx of measles   . Migraines   . Monilia infection 12/31/10  . Vaginal atrophy 03/04/11  . Dyspareunia 03/04/11  . Bipolar 1 disorder     Followed by Mental Health.  All psychiatric medications prescribed by Mental Health.  Stable for many years.    . Hypothyroidism     Hypothyroidism since 8th grade.  Managed by Dr. Sharl Ma, Endocrinology.  On synthroid.  No prior thyroid surgery/ablation.     Past Surgical History  Procedure Date  . Replacement total knee 2001    right knee  . Cesarean section 1998  . Tonsillectomy   . Wisdom tooth extraction   .  Cholecystectomy   . Left elbow   . Abdominal hysterectomy 2000  . Appendectomy      Objective:    Physical Exam: Filed Vitals:   10/09/12 1458  BP: 112/78  Pulse: 80  Temp: 97 F (36.1 C)     General Exam:   Head: Normocephalic, atraumatic. Tenderness to palpation of the maxillary and temporal regions bilaterally.   Eyes: No signs of anemia or jaundince.  Ears: Bilateral TM nonerythematous, no bulging of the membrane.   Nose: Mucous membranes moist, not inflammed, nonerythematous.  Throat: Oropharynx nonerythematous, no exudate appreciated.   Neck: No deformities, masses, or tenderness noted. Supple.  Lungs:  Normal respiratory  effort. Clear to auscultation BL without crackles or wheezes.  Heart: RRR. S1 and S2 normal without gallop, murmur, or rubs.  Abdomen:  BS normoactive. Soft, Nondistended, non-tender.  No masses or organomegaly.  Extremities: No pretibial edema.  Right lower extremity - no asymmetric edema noted, no asymmetric fullness to the right calf as compared to the left. Significant tenderness to palpation of the right calf as compared to the left. However, there is no overlying redness, warmth, edema of the calf or surrounding area. There are no lesions noted. Distal pulses in bilateral lower extremities seem mildly diminished. Right foot slightly colder than the left. Normal capillary refill. Normal coloration to the skin, without overt cyanosis noted.   Skin: No visible rashes, scars.     Neurologic Exam:   Mental Status: Alert, oriented, thought content appropriate.  Speech fluent without evidence of aphasia. Able to follow 3 step commands without difficulty.  Cranial Nerves:   II: Visual fields grossly intact.  III/IV/VI: Extraocular movements intact.  Pupils reactive bilaterally.  V/VII: Smile symmetric. facial light touch sensation normal bilaterally.  VIII: Grossly intact.  IX/X: Normal gag.  XI: Bilateral shoulder shrug normal.  XII: Midline tongue extension normal.  Motor:  5/5 bilaterally with normal tone and bulk  Sensory:  Light touch intact throughout, bilaterally       Assessment/ Plan:   Case and plan of care discussed with attending physician, Dr. Aletta Edouard.

## 2012-10-09 NOTE — Assessment & Plan Note (Addendum)
  Assessment: 1. The patient was counseled on the dangers of tobacco use, which include, but are not limited to cardiovascular disease, increased cancer risk of multiple types of cancer, COPD, peripheral vascular disease, strokes. 2. She was also counseled on the benefits of smoking cessation. 3. Progress toward smoking cessation:   unchanged 4. Barriers to progress toward smoking cessation:  smoking the same amount  Plan: 1. Instruction/counseling given: The patient was firmly advised to quit and referred to a tobacco cessation program.   2. We also reviewed strategies to maximize success, including:  Removing cigarettes and smoking materials from environment  Stress management  Substitution of other forms of reinforcement Support of family/friends.  Selecting a quit date. 3. Educational resources provided: [facility] (1-800-QUIT-NOW) brochure 4. Self management tools provided:   5. Medications to assist with smoking cessation: None 6. Patient agreed to the following self-care plans for smoking cessation: none

## 2012-10-09 NOTE — Patient Instructions (Addendum)
General Instructions:  Please follow-up at the clinic in 1 month with your PCP, at which time we will reevaluate your leg pain - OR, please follow-up in the clinic sooner if needed.  There have been changes in your medications:  START flonase twice daily for your nasal congestion  START as needed tylenol for your headache.   Call the clinic if you are having worsening symptoms after 5-7 days.  If you have been started on new medication(s), and you develop symptoms concerning for allergic reaction, including, but not limited to, throat closing, tongue swelling, rash, please stop the medication immediately and call the clinic at [phone], and go to the ER.  If symptoms worsen, or new symptoms arise, please call the clinic or go to the ER.  PLEASE BRING ALL OF YOUR MEDICATIONS  IN A BAG TO YOUR NEXT APPOINTMENT   Treatment Goals:  Goals (1 Years of Data) as of 10/09/2012    None      Progress Toward Treatment Goals:    Self Care Goals & Plans:  Self Care Goal 10/09/2012  Manage my medications take my medicines as prescribed; bring my medications to every visit  Monitor my health keep track of my blood glucose  Eat healthy foods eat smaller portions; eat foods that are low in salt; eat baked foods instead of fried foods       Care Management & Community Referrals:         Sinusitis Sinusitis is redness, soreness, and swelling (inflammation) of the paranasal sinuses. Paranasal sinuses are air pockets within the bones of your face (beneath the eyes, the middle of the forehead, or above the eyes). In healthy paranasal sinuses, mucus is able to drain out, and air is able to circulate through them by way of your nose. However, when your paranasal sinuses are inflamed, mucus and air can become trapped. This can allow bacteria and other germs to grow and cause infection. Sinusitis can develop quickly and last only a short time (acute) or continue over a long period (chronic).  Sinusitis that lasts for more than 12 weeks is considered chronic.   CAUSES   Causes of sinusitis include:  Allergies.   Structural abnormalities, such as displacement of the cartilage that separates your nostrils (deviated septum), which can decrease the air flow through your nose and sinuses and affect sinus drainage.   Functional abnormalities, such as when the small hairs (cilia) that line your sinuses and help remove mucus do not work properly or are not present.  SYMPTOMS   Symptoms of acute and chronic sinusitis are the same. The primary symptoms are pain and pressure around the affected sinuses. Other symptoms include:  Upper toothache.   Earache.   Headache.   Bad breath.   Decreased sense of smell and taste.   A cough, which worsens when you are lying flat.   Fatigue.   Fever.   Thick drainage from your nose, which often is green and may contain pus (purulent).   Swelling and warmth over the affected sinuses.  DIAGNOSIS   Your caregiver will perform a physical exam. During the exam, your caregiver may:  Look in your nose for signs of abnormal growths in your nostrils (nasal polyps).   Tap over the affected sinus to check for signs of infection.   View the inside of your sinuses (endoscopy) with a special imaging device with a light attached (endoscope), which is inserted into your sinuses.  If your caregiver suspects that you  have chronic sinusitis, one or more of the following tests may be recommended:  Allergy tests.   Nasal culture A sample of mucus is taken from your nose and sent to a lab and screened for bacteria.   Nasal cytology A sample of mucus is taken from your nose and examined by your caregiver to determine if your sinusitis is related to an allergy.  TREATMENT   Most cases of acute sinusitis are related to a viral infection and will resolve on their own within 10 days. Sometimes medicines are prescribed to help relieve symptoms (pain medicine,  decongestants, nasal steroid sprays, or saline sprays).   However, for sinusitis related to a bacterial infection, your caregiver will prescribe antibiotic medicines. These are medicines that will help kill the bacteria causing the infection.   Rarely, sinusitis is caused by a fungal infection. In theses cases, your caregiver will prescribe antifungal medicine. For some cases of chronic sinusitis, surgery is needed. Generally, these are cases in which sinusitis recurs more than 3 times per year, despite other treatments. HOME CARE INSTRUCTIONS    Drink plenty of water. Water helps thin the mucus so your sinuses can drain more easily.   Use a humidifier.   Inhale steam 3 to 4 times a day (for example, sit in the bathroom with the shower running).   Apply a warm, moist washcloth to your face 3 to 4 times a day, or as directed by your caregiver.   Use saline nasal sprays to help moisten and clean your sinuses.   Take over-the-counter or prescription medicines for pain, discomfort, or fever only as directed by your caregiver.  SEEK IMMEDIATE MEDICAL CARE IF:  You have increasing pain or severe headaches.   You have nausea, vomiting, or drowsiness.   You have swelling around your face.   You have vision problems.   You have a stiff neck.   You have difficulty breathing.  MAKE SURE YOU:    Understand these instructions.   Will watch your condition.   Will get help right away if you are not doing well or get worse.  Document Released: 10/17/2005 Document Revised: 01/09/2012 Document Reviewed: 11/01/2011 [facility] Patient Information 2013 [place], [place].

## 2012-10-09 NOTE — Assessment & Plan Note (Signed)
Pertinent Data:  Left lower extremity doppler ultrasound (12/2011) - No evidence of deep or superficial vein thrombosis involving the right lower extremity. The peroneal vein thrombosis noted in 09/2010 appears to have resolved.   Assessment: Patient was initially concerned about recurrent DVT. As mentioned in my note, her last DVT in 2012 was provoked in the setting of orthopedic surgery. At this time, she does not have asymmetric swelling, redness, warmth, edema to suggest acute DVT. As well, her well score for DVT is -1 only contributed by her remote history of DVT. Therefore, DVT is very low in the differential diagnosis. However, I am concerned that she has mildly diminished pulses and significant tobacco abuse history, in the setting of cool her lower extremities. Therefore, I am concerned that the symptoms that she describes may be consistent with claudication. No active ulceration or cyanosis of the feet is noted.  Plan:      Will order ABIs to further evaluate.   Patient is counseled of red flag symptoms to prompt immediate reevaluation.

## 2012-10-09 NOTE — Assessment & Plan Note (Signed)
Pertinent Data:  CT head without contrast (04/2012) - Cerebellar volume loss. There is no evidence for acute  hemorrhage, hydrocephalus, mass lesion, or abnormal extra-axia fluid collection. No definite CT evidence for acute infarction. The visualized paranasal sinuses and mastoid air cells are  predominately clear. No displaced calvarial fracture.   Assessment: Likely secondary to viral sinusitis, however given that the symptoms just started 4 or 5 days ago, it is really difficult to discern if viral or bacterial component more prominent. She does not have significant fevers, or purulent discharge is appreciated on exam. Therefore, I do not feel that antibiotics are indicated at this time. In regards to her sinus headache, Tylenol should be sufficient in treating. Of note, there are no neurologic deficits noted on exam.  Plan:      Will add Flonase nasal spray to help with nasal congestion.  Advised to return to clinic within a week if she's not having improvement of symptoms by that time, at which time we'll consider if antibiotics are warranted.   Of note, CT scan in July 2013 did not indicate any evidence of chronic sinusitis.

## 2012-10-11 ENCOUNTER — Telehealth: Payer: Self-pay | Admitting: Internal Medicine

## 2012-10-11 NOTE — Telephone Encounter (Signed)
Patient called stating that she still has a headache (Tylenol is not working) and her sinus is not improving.  She was seen by Dr. Thad Ranger on 12/10 and was prescribed Flonase.  She states that Flonase is not helping her and I explained to her that it takes time for Flonase to work. She wants stronger pain med and abx. Per Dr. Thad Ranger' note, patient was instructed to return to clinic in 1 week if her sx are not better and abx may be considered.  I advised patient to either go to the ED or return to clinic if her symptoms worsen.

## 2012-10-12 ENCOUNTER — Telehealth: Payer: Self-pay | Admitting: *Deleted

## 2012-10-12 MED ORDER — TRAMADOL HCL 50 MG PO TABS: 50.0000 mg | ORAL_TABLET | Freq: Three times a day (TID) | ORAL | Status: DC | PRN

## 2012-10-12 NOTE — Telephone Encounter (Signed)
Spoke with pt and explained why I am not prescribing antibiotics yet given sx for only a few days. I told her I will not prescribe narcotics, and she agrees. I am going to give short course of tramadol. She will be following up with Dr. Manson Passey next week, at which time, he can evaluate that if sx persistent, can consider Abx if clinically indicated.   Johnette Abraham, D.O., 10/12/2012, 3:46 PM

## 2012-10-12 NOTE — Telephone Encounter (Signed)
Pt called stating she was seen in clinic on 12/10 and was given a nasal steroid. She states her head is stilling killing her, she has taken tylenol until her liver will burst. She is not better and would like medication called in for her.  She can not come into the  Clinic  Pt # [phone] Please call this pt, she called in last night and spoke with Dr Anselm Jungling

## 2012-10-29 ENCOUNTER — Telehealth: Payer: Self-pay | Admitting: *Deleted

## 2012-10-29 NOTE — Telephone Encounter (Signed)
Pt called with c/o yellow deposits in corner of left  eye.  Seen by eye MD last week and was given lubricating drops and told not to use anything else in her eyes. I advised her to call her eye doctor for advise and possible antibiotic drops.   Also told her to call us back if she can not get in touch with him.  We would need to see her before giving her medication. Pt # [phone]

## 2012-10-29 NOTE — Telephone Encounter (Signed)
Agree with plan.  [name]

## 2012-11-14 ENCOUNTER — Encounter: Payer: Medicaid Other | Admitting: Internal Medicine

## 2012-11-22 ENCOUNTER — Telehealth: Payer: Self-pay | Admitting: Internal Medicine

## 2012-11-22 ENCOUNTER — Emergency Department (INDEPENDENT_AMBULATORY_CARE_PROVIDER_SITE_OTHER)
Admission: EM | Admit: 2012-11-22 | Discharge: 2012-11-22 | Disposition: A | Discharge status: Home / Self Care | Payer: Medicaid Other | Source: Home / Self Care | Attending: Family Medicine | Admitting: Family Medicine

## 2012-11-22 ENCOUNTER — Telehealth: Payer: Self-pay | Admitting: *Deleted

## 2012-11-22 ENCOUNTER — Encounter (HOSPITAL_COMMUNITY): Payer: Self-pay | Admitting: Emergency Medicine

## 2012-11-22 DIAGNOSIS — J069 Acute upper respiratory infection, unspecified: Secondary | ICD-10-CM

## 2012-11-22 MED ORDER — GI COCKTAIL ~~LOC~~
30.0000 mL | Freq: Once | ORAL | Status: AC
  Administered 2012-11-22: 30 mL via ORAL

## 2012-11-22 MED ORDER — ONDANSETRON HCL 4 MG PO TABS: 4.0000 mg | ORAL_TABLET | Freq: Four times a day (QID) | ORAL | Status: DC

## 2012-11-22 MED ORDER — ONDANSETRON 4 MG PO TBDP
ORAL_TABLET | ORAL | Status: AC
  Filled 2012-11-22: qty 1

## 2012-11-22 MED ORDER — DEXTROMETHORPHAN POLISTIREX 30 MG/5ML PO LQCR: 60.0000 mg | Freq: Two times a day (BID) | ORAL | Status: DC

## 2012-11-22 MED ORDER — GI COCKTAIL ~~LOC~~
ORAL | Status: AC
  Filled 2012-11-22: qty 30

## 2012-11-22 MED ORDER — ONDANSETRON 4 MG PO TBDP
4.0000 mg | ORAL_TABLET | Freq: Once | ORAL | Status: AC
  Administered 2012-11-22: 4 mg via ORAL

## 2012-11-22 NOTE — Telephone Encounter (Signed)
Agree. She should go to urgent care. Thanks, Annalyce Lanpher.

## 2012-11-22 NOTE — Telephone Encounter (Signed)
INTERNAL MEDICINE RESIDENCY PROGRAM After-Hours Telephone Call    Reason for call:   I received a call from [name] on 11/22/2012 at 0910 pm.  Patient reported that she went to urgent care for upper respiration infection today, but she stated, " I forgot to tell the urgent care doctor about my headache."  Patient states that she has " really bad headache".  Feel like pressure headache " on the front and side".  Denies vision change or blurry vision, numbness, tingling or weakness.  Denies nausea or vomiting.  She requested a new prescription tramadol for a headache.  She refused to take Tylenol because " I took too many Tylenols and it hurt my liver".    Pertinent Data:   Per chart review, patient called the clinic this morning complaining flu-like symptoms for 3 days.  Denies fever, nausea or vomiting.  Did not take her temperature at home.  She was offered an appointment today but refused it due to transportation.  She was advised to go to urgent care.  Per urgent care chart review, she was diagnosed with upper respiratory infection and was given GI cocktail and the Zofran, and she was prescribed with dextromethorphan and Zofran before she was discharged from the urgent care.    Per chart review, she has had very similar nighttime phone calls in December requesting pain medications.     Assessment / Plan / Recommendations:    I instruct patient that I am unable to prescribe her with any pain medications without examining her.  Patient is encouraged to go to a 24-hour urgent care or emergency room for the evaluation of her headache tonight.  Patient is instructed to call the clinic in the morning to talk to triage nurse and schedule an appointment for a headache evaluation as well  Patient agreed with above plan  As always, pt is advised that if symptoms worsen or new symptoms arise, they should go to an urgent care facility or to to ER for further evaluation.    Dede Query, MD     11/22/2012, 9:46 PM

## 2012-11-22 NOTE — Telephone Encounter (Signed)
Pt calls c/o flu like symptoms x 3 days, achy, congested, green, yellow, blood- tinged nasal mucous, hard cough with yellow green sputum. Denies fevers, nausea and vomiting does admit to not checking temp, does state she is having hot/ chill events. She is offered an appt today but refuses due to transportation. She is advised to go to urg care, she is agreeable

## 2012-11-22 NOTE — ED Notes (Signed)
Pt c/o cold sx x3 days Sx include: frontal headache, sinus pressure, nasal congestion, runny nose w/green mucous, cough, fever of 101 today, nauseas, abd pain Denies: vomiting, diarrhea  She is alert w/no signs of acute distress.

## 2012-11-22 NOTE — ED Provider Notes (Signed)
History     CSN: [id number]  Arrival date & time 11/22/12  1736   First MD Initiated Contact with Patient 11/22/12 1745      Chief Complaint  Patient presents with  . URI    (Consider location/radiation/quality/duration/timing/severity/associated sxs/prior treatment) Patient is a 42 y.o. female presenting with URI. The history is provided by the patient.  URI The primary symptoms include cough and abdominal pain. Primary symptoms do not include fever, fatigue, sore throat, nausea, vomiting or myalgias. Primary symptoms comment: has been in court twice this week so stress is an issue. The current episode started 3 to 5 days ago. This is a new problem. The problem has not changed since onset. The onset of the illness is associated with exposure to sick contacts. Symptoms associated with the illness include congestion and rhinorrhea. Risk factors: smoker.    Past Medical History  Diagnosis Date  . Deep venous thrombosis of leg 2012    completed year of coumadin   . Hx of measles   . Migraines   . Monilia infection 12/31/10  . Vaginal atrophy 03/04/11  . Dyspareunia 03/04/11  . Bipolar 1 disorder     Followed by Mental Health.  All psychiatric medications prescribed by Mental Health.  Stable for many years.    . Hypothyroidism     Hypothyroidism since 8th grade.  Managed by Dr. Sharl Ma, Endocrinology.  On synthroid.  No prior thyroid surgery/ablation.     Past Surgical History  Procedure Date  . Replacement total knee 2001    right knee  . Cesarean section 1998  . Tonsillectomy   . Wisdom tooth extraction   . Cholecystectomy   . Left elbow   . Abdominal hysterectomy 2000  . Appendectomy     Family History  Problem Relation Age of Onset  . Cancer Maternal Aunt     History  Substance Use Topics  . Smoking status: Current Every Day Smoker -- 0.5 packs/day for 25 years    Types: Cigarettes  . Smokeless tobacco: Never Used  . Alcohol Use: No    OB History    Grav Para  Term Preterm Abortions TAB SAB Ect Mult Living   1 1 1       1       Review of Systems  Constitutional: Negative.  Negative for fever and fatigue.  HENT: Positive for congestion and rhinorrhea. Negative for sore throat.   Respiratory: Positive for cough.   Gastrointestinal: Positive for abdominal pain. Negative for nausea, vomiting, diarrhea and constipation.  Genitourinary: Negative.   Musculoskeletal: Negative for myalgias.    Allergies  Codeine; Meloxicam; Morphine and related; Naproxen; and Sulfonamide derivatives  Home Medications   Current Outpatient Rx  Name  Route  Sig  Dispense  Refill  . ACETAMINOPHEN 325 MG PO TABS   Oral   Take 1 tablet (325 mg total) by mouth every 4 (four) hours as needed for pain.   100 tablet   2   . ARIPIPRAZOLE 5 MG PO TABS   Oral   Take 10 mg by mouth daily.          . BUPROPION HCL ER (XL) 300 MG PO TB24   Oral   Take 300 mg by mouth daily.         Marland Kitchen CLONAZEPAM 0.5 MG PO TABS   Oral   Take 0.5 mg by mouth 2 (two) times daily. Scheduled. For anxiety         . DEXTROMETHORPHAN  POLISTIREX ER 30 MG/5ML PO LQCR   Oral   Take 10 mLs (60 mg total) by mouth 2 (two) times daily.   89 mL   0   . DIVALPROEX SODIUM ER 500 MG PO TB24   Oral   Take 1,000 mg by mouth at bedtime.         . OMEGA-3 FATTY ACIDS 1000 MG PO CAPS   Oral   Take 1 g by mouth daily.          Marland Kitchen FLUTICASONE PROPIONATE 50 MCG/ACT NA SUSP   Nasal   Place 2 sprays into the nose daily. Dispense 1 bottle   16 g   1   . GABAPENTIN 100 MG PO CAPS   Oral   Take 300 mg by mouth daily.         Marland Kitchen GLUCOSE BLOOD VI STRP      Use as instructed   100 each   12   . LEVOTHYROXINE SODIUM 88 MCG PO TABS   Oral   Take 88 mcg by mouth daily.         Marland Kitchen LIOTHYRONINE SODIUM 5 MCG PO TABS   Oral   Take 5 mcg by mouth daily.         Marland Kitchen ONDANSETRON HCL 4 MG PO TABS   Oral   Take 1 tablet (4 mg total) by mouth every 6 (six) hours. As needed for n/v   8  tablet   0   . SIMVASTATIN 40 MG PO TABS   Oral   Take 40 mg by mouth every morning.          Marland Kitchen TRAMADOL HCL 50 MG PO TABS   Oral   Take 1 tablet (50 mg total) by mouth every 8 (eight) hours as needed for pain.   20 tablet   0   . TRAZODONE HCL 100 MG PO TABS   Oral   Take 200 mg by mouth at bedtime.          Marland Kitchen VITAMIN D (ERGOCALCIFEROL) 50000 UNITS PO CAPS   Oral   Take 50,000 Units by mouth every 7 (seven) days. mondays           BP 119/92  Pulse 81  Temp 99 F (37.2 C) (Oral)  Resp 16  SpO2 100%  Physical Exam  Nursing note and vitals reviewed. Constitutional: She is oriented to person, place, and time. She appears well-developed and well-nourished. No distress.  HENT:  Head: Normocephalic.  Right Ear: External ear normal.  Nose: Nose normal.  Mouth/Throat: Oropharynx is clear and moist.  Eyes: Conjunctivae normal are normal. Pupils are equal, round, and reactive to light.  Neck: Normal range of motion. Neck supple.  Cardiovascular: Normal rate, regular rhythm, normal heart sounds and intact distal pulses.   Pulmonary/Chest: Effort normal and breath sounds normal.  Abdominal: Soft. Normal appearance and bowel sounds are normal. She exhibits no distension and no mass. There is no hepatosplenomegaly. There is tenderness in the epigastric area. There is no rebound, no guarding and no CVA tenderness.    Lymphadenopathy:    She has no cervical adenopathy.  Neurological: She is alert and oriented to person, place, and time.  Skin: Skin is warm and dry.    ED Course  Procedures (including critical care time)  Labs Reviewed - No data to display No results found.   1. URI (upper respiratory infection)       MDM  Linna Hoff, MD 11/22/12 [phone]

## 2012-12-03 ENCOUNTER — Ambulatory Visit: Payer: Medicaid Other | Admitting: Obstetrics and Gynecology

## 2012-12-05 ENCOUNTER — Telehealth: Payer: Self-pay | Admitting: *Deleted

## 2012-12-05 ENCOUNTER — Encounter: Payer: Medicaid Other | Admitting: Internal Medicine

## 2012-12-05 MED ORDER — TRAMADOL HCL 50 MG PO TABS: 50.0000 mg | ORAL_TABLET | Freq: Three times a day (TID) | ORAL | Status: DC | PRN

## 2012-12-05 NOTE — Telephone Encounter (Signed)
Pt called asking to cancel her appointment for today as she feels to sick to come in. She states she has had the flu for 3 weeks.  She has runny nose, low grade fever. Cough on and off. Pt had flu shot one month ago and has been sick since then. She has tried nasal spray as needed.  Please call Shyanne this afternoon @ [phone]

## 2012-12-05 NOTE — Telephone Encounter (Signed)
I returned the patient's phone call.  The patient notes a 3-week history of symptoms or nasal congestion, runny nose, non-productive cough, and subjective fevers.  No sore throat.  Symptoms started after getting a flu shot.  The patient decided to seek care at Urgent Care, and was reportedly diagnosed with a URI, and prescribed a lidocaine-containing cough syrup.  She had an appointment with me today, but cancelled because she felt "too sick" to come in.  I advised the patient that she needed to be examined if she felt as bad as she described.  The patient requested an appointment with me next week, 2/12, which I will schedule.  I informed the patient to seek care earlier, either with another provider in our clinic or at an urgent care/ED if symptoms worsen between now and then.  Patient expressed understanding.  Patient requested a refill of tramadol for what she describes as "rib pain" from coughing (c/w costochondritis).  Request granted.  Awanda Mink 12/05/2012, 5:40 PM

## 2012-12-12 ENCOUNTER — Encounter: Payer: Medicaid Other | Admitting: Internal Medicine

## 2012-12-17 ENCOUNTER — Emergency Department (HOSPITAL_COMMUNITY)
Admission: EM | Admit: 2012-12-17 | Discharge: 2012-12-17 | Disposition: A | Discharge status: Home / Self Care | Payer: Medicaid Other | Source: Home / Self Care | Attending: Emergency Medicine | Admitting: Emergency Medicine

## 2012-12-17 ENCOUNTER — Encounter (HOSPITAL_COMMUNITY): Payer: Self-pay | Admitting: Emergency Medicine

## 2012-12-17 DIAGNOSIS — J069 Acute upper respiratory infection, unspecified: Secondary | ICD-10-CM

## 2012-12-17 MED ORDER — DIPHENHYD-HYDROCORT-NYSTATIN MT SUSP: OROMUCOSAL | Status: DC

## 2012-12-17 MED ORDER — FEXOFENADINE-PSEUDOEPHED ER 60-120 MG PO TB12: 1.0000 | ORAL_TABLET | Freq: Two times a day (BID) | ORAL | Status: DC

## 2012-12-17 MED ORDER — TRAMADOL HCL 50 MG PO TABS: 100.0000 mg | ORAL_TABLET | Freq: Three times a day (TID) | ORAL | Status: DC | PRN

## 2012-12-17 MED ORDER — BENZONATATE 200 MG PO CAPS: 200.0000 mg | ORAL_CAPSULE | Freq: Three times a day (TID) | ORAL | Status: DC | PRN

## 2012-12-17 NOTE — ED Provider Notes (Signed)
Chief Complaint  Patient presents with  . Blister    History of Present Illness:   [name]  is a 42 year old female who has had a two-day history of sores in her mouth, under the tongue on the left side, swelling in her neck, aching in her ears, or drainage at the ears, temperature of up to 101, nasal congestion, headache, and a cough productive of green sputum. She denies any sore throat, or GI symptoms. She has not been exposed to anything in particular she has not tried any medication for home relief. She is bipolar and is on a long list of medications for this.  Review of Systems:  Other than noted above, the patient denies any of the following symptoms. Systemic:  No fever, chills, sweats, fatigue, myalgias, headache, or anorexia. Eye:  No redness, pain or drainage. ENT:  No earache, ear congestion, nasal congestion, sneezing, rhinorrhea, sinus pressure, sinus pain, post nasal drip, or sore throat. Lungs:  No cough, sputum production, wheezing, shortness of breath, or chest pain. GI:  No abdominal pain, nausea, vomiting, or diarrhea.  PMFSH:  Past medical history, family history, social history, meds, and allergies were reviewed.  Physical Exam:   Vital signs:  BP 103/69  Pulse 78  Temp(Src) 98 F (36.7 C) (Oral)  Resp 12  SpO2 95% General:  Alert, in no distress. Eye:  No conjunctival injection or drainage. Lids were normal. ENT:  TMs and canals were normal, without erythema or inflammation.  Nasal mucosa was clear and uncongested, without drainage.  Mucous membranes were moist.  Pharynx was clear, without exudate or drainage.  There were no oral ulcerations or lesions. There was some tenderness of her tongue on the left, but no ulcers, erythema, or swelling. Neck:  Supple, no adenopathy, tenderness or mass. Lungs:  No respiratory distress.  Lungs were clear to auscultation, without wheezes, rales or rhonchi.  Breath sounds were clear and equal bilaterally.  Heart:   Regular rhythm, without gallops, murmers or rubs. Skin:  Clear, warm, and dry, without rash or lesions.  Assessment:  The encounter diagnosis was Viral upper respiratory infection.  Plan:   1.  The following meds were prescribed:   Discharge Medication List as of 12/17/2012  2:40 PM    START taking these medications   Details  benzonatate (TESSALON) 200 MG capsule Take 1 capsule (200 mg total) by mouth 3 (three) times daily as needed for cough., Starting 12/17/2012, Until Discontinued, Normal    Diphenhyd-Hydrocort-Nystatin SUSP 1 tsp QID, held in mouth 5 minutes then swallowed., Normal    fexofenadine-pseudoephedrine (ALLEGRA-D) 60-120 MG per tablet Take 1 tablet by mouth every 12 (twelve) hours., Starting 12/17/2012, Until Discontinued, Normal    !! traMADol (ULTRAM) 50 MG tablet Take 2 tablets (100 mg total) by mouth every 8 (eight) hours as needed for pain., Starting 12/17/2012, Until Discontinued, Normal     !! - Potential duplicate medications found. Please discuss with provider.     2.  The patient was instructed in symptomatic care and handouts were given. 3.  The patient was told to return if becoming worse in any way, if no better in 3 or 4 days, and given some red flag symptoms that would indicate earlier return.   Reuben Likes, MD 12/17/12 [phone]

## 2012-12-17 NOTE — ED Notes (Signed)
Pt c/o a mouth blister x2 days Pain radiates towards left ear Sx include: nauseas Denies: f/v/d Has gargled warm salt water  She is alert w/no signs of acute distress.

## 2012-12-23 ENCOUNTER — Encounter (HOSPITAL_COMMUNITY): Payer: Self-pay | Admitting: Emergency Medicine

## 2012-12-23 ENCOUNTER — Emergency Department (HOSPITAL_COMMUNITY)
Admission: EM | Admit: 2012-12-23 | Discharge: 2012-12-23 | Disposition: A | Discharge status: Home / Self Care | Payer: Medicaid Other | Source: Home / Self Care | Attending: Family Medicine | Admitting: Family Medicine

## 2012-12-23 ENCOUNTER — Telehealth (HOSPITAL_COMMUNITY): Payer: Self-pay | Admitting: Family Medicine

## 2012-12-23 DIAGNOSIS — G500 Trigeminal neuralgia: Secondary | ICD-10-CM

## 2012-12-23 MED ORDER — PREGABALIN 100 MG PO CAPS: 100.0000 mg | ORAL_CAPSULE | Freq: Two times a day (BID) | ORAL | Status: DC

## 2012-12-23 NOTE — ED Notes (Signed)
Pt was seen on 2/17 for tongue infection. Pt states that she is using meds as directed but they do not seem to be working.  Pt states that she is having chills , still unable to eat solid foods.

## 2012-12-23 NOTE — ED Notes (Signed)
Patient called stating that the Lyrica requires prior authorization. Patient made aware that would have to come from her PCP.  Patient expressed understanding

## 2012-12-23 NOTE — ED Provider Notes (Signed)
History     CSN: [id number]  Arrival date & time 12/23/12  1302   First MD Initiated Contact with Patient 12/23/12 1311      Chief Complaint  Patient presents with  . Mouth Lesions    infection of tongue not any better. chills.     (Consider location/radiation/quality/duration/timing/severity/associated sxs/prior treatment) Patient is a 42 y.o. female presenting with mouth sores. The history is provided by the patient.  Mouth Lesions  The current episode started more than 2 weeks ago (seen 2/17 for mouth pain, still with sx.). The problem has been unchanged. The problem is moderate. The symptoms are relieved by one or more prescription drugs. Associated symptoms include ear pain and mouth sores. Pertinent negatives include no fever, no decreased vision, no congestion, no ear discharge and no rhinorrhea.    Past Medical History  Diagnosis Date  . Deep venous thrombosis of leg 2012    completed year of coumadin   . Hx of measles   . Migraines   . Monilia infection 12/31/10  . Vaginal atrophy 03/04/11  . Dyspareunia 03/04/11  . Bipolar 1 disorder     Followed by Mental Health.  All psychiatric medications prescribed by Mental Health.  Stable for many years.    . Hypothyroidism     Hypothyroidism since 8th grade.  Managed by Dr. Sharl Ma, Endocrinology.  On synthroid.  No prior thyroid surgery/ablation.     Past Surgical History  Procedure Laterality Date  . 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 Cesarean section  1998  . Tonsillectomy    . Wisdom tooth extraction    . Cholecystectomy    . Left elbow    . Abdominal hysterectomy  2000  . Appendectomy      Family History  Problem Relation Age of Onset  . Cancer Maternal Aunt     History  Substance Use Topics  . Smoking status: Current Every Day Smoker -- 0.50 packs/day for 25 years    Types: Cigarettes  . Smokeless tobacco: Never Used  . Alcohol Use: No    OB History   Grav Para Term Preterm Abortions TAB SAB Ect  Mult Living   1 1 1       1       Review of Systems  Constitutional: Negative.  Negative for fever.  HENT: Positive for ear pain and mouth sores. Negative for congestion, rhinorrhea and ear discharge.   Eyes: Negative.     Allergies  Codeine; Meloxicam; Morphine and related; Naproxen; and Sulfonamide derivatives  Home Medications   Current Outpatient Rx  Name  Route  Sig  Dispense  Refill  . ARIPiprazole (ABILIFY) 5 MG tablet   Oral   Take 10 mg by mouth daily.          Marland Kitchen buPROPion (WELLBUTRIN XL) 300 MG 24 hr tablet   Oral   Take 300 mg by mouth daily.         . fish oil-omega-3 fatty acids 1000 MG capsule   Oral   Take 1 g by mouth daily.          Marland Kitchen gabapentin (NEURONTIN) 100 MG capsule   Oral   Take 300 mg by mouth daily.         Marland Kitchen levothyroxine (SYNTHROID, LEVOTHROID) 88 MCG tablet   Oral   Take 88 mcg by mouth daily.         Marland Kitchen liothyronine (CYTOMEL) 5 MCG tablet   Oral  Take 5 mcg by mouth daily.         . simvastatin (ZOCOR) 40 MG tablet   Oral   Take 40 mg by mouth every morning.          . Vitamin D, Ergocalciferol, (DRISDOL) 50000 UNITS CAPS   Oral   Take 50,000 Units by mouth every 7 (seven) days. mondays         . acetaminophen (TYLENOL) 325 MG tablet   Oral   Take 1 tablet (325 mg total) by mouth every 4 (four) hours as needed for pain.   100 tablet   2   . benzonatate (TESSALON) 200 MG capsule   Oral   Take 1 capsule (200 mg total) by mouth 3 (three) times daily as needed for cough.   30 capsule   0   . clonazePAM (KLONOPIN) 0.5 MG tablet   Oral   Take 0.5 mg by mouth 2 (two) times daily. Scheduled. For anxiety         . dextromethorphan (DELSYM) 30 MG/5ML liquid   Oral   Take 10 mLs (60 mg total) by mouth 2 (two) times daily.   89 mL   0   . Diphenhyd-Hydrocort-Nystatin SUSP      1 tsp QID, held in mouth 5 minutes then swallowed.   4 oz   1   . divalproex (DEPAKOTE ER) 500 MG 24 hr tablet   Oral   Take  1,000 mg by mouth at bedtime.         . fexofenadine-pseudoephedrine (ALLEGRA-D) 60-120 MG per tablet   Oral   Take 1 tablet by mouth every 12 (twelve) hours.   30 tablet   0   . fluticasone (FLONASE) 50 MCG/ACT nasal spray   Nasal   Place 2 sprays into the nose daily. Dispense 1 bottle   16 g   1   . glucose blood (ACCU-CHEK AVIVA PLUS) test strip      Use as instructed   100 each   12   . ondansetron (ZOFRAN) 4 MG tablet   Oral   Take 1 tablet (4 mg total) by mouth every 6 (six) hours. As needed for n/v   8 tablet   0   . pregabalin (LYRICA) 100 MG capsule   Oral   Take 1 capsule (100 mg total) by mouth 2 (two) times daily.   30 capsule   1   . traMADol (ULTRAM) 50 MG tablet   Oral   Take 1 tablet (50 mg total) by mouth every 8 (eight) hours as needed for pain.   60 tablet   1   . traMADol (ULTRAM) 50 MG tablet   Oral   Take 2 tablets (100 mg total) by mouth every 8 (eight) hours as needed for pain.   30 tablet   0   . traZODone (DESYREL) 100 MG tablet   Oral   Take 200 mg by mouth at bedtime.            BP 113/77  Pulse 81  Temp(Src) 98.8 F (37.1 C) (Oral)  Resp 19  SpO2 100%  Physical Exam  Nursing note and vitals reviewed. Constitutional: She is oriented to person, place, and time. She appears well-developed and well-nourished.  HENT:  Right Ear: External ear normal.  Left Ear: External ear normal.  Mouth/Throat: Oropharynx is clear and moist. No oropharyngeal exudate.  Eyes: Conjunctivae are normal. Pupils are equal, round, and reactive to light.  Neck: Normal  range of motion. Neck supple.  Lymphadenopathy:    She has no cervical adenopathy.  Neurological: She is alert and oriented to person, place, and time.  Skin: Skin is warm and dry. No rash noted.    ED Course  Procedures (including critical care time)  Labs Reviewed - No data to display No results found.   1. Trigeminal neuralgia syndrome       MDM           Linna Hoff, MD 12/23/12 1414

## 2012-12-24 ENCOUNTER — Encounter: Payer: Self-pay | Admitting: Internal Medicine

## 2012-12-24 ENCOUNTER — Telehealth: Payer: Self-pay | Admitting: *Deleted

## 2012-12-24 ENCOUNTER — Ambulatory Visit (INDEPENDENT_AMBULATORY_CARE_PROVIDER_SITE_OTHER): Payer: Medicaid Other | Admitting: Internal Medicine

## 2012-12-24 DIAGNOSIS — H60399 Other infective otitis externa, unspecified ear: Secondary | ICD-10-CM

## 2012-12-24 DIAGNOSIS — H6090 Unspecified otitis externa, unspecified ear: Secondary | ICD-10-CM | POA: Insufficient documentation

## 2012-12-24 DIAGNOSIS — H6092 Unspecified otitis externa, left ear: Secondary | ICD-10-CM

## 2012-12-24 MED ORDER — HYDROCODONE-ACETAMINOPHEN 5-500 MG PO TABS: 1.0000 | ORAL_TABLET | Freq: Four times a day (QID) | ORAL | Status: DC | PRN

## 2012-12-24 MED ORDER — CIPROFLOXACIN-DEXAMETHASONE 0.3-0.1 % OT SUSP: 4.0000 [drp] | Freq: Two times a day (BID) | OTIC | Status: DC

## 2012-12-24 NOTE — Progress Notes (Signed)
Patient ID: [name], female   DOB: December 10, 1970, 42 y.o.   MRN: [id number]  Subjective:   Patient ID: [name] female   DOB: [birth year] 42 y.o.   MRN: [id number]  HPI: [name] is a 42 y.o. medical history of hypothyroidism, bipolar disorder, and history of migraines, who presents to the clinic for acute visit with left-sided facial pain for 3 days. The pain has been nonprogressive since onset. It is dull and achy in nature and constant but worsens every 5 minutes to a 10/10 and worse at night. She also describes some burning sensation, and reduced sensation to touch on that side of the face. The pain is increased by eating and lying on the right side. The pain extends from the left frontal area all the way down to the upper part of her left neck. She describes pain in her left ear as well. However, there is no drainage. She is not sure, whether hearing is reduced. She denies tinnitus. Denies photophobia, eye pain, redness, hearing loss, ear pain, congestion, sore throat, rhinorrhea, sneezing, mouth sores, and trouble swallowing. She has not used any medication since her prescription of Lyrica from Urgent Care was denied by her insurance yesterday. She denies any history of trauma. She denies history of fevers or chills. She does not have any nausea or discharge, rash, no recent sick contacts, her appetite has been stable and normal symptoms of fatigue. The right side is completely normal. She denies any sores in her mouth. However, she wheezing, increases pain, especially on the left side. This is the first time in her life she has of experienced this kind of pain.   Past Medical History  Diagnosis Date  . Deep venous thrombosis of leg 2012    completed year of coumadin   . Hx of measles   . Migraines   . Monilia infection 12/31/10  . Vaginal atrophy 03/04/11  . Dyspareunia 03/04/11  . Bipolar 1 disorder     Followed by Mental Health.  All psychiatric medications prescribed by  Mental Health.  Stable for many years.    . Hypothyroidism     Hypothyroidism since 8th grade.  Managed by Dr. Sharl Ma, Endocrinology.  On synthroid.  No prior thyroid surgery/ablation.    Current Outpatient Prescriptions  Medication Sig Dispense Refill  . acetaminophen (TYLENOL) 325 MG tablet Take 1 tablet (325 mg total) by mouth every 4 (four) hours as needed for pain.  100 tablet  2  . ARIPiprazole (ABILIFY) 5 MG tablet Take 10 mg by mouth daily.       Marland Kitchen buPROPion (WELLBUTRIN XL) 300 MG 24 hr tablet Take 300 mg by mouth daily.      . clonazePAM (KLONOPIN) 0.5 MG tablet Take 0.5 mg by mouth 2 (two) times daily. Scheduled. For anxiety      . divalproex (DEPAKOTE ER) 500 MG 24 hr tablet Take 1,000 mg by mouth at bedtime.      . fish oil-omega-3 fatty acids 1000 MG capsule Take 1 g by mouth daily.       . fluticasone (FLONASE) 50 MCG/ACT nasal spray Place 2 sprays into the nose daily. Dispense 1 bottle  16 g  1  . gabapentin (NEURONTIN) 100 MG capsule Take 300 mg by mouth daily.      Marland Kitchen glucose blood (ACCU-CHEK AVIVA PLUS) test strip Use as instructed  100 each  12  . levothyroxine (SYNTHROID, LEVOTHROID) 88 MCG tablet Take 88 mcg by mouth daily.      Marland Kitchen  liothyronine (CYTOMEL) 5 MCG tablet Take 5 mcg by mouth daily.      . simvastatin (ZOCOR) 40 MG tablet Take 40 mg by mouth every morning.       . traZODone (DESYREL) 100 MG tablet Take 200 mg by mouth at bedtime.       . ciprofloxacin-dexamethasone (CIPRODEX) otic suspension Place 4 drops into the left ear 2 (two) times daily.  7.5 mL  0  . Diphenhyd-Hydrocort-Nystatin SUSP 1 tsp QID, held in mouth 5 minutes then swallowed.  4 oz  1  . HYDROcodone-acetaminophen (VICODIN) 5-500 MG per tablet Take 1 tablet by mouth every 6 (six) hours as needed for pain.  20 tablet  0  . Vitamin D, Ergocalciferol, (DRISDOL) 50000 UNITS CAPS Take 50,000 Units by mouth every 7 (seven) days. mondays       No current facility-administered medications for this visit.    Family History  Problem Relation Age of Onset  . Cancer Maternal Aunt    History   Social History  . Marital Status: Legally Separated    Spouse Name: N/A    Number of Children: N/A  . Years of Education: N/A   Social History Main Topics  . Smoking status: Current Every Day Smoker -- 0.50 packs/day for 25 years    Types: Cigarettes  . Smokeless tobacco: Never Used  . Alcohol Use: No  . Drug Use: No  . Sexually Active: Yes    Birth Control/ Protection: Surgical     Comment: hysterectomy   Other Topics Concern  . None   Social History Narrative  . None   Review of Systems: Constitutional: Denies fever, chills, diaphoresis, appetite change and fatigue.  Respiratory: Denies SOB, DOE, cough, chest tightness,  and wheezing.   Cardiovascular: Denies chest pain, palpitations and leg swelling.  Gastrointestinal: Denies nausea, vomiting, abdominal pain, diarrhea, constipation, blood in stool and abdominal distention.  Genitourinary: Denies dysuria, urgency, frequency, hematuria, flank pain and difficulty urinating.  Musculoskeletal: Denies myalgias, back pain, joint swelling, arthralgias and gait problem.  Skin: Denies pallor, rash and wound.  Neurological: Denies dizziness, seizures, syncope, weakness, light-headedness, numbness and headaches.  Hematological: Denies adenopathy. Easy bruising, personal or family bleeding history  Psychiatric/Behavioral: Denies suicidal ideation, mood changes, confusion, nervousness, sleep disturbance and agitation  Objective:  Physical Exam: There were no vitals filed for this visit. Constitutional: Vital signs reviewed.  Patient is a well-developed and well-nourished. She appears to be in pain intermittently with tearing. Her mother is present in the exam room. She is cooperative with exam. Alert and oriented x3.  Head: Normocephalic and atraumatic Face: No rash or any visible lesions, no edema of the face or eyes. Symmetric. There is altered  sensation on the left side, in the distributing of V1, V2 and V3. Bucal muscles have good strength.  Left Ear: There is tenderness on the left tragus and introduction of the otoscope is painful. Pulling on the ear reproduces pain too. There appears to be some mild edema of the anterior wall of the external auditory canal. There is no drainage,TM normal. Right Ear: Normal exam. Mouth: no erythema or exudates, MMM, No sores visible however, pressure from left buccal mucosa elicits pain. She has dental caries but there is evidence of gingivitis. Eyes: PERRL, EOMI, conjunctivae normal, No scleral icterus.  Neck: Supple, Trachea midline normal ROM, No JVD, mass, thyromegaly, or carotid bruit present.  Cardiovascular: RRR, S1 normal, S2 normal, no MRG, pulses symmetric and intact bilaterally Pulmonary/Chest: CTAB, no  wheezes, rales, or rhonchi Abdominal: Soft. Non-tender, non-distended, bowel sounds are normal, no masses, organomegaly, or guarding present.  GU: no CVA tenderness Musculoskeletal: No joint deformities, erythema, or stiffness, ROM full and no nontender Hematology: no cervical, inginal, or axillary adenopathy.  Neurological: A&O x3, Strength is normal and symmetric bilaterally, cranial nerve II-XII are grossly intact, no focal motor deficit, sensory intact to light touch bilaterally.  Skin: Warm, dry and intact. No rash, cyanosis, or clubbing.  Psychiatric: Normal mood and affect. speech and behavior is normal. Judgment and thought content normal. Cognition and memory are normal.   Assessment & Plan:  My assessment, and plan has been discussed with Dr. Meredith Pel as detailed under the problem based charting.

## 2012-12-24 NOTE — Assessment & Plan Note (Addendum)
Her presentation with left-sided facial pain, and subtle signs of left auditory canal inflammation pointed towards otitis externa. We spent close to a full hour during her clinic evaluation and considered other possibilities, including trigeminal neuralgia or Ramsay Hunt syndrome. However, these differential diagnosis were less clinically supported. She does not have obvious vesicular  Herpes Zoster eruptions which would be expected in Ramsay Hunt syndrome. She also denies fevers or chills, or or drainage, which excludes otitis media in this case. In any case, that the most likely diagnosis in this case is otitis externa and trigeminal neuralgia.  Plan  - treat with Ciprodex cream  - Vicodin for pain relief - She will be re-evaluated by Dr Manson Passey in 2 days  - If she does not improve after a few days of this remedies, will need to consider treatment her for trigeminal neuralgia. However, given her long list of medication including at least 4 with psychometric properties, drug interaction will be an issue.  - referral to neurology will also be considered if TN can not be confidently excluded.

## 2012-12-24 NOTE — Telephone Encounter (Signed)
I would advise that patient be seen in clinic and evaluated.

## 2012-12-24 NOTE — Patient Instructions (Signed)
Please keep your appointment with dr Manson Passey on Wednesday Please apply Ciprodex Please take Vicodin  You will be re-evaluated in two days  Please call clinic if symptoms persist

## 2012-12-24 NOTE — Telephone Encounter (Signed)
Pt went to urg care this wkend and was prescribed lyrica, insurance requires a Prior Authorization on this med which after talking w/ kayeg. Will probably take at least 72 hrs. Pt has an appt w/ dr brown 2/26. Would it be possible to prescribe her something for a few days until she sees dr brown and he can evaluate the need for longer prescribed med/ meds or the need for lyrica? Please advise. This is sent to dr brown and the attending of the day

## 2012-12-24 NOTE — Telephone Encounter (Signed)
Of note, I am unable to do PA request over phone as I do not have enough pt information about this pt and her diagnosis.  Per medicaid guidelines, if the request for lyrica is for a diagnosis other than a seizure disorder, the pt must have a diagnosis of neuropathic pain AND  have a documented trial and failure with a 60 day trial of at least 1 of the following agents in the past 12 months  (tricyclic antidepressant, gabapentin, carbamazepine, valproic acid).  PA request forms have been placed in pcp's box for review.Kingsley Spittle Cassady2/24/20142:42 PM

## 2012-12-24 NOTE — Telephone Encounter (Signed)
Called pt [name] insists on being seen today, appt given for dr Zada Girt at 1515, pt states [name] will be here at 1520, [name] refused 2/25 appt. States [name] is dying and needs pain med

## 2012-12-25 ENCOUNTER — Ambulatory Visit: Payer: Medicaid Other | Admitting: Internal Medicine

## 2012-12-26 ENCOUNTER — Encounter: Payer: Self-pay | Admitting: Internal Medicine

## 2012-12-26 ENCOUNTER — Ambulatory Visit (INDEPENDENT_AMBULATORY_CARE_PROVIDER_SITE_OTHER): Payer: Medicaid Other | Admitting: Internal Medicine

## 2012-12-26 ENCOUNTER — Ambulatory Visit: Payer: Medicaid Other | Admitting: Obstetrics and Gynecology

## 2012-12-26 VITALS — BP 115/78 | HR 56 | Temp 97.8°F | Ht 65.0 in | Wt 204.3 lb

## 2012-12-26 DIAGNOSIS — H60399 Other infective otitis externa, unspecified ear: Secondary | ICD-10-CM

## 2012-12-26 DIAGNOSIS — H6092 Unspecified otitis externa, left ear: Secondary | ICD-10-CM

## 2012-12-26 MED ORDER — SIMVASTATIN 40 MG PO TABS: 40.0000 mg | ORAL_TABLET | Freq: Every morning | ORAL | Status: DC

## 2012-12-26 NOTE — Patient Instructions (Addendum)
General Instructions: Your ear pain may be due to an ear infection called Otitis Externa, or may be due to a nerve condition called Trigeminal Neuralgia.  We will treat you for both conditions. -continue the ear drops, twice per day, prescribed by Dr. Zada Girt -we will start you on Lyrica, 75 mg, 1 tablet twice per day.  If we are unable to obtain prior authorization for Lyrica, instead you can try taking Gabapentin, 300 mg three times per day.  If your symptoms do not improve in 1 week, call our clinic, and we will need to refer your to an ENT (Ear, Nose, and Throat) specialist.  Please return for a follow-up visit in 2 weeks.  Treatment Goals:  Goals (1 Years of Data) as of 12/26/12   None      Progress Toward Treatment Goals:  Treatment Goal 12/26/2012  Stop smoking -  Other  at goal    Self Care Goals & Plans:  Self Care Goal 10/09/2012  Manage my medications take my medicines as prescribed; bring my medications to every visit  Monitor my health keep track of my blood glucose  Eat healthy foods eat smaller portions; eat foods that are low in salt; eat baked foods instead of fried foods       Care Management & Community Referrals:  Referral 12/26/2012  Referrals made for care management support none needed

## 2012-12-26 NOTE — Assessment & Plan Note (Addendum)
The patient continues to note left ear pain, as well as physical exam evidence of increased sensitivity to light touch and pinprick in left V2 and V3 distributions.  Minimal to no relief with cipro-dexamethasone ear drops, with which she reports compliance (though no evidence of ear drop residue on physical exam).  Differential still includes otitis externa vs trigeminal neuralgia vs TMJ.  Will treat for both, and if symptoms don't resolve, patient may need ENT referral to consider less common possibilities that could cause ear pain or trigeminal nerve compression. -continue cipro-dexamethasone ear drops, 4 drops BID for 10 days -will prescribe Lyrica, since patient has failed a trial of gabapentin. -patient instructed to call clinic in 1 week if no improvement, and will refer to ENT  Addendum: Lyrica prior authorization request has been sent to [facility], still pending.  For now, we'll prescribe gabapentin, 300 mg TID for the pain, as well as a short-term prescription for hydrocodone for breakthrough pain.  We will continue to wait to see if Lyrica will be approved, and if the patient has not improved by next week, we will refer to ENT.  Addendum: Patient still has not experienced relief after almost 1 month, after trying flexeril, gabapentin, and hydrocodone-acetaminophen.  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  However, given continued pain, we will refer to ENT at this time (01/17/13)

## 2012-12-26 NOTE — Progress Notes (Signed)
HPI The patient is a 42 y.o. female with a history of hypothyroidism (followed by Dr. Sharl Ma), HL, bipolar disorder, presenting for a 2-day follow-up for ear pain.  The patient was seen 2 days ago for left-sided ear pain and was prescribed cipro-dexamethasone ear drops for otitis externa, which she has been taking, with only minimal relief.  Patient still notes left ear pain, described as a constant aching pain maximally in her left ear with radiation to her left face, with decreased hearing in affected ear, tinnitus, left maxillary sinus tenderness, no ear drainage, no fevers, no sore throat.  The patient has been on gabapentin for 2 years, prescribed by Dr. Pennie Rushing, but cannot recall why she is taking this medication.  ROS: General: no fevers, chills, changes in weight, changes in appetite Skin: no rash HEENT: see HPI Pulm: no dyspnea, coughing, wheezing CV: no chest pain, palpitations, shortness of breath Abd: no abdominal pain, nausea/vomiting, diarrhea/constipation GU: no dysuria, hematuria, polyuria Ext: no arthralgias, myalgias Neuro: no weakness, numbness, or tingling  Filed Vitals:   12/26/12 1321  BP: 115/78  Pulse: 56  Temp: 97.8 F (36.6 C)    PEX General: alert, cooperative, appears distressed HEENT: PERRL, EOMI, oropharynx non-erythematous, left external auditory canal slightly erythematous with no evidence of ear drop residue.   Neck: supple, no lymphadenopathy, JVD, or carotid bruits Lungs: clear to ascultation bilaterally, normal work of respiration, no wheezes, rales, ronchi Heart: regular rate and rhythm, no murmurs, gallops, or rubs Abdomen: soft, non-tender, non-distended, normal bowel sounds Msk: no joint edema, warmth, or erythema Extremities: 2+ DP/PT pulses bilaterally, no cyanosis, clubbing, or edema Neurologic: alert & oriented X3, Sensation testing to light touch and pinprick to bilateral face revealed increased sensitivity to both sensations on left CN V2  and V3 distribution, with sparing of V1 distribution cranial nerves.  Slightly decreased hearing in left ear.  All other CN intact.  Strength grossly intact  Current Outpatient Prescriptions on File Prior to Visit  Medication Sig Dispense Refill  . acetaminophen (TYLENOL) 325 MG tablet Take 1 tablet (325 mg total) by mouth every 4 (four) hours as needed for pain.  100 tablet  2  . ARIPiprazole (ABILIFY) 5 MG tablet Take 10 mg by mouth daily.       Marland Kitchen buPROPion (WELLBUTRIN XL) 300 MG 24 hr tablet Take 300 mg by mouth daily.      . ciprofloxacin-dexamethasone (CIPRODEX) otic suspension Place 4 drops into the left ear 2 (two) times daily.  7.5 mL  0  . clonazePAM (KLONOPIN) 0.5 MG tablet Take 0.5 mg by mouth 2 (two) times daily. Scheduled. For anxiety      . Diphenhyd-Hydrocort-Nystatin SUSP 1 tsp QID, held in mouth 5 minutes then swallowed.  4 oz  1  . divalproex (DEPAKOTE ER) 500 MG 24 hr tablet Take 1,000 mg by mouth at bedtime.      . fish oil-omega-3 fatty acids 1000 MG capsule Take 1 g by mouth daily.       . fluticasone (FLONASE) 50 MCG/ACT nasal spray Place 2 sprays into the nose daily. Dispense 1 bottle  16 g  1  . gabapentin (NEURONTIN) 100 MG capsule Take 300 mg by mouth daily.      Marland Kitchen glucose blood (ACCU-CHEK AVIVA PLUS) test strip Use as instructed  100 each  12  . HYDROcodone-acetaminophen (VICODIN) 5-500 MG per tablet Take 1 tablet by mouth every 6 (six) hours as needed for pain.  20 tablet  0  .  levothyroxine (SYNTHROID, LEVOTHROID) 88 MCG tablet Take 88 mcg by mouth daily.      Marland Kitchen liothyronine (CYTOMEL) 5 MCG tablet Take 5 mcg by mouth daily.      . simvastatin (ZOCOR) 40 MG tablet Take 40 mg by mouth every morning.       . traZODone (DESYREL) 100 MG tablet Take 200 mg by mouth at bedtime.       . Vitamin D, Ergocalciferol, (DRISDOL) 50000 UNITS CAPS Take 50,000 Units by mouth every 7 (seven) days. mondays       No current facility-administered medications on file prior to visit.     Assessment/Plan

## 2012-12-28 ENCOUNTER — Other Ambulatory Visit: Payer: Self-pay | Admitting: Internal Medicine

## 2012-12-28 DIAGNOSIS — H6092 Unspecified otitis externa, left ear: Secondary | ICD-10-CM

## 2012-12-28 MED ORDER — GABAPENTIN 100 MG PO CAPS: 300.0000 mg | ORAL_CAPSULE | Freq: Two times a day (BID) | ORAL | Status: DC

## 2012-12-28 MED ORDER — GABAPENTIN 100 MG PO CAPS: 300.0000 mg | ORAL_CAPSULE | Freq: Three times a day (TID) | ORAL | Status: DC

## 2012-12-28 NOTE — Telephone Encounter (Signed)
Pt called stating she was seen in clinic 2/26 by Dr Manson Passey and she is asking for lyrica for pain.  She is still having pain and has not heard from [place]. She needs something for the week end.  She is out of vicodin that was given to her on Monday by Dr Zada Girt. [facility]  Pt # [id number]

## 2012-12-28 NOTE — Telephone Encounter (Signed)
I called medicaid prior auth.  They've received the PA application for Lyrica (sent 2/27), but it's still under review.  There's some hold-up with my NPI number that we're still trying to figure out.  In the meantime, I'm prescribing the patient an increased dose of gabapentin, as well as another prescription for hydrocodone for short-term pain.  I called the patient and explained all of this to her today.

## 2012-12-28 NOTE — Telephone Encounter (Signed)
Rx for vicodin # 20  called in

## 2013-01-01 ENCOUNTER — Other Ambulatory Visit: Payer: Self-pay | Admitting: *Deleted

## 2013-01-01 ENCOUNTER — Other Ambulatory Visit: Payer: Self-pay | Admitting: Internal Medicine

## 2013-01-01 DIAGNOSIS — H6092 Unspecified otitis externa, left ear: Secondary | ICD-10-CM

## 2013-01-01 NOTE — Telephone Encounter (Signed)
Pt calls and states in message the pain in her jaw is killing her and she must have something for the pain, i see where you refused the refill, please advise asap, or you may call her at [phone]

## 2013-01-02 ENCOUNTER — Telehealth: Payer: Self-pay | Admitting: Internal Medicine

## 2013-01-02 DIAGNOSIS — H6092 Unspecified otitis externa, left ear: Secondary | ICD-10-CM

## 2013-01-02 MED ORDER — CIPROFLOXACIN-DEXAMETHASONE 0.3-0.1 % OT SUSP: 4.0000 [drp] | Freq: Two times a day (BID) | OTIC | Status: DC

## 2013-01-02 MED ORDER — CYCLOBENZAPRINE HCL 5 MG PO TABS: 5.0000 mg | ORAL_TABLET | Freq: Three times a day (TID) | ORAL | Status: DC | PRN

## 2013-01-02 MED ORDER — HYDROCODONE-ACETAMINOPHEN 5-500 MG PO TABS: 1.0000 | ORAL_TABLET | Freq: Four times a day (QID) | ORAL | Status: DC | PRN

## 2013-01-02 NOTE — Telephone Encounter (Signed)
Patient calls in requesting to send her prescription of ciprodex to her pharmacy- still complains of some left ear pain but getting better.  Prescription sent electronically to the pharmacy.  Thanks, [employer]

## 2013-01-02 NOTE — Telephone Encounter (Signed)
Patient called, requesting refill of hydrocodone.  Patient still notes left-sided face pain, though she believes it is improving.  The increased dose of Gabapentin seems to be helping.  Patient asks for a medication for breakthrough pain.  I'll prescribe flexeril for any component of TMJ, and refill the hydrocodone for breakthrough pain.  If symptoms don't improve, we'll refer to ENT.

## 2013-01-16 ENCOUNTER — Telehealth: Payer: Self-pay | Admitting: *Deleted

## 2013-01-16 ENCOUNTER — Other Ambulatory Visit: Payer: Self-pay | Admitting: *Deleted

## 2013-01-16 NOTE — Telephone Encounter (Signed)
Pt is very upset, would like for dr brown to please call her at [phone] ASAP

## 2013-01-17 MED ORDER — TRAMADOL HCL 50 MG PO TABS: 100.0000 mg | ORAL_TABLET | Freq: Four times a day (QID) | ORAL | Status: DC | PRN

## 2013-01-17 NOTE — Addendum Note (Signed)
Addended by: Linward Headland on: 01/17/2013 01:58 PM   Modules accepted: Orders

## 2013-01-17 NOTE — Telephone Encounter (Signed)
I returned the patient's phone call.  She continues to note left-sided jaw and face pain.  She has also developed left-sided throat pain.  The patient has been taking flexeril, which helps the pain somewhat.  The patient has also been taking hydrocodone-acetaminophen, which resolves the pain.  At this point, since the patient's symptoms have not improved, I believe it is time to refer to ENT.  The patient has seen Dr. Ezzard Standing in the past, and has a good relationship with Dr. Ezzard Standing (self-reported).  We will send the referral.  In the meantime, we will stop Hydrocodone-acetaminophen, but start Tramadol, in addition to the patient's existing tylenol, gabapentin, and flexeril.  The patient expressed understanding.

## 2013-01-30 ENCOUNTER — Encounter: Payer: Medicaid Other | Admitting: Internal Medicine

## 2013-03-07 ENCOUNTER — Other Ambulatory Visit: Payer: Self-pay | Admitting: Internal Medicine

## 2013-03-07 ENCOUNTER — Telehealth: Payer: Self-pay | Admitting: *Deleted

## 2013-03-07 DIAGNOSIS — Z780 Asymptomatic menopausal state: Secondary | ICD-10-CM

## 2013-03-07 NOTE — Telephone Encounter (Signed)
Please refer pt to [facility] for bone density scan, pt forgot she was reminded in feb by the [facility]- [phone]

## 2013-03-08 NOTE — Telephone Encounter (Signed)
The patient called our clinic, requesting a Bone Density Scan.  Per chart review, the patient is noted by her OB/GYN to be post-menopausal, and she is currently smoking, so there is a grade 2B recommendation for bone density screening.  Will send order.

## 2013-03-17 ENCOUNTER — Encounter (HOSPITAL_COMMUNITY): Payer: Self-pay

## 2013-03-17 ENCOUNTER — Emergency Department (HOSPITAL_COMMUNITY)
Admission: EM | Admit: 2013-03-17 | Discharge: 2013-03-18 | Disposition: A | Discharge status: Home / Self Care | Payer: Medicaid Other | Attending: Emergency Medicine | Admitting: Emergency Medicine

## 2013-03-17 DIAGNOSIS — Z86718 Personal history of other venous thrombosis and embolism: Secondary | ICD-10-CM | POA: Insufficient documentation

## 2013-03-17 DIAGNOSIS — Z3202 Encounter for pregnancy test, result negative: Secondary | ICD-10-CM | POA: Insufficient documentation

## 2013-03-17 DIAGNOSIS — Z79899 Other long term (current) drug therapy: Secondary | ICD-10-CM | POA: Insufficient documentation

## 2013-03-17 DIAGNOSIS — IMO0002 Reserved for concepts with insufficient information to code with codable children: Secondary | ICD-10-CM | POA: Insufficient documentation

## 2013-03-17 DIAGNOSIS — Z87891 Personal history of nicotine dependence: Secondary | ICD-10-CM | POA: Insufficient documentation

## 2013-03-17 DIAGNOSIS — N390 Urinary tract infection, site not specified: Secondary | ICD-10-CM

## 2013-03-17 DIAGNOSIS — Z8619 Personal history of other infectious and parasitic diseases: Secondary | ICD-10-CM | POA: Insufficient documentation

## 2013-03-17 DIAGNOSIS — T50905A Adverse effect of unspecified drugs, medicaments and biological substances, initial encounter: Secondary | ICD-10-CM

## 2013-03-17 DIAGNOSIS — Z8742 Personal history of other diseases of the female genital tract: Secondary | ICD-10-CM | POA: Insufficient documentation

## 2013-03-17 DIAGNOSIS — F319 Bipolar disorder, unspecified: Secondary | ICD-10-CM | POA: Insufficient documentation

## 2013-03-17 DIAGNOSIS — F411 Generalized anxiety disorder: Secondary | ICD-10-CM | POA: Insufficient documentation

## 2013-03-17 DIAGNOSIS — R4182 Altered mental status, unspecified: Secondary | ICD-10-CM | POA: Insufficient documentation

## 2013-03-17 DIAGNOSIS — G43909 Migraine, unspecified, not intractable, without status migrainosus: Secondary | ICD-10-CM | POA: Insufficient documentation

## 2013-03-17 DIAGNOSIS — E039 Hypothyroidism, unspecified: Secondary | ICD-10-CM | POA: Insufficient documentation

## 2013-03-17 LAB — CBC WITH DIFFERENTIAL/PLATELET
Lymphocytes Relative: 47 % — ABNORMAL HIGH (ref 12–46)
Lymphs Abs: 2.8 10*3/uL (ref 0.7–4.0)
MCV: 89.8 fL (ref 78.0–100.0)
Neutro Abs: 2.8 10*3/uL (ref 1.7–7.7)
Neutrophils Relative %: 46 % (ref 43–77)
Platelets: 154 10*3/uL (ref 150–400)
RBC: 4.53 MIL/uL (ref 3.87–5.11)
WBC: 6.1 10*3/uL (ref 4.0–10.5)

## 2013-03-17 LAB — COMPREHENSIVE METABOLIC PANEL
Albumin: 3.3 g/dL — ABNORMAL LOW (ref 3.5–5.2)
BUN: 24 mg/dL — ABNORMAL HIGH (ref 6–23)
Chloride: 102 mEq/L (ref 96–112)
Creatinine, Ser: 1.05 mg/dL (ref 0.50–1.10)
GFR calc non Af Amer: 64 mL/min — ABNORMAL LOW (ref >90)
Total Bilirubin: 0.2 mg/dL — ABNORMAL LOW (ref 0.3–1.2)

## 2013-03-17 LAB — RAPID URINE DRUG SCREEN, HOSP PERFORMED
Amphetamines: POSITIVE — AB
Benzodiazepines: NOT DETECTED
Opiates: NOT DETECTED

## 2013-03-17 LAB — URINALYSIS, ROUTINE W REFLEX MICROSCOPIC
Glucose, UA: NEGATIVE mg/dL
Specific Gravity, Urine: 1.036 — ABNORMAL HIGH (ref 1.005–1.030)
pH: 7 (ref 5.0–8.0)

## 2013-03-17 LAB — URINE MICROSCOPIC-ADD ON

## 2013-03-17 LAB — GLUCOSE, CAPILLARY: Glucose-Capillary: 80 mg/dL (ref 70–99)

## 2013-03-17 MED ORDER — DEXTROSE 5 % IV SOLN
1.0000 g | Freq: Once | INTRAVENOUS | Status: AC
  Administered 2013-03-18: 1 g via INTRAVENOUS
  Filled 2013-03-17: qty 10

## 2013-03-17 NOTE — ED Notes (Signed)
Pt would like test results explained to her when her mother is present. Mother is on her way here now.

## 2013-03-17 NOTE — ED Notes (Signed)
Per EMS pt "acting funny sine Friday when meds got readjusted" neighbors say pt being acting drunk.  Pt unsure what happen to to meds

## 2013-03-17 NOTE — ED Provider Notes (Signed)
History     CSN: [id number]  Arrival date & time 03/17/13  1959   First MD Initiated Contact with Patient 03/17/13 2126      Chief Complaint  Patient presents with  . Dizziness  . Altered Mental Status    (Consider location/radiation/quality/duration/timing/severity/associated sxs/prior treatment) HPI Comments: Patient states her medications were increased by her psychologist on Thursday, on Friday she started noticing a difference, not feeling herself.  Trouble concentrating, ambulating had several episodes of diarrhea 2 days ago that has resolved.  Tonight she states she stumbled and fell to the ground but did not sustain any injuries.   Angelique Blonder SI/HI but admits to being sad- tried calling her mom to come stay with her but was refused   Patient is a 42 y.o. female presenting with altered mental status. The history is provided by the patient.  Altered Mental Status This is a new problem. The current episode started in the past 7 days. Pertinent negatives include no abdominal pain, anorexia, chest pain, chills, congestion, coughing, fever, myalgias, nausea, numbness, visual change, vomiting or weakness. Nothing aggravates the symptoms. She has tried nothing for the symptoms.    Past Medical History  Diagnosis Date  . Deep venous thrombosis of leg 2012    completed year of coumadin   . Hx of measles   . Migraines   . Monilia infection 12/31/10  . Vaginal atrophy 03/04/11  . Dyspareunia 03/04/11  . Bipolar 1 disorder     Followed by Mental Health.  All psychiatric medications prescribed by Mental Health.  Stable for many years.    . Hypothyroidism     Hypothyroidism since 8th grade.  Managed by Dr. Sharl Ma, Endocrinology.  On synthroid.  No prior thyroid surgery/ablation.     Past Surgical History  Procedure Laterality Date  . 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 Cesarean section  1998  . Tonsillectomy    . Wisdom tooth extraction    . Cholecystectomy    . Left elbow    .  Abdominal hysterectomy  2000  . Appendectomy      Family History  Problem Relation Age of Onset  . Cancer Maternal Aunt     History  Substance Use Topics  . Smoking status: Former Smoker -- 0.50 packs/day for 25 years    Types: Cigarettes    Quit date: 10/22/2012  . Smokeless tobacco: Never Used  . Alcohol Use: No    OB History   Grav Para Term Preterm Abortions TAB SAB Ect Mult Living   1 1 1       1       Review of Systems  Constitutional: Negative for fever, chills and appetite change.  HENT: Negative for congestion.   Eyes: Negative for visual disturbance.  Respiratory: Negative for cough and shortness of breath.   Cardiovascular: Negative for chest pain.  Gastrointestinal: Negative for nausea, vomiting, abdominal pain, diarrhea and anorexia.  Genitourinary: Negative for dysuria.  Musculoskeletal: Negative for myalgias and back pain.  Neurological: Positive for dizziness. Negative for weakness and numbness.  Psychiatric/Behavioral: Positive for confusion, decreased concentration and altered mental status. Negative for suicidal ideas, hallucinations, sleep disturbance and self-injury. The patient is nervous/anxious.   All other systems reviewed and are negative.    Allergies  Codeine; Meloxicam; Morphine and related; Naproxen; and Sulfonamide derivatives  Home Medications   Current Outpatient Rx  Name  Route  Sig  Dispense  Refill  . ARIPiprazole (ABILIFY) 5  MG tablet   Oral   Take 10 mg by mouth daily.          Marland Kitchen buPROPion (WELLBUTRIN XL) 300 MG 24 hr tablet   Oral   Take 300 mg by mouth daily.         . clonazePAM (KLONOPIN) 0.5 MG tablet   Oral   Take 0.5 mg by mouth 2 (two) times daily. Scheduled. For anxiety         . clotrimazole-betamethasone (LOTRISONE) cream   Topical   Apply 1 application topically daily as needed (for skin breaks).         . cyclobenzaprine (FLEXERIL) 5 MG tablet   Oral   Take 1 tablet (5 mg total) by mouth 3  (three) times daily as needed for muscle spasms (TMJ pain).   90 tablet   1   . divalproex (DEPAKOTE ER) 500 MG 24 hr tablet   Oral   Take 1,000 mg by mouth at bedtime.         . fluticasone (FLONASE) 50 MCG/ACT nasal spray   Nasal   Place 2 sprays into the nose daily. Dispense 1 bottle   16 g   1   . gabapentin (NEURONTIN) 300 MG capsule   Oral   Take 300 mg by mouth 3 (three) times daily.         Marland Kitchen levothyroxine (SYNTHROID, LEVOTHROID) 88 MCG tablet   Oral   Take 88 mcg by mouth daily.         Marland Kitchen liothyronine (CYTOMEL) 5 MCG tablet   Oral   Take 5 mcg by mouth daily.         . naproxen sodium (ANAPROX) 220 MG tablet   Oral   Take 220 mg by mouth 2 (two) times daily as needed (for pain).         . simvastatin (ZOCOR) 40 MG tablet   Oral   Take 1 tablet (40 mg total) by mouth every morning.   30 tablet   11   . traZODone (DESYREL) 100 MG tablet   Oral   Take 200 mg by mouth at bedtime.          . cephALEXin (KEFLEX) 250 MG capsule   Oral   Take 1 capsule (250 mg total) by mouth 4 (four) times daily.   28 capsule   0     BP 134/75  Pulse 96  Temp(Src) 98.6 F (37 C) (Oral)  Resp 18  Ht 5\' 6"  (1.676 m)  Wt 195 lb (88.451 kg)  BMI 31.49 kg/m2  SpO2 99%  Physical Exam  Nursing note and vitals reviewed. Constitutional: She is oriented to person, place, and time. She appears well-developed and well-nourished.  HENT:  Head: Normocephalic and atraumatic.  Right Ear: External ear normal.  Left Ear: External ear normal.  Eyes: Pupils are equal, round, and reactive to light.  Neck: Normal range of motion.  Cardiovascular: Normal rate and regular rhythm.   Pulmonary/Chest: Effort normal and breath sounds normal.  Abdominal: Soft. Bowel sounds are normal. She exhibits no distension. There is no tenderness.  Musculoskeletal: Normal range of motion.  Neurological: She is alert and oriented to person, place, and time.  Skin: Skin is warm and dry.   Psychiatric: Her speech is normal and behavior is normal. Judgment normal. Cognition and memory are not impaired. She exhibits a depressed mood. She expresses no suicidal ideation. She expresses no suicidal plans. She exhibits normal recent memory and normal  remote memory.    ED Course  Procedures (including critical care time)  Labs Reviewed  COMPREHENSIVE METABOLIC PANEL - Abnormal; Notable for the following:    BUN 24 (*)    Albumin 3.3 (*)    Total Bilirubin 0.2 (*)    GFR calc non Af Amer 64 (*)    GFR calc Af Amer 74 (*)    All other components within normal limits  CBC WITH DIFFERENTIAL - Abnormal; Notable for the following:    Lymphocytes Relative 47 (*)    All other components within normal limits  URINALYSIS, ROUTINE W REFLEX MICROSCOPIC - Abnormal; Notable for the following:    Color, Urine AMBER (*)    APPearance CLOUDY (*)    Specific Gravity, Urine 1.036 (*)    Bilirubin Urine SMALL (*)    Leukocytes, UA MODERATE (*)    All other components within normal limits  URINE RAPID DRUG SCREEN (HOSP PERFORMED) - Abnormal; Notable for the following:    Amphetamines POSITIVE (*)    All other components within normal limits  URINE MICROSCOPIC-ADD ON - Abnormal; Notable for the following:    Squamous Epithelial / LPF MANY (*)    Bacteria, UA MANY (*)    All other components within normal limits  URINE CULTURE  ETHANOL  GLUCOSE, CAPILLARY  POCT PREGNANCY, URINE   No results found.   1. UTI (lower urinary tract infection)   2. Medication reaction, initial encounter       MDM   Labs reviewed.  Patient has urinary tract, infection.  Discussed this with her.  She's been given IV Rocephin will be discharged home with antibiotic.  She is to call her psychologist in the morning to discuss her medication regime before.  She takes any of her prescribed medication.  At the, increased dose        Arman Filter, NP 03/18/13 [phone]

## 2013-03-18 LAB — URINE CULTURE: Colony Count: NO GROWTH

## 2013-03-18 MED ORDER — CEPHALEXIN 250 MG PO CAPS: 250.0000 mg | ORAL_CAPSULE | Freq: Four times a day (QID) | ORAL | Status: DC

## 2013-03-18 NOTE — ED Provider Notes (Signed)
Medical screening examination/treatment/procedure(s) were performed by non-physician practitioner and as supervising physician I was immediately available for consultation/collaboration.   Glynn Octave, MD 03/18/13 [phone]

## 2013-03-19 ENCOUNTER — Telehealth: Payer: Self-pay | Admitting: Licensed Clinical Social Worker

## 2013-03-19 NOTE — Telephone Encounter (Signed)
CSW received call from [facility] requesting CSW to follow up with [name] as [name] received request from pt's neighbor, [name].  CSW placed call to [name].  Pt states she has been without PCS since September 2013 because the agency she was using were "crooked".  [name] states she would like to use [facility] for [name].  During this conversation [name] requesting CSW to speak with [name]/neighbor [phone].  [name] reports pt is disheveled and in need of care.  CSW explained since it has been several months since pt had PCS care, will need to initiate another request.  Pt has appt scheduled at [facility] on 5/28, [name] aware but has yet to set up transportation.  CSW will set up transportation for [name]'s appt and initiate PCS request.  CSW informed [name] of benefits of having a [facility] referral.  Pt in agreement and agreeable to use [name] as alternate contact.  CSW will arrange transportation for appt, make referral to [facility] and initiate PCS request. Attempted to schedule transportation.  Pt will need to provide confirmation of pharmacy transport.  CSW will inquire with pt's pharmacy.

## 2013-03-21 NOTE — Telephone Encounter (Signed)
Faxed confirmation slips to [facility], requesting [facility] to [facility] to [facility].  Placed call to [facility] this morning.  Transportation for 5/28 appt confirmed.  Pt will be picked up between 1:55-2:35pm.  Pt called and notified.  Encouraged Ms. Masella to follow up with [facility] as DSS still need confirmation slip.

## 2013-03-27 ENCOUNTER — Ambulatory Visit (INDEPENDENT_AMBULATORY_CARE_PROVIDER_SITE_OTHER): Payer: Medicaid Other | Admitting: Internal Medicine

## 2013-03-27 ENCOUNTER — Encounter: Payer: Self-pay | Admitting: Internal Medicine

## 2013-03-27 ENCOUNTER — Other Ambulatory Visit (HOSPITAL_COMMUNITY)
Admission: RE | Admit: 2013-03-27 | Discharge: 2013-03-27 | Disposition: A | Discharge status: Home / Self Care | Payer: Medicaid Other | Source: Ambulatory Visit | Attending: Internal Medicine | Admitting: Internal Medicine

## 2013-03-27 VITALS — BP 111/73 | HR 75 | Temp 97.0°F | Ht 65.0 in | Wt 197.3 lb

## 2013-03-27 DIAGNOSIS — R300 Dysuria: Secondary | ICD-10-CM

## 2013-03-27 DIAGNOSIS — L259 Unspecified contact dermatitis, unspecified cause: Secondary | ICD-10-CM

## 2013-03-27 DIAGNOSIS — E039 Hypothyroidism, unspecified: Secondary | ICD-10-CM

## 2013-03-27 DIAGNOSIS — Z113 Encounter for screening for infections with a predominantly sexual mode of transmission: Secondary | ICD-10-CM | POA: Insufficient documentation

## 2013-03-27 LAB — CBC WITH DIFFERENTIAL/PLATELET
Hemoglobin: 14.8 g/dL (ref 12.0–15.0)
Lymphocytes Relative: 50 % — ABNORMAL HIGH (ref 12–46)
Lymphs Abs: 4.3 10*3/uL — ABNORMAL HIGH (ref 0.7–4.0)
MCH: 31.8 pg (ref 26.0–34.0)
Monocytes Relative: 7 % (ref 3–12)
Neutro Abs: 3.8 10*3/uL (ref 1.7–7.7)
Neutrophils Relative %: 42 % — ABNORMAL LOW (ref 43–77)
Platelets: 194 10*3/uL (ref 150–400)
RBC: 4.66 MIL/uL (ref 3.87–5.11)
WBC: 8.8 10*3/uL (ref 4.0–10.5)

## 2013-03-27 LAB — POCT GLYCOSYLATED HEMOGLOBIN (HGB A1C): Hemoglobin A1C: 4.4

## 2013-03-27 LAB — GLUCOSE, CAPILLARY: Glucose-Capillary: 81 mg/dL (ref 70–99)

## 2013-03-27 NOTE — Patient Instructions (Signed)
General Instructions: 1. Your pain with urination may be due to a urinary tract infection, or may be caused by a kidney stone.  We are checking a urine sample today, and will call you with the results. -In the meantime, use a strainer during urination to catch any kidney stones that may exist.  2.  We are checking you for Diabetes, as discussed.  Please return for a follow-up visit in 6 months   Treatment Goals:  Goals (1 Years of Data) as of 03/27/13   None      Progress Toward Treatment Goals:  Treatment Goal 10/09/2012  Stop smoking smoking the same amount    Self Care Goals & Plans:  Self Care Goal 12/26/2012  Manage my medications take my medicines as prescribed; bring my medications to every visit  Monitor my health keep track of my blood pressure  Eat healthy foods eat baked foods instead of fried foods       Care Management & Community Referrals:  Referral 03/27/2013  Referrals made for care management support -  Referrals made to community resources none

## 2013-03-27 NOTE — Progress Notes (Signed)
HPI The patient is a 42 y.o. female with a history of Hypothyroidism (Dr. Sharl Ma), Bipolar disorder [facility] center), tobacco abuse, presenting for a routine follow-up visit.  The patient notes a 12-day history of dysuria, as well as bilateral flank pain R > L.  The patient presented to the ED for this, and was diagnosed with a UTI, given Keflex for 8 days, which she has completed.  However, she notes persistent dysuria and back pain.  She notes chills, but no fever.  The patient is currently not sexually active.  The patient also has a history of kidney stones, last kidney stone 2-3 months ago.  She notes no hematuria presently.  She notes no vaginal burning, itching, or discharge.  The patient recently started using perfume.  She notes that on her bilateral arms and neck, where she placed the perfume, she has developed a red, mildly itchy rash.  ROS: General: no fevers, chills, changes in weight, changes in appetite Skin: no rash HEENT: no blurry vision, hearing changes, sore throat Pulm: no dyspnea, coughing, wheezing CV: no chest pain, palpitations, shortness of breath Abd: no abdominal pain, nausea/vomiting, diarrhea/constipation GU: see HPI Ext: no arthralgias, myalgias Neuro: no weakness, numbness, or tingling  Filed Vitals:   03/27/13 1544  BP: 111/73  Pulse: 75  Temp: 97 F (36.1 C)    PEX General: alert, cooperative, and in no apparent distress HEENT: pupils equal round and reactive to light, vision grossly intact, oropharynx clear and non-erythematous  Neck: supple, no lymphadenopathy, well-circumscribed erythematous plaques on right neck Lungs: clear to ascultation bilaterally, normal work of respiration, no wheezes, rales, ronchi Heart: regular rate and rhythm, no murmurs, gallops, or rubs Abdomen: soft, non-tender, non-distended, normal bowel sounds Back: Bilateral CVA tenderness, R > L Extremities: no cyanosis, clubbing, or edema.  Well-circumscribed erythematous  plaques on bilateral antecubital fossae Neurologic: alert & oriented X3, cranial nerves II-XII intact, strength grossly intact, sensation intact to light touch  Current Outpatient Prescriptions on File Prior to Visit  Medication Sig Dispense Refill  . ARIPiprazole (ABILIFY) 5 MG tablet Take 10 mg by mouth daily.       Marland Kitchen buPROPion (WELLBUTRIN XL) 300 MG 24 hr tablet Take 300 mg by mouth daily.      . cephALEXin (KEFLEX) 250 MG capsule Take 1 capsule (250 mg total) by mouth 4 (four) times daily.  28 capsule  0  . clonazePAM (KLONOPIN) 0.5 MG tablet Take 0.5 mg by mouth 2 (two) times daily. Scheduled. For anxiety      . clotrimazole-betamethasone (LOTRISONE) cream Apply 1 application topically daily as needed (for skin breaks).      . cyclobenzaprine (FLEXERIL) 5 MG tablet Take 1 tablet (5 mg total) by mouth 3 (three) times daily as needed for muscle spasms (TMJ pain).  90 tablet  1  . divalproex (DEPAKOTE ER) 500 MG 24 hr tablet Take 1,000 mg by mouth at bedtime.      . fluticasone (FLONASE) 50 MCG/ACT nasal spray Place 2 sprays into the nose daily. Dispense 1 bottle  16 g  1  . gabapentin (NEURONTIN) 300 MG capsule Take 300 mg by mouth 3 (three) times daily.      Marland Kitchen levothyroxine (SYNTHROID, LEVOTHROID) 88 MCG tablet Take 88 mcg by mouth daily.      Marland Kitchen liothyronine (CYTOMEL) 5 MCG tablet Take 5 mcg by mouth daily.      . naproxen sodium (ANAPROX) 220 MG tablet Take 220 mg by mouth 2 (two) times daily  as needed (for pain).      . simvastatin (ZOCOR) 40 MG tablet Take 1 tablet (40 mg total) by mouth every morning.  30 tablet  11  . traZODone (DESYREL) 100 MG tablet Take 200 mg by mouth at bedtime.        No current facility-administered medications on file prior to visit.    Assessment/Plan

## 2013-03-28 ENCOUNTER — Telehealth: Payer: Self-pay | Admitting: *Deleted

## 2013-03-28 DIAGNOSIS — L259 Unspecified contact dermatitis, unspecified cause: Secondary | ICD-10-CM | POA: Insufficient documentation

## 2013-03-28 DIAGNOSIS — R300 Dysuria: Secondary | ICD-10-CM | POA: Insufficient documentation

## 2013-03-28 LAB — URINALYSIS, COMPLETE
Crystals: NONE SEEN
Nitrite: NEGATIVE
Protein, ur: NEGATIVE mg/dL
Specific Gravity, Urine: 1.022 (ref 1.005–1.030)
Urobilinogen, UA: 0.2 mg/dL (ref 0.0–1.0)

## 2013-03-28 MED ORDER — TRIAMCINOLONE ACETONIDE 0.1 % EX CREA: TOPICAL_CREAM | Freq: Two times a day (BID) | CUTANEOUS | Status: DC

## 2013-03-28 MED ORDER — HYDROXYZINE HCL 25 MG PO TABS: 25.0000 mg | ORAL_TABLET | Freq: Four times a day (QID) | ORAL | Status: DC | PRN

## 2013-03-28 NOTE — Addendum Note (Signed)
Addended by: Linward Headland on: 03/28/2013 02:11 PM   Modules accepted: Orders

## 2013-03-28 NOTE — Telephone Encounter (Signed)
Pt called stating she was seen in the office yesterday and talked with Dr about contact dermatitis.  Today she is asking for cream and (? Antibiotic ) for this. She feels the keflex she is on has caused the rash and itching. She is having itching on neck, fingers and buttocks. She states she is miserable and needs help.  I see in your note she deferred treatment with topical corticosteroids.  Will you order today? Pt # [id number]

## 2013-03-28 NOTE — Telephone Encounter (Signed)
Rash seen yesterday was consistent with contact dermatitis.  Will prescribe triamcinolone cream, as well as hydroxyzine for itching.  I called the patient back at the number below, but a family member answered the phone, and stated the patient was not present.  I relayed the message that there would be 2 prescriptions for the patient at her pharmacy.  Will attempt to call patient back later.

## 2013-03-28 NOTE — Assessment & Plan Note (Signed)
The patient notes a 12-day history of dysuria and bilateral flank pain R > L.  In the ED on 5/18, UA showed 21-50 WBC's, many squams, and her urine culture showed no growth.  UA also showed no hemoglobin.  The patient was afebrile today and on prior ED visit.  At this point, the cause of the patient's symptoms is unclear, with suspicion for UTI and nephrolithiasis, though without definite supporting evidence on labs.  Pyelo is very unlikely, despite flank pain, given negative urine culture, absence of fever, and absence of nausea/vomiting. -recheck UA, urine culture -check GC/chlamydia -informed patient to watch for hematuria, and to strain urine -if unable to identify a cause, will consider referral to Urology

## 2013-03-28 NOTE — Assessment & Plan Note (Addendum)
The patient presents with a red, itchy rash at the sites of application of a new perfume, consistent with contact dermatitis.  She states that symptoms are mild. -discontinue the offending agent  -(phone note 5/29) - patient calls stating rash has worsened.  Will prescribe triamcinolone cream for rash, and hydroxyzine for itching

## 2013-03-29 LAB — URINE CULTURE
Colony Count: NO GROWTH
Organism ID, Bacteria: NO GROWTH

## 2013-03-29 NOTE — Telephone Encounter (Signed)
Called patient for the 3rd time at the number [phone], but unable to reach her.  Prescriptions for triamcinolone cream and hydroxyzine have been sent to her pharmacy.  Will follow-up as needed.

## 2013-04-01 NOTE — Progress Notes (Signed)
Case discussed with Dr. Brown (at time of visit, soon after the resident saw the patient).  We reviewed the resident's history and exam and pertinent patient test results.  I agree with the assessment, diagnosis, and plan of care documented in the resident's note. 

## 2013-04-03 ENCOUNTER — Encounter: Payer: Medicaid Other | Admitting: Internal Medicine

## 2013-04-04 ENCOUNTER — Other Ambulatory Visit: Payer: Self-pay | Admitting: Internal Medicine

## 2013-04-04 MED ORDER — PANTOPRAZOLE SODIUM 40 MG PO TBEC: 40.0000 mg | DELAYED_RELEASE_TABLET | Freq: Every day | ORAL | Status: DC

## 2013-04-11 ENCOUNTER — Telehealth: Payer: Self-pay | Admitting: *Deleted

## 2013-04-11 NOTE — Telephone Encounter (Signed)
Pt calls and states her navel has been swollen, red, warmer to touch x 3 days. She states it is tight where it is swollen, denies drainage. States it is tender. She is advised to go now to urg care and states she will need to wait until sat morning due to transportation issues, she is advised to go asap

## 2013-04-11 NOTE — Telephone Encounter (Signed)
I agree.  Our clinic is closed for the next 3 days, the patient should seek care at Urgent Care.  If she has not done so by Monday, we could make her an appointment in our clinic, but I believe she should be seen before then.

## 2013-04-16 NOTE — Telephone Encounter (Signed)
Thank you for following up.

## 2013-04-16 NOTE — Telephone Encounter (Signed)
i saw that pt did not seek treatment at a [facility], i called her and she stated her mom told her to put peroxide on it and keep it dry, she did and it has healed, she states she does not need an appt at this time

## 2013-05-02 ENCOUNTER — Encounter: Payer: Medicaid Other | Admitting: Internal Medicine

## 2013-05-16 ENCOUNTER — Encounter: Payer: Medicaid Other | Admitting: Internal Medicine

## 2013-05-24 NOTE — Addendum Note (Signed)
Addended by: Neomia Dear on: 05/24/2013 05:50 PM   Modules accepted: Orders

## 2013-05-30 ENCOUNTER — Encounter: Payer: Medicaid Other | Admitting: Internal Medicine

## 2013-06-12 ENCOUNTER — Telehealth: Payer: Self-pay | Admitting: *Deleted

## 2013-06-12 NOTE — Telephone Encounter (Signed)
Pt called - c/o of feeling depressed - teeth pulled 05/25/13. Will get denture 07/2013. Does not want to go out in public. Suggest wearing mask - tell people she has allergies problems. She can also wear a scarf - arrange to cover mouth area. Two mask given to pt to try. Living on liquids. Living with mother for now. Will call clinic if pt needs to come in to discuss depression. Caedan Sumler RN 8.13.14 4PM

## 2013-07-04 ENCOUNTER — Emergency Department (HOSPITAL_COMMUNITY)
Admission: EM | Admit: 2013-07-04 | Discharge: 2013-07-04 | Disposition: A | Discharge status: Home / Self Care | Payer: Medicaid Other | Source: Home / Self Care

## 2013-07-04 ENCOUNTER — Encounter (HOSPITAL_COMMUNITY): Payer: Self-pay | Admitting: Emergency Medicine

## 2013-07-04 DIAGNOSIS — S335XXA Sprain of ligaments of lumbar spine, initial encounter: Secondary | ICD-10-CM

## 2013-07-04 DIAGNOSIS — S39012A Strain of muscle, fascia and tendon of lower back, initial encounter: Secondary | ICD-10-CM

## 2013-07-04 DIAGNOSIS — N390 Urinary tract infection, site not specified: Secondary | ICD-10-CM

## 2013-07-04 LAB — POCT URINALYSIS DIP (DEVICE)
Glucose, UA: 100 mg/dL — AB
Ketones, ur: 15 mg/dL — AB
Specific Gravity, Urine: >=1.03 (ref 1.005–1.030)

## 2013-07-04 MED ORDER — PHENAZOPYRIDINE HCL 200 MG PO TABS: 200.0000 mg | ORAL_TABLET | Freq: Three times a day (TID) | ORAL | Status: DC

## 2013-07-04 MED ORDER — CEPHALEXIN 500 MG PO CAPS: 500.0000 mg | ORAL_CAPSULE | Freq: Four times a day (QID) | ORAL | Status: DC

## 2013-07-04 NOTE — ED Provider Notes (Signed)
Medical screening examination/treatment/procedure(s) were performed by a resident physician or non-physician practitioner and as the supervising physician I was immediately available for consultation/collaboration.  Randalyn Ahmed, MD   Makani Seckman S Deeric Cruise, MD 07/04/13 1713 

## 2013-07-04 NOTE — ED Notes (Signed)
Pt c/o urgency, dysuria, foul odor with urination and lower mid back pain x 2 days. Pt denies discharge. Jan Ranson, SMA

## 2013-07-04 NOTE — ED Provider Notes (Signed)
CSN: [id number]     Arrival date & time 07/04/13  1542 History   First MD Initiated Contact with Patient 07/04/13 1601     Chief Complaint  Patient presents with  . Dysuria   (Consider location/radiation/quality/duration/timing/severity/associated sxs/prior Treatment) HPI Comments: 42 year old female presents with "I got a UTI" complaint. Complains of dysuria, urinary urgency and frequency for one day. Subsequent complaining of pain in the right low back for 3 days it is worse with movement and rotation at the waist.   Past Medical History  Diagnosis Date  . Deep venous thrombosis of leg 2012    completed year of coumadin   . Hx of measles   . Migraines   . Monilia infection 12/31/10  . Vaginal atrophy 03/04/11  . Dyspareunia 03/04/11  . Bipolar 1 disorder     Followed by Mental Health.  All psychiatric medications prescribed by Mental Health.  Stable for many years.    . Hypothyroidism     Hypothyroidism since 8th grade.  Managed by Dr. Sharl Ma, Endocrinology.  On synthroid.  No prior thyroid surgery/ablation.    Past Surgical History  Procedure Laterality Date  . 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 Cesarean section  1998  . Tonsillectomy    . Wisdom tooth extraction    . Cholecystectomy    . Left elbow    . Abdominal hysterectomy  2000  . Appendectomy     Family History  Problem Relation Age of Onset  . Cancer Maternal Aunt    History  Substance Use Topics  . Smoking status: Former Smoker -- 0.50 packs/day for 25 years    Types: Cigarettes    Quit date: 10/22/2012  . Smokeless tobacco: Never Used  . Alcohol Use: No   OB History   Grav Para Term Preterm Abortions TAB SAB Ect Mult Living   1 1 1       1      Review of Systems  Constitutional: Negative.   Respiratory: Negative for shortness of breath.   Gastrointestinal: Positive for nausea.  Genitourinary: Positive for dysuria and frequency.  Neurological: Negative.     Allergies  Codeine; Meloxicam;  Morphine and related; Naproxen; and Sulfonamide derivatives  Home Medications   Current Outpatient Rx  Name  Route  Sig  Dispense  Refill  . ARIPiprazole (ABILIFY) 5 MG tablet   Oral   Take 10 mg by mouth daily.          Marland Kitchen buPROPion (WELLBUTRIN XL) 300 MG 24 hr tablet   Oral   Take 300 mg by mouth daily.         . cephALEXin (KEFLEX) 250 MG capsule   Oral   Take 1 capsule (250 mg total) by mouth 4 (four) times daily.   28 capsule   0   . cephALEXin (KEFLEX) 500 MG capsule   Oral   Take 1 capsule (500 mg total) by mouth 4 (four) times daily.   28 capsule   0   . clonazePAM (KLONOPIN) 0.5 MG tablet   Oral   Take 0.5 mg by mouth 2 (two) times daily. Scheduled. For anxiety         . clotrimazole-betamethasone (LOTRISONE) cream   Topical   Apply 1 application topically daily as needed (for skin breaks).         . cyclobenzaprine (FLEXERIL) 5 MG tablet   Oral   Take 1 tablet (5 mg total) by mouth 3 (three)  times daily as needed for muscle spasms (TMJ pain).   90 tablet   1   . divalproex (DEPAKOTE ER) 500 MG 24 hr tablet   Oral   Take 1,000 mg by mouth at bedtime.         . fluticasone (FLONASE) 50 MCG/ACT nasal spray   Nasal   Place 2 sprays into the nose daily. Dispense 1 bottle   16 g   1   . gabapentin (NEURONTIN) 300 MG capsule   Oral   Take 300 mg by mouth 3 (three) times daily.         . hydrOXYzine (ATARAX/VISTARIL) 25 MG tablet   Oral   Take 1 tablet (25 mg total) by mouth every 6 (six) hours as needed for itching.   60 tablet   0   . levothyroxine (SYNTHROID, LEVOTHROID) 88 MCG tablet   Oral   Take 88 mcg by mouth daily.         Marland Kitchen liothyronine (CYTOMEL) 5 MCG tablet   Oral   Take 5 mcg by mouth daily.         . naproxen sodium (ANAPROX) 220 MG tablet   Oral   Take 220 mg by mouth 2 (two) times daily as needed (for pain).         . pantoprazole (PROTONIX) 40 MG tablet   Oral   Take 1 tablet (40 mg total) by mouth daily.    30 tablet   11   . phenazopyridine (PYRIDIUM) 200 MG tablet   Oral   Take 1 tablet (200 mg total) by mouth 3 (three) times daily.   6 tablet   0   . simvastatin (ZOCOR) 40 MG tablet   Oral   Take 1 tablet (40 mg total) by mouth every morning.   30 tablet   11   . traZODone (DESYREL) 100 MG tablet   Oral   Take 200 mg by mouth at bedtime.          . triamcinolone cream (KENALOG) 0.1 %   Topical   Apply topically 2 (two) times daily. To red, itchy areas on neck and arms.  Do not use for more than 2 weeks   45 g   0    BP 100/69  Pulse 97  Temp(Src) 97.2 F (36.2 C) (Oral)  Resp 14  SpO2 96% Physical Exam  Nursing note and vitals reviewed. Constitutional: She is oriented to person, place, and time. She appears well-developed and well-nourished. No distress.  Eyes: EOM are normal.  Neck: Neck supple.  Cardiovascular: Normal rate, regular rhythm and normal heart sounds.   Pulmonary/Chest: Effort normal and breath sounds normal. No respiratory distress. She has no wheezes. She has no rales.  Abdominal: Soft. There is tenderness.  Mild generalized abdominal tenderness for which he says is chronic intermittent.  Musculoskeletal:  Tenderness to the right low back para lumbar musculature.  Neurological: She is alert and oriented to person, place, and time.  Skin: Skin is warm and dry.  Psychiatric: Her mood appears anxious. She is agitated. Cognition and memory are impaired.    ED Course  Procedures (including critical care time) Labs Review Labs Reviewed  POCT URINALYSIS DIP (DEVICE) - Abnormal; Notable for the following:    Glucose, UA 100 (*)    Bilirubin Urine SMALL (*)    Ketones, ur 15 (*)    Hgb urine dipstick LARGE (*)    Protein, ur >=300 (*)    Leukocytes, UA  TRACE (*)    All other components within normal limits  URINE CULTURE   Imaging Review No results found.  MDM   1. UTI (lower urinary tract infection)   2. Lumbar strain, initial encounter      Keflex 500 milligrams 4 times a day for 7 days. Preeminent 200 mg 3 times a day when necessary UTI discomfort. #6.  Drink plenty of fluids. This is very important. Heat to your right back muscle. Instructions on muscle skeletal back pain and UTI.     Hayden Rasmussen, NP 07/04/13 1625

## 2013-07-08 LAB — URINE CULTURE
Colony Count: 95000
Special Requests: NORMAL

## 2013-07-08 NOTE — ED Notes (Signed)
Urine culture: 95,000 colonies Klebsiella Pneumoniae.  Pt. adequately treaeted with Keflex. Vassie Moselle 07/08/2013

## 2013-07-25 ENCOUNTER — Other Ambulatory Visit: Payer: Self-pay | Admitting: Internal Medicine

## 2013-07-26 NOTE — Telephone Encounter (Signed)
Needs appointment with PCP

## 2013-07-26 NOTE — Telephone Encounter (Signed)
Called pt to tell her lancets are at pharmacy and call to sch appt PCP.

## 2013-07-27 ENCOUNTER — Other Ambulatory Visit: Payer: Self-pay | Admitting: Internal Medicine

## 2013-08-12 ENCOUNTER — Ambulatory Visit: Payer: Medicaid Other | Admitting: Internal Medicine

## 2013-08-16 ENCOUNTER — Encounter: Payer: Self-pay | Admitting: Internal Medicine

## 2013-08-16 ENCOUNTER — Telehealth: Payer: Self-pay | Admitting: Internal Medicine

## 2013-08-16 ENCOUNTER — Ambulatory Visit (INDEPENDENT_AMBULATORY_CARE_PROVIDER_SITE_OTHER): Payer: Medicaid Other | Admitting: Internal Medicine

## 2013-08-16 ENCOUNTER — Encounter (HOSPITAL_COMMUNITY): Payer: Self-pay | Admitting: Emergency Medicine

## 2013-08-16 ENCOUNTER — Emergency Department (HOSPITAL_COMMUNITY)
Admission: EM | Admit: 2013-08-16 | Discharge: 2013-08-17 | Disposition: A | Discharge status: Home / Self Care | Payer: Medicaid Other | Attending: Emergency Medicine | Admitting: Emergency Medicine

## 2013-08-16 VITALS — BP 94/73 | HR 85 | Temp 97.3°F | Ht 60.0 in | Wt 158.1 lb

## 2013-08-16 DIAGNOSIS — R110 Nausea: Secondary | ICD-10-CM | POA: Insufficient documentation

## 2013-08-16 DIAGNOSIS — R300 Dysuria: Secondary | ICD-10-CM

## 2013-08-16 DIAGNOSIS — Z9089 Acquired absence of other organs: Secondary | ICD-10-CM | POA: Insufficient documentation

## 2013-08-16 DIAGNOSIS — Z87891 Personal history of nicotine dependence: Secondary | ICD-10-CM | POA: Insufficient documentation

## 2013-08-16 DIAGNOSIS — R630 Anorexia: Secondary | ICD-10-CM | POA: Insufficient documentation

## 2013-08-16 DIAGNOSIS — Z792 Long term (current) use of antibiotics: Secondary | ICD-10-CM | POA: Insufficient documentation

## 2013-08-16 DIAGNOSIS — Z3202 Encounter for pregnancy test, result negative: Secondary | ICD-10-CM | POA: Insufficient documentation

## 2013-08-16 DIAGNOSIS — G43909 Migraine, unspecified, not intractable, without status migrainosus: Secondary | ICD-10-CM | POA: Insufficient documentation

## 2013-08-16 DIAGNOSIS — Z8619 Personal history of other infectious and parasitic diseases: Secondary | ICD-10-CM | POA: Insufficient documentation

## 2013-08-16 DIAGNOSIS — Z90710 Acquired absence of both cervix and uterus: Secondary | ICD-10-CM | POA: Insufficient documentation

## 2013-08-16 DIAGNOSIS — R5381 Other malaise: Secondary | ICD-10-CM | POA: Insufficient documentation

## 2013-08-16 DIAGNOSIS — Z79899 Other long term (current) drug therapy: Secondary | ICD-10-CM | POA: Insufficient documentation

## 2013-08-16 DIAGNOSIS — N390 Urinary tract infection, site not specified: Secondary | ICD-10-CM

## 2013-08-16 DIAGNOSIS — Z23 Encounter for immunization: Secondary | ICD-10-CM

## 2013-08-16 DIAGNOSIS — Z8742 Personal history of other diseases of the female genital tract: Secondary | ICD-10-CM | POA: Insufficient documentation

## 2013-08-16 DIAGNOSIS — Z0000 Encounter for general adult medical examination without abnormal findings: Secondary | ICD-10-CM

## 2013-08-16 DIAGNOSIS — Z86718 Personal history of other venous thrombosis and embolism: Secondary | ICD-10-CM | POA: Insufficient documentation

## 2013-08-16 DIAGNOSIS — R634 Abnormal weight loss: Secondary | ICD-10-CM | POA: Insufficient documentation

## 2013-08-16 DIAGNOSIS — E039 Hypothyroidism, unspecified: Secondary | ICD-10-CM | POA: Insufficient documentation

## 2013-08-16 DIAGNOSIS — F319 Bipolar disorder, unspecified: Secondary | ICD-10-CM | POA: Insufficient documentation

## 2013-08-16 DIAGNOSIS — R109 Unspecified abdominal pain: Secondary | ICD-10-CM | POA: Insufficient documentation

## 2013-08-16 DIAGNOSIS — R42 Dizziness and giddiness: Secondary | ICD-10-CM | POA: Insufficient documentation

## 2013-08-16 DIAGNOSIS — IMO0002 Reserved for concepts with insufficient information to code with codable children: Secondary | ICD-10-CM | POA: Insufficient documentation

## 2013-08-16 HISTORY — DX: Encounter for general adult medical examination without abnormal findings: Z00.00

## 2013-08-16 LAB — URINALYSIS, ROUTINE W REFLEX MICROSCOPIC
Hgb urine dipstick: NEGATIVE
Ketones, ur: 15 mg/dL — AB
Nitrite: NEGATIVE
Protein, ur: 100 mg/dL — AB
pH: 5.5 (ref 5.0–8.0)

## 2013-08-16 LAB — COMPREHENSIVE METABOLIC PANEL
Alkaline Phosphatase: 42 U/L (ref 39–117)
BUN: 17 mg/dL (ref 6–23)
CO2: 28 mEq/L (ref 19–32)
Calcium: 9.2 mg/dL (ref 8.4–10.5)
Chloride: 104 mEq/L (ref 96–112)
Creatinine, Ser: 1.06 mg/dL (ref 0.50–1.10)
GFR calc Af Amer: 73 mL/min — ABNORMAL LOW (ref >90)
GFR calc non Af Amer: 63 mL/min — ABNORMAL LOW (ref >90)
Glucose, Bld: 73 mg/dL (ref 70–99)
Total Protein: 5.8 g/dL — ABNORMAL LOW (ref 6.0–8.3)

## 2013-08-16 LAB — CBC
HCT: 41.6 % (ref 36.0–46.0)
Hemoglobin: 14.6 g/dL (ref 12.0–15.0)
MCH: 32.2 pg (ref 26.0–34.0)
MCHC: 35.1 g/dL (ref 30.0–36.0)
MCV: 91.8 fL (ref 78.0–100.0)
Platelets: 118 K/uL — ABNORMAL LOW (ref 150–400)
RBC: 4.53 MIL/uL (ref 3.87–5.11)
RDW: 13.6 % (ref 11.5–15.5)
WBC: 6.1 K/uL (ref 4.0–10.5)

## 2013-08-16 LAB — CBC WITH DIFFERENTIAL/PLATELET
Basophils Absolute: 0 10*3/uL (ref 0.0–0.1)
Eosinophils Absolute: 0 10*3/uL (ref 0.0–0.7)
Eosinophils Relative: 0 % (ref 0–5)
HCT: 39.9 % (ref 36.0–46.0)
Hemoglobin: 14.3 g/dL (ref 12.0–15.0)
Lymphs Abs: 3.5 10*3/uL (ref 0.7–4.0)
MCH: 33.3 pg (ref 26.0–34.0)
MCV: 92.8 fL (ref 78.0–100.0)
Monocytes Absolute: 0.5 10*3/uL (ref 0.1–1.0)
Monocytes Relative: 7 % (ref 3–12)
Neutro Abs: 2.6 10*3/uL (ref 1.7–7.7)
Platelets: 99 10*3/uL — ABNORMAL LOW (ref 150–400)
RBC: 4.3 MIL/uL (ref 3.87–5.11)
RDW: 12.4 % (ref 11.5–15.5)

## 2013-08-16 LAB — URINE MICROSCOPIC-ADD ON

## 2013-08-16 LAB — LIPASE, BLOOD: Lipase: 21 U/L (ref 11–59)

## 2013-08-16 LAB — POCT PREGNANCY, URINE: Preg Test, Ur: NEGATIVE

## 2013-08-16 MED ORDER — ONDANSETRON 4 MG PO TBDP
8.0000 mg | ORAL_TABLET | Freq: Once | ORAL | Status: AC
  Administered 2013-08-16: 8 mg via ORAL
  Filled 2013-08-16: qty 2

## 2013-08-16 MED ORDER — ONDANSETRON HCL 4 MG PO TABS: 4.0000 mg | ORAL_TABLET | Freq: Three times a day (TID) | ORAL | Status: DC | PRN

## 2013-08-16 MED ORDER — SODIUM CHLORIDE 0.9 % IV BOLUS (SEPSIS)
1000.0000 mL | Freq: Once | INTRAVENOUS | Status: AC
  Administered 2013-08-16: 1000 mL via INTRAVENOUS

## 2013-08-16 MED ORDER — ONDANSETRON 4 MG PO TBDP: 4.0000 mg | ORAL_TABLET | Freq: Three times a day (TID) | ORAL | Status: DC | PRN

## 2013-08-16 MED ORDER — HYDROMORPHONE HCL PF 1 MG/ML IJ SOLN
0.5000 mg | Freq: Once | INTRAMUSCULAR | Status: AC
  Administered 2013-08-16: 0.5 mg via INTRAVENOUS
  Filled 2013-08-16: qty 1

## 2013-08-16 NOTE — Assessment & Plan Note (Signed)
Flu shot given today

## 2013-08-16 NOTE — Assessment & Plan Note (Addendum)
The patient presents with a 73-month history of dysphagia, weight loss of 40 lbs, and nausea.  The etiology of her symptoms is unclear. I believe her dysphagia may be the driving process, leading to nausea and weight loss. Patient reports history of an esophageal stricture, though I want to confirm this with records. Given her uncontrolled heartburn, esophageal stricture as a possibility.  Other conditions that may be playing a role likely include misfitting dentures, possibly medication side effects (Abilify?), and anxiety.  Also, the patient has a history of hypothyroidism, though weight loss appears to be more related to symptoms of nausea. -Referral to GI for workup of dysphagia, and consideration of barium swallow versus EGD -Patient will continue to followup with dentistry for refitting of dentures -Check TSH, CMP, CBC -Check UA, given unclear history of dysphagia, right flank pain, and incomplete treatment of her last UTI. -Zofran ODT for symptoms of nausea

## 2013-08-16 NOTE — Patient Instructions (Addendum)
General Instructions: Your weight loss is concerning. I'm most concerned about your difficulty swallowing, and symptoms of nausea.  This may be related to problems in your throat or esophagus. -we are referring you to GI to consider an endoscopy for your difficulty swallowing -we are prescribing Zofran tablets for your nausea -we are checking a urine sample today, as we as a few labs  Please return for a follow-up visit in 4 weeks.   Treatment Goals:  Goals (1 Years of Data) as of 08/16/13   None      Progress Toward Treatment Goals:  Treatment Goal 10/09/2012  Stop smoking smoking the same amount    Self Care Goals & Plans:  Self Care Goal 03/27/2013  Manage my medications take my medicines as prescribed; bring my medications to every visit  Monitor my health keep track of my blood pressure  Eat healthy foods eat baked foods instead of fried foods  Be physically active find time in my schedule  Prevent falls wear appropriate shoes    No flowsheet data found.   Care Management & Community Referrals:  Referral 08/16/2013  Referrals made for care management support -  Referrals made to community resources none

## 2013-08-16 NOTE — ED Notes (Signed)
Presents with 50 pound weight loss since may due to inability to eat, dry heaves, abdominal pain and ill fitting dentures. Inability to eat solid food began in may when pt had all teeth pulled and dentures ordered. Dentures did not fit and had to be replaced multiple times, pt states the nausea, vomiting and inability to swallow and eat solid food began at end of June. Endorses fatigue, confusion, dizziness and right sided abdominal pain since June. Pain is described as dull.  Reports hypotension, at this time BP is normal.

## 2013-08-16 NOTE — Telephone Encounter (Signed)
   Reason for call:  I received a call from [name] at 630pm indicating that she saw Dr. Manson Passey in [facility] today and she has not felt better.  She says she feels like "crap".  When I asked for more details she says she just feels dizzy and her stomach is hurting along with nausea and dry heaving.  She explained to me that her BP in [facility] today was 90/50s and that she has had significant weight loss lately.  She also goes on to explain that her abdominal pain has been ongoing for 2 weeks now, generalized, 7/10 on pain scale, and that she has not eaten ANY solid food since ~April 2014 and has been on a full liquid diet. The abdominal pain and nausea is slightly improved with zofran she claims and she does say she takes something for pain but she cannot remember the name. (appers to be taking Naproxen prn per pcp note).  She says she just cannot eat and that it hurts to swallow.   She also endorses low energy and that she was talking earlier today and felt weak and is not sure what she was saying.    Pertinent Data:   Weight 08/16/13: 158lbs and 197lbs (03/27/13)  BP 94/73 during office visit  [facility] visit today for weight loss and decreased po intake s/p tooth extractions and dysphagia.   Bipolar disorder, cessation of clonazepam 3 months prior  Hx of esophageal stricture, dilated 1 year prior (unclear if accurate, requesting [facility] records by pcp)  PE in opc: mild TTP RLQ, labs pending from today:TSH, CMP, CBC, U/A  Referred to GI for dysphagia work up  Zofran PRN for nausea  ?related to misfitting dentures, medication side-effects, or anxiety  Flu shot given today   Assessment / Plan / Recommendations:   42 year old female with PMH of GERD, hypothyroidism, Bipolar disorder, and tobacco abuse calling in tonight for feeling "bad" with complaints of nausea and abdominal pain. Recent weight loss appears to be ~40 lbs over 5 months. Unclear etiology of patient complaints but given her  hypotension and current symptoms and being Friday night with pcp office closed over the weekend, I recommended her to go to the emergency room for further evaluation and possible admission if needed.  She is hesitant about getting potentially admitted and does not think she wants to go to the ED. I again urged her to go to the emergency room for further evaluation if she is not feeling well. She explained she has no money and no ride and I recommended possibly taxi, a friend, or if nothing else, she needs to call an ambulance. She says she will try and come tonight. We also discussed staying hydrated and drinking fluids and eating as tolerated along with using her zofran as prescribed for nausea. She verbally acknowledged and voiced under my recommendation for evaluation in emergency room and agreed to do so. She thanked me for talking with her.   As always, pt is advised that if symptoms worsen or new symptoms arise, they should go to an urgent care facility or to to ER for further evaluation.  I will also forward this note to PCP and front desk to make an appointment for her to be seen again in the event that she does not follow recommendations and does not go to the emergency room tonight.    Darden Palmer, MD   08/16/2013, 6:45 PM

## 2013-08-16 NOTE — Progress Notes (Signed)
HPI The patient is a 42 y.o. female with a history of GERD, hypothyroidism, tobacco abuse, presenting for weight loss.  The patient had several of her teeth pulled in July.  Since that time, she has had poor PO intake, citing as major contributors dysphagia (no odynophagia) described as "gagging" when swallowing solid food (less so with liquid), which later led to nausea at the "smell of food".  The patient had dentures placed 8/1, requiring adjustment 3 times since that time due to pain/misfitting of the dentures.  She notes some pain in her gums with eating due to this misfitting, though this does not seem like the major cause of her distress.  She can now only eat a full liquid diet without nausea.  She notes some increased heartburn, waking up with a sour taste in her mouth.  She feels some right sided flank/RLQ pain present for the last month, along with some dysuria (didn't finish course of abx for UTI 3-4 months ago). The patient's weight has decreased from 197.3 lbs (03/27/13) to 158 lbs today.  She takes Naproxen prn, typically 2/week.  No alcohol.  No BRBPR, melena.  The patient has a history of Bipolar disorder.  Her psychiatrist stopped her clonazepam 3 months ago, which the patient is upset about.  Her next appt is Nov 11th.  The patient reports a history of esophageal stricture esophageal stricture, dilated 1 year ago, though she is a relatively poor historian so I am unclear if this is entirely accurate (called [facility] for notes).  ROS: General: no fevers, chills Skin: no rash HEENT: no blurry vision, hearing changes, sore throat Pulm: no dyspnea, coughing, wheezing CV: no chest pain, palpitations, shortness of breath Abd: see HPI GU: no dysuria, hematuria, polyuria Ext: no arthralgias, myalgias Neuro: no weakness, numbness, or tingling  Filed Vitals:   08/16/13 1341  BP: 94/73  Pulse: 85  Temp: 97.3 F (36.3 C)    PEX General: alert, appears nervous HEENT: pupils equal  round and reactive to light, vision grossly intact, oropharynx clear and non-erythematous, tongue with no exudate Neck: supple Lungs: clear to ascultation bilaterally, normal work of respiration, no wheezes, rales, ronchi.  Of note, after breathing deeply for the exam, she started hyperventilating, yelling "momma help me".  Pulse ox at this time was 100%.  Patient calmed down and could breath normally after a couple of minutes. Heart: regular rate and rhythm, no murmurs, gallops, or rubs Back: No CVA tenderness Abdomen: soft, only minimally ttp of RLQ, non-distended, no rebound tenderness or guarding normal bowel sounds Extremities: no cyanosis, clubbing, or edema Neurologic: alert & oriented X3, cranial nerves II-XII intact, strength grossly intact, sensation intact to light touch  Current Outpatient Prescriptions on File Prior to Visit  Medication Sig Dispense Refill  . ACCU-CHEK FASTCLIX LANCETS MISC TEST AS DIRECTED  102 each  11  . ARIPiprazole (ABILIFY) 5 MG tablet Take 10 mg by mouth daily.       Marland Kitchen buPROPion (WELLBUTRIN XL) 300 MG 24 hr tablet Take 300 mg by mouth daily.      . cephALEXin (KEFLEX) 250 MG capsule Take 1 capsule (250 mg total) by mouth 4 (four) times daily.  28 capsule  0  . cephALEXin (KEFLEX) 500 MG capsule Take 1 capsule (500 mg total) by mouth 4 (four) times daily.  28 capsule  0  . clonazePAM (KLONOPIN) 0.5 MG tablet Take 0.5 mg by mouth 2 (two) times daily. Scheduled. For anxiety      .  clotrimazole-betamethasone (LOTRISONE) cream Apply 1 application topically daily as needed (for skin breaks).      . cyclobenzaprine (FLEXERIL) 5 MG tablet Take 1 tablet (5 mg total) by mouth 3 (three) times daily as needed for muscle spasms (TMJ pain).  90 tablet  1  . divalproex (DEPAKOTE ER) 500 MG 24 hr tablet Take 1,000 mg by mouth at bedtime.      . fluticasone (FLONASE) 50 MCG/ACT nasal spray Place 2 sprays into the nose daily. Dispense 1 bottle  16 g  1  . gabapentin  (NEURONTIN) 300 MG capsule Take 300 mg by mouth 3 (three) times daily.      . hydrOXYzine (ATARAX/VISTARIL) 25 MG tablet Take 1 tablet (25 mg total) by mouth every 6 (six) hours as needed for itching.  60 tablet  0  . levothyroxine (SYNTHROID, LEVOTHROID) 88 MCG tablet Take 88 mcg by mouth daily.      Marland Kitchen liothyronine (CYTOMEL) 5 MCG tablet Take 5 mcg by mouth daily.      . naproxen sodium (ANAPROX) 220 MG tablet Take 220 mg by mouth 2 (two) times daily as needed (for pain).      . pantoprazole (PROTONIX) 40 MG tablet Take 1 tablet (40 mg total) by mouth daily.  30 tablet  11  . phenazopyridine (PYRIDIUM) 200 MG tablet Take 1 tablet (200 mg total) by mouth 3 (three) times daily.  6 tablet  0  . simvastatin (ZOCOR) 40 MG tablet Take 1 tablet (40 mg total) by mouth every morning.  30 tablet  11  . traZODone (DESYREL) 100 MG tablet Take 200 mg by mouth at bedtime.       . triamcinolone cream (KENALOG) 0.1 % Apply topically 2 (two) times daily. To red, itchy areas on neck and arms.  Do not use for more than 2 weeks  45 g  0   No current facility-administered medications on file prior to visit.    Assessment/Plan

## 2013-08-17 LAB — COMPLETE METABOLIC PANEL WITH GFR
Albumin: 3.3 g/dL — ABNORMAL LOW (ref 3.5–5.2)
Alkaline Phosphatase: 39 U/L (ref 39–117)
BUN: 17 mg/dL (ref 6–23)
CO2: 28 mEq/L (ref 19–32)
GFR, Est African American: 82 mL/min
GFR, Est Non African American: 71 mL/min
Glucose, Bld: 71 mg/dL (ref 70–99)
Potassium: 4 mEq/L (ref 3.5–5.3)
Sodium: 140 mEq/L (ref 135–145)
Total Protein: 5.4 g/dL — ABNORMAL LOW (ref 6.0–8.3)

## 2013-08-17 LAB — URINALYSIS, ROUTINE W REFLEX MICROSCOPIC
Hgb urine dipstick: NEGATIVE
Leukocytes, UA: NEGATIVE
Nitrite: NEGATIVE
Protein, ur: NEGATIVE mg/dL
Urobilinogen, UA: 0.2 mg/dL (ref 0.0–1.0)

## 2013-08-17 MED ORDER — CIPROFLOXACIN HCL 500 MG PO TABS: 500.0000 mg | ORAL_TABLET | Freq: Two times a day (BID) | ORAL | Status: DC

## 2013-08-17 NOTE — ED Notes (Signed)
Wasted .5mg  dilaudid with [facility]

## 2013-08-17 NOTE — ED Provider Notes (Signed)
CSN: [id number]     Arrival date & time 08/16/13  2004 History   First MD Initiated Contact with Patient 08/16/13 2302     Chief Complaint  Patient presents with  . Weight Loss   (Consider location/radiation/quality/duration/timing/severity/associated sxs/prior Treatment) HPI  This is a 42 year old female who presents with nausea, anorexia, weight loss, and abdominal pain. Patient reports symptoms since May. Patient states that she had for teeth pulled in May and has had difficulty getting appropriate fitting dentures. That said that time patient reports anorexia and nausea. She denies any vomiting but states that she dry heaves that the smell of food. She states she is unable to tolerate anything. She denies dysphasia. Patient also reports dull bilateral lower quadrant abdominal pain that is nonradiating.  Rates her pain at a 7/10. She does take tramadol home but that does not help. She saw her primary care Dr. today and was given Zofran. She was referred to GI and has an appointment at the end of the month. Patient also reports dizziness. She denies room spinning dizziness but states she is off balance.  Past Medical History  Diagnosis Date  . Deep venous thrombosis of leg 2012    completed year of coumadin   . Hx of measles   . Migraines   . Monilia infection 12/31/10  . Vaginal atrophy 03/04/11  . Dyspareunia 03/04/11  . Bipolar 1 disorder     Followed by Mental Health.  All psychiatric medications prescribed by Mental Health.  Stable for many years.    . Hypothyroidism     Hypothyroidism since 8th grade.  Managed by Dr. Sharl Ma, Endocrinology.  On synthroid.  No prior thyroid surgery/ablation.    Past Surgical History  Procedure Laterality Date  . 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 Cesarean section  1998  . Tonsillectomy    . Wisdom tooth extraction    . Cholecystectomy    . Left elbow    . Abdominal hysterectomy  2000  . Appendectomy     Family History  Problem  Relation Age of Onset  . Cancer Maternal Aunt    History  Substance Use Topics  . Smoking status: Former Smoker -- 0.50 packs/day for 25 years    Types: Cigarettes    Quit date: 10/22/2012  . Smokeless tobacco: Never Used  . Alcohol Use: No   OB History   Grav Para Term Preterm Abortions TAB SAB Ect Mult Living   1 1 1       1      Review of Systems  Constitutional: Positive for appetite change, fatigue and unexpected weight change. Negative for fever.  Respiratory: Negative for chest tightness and shortness of breath.   Gastrointestinal: Positive for nausea and abdominal pain. Negative for vomiting.  Genitourinary: Negative for dysuria.  Musculoskeletal: Negative for back pain.  Neurological: Positive for dizziness.  All other systems reviewed and are negative.    Allergies  Codeine; Meloxicam; Morphine and related; Naproxen; and Sulfonamide derivatives  Home Medications   Current Outpatient Rx  Name  Route  Sig  Dispense  Refill  . ACCU-CHEK FASTCLIX LANCETS MISC      TEST AS DIRECTED   102 each   11   . ARIPiprazole (ABILIFY) 15 MG tablet   Oral   Take 15 mg by mouth daily.         Marland Kitchen buPROPion (WELLBUTRIN XL) 150 MG 24 hr tablet   Oral   Take 150  mg by mouth daily.         . clotrimazole-betamethasone (LOTRISONE) cream   Topical   Apply 1 application topically daily as needed (for skin breaks).         . cyclobenzaprine (FLEXERIL) 5 MG tablet   Oral   Take 1 tablet (5 mg total) by mouth 3 (three) times daily as needed for muscle spasms (TMJ pain).   90 tablet   1   . divalproex (DEPAKOTE ER) 500 MG 24 hr tablet   Oral   Take 1,000 mg by mouth at bedtime.         . fluticasone (FLONASE) 50 MCG/ACT nasal spray   Nasal   Place 2 sprays into the nose daily. Dispense 1 bottle   16 g   1   . gabapentin (NEURONTIN) 300 MG capsule   Oral   Take 300 mg by mouth 3 (three) times daily.         . hydrOXYzine (ATARAX/VISTARIL) 25 MG tablet    Oral   Take 1 tablet (25 mg total) by mouth every 6 (six) hours as needed for itching.   60 tablet   0   . levothyroxine (SYNTHROID, LEVOTHROID) 88 MCG tablet   Oral   Take 88 mcg by mouth daily.         Marland Kitchen liothyronine (CYTOMEL) 5 MCG tablet   Oral   Take 5 mcg by mouth daily.         . naproxen sodium (ANAPROX) 220 MG tablet   Oral   Take 220 mg by mouth 2 (two) times daily as needed (for pain).         . ondansetron (ZOFRAN ODT) 4 MG disintegrating tablet   Oral   Take 1 tablet (4 mg total) by mouth every 8 (eight) hours as needed for nausea.   60 tablet   2   . ondansetron (ZOFRAN) 4 MG tablet   Oral   Take 1 tablet (4 mg total) by mouth every 8 (eight) hours as needed for nausea.   60 tablet   2   . pantoprazole (PROTONIX) 40 MG tablet   Oral   Take 1 tablet (40 mg total) by mouth daily.   30 tablet   11   . phenazopyridine (PYRIDIUM) 200 MG tablet   Oral   Take 1 tablet (200 mg total) by mouth 3 (three) times daily.   6 tablet   0   . simvastatin (ZOCOR) 40 MG tablet   Oral   Take 1 tablet (40 mg total) by mouth every morning.   30 tablet   11   . traMADol (ULTRAM) 50 MG tablet   Oral   Take 50 mg by mouth every 6 (six) hours as needed for pain.         . traZODone (DESYREL) 100 MG tablet   Oral   Take 200 mg by mouth at bedtime.          . traZODone (DESYREL) 150 MG tablet   Oral   Take 300 mg by mouth at bedtime.         . triamcinolone cream (KENALOG) 0.1 %   Topical   Apply topically 2 (two) times daily. To red, itchy areas on neck and arms.  Do not use for more than 2 weeks   45 g   0   . ciprofloxacin (CIPRO) 500 MG tablet   Oral   Take 1 tablet (500 mg total) by mouth every  12 (twelve) hours.   6 tablet   0    BP 115/71  Pulse 56  Temp(Src) 97.9 F (36.6 C) (Oral)  Resp 20  Wt 157 lb 8 oz (71.442 kg)  BMI 30.76 kg/m2  SpO2 98% Physical Exam  Nursing note and vitals reviewed. Constitutional: She is oriented to  person, place, and time. She appears well-developed and well-nourished. No distress.  HENT:  Head: Normocephalic and atraumatic.  MM dry  Eyes: Pupils are equal, round, and reactive to light.  Neck: Neck supple.  Cardiovascular: Normal rate, regular rhythm and normal heart sounds.   No murmur heard. Pulmonary/Chest: Effort normal and breath sounds normal. No respiratory distress. She has no wheezes.  Abdominal: Soft. Bowel sounds are normal. There is no tenderness.  Neurological: She is alert and oriented to person, place, and time.  Skin: Skin is warm and dry.  Psychiatric: She has a normal mood and affect.    ED Course  Procedures (including critical care time) Labs Review Labs Reviewed  CBC WITH DIFFERENTIAL - Abnormal; Notable for the following:    Platelets 99 (*)    Neutrophils Relative % 40 (*)    Lymphocytes Relative 53 (*)    All other components within normal limits  COMPREHENSIVE METABOLIC PANEL - Abnormal; Notable for the following:    Total Protein 5.8 (*)    Albumin 3.1 (*)    GFR calc non Af Amer 63 (*)    GFR calc Af Amer 73 (*)    All other components within normal limits  URINALYSIS, ROUTINE W REFLEX MICROSCOPIC - Abnormal; Notable for the following:    Color, Urine ORANGE (*)    APPearance CLOUDY (*)    Specific Gravity, Urine 1.038 (*)    Bilirubin Urine MODERATE (*)    Ketones, ur 15 (*)    Protein, ur 100 (*)    Leukocytes, UA MODERATE (*)    All other components within normal limits  URINE MICROSCOPIC-ADD ON - Abnormal; Notable for the following:    Squamous Epithelial / LPF MANY (*)    Bacteria, UA MANY (*)    All other components within normal limits  URINE CULTURE  LIPASE, BLOOD  POCT PREGNANCY, URINE  CG4 I-STAT (LACTIC ACID)   Imaging Review No results found.  EKG Interpretation   None       MDM   1. Urinary tract infection   2. Weight loss    This is a 42 year old female who presents with anorexia, nausea, and weight loss.  Patient was evaluated earlier today by her primary care physician and given Zofran and GI referral. Patient is nontoxic-appearing on exam her vital signs are within normal limits. She has a soft, benign abdomen. Basic labwork is notable for thrombocytopenia and a urinary tract infection which may be the explanation for the patient's lower abdominal dull pain. Patient's albumin is 3.1 which is stable from May. I discussed the results with the patient. At this time given reassuring lab work, I have low suspicion for acute intra-abdominal process given the chronic nature of the patient's symptoms. I explained that she will likely need a CT scan as an outpatient to rule out cancer and offered to do this tonight .  Patient has deferred back to her primary care physician for this. I will treat her for urinary tract infection. She was given return cautions. She will followup with her primary care physician and GI.  After history, exam, and medical workup I feel the patient  has been appropriately medically screened and is safe for discharge home. Pertinent diagnoses were discussed with the patient. Patient was given return precautions.    Shon Baton, MD 08/17/13 [phone]

## 2013-08-17 NOTE — ED Notes (Signed)
Wasted Dilaudid 0.5mg  with Denman George.

## 2013-08-17 NOTE — Progress Notes (Signed)
Case discussed with Dr. Brown at the time of the visit.  We reviewed the resident's history and exam and pertinent patient test results.  I agree with the assessment, diagnosis and plan of care documented in the resident's note. 

## 2013-08-19 ENCOUNTER — Telehealth: Payer: Self-pay | Admitting: *Deleted

## 2013-08-19 MED ORDER — LEVOTHYROXINE SODIUM 75 MCG PO TABS: 75.0000 ug | ORAL_TABLET | Freq: Every day | ORAL | Status: DC

## 2013-08-19 NOTE — Telephone Encounter (Signed)
I returned the patient's phone call.  She notes R groin pain.  She was seen in the ED, which found her to have a UTI (though UA on 10/17 was normal), and started her on cipro.  The patient notes taking Tramadol for pain, 2 pills BID, though with minimal improvement.  However, she notes her pain is somewhat improved after starting cipro.  The patient was seen in our clinic 10/17, though the majority of that visit was focused on her weight loss, and there was not enough time to fully evaluate her groin pain.  For now, the patient is instructed to continue taking cipro.  She can increase her tramadol from 100 mg BID to 100 mg up to qid.  Also, thyroid dose was decreased (see addendum to OV 10/17).  If pain persists, pt should present for re-evaluation to our clinic.  Janalyn Harder 08/19/2013, 9:31 AM

## 2013-08-19 NOTE — Assessment & Plan Note (Signed)
Addendum 10/20: TSH level at last visit was found to be 0.045.  This change is likely due to the patient's weight loss, which has decreased her synthroid dose requirement.  Will decrease synthroid from 88 mcg to 75 mcg, and plan to re-check TSH in 6 weeks.  This note will be forwarded to Dr. Sharl Ma as well, for continuity of care.

## 2013-08-19 NOTE — Telephone Encounter (Signed)
Pt calls and states she is in great pain and tramadol does not work and she needs something stronger, please call her to discuss this

## 2013-08-19 NOTE — Addendum Note (Signed)
Addended by: Linward Headland on: 08/19/2013 09:35 AM   Modules accepted: Orders

## 2013-08-20 LAB — URINE CULTURE: Colony Count: 70000

## 2013-08-21 NOTE — Progress Notes (Signed)
ED Antimicrobial Stewardship Positive Culture Follow Up   [name] is an 42 y.o. female who presented to [facility] on 08/16/2013 with a chief complaint of  Chief Complaint  Patient presents with  . Weight Loss    Recent Results (from the past 720 hour(s))  URINE CULTURE     Status: None   Collection Time    08/16/13  9:13 PM      Result Value Range Status   Specimen Description URINE, RANDOM   Final   Special Requests NONE   Final   Culture  Setup Time     Final   Value: 08/16/2013 22:50     Performed at [facility] Count     Final   Value: 70,000 COLONIES/ML     Performed at [facility]   Culture     Final   Value: KLEBSIELLA SPECIES     ESCHERICHIA COLI     Performed at [facility]   Report Status 08/20/2013 FINAL   Final   Organism ID, Bacteria KLEBSIELLA SPECIES   Final   Organism ID, Bacteria ESCHERICHIA COLI   Final    [x]  Treated with Cipro, organism resistant to prescribed antimicrobial []  Patient discharged originally without antimicrobial agent and treatment is now indicated  New antibiotic prescription: Stop Cipro  Start cephalexin 500mg  TID x 7 days  ED Provider: Junious Silk, PA   [name] 08/21/2013, 10:10 AM Infectious Diseases Pharmacist Phone# [phone]

## 2013-08-21 NOTE — ED Notes (Signed)
Post ED Visit - Positive Culture Follow-up: Successful Patient Follow-Up  Culture assessed and recommendations reviewed by: []  Wes Dulaney, Pharm.D., BCPS []  Celedonio Miyamoto, Pharm.D., BCPS []  Georgina Pillion, Pharm.D., BCPS [x]  [place], [address].D., BCPS, AAHIVP []  Estella Husk, Pharm.D., BCPS, AAHIVP  Positive Urine culture  []  Patient discharged without antimicrobial prescription and treatment is now indicated [x]  Organism is resistant to prescribed ED discharge antimicrobial []  Patient with positive blood cultures  Changes discussed with ED provider: Aleen Sells New antibiotic prescription Stop Cipro-Start Keflex 500 mg TID x 7 days needs to be called to Pharmacy  Contacted patient : Patient notified wants rx called to [facility] [phone]  Larena Sox 08/21/2013, 2:39 PM

## 2013-08-22 ENCOUNTER — Ambulatory Visit (INDEPENDENT_AMBULATORY_CARE_PROVIDER_SITE_OTHER): Payer: Medicaid Other | Admitting: Internal Medicine

## 2013-08-22 ENCOUNTER — Ambulatory Visit: Payer: Medicaid Other | Admitting: Internal Medicine

## 2013-08-22 ENCOUNTER — Encounter: Payer: Self-pay | Admitting: Internal Medicine

## 2013-08-22 VITALS — BP 96/66 | HR 89 | Temp 98.2°F | Ht 65.0 in | Wt 157.5 lb

## 2013-08-22 DIAGNOSIS — R300 Dysuria: Secondary | ICD-10-CM

## 2013-08-22 DIAGNOSIS — I951 Orthostatic hypotension: Secondary | ICD-10-CM | POA: Insufficient documentation

## 2013-08-22 DIAGNOSIS — E039 Hypothyroidism, unspecified: Secondary | ICD-10-CM

## 2013-08-22 MED ORDER — FLUDROCORTISONE ACETATE 0.1 MG PO TABS: 0.1000 mg | ORAL_TABLET | Freq: Every day | ORAL | Status: DC

## 2013-08-22 MED ORDER — HYDROCODONE-ACETAMINOPHEN 5-325 MG PO TABS: 1.0000 | ORAL_TABLET | Freq: Four times a day (QID) | ORAL | Status: DC | PRN

## 2013-08-22 NOTE — Assessment & Plan Note (Signed)
The patient continues to note symptoms of jitteriness, which could be multifactorial due to bipolar disorder and iatrogenic hyperthyroidism (TSH = 0.045, likely due to unexpected weight loss on her stable synthroid dose).  At her last visit, synthroid was decreased from 88 to 75 mcg, with no improvement in symptoms.  In an effort to improve symptoms, we will decrease her total thyroid dose today. -instructed patient to hold liothyronine (5 mcg) for now -continue synthroid 75 mcg daily

## 2013-08-22 NOTE — Assessment & Plan Note (Signed)
The patient was diagnosed with a UTI in the ED.  She was prescribed cipro, but culture revealed resistance to this medication. Keflex was prescribed, but she has not yet picked this up from her pharmacy. -pt instructed to take keflex as directed

## 2013-08-22 NOTE — Assessment & Plan Note (Addendum)
The patient notes symptoms of orthostatic hypotension (though vitals could not be correlated due to patient participation issues).  BUN checked last week showed a stable BUN and Cr, and the patient notes drinking plenty of water despite dysphagia, making hypovolemia unlikely.  I believe the cause of her orthostatic hypotension is likely due to her multiple psychiatric medications.  I do not feel comfortable stopping any of these medications with the patient's severe disease.  Instead, we will try treating the orthostatic hypotension. -start fludrocortisone, 0.1 mg daily, along with a high salt diet -norco for chin pain 2/2 fall

## 2013-08-22 NOTE — Progress Notes (Signed)
HPI The patient is a 42 y.o. female with a history of bipolar disorder, hypothyroidism, weight loss, presenting for a follow-up visit for lightheadedness.  Two days ago, the patient arose in the middle of the night, walked to the bathroom, felt lightheaded, and fell, hitting her chin on a shelf, landing on her back, also hitting her right knee (history of knee replacement 12 yrs ago, Dr. Lowell Guitar).  The patient notes continued chin pain since the fall, but no knee pain, and no difficulty walking.  The patient did not lose consciousness or hit her head.  The patient's synthroid dose was decreased at her last visit 1 week ago, but the patient notes no difference in how she is feeling.  She continues to note feelings of "jitteriness", difficulty concentrating.  The patient continues to note poor PO intake, and continues to note some dysphagia.  However, she notes drinking about 4 glasses of water per day.  The patient has an appointment with GI on 10/30 for evaluation of dysphagia.  ROS: General: no fevers, chills Skin: no rash HEENT: no blurry vision, hearing changes, sore throat Pulm: no dyspnea, coughing, wheezing CV: no chest pain, palpitations, shortness of breath Abd: no abdominal pain, nausea/vomiting, diarrhea/constipation GU: no dysuria, hematuria, polyuria Ext: no arthralgias, myalgias Neuro: no weakness, numbness, or tingling  Filed Vitals:   08/22/13 1601  BP: 96/66  Pulse: 89   Orthostatics: The patient could not tolerate the feeling of the BP cuff on her skin to complete orthostatics, but she noted lightheadedness when standing from a lying position.  PEX General: sitting in chair, becomes tearful when talking about stressors in her life HEENT: pupils equal round and reactive to light, vision grossly intact, oropharynx clear and non-erythematous, large purple contusion under patient's chin Neck: supple, no lymphadenopathy Lungs: clear to ascultation bilaterally, normal work of  respiration, no wheezes, rales, ronchi Heart: regular rate and rhythm, no murmurs, gallops, or rubs Abdomen: soft, non-tender, non-distended, normal bowel sounds Extremities: no cyanosis, clubbing, or edema Neurologic: alert & oriented X3, cranial nerves II-XII intact, strength grossly intact, sensation intact to light touch  Current Outpatient Prescriptions on File Prior to Visit  Medication Sig Dispense Refill  . ACCU-CHEK FASTCLIX LANCETS MISC TEST AS DIRECTED  102 each  11  . ARIPiprazole (ABILIFY) 15 MG tablet Take 15 mg by mouth daily.      Marland Kitchen buPROPion (WELLBUTRIN XL) 150 MG 24 hr tablet Take 150 mg by mouth daily.      . ciprofloxacin (CIPRO) 500 MG tablet Take 1 tablet (500 mg total) by mouth every 12 (twelve) hours.  6 tablet  0  . clotrimazole-betamethasone (LOTRISONE) cream Apply 1 application topically daily as needed (for skin breaks).      Marland Kitchen divalproex (DEPAKOTE ER) 500 MG 24 hr tablet Take 1,000 mg by mouth at bedtime.      . fluticasone (FLONASE) 50 MCG/ACT nasal spray Place 2 sprays into the nose daily. Dispense 1 bottle  16 g  1  . gabapentin (NEURONTIN) 300 MG capsule Take 300 mg by mouth 3 (three) times daily.      . hydrOXYzine (ATARAX/VISTARIL) 25 MG tablet Take 1 tablet (25 mg total) by mouth every 6 (six) hours as needed for itching.  60 tablet  0  . levothyroxine (SYNTHROID, LEVOTHROID) 75 MCG tablet Take 1 tablet (75 mcg total) by mouth daily.  30 tablet  11  . liothyronine (CYTOMEL) 5 MCG tablet Take 5 mcg by mouth daily.      Marland Kitchen  ondansetron (ZOFRAN ODT) 4 MG disintegrating tablet Take 1 tablet (4 mg total) by mouth every 8 (eight) hours as needed for nausea.  60 tablet  2  . ondansetron (ZOFRAN) 4 MG tablet Take 1 tablet (4 mg total) by mouth every 8 (eight) hours as needed for nausea.  60 tablet  2  . pantoprazole (PROTONIX) 40 MG tablet Take 1 tablet (40 mg total) by mouth daily.  30 tablet  11  . phenazopyridine (PYRIDIUM) 200 MG tablet Take 1 tablet (200 mg total)  by mouth 3 (three) times daily.  6 tablet  0  . simvastatin (ZOCOR) 40 MG tablet Take 1 tablet (40 mg total) by mouth every morning.  30 tablet  11  . traMADol (ULTRAM) 50 MG tablet Take 50 mg by mouth every 6 (six) hours as needed for pain.      . traZODone (DESYREL) 100 MG tablet Take 200 mg by mouth at bedtime.       . traZODone (DESYREL) 150 MG tablet Take 300 mg by mouth at bedtime.      . triamcinolone cream (KENALOG) 0.1 % Apply topically 2 (two) times daily. To red, itchy areas on neck and arms.  Do not use for more than 2 weeks  45 g  0   No current facility-administered medications on file prior to visit.    Assessment/Plan

## 2013-08-22 NOTE — Patient Instructions (Signed)
General Instructions: Your lightheadedness may be a side effect of your Bipolar medications. -we are prescribing Fludrocortisone, 1 tablet once per day.  While taking this medication, eat a high salt diet  For your thyroid disorder, continue to take Synthroid, but stop taking the Liothyronine medication for now, as we may still have you on too high of a dose.  For your chin pain, you may take Hydrocodone-acetaminophen, 1 tablet every 6 hours as needed for pain.  Please return for a follow-up visit in 2-3 weeks.   Treatment Goals:  Goals (1 Years of Data) as of 08/22/13   None      Progress Toward Treatment Goals:  Treatment Goal 08/22/2013  Stop smoking -  Prevent falls deteriorated    Self Care Goals & Plans:  Self Care Goal 03/27/2013  Manage my medications take my medicines as prescribed; bring my medications to every visit  Monitor my health keep track of my blood pressure  Eat healthy foods eat baked foods instead of fried foods  Be physically active find time in my schedule  Prevent falls wear appropriate shoes    No flowsheet data found.   Care Management & Community Referrals:  Referral 08/22/2013  Referrals made for care management support none needed  Referrals made to community resources -

## 2013-08-26 ENCOUNTER — Ambulatory Visit: Payer: Medicaid Other | Admitting: Internal Medicine

## 2013-08-26 NOTE — Progress Notes (Signed)
Case discussed with Dr. Brown at the time of the visit.  We reviewed the resident's history and exam and pertinent patient test results.  I agree with the assessment, diagnosis and plan of care documented in the resident's note. 

## 2013-08-29 NOTE — Addendum Note (Signed)
Addended by: Neomia Dear on: 08/29/2013 06:48 PM   Modules accepted: Orders

## 2013-08-30 ENCOUNTER — Telehealth: Payer: Self-pay | Admitting: *Deleted

## 2013-08-30 NOTE — Telephone Encounter (Signed)
Pt calls and states she continues to not be able to eat, states it is not a medical problem it is a "mind" problem. States she did not go to GI appt because she does not want someone doing "that" to her, she is ask what that is and answers not an endo but that other thing... Colonoscopy?... No down my throat, i cant let someone do that, you see its my mind. She is offered an appt with someone mon or Tuesday, states i cannot live like this can i? Again ask if she could come to clinic mon or tues? She answers no, i will not see anyone but dr brown, she is scheduled for dr brown's 11/12 clinic but is encouraged to please come in to see someone else, she says no, dr brown is the only one that touch me. She is encouraged to call back if she changes her mind. You may want to call her, dr brown she left this ph# [phone]

## 2013-08-30 NOTE — Telephone Encounter (Signed)
I returned the patient's call, but it went straight to voicemail.  I believe there likely is a psychiatric component to her symptoms, but I am also convinced there is a medical component.  I'm sad to hear that she did not go to her GI appointment.  I agree that she should be seen sometime next week if possible for follow-up.  If she refuses, I will see her at our scheduled office visit in 2 weeks.

## 2013-08-30 NOTE — Telephone Encounter (Signed)
done

## 2013-09-01 ENCOUNTER — Inpatient Hospital Stay (HOSPITAL_COMMUNITY)
Admission: EM | Admit: 2013-09-01 | Discharge: 2013-09-04 | DRG: 918 | Disposition: A | Discharge status: Home Health | Payer: Medicaid Other | Attending: Internal Medicine | Admitting: Internal Medicine

## 2013-09-01 ENCOUNTER — Encounter (HOSPITAL_COMMUNITY): Payer: Self-pay | Admitting: Emergency Medicine

## 2013-09-01 ENCOUNTER — Emergency Department (HOSPITAL_COMMUNITY): Payer: Medicaid Other

## 2013-09-01 DIAGNOSIS — F319 Bipolar disorder, unspecified: Secondary | ICD-10-CM | POA: Diagnosis present

## 2013-09-01 DIAGNOSIS — R42 Dizziness and giddiness: Secondary | ICD-10-CM

## 2013-09-01 DIAGNOSIS — Z720 Tobacco use: Secondary | ICD-10-CM

## 2013-09-01 DIAGNOSIS — R892 Abnormal level of other drugs, medicaments and biological substances in specimens from other organs, systems and tissues: Secondary | ICD-10-CM

## 2013-09-01 DIAGNOSIS — D696 Thrombocytopenia, unspecified: Secondary | ICD-10-CM | POA: Diagnosis present

## 2013-09-01 DIAGNOSIS — Z86718 Personal history of other venous thrombosis and embolism: Secondary | ICD-10-CM

## 2013-09-01 DIAGNOSIS — E785 Hyperlipidemia, unspecified: Secondary | ICD-10-CM | POA: Diagnosis present

## 2013-09-01 DIAGNOSIS — Z9181 History of falling: Secondary | ICD-10-CM

## 2013-09-01 DIAGNOSIS — F3130 Bipolar disorder, current episode depressed, mild or moderate severity, unspecified: Secondary | ICD-10-CM | POA: Diagnosis present

## 2013-09-01 DIAGNOSIS — J309 Allergic rhinitis, unspecified: Secondary | ICD-10-CM

## 2013-09-01 DIAGNOSIS — W19XXXA Unspecified fall, initial encounter: Secondary | ICD-10-CM | POA: Diagnosis present

## 2013-09-01 DIAGNOSIS — M549 Dorsalgia, unspecified: Secondary | ICD-10-CM

## 2013-09-01 DIAGNOSIS — K219 Gastro-esophageal reflux disease without esophagitis: Secondary | ICD-10-CM

## 2013-09-01 DIAGNOSIS — R300 Dysuria: Secondary | ICD-10-CM

## 2013-09-01 DIAGNOSIS — R296 Repeated falls: Secondary | ICD-10-CM

## 2013-09-01 DIAGNOSIS — N390 Urinary tract infection, site not specified: Secondary | ICD-10-CM | POA: Diagnosis present

## 2013-09-01 DIAGNOSIS — I951 Orthostatic hypotension: Secondary | ICD-10-CM | POA: Diagnosis present

## 2013-09-01 DIAGNOSIS — R1310 Dysphagia, unspecified: Secondary | ICD-10-CM | POA: Diagnosis present

## 2013-09-01 DIAGNOSIS — E039 Hypothyroidism, unspecified: Secondary | ICD-10-CM | POA: Diagnosis present

## 2013-09-01 DIAGNOSIS — A498 Other bacterial infections of unspecified site: Secondary | ICD-10-CM | POA: Diagnosis present

## 2013-09-01 DIAGNOSIS — Z96659 Presence of unspecified artificial knee joint: Secondary | ICD-10-CM

## 2013-09-01 DIAGNOSIS — Y92009 Unspecified place in unspecified non-institutional (private) residence as the place of occurrence of the external cause: Secondary | ICD-10-CM

## 2013-09-01 DIAGNOSIS — T50995A Adverse effect of other drugs, medicaments and biological substances, initial encounter: Principal | ICD-10-CM | POA: Diagnosis present

## 2013-09-01 DIAGNOSIS — R55 Syncope and collapse: Secondary | ICD-10-CM

## 2013-09-01 DIAGNOSIS — Z87891 Personal history of nicotine dependence: Secondary | ICD-10-CM

## 2013-09-01 DIAGNOSIS — T420X5A Adverse effect of hydantoin derivatives, initial encounter: Secondary | ICD-10-CM | POA: Diagnosis present

## 2013-09-01 DIAGNOSIS — Z79899 Other long term (current) drug therapy: Secondary | ICD-10-CM

## 2013-09-01 LAB — URINALYSIS, ROUTINE W REFLEX MICROSCOPIC
Hgb urine dipstick: NEGATIVE
Nitrite: POSITIVE — AB
Protein, ur: 30 mg/dL — AB
Urobilinogen, UA: 1 mg/dL (ref 0.0–1.0)
pH: 6 (ref 5.0–8.0)

## 2013-09-01 LAB — CBC WITH DIFFERENTIAL/PLATELET
Basophils Absolute: 0 10*3/uL (ref 0.0–0.1)
Eosinophils Absolute: 0 10*3/uL (ref 0.0–0.7)
Eosinophils Relative: 0 % (ref 0–5)
Lymphs Abs: 1.7 10*3/uL (ref 0.7–4.0)
MCH: 33 pg (ref 26.0–34.0)
MCV: 93.7 fL (ref 78.0–100.0)
Monocytes Relative: 12 % (ref 3–12)
Platelets: 97 10*3/uL — ABNORMAL LOW (ref 150–400)
RBC: 3.94 MIL/uL (ref 3.87–5.11)
RDW: 13.1 % (ref 11.5–15.5)

## 2013-09-01 LAB — POCT I-STAT, CHEM 8
Chloride: 102 mEq/L (ref 96–112)
Creatinine, Ser: 1.1 mg/dL (ref 0.50–1.10)
Hemoglobin: 12.6 g/dL (ref 12.0–15.0)
Sodium: 144 mEq/L (ref 135–145)
TCO2: 26 mmol/L (ref 0–100)

## 2013-09-01 LAB — POCT I-STAT TROPONIN I

## 2013-09-01 LAB — URINE MICROSCOPIC-ADD ON

## 2013-09-01 MED ORDER — POTASSIUM CHLORIDE CRYS ER 20 MEQ PO TBCR
40.0000 meq | EXTENDED_RELEASE_TABLET | Freq: Once | ORAL | Status: AC
  Administered 2013-09-01: 40 meq via ORAL
  Filled 2013-09-01: qty 2

## 2013-09-01 MED ORDER — METHOCARBAMOL 500 MG PO TABS
500.0000 mg | ORAL_TABLET | Freq: Once | ORAL | Status: AC
  Administered 2013-09-01: 500 mg via ORAL
  Filled 2013-09-01: qty 1

## 2013-09-01 MED ORDER — HYDROCORTISONE SOD SUCCINATE 100 MG IJ SOLR
100.0000 mg | Freq: Once | INTRAMUSCULAR | Status: AC
  Administered 2013-09-01: 100 mg via INTRAVENOUS
  Filled 2013-09-01: qty 2

## 2013-09-01 MED ORDER — SODIUM CHLORIDE 0.9 % IV BOLUS (SEPSIS)
1000.0000 mL | Freq: Once | INTRAVENOUS | Status: AC
  Administered 2013-09-01: 1000 mL via INTRAVENOUS

## 2013-09-01 MED ORDER — HYDROCODONE-ACETAMINOPHEN 5-325 MG PO TABS
2.0000 | ORAL_TABLET | Freq: Once | ORAL | Status: AC
  Administered 2013-09-01: 2 via ORAL
  Filled 2013-09-01: qty 2

## 2013-09-01 NOTE — ED Provider Notes (Signed)
Patient care assumed from Irish Elders, FNP  Patient was seen and evaluated in fast track for back pain. Back pain was secondary to a syncopal episode when patient was in the bathroom and standing up after using the toilet. Patient states primary impact was to her back and that she did not hit her head. She endorses synopizing for "a few seconds" before regaining consciousness on the floor. Per patient, this has happened to her 4 times in the past two weeks and episodes preceded by "feeling off balance". She also states she has been eating and drinking less secondary to recent dental work and new dentures. She denies bowel/bladder incontinence, saddle anesthesia, or genital or perianal numbness associated with her back pain.   Per our records, patient has been seen multiple times for similar complaints of weakness and dizziness since May 2014. She has been diagnosed with orthostatic hypotension in the past as well as UTIs which were believed to be the cause of her symptoms. She denies new changes in her medication regimen as well as being on any BP meds.  Patient on my exam is well and nontoxic appearing and in NAD. Heart RRR and lungs CTAB. Abdomen nontender. GCS 15 and patient speaks in full goal oriented sentences and moves extremities without ataxia. No focal neurologic deficits appreciated. Back pain appreciated to be paraspinal in nature; no bony deformities or step offs on palpation of midline.  Labs today, other than hypokalemia of 3.1, c/w prior work ups. Troponin 0.00 and EKG today unchanged from 04/17/2012. Patient today is hypotensive, however, not orthostatic on sitting. UA ordered and currently pending. On further chart review, question possible adrenal insufficiency as cause of symptoms; c/o of anorexia, nausea, wt loss, and abdominal pain on 08/17/13. Patient was also started on Florinef by PCP 1 week ago and patient denies taking this medication since having it prescribed to her.  Will  consult teaching service for admission and further work up for syncope.  Teaching to see and admit patient. UA pending.   Results for orders placed during the hospital encounter of 09/01/13  CBC WITH DIFFERENTIAL      Result Value Range   WBC 5.8  4.0 - 10.5 K/uL   RBC 3.94  3.87 - 5.11 MIL/uL   Hemoglobin 13.0  12.0 - 15.0 g/dL   HCT 08.6  57.8 - 46.9 %   MCV 93.7  78.0 - 100.0 fL   MCH 33.0  26.0 - 34.0 pg   MCHC 35.2  30.0 - 36.0 g/dL   RDW 62.9  52.8 - 41.3 %   Platelets 97 (*) 150 - 400 K/uL   Neutrophils Relative % 58  43 - 77 %   Neutro Abs 3.4  1.7 - 7.7 K/uL   Lymphocytes Relative 30  12 - 46 %   Lymphs Abs 1.7  0.7 - 4.0 K/uL   Monocytes Relative 12  3 - 12 %   Monocytes Absolute 0.7  0.1 - 1.0 K/uL   Eosinophils Relative 0  0 - 5 %   Eosinophils Absolute 0.0  0.0 - 0.7 K/uL   Basophils Relative 0  0 - 1 %   Basophils Absolute 0.0  0.0 - 0.1 K/uL  POCT I-STAT, CHEM 8      Result Value Range   Sodium 144  135 - 145 mEq/L   Potassium 3.1 (*) 3.5 - 5.1 mEq/L   Chloride 102  96 - 112 mEq/L   BUN 14  6 - 23  mg/dL   Creatinine, Ser 1.19  0.50 - 1.10 mg/dL   Glucose, Bld 72  70 - 99 mg/dL   Calcium, Ion 1.47 (*) 1.12 - 1.23 mmol/L   TCO2 26  0 - 100 mmol/L   Hemoglobin 12.6  12.0 - 15.0 g/dL   HCT 82.9  56.2 - 13.0 %  POCT I-STAT TROPONIN I      Result Value Range   Troponin i, poc 0.00  0.00 - 0.08 ng/mL   Comment 3            Dg Chest 2 View  09/01/2013   CLINICAL DATA:  Back pain with shortness of breath and weakness after falling.  EXAM: CHEST  2 VIEW  COMPARISON:  Chest radiographs and CT 04/05/2011.  FINDINGS: The heart size and mediastinal contours are normal. The lungs are clear. There is no pleural effusion or pneumothorax. No acute osseous findings are identified. An apparent EKG snap overlies the anterior aspect of the right 3rd rib.  IMPRESSION: No active cardiopulmonary disease.   Electronically Signed   By: Roxy Horseman M.D.   On: 09/01/2013 19:32      Date: 09/01/2013  Rate: 69  Rhythm: normal sinus rhythm  QRS Axis: normal  Intervals: normal  ST/T Wave abnormalities: nonspecific ST/T changes  Conduction Disutrbances:incomplete RBBB  Narrative Interpretation: NSR with incomplete RBBB and NSST changes; no STEMI  Old EKG Reviewed: unchanged from 04/17/2012 I have personally reviewed and interpreted this EKG   Antony Madura, PA-C 09/01/13 2244

## 2013-09-01 NOTE — ED Provider Notes (Signed)
CSN: [id number]     Arrival date & time 09/01/13  1725 History   First MD Initiated Contact with Patient 09/01/13 1752     This chart was scribed for [name], by Ladona Ridgel Day, ED scribe. This patient was seen in room TR10C/TR10C and the patient's care was started at 1752.  Chief Complaint  Patient presents with  . Fall  . Back Pain   Patient is a 42 y.o. female presenting with fall and back pain. The history is provided by the patient. No language interpreter was used.  Fall This is a recurrent problem. The current episode started 1 to 2 hours ago. The problem has not changed since onset.Pertinent negatives include no chest pain, no abdominal pain, no headaches and no shortness of breath. Nothing aggravates the symptoms. Nothing relieves the symptoms. She has tried nothing for the symptoms.  Back Pain Associated symptoms: no abdominal pain, no chest pain, no fever, no headaches and no weakness    HPI Comments: [name] is a 42 y.o. female who presents to the Emergency Department complaining of sudden onset, constant, lower to mid back pain after she fell onto her back at home today. She thinks she might have passed out "for a second" which caused her to fall (she lives at home alone). She denies any recent illnesses or other injuries (she denies any hx of back pain). She has not taken any medicines for her back pain.  She reports having frequent falls recently (last occurred 3 days ago) and is not she what is causing this. She reports not eating much as normal because she is embarrassed to have dentures. She has had previous visits to the ER for similar falling episodes and dx of orthostatic hypotension.  She reports that she has panic attacks sometimes which cause her to feel SOB and have palpitations which she states might be associated with her falls.   She also reports frequent sensation of the room spinning and has previously had inner ear problems making her feel  off-balance. She has not been on any medicines previously for this issue.   Past Medical History  Diagnosis Date  . Deep venous thrombosis of leg 2012    completed year of coumadin   . Hx of measles   . Migraines   . Monilia infection 12/31/10  . Vaginal atrophy 03/04/11  . Dyspareunia 03/04/11  . Bipolar 1 disorder     Followed by Mental Health.  All psychiatric medications prescribed by Mental Health.  Stable for many years.    . Hypothyroidism     Hypothyroidism since 8th grade.  Managed by Dr. Sharl Ma, Endocrinology.  On synthroid.  No prior thyroid surgery/ablation.    Past Surgical History  Procedure Laterality Date  . 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 Cesarean section  1998  . Tonsillectomy    . Wisdom tooth extraction    . Cholecystectomy    . Left elbow    . Abdominal hysterectomy  2000  . Appendectomy     Family History  Problem Relation Age of Onset  . Cancer Maternal Aunt    History  Substance Use Topics  . Smoking status: Former Smoker -- 0.50 packs/day for 25 years    Types: Cigarettes    Quit date: 10/22/2012  . Smokeless tobacco: Never Used  . Alcohol Use: No   OB History   Grav Para Term Preterm Abortions TAB SAB Ect Mult Living   1  1 1       1      Review of Systems  Constitutional: Negative for fever and chills.  Respiratory: Negative for shortness of breath.   Cardiovascular: Negative for chest pain.  Gastrointestinal: Negative for nausea, vomiting and abdominal pain.  Musculoskeletal: Positive for back pain (mid to lower back pain).  Neurological: Negative for weakness and headaches.  All other systems reviewed and are negative.   A complete 10 system review of systems was obtained and all systems are negative except as noted in the HPI and PMH.   Allergies  Codeine; Meloxicam; Morphine and related; Naproxen; and Sulfonamide derivatives  Home Medications   Current Outpatient Rx  Name  Route  Sig  Dispense  Refill  . ARIPiprazole  (ABILIFY) 15 MG tablet   Oral   Take 15 mg by mouth daily.         Marland Kitchen buPROPion (WELLBUTRIN XL) 150 MG 24 hr tablet   Oral   Take 150 mg by mouth daily.         . divalproex (DEPAKOTE ER) 500 MG 24 hr tablet   Oral   Take 1,000 mg by mouth at bedtime.         . fludrocortisone (FLORINEF) 0.1 MG tablet   Oral   Take 1 tablet (0.1 mg total) by mouth daily.   30 tablet   2   . gabapentin (NEURONTIN) 300 MG capsule   Oral   Take 300 mg by mouth 3 (three) times daily.         Marland Kitchen levothyroxine (SYNTHROID, LEVOTHROID) 88 MCG tablet   Oral   Take 88 mcg by mouth daily before breakfast.         . liothyronine (CYTOMEL) 5 MCG tablet   Oral   Take 5 mcg by mouth daily.         . ondansetron (ZOFRAN ODT) 4 MG disintegrating tablet   Oral   Take 1 tablet (4 mg total) by mouth every 8 (eight) hours as needed for nausea.   60 tablet   2   . pantoprazole (PROTONIX) 40 MG tablet   Oral   Take 1 tablet (40 mg total) by mouth daily.   30 tablet   11   . simvastatin (ZOCOR) 40 MG tablet   Oral   Take 1 tablet (40 mg total) by mouth every morning.   30 tablet   11   . traMADol (ULTRAM) 50 MG tablet   Oral   Take 50 mg by mouth every 6 (six) hours as needed for pain.         . traZODone (DESYREL) 100 MG tablet   Oral   Take 200 mg by mouth 2 (two) times daily.          Marland Kitchen triamcinolone cream (KENALOG) 0.1 %   Topical   Apply topically 2 (two) times daily. To red, itchy areas on neck and arms.  Do not use for more than 2 weeks   45 g   0    Triage Vitals: BP 109/64  Pulse 92  Temp(Src) 98.5 F (36.9 C)  Resp 18  Ht 5\' 5"  (1.651 m)  Wt 158 lb 1 oz (71.697 kg)  BMI 26.30 kg/m2  SpO2 99% Physical Exam  Nursing note and vitals reviewed. Constitutional: She is oriented to person, place, and time. She appears well-developed and well-nourished. No distress.  HENT:  Head: Normocephalic and atraumatic.  Eyes: EOM are normal.  Neck: Neck supple.  No tracheal  deviation present.  Cardiovascular: Normal rate, regular rhythm and normal heart sounds.   No murmur heard. Pulmonary/Chest: Effort normal and breath sounds normal. No respiratory distress. She has no wheezes. She has no rales.  Musculoskeletal: Normal range of motion.  Neurological: She is alert and oriented to person, place, and time.  Skin: Skin is warm and dry.  Psychiatric: She has a normal mood and affect. Her behavior is normal.    ED Course  Procedures (including critical care time) DIAGNOSTIC STUDIES: Oxygen Saturation is 99% on room air, normal by my interpretation.    COORDINATION OF CARE: At 610 PM Discussed treatment plan with patient which includes IV fluids, CXR, blood work, cardiac markers, EKG. Patient agrees.   Labs Review Labs Reviewed - No data to display Imaging Review No results found.  EKG Interpretation   None       MDM   1. Frequent falls   2. Syncope   3. Back pain   4. Allergic rhinitis, cause unspecified   5. Bipolar disorder, unspecified   6. Hypothyroidism   7. UTI (lower urinary tract infection)   8. Dysuria   9. Thrombocytopenia   10. Tobacco abuse   11. Drug toxicity       Transferred out of fast-track to the care of Gehrig Patras Humes-PA-C. Syncopal episode and fall, has been having more frequent falls and dizziness. Cardiac work-up, IV, EKG and admit for further evaluation.    Irish Elders, NP 09/09/13 4098  Irish Elders, NP 09/14/13 [phone]

## 2013-09-01 NOTE — ED Notes (Signed)
Pt reports frequent falls recently.  Pt lost balance and fell in bathroom approximately noon today.  Pt states her pain is 10/10 mid to lower back.

## 2013-09-01 NOTE — ED Notes (Signed)
Report given to [facility]. Pt transported to C28 via wheelchair.

## 2013-09-01 NOTE — H&P (Signed)
Date: 09/01/2013               Patient Name:  [name] MRN: [id number]  DOB: [birth year] Age / Sex: 42 y.o., female   PCP: Linward Headland, MD         Medical Service: Internal Medicine Teaching Service         Attending Physician: Dr. Kem Kays    First Contact: Dr. Yetta Barre Pager: [phone]  Second Contact: Dr. Shirlee Latch Pager: [phone]       After Hours (After 5p/  First Contact Pager: [phone]  weekends / holidays): Second Contact Pager: [phone]   Chief Complaint: fall  History of Present Illness: [name] is a 42 year old female with a PMH of Bipolar disorder, Hypothyroidism, HLD, GERD.  She presents to the [facility] after a fall at home earlier today.   She reports that she was at home earlier today when she was returning to the bathroom after having left her rings in the bathroom.  She reports on her way back to the bathroom she fell on her back, she cannot recall how she fell, denies LOC, did not hit head; she reports she has been having frequent falls over the past 1.5 months, her last fall was 4 days prior.  She occasionally feels like she sometimes has panic attacks prior to fall; she does feel dizzy after standing and reports decreased PO intake but tries to stay hydrated with juices. No nausea, vomiting but +diarrhea to the point that she cannot make it to the restroom (this has been ongoing for the last month); 3 episodes of loose stools/d, has not seen blood/dark tar in stool, but +blood in urine.  She reports she recently finished a course of Keflex antibiotics for a UTI. She takes Azo for UTIs but reports she still has dysuria. No fevers/chills. Has been compliant with meds. Has been feeling depressed lately due to frequent falls. No SI.     Meds: Current Facility-Administered Medications  Medication Dose Route Frequency Provider Last Rate Last Dose  . methocarbamol (ROBAXIN) tablet 500 mg  500 mg Oral Once Antony Madura, PA-C       Current Outpatient Prescriptions    Medication Sig Dispense Refill  . ARIPiprazole (ABILIFY) 15 MG tablet Take 15 mg by mouth daily.      Marland Kitchen buPROPion (WELLBUTRIN XL) 150 MG 24 hr tablet Take 150 mg by mouth daily.      . divalproex (DEPAKOTE ER) 500 MG 24 hr tablet Take 1,000 mg by mouth at bedtime.      . fludrocortisone (FLORINEF) 0.1 MG tablet Take 1 tablet (0.1 mg total) by mouth daily.  30 tablet  2  . gabapentin (NEURONTIN) 300 MG capsule Take 300 mg by mouth 3 (three) times daily.      Marland Kitchen levothyroxine (SYNTHROID, LEVOTHROID) 88 MCG tablet Take 88 mcg by mouth daily before breakfast.      . liothyronine (CYTOMEL) 5 MCG tablet Take 5 mcg by mouth daily.      . ondansetron (ZOFRAN ODT) 4 MG disintegrating tablet Take 1 tablet (4 mg total) by mouth every 8 (eight) hours as needed for nausea.  60 tablet  2  . pantoprazole (PROTONIX) 40 MG tablet Take 1 tablet (40 mg total) by mouth daily.  30 tablet  11  . simvastatin (ZOCOR) 40 MG tablet Take 1 tablet (40 mg total) by mouth every morning.  30 tablet  11  . traMADol (ULTRAM) 50 MG tablet Take  50 mg by mouth every 6 (six) hours as needed for pain.      . traZODone (DESYREL) 100 MG tablet Take 200 mg by mouth 2 (two) times daily.       Marland Kitchen triamcinolone cream (KENALOG) 0.1 % Apply topically 2 (two) times daily. To red, itchy areas on neck and arms.  Do not use for more than 2 weeks  45 g  0    Allergies: Allergies as of 09/01/2013 - Review Complete 09/01/2013  Allergen Reaction Noted  . Codeine    . Meloxicam Nausea Only 10/09/2012  . Morphine and related  11/22/2011  . Naproxen    . Sulfonamide derivatives  01/17/2007   Past Medical History  Diagnosis Date  . Deep venous thrombosis of leg 2012    completed year of coumadin   . Hx of measles   . Migraines   . Monilia infection 12/31/10  . Vaginal atrophy 03/04/11  . Dyspareunia 03/04/11  . Bipolar 1 disorder     Followed by Mental Health.  All psychiatric medications prescribed by Mental Health.  Stable for many years.     . Hypothyroidism     Hypothyroidism since 8th grade.  Managed by Dr. Sharl Ma, Endocrinology.  On synthroid.  No prior thyroid surgery/ablation.    Past Surgical History  Procedure Laterality Date  . 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 Cesarean section  1998  . Tonsillectomy    . Wisdom tooth extraction    . Cholecystectomy    . Left elbow    . Abdominal hysterectomy  2000  . Appendectomy     Family History  Problem Relation Age of Onset  . Cancer Maternal Aunt    History   Social History  . Marital Status: Legally Separated    Spouse Name: N/A    Number of Children: N/A  . Years of Education: N/A   Occupational History  . Not on file.   Social History Main Topics  . Smoking status: Former Smoker -- 0.50 packs/day for 25 years    Types: Cigarettes    Quit date: 10/22/2012  . Smokeless tobacco: Never Used  . Alcohol Use: No  . Drug Use: No  . Sexual Activity: Yes    Birth Control/ Protection: Surgical     Comment: hysterectomy   Other Topics Concern  . Not on file   Social History Narrative  . No narrative on file    Review of Systems: Review of Systems  Constitutional: Negative for fever, chills, weight loss and malaise/fatigue.  HENT: Positive for tinnitus. Negative for congestion, nosebleeds and sore throat.   Eyes: Negative for blurred vision, double vision and photophobia.  Respiratory: Negative for cough and shortness of breath.   Cardiovascular: Negative for chest pain, palpitations and orthopnea.  Gastrointestinal: Positive for nausea and diarrhea. Negative for heartburn, vomiting, abdominal pain, blood in stool and melena.  Genitourinary: Positive for dysuria and hematuria. Negative for frequency.  Musculoskeletal: Positive for back pain and falls.  Skin: Negative for rash.  Neurological: Positive for dizziness and weakness (lower extremity weakness). Negative for sensory change, loss of consciousness and headaches.  Psychiatric/Behavioral:  Positive for depression. Negative for suicidal ideas, memory loss and substance abuse.     Physical Exam: Blood pressure 95/62, pulse 76, temperature 98.5 F (36.9 C), resp. rate 16, height 5\' 5"  (1.651 m), weight 158 lb 1 oz (71.697 kg), SpO2 96.00%. Physical Exam  Nursing note and vitals reviewed. Constitutional:  She is oriented to person, place, and time and well-developed, well-nourished, and in no distress. No distress.  HENT:  Head: Normocephalic and atraumatic.  Eyes: EOM are normal. Pupils are equal, round, and reactive to light.  Neck: Normal range of motion.  Cardiovascular: Normal rate, regular rhythm, normal heart sounds and intact distal pulses.   No murmur heard. Pulmonary/Chest: Effort normal and breath sounds normal. No respiratory distress. She has no wheezes. She has no rales.  Abdominal: Soft. Bowel sounds are normal. She exhibits no distension. There is no tenderness. There is no rebound.  Musculoskeletal: She exhibits no edema.       Lumbar back: She exhibits tenderness (Paraspinal Tenderness T12-L1 on the right).  Neurological: She is alert and oriented to person, place, and time. She has normal strength. She displays facial symmetry. No cranial nerve deficit. She has an abnormal Straight Leg Raise Test (left). She has a normal Cerebellar Exam, a normal Finger-Nose-Finger Test, a normal Heel to [employer], a normal Romberg Test and a normal Tandem Gait Test. Coordination normal.  Reflex Scores:      Patellar reflexes are 2+ on the right side and 2+ on the left side. Skin: Skin is warm and dry. She is not diaphoretic.  Psychiatric:  Flat affect     Lab results: Basic Metabolic Panel:  Recent Labs  66/44/03 1910  NA 144  K 3.1*  CL 102  GLUCOSE 72  BUN 14  CREATININE 1.10   CBC:  Recent Labs  09/01/13 1850 09/01/13 1910  WBC 5.8  --   NEUTROABS 3.4  --   HGB 13.0 12.6  HCT 36.9 37.0  MCV 93.7  --   PLT 97*  --    Urine Drug Screen: Drugs of  Abuse     Component Value Date/Time   LABOPIA NONE DETECTED 03/17/2013 2051   COCAINSCRNUR NONE DETECTED 03/17/2013 2051   LABBENZ NONE DETECTED 03/17/2013 2051   AMPHETMU POSITIVE* 03/17/2013 2051   THCU NONE DETECTED 03/17/2013 2051   LABBARB NONE DETECTED 03/17/2013 2051    Urinalysis:  Recent Labs  09/01/13 2216  COLORURINE ORANGE*  LABSPEC 1.039*  PHURINE 6.0  GLUCOSEU NEGATIVE  HGBUR NEGATIVE  BILIRUBINUR MODERATE*  KETONESUR 15*  PROTEINUR 30*  UROBILINOGEN 1.0  NITRITE POSITIVE*  LEUKOCYTESUR MODERATE*     Imaging results:  Dg Chest 2 View  09/01/2013   CLINICAL DATA:  Back pain with shortness of breath and weakness after falling.  EXAM: CHEST  2 VIEW  COMPARISON:  Chest radiographs and CT 04/05/2011.  FINDINGS: The heart size and mediastinal contours are normal. The lungs are clear. There is no pleural effusion or pneumothorax. No acute osseous findings are identified. An apparent EKG snap overlies the anterior aspect of the right 3rd rib.  IMPRESSION: No active cardiopulmonary disease.   Electronically Signed   By: Roxy Horseman M.D.   On: 09/01/2013 19:32    Other results: EKG: NSR, rate 69, normal axis, delayed interventricular conduction, nonspecific T wave changes.  Assessment & Plan by Problem:   Frequent falls Patient has a history of frequent falls, she was not orthostatic by vital signs but subjectively she is orthostatic.  She has been prescribed florinef by her PCP due to suspicion medication induced orthostasis but has not been taking this medication.  She does not report LOC or trauma to her head during her fall, she does have some low back pain but no spinous process tenderness- will hold off any imaging of  her lumbar spine at this time.  Her dizziness is less likely vertigo given her description of her dizziness.  Her UTI could be a pontential cause however this is unlikely to have been ongoing for 1.5 months.  Hypoglycemia is a concern given her low normal  CBGs recently and her history of decreased PO intake.  Her psychiatric medications could also be sedating and causing her these frequent falls however she reports no recent changes to her psychiatric meds.  Her head CT in July 2-14 did note cerebellar volume loss, this could be a contributing factor, she denies any ETOH use. -Florinef 0.1mg  daily - IVF D5NS at 75cc, will monitor CBGs - Check B12, Thiamine, Copper, Vitamin E and Vitamin D. - Consider head MRI    Hypothyroidism -Synthroid daily - Liothronine daily    HYPERCHOLESTEROLEMIA -Continue simvastatin    DISORDER, BIPOLAR NOS -Continue home medications of Abilify, Wellbutrin, Depakote, Trazodone    GERD -Protonix 40mg     UTI (lower urinary tract infection) -Patient reports completing Keflex which recent culture showed sensitivity of E. Coli and Klebsiella Pneumo were sensitive to.  However patient continues to report dysuria and U/A is consistent with a UTI. -Ceftriaxone  Dispo: Disposition is deferred at this time, awaiting improvement of current medical problems. Anticipated discharge in approximately 1-2 day(s).   The patient does have a current PCP Linward Headland, MD) and does need an [facility] hospital follow-up appointment after discharge.  The patient does not have transportation limitations that hinder transportation to clinic appointments.  Signed: Carlynn Purl, DO 09/01/2013, 11:03 PM

## 2013-09-01 NOTE — ED Notes (Signed)
Pt reports loosing her balance and falling this afternoon, fell back onto her back and now having upper and lower back pain.

## 2013-09-02 DIAGNOSIS — D696 Thrombocytopenia, unspecified: Secondary | ICD-10-CM | POA: Diagnosis present

## 2013-09-02 DIAGNOSIS — R296 Repeated falls: Secondary | ICD-10-CM

## 2013-09-02 DIAGNOSIS — E7800 Pure hypercholesterolemia, unspecified: Secondary | ICD-10-CM

## 2013-09-02 DIAGNOSIS — N390 Urinary tract infection, site not specified: Secondary | ICD-10-CM | POA: Diagnosis present

## 2013-09-02 DIAGNOSIS — F17200 Nicotine dependence, unspecified, uncomplicated: Secondary | ICD-10-CM

## 2013-09-02 LAB — GLUCOSE, CAPILLARY
Glucose-Capillary: 103 mg/dL — ABNORMAL HIGH (ref 70–99)
Glucose-Capillary: 78 mg/dL (ref 70–99)

## 2013-09-02 LAB — CBC
Hemoglobin: 12 g/dL (ref 12.0–15.0)
MCH: 32.6 pg (ref 26.0–34.0)
MCHC: 34.3 g/dL (ref 30.0–36.0)
MCV: 95.1 fL (ref 78.0–100.0)
Platelets: 88 10*3/uL — ABNORMAL LOW (ref 150–400)
RBC: 3.68 MIL/uL — ABNORMAL LOW (ref 3.87–5.11)
RDW: 13.6 % (ref 11.5–15.5)
WBC: 3.7 10*3/uL — ABNORMAL LOW (ref 4.0–10.5)

## 2013-09-02 LAB — COMPREHENSIVE METABOLIC PANEL
AST: 9 U/L (ref 0–37)
Albumin: 2.3 g/dL — ABNORMAL LOW (ref 3.5–5.2)
Alkaline Phosphatase: 34 U/L — ABNORMAL LOW (ref 39–117)
BUN: 14 mg/dL (ref 6–23)
CO2: 25 mEq/L (ref 19–32)
Calcium: 8.2 mg/dL — ABNORMAL LOW (ref 8.4–10.5)
Creatinine, Ser: 0.81 mg/dL (ref 0.50–1.10)
GFR calc non Af Amer: 87 mL/min — ABNORMAL LOW (ref >90)
Total Bilirubin: 0.2 mg/dL — ABNORMAL LOW (ref 0.3–1.2)

## 2013-09-02 LAB — AMMONIA: Ammonia: 37 umol/L (ref 11–60)

## 2013-09-02 LAB — VALPROIC ACID LEVEL: Valproic Acid Lvl: 84.1 ug/mL (ref 50.0–100.0)

## 2013-09-02 LAB — VITAMIN B12: Vitamin B-12: 653 pg/mL (ref 211–911)

## 2013-09-02 MED ORDER — ACETAMINOPHEN 650 MG RE SUPP
975.0000 mg | Freq: Three times a day (TID) | RECTAL | Status: DC
  Filled 2013-09-02 (×9): qty 1

## 2013-09-02 MED ORDER — DEXTROSE-NACL 5-0.9 % IV SOLN
INTRAVENOUS | Status: DC
  Administered 2013-09-02 – 2013-09-04 (×5): via INTRAVENOUS

## 2013-09-02 MED ORDER — PANTOPRAZOLE SODIUM 40 MG PO TBEC
40.0000 mg | DELAYED_RELEASE_TABLET | Freq: Every day | ORAL | Status: DC
  Administered 2013-09-02 – 2013-09-04 (×3): 40 mg via ORAL
  Filled 2013-09-02 (×3): qty 1

## 2013-09-02 MED ORDER — NITROFURANTOIN MONOHYD MACRO 100 MG PO CAPS
100.0000 mg | ORAL_CAPSULE | Freq: Two times a day (BID) | ORAL | Status: DC
  Administered 2013-09-02 – 2013-09-04 (×5): 100 mg via ORAL
  Filled 2013-09-02 (×6): qty 1

## 2013-09-02 MED ORDER — LEVOTHYROXINE SODIUM 75 MCG PO TABS
75.0000 ug | ORAL_TABLET | Freq: Every day | ORAL | Status: DC
  Administered 2013-09-02 – 2013-09-04 (×3): 75 ug via ORAL
  Filled 2013-09-02 (×4): qty 1

## 2013-09-02 MED ORDER — SODIUM CHLORIDE 0.9 % IJ SOLN
3.0000 mL | Freq: Two times a day (BID) | INTRAMUSCULAR | Status: DC
  Administered 2013-09-02 (×2): 3 mL via INTRAVENOUS

## 2013-09-02 MED ORDER — DEXTROSE 5 % IV SOLN
1.0000 g | INTRAVENOUS | Status: DC
  Administered 2013-09-02: 1 g via INTRAVENOUS
  Filled 2013-09-02: qty 10

## 2013-09-02 MED ORDER — BUPROPION HCL ER (XL) 150 MG PO TB24
150.0000 mg | ORAL_TABLET | Freq: Every day | ORAL | Status: DC
  Administered 2013-09-02 – 2013-09-04 (×3): 150 mg via ORAL
  Filled 2013-09-02 (×3): qty 1

## 2013-09-02 MED ORDER — ARIPIPRAZOLE 15 MG PO TABS
15.0000 mg | ORAL_TABLET | Freq: Every day | ORAL | Status: DC
  Administered 2013-09-02 – 2013-09-04 (×3): 15 mg via ORAL
  Filled 2013-09-02 (×3): qty 1

## 2013-09-02 MED ORDER — TRAZODONE HCL 100 MG PO TABS
200.0000 mg | ORAL_TABLET | Freq: Two times a day (BID) | ORAL | Status: DC
  Administered 2013-09-02 – 2013-09-03 (×3): 200 mg via ORAL
  Filled 2013-09-02 (×4): qty 2

## 2013-09-02 MED ORDER — ENSURE COMPLETE PO LIQD
237.0000 mL | Freq: Two times a day (BID) | ORAL | Status: DC
  Administered 2013-09-02: 237 mL via ORAL

## 2013-09-02 MED ORDER — OXYCODONE HCL 5 MG PO TABS
5.0000 mg | ORAL_TABLET | Freq: Once | ORAL | Status: AC
  Administered 2013-09-02: 5 mg via ORAL
  Filled 2013-09-02: qty 1

## 2013-09-02 MED ORDER — DIVALPROEX SODIUM ER 500 MG PO TB24: 1000.0000 mg | ORAL_TABLET | Freq: Every day | ORAL | Status: DC

## 2013-09-02 MED ORDER — SIMVASTATIN 40 MG PO TABS
40.0000 mg | ORAL_TABLET | Freq: Every day | ORAL | Status: DC
  Administered 2013-09-02 – 2013-09-03 (×2): 40 mg via ORAL
  Filled 2013-09-02 (×3): qty 1

## 2013-09-02 MED ORDER — LIOTHYRONINE SODIUM 5 MCG PO TABS
5.0000 ug | ORAL_TABLET | Freq: Every day | ORAL | Status: DC
  Administered 2013-09-02 – 2013-09-04 (×3): 5 ug via ORAL
  Filled 2013-09-02 (×3): qty 1

## 2013-09-02 MED ORDER — FLUDROCORTISONE ACETATE 0.1 MG PO TABS
0.1000 mg | ORAL_TABLET | Freq: Every day | ORAL | Status: DC
  Administered 2013-09-02 – 2013-09-04 (×3): 0.1 mg via ORAL
  Filled 2013-09-02 (×3): qty 1

## 2013-09-02 MED ORDER — ACETAMINOPHEN 500 MG PO TABS
1000.0000 mg | ORAL_TABLET | Freq: Three times a day (TID) | ORAL | Status: DC
  Administered 2013-09-02 – 2013-09-04 (×8): 1000 mg via ORAL
  Filled 2013-09-02 (×10): qty 2

## 2013-09-02 MED ORDER — ONDANSETRON HCL 4 MG/2ML IJ SOLN
4.0000 mg | Freq: Four times a day (QID) | INTRAMUSCULAR | Status: DC | PRN
  Administered 2013-09-03: 4 mg via INTRAVENOUS
  Filled 2013-09-02: qty 2

## 2013-09-02 MED ORDER — LORAZEPAM 2 MG/ML IJ SOLN
0.5000 mg | Freq: Once | INTRAMUSCULAR | Status: AC
  Administered 2013-09-02: 0.5 mg via INTRAVENOUS
  Filled 2013-09-02: qty 1

## 2013-09-02 MED ORDER — METHOCARBAMOL 500 MG PO TABS
500.0000 mg | ORAL_TABLET | Freq: Once | ORAL | Status: AC
  Administered 2013-09-02: 500 mg via ORAL
  Filled 2013-09-02: qty 1

## 2013-09-02 MED ORDER — GABAPENTIN 300 MG PO CAPS
300.0000 mg | ORAL_CAPSULE | Freq: Three times a day (TID) | ORAL | Status: DC
  Administered 2013-09-02 – 2013-09-04 (×8): 300 mg via ORAL
  Filled 2013-09-02 (×9): qty 1

## 2013-09-02 MED ORDER — DIVALPROEX SODIUM ER 500 MG PO TB24
1000.0000 mg | ORAL_TABLET | Freq: Every day | ORAL | Status: DC
  Filled 2013-09-02: qty 2

## 2013-09-02 NOTE — Progress Notes (Signed)
INITIAL NUTRITION ASSESSMENT  DOCUMENTATION CODES Per approved criteria  -Not Applicable   INTERVENTION:  Ensure Complete twice daily (350 kcals, 13 gm protein per 8 fl oz bottle) RD to follow for nutrition care plan  NUTRITION DIAGNOSIS: Unintended weight loss related to unknown etiology, ? depression ? biting/chewing difficulty as evidenced by 23% weight loss  Goal: Pt to meet >/= 90% of their estimated nutrition needs   Monitor:  PO & supplemental intake, weight, labs, I/O's   Reason for Assessment: Malnutrition Screening Tool Report  42 y.o. female  Admitting Dx: Frequent falls  ASSESSMENT: Patient with PMH of bipolar disorder, hypothyroidism, GERD and HLD presented s/p fall; admitted to feeling dizzy when standing and diarrhea (was recently on ABX for UTI).  RD unable to interview patient -- sleeping upon RD visit -- per admission nutrition screen, patient has had recent weight lost without trying -- per weight readings, has had 23% weight loss since May 2014 (severe for time frame); noted comment added to Nutrition Screening: patient stated she's had trouble with her new dentures.   Patient suspects some level of malnutrition, however, unable to identify at this time.  Height: Ht Readings from Last 1 Encounters:  09/02/13 5\' 5"  (1.651 m)    Weight: Wt Readings from Last 1 Encounters:  09/02/13 152 lb (68.947 kg)    Ideal Body Weight: 125 lb  % Ideal Body Weight: 122%  Wt Readings from Last 10 Encounters:  09/02/13 152 lb (68.947 kg)  08/22/13 157 lb 8 oz (71.442 kg)  08/16/13 157 lb 8 oz (71.442 kg)  08/16/13 158 lb 1.6 oz (71.714 kg)  03/27/13 197 lb 4.8 oz (89.495 kg)  03/17/13 195 lb (88.451 kg)  12/26/12 204 lb 4.8 oz (92.67 kg)  10/09/12 198 lb 3.2 oz (89.903 kg)  08/14/12 192 lb (87.091 kg)  04/18/12 190 lb (86.183 kg)    Usual Body Weight: 197 lb  % Usual Body Weight: 77%  BMI:  Body mass index is 25.29 kg/(m^2).  Estimated Nutritional  Needs: Kcal: 1700-1900 Protein: 80-90 gm Fluid: 1.7-1.9 L  Skin: Intact  Diet Order: Dysphagia 1, thin liquids  EDUCATION NEEDS: -No education needs identified at this time   Intake/Output Summary (Last 24 hours) at 09/02/13 1517 Last data filed at 09/02/13 0700  Gross per 24 hour  Intake 416.25 ml  Output    130 ml  Net 286.25 ml    Labs:   Recent Labs Lab 09/01/13 1910 09/02/13 0445  NA 144 142  K 3.1* 4.1  CL 102 110  CO2  --  25  BUN 14 14  CREATININE 1.10 0.81  CALCIUM  --  8.2*  GLUCOSE 72 113*    CBG (last 3)   Recent Labs  09/02/13 0258  GLUCAP 103*    Scheduled Meds: . acetaminophen  1,000 mg Oral TID   Or  . acetaminophen  975 mg Rectal TID  . ARIPiprazole  15 mg Oral Daily  . buPROPion  150 mg Oral Daily  . fludrocortisone  0.1 mg Oral Daily  . gabapentin  300 mg Oral TID  . levothyroxine  75 mcg Oral QAC breakfast  . liothyronine  5 mcg Oral Daily  . nitrofurantoin (macrocrystal-monohydrate)  100 mg Oral Q12H  . pantoprazole  40 mg Oral Daily  . simvastatin  40 mg Oral QPC supper  . sodium chloride  3 mL Intravenous Q12H  . traZODone  200 mg Oral BID    Continuous Infusions: . dextrose  5 % and 0.9% NaCl 75 mL/hr at 09/02/13 0203    Past Medical History  Diagnosis Date  . Deep venous thrombosis of leg 2012    completed year of coumadin   . Hx of measles   . Migraines   . Monilia infection 12/31/10  . Vaginal atrophy 03/04/11  . Dyspareunia 03/04/11  . Bipolar 1 disorder     Followed by Mental Health.  All psychiatric medications prescribed by Mental Health.  Stable for many years.    . Hypothyroidism     Hypothyroidism since 8th grade.  Managed by Dr. Sharl Ma, Endocrinology.  On synthroid.  No prior thyroid surgery/ablation.     Past Surgical History  Procedure Laterality Date  . 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 Cesarean section  1998  . Tonsillectomy    . Wisdom tooth extraction    . Cholecystectomy    . Left  elbow    . Abdominal hysterectomy  2000  . Appendectomy      Maureen Chatters, RD, LDN Pager #: [phone] After-Hours Pager #: [phone]

## 2013-09-02 NOTE — Evaluation (Signed)
Occupational Therapy Evaluation Patient Details Name: [name] MRN: [id number] DOB: 03-Nov-1970 Today's Date: 09/02/2013 Time: 4098-1191 OT Time Calculation (min): 18 min  OT Assessment / Plan / Recommendation History of present illness 42 y.o. female admitted to [facility] on 09/01/13 with fall in the bathroom at her home.  Pt with hx bipolar disorder, hypothyroidism HL.  She was recently on antibiotics for UTI.     Clinical Impression   Pt admitted with above.  She was very lethargic during eval  With minimal participation.  She demonstrates significant balance deficits with sitting and standing (leans heavily Posteriorly) and required max verbal and tactile cueing and instruction for simple transfers.  Lt. Gaze preference noted when pt initially moved to standing and when transferring to [facility], but this was no longer present once pt sat on BSC.  Pt with poor safety awareness, and max cues/assist for problem solving.  Pt very lethargic and minimal participation so difficult to accurately assess.  Once pt moved from [facility] back to bed, she insisted on lying down and would not participate further.  Tremor noted Rt. UE (unable to assess if this is new, or present consistently).  At this time, pt is unsafe to discharge home.  She will require SNF level rehab at discharge.  Will continue to follow acutely to address the below listed deficits to increase her independence and safety with BADLs    OT Assessment  Patient needs continued OT Services    Follow Up Recommendations  SNF;Supervision/Assistance - 24 hour    Barriers to Discharge Decreased caregiver support    Equipment Recommendations  None recommended by OT    Recommendations for Other Services    Frequency  Min 2X/week    Precautions / Restrictions Precautions Precautions: Fall Precaution Comments: posterior lean in sitting and in standing.    Pertinent Vitals/Pain     ADL  Eating/Feeding: +1 Total assistance (due to level of  arrousal ) Where Assessed - Eating/Feeding: Bed level Grooming: Wash/dry hands;Wash/dry face;D[employer] care;Brushing hair;+1 Total assistance Where Assessed - Grooming: Supported sitting Upper Body Bathing: +1 Total assistance Where Assessed - Upper Body Bathing: Supine, head of bed up Lower Body Bathing: +1 Total assistance Where Assessed - Lower Body Bathing: Supported sit to stand Upper Body Dressing: +1 Total assistance Where Assessed - Upper Body Dressing: Supported sitting Lower Body Dressing: +1 Total assistance Where Assessed - Lower Body Dressing: Supported sit to Pharmacist, hospital: +2 Total assistance Toilet Transfer: Patient Percentage: 40% Statistician Method: Surveyor, minerals: Materials engineer and Hygiene: +1 Total assistance Where Assessed - Glass blower/designer Manipulation and Hygiene: Standing Transfers/Ambulation Related to ADLs: Total A +2 (pt ~40%).  pt with Lt gaze preference when transferring to Lt.  Step by step cues/assist for all aspects.  Heavy posterior lean ADL Comments: Pt unable to maintain adequate arrousal to participate.  Once she transferred back to EOB from [facility], pt insisted on lying down, and would not participate further    OT Diagnosis: Cognitive deficits;Generalized weakness;Disturbance of vision  OT Problem List: Decreased strength;Decreased activity tolerance;Impaired balance (sitting and/or standing);Impaired vision/perception;Decreased coordination;Decreased cognition;Decreased safety awareness;Decreased knowledge of use of DME or AE;Pain OT Treatment Interventions: Self-care/ADL training;Neuromuscular education;DME and/or AE instruction;Therapeutic activities;Cognitive remediation/compensation;Visual/perceptual remediation/compensation;Patient/family education;Balance training   OT Goals(Current goals can be found in the care plan section) Acute Rehab OT Goals Patient Stated Goal: Pt did not  state OT Goal Formulation: Patient unable to participate in goal setting (due to  lethargy) Time For Goal Achievement: 09/16/13 Potential to Achieve Goals: Fair ADL Goals Pt Will Perform Grooming: sitting;with min guard assist Pt Will Perform Upper Body Bathing: with min assist;sitting Pt Will Perform Lower Body Bathing: with mod assist;sit to/from stand Pt Will Perform Upper Body Dressing: with min assist;sitting Pt Will Perform Lower Body Dressing: with mod assist;sit to/from stand Pt Will Transfer to Toilet: with min assist;bedside commode;stand pivot transfer Pt Will Perform Toileting - Clothing Manipulation and hygiene: with mod assist;sit to/from stand Additional ADL Goal #1: Pt will maintain sustained attention to simple BADL tasks x 5 mins  Visit Information  Last OT Received On: 09/02/13 Assistance Needed: +2 History of Present Illness: 42 y.o. female admitted to [facility] on 09/01/13 with fall in the bathroom at her home.  Pt with hx bipolar disorder, hypothyroidism HL.  She was recently on antibiotics for UTI.         Prior Functioning     Home Living Family/patient expects to be discharged to:: Private residence Living Arrangements: Alone Available Help at Discharge: Other (Comment) Type of Home: House Home Access: Stairs to enter [employer] of Steps: 1 Entrance Stairs-Rails: None Home Layout: One level Home Equipment: Walker - 2 wheels;Bedside commode Additional Comments: Pt unable to provide further info re: home set up due to lethargy Prior Function Level of Independence: Independent with assistive device(s) Comments: sometimes uses the walker Communication Communication: Other (comment) (Pt with minimal conversation due to lethargy) Dominant Hand: Right         Vision/Perception Vision - History Baseline Vision: Wears glasses all the time Patient Visual Report: Other (comment) (pt unable to provide info) Vision - Assessment Vision Assessment:  Vision not tested Additional Comments: Pt would/could not maintain arrousal.  Lt. gaze preference when pt transferred to Lt Perception Perception: Impaired Inattention/Neglect: Does not attend to right visual field (during transfer) Praxis Praxis: Impaired Praxis Impairment Details: Motor planning Praxis-Other Comments: Pt noted with severe Lt. gaze preference when she stood to transfer to [facility].  Was not noted once she sat down.  Pt would not participate in further assesment   Cognition  Cognition Arousal/Alertness: Lethargic Behavior During Therapy: Flat affect Overall Cognitive Status: Impaired/Different from baseline Area of Impairment: Attention;Following commands;Safety/judgement;Awareness;Problem solving Current Attention Level: Focused (3 seconds) Following Commands: Follows one step commands inconsistently Safety/Judgement: Decreased awareness of safety;Decreased awareness of deficits Problem Solving: Slow processing;Decreased initiation;Difficulty sequencing;Requires verbal cues;Requires tactile cues General Comments: Pt requires mod A to maintain sitting balance, but thinks she can ambulate to BR.  Pt required step by step verbal and tactile cues for hand placement and safety during transfer.  Pt wtih diffiulty following commands possibly due to lethargy Difficult to assess due to: Level of arousal    Extremity/Trunk Assessment Upper Extremity Assessment Upper Extremity Assessment: RUE deficits/detail RUE Deficits / Details: tremor noted.  Unsure if this is new onset.  Pt grossly with AROM WFL, but pt only particpated in minimal assesment Lower Extremity Assessment Lower Extremity Assessment: Defer to PT evaluation RLE Deficits / Details: 4/5 per MMT EOB bil, no significant differences left vs right, no reports of numbness or tingling in extremities LLE Deficits / Details: 4/5 per MMT EOB bil, no significant differences left vs right, no reports of numbness or tingling in  extremities Cervical / Trunk Assessment Cervical / Trunk Assessment: Other exceptions Cervical / Trunk Exceptions: Maintains posterior pelvic tilt with Lt. Rt lateral flexion     Mobility Bed Mobility Bed Mobility: Supine to Sit;Sitting -  Scoot to [employer] of Bed;Sit to Supine Supine to Sit: 4: Min assist Sitting - Scoot to Edge of Bed: 3: Mod assist Sit to Supine: 4: Min guard Details for Bed Mobility Assistance: Pt required min A to help initiate movement/activity.  Pt with poor balance when sitting requiring mod A to maintain balance when scooting to EOB Transfers Transfers: Sit to Stand;Stand to Sit Sit to Stand: 3: Mod assist;From bed;From chair/3-in-1 Stand to Sit: 2: Max assist;With upper extremity assist;To chair/3-in-1;To bed Details for Transfer Assistance: Pt leaning heavily posterioly and to the left.  Pt makes no attempt to correct, no balance reactions noted     Exercise     Balance Balance Balance Assessed: Yes Static Sitting Balance Static Sitting - Balance Support: Bilateral upper extremity supported;Feet supported Static Sitting - Level of Assistance: 3: Mod assist Static Sitting - Comment/# of Minutes: mod A due to posterior bias/lean Static Standing Balance Static Standing - Balance Support: Bilateral upper extremity supported Static Standing - Level of Assistance: 3: Mod assist;2: Max assist Static Standing - Comment/# of Minutes: Posterior lean    End of Session OT - End of Session Activity Tolerance: Patient limited by fatigue Patient left: in bed;with call bell/phone within reach;with bed alarm set Nurse Communication: Mobility status  GO Functional Limitation: Self care Self Care Current Status (Z6109): At least 80 percent but less than 100 percent impaired, limited or restricted Self Care Goal Status (U0454): At least 40 percent but less than 60 percent impaired, limited or restricted   Jearlean Demauro M 09/02/2013, 12:29 PM

## 2013-09-02 NOTE — Progress Notes (Signed)
Physical Therapy Treatment Patient Details Name: [name] MRN: [id number] DOB: 1971/10/03 Today's Date: 09/02/2013 Time: 8295-6213 PT Time Calculation (min): 12 min  PT Assessment / Plan / Recommendation  History of Present Illness 42 y.o. female admitted to [facility] on 09/01/13 with fall in the bathroom at her home.  Pt with hx bipolar disorder, hypothyroidism HL.  She was recently on antibiotics for UTI.     PT Comments   Pt is doing much better this afternoon and was able to walk to the door and back to bed, then to the bathroom and back to the bed with min assist and RW.  She is still not safe on her feet without constant assistance to keep her from falling, so even though she did better physically, my recommendations for 24/7 assist do not change as she is still not safe to return home and care for herself.    Follow Up Recommendations  SNF;Other (comment) (she is not safe to return home alone at this point)     Does the patient have the potential to tolerate intense rehabilitation    NA  Barriers to Discharge   Decreased caregiver support      Equipment Recommendations  None recommended by PT    Recommendations for Other Services   NA  Frequency Min 3X/week   Progress towards PT Goals Progress towards PT goals: Progressing toward goals  Plan Current plan remains appropriate    Precautions / Restrictions Precautions Precautions: Fall Precaution Comments: not balanced in standing.    Pertinent Vitals/Pain See vitals flow sheet.     Mobility  Bed Mobility Bed Mobility: Supine to Sit;Sitting - Scoot to Edge of Bed;Sit to Supine Supine to Sit: 6: Modified independent (Device/Increase time);With rails;HOB elevated Sitting - Scoot to Edge of Bed: 6: Modified independent (Device/Increase time);With rail Sit to Supine: 6: Modified independent (Device/Increase time);HOB flat;With rail Details for Bed Mobility Assistance: pt was able to get up and down out of bed easily with no  signs of posterior lean in sitting this afternoon.  Transfers Transfers: Sit to Stand;Stand to Sit Sit to Stand: 4: Min assist;With upper extremity assist;With armrests;From bed;From chair/3-in-1 Stand to Sit: 4: Min assist;With upper extremity assist;With armrests;To bed;To chair/3-in-1 Details for Transfer Assistance: min assist to support trunk for balance.  Pt is still leaning posteriorly, but much less than during AM session.  RW helps as well as it brings her body forward in standing.  Ambulation/Gait Ambulation/Gait Assistance: 4: Min assist Ambulation Distance (Feet): 30 Feet (15' x 2 ) Assistive device: Rolling walker Ambulation/Gait Assistance Details: min assist to support trunk for balance as pt with mild posterior lean and now new moderate right lean while walking.   Gait Pattern: Step-through pattern;Left flexed knee in stance;Lateral trunk lean to right Gait velocity: decreased      PT Goals (current goals can now be found in the care plan section) Acute Rehab PT Goals Patient Stated Goal: to be able to care for herself.   Visit Information  Last PT Received On: 09/02/13 Assistance Needed: +1 (this fluctuates, be careful)) History of Present Illness: 42 y.o. female admitted to [facility] on 09/01/13 with fall in the bathroom at her home.  Pt with hx bipolar disorder, hypothyroidism HL.  She was recently on antibiotics for UTI.      Subjective Data  Subjective: Pt much more alert this PM, parents in room visitng.  Mother in [facility], but looks well.   Patient Stated Goal: to be  able to care for herself.    Cognition  Cognition Arousal/Alertness: Awake/alert Behavior During Therapy: WFL for tasks assessed/performed Overall Cognitive Status: Within Functional Limits for tasks assessed General Comments: MS much better this PM.  Pt was even making jokes to mom and dad, however, this switched to tears and the end of our walk.     Balance  Static Sitting Balance Static Sitting -  Balance Support: Bilateral upper extremity supported;Feet supported Static Sitting - Level of Assistance: 5: Stand by assistance Static Sitting - Comment/# of Minutes: supervision with no signs of posterior lean in sitting.   Static Standing Balance Static Standing - Balance Support: Bilateral upper extremity supported Static Standing - Level of Assistance: 5: Stand by assistance Static Standing - Comment/# of Minutes: with hands on RW and standing in bathroom and EOB supervision.   Dynamic Standing Balance Dynamic Standing - Balance Support: Right upper extremity supported;During functional activity Dynamic Standing - Level of Assistance: 4: Min assist Dynamic Standing - Comments: min guard assist in bathroom while pt preforming her own peri care and pants management.  She looks better doing these tasks with one upper extremity supported (more balanced) than she does with walking with RW with both upper extremities supported.    End of Session PT - End of Session Equipment Utilized During Treatment: Gait belt Activity Tolerance: Patient limited by fatigue;Patient limited by pain Patient left: in bed;with call bell/phone within reach;with bed alarm set;with family/visitor present Nurse Communication: Mobility status    Lurena Joiner B. Shadavia Dampier, PT, DPT [phone]   09/02/2013, 5:11 PM

## 2013-09-02 NOTE — H&P (Signed)
INTERNAL MEDICINE TEACHING SERVICE Attending Admission Note  Date: 09/02/2013  Patient name: [name]  Medical record number: [id number]  Date of birth: 10/03/1971    I have seen and evaluated Liston Alba and discussed their care with the Residency Team.  42 year old female with hx bipolar disorder, hypothyroidism HL, presented with fall. The patient denied LOC, but her history is not clear. She admits to feeling dizzy when standing. Admits to recent diarrhea. She has recently finished a course of oral abx for UTI.  There is a history of concern for medication induced orthostasis and the patient was placed on Florinef. Per history, she has not been adherent to this medication. On exam, she is drowsy but oriented. I see no focal deficits on neurological exam.  Check depakote level. Restart Florinef. Check orthostatics again. Consult PT/OT. The main concern in this patient is benefit vs risk of her psychiatric medications, which she understands.   Jonah Blue, DO, FACP Faculty [facility] Internal Medicine Residency Program 09/02/2013, 10:43 AM

## 2013-09-02 NOTE — Care Management Note (Signed)
Page 1 of 2   09/04/2013     1:57:13 PM   CARE MANAGEMENT NOTE 09/04/2013  Patient:  [name], [name]   Account Number:  [id number]  Date Initiated:  09/02/2013  Documentation initiated by:  Delanna Blacketer  Subjective/Objective Assessment:   PT ADM ON 09/01/13 WITH FREQUENT FALLS, BACK PAIN.  PTA, PT LIVES ALONE, USES RW TO AMBULATE.     Action/Plan:   ATTEMPTED TO DISCUSS DC PLANS WITH PT, BUT SHE WAS SLEEPING, AND WOULD NOT AWAKEN TO SPEAK WITH ME.  WILL FOLLOW UP.  PT/OT RECOMMENDING SNF PLACEMENT AT DC.  CSW TO FOLLOW.   Anticipated DC Date:  09/04/2013   Anticipated DC Plan:  HOME W HOME HEALTH SERVICES      DC Planning Services  CM consult      [facility] Choice  HOME HEALTH   Choice offered to / List presented to:  C-1 Patient        HH arranged  HH-1 RN  HH-2 PT  HH-3 OT  HH-4 NURSE'S AIDE      HH agency  [facility]   Status of service:  Completed, signed off Medicare Important Message given?   (If response is "NO", the following Medicare IM given date fields will be blank) Date Medicare IM given:   Date Additional Medicare IM given:    Discharge Disposition:  HOME W HOME HEALTH SERVICES  Per UR Regulation:  Reviewed for med. necessity/level of care/duration of stay  If discussed at Long Length of Stay Meetings, dates discussed:    Comments:  09/04/13 Siddharth Babington,RN,BSN [phone] MET WITH PT TO DISCUSS HH ARRANGEMENTS; REFERRAL TO [facility], PER PT CHOICE.  PT WILL LIKELY NOT GET HOME THERAPIES, AS SHE HAS MEDICAID ONLY, AND DOES NOT HAVE QUALIFYING DIAGNOSIS TO RECEIVE HOME THERAPIES.  START OF CARE 24-48H POST DC DATE.  09/03/13 Lamika Connolly,RN,BSN [phone] MET WITH PT, MOM AND STEP DAD IN ROOM, PER THEIR REQUEST, TO DISCUSS DC PLANS.  PT STATES "Y'ALL ARE TRYING TO PUT ME SOMEWHERE."  EXPLAINED TO PT THAT WE ALL JUST WANT TO MAKE SURE SHE IS SAFE.  PT STATES SHE DOES NOT WANT TO GIVE UP HER APT, AND SHE CANNOT LIVE WITH HER PARENTS.  SHE STATES THERE  IS NO ONE WHO CAN STAY WITH HER AT DC.  SHE STATES SHE HAS HAD HH IN THE PAST, BUT DESCRIBES IT AS A "DISASTER."  SHE IS TEARFUL AT TIMES, BUT ULTIMATELY REFUSES SNF, BUT WILL AGREE TO HH FOLLOW UP.  HER PARENTS BASICALLY REMAINED SILENT DURING OUR CONVERSATION, BUT UPON MY LEAVING THE ROOM STEPFATHER FOLLOWED ME OUT.  HE STATES THAT PT IS TAKING A TREMENDOUS AMT OF RX MEDICATIONS AND IS GOING TO MULTIPLE DOCTORS TO GET RX.  HE STATES SHE TAKES SO MUCH MEDICINE SHE CAN HARDLY SIT UP AFTER TAKING HER EVENING MEDS.  HE SAYS SHE HAS SOLD RX MEDS, STOLEN RX MEDS FROM HER PARENTS, AND STOLEN MONEY FROM THEM IN THE LAST YEAR.  THEY HAVE HAD TO PUT A PADLOCK ON THEIR BEDROOM DOOR TO KEEP HER OUT OF THEIR RX MEDS.  HE STATES THE "DISASTER" SHE DESCRIBED WITH HOME CARE WORKER WAS A PCS WORKER WHO WAS COMING TO THE HOME AND HAVING SEX WITH HER, AND SHE WAS SIGNING OFF THAT HE WAS DOING NURSE AIDE DUTIES. STEPFATHER IS VERY CONCERNED, FEELS SHE IS GREATLY OVERMEDICATED, AND IS AFRAID THAT SHE WILL HURT HERSELF AT HOME ALONE IN HER CURRENT STATE.  09/03/13 Petronella Shuford  Merna Baldi,RN,BSN [phone] PER CSW, PT REFUSING SNF PLACEMENT AT THIS TIME.  MAY NEED HH FOLLOW UP, IF NOT AGREEABLE TO SNF.  WILL FOLLOW PROGRESS.

## 2013-09-02 NOTE — Progress Notes (Signed)
Subjective: [name] is a 42 y.o. female w/ PMHx of Bipolar Disorder, Hypothyroidism, HLD, GERD, presents to the ED w/ complaints of recurrent falls over the past 1-2 months.  Patient seen at bedside this AM. Says she is tired and hungry this morning, and feels very unstable on her feet. Denies any SOB, chest pain, nausea, vomiting, fever, chills. The patient does continue to fall asleep while interviewing her and has slurring of speech on exam.   Objective: Vital signs in last 24 hours: Filed Vitals:   09/02/13 1026 09/02/13 1032 09/02/13 1036 09/02/13 1300  BP: 95/62 98/65 97/77  110/66  Pulse:    60  Temp:    97.5 F (36.4 C)  TempSrc:    Oral  Resp:    16  Height:      Weight:      SpO2:    97%   Weight change:   Intake/Output Summary (Last 24 hours) at 09/02/13 1617 Last data filed at 09/02/13 1200  Gross per 24 hour  Intake 536.25 ml  Output    430 ml  Net 106.25 ml   Physical Exam: General: Alert, cooperative, and in no apparent distress, lethargic, falling asleep during interview. Visible UE tremor. HEENT: Vision grossly intact, oropharynx clear and non-erythematous  Neck: Full range of motion without pain, supple, no lymphadenopathy or carotid bruits Lungs: Clear to ascultation bilaterally, normal work of respiration, no wheezes, rales, ronchi Heart: Regular rate and rhythm, no murmurs, gallops, or rubs Abdomen: Soft, non-tender, non-distended, normal bowel sounds Extremities: No cyanosis, clubbing, or edema Neurologic: Alert & oriented X3, cranial nerves II-XII intact, strength grossly intact, sensation intact to light touch  Lab Results: Basic Metabolic Panel:  Recent Labs Lab 09/01/13 1910 09/02/13 0445  NA 144 142  K 3.1* 4.1  CL 102 110  CO2  --  25  GLUCOSE 72 113*  BUN 14 14  CREATININE 1.10 0.81  CALCIUM  --  8.2*   Liver Function Tests:  Recent Labs Lab 09/02/13 0445  AST 9  ALT 5  ALKPHOS 34*  BILITOT 0.2*  PROT 4.8*    ALBUMIN 2.3*   CBC:  Recent Labs Lab 09/01/13 1850 09/01/13 1910 09/02/13 0445  WBC 5.8  --  3.7*  NEUTROABS 3.4  --   --   HGB 13.0 12.6 12.0  HCT 36.9 37.0 35.0*  MCV 93.7  --  95.1  PLT 97*  --  88*   Cardiac Enzymes:  Recent Labs Lab 09/02/13 0054  TROPONINI <0.30   CBG:  Recent Labs Lab 09/02/13 0258  GLUCAP 103*   Anemia Panel:  Recent Labs Lab 09/02/13 0445  VITAMINB12 653   Urine Drug Screen: Drugs of Abuse     Component Value Date/Time   LABOPIA NONE DETECTED 03/17/2013 2051   COCAINSCRNUR NONE DETECTED 03/17/2013 2051   LABBENZ NONE DETECTED 03/17/2013 2051   AMPHETMU POSITIVE* 03/17/2013 2051   THCU NONE DETECTED 03/17/2013 2051   LABBARB NONE DETECTED 03/17/2013 2051    Urinalysis:  Recent Labs Lab 09/01/13 2216  COLORURINE ORANGE*  LABSPEC 1.039*  PHURINE 6.0  GLUCOSEU NEGATIVE  HGBUR NEGATIVE  BILIRUBINUR MODERATE*  KETONESUR 15*  PROTEINUR 30*  UROBILINOGEN 1.0  NITRITE POSITIVE*  LEUKOCYTESUR MODERATE*   Studies/Results: Dg Chest 2 View  09/01/2013   CLINICAL DATA:  Back pain with shortness of breath and weakness after falling.  EXAM: CHEST  2 VIEW  COMPARISON:  Chest radiographs and CT 04/05/2011.  FINDINGS: The  heart size and mediastinal contours are normal. The lungs are clear. There is no pleural effusion or pneumothorax. No acute osseous findings are identified. An apparent EKG snap overlies the anterior aspect of the right 3rd rib.  IMPRESSION: No active cardiopulmonary disease.   Electronically Signed   By: Roxy Horseman M.D.   On: 09/01/2013 19:32   Medications: I have reviewed the patient's current medications. Scheduled Meds: . acetaminophen  1,000 mg Oral TID   Or  . acetaminophen  975 mg Rectal TID  . ARIPiprazole  15 mg Oral Daily  . buPROPion  150 mg Oral Daily  . feeding supplement (ENSURE COMPLETE)  237 mL Oral BID BM  . fludrocortisone  0.1 mg Oral Daily  . gabapentin  300 mg Oral TID  . levothyroxine  75 mcg  Oral QAC breakfast  . liothyronine  5 mcg Oral Daily  . nitrofurantoin (macrocrystal-monohydrate)  100 mg Oral Q12H  . pantoprazole  40 mg Oral Daily  . simvastatin  40 mg Oral QPC supper  . sodium chloride  3 mL Intravenous Q12H  . traZODone  200 mg Oral BID   Continuous Infusions: . dextrose 5 % and 0.9% NaCl 75 mL/hr at 09/02/13 1540   PRN Meds:.ondansetron (ZOFRAN) IV  Assessment/Plan: [name] is a 42 y.o. female w/ PMHx of Bipolar Disorder, Hypothyroidism, HLD, GERD, presents to the ED w/ complaints of recurrent falls over the past 1-2 months.  Frequent falls- Patient has a history of frequent falls, she was not orthostatic by vital signs but subjectively she is orthostatic at home. She has been prescribed florinef by her PCP due to suspicion medication induced orthostasis but has not been taking this medication. She does not report LOC or trauma to her head during her fall, she does have some low back pain but no spinous process tenderness- will hold off any imaging of her lumbar spine at this time. Her dizziness is less likely vertigo given her description of her dizziness. Her UTI could be a pontential cause however this is unlikely to have been ongoing for 1.5 months. Hypoglycemia is a concern given her low normal CBGs recently and her history of decreased PO intake. Her psychiatric medications could also be sedating and causing her these frequent falls however she reports no recent changes to her psychiatric meds. Her head CT in 04/2013 did note cerebellar volume loss, this could be a contributing factor, she denies any ETOH use. Also taking Depakote. - Started Florinef 0.1 mg daily  - IVF D5NS at 75cc, will monitor CBGs  - B12, Thiamine, Copper, Vitamin E and Vitamin D pending - Neurology consulted, feel that Depakote may be at toxic levels as it is high normal. Given the low albumin of 2.3, could be at a toxic level. Will hold for now. Also agree with MRI, will perform in  AM. -PT saw patient today, recommended SNF placement given her instability.   Hypothyroidism- Restarted home medications. - Synthroid daily  - Liothronine daily   HYPERCHOLESTEROLEMIA  -Continue simvastatin   DISORDER, BIPOLAR NOS  -Continue home medications of Abilify, Wellbutrin, Depakote, Trazodone   GERD  -Protonix 40mg    UTI (lower urinary tract infection)  -Patient reports completing Keflex which recent culture showed sensitivity of E. Coli and Klebsiella Pneumo were sensitive to. However patient continues to report dysuria and U/A is consistent with a UTI.  - Started Macrobid po 5 days   Dispo: Disposition is deferred at this time, awaiting improvement  of current medical problems.  Anticipated discharge in approximately 1-2 day(s).   The patient does have a current PCP Linward Headland, MD) and does need an [facility] hospital follow-up appointment after discharge.  The patient does not have transportation limitations that hinder transportation to clinic appointments.  .Services Needed at time of discharge: Y = Yes, Blank = No PT: SNF  OT:   RN:   Equipment:   Other:     LOS: 1 day   Courtney Paris, MD 09/02/2013, 4:17 PM Pager: [phone]

## 2013-09-02 NOTE — Evaluation (Signed)
Physical Therapy Evaluation Patient Details Name: [name] MRN: [id number] DOB: 28-Nov-1970 Today's Date: 09/02/2013 Time: 1478-2956 PT Time Calculation (min): 34 min  PT Assessment / Plan / Recommendation History of Present Illness  42 y.o. female admitted to [facility] on 09/01/13 with fall in the bathroom at her home.  Pt with hx bipolar disorder, hypothyroidism HL.  She was recently on antibiotics for UTI.    Clinical Impression  Pt is very lethargic, falling asleep during my history questions.  Ortho statics re-taken and are (-), and BP is not really that high.  Pt was unable to walk on evaluation due to back pain in standing and significant amount of posterior lean.  I will need to have a second person to assist with gait and will need to find a RW (I will try to re-attempt this PM once the ativan has worn off and the pt is more alert).  As of this assessment, the pt is not safe to return home, where she has no one to assist her at discharge and at this point is unable to walk without assistance.     PT Assessment  Patient needs continued PT services    Follow Up Recommendations  SNF;Other (comment) (she is not safe to go home alone and needs 24/7 care)    Does the patient have the potential to tolerate intense rehabilitation     NA  Barriers to Discharge Decreased caregiver support pt reports her mom is very sick and could not provide care for her.  She has no one else who could provide assist at this time.      Equipment Recommendations  None recommended by PT    Recommendations for Other Services   None  Frequency Min 3X/week    Precautions / Restrictions Precautions Precautions: Fall Precaution Comments: posterior lean in sitting and in standing.    Pertinent Vitals/Pain See vitals flow sheet. Orthostatic BP: Supine 95/62  Sitting 98/65  Standing 97/77         Mobility  Bed Mobility Bed Mobility: Supine to Sit;Sitting - Scoot to Edge of Bed;Sit to Supine Supine  to Sit: 6: Modified independent (Device/Increase time);With rails;HOB flat Sitting - Scoot to Edge of Bed: 6: Modified independent (Device/Increase time);With rail Sit to Supine: 6: Modified independent (Device/Increase time);With rail;HOB flat Details for Bed Mobility Assistance: pt able to get to EOB with the use of the railing and without external assist.   Transfers Transfers: Sit to Stand;Stand to Sit Sit to Stand: 4: Min assist;With upper extremity assist;With armrests;From bed Stand to Sit: 4: Min assist;With upper extremity assist;With armrests;To bed Details for Transfer Assistance: min assist to support trunk for balance as pt is leaning hard posteriorly.  When asked where she was leaning she reported "backwards" and was able to correct breifly before returning to posterior lean.   Ambulation/Gait Ambulation/Gait Assistance: Not tested (comment) (not safe in current level of lethargy and posterior lean.  )        PT Diagnosis: Difficulty walking;Abnormality of gait;Generalized weakness;Acute pain  PT Problem List: Decreased strength;Decreased activity tolerance;Decreased balance;Decreased mobility;Decreased knowledge of use of DME;Pain PT Treatment Interventions: DME instruction;Gait training;Stair training;Functional mobility training;Therapeutic activities;Therapeutic exercise;Balance training;Neuromuscular re-education;Patient/family education;Modalities     PT Goals(Current goals can be found in the care plan section) Acute Rehab PT Goals Patient Stated Goal: to be able to care for herself PT Goal Formulation: With patient Time For Goal Achievement: 09/16/13 Potential to Achieve Goals: Good  Visit Information  Last  PT Received On: 09/02/13 Assistance Needed: +1 (possible need for +2 for gait) History of Present Illness: 42 y.o. female admitted to [facility] on 09/01/13 with fall in the bathroom at her home.  Pt with hx bipolar disorder, hypothyroidism HL.  She was recently on  antibiotics for UTI.         Prior Functioning  Home Living Family/patient expects to be discharged to:: Private residence Living Arrangements: Alone (with dog- mom is currently taking care of the dog) Available Help at Discharge: Other (Comment) (none per pt report) Type of Home: House Home Access: Stairs to enter [employer] of Steps: 1 Entrance Stairs-Rails: None Home Layout: One level Home Equipment: Walker - 2 wheels;Bedside commode Prior Function Level of Independence: Independent with assistive device(s) Comments: sometimes uses the walker Communication Communication: Other (comment) (pt is falling asleep during our conversation) Dominant Hand: Right    Cognition  Cognition Arousal/Alertness: Lethargic Behavior During Therapy: Flat affect    Extremity/Trunk Assessment Upper Extremity Assessment Upper Extremity Assessment: Defer to OT evaluation Lower Extremity Assessment Lower Extremity Assessment: RLE deficits/detail;LLE deficits/detail RLE Deficits / Details: 4/5 per MMT EOB bil, no significant differences left vs right, no reports of numbness or tingling in extremities LLE Deficits / Details: 4/5 per MMT EOB bil, no significant differences left vs right, no reports of numbness or tingling in extremities Cervical / Trunk Assessment Cervical / Trunk Assessment: Normal;Other exceptions Cervical / Trunk Exceptions: normal posture; however reports pain in low back and buttocks because this is where she hit the ground when she fell   Balance Balance Balance Assessed: Yes Static Sitting Balance Static Sitting - Balance Support: Bilateral upper extremity supported;Feet supported Static Sitting - Level of Assistance: 4: Min assist;3: Mod assist Static Sitting - Comment/# of Minutes: min to mod assist to maintain balance in midline especially while pt was moving her feet for MMT.  LOB posteriorly in sitting.   Static Standing Balance Static Standing - Balance  Support: Right upper extremity supported Static Standing - Level of Assistance: 4: Min assist;3: Mod assist Static Standing - Comment/# of Minutes: up to mod assist the longer we stood.  Pt with hard posterior lean in standing.    End of Session PT - End of Session Activity Tolerance: Patient limited by lethargy;Patient limited by pain Patient left: in bed;with call bell/phone within reach;with bed alarm set Nurse Communication: Mobility status  GP Functional Assessment Tool Used: assist levels Functional Limitation: Mobility: Walking and moving around Mobility: Walking and Moving Around Current Status ([id number]): At least 40 percent but less than 60 percent impaired, limited or restricted Mobility: Walking and Moving Around Goal Status [phone]): At least 1 percent but less than 20 percent impaired, limited or restricted   Salma Walrond B. Lachae Hohler, PT, DPT [phone]   09/02/2013, 11:03 AM

## 2013-09-02 NOTE — Progress Notes (Signed)
I had a chance to speak privately with patients mom and step-dad while they were visiting the patient. The mother reports that the patient has been having these falls and mood-swings for about 4 months now since she stopped taking her Klonopin. She also reports that the patient has had increased tremors since she stopped taking her Klonopin. Patients said that she told her doctor that she wanted to try being off the Klonopin. She has been on it for 20 years. The patient's mother strongly believes that what's going on with the patient is related to her being off of that medication. The patient's mother and step-dad say that they stress a lot worrying about her and that she is very "sick" and they are scared that she is going to die if something isn't done. The mother says that the patient's psychiatric problems started after she had her first son, who is now 36 years old. 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 The ex-husband has full custody of their son, and the son doesn't have any interest of his mother being in his life. The mother states that this makes the patient extremely sad and also contributes to her psych problems.The patient was going to therapy, but the mother hasn't been able to take her lately. The patient does not drive, and her mother is her only source of transportation. The mother stated that the patient has also been to behavioral health in the past but refuses to go back there because she states. "she is afraid of those people".  The mother is now unable to fully care for her daughter because she recently had a back surgery, and cannot walk long distances. The mother and step-father really want what is in best interest for their daughter and want to be involved in her care as much as possible. Attempted to have the same conversation with the patient but she 1) fell asleep  while I was talking to her. 2) Didn't answer my questions appropriately. 3) or said "I don't know". Rise Paganini RN

## 2013-09-02 NOTE — Consult Note (Signed)
NEURO HOSPITALIST CONSULT NOTE    Reason for Consult: Falls and cerebellar atrophy noted on CT head.  HPI:                                                                                                                                          [name] is an 42 y.o. female who was brought to hospital due to falls. Patient would not give me a coherent history and much of history was obtained from the chart.  In speaking with patient she has been falling over "the last month, and it hurts".  When asked further questions about where she was, how she felt, what symptoms was causing her falls, she would repeat the same phrase "it just hurts" and then not answer my questions.  When I would speak her name she quickly would become aroused and asked me what I would like to asked her.  This occured multiple times.  With noxious stimuli she would react appropriately and briskly to the stimuli. From the history obtained from the residents: "bipolar disorder, hypothyroidism HL, presented with fall. The patient denied LOC, but her history is not clear. She admits to feeling dizzy when standing. Admits to recent diarrhea. She has recently finished a course of oral abx for UTI. There is a history of concern for medication induced orthostasis and the patient was placed on Florinef. Per history, she has not been adherent to this medication."  Looking back into the chart, patient has had multiple complaints about not being able to eat and question of psych component was raised.   Patient does have a bruise on the bottom of her chin --which she states occurred last week.    Past Medical History  Diagnosis Date  . Deep venous thrombosis of leg 2012    completed year of coumadin   . Hx of measles   . Migraines   . Monilia infection 12/31/10  . Vaginal atrophy 03/04/11  . Dyspareunia 03/04/11  . Bipolar 1 disorder     Followed by Mental Health.  All psychiatric medications prescribed by Mental  Health.  Stable for many years.    . Hypothyroidism     Hypothyroidism since 8th grade.  Managed by Dr. Sharl Ma, Endocrinology.  On synthroid.  No prior thyroid surgery/ablation.     Past Surgical History  Procedure Laterality Date  . 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 Cesarean section  1998  . Tonsillectomy    . Wisdom tooth extraction    . Cholecystectomy    . Left elbow    . Abdominal hysterectomy  2000  . Appendectomy      Family History  Problem Relation Age of Onset  . Cancer Maternal Aunt  Social History:  reports that she quit smoking about 10 months ago. Her smoking use included Cigarettes. She has a 12.5 pack-year smoking history. She has never used smokeless tobacco. She reports that she does not drink alcohol or use illicit drugs.  Allergies  Allergen Reactions  . Codeine     Nausea and vomiting  . Meloxicam Nausea Only  . Morphine And Related     Makes me loopy and hallucinates  . Naproxen     Rash and nausea  . Sulfonamide Derivatives     Nausea and rash    MEDICATIONS:                                                                                                                     Prior to Admission:  Prescriptions prior to admission  Medication Sig Dispense Refill  . ARIPiprazole (ABILIFY) 15 MG tablet Take 15 mg by mouth daily.      Marland Kitchen buPROPion (WELLBUTRIN XL) 150 MG 24 hr tablet Take 150 mg by mouth daily.      . divalproex (DEPAKOTE ER) 500 MG 24 hr tablet Take 1,000 mg by mouth at bedtime.      . fludrocortisone (FLORINEF) 0.1 MG tablet Take 1 tablet (0.1 mg total) by mouth daily.  30 tablet  2  . gabapentin (NEURONTIN) 300 MG capsule Take 300 mg by mouth 3 (three) times daily.      Marland Kitchen levothyroxine (SYNTHROID, LEVOTHROID) 88 MCG tablet Take 88 mcg by mouth daily before breakfast.      . liothyronine (CYTOMEL) 5 MCG tablet Take 5 mcg by mouth daily.      . ondansetron (ZOFRAN ODT) 4 MG disintegrating tablet Take 1 tablet (4 mg total)  by mouth every 8 (eight) hours as needed for nausea.  60 tablet  2  . pantoprazole (PROTONIX) 40 MG tablet Take 1 tablet (40 mg total) by mouth daily.  30 tablet  11  . simvastatin (ZOCOR) 40 MG tablet Take 1 tablet (40 mg total) by mouth every morning.  30 tablet  11  . traMADol (ULTRAM) 50 MG tablet Take 50 mg by mouth every 6 (six) hours as needed for pain.      . traZODone (DESYREL) 100 MG tablet Take 200 mg by mouth 2 (two) times daily.       Marland Kitchen triamcinolone cream (KENALOG) 0.1 % Apply topically 2 (two) times daily. To red, itchy areas on neck and arms.  Do not use for more than 2 weeks  45 g  0   Scheduled: . acetaminophen  1,000 mg Oral TID   Or  . acetaminophen  975 mg Rectal TID  . ARIPiprazole  15 mg Oral Daily  . buPROPion  150 mg Oral Daily  . divalproex  1,000 mg Oral QHS  . fludrocortisone  0.1 mg Oral Daily  . gabapentin  300 mg Oral TID  . levothyroxine  75 mcg Oral QAC breakfast  . liothyronine  5 mcg Oral Daily  .  nitrofurantoin (macrocrystal-monohydrate)  100 mg Oral Q12H  . pantoprazole  40 mg Oral Daily  . simvastatin  40 mg Oral QPC supper  . sodium chloride  3 mL Intravenous Q12H  . traZODone  200 mg Oral BID   Continuous: . dextrose 5 % and 0.9% NaCl 75 mL/hr at 09/02/13 0203   ONG:[id number] (ZOFRAN) IV   ROS:                                                                                                                                       History obtained from the patient  General ROS: negative for - chills, fatigue, fever, night sweats, weight gain or weight loss Psychological ROS: negative for - behavioral disorder, hallucinations, memory difficulties, mood swings or suicidal ideation Ophthalmic ROS: negative for - blurry vision, double vision, eye pain or loss of vision ENT ROS: negative for - epistaxis, nasal discharge, oral lesions, sore throat, tinnitus or vertigo Allergy and Immunology ROS: negative for - hives or itchy/watery  eyes Hematological and Lymphatic ROS: negative for - bleeding problems, bruising or swollen lymph nodes Endocrine ROS: negative for - galactorrhea, hair pattern changes, polydipsia/polyuria or temperature intolerance Respiratory ROS: negative for - cough, hemoptysis, shortness of breath or wheezing Cardiovascular ROS: negative for - chest pain, dyspnea on exertion, edema or irregular heartbeat Gastrointestinal ROS: negative for - abdominal pain, diarrhea, hematemesis, nausea/vomiting or stool incontinence Genito-Urinary ROS: negative for - dysuria, hematuria, incontinence or urinary frequency/urgency Musculoskeletal ROS: negative for - joint swelling or muscular weakness Neurological ROS: as noted in HPI Dermatological ROS: negative for rash and skin lesion changes   Blood pressure 97/77, pulse 68, temperature 97.7 F (36.5 C), temperature source Oral, resp. rate 16, height 5\' 5"  (1.651 m), weight 68.947 kg (152 lb), SpO2 89.00%.   Neurologic Examination:                                                                                                      Mental Status: Alert, oriented.  Speech fluent without evidence of aphasia.  Able to follow 3 step commands without difficulty. Cranial Nerves: II: Discs flat bilaterally; Visual fields grossly normal, pupils equal, round, reactive to light and accommodation III,IV, VI: ptosis not present, extra-ocular motions intact bilaterally, impaired smooth pursuit V,VII: smile symmetric, facial light touch sensation normal bilaterally VIII: hearing normal bilaterally IX,X: gag reflex present XI: bilateral shoulder shrug XII: midline tongue extension without atrophy or fasciculations  Motor: Right : Upper extremity  5/5    Left:     Upper extremity   5/5  Lower extremity   5/5     Lower extremity   5/5 --While sitting at edge of the bed patient would lean backward and would not adjust her position to lean forward even after advised by  practitioner to shift her weight forward.  Tone and bulk:normal tone throughout; no atrophy noted Sensory: Pinprick and light touch intact throughout, bilaterally but decreased to temperature Deep Tendon Reflexes:  Right: Upper Extremity   Left: Upper extremity   biceps (C-5 to C-6) 2/4   biceps (C-5 to C-6) 2/4 tricep (C7) 2/4    triceps (C7) 2/4 Brachioradialis (C6) 2/4  Brachioradialis (C6) 2/4  Lower Extremity Lower Extremity  quadriceps (L-2 to L-4) 2/4   quadriceps (L-2 to L-4) 2/4 Achilles (S1) 2/4   Achilles (S1) 2/4  Plantars: Right: downgoing   Left: downgoing Cerebellar: Slight ataxia finger-to-nose bilaterally but was unable to distract to evaluate validity of this ataxia.  Gait: Patient would lean backward at hips but would not fall, when eyes closed and feet held together leaned to the right, legs would intermittently buckle but never gave way.  CV: pulses palpable throughout    Lab Results  Component Value Date/Time   CHOL 169 08/14/2012  3:53 PM    Results for orders placed during the hospital encounter of 09/01/13 (from the past 48 hour(s))  CBC WITH DIFFERENTIAL     Status: Abnormal   Collection Time    09/01/13  6:50 PM      Result Value Range   WBC 5.8  4.0 - 10.5 K/uL   RBC 3.94  3.87 - 5.11 MIL/uL   Hemoglobin 13.0  12.0 - 15.0 g/dL   HCT 16.1  09.6 - 04.5 %   MCV 93.7  78.0 - 100.0 fL   MCH 33.0  26.0 - 34.0 pg   MCHC 35.2  30.0 - 36.0 g/dL   RDW 40.9  81.1 - 91.4 %   Platelets 97 (*) 150 - 400 K/uL   Comment: CONSISTENT WITH PREVIOUS RESULT   Neutrophils Relative % 58  43 - 77 %   Neutro Abs 3.4  1.7 - 7.7 K/uL   Lymphocytes Relative 30  12 - 46 %   Lymphs Abs 1.7  0.7 - 4.0 K/uL   Monocytes Relative 12  3 - 12 %   Monocytes Absolute 0.7  0.1 - 1.0 K/uL   Eosinophils Relative 0  0 - 5 %   Eosinophils Absolute 0.0  0.0 - 0.7 K/uL   Basophils Relative 0  0 - 1 %   Basophils Absolute 0.0  0.0 - 0.1 K/uL  POCT I-STAT TROPONIN I     Status: None    Collection Time    09/01/13  7:08 PM      Result Value Range   Troponin i, poc 0.00  0.00 - 0.08 ng/mL   Comment 3            Comment: Due to the release kinetics of cTnI,     a negative result within the first hours     of the onset of symptoms does not rule out     myocardial infarction with certainty.     If myocardial infarction is still suspected,     repeat the test at appropriate intervals.  POCT I-STAT, CHEM 8     Status: Abnormal   Collection Time    09/01/13  7:10  PM      Result Value Range   Sodium 144  135 - 145 mEq/L   Potassium 3.1 (*) 3.5 - 5.1 mEq/L   Chloride 102  96 - 112 mEq/L   BUN 14  6 - 23 mg/dL   Creatinine, Ser 2.13  0.50 - 1.10 mg/dL   Glucose, Bld 72  70 - 99 mg/dL   Calcium, Ion 0.86 (*) 1.12 - 1.23 mmol/L   TCO2 26  0 - 100 mmol/L   Hemoglobin 12.6  12.0 - 15.0 g/dL   HCT 57.8  46.9 - 62.9 %  URINALYSIS, ROUTINE W REFLEX MICROSCOPIC     Status: Abnormal   Collection Time    09/01/13 10:16 PM      Result Value Range   Color, Urine ORANGE (*) YELLOW   Comment: BIOCHEMICALS MAY BE AFFECTED BY COLOR   APPearance CLOUDY (*) CLEAR   Specific Gravity, Urine 1.039 (*) 1.005 - 1.030   pH 6.0  5.0 - 8.0   Glucose, UA NEGATIVE  NEGATIVE mg/dL   Hgb urine dipstick NEGATIVE  NEGATIVE   Bilirubin Urine MODERATE (*) NEGATIVE   Ketones, ur 15 (*) NEGATIVE mg/dL   Protein, ur 30 (*) NEGATIVE mg/dL   Urobilinogen, UA 1.0  0.0 - 1.0 mg/dL   Nitrite POSITIVE (*) NEGATIVE   Leukocytes, UA MODERATE (*) NEGATIVE  URINE MICROSCOPIC-ADD ON     Status: Abnormal   Collection Time    09/01/13 10:16 PM      Result Value Range   Squamous Epithelial / LPF MANY (*) RARE   WBC, UA 7-10  <3 WBC/hpf   Bacteria, UA MANY (*) RARE   Crystals CA OXALATE CRYSTALS (*) NEGATIVE  POCT PREGNANCY, URINE     Status: None   Collection Time    09/01/13 10:17 PM      Result Value Range   Preg Test, Ur NEGATIVE  NEGATIVE   Comment:            THE SENSITIVITY OF THIS      METHODOLOGY IS >24 mIU/mL  TROPONIN I     Status: None   Collection Time    09/02/13 12:54 AM      Result Value Range   Troponin I <0.30  <0.30 ng/mL   Comment:            Due to the release kinetics of cTnI,     a negative result within the first hours     of the onset of symptoms does not rule out     myocardial infarction with certainty.     If myocardial infarction is still suspected,     repeat the test at appropriate intervals.  GLUCOSE, CAPILLARY     Status: Abnormal   Collection Time    09/02/13  2:58 AM      Result Value Range   Glucose-Capillary 103 (*) 70 - 99 mg/dL  COMPREHENSIVE METABOLIC PANEL     Status: Abnormal   Collection Time    09/02/13  4:45 AM      Result Value Range   Sodium 142  135 - 145 mEq/L   Potassium 4.1  3.5 - 5.1 mEq/L   Comment: DELTA CHECK NOTED   Chloride 110  96 - 112 mEq/L   CO2 25  19 - 32 mEq/L   Glucose, Bld 113 (*) 70 - 99 mg/dL   BUN 14  6 - 23 mg/dL   Creatinine, Ser 5.28  0.50 -  1.10 mg/dL   Calcium 8.2 (*) 8.4 - 10.5 mg/dL   Total Protein 4.8 (*) 6.0 - 8.3 g/dL   Albumin 2.3 (*) 3.5 - 5.2 g/dL   AST 9  0 - 37 U/L   ALT 5  0 - 35 U/L   Alkaline Phosphatase 34 (*) 39 - 117 U/L   Total Bilirubin 0.2 (*) 0.3 - 1.2 mg/dL   GFR calc non Af Amer 87 (*) >90 mL/min   GFR calc Af Amer >90  >90 mL/min   Comment: (NOTE)     The eGFR has been calculated using the CKD EPI equation.     This calculation has not been validated in all clinical situations.     eGFR's persistently <90 mL/min signify possible Chronic Kidney     Disease.  CBC     Status: Abnormal   Collection Time    09/02/13  4:45 AM      Result Value Range   WBC 3.7 (*) 4.0 - 10.5 K/uL   RBC 3.68 (*) 3.87 - 5.11 MIL/uL   Hemoglobin 12.0  12.0 - 15.0 g/dL   HCT 16.1 (*) 09.6 - 04.5 %   MCV 95.1  78.0 - 100.0 fL   MCH 32.6  26.0 - 34.0 pg   MCHC 34.3  30.0 - 36.0 g/dL   RDW 40.9  81.1 - 91.4 %   Platelets 88 (*) 150 - 400 K/uL   Comment: CONSISTENT WITH PREVIOUS RESULT   VALPROIC ACID LEVEL     Status: None   Collection Time    09/02/13  9:00 AM      Result Value Range   Valproic Acid Lvl 84.1  50.0 - 100.0 ug/mL    Dg Chest 2 View  09/01/2013   CLINICAL DATA:  Back pain with shortness of breath and weakness after falling.  EXAM: CHEST  2 VIEW  COMPARISON:  Chest radiographs and CT 04/05/2011.  FINDINGS: The heart size and mediastinal contours are normal. The lungs are clear. There is no pleural effusion or pneumothorax. No acute osseous findings are identified. An apparent EKG snap overlies the anterior aspect of the right 3rd rib.  IMPRESSION: No active cardiopulmonary disease.   Electronically Signed   By: Roxy Horseman M.D.   On: 09/01/2013 19:32   CT HEAD WITHOUT CONTRAST 04-30-12 Technique: Contiguous axial images were obtained from the base of  the skull through the vertex without contrast.  Comparison: 04/17/2012  Findings: Cerebellar volume loss. There is no evidence for acute  hemorrhage, hydrocephalus, mass lesion, or abnormal extra-axial  fluid collection. No definite CT evidence for acute infarction.  The visualized paranasal sinuses and mastoid air cells are  predominately clear. No displaced calvarial fracture.  IMPRESSION:  Cerebellar volume loss. No definite acute intracranial  abnormality.  --B12, Vit E, Thiamine, Copper PENDING  Felicie Morn PA-C [facility] [phone]  09/02/2013, 11:53 AM   I have seen and evaluated the patient. I have reviewed the above note and made appropriate changes.   Assessment/Plan:  42 yo M with sleepiness, difficulty walking, tremor. Though her Depakote level is within the high normal range, I am concerned that with an albumin of 2.3, that the symptoms that she is describing are due to Depakote toxicity as it is a highly protein-bound medication.  She has very mild possible decrease in the volume of her cerebellum from normal, but this appears very similar to a CT done in 2005. That being  said, with her current  symptoms it may be reasonable to do an MRI of her brain  Some of the findings on her exam are also concerning for possible embellishment, but by physical exam, this was not definite.   1) free Depakote level, will take several days come back 2) MRI brain 3) hold Depakote   Ritta Slot, MD [facility] [phone]  If 7pm- 7am, please page neurology on call at [phone].

## 2013-09-03 ENCOUNTER — Observation Stay (HOSPITAL_COMMUNITY): Payer: Medicaid Other

## 2013-09-03 DIAGNOSIS — Z9181 History of falling: Secondary | ICD-10-CM

## 2013-09-03 DIAGNOSIS — E039 Hypothyroidism, unspecified: Secondary | ICD-10-CM

## 2013-09-03 DIAGNOSIS — F319 Bipolar disorder, unspecified: Secondary | ICD-10-CM

## 2013-09-03 DIAGNOSIS — R300 Dysuria: Secondary | ICD-10-CM

## 2013-09-03 DIAGNOSIS — N390 Urinary tract infection, site not specified: Secondary | ICD-10-CM

## 2013-09-03 DIAGNOSIS — R892 Abnormal level of other drugs, medicaments and biological substances in specimens from other organs, systems and tissues: Secondary | ICD-10-CM | POA: Diagnosis present

## 2013-09-03 LAB — COPPER, SERUM: Copper: 55 ug/dL — ABNORMAL LOW (ref 70–175)

## 2013-09-03 LAB — GLUCOSE, CAPILLARY
Glucose-Capillary: 116 mg/dL — ABNORMAL HIGH (ref 70–99)
Glucose-Capillary: 90 mg/dL (ref 70–99)
Glucose-Capillary: 80 mg/dL (ref 70–99)
Glucose-Capillary: 68 mg/dL — ABNORMAL LOW (ref 70–99)
Glucose-Capillary: 70 mg/dL (ref 70–99)
Glucose-Capillary: 96 mg/dL (ref 70–99)
Glucose-Capillary: 85 mg/dL (ref 70–99)

## 2013-09-03 LAB — CBC
HCT: 37.3 % (ref 36.0–46.0)
Hemoglobin: 12.9 g/dL (ref 12.0–15.0)
MCHC: 34.6 g/dL (ref 30.0–36.0)
Platelets: 91 10*3/uL — ABNORMAL LOW (ref 150–400)
RBC: 3.88 MIL/uL (ref 3.87–5.11)
RDW: 13.7 % (ref 11.5–15.5)
WBC: 7.3 10*3/uL (ref 4.0–10.5)

## 2013-09-03 LAB — BASIC METABOLIC PANEL
Chloride: 112 mEq/L (ref 96–112)
GFR calc Af Amer: 82 mL/min — ABNORMAL LOW (ref >90)
GFR calc non Af Amer: 71 mL/min — ABNORMAL LOW (ref >90)
Potassium: 3.7 mEq/L (ref 3.5–5.1)
Sodium: 143 mEq/L (ref 135–145)

## 2013-09-03 LAB — VITAMIN D 25 HYDROXY (VIT D DEFICIENCY, FRACTURES): Vit D, 25-Hydroxy: 44 ng/mL (ref 30–89)

## 2013-09-03 LAB — URINE CULTURE

## 2013-09-03 MED ORDER — BOOST / RESOURCE BREEZE PO LIQD
1.0000 | Freq: Three times a day (TID) | ORAL | Status: DC
  Administered 2013-09-03: 1 via ORAL

## 2013-09-03 MED ORDER — LORAZEPAM 2 MG/ML IJ SOLN
0.5000 mg | Freq: Once | INTRAMUSCULAR | Status: AC
  Administered 2013-09-03: 0.5 mg via INTRAVENOUS

## 2013-09-03 MED ORDER — LORAZEPAM 2 MG/ML IJ SOLN
INTRAMUSCULAR | Status: AC
  Filled 2013-09-03: qty 1

## 2013-09-03 MED ORDER — TRAZODONE HCL 100 MG PO TABS
200.0000 mg | ORAL_TABLET | Freq: Every day | ORAL | Status: DC
  Filled 2013-09-03: qty 2

## 2013-09-03 NOTE — Progress Notes (Signed)
NEURO HOSPITALIST PROGRESS NOTE   SUBJECTIVE:                                                                                                                        Patient remains unable to keep food down, she feels her gait has improved slightly but remains unsteady of her feet.   OBJECTIVE:                                                                                                                           Vital signs in last 24 hours: Temp:  [97.5 F (36.4 C)-98.3 F (36.8 C)] 98.1 F (36.7 C) (11/04 0420) Pulse Rate:  [60-75] 75 (11/04 0420) Resp:  [16-18] 18 (11/04 0420) BP: (109-113)/(66-77) 109/69 mmHg (11/04 0420) SpO2:  [94 %-97 %] 97 % (11/04 0420)  Intake/Output from previous day: 11/03 0701 - 11/04 0700 In: 120 [P.O.:120] Out: 1825 [Urine:1825] Intake/Output this shift:   Nutritional status: Dysphagia  Past Medical History  Diagnosis Date  . Deep venous thrombosis of leg 2012    completed year of coumadin   . Hx of measles   . Migraines   . Monilia infection 12/31/10  . Vaginal atrophy 03/04/11  . Dyspareunia 03/04/11  . Bipolar 1 disorder     Followed by Mental Health.  All psychiatric medications prescribed by Mental Health.  Stable for many years.    . Hypothyroidism     Hypothyroidism since 8th grade.  Managed by Dr. Sharl Ma, Endocrinology.  On synthroid.  No prior thyroid surgery/ablation.       Neurologic Exam:  Mental Status: Alert, oriented, thought content appropriate.  Speech fluent without evidence of aphasia.  Able to follow 3 step commands without difficulty. Cranial Nerves: II: Visual fields grossly normal, pupils equal, round, reactive to light and accommodation III,IV, VI: ptosis not present, extra-ocular motions intact bilaterally V,VII: smile symmetric, facial light touch sensation normal bilaterally VIII: hearing normal bilaterally IX,X: gag reflex present XI: bilateral shoulder shrug XII: midline  tongue extension without atrophy or fasciculations  Motor: Right : Upper extremity   5/5    Left:     Upper extremity   5/5  Lower extremity   5/5     Lower  extremity   5/5 Tone and bulk:normal tone throughout; no atrophy noted Sensory: Pinprick and light touch intact throughout, bilaterally Deep Tendon Reflexes:  Right: Upper Extremity   Left: Upper extremity   biceps (C-5 to C-6) 2/4   biceps (C-5 to C-6) 2/4 tricep (C7) 2/4    triceps (C7) 2/4 Brachioradialis (C6) 2/4  Brachioradialis (C6) 2/4  Lower Extremity Lower Extremity  quadriceps (L-2 to L-4) 2/4   quadriceps (L-2 to L-4) 2/4 Achilles (S1) 2/4   Achilles (S1) 2/4  Plantars: Right: downgoing   Left: downgoing Cerebellar: Tremor with finger-to-nose which is not distract able, normal heel-to-shin test CV: pulses palpable throughout    Lab Results: Lab Results  Component Value Date/Time   CHOL 169 08/14/2012  3:53 PM   Lipid Panel No results found for this basename: CHOL, TRIG, HDL, CHOLHDL, VLDL, LDLCALC,  in the last 72 hours  Studies/Results: Dg Chest 2 View  09/01/2013   CLINICAL DATA:  Back pain with shortness of breath and weakness after falling.  EXAM: CHEST  2 VIEW  COMPARISON:  Chest radiographs and CT 04/05/2011.  FINDINGS: The heart size and mediastinal contours are normal. The lungs are clear. There is no pleural effusion or pneumothorax. No acute osseous findings are identified. An apparent EKG snap overlies the anterior aspect of the right 3rd rib.  IMPRESSION: No active cardiopulmonary disease.   Electronically Signed   By: Roxy Horseman M.D.   On: 09/01/2013 19:32   Mr Brain Wo Contrast  09/03/2013   CLINICAL DATA:  Fall. Dizziness  EXAM: MRI HEAD WITHOUT CONTRAST  TECHNIQUE: Multiplanar, multisequence MR imaging was performed. No intravenous contrast was administered.  COMPARISON:  CT head 04/30/2012  FINDINGS: Incomplete exam. The study was limited to axial T2, FLAIR, and diffusion.  Generalized atrophy.  Negative for acute infarct. No significant chronic ischemia. Negative for mass or fluid collection.  IMPRESSION: Generalized atrophy. No acute abnormality.   Electronically Signed   By: Marlan Palau M.D.   On: 09/03/2013 10:04    MEDICATIONS                                                                                                                        Scheduled: . acetaminophen  1,000 mg Oral TID   Or  . acetaminophen  975 mg Rectal TID  . ARIPiprazole  15 mg Oral Daily  . buPROPion  150 mg Oral Daily  . feeding supplement (ENSURE COMPLETE)  237 mL Oral BID BM  . fludrocortisone  0.1 mg Oral Daily  . gabapentin  300 mg Oral TID  . levothyroxine  75 mcg Oral QAC breakfast  . liothyronine  5 mcg Oral Daily  . LORazepam      . LORazepam      . nitrofurantoin (macrocrystal-monohydrate)  100 mg Oral Q12H  . pantoprazole  40 mg Oral Daily  . simvastatin  40 mg Oral QPC supper  . sodium chloride  3 mL Intravenous Q12H  .  traZODone  200 mg Oral BID    ASSESSMENT/PLAN:                                                                                                           Gait instability:  MRI brain showed no acute changes or increased atrophy compared to previous CT head. Fee Depakote level is still pending, however given patients symptoms of Nausea, gait instability, decreased PLTs, somnolence and tremor--likely cause is Depakote toxicity.  At present time her Depakote has been held and patient has shown improvement with mental status and some improvement in her gait. Will continue to hold Depakote.  I have spoken to teaching service and they have already contacted Psych to evaluate patient.  Would recommend patient being placed on different medication such as Lamictal for mood stabilization. IF Lamictal is started would start out slow (25 mg every other day ) due to the inhibiting effects of Depakote.  Would recommend calling pharmacy for recommendations on starting dose and frequency.  I have spoken to the resident and shared this information.    Assessment and plan discussed with with attending physician and they are in agreement.    Felicie Morn PA-C [facility] [phone]  09/03/2013, 66:27 AM   42 year old female with gait instability, drowsiness, tremor, nausea/anorexia with low albumin and high normal total Depakote level. The description of symptoms seems to fit very well with Depakote toxicity, and I suspect that her free level is considerably higher than would be wanted. This also likely explains her thrombocytopenia.  She does seem to be slightly less drowsy this morning after holding Depakote.  At this time I would favor changing from Depakote to another mood stabilizing agent. Psychiatry appears to have been consulted.  I would continue to have the patient were with physical therapy, but no other acute recommendations at this time. Neurology will sign off, please call with any further questions.   Ritta Slot, MD [facility] [phone]  If 7pm- 7am, please page neurology on call at [phone].

## 2013-09-03 NOTE — Progress Notes (Signed)
Subjective: [name] is a 42 y.o. female w/ PMHx of Bipolar Disorder, Hypothyroidism, HLD, GERD, presents to the ED w/ complaints of recurrent falls over the past 1-2 months.  Patient seen at bedside today. Still complaining of some dizziness and lightheadedness. Says her appetite is okay, but dentures are inhibiting her from eating well. Denies any other complaints.   Discussed SNF with patient and she adamantly refused, became tearful.   Objective: Vital signs in last 24 hours: Filed Vitals:   09/02/13 2009 09/03/13 0420 09/03/13 1046 09/03/13 1408  BP: 113/77 109/69 90/65 117/73  Pulse: 72 75 75 58  Temp: 98.3 F (36.8 C) 98.1 F (36.7 C) 98.3 F (36.8 C) 98.4 F (36.9 C)  TempSrc: Oral Oral Oral Oral  Resp: 18 18 22 20   Height:      Weight:      SpO2: 94% 97% 94% 97%   Weight change:   Intake/Output Summary (Last 24 hours) at 09/03/13 1625 Last data filed at 09/03/13 0700  Gross per 24 hour  Intake      0 ml  Output   1525 ml  Net  -1525 ml   Physical Exam: General: Alert, cooperative, and in no apparent distress, lethargic, falling asleep during interview. Visible UE tremor. HEENT: Vision grossly intact, oropharynx clear and non-erythematous  Neck: Full range of motion without pain, supple, no lymphadenopathy or carotid bruits Lungs: Clear to ascultation bilaterally, normal work of respiration, no wheezes, rales, ronchi Heart: Regular rate and rhythm, no murmurs, gallops, or rubs Abdomen: Soft, non-tender, non-distended, normal bowel sounds Extremities: No cyanosis, clubbing, or edema Neurologic: Alert & oriented X3, cranial nerves II-XII intact, strength grossly intact, sensation intact to light touch  Lab Results: Basic Metabolic Panel:  Recent Labs Lab 09/02/13 0445 09/03/13 0725  NA 142 143  K 4.1 3.7  CL 110 112  CO2 25 24  GLUCOSE 113* 80  BUN 14 12  CREATININE 0.81 0.96  CALCIUM 8.2* 8.7   Liver Function Tests:  Recent  Labs Lab 09/02/13 0445  AST 9  ALT 5  ALKPHOS 34*  BILITOT 0.2*  PROT 4.8*  ALBUMIN 2.3*   CBC:  Recent Labs Lab 09/01/13 1850  09/02/13 0445 09/03/13 0725  WBC 5.8  --  3.7* 7.3  NEUTROABS 3.4  --   --   --   HGB 13.0  < > 12.0 12.9  HCT 36.9  < > 35.0* 37.3  MCV 93.7  --  95.1 96.1  PLT 97*  --  88* 91*  < > = values in this interval not displayed. Cardiac Enzymes:  Recent Labs Lab 09/02/13 0054  TROPONINI <0.30   CBG:  Recent Labs Lab 09/02/13 1629 09/02/13 2020 09/03/13 0102 09/03/13 0411 09/03/13 0621 09/03/13 1115  GLUCAP 78 80 73 68* 70 96   Anemia Panel:  Recent Labs Lab 09/02/13 0445  VITAMINB12 653   Urine Drug Screen: Drugs of Abuse     Component Value Date/Time   LABOPIA NONE DETECTED 03/17/2013 2051   COCAINSCRNUR NONE DETECTED 03/17/2013 2051   LABBENZ NONE DETECTED 03/17/2013 2051   AMPHETMU POSITIVE* 03/17/2013 2051   THCU NONE DETECTED 03/17/2013 2051   LABBARB NONE DETECTED 03/17/2013 2051    Urinalysis:  Recent Labs Lab 09/01/13 2216  COLORURINE ORANGE*  LABSPEC 1.039*  PHURINE 6.0  GLUCOSEU NEGATIVE  HGBUR NEGATIVE  BILIRUBINUR MODERATE*  KETONESUR 15*  PROTEINUR 30*  UROBILINOGEN 1.0  NITRITE POSITIVE*  LEUKOCYTESUR MODERATE*  Studies/Results: Dg Chest 2 View  09/01/2013   CLINICAL DATA:  Back pain with shortness of breath and weakness after falling.  EXAM: CHEST  2 VIEW  COMPARISON:  Chest radiographs and CT 04/05/2011.  FINDINGS: The heart size and mediastinal contours are normal. The lungs are clear. There is no pleural effusion or pneumothorax. No acute osseous findings are identified. An apparent EKG snap overlies the anterior aspect of the right 3rd rib.  IMPRESSION: No active cardiopulmonary disease.   Electronically Signed   By: Roxy Horseman M.D.   On: 09/01/2013 19:32   Mr Brain Wo Contrast  09/03/2013   CLINICAL DATA:  Fall. Dizziness  EXAM: MRI HEAD WITHOUT CONTRAST  TECHNIQUE: Multiplanar, multisequence MR  imaging was performed. No intravenous contrast was administered.  COMPARISON:  CT head 04/30/2012  FINDINGS: Incomplete exam. The study was limited to axial T2, FLAIR, and diffusion.  Generalized atrophy. Negative for acute infarct. No significant chronic ischemia. Negative for mass or fluid collection.  IMPRESSION: Generalized atrophy. No acute abnormality.   Electronically Signed   By: Marlan Palau M.D.   On: 09/03/2013 10:04   Medications: I have reviewed the patient's current medications. Scheduled Meds: . acetaminophen  1,000 mg Oral TID   Or  . acetaminophen  975 mg Rectal TID  . ARIPiprazole  15 mg Oral Daily  . buPROPion  150 mg Oral Daily  . feeding supplement (RESOURCE BREEZE)  1 Container Oral TID BM  . fludrocortisone  0.1 mg Oral Daily  . gabapentin  300 mg Oral TID  . levothyroxine  75 mcg Oral QAC breakfast  . liothyronine  5 mcg Oral Daily  . LORazepam      . LORazepam      . nitrofurantoin (macrocrystal-monohydrate)  100 mg Oral Q12H  . pantoprazole  40 mg Oral Daily  . simvastatin  40 mg Oral QPC supper  . sodium chloride  3 mL Intravenous Q12H  . [START ON 09/04/2013] traZODone  200 mg Oral QHS   Continuous Infusions: . dextrose 5 % and 0.9% NaCl 75 mL/hr at 09/03/13 0515   PRN Meds:.ondansetron (ZOFRAN) IV  Assessment/Plan: [name] is a 42 y.o. female w/ PMHx of Bipolar Disorder, Hypothyroidism, HLD, GERD, presents to the ED w/ complaints of recurrent falls over the past 1-2 months.  Frequent falls- Patient has a history of frequent falls, she was not orthostatic by vital signs but subjectively she is orthostatic at home. She has been prescribed florinef by her PCP due to suspicion medication induced orthostasis but has not been taking this medication. She does not report LOC or trauma to her head during her fall, she does have some low back pain but no spinous process tenderness- will hold off any imaging of her lumbar spine at this time. Her  dizziness is less likely vertigo given her description of her dizziness. Her UTI could be a pontential cause however this is unlikely to have been ongoing for 1.5 months. Hypoglycemia is a concern given her low normal CBGs recently and her history of decreased PO intake. Her psychiatric medications could also be sedating and causing her these frequent falls however she reports no recent changes to her psychiatric meds. Her head CT in 04/2013 did note cerebellar volume loss, this could be a contributing factor, she denies any ETOH use. Also taking Depakote. - MRI brain shows generalized atrophy. No acute abnormality.  - Continue Florinef 0.1 mg daily  - IVF D5NS at 75cc, will monitor  CBGs  - B12, Thiamine, Copper, Vitamin E and Vitamin D pending - Neurology consulted, feel that Depakote may be at toxic levels as it is high normal. Free Depakote level pending. Will consider changing mood stabilizing agents such as Lamictal. Will readdress this issue with psych as it was not addressed in their note.  - PT recommended SNF placement given her instability but patient refuses. Will readdress this issue with PT and consider other options such as home health.   Hypothyroidism- Restarted home medications. Synthroid daily + Liothronine daily. - TSH shown to be 0.033, just below LLN.  - Ordered fT4 and T3 for further workup.   HYPERCHOLESTEROLEMIA  - Continue simvastatin   DISORDER, BIPOLAR NOS  - Continue home medications of Abilify, Wellbutrin, Trazodone    GERD  - Protonix 40mg    UTI (lower urinary tract infection)- Patient still complaining of dysuria and increased frequency, w/ some mild urinary incontinence lately.  - Patient reports completing Keflex which recent culture showed sensitivity of E. Coli and Klebsiella Pneumo were sensitive to. However patient continues to report dysuria and U/A is consistent with a UTI.  - Continue Macrobid po for 3 more days.  - Should consider urology  referral on an outpatient basis.   Dispo: Disposition is deferred at this time, awaiting improvement of current medical problems.  Anticipated discharge in approximately 1-2 day(s).   The patient does have a current PCP Linward Headland, MD) and does need an [facility] hospital follow-up appointment after discharge.  The patient does not have transportation limitations that hinder transportation to clinic appointments.  .Services Needed at time of discharge: Y = Yes, Blank = No PT:   OT:   RN:   Equipment:   Other:     LOS: 2 days   Courtney Paris, MD 09/03/2013, 4:25 PM Pager: [phone]

## 2013-09-03 NOTE — Progress Notes (Signed)
Clinical Social Work Department BRIEF PSYCHOSOCIAL ASSESSMENT 09/03/2013  Patient:  [name], [name]     Account Number:  [id number]     Admit date:  09/01/2013  Clinical Social Worker:  Carren Rang  Date/Time:  09/03/2013 03:13 PM  Referred by:  Physician  Date Referred:  09/03/2013 Referred for  SNF Placement   Other Referral:   Interview type:  Patient Other interview type:    PSYCHOSOCIAL DATA Living Status:  ALONE Admitted from facility:   Level of care:   Primary support name:  Verdell Carmine Primary support relationship to patient:  PARENT Degree of support available:   Good    CURRENT CONCERNS Current Concerns  Post-Acute Placement   Other Concerns:    SOCIAL WORK ASSESSMENT / PLAN Clinical Social Worker received referral for SNF placement at d/c. CSW introduced self and explained reason for visit. CSW explained SNF process to patient.  Patient reported she does not want SNF placement and was refusing placement. Patient alert and oriented x4. CSW suggested Home Health to patient, patient appeared open to the idea. CSW referred this to CM. This CSW signing off. Psych csw to follow if needed.   Assessment/plan status:  No Further Intervention Required Other assessment/ plan:   Information/referral to community resources:   SNF information    PATIENT'S/FAMILY'S RESPONSE TO PLAN OF CARE: Patient refusing SNF placement at this time.       Maree Krabbe, MSW, Theresia Majors [phone]

## 2013-09-03 NOTE — Consult Note (Signed)
Reason for Consult: Bipolar disorder and postpartum depression it medication management Referring Physician: Dr. Santiago Bumpers is an 42 y.o. female.   HPI: Psychiatric liaison consultation requested regarding bipolar disorder and possibly be of overmedication. Patient was seen and chart reviewed. Patient reported she was use to be seen by family medicine physician at [facility] and recently she was referred to the [facility] Patient has been taking her medication as prescribed and sometimes she has been feeling dizziness and also frequent falls. Patient denied current symptoms of depression, mania, psychosis and requested to just her medication as clinically required. Patient denied suicidal ideation, homicidal ideation, intentions and plans. Patient has no evidence of psychotic symptoms. Patient has no previous history of acute psychiatric hospitalizations. Patient stated she has been taking trazodone 100 mg 2 pills at bedtime and does not need during daytime.  Medical history: [name] is a 42 year old female with a PMH of Bipolar disorder, Hypothyroidism, HLD, GERD. She reports she recently finished a course of Keflex antibiotics for a UTI. She takes Azo for UTIs but reports she still has dysuria. No fevers/chills. Has been compliant with meds. Has been feeling depressed lately due to frequent falls.   Mental Status Examination: Patient appeared as per his stated age and fairly groomed, and has good eye contact. Patient has good mood and his affect was appropriate and bright. He has normal rate, rhythm, and volume of speech. His thought process is linear and goal directed. Patient has denied suicidal, homicidal ideations, intentions or plans. Patient has no evidence of auditory or visual hallucinations, delusions, and paranoia. Patient has fair insight judgment and impulse control.  Past Medical History  Diagnosis Date  . Deep venous thrombosis of leg 2012     completed year of coumadin   . Hx of measles   . Migraines   . Monilia infection 12/31/10  . Vaginal atrophy 03/04/11  . Dyspareunia 03/04/11  . Bipolar 1 disorder     Followed by Mental Health.  All psychiatric medications prescribed by Mental Health.  Stable for many years.    . Hypothyroidism     Hypothyroidism since 8th grade.  Managed by Dr. Sharl Ma, Endocrinology.  On synthroid.  No prior thyroid surgery/ablation.     Past Surgical History  Procedure Laterality Date  . 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 Cesarean section  1998  . Tonsillectomy    . Wisdom tooth extraction    . Cholecystectomy    . Left elbow    . Abdominal hysterectomy  2000  . Appendectomy      Family History  Problem Relation Age of Onset  . Cancer Maternal Aunt     Social History:  reports that she quit smoking about 10 months ago. Her smoking use included Cigarettes. She has a 12.5 pack-year smoking history. She has never used smokeless tobacco. She reports that she does not drink alcohol or use illicit drugs.  Allergies:  Allergies  Allergen Reactions  . Codeine     Nausea and vomiting  . Meloxicam Nausea Only  . Morphine And Related     Makes me loopy and hallucinates  . Naproxen     Rash and nausea  . Sulfonamide Derivatives     Nausea and rash    Medications: I have reviewed the patient's current medications.  Results for orders placed during the hospital encounter of 09/01/13 (from the past 48 hour(s))  CBC WITH DIFFERENTIAL     Status: Abnormal   Collection Time    09/01/13  6:50 PM      Result Value Range   WBC 5.8  4.0 - 10.5 K/uL   RBC 3.94  3.87 - 5.11 MIL/uL   Hemoglobin 13.0  12.0 - 15.0 g/dL   HCT 16.1  09.6 - 04.5 %   MCV 93.7  78.0 - 100.0 fL   MCH 33.0  26.0 - 34.0 pg   MCHC 35.2  30.0 - 36.0 g/dL   RDW 40.9  81.1 - 91.4 %   Platelets 97 (*) 150 - 400 K/uL   Comment: CONSISTENT WITH PREVIOUS RESULT   Neutrophils Relative % 58  43 - 77 %   Neutro Abs 3.4   1.7 - 7.7 K/uL   Lymphocytes Relative 30  12 - 46 %   Lymphs Abs 1.7  0.7 - 4.0 K/uL   Monocytes Relative 12  3 - 12 %   Monocytes Absolute 0.7  0.1 - 1.0 K/uL   Eosinophils Relative 0  0 - 5 %   Eosinophils Absolute 0.0  0.0 - 0.7 K/uL   Basophils Relative 0  0 - 1 %   Basophils Absolute 0.0  0.0 - 0.1 K/uL  POCT I-STAT TROPONIN I     Status: None   Collection Time    09/01/13  7:08 PM      Result Value Range   Troponin i, poc 0.00  0.00 - 0.08 ng/mL   Comment 3            Comment: Due to the release kinetics of cTnI,     a negative result within the first hours     of the onset of symptoms does not rule out     myocardial infarction with certainty.     If myocardial infarction is still suspected,     repeat the test at appropriate intervals.  POCT I-STAT, CHEM 8     Status: Abnormal   Collection Time    09/01/13  7:10 PM      Result Value Range   Sodium 144  135 - 145 mEq/L   Potassium 3.1 (*) 3.5 - 5.1 mEq/L   Chloride 102  96 - 112 mEq/L   BUN 14  6 - 23 mg/dL   Creatinine, Ser 7.82  0.50 - 1.10 mg/dL   Glucose, Bld 72  70 - 99 mg/dL   Calcium, Ion 9.56 (*) 1.12 - 1.23 mmol/L   TCO2 26  0 - 100 mmol/L   Hemoglobin 12.6  12.0 - 15.0 g/dL   HCT 21.3  08.6 - 57.8 %  URINALYSIS, ROUTINE W REFLEX MICROSCOPIC     Status: Abnormal   Collection Time    09/01/13 10:16 PM      Result Value Range   Color, Urine ORANGE (*) YELLOW   Comment: BIOCHEMICALS MAY BE AFFECTED BY COLOR   APPearance CLOUDY (*) CLEAR   Specific Gravity, Urine 1.039 (*) 1.005 - 1.030   pH 6.0  5.0 - 8.0   Glucose, UA NEGATIVE  NEGATIVE mg/dL   Hgb urine dipstick NEGATIVE  NEGATIVE   Bilirubin Urine MODERATE (*) NEGATIVE   Ketones, ur 15 (*) NEGATIVE mg/dL   Protein, ur 30 (*) NEGATIVE mg/dL   Urobilinogen, UA 1.0  0.0 - 1.0 mg/dL   Nitrite POSITIVE (*) NEGATIVE   Leukocytes, UA MODERATE (*) NEGATIVE  URINE MICROSCOPIC-ADD ON     Status: Abnormal  Collection Time    09/01/13 10:16 PM      Result  Value Range   Squamous Epithelial / LPF MANY (*) RARE   WBC, UA 7-10  <3 WBC/hpf   Bacteria, UA MANY (*) RARE   Crystals CA OXALATE CRYSTALS (*) NEGATIVE  URINE CULTURE     Status: None   Collection Time    09/01/13 10:16 PM      Result Value Range   Specimen Description URINE, RANDOM     Special Requests NONE     Culture  Setup Time       Value: 09/02/2013 00:22     Performed at [facility] Count       Value: 20,OOO COLONIES/ML     Performed at [facility]   Culture       Value: GRAM NEGATIVE RODS     Performed at [facility]   Report Status PENDING    POCT PREGNANCY, URINE     Status: None   Collection Time    09/01/13 10:17 PM      Result Value Range   Preg Test, Ur NEGATIVE  NEGATIVE   Comment:            THE SENSITIVITY OF THIS     METHODOLOGY IS >24 mIU/mL  TROPONIN I     Status: None   Collection Time    09/02/13 12:54 AM      Result Value Range   Troponin I <0.30  <0.30 ng/mL   Comment:            Due to the release kinetics of cTnI,     a negative result within the first hours     of the onset of symptoms does not rule out     myocardial infarction with certainty.     If myocardial infarction is still suspected,     repeat the test at appropriate intervals.  GLUCOSE, CAPILLARY     Status: Abnormal   Collection Time    09/02/13  2:58 AM      Result Value Range   Glucose-Capillary 103 (*) 70 - 99 mg/dL  COPPER, SERUM     Status: Abnormal   Collection Time    09/02/13  4:45 AM      Result Value Range   Copper 55 (*) 70 - 175 mcg/dL   Comment: Performed at [facility]  VITAMIN B12     Status: None   Collection Time    09/02/13  4:45 AM      Result Value Range   Vitamin B-12 653  211 - 911 pg/mL   Comment: Performed at [facility]  VITAMIN D 25 HYDROXY     Status: None   Collection Time    09/02/13  4:45 AM      Result Value Range   Vit D, 25-Hydroxy 44  30 - 89 ng/mL   Comment: (NOTE)     This  assay accurately quantifies Vitamin D, which is the sum of the     25-Hydroxy forms of Vitamin D2 and D3.  Studies have shown that the     optimum concentration of 25-Hydroxy Vitamin D is 30 ng/mL or higher.      Concentrations of Vitamin D between 20 and 29 ng/mL are considered to     be insufficient and concentrations less than 20 ng/mL are considered     to be deficient for Vitamin D.  Performed at [facility]  COMPREHENSIVE METABOLIC PANEL     Status: Abnormal   Collection Time    09/02/13  4:45 AM      Result Value Range   Sodium 142  135 - 145 mEq/L   Potassium 4.1  3.5 - 5.1 mEq/L   Comment: DELTA CHECK NOTED   Chloride 110  96 - 112 mEq/L   CO2 25  19 - 32 mEq/L   Glucose, Bld 113 (*) 70 - 99 mg/dL   BUN 14  6 - 23 mg/dL   Creatinine, Ser 1.61  0.50 - 1.10 mg/dL   Calcium 8.2 (*) 8.4 - 10.5 mg/dL   Total Protein 4.8 (*) 6.0 - 8.3 g/dL   Albumin 2.3 (*) 3.5 - 5.2 g/dL   AST 9  0 - 37 U/L   ALT 5  0 - 35 U/L   Alkaline Phosphatase 34 (*) 39 - 117 U/L   Total Bilirubin 0.2 (*) 0.3 - 1.2 mg/dL   GFR calc non Af Amer 87 (*) >90 mL/min   GFR calc Af Amer >90  >90 mL/min   Comment: (NOTE)     The eGFR has been calculated using the CKD EPI equation.     This calculation has not been validated in all clinical situations.     eGFR's persistently <90 mL/min signify possible Chronic Kidney     Disease.  CBC     Status: Abnormal   Collection Time    09/02/13  4:45 AM      Result Value Range   WBC 3.7 (*) 4.0 - 10.5 K/uL   RBC 3.68 (*) 3.87 - 5.11 MIL/uL   Hemoglobin 12.0  12.0 - 15.0 g/dL   HCT 09.6 (*) 04.5 - 40.9 %   MCV 95.1  78.0 - 100.0 fL   MCH 32.6  26.0 - 34.0 pg   MCHC 34.3  30.0 - 36.0 g/dL   RDW 81.1  91.4 - 78.2 %   Platelets 88 (*) 150 - 400 K/uL   Comment: CONSISTENT WITH PREVIOUS RESULT  GLUCOSE, CAPILLARY     Status: Abnormal   Collection Time    09/02/13  8:03 AM      Result Value Range   Glucose-Capillary 116 (*) 70 - 99 mg/dL  VALPROIC  ACID LEVEL     Status: None   Collection Time    09/02/13  9:00 AM      Result Value Range   Valproic Acid Lvl 84.1  50.0 - 100.0 ug/mL  GLUCOSE, CAPILLARY     Status: None   Collection Time    09/02/13 11:25 AM      Result Value Range   Glucose-Capillary 90  70 - 99 mg/dL  TSH     Status: Abnormal   Collection Time    09/02/13  2:00 PM      Result Value Range   TSH 0.033 (*) 0.350 - 4.500 uIU/mL   Comment: Performed at [facility]  GLUCOSE, CAPILLARY     Status: None   Collection Time    09/02/13  4:29 PM      Result Value Range   Glucose-Capillary 78  70 - 99 mg/dL  GLUCOSE, CAPILLARY     Status: None   Collection Time    09/02/13  8:20 PM      Result Value Range   Glucose-Capillary 80  70 - 99 mg/dL  AMMONIA     Status: None   Collection Time  09/02/13  8:55 PM      Result Value Range   Ammonia 37  11 - 60 umol/L  GLUCOSE, CAPILLARY     Status: None   Collection Time    09/03/13  1:02 AM      Result Value Range   Glucose-Capillary 73  70 - 99 mg/dL  GLUCOSE, CAPILLARY     Status: Abnormal   Collection Time    09/03/13  4:11 AM      Result Value Range   Glucose-Capillary 68 (*) 70 - 99 mg/dL  GLUCOSE, CAPILLARY     Status: None   Collection Time    09/03/13  6:21 AM      Result Value Range   Glucose-Capillary 70  70 - 99 mg/dL  CBC     Status: Abnormal   Collection Time    09/03/13  7:25 AM      Result Value Range   WBC 7.3  4.0 - 10.5 K/uL   RBC 3.88  3.87 - 5.11 MIL/uL   Hemoglobin 12.9  12.0 - 15.0 g/dL   HCT 16.1  09.6 - 04.5 %   MCV 96.1  78.0 - 100.0 fL   MCH 33.2  26.0 - 34.0 pg   MCHC 34.6  30.0 - 36.0 g/dL   RDW 40.9  81.1 - 91.4 %   Platelets 91 (*) 150 - 400 K/uL   Comment: CONSISTENT WITH PREVIOUS RESULT  BASIC METABOLIC PANEL     Status: Abnormal   Collection Time    09/03/13  7:25 AM      Result Value Range   Sodium 143  135 - 145 mEq/L   Potassium 3.7  3.5 - 5.1 mEq/L   Chloride 112  96 - 112 mEq/L   CO2 24  19 - 32 mEq/L    Glucose, Bld 80  70 - 99 mg/dL   BUN 12  6 - 23 mg/dL   Creatinine, Ser 7.82  0.50 - 1.10 mg/dL   Calcium 8.7  8.4 - 95.6 mg/dL   GFR calc non Af Amer 71 (*) >90 mL/min   GFR calc Af Amer 82 (*) >90 mL/min   Comment: (NOTE)     The eGFR has been calculated using the CKD EPI equation.     This calculation has not been validated in all clinical situations.     eGFR's persistently <90 mL/min signify possible Chronic Kidney     Disease.  GLUCOSE, CAPILLARY     Status: None   Collection Time    09/03/13 11:15 AM      Result Value Range   Glucose-Capillary 96  70 - 99 mg/dL    Dg Chest 2 View  21/12/863   CLINICAL DATA:  Back pain with shortness of breath and weakness after falling.  EXAM: CHEST  2 VIEW  COMPARISON:  Chest radiographs and CT 04/05/2011.  FINDINGS: The heart size and mediastinal contours are normal. The lungs are clear. There is no pleural effusion or pneumothorax. No acute osseous findings are identified. An apparent EKG snap overlies the anterior aspect of the right 3rd rib.  IMPRESSION: No active cardiopulmonary disease.   Electronically Signed   By: Roxy Horseman M.D.   On: 09/01/2013 19:32   Mr Brain Wo Contrast  09/03/2013   CLINICAL DATA:  Fall. Dizziness  EXAM: MRI HEAD WITHOUT CONTRAST  TECHNIQUE: Multiplanar, multisequence MR imaging was performed. No intravenous contrast was administered.  COMPARISON:  CT head 04/30/2012  FINDINGS: Incomplete  exam. The study was limited to axial T2, FLAIR, and diffusion.  Generalized atrophy. Negative for acute infarct. No significant chronic ischemia. Negative for mass or fluid collection.  IMPRESSION: Generalized atrophy. No acute abnormality.   Electronically Signed   By: Marlan Palau M.D.   On: 09/03/2013 10:04    Positive for bipolar  Blood pressure 117/73, pulse 58, temperature 98.4 F (36.9 C), temperature source Oral, resp. rate 20, height 5\' 5"  (1.651 m), weight 68.947 kg (152 lb), SpO2  97.00%.   Assessment/Plan: Bipolar disorder  Recommendation: Discontinue Trazodone during day time and take trazodone 200 mg only at night for sleep Continue rest of the psychiatric medication without changes Patient does not meet criteria for acute psychiatric hospitalization Followup with the [facility] for outpatient psychiatric care Appreciate psychiatric liaison consultation and may contact [phone] if we can be of further help   Joakim Huesman,JANARDHAHA R. 09/03/2013, 2:45 PM

## 2013-09-03 NOTE — Progress Notes (Signed)
Occupational Therapy Treatment Patient Details Name: DREAM HARMAN MRN: [id number] DOB: [birth year] Today's Date: 09/03/2013 Time: 4098-1191 OT Time Calculation (min): 11 min  OT Assessment / Plan / Recommendation  History of present illness 42 y.o. female admitted to [facility] on 09/01/13 with fall in the bathroom at her home.  Pt with hx bipolar disorder, hypothyroidism HL.  She was recently on antibiotics for UTI.     OT comments  Pt moving better than yesterday.  She refused to participate in ADL activities.  Pt with c/o back pain 10/10 (RN notified) limiting all activity.  Pt instructed in log rolling technique to reduce back pain, but moves impulsively and is inpatient - disregards instruction.  She requires min A for balance, and will require 24 hour assist/supervision at discharge to prevent further falls.  She reports her father will be able to provide this level of assist at discharge.   Follow Up Recommendations  SNF;Supervision/Assistance - 24 hour    Barriers to Discharge       Equipment Recommendations  None recommended by OT    Recommendations for Other Services    Frequency Min 2X/week   Progress towards OT Goals Progress towards OT goals: Progressing toward goals  Plan Discharge plan remains appropriate    Precautions / Restrictions Precautions Precautions: Fall   Pertinent Vitals/Pain     ADL  Toilet Transfer: Minimal assistance Toilet Transfer Method: Sit to stand Toilet Transfer Equipment: Comfort height toilet;Grab bars Toileting - Clothing Manipulation and Hygiene: Minimal assistance Where Assessed - Toileting Clothing Manipulation and Hygiene: Standing Transfers/Ambulation Related to ADLs: min A  ADL Comments: Pt would not attempt LB ADLs.  Pt instructed in log rolling techniques to ease back pain, but pt very resistive to instruction.  Pt is impulsive and impatient.  She insists that her father will be available to provide 24 hour assist at discharge.      OT Diagnosis:    OT Problem List:   OT Treatment Interventions:     OT Goals(current goals can now be found in the care plan section) ADL Goals Pt Will Perform Grooming: sitting;with min guard assist Pt Will Perform Upper Body Bathing: with min assist;sitting Pt Will Perform Lower Body Bathing: with mod assist;sit to/from stand Pt Will Perform Upper Body Dressing: with min assist;sitting Pt Will Perform Lower Body Dressing: with mod assist;sit to/from stand Pt Will Transfer to Toilet: with min assist;bedside commode;stand pivot transfer Pt Will Perform Toileting - Clothing Manipulation and hygiene: with mod assist;sit to/from stand Additional ADL Goal #1: Pt will maintain sustained attention to simple BADL tasks x 5 mins  Visit Information  Last OT Received On: 09/03/13 Assistance Needed: +1 History of Present Illness: 41 y.o. female admitted to [facility] on 09/01/13 with fall in the bathroom at her home.  Pt with hx bipolar disorder, hypothyroidism HL.  She was recently on antibiotics for UTI.      Subjective Data      Prior Functioning       Cognition  Cognition Arousal/Alertness: Awake/alert Behavior During Therapy: Flat affect Overall Cognitive Status: No family/caregiver present to determine baseline cognitive functioning Area of Impairment: Attention;Safety/judgement Current Attention Level: Sustained Safety/Judgement: Decreased awareness of safety    Mobility  Bed Mobility Bed Mobility: Rolling Right;Right Sidelying to Sit;Sitting - Scoot to [place] of Bed;Sit to Supine Rolling Right: 4: Min guard Right Sidelying to Sit: 4: Min assist;HOB flat Sitting - Scoot to Edge of Bed: 6: Modified independent (Device/Increase time);With rail Sit to  Supine: 5: Supervision;HOB flat Details for Bed Mobility Assistance: Pt instructed in log rolling to ease back pain.  Pt. required assist to lift shoulders from bed, and returned to supine at the edge of the bed - supervision provided to  prevent fall  Transfers Transfers: Sit to Stand;Stand to Sit Sit to Stand: 4: Min assist;With upper extremity assist;From bed Stand to Sit: 4: Min assist;With upper extremity assist;To bed Details for Transfer Assistance: Min A due to balance and for safety    Exercises      Balance Balance Balance Assessed: Yes Static Sitting Balance Static Sitting - Balance Support: Bilateral upper extremity supported;Feet supported Static Sitting - Level of Assistance: 5: Stand by assistance Static Standing Balance Static Standing - Balance Support: Bilateral upper extremity supported Static Standing - Level of Assistance: 5: Stand by assistance Static Standing - Comment/# of Minutes: bil. hands on walker   End of Session OT - End of Session Equipment Utilized During Treatment: Rolling walker Activity Tolerance: Patient limited by pain Patient left: in bed;with call bell/phone within reach;with bed alarm set Nurse Communication: Patient requests pain meds  GO     Lurlean Kernen M 09/03/2013, 4:22 PM

## 2013-09-03 NOTE — Progress Notes (Signed)
NUTRITION CONSULT/FOLLOW UP  Intervention:    Resource Breeze 3 times daily (250 kcals, 9 gm protein per 8 fl oz carton)  If PO intake does not improve, recommend short-term EN support  RD to follow for nutrition care plan  Nutrition Dx:   Unintended weight loss related to unknown etiology, ? depression ? biting/chewing difficulty as evidenced by 23% weight loss, ongoing  Goal:   Pt to meet >/= 90% of their estimated nutrition needs, unmet  Monitor:   PO & supplemental intake, weight, labs, I/O's  Assessment:   Patient with PMH of bipolar disorder, hypothyroidism, GERD and HLD presented s/p fall; admitted to feeling dizzy when standing and diarrhea (was recently on ABX for UTI).  Initial nutrition assessment completed 11/3.  PO intake remains very poor.  0% per flowsheet records.  RD spoke with RN today -- patient does not like Ensure Complete supplements.  RD ordered Resource Breeze supplement to see if patient likes.  Height: Ht Readings from Last 1 Encounters:  09/02/13 5\' 5"  (1.651 m)    Weight Status:   Wt Readings from Last 1 Encounters:  09/02/13 152 lb (68.947 kg)    Re-estimated needs:  Kcal: 1700-1900 Protein: 80-90 gm Fluid: 1.7-1.9 L  Skin: Intact  Diet Order: Dysphagia 1, thin liquids   Intake/Output Summary (Last 24 hours) at 09/03/13 1553 Last data filed at 09/03/13 0700  Gross per 24 hour  Intake      0 ml  Output   1525 ml  Net  -1525 ml    Labs:   Recent Labs Lab 09/01/13 1910 09/02/13 0445 09/03/13 0725  NA 144 142 143  K 3.1* 4.1 3.7  CL 102 110 112  CO2  --  25 24  BUN 14 14 12   CREATININE 1.10 0.81 0.96  CALCIUM  --  8.2* 8.7  GLUCOSE 72 113* 80    CBG (last 3)   Recent Labs  09/03/13 0411 09/03/13 0621 09/03/13 1115  GLUCAP 68* 70 96    Scheduled Meds: . acetaminophen  1,000 mg Oral TID   Or  . acetaminophen  975 mg Rectal TID  . ARIPiprazole  15 mg Oral Daily  . buPROPion  150 mg Oral Daily  . feeding  supplement (RESOURCE BREEZE)  1 Container Oral TID BM  . fludrocortisone  0.1 mg Oral Daily  . gabapentin  300 mg Oral TID  . levothyroxine  75 mcg Oral QAC breakfast  . liothyronine  5 mcg Oral Daily  . LORazepam      . LORazepam      . nitrofurantoin (macrocrystal-monohydrate)  100 mg Oral Q12H  . pantoprazole  40 mg Oral Daily  . simvastatin  40 mg Oral QPC supper  . sodium chloride  3 mL Intravenous Q12H  . [START ON 09/04/2013] traZODone  200 mg Oral QHS    Continuous Infusions: . dextrose 5 % and 0.9% NaCl 75 mL/hr at 09/03/13 0515    Maureen Chatters, RD, LDN Pager #: [phone] After-Hours Pager #: [phone]

## 2013-09-03 NOTE — Progress Notes (Signed)
This note is for psych when they see the patient.  Pt is not to be started back on Depakote for concern for Depakote toxicity.    Per Neuro consider Lamictal if needed.  If Lamictal started start 25 mg every other day or discuss with pharmacy    Shirlee Latch MD  IM PGY 2
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
## 2013-09-03 NOTE — Progress Notes (Signed)
  Date: 09/03/2013  Patient name: [name]  Medical record number: [id number]  Date of birth: March 31, 1971   This patient has been seen and the plan of care was discussed with the house staff. Please see their note for complete details. I concur with their findings with the following additions/corrections: Appreciate neurology recommendations.  Continue to hold depakote given concern for toxicity. Agree with psych consult. We need to change her regimen, possibly adding Lamictal. She is more awake today.  Jonah Blue, DO, FACP Faculty [facility] Internal Medicine Residency Program 09/03/2013, 1:06 PM

## 2013-09-03 NOTE — Progress Notes (Signed)
I have cared for this patient the last two days. The patient refuses to eat any food. Each meal I spend time encouraging her to eat and stressing the importance of her eating. Patient states that she does "not feel like it" or that she "isn't hungry". The patient was ordered Boost plus shake yesterday. She stated that when she tried to drink it that it caused her stomach to hurt. I talked with the dietician and she ordered breeze instead. Patient seemed to like that better but did not drink the entire container. Patient seems to just not have any desire what so ever to eat. Stanton Kidney R

## 2013-09-04 LAB — GLUCOSE, CAPILLARY
Glucose-Capillary: 79 mg/dL (ref 70–99)
Glucose-Capillary: 81 mg/dL (ref 70–99)

## 2013-09-04 MED ORDER — NITROFURANTOIN MONOHYD MACRO 100 MG PO CAPS: 100.0000 mg | ORAL_CAPSULE | Freq: Two times a day (BID) | ORAL | Status: DC

## 2013-09-04 MED ORDER — TRAZODONE HCL 100 MG PO TABS: 100.0000 mg | ORAL_TABLET | Freq: Every day | ORAL | Status: DC

## 2013-09-04 MED ORDER — TRAZODONE HCL 100 MG PO TABS
100.0000 mg | ORAL_TABLET | Freq: Every day | ORAL | Status: DC
  Filled 2013-09-04: qty 1

## 2013-09-04 NOTE — Progress Notes (Signed)
Patient voiding in bed at times. Getting patient up now q 2-3 hrs to void. Patient cries at times for a brief period of time. Talking about her son who is 44 yrs old who is now living with his father as of a mth ago. She has not had any contact with him since. Patient stated," That is when I really started going down hill"tried to enc. Her and gave some emotional support. accu ck 79 i had to feed her a cup of ice cream but she did eat it all. Then patient ask. "Is that ice cream made with milk?" I told her yes. Then patient stated,"I am lactose intol.

## 2013-09-04 NOTE — Progress Notes (Signed)
Patients diet was changed from DYS 1 to regular. Patient stated that she does not like the DYS 1 diet, and would eat if she had a regular dinner tray. When patients tray came up from cafeteria, she stated, "I don't think I can do it". I asked her why and she stated, "I'm afraid of food." and that she has not been able to eat since having her dentures in. I encouraged patient to try the food anyways. She took 3 bites of her meal, and (which seems like she was purposely) proceeded to gag and then threw the the food back up." Patient said that she was sorry and couldn't eat right now. Stanton Kidney R

## 2013-09-04 NOTE — Progress Notes (Signed)
Went over discharge instructions with the patient. IV D/C'd and patient taken off the cardiac monitor. Patient had no additional questions or concerns related to discharged and was excited to be leaving. Patient discharged home. Stanton Kidney R

## 2013-09-04 NOTE — Progress Notes (Signed)
  Date: 09/04/2013  Patient name: [name]  Medical record number: [id number]  Date of birth: 1971-07-21   This patient has been seen and the plan of care was discussed with the house staff. Please see their note for complete details. I concur with their findings with the following additions/corrections: Much more awake today. Agrees with likely depakote toxicity causing frequent falls. PT consulted, patient does not want SNF, understands our best interest recommendation for her. Appreciate psych input. Otherwise, she is stable for discharge from medical perspective, will need home PT at a minimum and she understands this is not our recommendation (SNF is our recommendation).  Jonah Blue, DO, FACP Faculty [facility] Internal Medicine Residency Program 09/04/2013, 1:49 PM

## 2013-09-04 NOTE — Discharge Summary (Signed)
Name: [name] MRN: [id number] DOB: Apr 20, 1971 42 y.o. PCP: Linward Headland, MD  Date of Admission: 09/01/2013  5:35 PM Date of Discharge: 09/04/13 Attending Physician: Jonah Blue, DO  Discharge Diagnosis: 1. Frequent falls- Most likely 2/2 Depakote toxicity. Patient on many psychiatric medications for Bipolar Disorder. Seen at [facility] for this. During admission, felt that Depakote was responsible for clinical findings. Free Depakote level elevated to 23.8 (normal range 4.8-17.3). 2. UTI- Recently treated w/ Keflex for UCx +ve for E. Coli and Klebsiella. Still with urinary symptoms on this admission. UCx shows only 20,000 colonies of E. Coli, but given symptomatology, treated w/ Macrobid for 5 days.  3. Hypothryoidism 4. Bipolar Disorder  Discharge Medications:   Medication List    STOP taking these medications       divalproex 500 MG 24 hr tablet  Commonly known as:  DEPAKOTE ER      TAKE these medications       ARIPiprazole 15 MG tablet  Commonly known as:  ABILIFY  Take 15 mg by mouth daily.     buPROPion 150 MG 24 hr tablet  Commonly known as:  WELLBUTRIN XL  Take 150 mg by mouth daily.     fludrocortisone 0.1 MG tablet  Commonly known as:  FLORINEF  Take 1 tablet (0.1 mg total) by mouth daily.     gabapentin 300 MG capsule  Commonly known as:  NEURONTIN  Take 300 mg by mouth 3 (three) times daily.     levothyroxine 88 MCG tablet  Commonly known as:  SYNTHROID, LEVOTHROID  Take 88 mcg by mouth daily before breakfast.     liothyronine 5 MCG tablet  Commonly known as:  CYTOMEL  Take 5 mcg by mouth daily.     nitrofurantoin (macrocrystal-monohydrate) 100 MG capsule  Commonly known as:  MACROBID  Take 1 capsule (100 mg total) by mouth every 12 (twelve) hours.     ondansetron 4 MG disintegrating tablet  Commonly known as:  ZOFRAN ODT  Take 1 tablet (4 mg total) by mouth every 8 (eight) hours as needed for nausea.     pantoprazole 40 MG tablet    Commonly known as:  PROTONIX  Take 1 tablet (40 mg total) by mouth daily.     simvastatin 40 MG tablet  Commonly known as:  ZOCOR  Take 1 tablet (40 mg total) by mouth every morning.     traMADol 50 MG tablet  Commonly known as:  ULTRAM  Take 50 mg by mouth every 6 (six) hours as needed for pain.     traZODone 100 MG tablet  Commonly known as:  DESYREL  Take 1 tablet (100 mg total) by mouth at bedtime.     triamcinolone cream 0.1 %  Commonly known as:  KENALOG  Apply topically 2 (two) times daily. To red, itchy areas on neck and arms.  Do not use for more than 2 weeks        Disposition and follow-up:   [name] was discharged from [facility] in Good condition.  At the hospital follow up visit please address:  1.  Please follow-up balance issues, recent falls, care at home, support system? Patient refused SNF placement on discharge and was sent home with [facility] aide, PT, OT, and RN. Feeling better after discontinuing Depakote and decreasing Trazodone? Any urinary symptoms? Has she gone to her appointment at [facility] and discussed mood stabilizer in place of Depakote? Address LOW TSH, but  normal fT4. May need to make changes to thyroid medications at follow up.  2.  Labs / imaging needed at time of follow-up: UA/UCx   3.  Pending labs/ test needing follow-up: none  Follow-up Appointments:     Follow-up Information   Follow up with Janalyn Harder, MD On 09/11/2013. (2:15 PM)    Specialty:  Internal Medicine   Contact information:   [address] [place] [address] [phone]       Discharge Instructions: Discharge Orders   Future Appointments Provider Department Dept Phone   09/11/2013 2:15 PM Linward Headland, MD [facility] [phone]   Future Orders Complete By Expires   Call MD for:  extreme fatigue  As directed    Call MD for:  persistant dizziness or light-headedness  As directed    Call MD for:  severe  uncontrolled pain  As directed    Diet - low sodium heart healthy  As directed    Increase activity slowly  As directed       Consultations: Treatment Team:  Nehemiah Settle, MD  Procedures Performed:  Dg Chest 2 View  09/01/2013   CLINICAL DATA:  Back pain with shortness of breath and weakness after falling.  EXAM: CHEST  2 VIEW  COMPARISON:  Chest radiographs and CT 04/05/2011.  FINDINGS: The heart size and mediastinal contours are normal. The lungs are clear. There is no pleural effusion or pneumothorax. No acute osseous findings are identified. An apparent EKG snap overlies the anterior aspect of the right 3rd rib.  IMPRESSION: No active cardiopulmonary disease.   Electronically Signed   By: Roxy Horseman M.D.   On: 09/01/2013 19:32   Mr Brain Wo Contrast  09/03/2013   CLINICAL DATA:  Fall. Dizziness  EXAM: MRI HEAD WITHOUT CONTRAST  TECHNIQUE: Multiplanar, multisequence MR imaging was performed. No intravenous contrast was administered.  COMPARISON:  CT head 04/30/2012  FINDINGS: Incomplete exam. The study was limited to axial T2, FLAIR, and diffusion.  Generalized atrophy. Negative for acute infarct. No significant chronic ischemia. Negative for mass or fluid collection.  IMPRESSION: Generalized atrophy. No acute abnormality.   Electronically Signed   By: Marlan Palau M.D.   On: 09/03/2013 10:04   Admission HPI:  [name] is a 42 year old female with a PMH of Bipolar disorder, Hypothyroidism, HLD, GERD. She presents to the [facility] after a fall at home. She reports that she was at home when she was returning to the bathroom after having left her rings in the bathroom. She reports on her way back to the bathroom she fell on her back, she cannot recall how she fell, denies LOC, did not hit head; she reports she has been having frequent falls over the past 1.5 months, her last fall was 4 days prior. She occasionally feels like she sometimes has panic attacks prior to fall; she  does feel dizzy after standing and reports decreased PO intake but tries to stay hydrated with juices. No nausea, vomiting but +diarrhea to the point that she cannot make it to the restroom (this has been ongoing for the last month); 3 episodes of loose stools/d, has not seen blood/dark tar in stool, but +blood in urine. She reports she recently finished a course of Keflex antibiotics for a UTI. She takes Azo for UTIs but reports she still has dysuria. No fevers/chills. Has been compliant with meds. Has been feeling depressed lately due to frequent falls. No SI.  Hospital Course by problem list:   1. Frequent falls- Patient reports having recent falling episodes over the past 1-2 months. She claims she has fallen several times in her home and feels unstable on her feet. She says sometimes these falls are preceded by a minor anxiety attack. She also complains of associated dizziness, lightheadedness, and weakness. She also says she has been having recent fatigue, irritability, decreased appetite, mild nausea, and recent UE tremor. Orthostatics on admission -ve. Denied any LOC or trauma during recent fall, but does have an old bruise on her chin from a prior episode. Patient also reports having low normal CBG's at home, most likely 2/2 decreased po intake. Neurology consulted, suggested Depakote toxicity. Depakote level was 84.1, which is high normal, however, albumin was slightly low, therefore may be resulting in toxic (protein un-bound) levels. Free depakote level collected and was 23.8, above the ULN. MRI was also performed to rule out other structural issues, which  Only showed generalized atrophy w/ no acute abnormality. Psychiatry was also consulted and suggested decreasing Trazodone to 100 mg po qhs and holding Depakote until she can be seen for follow up at [facility]. Did not suggest replacing with a different mood stabilizer at this time (spoke with Dr. Elsie Saas over the phone, giving me these  specific recs, not specified in the note). Patient started to feel better towards the end of her discharge, much less sedated, but still with mild tremor and decreased appetite. Seen by PT/OT during admission, who suggested SNF placement given her instability. However, after discussion with the patient, she strongly refused going to a skilled nursing facility, becoming extremely tearful and upset, saying that she enjoys living by herself at home and doesn't want to be "sent away". Discussed at length with her parents (mother and step-father), and they felt that SNF was also beneficial for her but understood that she had refused this. Consulted case management for home health PT, OT, HH aide, and RN for extra supervision as her parents felt they that could use some help in looking after her. Discharged home with [facility] follow up and [facility] follow up.   2. UTI- Recently treated w/ Keflex for UCx +ve for E. Coli and Klebsiella. Still with urinary symptoms on this admission. UCx shows only 20,000 colonies of E. Coli, but given symptomatology, treated w/ Macrobid for total of 5 days. Was having moderate urinary incontinence during her admission, 2/2 increased frequency and urgency, according to the patient. No accompanying neurological abnormalities. Most likely 2/2 UTI. Says she was improving by the end of her admission.   3. Hypothyroidism- Patient with a history of hypothyroidism, on Synthroid 88 mcg in AM + Liothyronine 5 mcg qd at home. These medications were continued on admission. TSH during admission shown to be 0.033, much lower than normal values, however, fT4 of 1.03, which is wnl. Total T3 was low at 69.0. She will need further workup as an outpatient regarding her thyroid medications and if changes need to be made.   4. Bipolar Disorder- Patient follows at [facility] for psychiatric issues. She is on many medications for this. Continued on Abilify, Wellbutrin, and Trazodone. Depakote was held (see above).  Psychiatry was consulted and initial recs were simply to decrease Trazodone to 200 mg qhs instead of previous dose. Called Dr. Gerre Scull and discussed the patient's clinical picture, felt that holding Depakote was reasonable and suggested not starting a new mood stabilizer until follow up with [facility]. Also felt that Trazodone could be further decreased  to 100 qhs. Patient to follow up with [facility] week following discharge.   Discharge Vitals:   BP 101/69  Pulse 62  Temp(Src) 98.4 F (36.9 C) (Oral)  Resp 18  Ht 5\' 5"  (1.651 m)  Wt 152 lb (68.947 kg)  BMI 25.29 kg/m2  SpO2 96%  Discharge Labs:  Results for orders placed during the hospital encounter of 09/01/13 (from the past 24 hour(s))  GLUCOSE, CAPILLARY     Status: None   Collection Time    09/03/13  9:03 PM      Result Value Range   Glucose-Capillary 85  70 - 99 mg/dL   Comment 1 Notify RN     Comment 2 Documented in Chart    GLUCOSE, CAPILLARY     Status: None   Collection Time    09/04/13 12:00 AM      Result Value Range   Glucose-Capillary 87  70 - 99 mg/dL  GLUCOSE, CAPILLARY     Status: None   Collection Time    09/04/13  4:13 AM      Result Value Range   Glucose-Capillary 79  70 - 99 mg/dL   Comment 1 Documented in Chart     Comment 2 Notify RN    T4, FREE     Status: None   Collection Time    09/04/13  4:30 AM      Result Value Range   Free T4 1.03  0.80 - 1.80 ng/dL  T3     Status: Abnormal   Collection Time    09/04/13  4:30 AM      Result Value Range   T3, Total 69.0 (*) 80.0 - 204.0 ng/dl  GLUCOSE, CAPILLARY     Status: None   Collection Time    09/04/13 11:32 AM      Result Value Range   Glucose-Capillary 81  70 - 99 mg/dL    Signed: Courtney Paris, MD 09/04/2013, 4:54 PM   Time Spent on Discharge: 35 minutes Services Ordered on Discharge: HHPT, OT, HHRN, HH aide Equipment Ordered on Discharge: none

## 2013-09-04 NOTE — Progress Notes (Signed)
Subjective: [name] is a 42 y.o. female w/ PMHx of Bipolar Disorder, Hypothyroidism, HLD, GERD, presents to the ED w/ complaints of recurrent falls over the past 1-2 months.  Patient seen at bedside today. Says she is feeling much better. Still with some back pain, but otherwise is ready to go home.  Discussed with patient that our ultimate recommendation is for her to go to SNF but she is not interested and is refusing. Pt saw patient again and feel that Home Health PT/OT, home health aide, and RN should all be utilized as patient lives alone at this time.  Still with poor appetite.  Objective: Vital signs in last 24 hours: Filed Vitals:   09/03/13 2003 09/03/13 2004 09/03/13 2007 09/04/13 0415  BP: 101/70 105/72 100/67 111/75  Pulse: 86 88 97 66  Temp:    97.9 F (36.6 C)  TempSrc:    Oral  Resp: 18 18 18 18   Height:      Weight:      SpO2: 100% 99% 99% 96%   Weight change:   Intake/Output Summary (Last 24 hours) at 09/04/13 1556 Last data filed at 09/04/13 0640  Gross per 24 hour  Intake    960 ml  Output      0 ml  Net    960 ml   Physical Exam: General: Alert, cooperative, and in no apparent distress. Still with mild tremor HEENT: Vision grossly intact, oropharynx clear and non-erythematous  Neck: Full range of motion without pain, supple, no lymphadenopathy or carotid bruits Lungs: Clear to ascultation bilaterally, normal work of respiration, no wheezes, rales, ronchi Heart: Regular rate and rhythm, no murmurs, gallops, or rubs Abdomen: Soft, non-tender, non-distended, normal bowel sounds Extremities: No cyanosis, clubbing, or edema Neurologic: Alert & oriented X3, cranial nerves II-XII intact, strength grossly intact, sensation intact to light touch  Lab Results: Basic Metabolic Panel:  Recent Labs Lab 09/02/13 0445 09/03/13 0725  NA 142 143  K 4.1 3.7  CL 110 112  CO2 25 24  GLUCOSE 113* 80  BUN 14 12  CREATININE 0.81 0.96  CALCIUM 8.2*  8.7   Liver Function Tests:  Recent Labs Lab 09/02/13 0445  AST 9  ALT 5  ALKPHOS 34*  BILITOT 0.2*  PROT 4.8*  ALBUMIN 2.3*   CBC:  Recent Labs Lab 09/01/13 1850  09/02/13 0445 09/03/13 0725  WBC 5.8  --  3.7* 7.3  NEUTROABS 3.4  --   --   --   HGB 13.0  < > 12.0 12.9  HCT 36.9  < > 35.0* 37.3  MCV 93.7  --  95.1 96.1  PLT 97*  --  88* 91*  < > = values in this interval not displayed. Cardiac Enzymes:  Recent Labs Lab 09/02/13 0054  TROPONINI <0.30   CBG:  Recent Labs Lab 09/03/13 1115 09/03/13 1626 09/03/13 2103 09/04/13 09/04/13 0413 09/04/13 1132  GLUCAP 96 81 85 87 79 81   Anemia Panel:  Recent Labs Lab 09/02/13 0445  VITAMINB12 653   Urine Drug Screen: Drugs of Abuse     Component Value Date/Time   LABOPIA NONE DETECTED 03/17/2013 2051   COCAINSCRNUR NONE DETECTED 03/17/2013 2051   LABBENZ NONE DETECTED 03/17/2013 2051   AMPHETMU POSITIVE* 03/17/2013 2051   THCU NONE DETECTED 03/17/2013 2051   LABBARB NONE DETECTED 03/17/2013 2051    Urinalysis:  Recent Labs Lab 09/01/13 2216  COLORURINE ORANGE*  LABSPEC 1.039*  PHURINE 6.0  GLUCOSEU  NEGATIVE  HGBUR NEGATIVE  BILIRUBINUR MODERATE*  KETONESUR 15*  PROTEINUR 30*  UROBILINOGEN 1.0  NITRITE POSITIVE*  LEUKOCYTESUR MODERATE*   Studies/Results: Mr Brain Wo Contrast  09/03/2013   CLINICAL DATA:  Fall. Dizziness  EXAM: MRI HEAD WITHOUT CONTRAST  TECHNIQUE: Multiplanar, multisequence MR imaging was performed. No intravenous contrast was administered.  COMPARISON:  CT head 04/30/2012  FINDINGS: Incomplete exam. The study was limited to axial T2, FLAIR, and diffusion.  Generalized atrophy. Negative for acute infarct. No significant chronic ischemia. Negative for mass or fluid collection.  IMPRESSION: Generalized atrophy. No acute abnormality.   Electronically Signed   By: Marlan Palau M.D.   On: 09/03/2013 10:04   Medications: I have reviewed the patient's current medications. Scheduled  Meds: . acetaminophen  1,000 mg Oral TID   Or  . acetaminophen  975 mg Rectal TID  . ARIPiprazole  15 mg Oral Daily  . buPROPion  150 mg Oral Daily  . feeding supplement (RESOURCE BREEZE)  1 Container Oral TID BM  . fludrocortisone  0.1 mg Oral Daily  . gabapentin  300 mg Oral TID  . levothyroxine  75 mcg Oral QAC breakfast  . liothyronine  5 mcg Oral Daily  . nitrofurantoin (macrocrystal-monohydrate)  100 mg Oral Q12H  . pantoprazole  40 mg Oral Daily  . simvastatin  40 mg Oral QPC supper  . sodium chloride  3 mL Intravenous Q12H  . traZODone  100 mg Oral QHS   Continuous Infusions: . dextrose 5 % and 0.9% NaCl 75 mL/hr at 09/04/13 1243   PRN Meds:.ondansetron (ZOFRAN) IV  Assessment/Plan: [name] is a 42 y.o. female w/ PMHx of Bipolar Disorder, Hypothyroidism, HLD, GERD, presents to the ED w/ complaints of recurrent falls over the past 1-2 months.  Frequent falls- Patient has a history of frequent falls, she was not orthostatic by vital signs but subjectively she is orthostatic at home. She has been prescribed florinef by her PCP due to suspicion medication induced orthostasis but has not been taking this medication. She does not report LOC or trauma to her head during her fall, she does have some low back pain but no spinous process tenderness. Thought that recent instability is likely d/t Depakote toxicity. Level 84, high normal. Free Depakote level pending at this time. Today, patient is much more alert, feels better.   - MRI brain from yesterday, shows generalized atrophy. No acute abnormality.  - Continue Florinef 0.1 mg daily  - B12 level normal, Thiamine, Vitamin E pending.  Vitamin D wnl. Copper level, 55, which is low, will investigate further on follow up. - Neurology consulted early in admission, feel that Depakote may be at toxic levels as it is high normal. Free Depakote level still pending. Discussed medication management with Psychiatry, feel that the  patient should remain off of Depakote at this time and follow up at [facility] for further management and need for a different mood stabilizer. Also suggested decreasing Trazodone to 100 mg qhs instead of her previous, much higher dose. Lethargy caused by Trazodone may be associated with her instability as well.  - PT recommended SNF placement given her instability but patient refuses. This is our ultimate/final recommendation for the patient's safety and she understands this.  PT saw patient today for final recs and we will seek home health PT, OT, HHRN, etc. for maximal care as patient will not got to SNF.   Hypothyroidism- Continue home medications. Synthroid daily + Liothronine daily. -  TSH shown to be 0.033, just below LLN.  - fT4 wnl, Total T3 69, below LLN. Will discuss on follow up.   HYPERCHOLESTEROLEMIA  - Continue simvastatin   DISORDER, BIPOLAR NOS  - Continue home medications of Abilify, Wellbutrin, Trazodone @ 100 mg qhs    GERD  - Protonix 40mg    UTI (lower urinary tract infection)- Patient still complaining of dysuria and increased frequency, w/ some mild urinary incontinence lately.  - Patient reports completing Keflex which recent culture showed sensitivity of E. Coli and Klebsiella Pneumo were sensitive to. However patient continues to report dysuria and U/A is consistent with a UTI.  - Continue Macrobid po for 4 more days on discharge - Should consider urology referral on an outpatient basis.   Dispo: Disposition is deferred at this time, awaiting improvement of current medical problems.  Anticipated discharge in approximately 1-2 day(s).   The patient does have a current PCP Linward Headland, MD) and does need an [facility] hospital follow-up appointment after discharge.  The patient does not have transportation limitations that hinder transportation to clinic appointments.  .Services Needed at time of discharge: Y = Yes, Blank = No PT: Y  OT: Y  RN: Y  Equipment: Lindi Adie, shower chair  Other:     LOS: 3 days   Courtney Paris, MD 09/04/2013, 3:56 PM Pager: [phone]

## 2013-09-04 NOTE — Progress Notes (Signed)
Physical Therapy Treatment Patient Details Name: [name] MRN: [id number] DOB: [birth year] Today's Date: 09/04/2013 Time: 4098-1191 PT Time Calculation (min): 18 min  PT Assessment / Plan / Recommendation  History of Present Illness 42 y.o. female admitted to [facility] on 09/01/13 with fall in the bathroom at her home.  Pt with hx bipolar disorder, hypothyroidism HL.  She was recently on antibiotics for UTI.  Found to be in depakote toxicity.     PT Comments   Pt is progressing well with therapy, requiring less assistance for gait with RW and not as many balance issues today.  I still think she would be safest with 24/7 care, but pt is refusing SNF placement.  So, please maximize her [facility] services that she can receive.    Follow Up Recommendations  SNF;Supervision/Assistance - 24 hour (pt refusing SNF, so recommend max HH services)     Does the patient have the potential to tolerate intense rehabilitation    NA  Barriers to Discharge   Decreased caregiver support      Equipment Recommendations  None recommended by PT    Recommendations for Other Services   None  Frequency Min 3X/week   Progress towards PT Goals Progress towards PT goals: Progressing toward goals  Plan Current plan remains appropriate    Precautions / Restrictions Precautions Precautions: Fall Precaution Comments: h/o frequent falls at home   Pertinent Vitals/Pain See vitals flow sheet.    Mobility  Bed Mobility Supine to Sit: 6: Modified independent (Device/Increase time);With rails;HOB elevated Sitting - Scoot to Edge of Bed: 6: Modified independent (Device/Increase time);With rail Sit to Supine: 6: Modified independent (Device/Increase time);With rail;HOB elevated Details for Bed Mobility Assistance: pt relying on bed rails to get to EOB.   Transfers Transfers: Sit to Stand;Stand to Sit Sit to Stand: 4: Min guard;With upper extremity assist;With armrests;From bed Stand to Sit: 4: Min guard;With upper  extremity assist;With armrests;To bed Details for Transfer Assistance: Min guard assist provided for safety given her history.  Pt with uncontrolled descent to sit due to pain and fatigue.  Verbal cues given for RW safety.  Ambulation/Gait Ambulation/Gait Assistance: 4: Min assist Ambulation Distance (Feet): 90 Feet Assistive device: Rolling walker Ambulation/Gait Assistance Details: min guard assist for safety as second half of walk pt reporting increase in low back pain and lightheadedness.   Gait Pattern: Step-through pattern;Shuffle Gait velocity: decreased General Gait Details: pt not leaning posteriorly or right today.  She generally looks good with RW and I encouraged her to use the RW she has at home.  Verbal cues for RW safety during gait and while turning.   Stairs: No (pt reports she does not have any steps to enter or on inside)      PT Goals (current goals can now be found in the care plan section) Acute Rehab PT Goals Patient Stated Goal: to be able to care for herself.   Visit Information  Last PT Received On: 09/04/13 Assistance Needed: +1 History of Present Illness: 42 y.o. female admitted to [facility] on 09/01/13 with fall in the bathroom at her home.  Pt with hx bipolar disorder, hypothyroidism HL.  She was recently on antibiotics for UTI.  Found to be in depakote toxicity.      Subjective Data  Subjective: Pt continues to be more alert than the first day we saw her.  Still tearful on and off during session.   Patient Stated Goal: to be able to care for herself.  Cognition  Cognition Arousal/Alertness: Awake/alert Behavior During Therapy: Flat affect Overall Cognitive Status: Within Functional Limits for tasks assessed    Balance  Balance Balance Assessed: Yes Static Sitting Balance Static Sitting - Balance Support: No upper extremity supported;Feet supported Static Sitting - Level of Assistance: 7: Independent Static Standing Balance Static Standing - Balance  Support: Bilateral upper extremity supported Static Standing - Level of Assistance: 5: Stand by assistance Dynamic Standing Balance Dynamic Standing - Balance Support: Bilateral upper extremity supported Dynamic Standing - Level of Assistance: 4: Min assist  End of Session PT - End of Session Equipment Utilized During Treatment: Gait belt Activity Tolerance: Patient limited by fatigue;Patient limited by pain Patient left: in bed;with call bell/phone within reach;with bed alarm set Nurse Communication: Mobility status;Patient requests pain meds    Charbel Los B. Leba Tibbitts, PT, DPT [phone]   09/04/2013, 3:25 PM

## 2013-09-05 ENCOUNTER — Other Ambulatory Visit: Payer: Self-pay | Admitting: *Deleted

## 2013-09-05 LAB — VITAMIN E: Alpha-Tocopherol: 10.5 mg/L (ref 5.7–19.9)

## 2013-09-05 NOTE — ED Provider Notes (Signed)
Medical screening examination/treatment/procedure(s) were performed by non-physician practitioner and as supervising physician I was immediately available for consultation/collaboration.  EKG Interpretation     Ventricular Rate:  69 PR Interval:  144 QRS Duration: 94 QT Interval:  428 QTC Calculation: 458 R Axis:   68 Text Interpretation:  Normal sinus rhythm Incomplete right bundle branch block Nonspecific ST and T wave abnormality Abnormal ECG             Raeford Razor, MD 09/05/13 [phone]

## 2013-09-06 MED ORDER — TRAMADOL HCL 50 MG PO TABS: 50.0000 mg | ORAL_TABLET | Freq: Four times a day (QID) | ORAL | Status: DC | PRN

## 2013-09-06 NOTE — Telephone Encounter (Signed)
Tramadol rx called to [facility] pharmacy. 

## 2013-09-07 LAB — VITAMIN B1: Vitamin B1 (Thiamine): <7 nmol/L — ABNORMAL LOW (ref 8–30)

## 2013-09-08 NOTE — Discharge Summary (Signed)
  Date: 09/08/2013  Patient name: [name]  Medical record number: [id number]  Date of birth: 1971-06-02   This patient has been seen and the plan of care was discussed with the house staff. Please see their note for complete details. I concur with their findings and plan.  Jonah Blue, DO, FACP Faculty [facility] Internal Medicine Residency Program 09/08/2013, 11:17 PM

## 2013-09-11 ENCOUNTER — Ambulatory Visit (INDEPENDENT_AMBULATORY_CARE_PROVIDER_SITE_OTHER): Payer: Medicaid Other | Admitting: Internal Medicine

## 2013-09-11 ENCOUNTER — Encounter: Payer: Self-pay | Admitting: Internal Medicine

## 2013-09-11 VITALS — BP 93/72 | HR 80 | Temp 97.3°F | Ht 65.0 in | Wt 152.8 lb

## 2013-09-11 DIAGNOSIS — M545 Low back pain: Secondary | ICD-10-CM

## 2013-09-11 DIAGNOSIS — F319 Bipolar disorder, unspecified: Secondary | ICD-10-CM

## 2013-09-11 DIAGNOSIS — E039 Hypothyroidism, unspecified: Secondary | ICD-10-CM

## 2013-09-11 DIAGNOSIS — G8929 Other chronic pain: Secondary | ICD-10-CM | POA: Insufficient documentation

## 2013-09-11 DIAGNOSIS — R634 Abnormal weight loss: Secondary | ICD-10-CM

## 2013-09-11 MED ORDER — LEVOTHYROXINE SODIUM 75 MCG PO TABS: 75.0000 ug | ORAL_TABLET | Freq: Every day | ORAL | Status: DC

## 2013-09-11 MED ORDER — CYCLOBENZAPRINE HCL 5 MG PO TABS: 5.0000 mg | ORAL_TABLET | Freq: Three times a day (TID) | ORAL | Status: DC | PRN

## 2013-09-11 NOTE — Assessment & Plan Note (Signed)
Per above, since symptoms have improved, will continue synthroid and liothyronine as prescribed. -recheck TSH at next visit

## 2013-09-11 NOTE — Progress Notes (Signed)
Case discussed with Dr. Brown at the time of the visit.  We reviewed the resident's history and exam and pertinent patient test results.  I agree with the assessment, diagnosis and plan of care documented in the resident's note. 

## 2013-09-11 NOTE — Assessment & Plan Note (Signed)
The patient notes some mild residual low back pain, status post fall prior to her last admission.  She requests Flexeril in addition to her tramadol for the acute pain. We discussed the sedating effects of Flexeril, and discussed taking the first dose in the evening. -Will prescribe a short-term course of Flexeril -Patient to continue tramadol

## 2013-09-11 NOTE — Patient Instructions (Addendum)
General Instructions: I'm very glad that your appetite has improved!  For your back pain, we are prescribing Flexeril.  Take 1 tablet every 8 hours as needed for pain.  Please return for a follow-up visit in 3 months.   Treatment Goals:  Goals (1 Years of Data) as of 09/11/13   None      Progress Toward Treatment Goals:  Treatment Goal 09/11/2013  Stop smoking -  Prevent falls improved    Self Care Goals & Plans:  Self Care Goal 03/27/2013  Manage my medications take my medicines as prescribed; bring my medications to every visit  Monitor my health keep track of my blood pressure  Eat healthy foods eat baked foods instead of fried foods  Be physically active find time in my schedule  Prevent falls wear appropriate shoes    No flowsheet data found.   Care Management & Community Referrals:  Referral 09/11/2013  Referrals made for care management support none needed  Referrals made to community resources -

## 2013-09-11 NOTE — Assessment & Plan Note (Signed)
Depakote has been discontinued.  Clonazepam has been restarted by her psychiatrist. -continue to follow-up with [facility]

## 2013-09-11 NOTE — Progress Notes (Signed)
HPI The patient is a 42 y.o. female with a history of bipolar disorder, hypothyroidism, presenting for a hospital follow-up.  The patient was hospitalized 11/2-11/5, due to her ongoing issues of weight loss, complicated by frequent falls.  During her hospitalization, she was found to have a supratherapeutic Depakote level (likely due to hypoalbuminemia, due to poor by mouth intake), and Depakote was stopped. Since then, the patient has followed up with a [facility], who restarted clonazepam.  The patient states that she feels much better since stopping the Depakote, and that her appetite has increased. She has been eating more solid food, including a progress yesterday, and some beans today.  Weight is stable compared to last visit.  The patient has a history of hypothyroidism. Her last TSH was very, likely due to her profound weight loss affecting her Synthroid requirement. The dose of Synthroid prescribed was inadvertently changed at hospital discharge, but the patient currently notes taking synthroid 75, as well as liothyronine 5 mcg daily.  The patient was found to have a UTI during her hospitalization. Symptoms of dysuria have resolved. The patient has completed Macrobid, but she notes some GI upset with this medication.  The patient notes a fall prior to her last hospitalization. She still has some mild residual low back pain, but without sensory loss, motor loss, or bowel/bladder loss.  ROS: General: no fevers, chills Skin: no rash HEENT: no blurry vision, hearing changes, sore throat Pulm: no dyspnea, coughing, wheezing CV: no chest pain, palpitations, shortness of breath Abd: no abdominal pain, nausea/vomiting, diarrhea/constipation GU: no dysuria, hematuria, polyuria Ext: no arthralgias, myalgias Neuro: no weakness, numbness, or tingling  Filed Vitals:   09/11/13 1423  BP: 93/72  Pulse: 80  Temp: 97.3 F (36.3 C)    PEX General: alert, cooperative, and in no  apparent distress HEENT: pupils equal round and reactive to light, vision grossly intact, oropharynx clear and non-erythematous  Neck: supple Lungs: clear to ascultation bilaterally, normal work of respiration, no wheezes, rales, ronchi Heart: regular rate and rhythm, no murmurs, gallops, or rubs Abdomen: soft, non-tender, non-distended, normal bowel sounds Back: mild tenderness and mild firmness of lumbar paraspinal muscles Extremities: no cyanosis, clubbing, or edema Neurologic: alert & oriented X3, cranial nerves II-XII intact, strength grossly intact, sensation intact to light touch  Current Outpatient Prescriptions on File Prior to Visit  Medication Sig Dispense Refill  . ARIPiprazole (ABILIFY) 15 MG tablet Take 15 mg by mouth daily.      Marland Kitchen buPROPion (WELLBUTRIN XL) 150 MG 24 hr tablet Take 150 mg by mouth daily.      . fludrocortisone (FLORINEF) 0.1 MG tablet Take 1 tablet (0.1 mg total) by mouth daily.  30 tablet  2  . gabapentin (NEURONTIN) 300 MG capsule Take 300 mg by mouth 3 (three) times daily.      Marland Kitchen levothyroxine (SYNTHROID, LEVOTHROID) 88 MCG tablet Take 88 mcg by mouth daily before breakfast.      . liothyronine (CYTOMEL) 5 MCG tablet Take 5 mcg by mouth daily.      . nitrofurantoin, macrocrystal-monohydrate, (MACROBID) 100 MG capsule Take 1 capsule (100 mg total) by mouth every 12 (twelve) hours.  9 capsule  0  . ondansetron (ZOFRAN ODT) 4 MG disintegrating tablet Take 1 tablet (4 mg total) by mouth every 8 (eight) hours as needed for nausea.  60 tablet  2  . pantoprazole (PROTONIX) 40 MG tablet Take 1 tablet (40 mg total) by mouth daily.  30 tablet  11  . simvastatin (ZOCOR) 40 MG tablet Take 1 tablet (40 mg total) by mouth every morning.  30 tablet  11  . traMADol (ULTRAM) 50 MG tablet Take 1 tablet (50 mg total) by mouth every 6 (six) hours as needed.  120 tablet  0  . traZODone (DESYREL) 100 MG tablet Take 1 tablet (100 mg total) by mouth at bedtime.  30 tablet  5  .  triamcinolone cream (KENALOG) 0.1 % Apply topically 2 (two) times daily. To red, itchy areas on neck and arms.  Do not use for more than 2 weeks  45 g  0   No current facility-administered medications on file prior to visit.    Assessment/Plan

## 2013-09-11 NOTE — Assessment & Plan Note (Signed)
The patient notes that her appetite has improved, since stopping Depakote and decreasing her dose of Synthroid. -Patient continues to followup with [facility] for bipolar disorder -Will continue patient on her current dose of Synthroid 75 MCG and liothyronine 5 MCG daily, since symptoms have improved -Will recheck TSH at next visit

## 2013-09-16 NOTE — ED Provider Notes (Signed)
Medical screening examination/treatment/procedure(s) were performed by non-physician practitioner and as supervising physician I was immediately available for consultation/collaboration.  Megan E Docherty, MD 09/16/13 1213 

## 2013-09-17 ENCOUNTER — Telehealth: Payer: Self-pay | Admitting: *Deleted

## 2013-09-17 MED ORDER — HYDROCODONE-ACETAMINOPHEN 5-325 MG PO TABS: 1.0000 | ORAL_TABLET | Freq: Three times a day (TID) | ORAL | Status: DC | PRN

## 2013-09-17 NOTE — Telephone Encounter (Signed)
Pt called stating she was seen in clinic on 11/12 for back pain after a fall. She states flexeril tramadol is not helping with the pain.  She needs something stronger for the pain.  She has had no relief from pain. Rates pain 8/10 constant.  This is more severe than at last visit. Pt # [id number]  I told pt she would need to be seen for reevaluation as pain has increased but she states she can not come back in.  No ride. I offered appointment at end of week and she said no. Please call pt.

## 2013-09-17 NOTE — Telephone Encounter (Signed)
I returned the patient's phone call.  She notes continued pain from her recent fall.  No weakness, loss of bowel/bladder.  Given her transportation issues, I've approved a 1-time prescription for Hydrocodone-acetaminophen, 5-325, TID prn, #30.  If she still has pain after using this, she needs to return for a follow-up visit.  Pt expressed understanding.  Prescription placed in Sample Room in [facility].

## 2013-09-19 ENCOUNTER — Telehealth: Payer: Self-pay | Admitting: *Deleted

## 2013-09-19 NOTE — Telephone Encounter (Signed)
Pt got up from sofa to answer the door for [facility]hn and slipped and fell, states her left side felt numb, unable to move the left side at first but is ok now, no noted injuries. She refuses office visit unless she can see you. Vital signs 104/68, hr 75, resp 18, o2 sat 98%. HHN ask for social worker visit and was given verbal ok. She ask that you call her this evening

## 2013-09-20 NOTE — Telephone Encounter (Signed)
Thank you for this note.  These symptoms are concerning.  I believe the patient should seek care, either in our clinic or in the ED.  I'm happy to see her if I have any availability, but if not, she should be seen by another provider.

## 2013-09-22 ENCOUNTER — Telehealth: Payer: Self-pay | Admitting: Internal Medicine

## 2013-09-22 NOTE — Telephone Encounter (Signed)
   Reason for call:   I received a call from Ms. Liston Alba at 6:00  PM indicating that she was having worsening LE shooting pain that was not being controlled by vicodin, flexeril, or anxiolytics. She reported recent fall on Sunday that worsened the pain and intolerance to her symptoms. She denied any fever/chills, n/v/d, urinary/stool incontinence, dysuria/pyruia, or saddle paraesthesia.   Pertinent Data:   Review of clinic and recent hospitalization dischafe along with meds/problem list was discussed with patient    Assessment / Plan / Recommendations:   Pt was advised to seek medical evaluation in ED given new and worsening symptoms and inability to achieve pain control  PCP was made aware   Christen Bame, MD   09/22/2013, 6:56 PM

## 2013-09-23 NOTE — Telephone Encounter (Signed)
She refuses to see anyone but you

## 2013-09-25 ENCOUNTER — Other Ambulatory Visit: Payer: Self-pay | Admitting: Orthopaedic Surgery

## 2013-09-25 ENCOUNTER — Telehealth: Payer: Self-pay | Admitting: *Deleted

## 2013-09-25 ENCOUNTER — Other Ambulatory Visit (HOSPITAL_COMMUNITY): Payer: Self-pay | Admitting: Orthopaedic Surgery

## 2013-09-25 ENCOUNTER — Other Ambulatory Visit: Payer: Self-pay | Admitting: Internal Medicine

## 2013-09-25 ENCOUNTER — Ambulatory Visit (HOSPITAL_COMMUNITY)
Admission: RE | Admit: 2013-09-25 | Discharge: 2013-09-25 | Disposition: A | Discharge status: Home / Self Care | Payer: Medicaid Other | Source: Ambulatory Visit | Attending: Orthopaedic Surgery | Admitting: Orthopaedic Surgery

## 2013-09-25 DIAGNOSIS — M79609 Pain in unspecified limb: Secondary | ICD-10-CM

## 2013-09-25 DIAGNOSIS — M25561 Pain in right knee: Secondary | ICD-10-CM

## 2013-09-25 DIAGNOSIS — M7989 Other specified soft tissue disorders: Secondary | ICD-10-CM | POA: Insufficient documentation

## 2013-09-25 DIAGNOSIS — M545 Low back pain: Secondary | ICD-10-CM

## 2013-09-25 DIAGNOSIS — R609 Edema, unspecified: Secondary | ICD-10-CM

## 2013-09-25 MED ORDER — VITAMIN B-1 100 MG PO TABS: 100.0000 mg | ORAL_TABLET | Freq: Every day | ORAL | Status: DC

## 2013-09-25 NOTE — Telephone Encounter (Signed)
Katlin with [facility] [phone]  called clinic to extend home visit for 2-3 more times due to right leg swelling. Doppler was neg. OK extexsion for  2-3 more home visit and Maryellen Pile is aware. Stanton Kidney Federica Allport RN 09/25/13 3:40PM

## 2013-09-25 NOTE — Progress Notes (Signed)
*  Preliminary Results* Right lower extremity venous duplex completed. Right lower extremity is negative for deep vein thrombosis. There is no evidence of right Baker's cyst.  Preliminary results discussed with Thereasa Distance of Dr.Whitfield's office.  09/25/2013 2:24 PM  Gertie Fey, RVT, RDCS, RDMS

## 2013-09-30 ENCOUNTER — Other Ambulatory Visit: Payer: Self-pay

## 2013-09-30 NOTE — Telephone Encounter (Signed)
I agree with this order.  Thank you. 

## 2013-10-02 ENCOUNTER — Other Ambulatory Visit: Payer: Self-pay | Admitting: *Deleted

## 2013-10-02 NOTE — Telephone Encounter (Signed)
I do not believe this prescription should be filled at this time.  If patient continues to have pain, she needs to be re-evaluated in our clinic.  Thank you for your help with this issue.

## 2013-10-02 NOTE — Telephone Encounter (Signed)
Call from [facility] with [facility] - # [phone] Pt is out of vicodin, she had a fall 2 weeks ago and having pain. Seen by Dr Cleophas Dunker.

## 2013-10-03 ENCOUNTER — Other Ambulatory Visit: Payer: Self-pay

## 2013-10-03 NOTE — Telephone Encounter (Signed)
HHN informed and will call pt.

## 2013-10-03 NOTE — Telephone Encounter (Signed)
HHN called and message left. She is to return call to clinic

## 2013-10-07 ENCOUNTER — Other Ambulatory Visit: Payer: Self-pay | Admitting: Internal Medicine

## 2013-10-08 MED ORDER — TRAMADOL HCL 50 MG PO TABS: 50.0000 mg | ORAL_TABLET | Freq: Four times a day (QID) | ORAL | Status: DC | PRN

## 2013-10-08 NOTE — Telephone Encounter (Signed)
Please phone in

## 2013-10-08 NOTE — Telephone Encounter (Signed)
Tramadol rx called to [facility]. 

## 2013-10-10 ENCOUNTER — Ambulatory Visit
Admission: RE | Admit: 2013-10-10 | Discharge: 2013-10-10 | Disposition: A | Discharge status: Home / Self Care | Payer: Medicaid Other | Source: Ambulatory Visit | Attending: Orthopaedic Surgery | Admitting: Orthopaedic Surgery

## 2013-10-10 DIAGNOSIS — M545 Low back pain: Secondary | ICD-10-CM

## 2013-10-17 ENCOUNTER — Encounter (HOSPITAL_COMMUNITY): Payer: Self-pay | Admitting: Pharmacy Technician

## 2013-10-17 ENCOUNTER — Other Ambulatory Visit (HOSPITAL_COMMUNITY): Payer: Self-pay | Admitting: Interventional Radiology

## 2013-10-17 DIAGNOSIS — IMO0002 Reserved for concepts with insufficient information to code with codable children: Secondary | ICD-10-CM

## 2013-10-17 DIAGNOSIS — M549 Dorsalgia, unspecified: Secondary | ICD-10-CM

## 2013-10-18 ENCOUNTER — Other Ambulatory Visit (HOSPITAL_COMMUNITY): Payer: Self-pay | Admitting: Radiology

## 2013-10-22 ENCOUNTER — Encounter (HOSPITAL_COMMUNITY): Payer: Self-pay

## 2013-10-22 ENCOUNTER — Ambulatory Visit (HOSPITAL_COMMUNITY)
Admission: RE | Admit: 2013-10-22 | Discharge: 2013-10-22 | Disposition: A | Discharge status: Home / Self Care | Payer: Medicaid Other | Source: Ambulatory Visit | Attending: Interventional Radiology | Admitting: Interventional Radiology

## 2013-10-22 DIAGNOSIS — Z87891 Personal history of nicotine dependence: Secondary | ICD-10-CM | POA: Insufficient documentation

## 2013-10-22 DIAGNOSIS — S32009A Unspecified fracture of unspecified lumbar vertebra, initial encounter for closed fracture: Secondary | ICD-10-CM | POA: Insufficient documentation

## 2013-10-22 DIAGNOSIS — F319 Bipolar disorder, unspecified: Secondary | ICD-10-CM | POA: Insufficient documentation

## 2013-10-22 DIAGNOSIS — M549 Dorsalgia, unspecified: Secondary | ICD-10-CM

## 2013-10-22 DIAGNOSIS — E039 Hypothyroidism, unspecified: Secondary | ICD-10-CM | POA: Insufficient documentation

## 2013-10-22 DIAGNOSIS — W19XXXA Unspecified fall, initial encounter: Secondary | ICD-10-CM | POA: Insufficient documentation

## 2013-10-22 DIAGNOSIS — IMO0002 Reserved for concepts with insufficient information to code with codable children: Secondary | ICD-10-CM

## 2013-10-22 DIAGNOSIS — Z86718 Personal history of other venous thrombosis and embolism: Secondary | ICD-10-CM | POA: Insufficient documentation

## 2013-10-22 LAB — CBC
Hemoglobin: 12.9 g/dL (ref 12.0–15.0)
MCHC: 35.3 g/dL (ref 30.0–36.0)
MCV: 90.3 fL (ref 78.0–100.0)
Platelets: 178 10*3/uL (ref 150–400)
RBC: 4.04 MIL/uL (ref 3.87–5.11)
WBC: 6.3 10*3/uL (ref 4.0–10.5)

## 2013-10-22 LAB — BASIC METABOLIC PANEL
Calcium: 9.6 mg/dL (ref 8.4–10.5)
GFR calc non Af Amer: 69 mL/min — ABNORMAL LOW (ref >90)
Sodium: 143 mEq/L (ref 135–145)

## 2013-10-22 LAB — PROTIME-INR: Prothrombin Time: 13.4 seconds (ref 11.6–15.2)

## 2013-10-22 LAB — APTT: aPTT: 29 seconds (ref 24–37)

## 2013-10-22 MED ORDER — CEFAZOLIN SODIUM-DEXTROSE 2-3 GM-% IV SOLR
2.0000 g | Freq: Once | INTRAVENOUS | Status: AC
  Administered 2013-10-22: 2 g via INTRAVENOUS
  Filled 2013-10-22: qty 50

## 2013-10-22 MED ORDER — SODIUM CHLORIDE 0.9 % IV SOLN: INTRAVENOUS | Status: AC

## 2013-10-22 MED ORDER — MIDAZOLAM HCL 2 MG/2ML IJ SOLN
INTRAMUSCULAR | Status: AC
  Filled 2013-10-22: qty 4

## 2013-10-22 MED ORDER — HYDROMORPHONE HCL PF 1 MG/ML IJ SOLN
INTRAMUSCULAR | Status: AC
  Filled 2013-10-22: qty 1

## 2013-10-22 MED ORDER — HYDROMORPHONE HCL PF 1 MG/ML IJ SOLN
INTRAMUSCULAR | Status: DC | PRN
  Administered 2013-10-22: 1 mg

## 2013-10-22 MED ORDER — MIDAZOLAM HCL 2 MG/2ML IJ SOLN
INTRAMUSCULAR | Status: DC | PRN
  Administered 2013-10-22 (×3): 1 mg via INTRAVENOUS

## 2013-10-22 MED ORDER — OXYCODONE-ACETAMINOPHEN 5-325 MG PO TABS
2.0000 | ORAL_TABLET | Freq: Once | ORAL | Status: AC
  Administered 2013-10-22: 2 via ORAL
  Filled 2013-10-22: qty 2

## 2013-10-22 MED ORDER — ONDANSETRON HCL 4 MG/2ML IJ SOLN
INTRAMUSCULAR | Status: AC
  Filled 2013-10-22: qty 2

## 2013-10-22 MED ORDER — TOBRAMYCIN SULFATE 1.2 G IJ SOLR
INTRAMUSCULAR | Status: AC
  Filled 2013-10-22: qty 1.2

## 2013-10-22 MED ORDER — FENTANYL CITRATE 0.05 MG/ML IJ SOLN
INTRAMUSCULAR | Status: DC | PRN
  Administered 2013-10-22 (×3): 25 ug via INTRAVENOUS

## 2013-10-22 MED ORDER — SODIUM CHLORIDE 0.9 % IV SOLN: Freq: Once | INTRAVENOUS | Status: DC

## 2013-10-22 MED ORDER — DIPHENHYDRAMINE HCL 50 MG PO CAPS
50.0000 mg | ORAL_CAPSULE | Freq: Once | ORAL | Status: AC
  Administered 2013-10-22: 50 mg via ORAL
  Filled 2013-10-22: qty 2
  Filled 2013-10-22: qty 1

## 2013-10-22 MED ORDER — FENTANYL CITRATE 0.05 MG/ML IJ SOLN
INTRAMUSCULAR | Status: AC
  Filled 2013-10-22: qty 4

## 2013-10-22 MED ORDER — ONDANSETRON HCL 4 MG/2ML IJ SOLN
4.0000 mg | Freq: Once | INTRAMUSCULAR | Status: AC
  Administered 2013-10-22: 4 mg via INTRAVENOUS

## 2013-10-22 NOTE — Progress Notes (Signed)
KEVIN BRUNING,PA NOTIFIED OF C/O PAIN AND PER KEVIN WILL GIVE CLIENT WARM COMPRESS FOR BACK

## 2013-10-22 NOTE — ED Notes (Signed)
Pt in nurses station, awaiting room readiness.  Questions answered, reassurance given about procedure and events.  Pt verbalizes anxiety.

## 2013-10-22 NOTE — ED Notes (Signed)
O2 d/c'd 

## 2013-10-22 NOTE — Progress Notes (Signed)
NO C/O PAIN

## 2013-10-22 NOTE — Procedures (Signed)
S/P L1KP with biopsy 

## 2013-10-22 NOTE — H&P (Signed)
Chief Complaint: "I'm here to have my back fracture fixed"  HPI: [name] is an 42 y.o. female who fell on her back about 6-8 weeks ago and has had ongoing back pain. She had an MRI on 12/12 which found a compression fracture at the L1 level. She reports continued back pain daily, having to take tramadol. She denies LE weakness, tingling, bowel or bladder dysfunction. She denies recent illness or fevers. She is referred for consideration of vertebral augmentation. After review of her imaging, she is scheduled for kyphoplasty of L1 level. PMHx and current meds reviewed.  Past Medical History:  Past Medical History  Diagnosis Date  . Deep venous thrombosis of leg 2012    completed year of coumadin   . Hx of measles   . Migraines   . Monilia infection 12/31/10  . Vaginal atrophy 03/04/11  . Dyspareunia 03/04/11  . Bipolar 1 disorder     Followed by Mental Health.  All psychiatric medications prescribed by Mental Health.  Stable for many years.    . Hypothyroidism     Hypothyroidism since 8th grade.  Managed by Dr. Sharl Ma, Endocrinology.  On synthroid.  No prior thyroid surgery/ablation.     Past Surgical History:  Past Surgical History  Procedure Laterality Date  . 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 Cesarean section  1998  . Tonsillectomy    . Wisdom tooth extraction    . Cholecystectomy    . Left elbow    . Abdominal hysterectomy  2000  . Appendectomy      Family History:  Family History  Problem Relation Age of Onset  . Cancer Maternal Aunt     Social History:  reports that she quit smoking about a year ago. Her smoking use included Cigarettes. She has a 12.5 pack-year smoking history. She has never used smokeless tobacco. She reports that she does not drink alcohol or use illicit drugs.  Allergies:  Allergies  Allergen Reactions  . Codeine     Nausea and vomiting  . Meloxicam Nausea Only  . Morphine And Related     Makes me loopy and hallucinates  .  Naproxen     Rash and nausea  . Sulfonamide Derivatives     Nausea and rash    Medications:   Medication List    ASK your doctor about these medications       ARIPiprazole 15 MG tablet  Commonly known as:  ABILIFY  Take 15 mg by mouth daily.     buPROPion 150 MG 24 hr tablet  Commonly known as:  WELLBUTRIN XL  Take 150 mg by mouth daily.     ciprofloxacin-dexamethasone otic suspension  Commonly known as:  CIPRODEX  Place 4 drops into both ears daily as needed (ear pain).     clonazePAM 1 MG tablet  Commonly known as:  KLONOPIN  Take 1 mg by mouth 2 (two) times daily.     cyclobenzaprine 5 MG tablet  Commonly known as:  FLEXERIL  Take 1 tablet (5 mg total) by mouth every 8 (eight) hours as needed for muscle spasms.     fludrocortisone 0.1 MG tablet  Commonly known as:  FLORINEF  Take 1 tablet (0.1 mg total) by mouth daily.     gabapentin 300 MG capsule  Commonly known as:  NEURONTIN  Take 300 mg by mouth 3 (three) times daily.     levothyroxine 75 MCG tablet  Commonly known as:  SYNTHROID, LEVOTHROID  Take 1 tablet (75 mcg total) by mouth daily before breakfast.     ondansetron 4 MG disintegrating tablet  Commonly known as:  ZOFRAN-ODT  Take 4 mg by mouth daily as needed for nausea or vomiting.     pantoprazole 40 MG tablet  Commonly known as:  PROTONIX  Take 1 tablet (40 mg total) by mouth daily.     simvastatin 40 MG tablet  Commonly known as:  ZOCOR  Take 1 tablet (40 mg total) by mouth every morning.     traMADol 50 MG tablet  Commonly known as:  ULTRAM  Take 50 mg by mouth every 4 (four) hours as needed (pain).     traZODone 100 MG tablet  Commonly known as:  DESYREL  Take 1 tablet (100 mg total) by mouth at bedtime.        Please HPI for pertinent positives, otherwise complete 10 system ROS negative.  Physical Exam: BP 138/83  Pulse 79  Temp(Src) 97.7 F (36.5 C) (Oral)  Resp 18  Ht 5\' 5"  (1.651 m)  Wt 162 lb (73.483 kg)  BMI 26.96  kg/m2  SpO2 100% Body mass index is 26.96 kg/(m^2).   General Appearance:  Alert, cooperative, no distress, appears stated age  Head:  Normocephalic, without obvious abnormality, atraumatic  ENT: Unremarkable  Neck: Supple, symmetrical, trachea midline  Lungs:   Clear to auscultation bilaterally, no w/r/r.  Chest Wall:  No tenderness or deformity  Heart:  Regular rate and rhythm, S1, S2 normal, no murmur, rub or gallop.  Abdomen:   Soft, non-tender, non distended.  Neurologic: Normal affect, no gross deficits.   No results found for this or any previous visit (from the past 48 hour(s)). No results found.  Assessment/Plan Painful L1 compression fracture. For L1 KP Discussed procedure, risks, complications, use of sedation. Labs pending Consent signed in chart  Brayton El PA-C 10/22/2013, 8:12 AM

## 2013-10-22 NOTE — Progress Notes (Signed)
C/O ITCHING AND KEVIN BRUNING,PA NOTIFIED AND ORDER NOTED AND MED GIVEN

## 2013-10-22 NOTE — ED Notes (Signed)
MD at bedside.  Procedure reviewed with pt and mother.

## 2013-10-22 NOTE — ED Notes (Signed)
o2 2l [place] placed

## 2013-10-28 ENCOUNTER — Other Ambulatory Visit (HOSPITAL_COMMUNITY): Payer: Self-pay | Admitting: Interventional Radiology

## 2013-10-28 DIAGNOSIS — M549 Dorsalgia, unspecified: Secondary | ICD-10-CM

## 2013-10-28 DIAGNOSIS — IMO0002 Reserved for concepts with insufficient information to code with codable children: Secondary | ICD-10-CM

## 2013-10-29 ENCOUNTER — Other Ambulatory Visit: Payer: Self-pay | Admitting: *Deleted

## 2013-10-29 MED ORDER — TRAMADOL HCL 50 MG PO TABS: 50.0000 mg | ORAL_TABLET | ORAL | Status: DC | PRN

## 2013-10-29 NOTE — Telephone Encounter (Signed)
Tramadol rx called to [facility]. 

## 2013-11-05 ENCOUNTER — Ambulatory Visit (HOSPITAL_COMMUNITY)
Admission: RE | Admit: 2013-11-05 | Discharge: 2013-11-05 | Disposition: A | Discharge status: Home / Self Care | Payer: Medicaid Other | Source: Ambulatory Visit | Attending: Interventional Radiology | Admitting: Interventional Radiology

## 2013-11-05 DIAGNOSIS — IMO0002 Reserved for concepts with insufficient information to code with codable children: Secondary | ICD-10-CM

## 2013-11-05 DIAGNOSIS — M549 Dorsalgia, unspecified: Secondary | ICD-10-CM

## 2013-11-07 ENCOUNTER — Telehealth: Payer: Self-pay | Admitting: *Deleted

## 2013-11-07 NOTE — Telephone Encounter (Signed)
The patient does not have a history of DM.  Per chart review, I cannot find a reason for her to need test strips.  We will not refill test strips at this time

## 2013-11-07 NOTE — Telephone Encounter (Signed)
Refill request for Accu-Chek Aviva Plus test strips - these are not on pt's med list.  I looked in pt history and a note states d/c by provider. Sometimes the ED will d/c meds a pt is taking so wanted to clarify with you.  I am unable to reach the patient. DM is not listed in problem list. Does she need test strips??

## 2013-11-07 NOTE — Telephone Encounter (Signed)
Pharmacy informed.

## 2013-11-09 ENCOUNTER — Telehealth: Payer: Self-pay | Admitting: Internal Medicine

## 2013-11-09 NOTE — Telephone Encounter (Signed)
   Reason for call:   I received a call from [name] this afternoon indicating that she does not have glucose strips as they were refused to be refilled by pcp.  She says she gets hypoglycemic and needs to check her blood sugar   Pertinent Data:   Refill request for test strips on 11/07/13, refused by PCP Dr. Manson PasseyBrown who said patient does not have hx of DM and cannot find reason for test strips, will not refill.   Pharmacy was informed  Last glucose 70 on 10/22/13   Assessment / Plan / Recommendations:   [name] is a 43 year old female with PMH bipolar disorder, OA, and hx of DVT calling for refills of glucose test strips. She is emotional on the phone, says she gets hypoglycemic and needs to check her sugars. She says she was feeling dizzy today.   I informed her that glucose strips can be purchased from pharmacy and do not need prescriptions, but she says she cannot afford that and they are cheaper with insurance.   I asked her to drink orange juice in the meantime, which she said she already did and feels a little better but she just really needs her strips  I asked to call her back after reviewing the chart. Upon chart review, the refill request was refused by PCP who notes no diagnosis of DM.   I called [name] back and let her know that we will not be refilling it at this time, but that if she is feeling dizzy or unwell, she should go to ED or urgent care for further evaluation, especially due to dizziness. She can also go to fire department to get her blood glucose checked. She was upset and said thank you and hung up the phone as soon as I said I cannot refill.  As always, pt is advised that if symptoms worsen or new symptoms arise, they should go to an urgent care facility or to to ER for further evaluation.  I will forward this note to pcp   Darden PalmerSamaya Chrishun Scheer, MD   11/09/2013, 7:04 PM

## 2013-11-11 ENCOUNTER — Telehealth: Payer: Self-pay | Admitting: *Deleted

## 2013-11-11 MED ORDER — GLUCOSE BLOOD VI STRP: ORAL_STRIP | Status: DC

## 2013-11-11 NOTE — Telephone Encounter (Signed)
Thank you for addressing this issue after hours.  In my prior visits with this patient, I do not believe she has discussed her problem of hypoglycemia with me in detail.  Based on the symptoms described in this phone encounter, and her history of poorly controlled bipolar disorder, I suspect that anxiety may be accounting for at least a portion of the patient's symptoms of "dizziness".  I have a follow-up appointment with the patient in 3 months.  In light of the information provided in this phone encounter, I will refill these glucose test strips for the next 3 months, so that at our visit glucometer data can be analyzed to determine the future need for blood glucose testing, and to determine whether further work-up of hypoglycemia is warranted.  Janalyn Harderyan Michail Boyte
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
## 2013-11-11 NOTE — Addendum Note (Signed)
Addended by: Linward HeadlandBROWN, Bob Daversa K on: 11/11/2013 05:03 AM   Modules accepted: Orders

## 2013-11-12 ENCOUNTER — Other Ambulatory Visit: Payer: Self-pay | Admitting: Internal Medicine

## 2013-11-12 MED ORDER — GABAPENTIN 300 MG PO CAPS: 300.0000 mg | ORAL_CAPSULE | Freq: Three times a day (TID) | ORAL | Status: DC

## 2013-11-13 ENCOUNTER — Telehealth: Payer: Self-pay | Admitting: *Deleted

## 2013-11-13 NOTE — Telephone Encounter (Signed)
[facility] [place] for tablet form. Please change Gabapentin cap to tabs.  Thanks

## 2013-11-13 NOTE — Telephone Encounter (Signed)
Thank you for this note.  In Epic, there is no Gabapentin 300 mg tab, the smallest tab is 600 mg, while capsules come in 300 mg form.  I called the patient's [facility] pharmacy to clarify the issue, and they stated that in fact the original order for gabapentin caps did go through with the patient's insurance, is ready for pickup, and is $3 (typical Medicaid copay).  Please let me know if any further issues come up.

## 2013-11-27 ENCOUNTER — Telehealth: Payer: Self-pay | Admitting: *Deleted

## 2013-11-28 ENCOUNTER — Ambulatory Visit: Payer: Medicaid Other | Admitting: Internal Medicine

## 2013-11-28 NOTE — Telephone Encounter (Signed)
appt made

## 2013-12-05 ENCOUNTER — Other Ambulatory Visit: Payer: Self-pay | Admitting: *Deleted

## 2013-12-06 MED ORDER — TRAMADOL HCL 50 MG PO TABS: 50.0000 mg | ORAL_TABLET | ORAL | Status: DC | PRN

## 2013-12-06 NOTE — Telephone Encounter (Signed)
Tramadol rx called to [facility]. 

## 2013-12-18 ENCOUNTER — Other Ambulatory Visit: Payer: Self-pay | Admitting: Internal Medicine

## 2013-12-23 ENCOUNTER — Other Ambulatory Visit: Payer: Self-pay

## 2013-12-23 ENCOUNTER — Other Ambulatory Visit: Payer: Self-pay | Admitting: Internal Medicine

## 2013-12-23 DIAGNOSIS — Z1231 Encounter for screening mammogram for malignant neoplasm of breast: Secondary | ICD-10-CM

## 2013-12-26 ENCOUNTER — Other Ambulatory Visit: Payer: Self-pay | Admitting: Internal Medicine

## 2013-12-27 ENCOUNTER — Telehealth: Payer: Self-pay | Admitting: Internal Medicine

## 2013-12-27 NOTE — Telephone Encounter (Signed)
   Reason for call:   I received a call from [name] at 5:30  PM indicating that she   Pertinent Data:    She states that she is having pain in her right calf at the site of her previous DVT and feels that she needs a refill of her Flexeril and Tramadol. Of note, the Flexeril was just refilled on 12/19/13 #30, and the Tramadol was filled on 12/05/13 with #60. When I brought this to her attention, she stated that someone else must be taking her pain medication.   Assessment / Plan / Recommendations:   I told her I would discuss this with her PCP.  As always, pt is advised that if symptoms worsen or new symptoms arise, they should go to an urgent care facility or to to ER for further evaluation.   Wendy GatherKathryn F Mikeal Winstanley, MD   12/27/2013, 5:42 PM

## 2013-12-27 NOTE — Telephone Encounter (Signed)
Addendum 12/27/13, 7:45 PM.  I returned the patient's phone call from earlier today, to discuss her pain medications.  The patient did not pick up the phone, so a message was left for the patient.  I also independently called the patient's [facility] to confirm the flexeril and Tramadol prescriptions.  As mentioned above, flexeril was filled, #30 tabs, 2/19, picked up by the patient 2/20.  The patient's last tramadol prescription was indeed filled 2/5, #60 tabs, but the refill on that prescription was actually filled and picked up 2/16, for an additional #60 tabs.  The patient's current usage appears to be about 3-4 flexeril/day, and 5-6 tramadol per day.  While this doesn't exceed the maximum daily dose, it's higher than I would expect, as the patient only has occasional osteoarthritis pain, not daily chronic pain (to the best of my knowledge).  I hope to reach the patient by phone to discuss this further, but my suspicion is that the patient needs a follow-up appointment in clinic to address pain needs.  SignedJanalyn Harder, Pate Aylward, PGY3 12/27/2013, 7:50 PM

## 2013-12-28 ENCOUNTER — Other Ambulatory Visit: Payer: Self-pay | Admitting: Internal Medicine

## 2013-12-28 MED ORDER — TRAMADOL HCL 50 MG PO TABS: 50.0000 mg | ORAL_TABLET | ORAL | Status: DC | PRN

## 2013-12-28 MED ORDER — CYCLOBENZAPRINE HCL 5 MG PO TABS: ORAL_TABLET | ORAL | Status: DC

## 2013-12-28 NOTE — Telephone Encounter (Signed)
   Reason for call:   I received a call from [name] at 700  AM indicating that she had tried to call Dr. Manson Chase to discuss refilling her Tramadol and flexeril. The patient states that she can't drive and therefore has had some trouble coming into the clinic. Pt was very tearful on the phone this AM describing pain and how she had missed Dr. Theora Chase's appointments. Pt is not having any visual loss, vertigo, nausea or vomiting, and denied any numbness/tingling or muscle weakness in her UE bilaterally. She also denied any fever/chills or sick contacts.    Pertinent Data:   Chart review of phone notes and Dr. Theora Chase's message from 12/27/13 indicating that he would like to see patient make a clinic appt for evaluation   Assessment / Plan / Recommendations:   Pt does not have any warning symptoms of neurological compromise at this time and therefore was informed that a short prescription of flexeril 10 pills and tramadol of 10 pills would be made to cover her for the weekend until she would be seen on Monday, a note was sent to the front desk to schedule this appt, and pt was informed that this note would be forwarded to her PCP Dr. Manson Chase for his knowledge and review. Pt agreed and would like to stress that Dr. Manson Chase know that she is "not drug seeking."  As always, pt is advised that if symptoms worsen or new symptoms arise, they should go to an urgent care facility or to to ER for further evaluation.   Christen BameNora Gesselle Fitzsimons, MD   12/28/2013, 7:25 AM

## 2013-12-30 ENCOUNTER — Ambulatory Visit (INDEPENDENT_AMBULATORY_CARE_PROVIDER_SITE_OTHER): Payer: Medicaid Other | Admitting: Internal Medicine

## 2013-12-30 ENCOUNTER — Encounter: Payer: Self-pay | Admitting: Internal Medicine

## 2013-12-30 VITALS — BP 144/86 | HR 70 | Temp 97.0°F | Ht 65.0 in | Wt 200.1 lb

## 2013-12-30 DIAGNOSIS — M62838 Other muscle spasm: Secondary | ICD-10-CM

## 2013-12-30 DIAGNOSIS — Z86718 Personal history of other venous thrombosis and embolism: Secondary | ICD-10-CM

## 2013-12-30 MED ORDER — CYCLOBENZAPRINE HCL 5 MG PO TABS: ORAL_TABLET | ORAL | Status: DC

## 2013-12-30 MED ORDER — TRAMADOL HCL 50 MG PO TABS: 50.0000 mg | ORAL_TABLET | ORAL | Status: DC | PRN

## 2013-12-30 NOTE — Progress Notes (Signed)
[facility]   Subjective:   Patient ID: [name] female   DOB: 07/13/1971 43 y.o.   MRN: [id number]  HPI: [name] is a 43 y.o. 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  She presents today to the clinic for an acute visit.  She reports that this past Friday she initially developed some pain in her right calf that felt like the pain she had years before with her DVT.  She started taking her flexeril and tramadol medications and has had near resolution of that pain.  However yesterday she developed a severe "crink" in her neck that has impaired her movement of her neck and has made daily activities very difficult.  She reports that she has been under a lot of stress lately as four members of her family have been diagnosed with "stage 4 cancer." She does report that she has tried some heating pads to her neck which have given some mild relief but she has run out of flexeril and tramadol which she thinks will help.  She denies any SOB, or palpitations, she has had no recent immobilization, surgery, car or air travel.  She has had no falls or trauma to her neck.  Past Medical History  Diagnosis Date  . Deep venous thrombosis of leg 2012    completed year of coumadin   . Hx of measles   . Migraines   . Monilia infection 12/31/10  . Vaginal atrophy 03/04/11  . Dyspareunia 03/04/11  . Bipolar 1 disorder     Followed by Mental Health.  All psychiatric medications prescribed by Mental Health.  Stable for many years.    . Hypothyroidism     Hypothyroidism since 8th grade.  Managed by Dr. Sharl MaKerr, Endocrinology.  On synthroid.  No prior thyroid surgery/ablation.    Current Outpatient Prescriptions  Medication Sig Dispense Refill  . ARIPiprazole (ABILIFY) 15 MG tablet Take 15 mg by mouth daily.      Marland Kitchen. buPROPion (WELLBUTRIN XL) 150 MG 24 hr tablet Take 150 mg by mouth daily.      .  ciprofloxacin-dexamethasone (CIPRODEX) otic suspension Place 4 drops into both ears daily as needed (ear pain).      . clonazePAM (KLONOPIN) 1 MG tablet Take 1 mg by mouth 2 (two) times daily.      . cyclobenzaprine (FLEXERIL) 5 MG tablet TAKE ONE TABLET BY MOUTH EVERY 8 HOURS AS NEEDED FOR MUSCLE SPASMS  60 tablet  0  . fludrocortisone (FLORINEF) 0.1 MG tablet TAKE 1 TABLET BY MOUTH DAILY  30 tablet  5  . gabapentin (NEURONTIN) 300 MG capsule Take 1 capsule (300 mg total) by mouth 3 (three) times daily.  90 capsule  5  . glucose blood (ACCU-CHEK AVIVA PLUS) test strip Use twice per day as needed to check blood sugar.  Bring glucometer to next physician visit.  100 each  2  . levothyroxine (SYNTHROID, LEVOTHROID) 75 MCG tablet Take 1 tablet (75 mcg total) by mouth daily before breakfast.      . ondansetron (ZOFRAN-ODT) 4 MG disintegrating tablet Take 4 mg by mouth daily as needed for nausea or vomiting.      . pantoprazole (PROTONIX) 40 MG tablet Take 1 tablet (40 mg total) by mouth daily.  30 tablet  11  . simvastatin (ZOCOR) 40 MG tablet Take 1 tablet (40 mg total) by mouth every morning.  30 tablet  11  .  traMADol (ULTRAM) 50 MG tablet Take 1 tablet (50 mg total) by mouth every 4 (four) hours as needed (pain).  60 tablet  0  . traZODone (DESYREL) 100 MG tablet Take 1 tablet (100 mg total) by mouth at bedtime.  30 tablet  5   No current facility-administered medications for this visit.   Family History  Problem Relation Age of Onset  . Cancer Maternal Aunt    History   Social History  . Marital Status: Legally Separated    Spouse Name: N/A    Number of Children: N/A  . Years of Education: N/A   Social History Main Topics  . Smoking status: Former Smoker -- 0.50 packs/day for 25 years    Types: Cigarettes    Quit date: 10/22/2012  . Smokeless tobacco: Never Used  . Alcohol Use: No  . Drug Use: No  . Sexual Activity: Yes    Birth Control/ Protection: Surgical     Comment:  hysterectomy   Other Topics Concern  . None   Social History Narrative  . None   Review of Systems: Review of Systems  Constitutional: Negative for diaphoresis.  Respiratory: Negative for cough, hemoptysis, sputum production and shortness of breath.   Cardiovascular: Negative for chest pain, palpitations and leg swelling.  Musculoskeletal: Positive for joint pain (right knee) and neck pain. Negative for back pain, falls and myalgias.    Objective:  Physical Exam: Filed Vitals:   12/30/13 1616  BP: 144/86  Pulse: 70  Temp: 97 F (36.1 C)  TempSrc: Oral  Height: 5\' 5"  (1.651 m)  Weight: 200 lb 1.6 oz (90.765 kg)  SpO2: 97%  Physical Exam  Nursing note and vitals reviewed. Constitutional: She appears distressed (tearful).  HENT:  Head: Normocephalic.  Cardiovascular: Normal rate, regular rhythm and normal heart sounds.   Pulmonary/Chest: Effort normal and breath sounds normal. No respiratory distress. She has no wheezes.  Musculoskeletal:       Cervical back: She exhibits decreased range of motion (rotation limited to 20 degrees bilaterally), tenderness (right paraspinals C3-C4) and spasm (right paraspinals).  No right lower extremity swelling, no right calf tenderness, mild tenderness in right popliteal fossa.  Skin: Skin is warm and dry.    Assessment & Plan:   See Problem Based Assessment and Plan Meds ordered this encounter  Medications  . traMADol (ULTRAM) 50 MG tablet    Sig: Take 1 tablet (50 mg total) by mouth every 4 (four) hours as needed (pain).    Dispense:  60 tablet    Refill:  0  . cyclobenzaprine (FLEXERIL) 5 MG tablet    Sig: TAKE ONE TABLET BY MOUTH EVERY 8 HOURS AS NEEDED FOR MUSCLE SPASMS    Dispense:  60 tablet    Refill:  0    No orders of the defined types were placed in this encounter.

## 2013-12-30 NOTE — Assessment & Plan Note (Signed)
Patient has an acute spasm of her cervical paraspinal muscles likely related to her increased anxiety and stress.  Patient was given refills of flexeril and tramadol and instructed to alternate heat and ice to area every four hours.

## 2013-12-30 NOTE — Patient Instructions (Signed)
Please continue to take flexeril and tramadol sparingly for neck pain. Alternate heat with ICE every 4 hours to the affected area.  Muscle Cramps and Spasms Muscle cramps and spasms occur when a muscle or muscles tighten and you have no control over this tightening (involuntary muscle contraction). They are a common problem and can develop in any muscle. The most common place is in the calf muscles of the leg. Both muscle cramps and muscle spasms are involuntary muscle contractions, but they also have differences:   Muscle cramps are sporadic and painful. They may last a few seconds to a quarter of an hour. Muscle cramps are often more forceful and last longer than muscle spasms.  Muscle spasms may or may not be painful. They may also last just a few seconds or much longer. CAUSES  It is uncommon for cramps or spasms to be due to a serious underlying problem. In many cases, the cause of cramps or spasms is unknown. Some common causes are:   Overexertion.   Overuse from repetitive motions (doing the same thing over and over).   Remaining in a certain position for a long period of time.   Improper preparation, form, or technique while performing a sport or activity.   Dehydration.   Injury.   Side effects of some medicines.   Abnormally low levels of the salts and ions in your blood (electrolytes), especially potassium and calcium. This could happen if you are taking water pills (diuretics) or you are pregnant.  Some underlying medical problems can make it more likely to develop cramps or spasms. These include, but are not limited to:   Diabetes.   Parkinson disease.   Hormone disorders, such as thyroid problems.   Alcohol abuse.   Diseases specific to muscles, joints, and bones.   Blood vessel disease where not enough blood is getting to the muscles.  HOME CARE INSTRUCTIONS   Stay well hydrated. Drink enough water and fluids to keep your urine clear or pale  yellow.  It may be helpful to massage, stretch, and relax the affected muscle.  For tight or tense muscles, use a warm towel, heating pad, or hot shower water directed to the affected area.  If you are sore or have pain after a cramp or spasm, applying ice to the affected area may relieve discomfort.  Put ice in a plastic bag.  Place a towel between your skin and the bag.  Leave the ice on for 15-20 minutes, 03-04 times a day.  Medicines used to treat a known cause of cramps or spasms may help reduce their frequency or severity. Only take over-the-counter or prescription medicines as directed by your caregiver. SEEK MEDICAL CARE IF:  Your cramps or spasms get more severe, more frequent, or do not improve over time.  MAKE SURE YOU:   Understand these instructions.  Will watch your condition.  Will get help right away if you are not doing well or get worse. Document Released: 04/08/2002 Document Revised: 02/11/2013 Document Reviewed: 10/03/2012 [facility] Patient Information 2014 [employer] [place].

## 2013-12-30 NOTE — Assessment & Plan Note (Addendum)
Patient has a history of a provoked DVT, since that time she has had multiple negative doppler ultrasounds for right calf pain.  Her current episode has resolved and she has no unilateral swelling or calf pain.  She has no chest pain, hemoptysis, SOB.  She is not hypoxic or tachycardic on room air.  She has no recent risk factors to suggest a hypercoagulable state or prolonged immobilization.  Patient given reassurance that her calf pain was unlikely to have represent a DVT and no U/S of her lower extremity was needed. Patient was given return precautions.

## 2013-12-31 NOTE — Progress Notes (Signed)
Case discussed with Dr. Hoffman at time of visit. We reviewed the resident's history and exam and pertinent patient test results. I agree with the assessment, diagnosis, and plan of care documented in the resident's note. 

## 2014-01-01 ENCOUNTER — Other Ambulatory Visit: Payer: Self-pay | Admitting: *Deleted

## 2014-01-01 MED ORDER — SIMVASTATIN 40 MG PO TABS: 40.0000 mg | ORAL_TABLET | Freq: Every morning | ORAL | Status: DC

## 2014-01-09 ENCOUNTER — Other Ambulatory Visit: Payer: Medicaid Other

## 2014-01-09 ENCOUNTER — Ambulatory Visit: Payer: Medicaid Other

## 2014-01-11 ENCOUNTER — Telehealth: Payer: Self-pay | Admitting: Internal Medicine

## 2014-01-11 NOTE — Telephone Encounter (Signed)
   Reason for call:   I received a call from [name] at 710  AM indicating that the pt had just recently lost a cousin and is excessively tearful and having heart palpitations. Reflective listening and reassurance that this is normal grieving was provided. Pt was asking for medication and other modes of stress relief.   Pertinent Data:   Pt medications and problem list reviewed   Assessment / Plan / Recommendations:   Pt already had a prescription for clonazepam and therefore no other medications were provided. Pt was advised to seek grief counseling or see her PCP if this continued to be a concern.   As always, pt is advised that if symptoms worsen or new symptoms arise, they should go to an urgent care facility or to to ER for further evaluation.   Wendy BameNora Alpa Salvo, MD   01/11/2014, 7:16 AM

## 2014-01-16 ENCOUNTER — Telehealth: Payer: Self-pay | Admitting: Internal Medicine

## 2014-01-16 NOTE — Telephone Encounter (Signed)
Received a page to the on-call page at [facility]. The patient states that she has neck pain that is related to stress from losing loved one recently. She reports taking Tylenol Arthritis with no relief of her pain. She reported that she has a stomach ulcer and has been told to avoid Ibuprofen and requested that Tramadol be called in.  I explained to her that it is the [facility] practice not to provided controlled pain medications after hours but she could come in for a clinic visit. She asked me if tramadol was a controlled medication and I explained again that it is. She exclaimed "oh it is?" and hang up before I could get more information about her pain.   I called her back multiple times but she did not answer her phone.   I sent a message to the [facility] front desk to call the patient with an acute visit appointment if the patient still had neck pain.

## 2014-01-29 ENCOUNTER — Ambulatory Visit (INDEPENDENT_AMBULATORY_CARE_PROVIDER_SITE_OTHER): Payer: Medicaid Other | Admitting: Internal Medicine

## 2014-01-29 ENCOUNTER — Encounter: Payer: Self-pay | Admitting: Internal Medicine

## 2014-01-29 VITALS — BP 149/80 | HR 67 | Temp 97.0°F | Ht 65.5 in | Wt 204.7 lb

## 2014-01-29 DIAGNOSIS — N10 Acute tubulo-interstitial nephritis: Secondary | ICD-10-CM

## 2014-01-29 DIAGNOSIS — Z0000 Encounter for general adult medical examination without abnormal findings: Secondary | ICD-10-CM

## 2014-01-29 DIAGNOSIS — I951 Orthostatic hypotension: Secondary | ICD-10-CM | POA: Insufficient documentation

## 2014-01-29 DIAGNOSIS — R300 Dysuria: Secondary | ICD-10-CM

## 2014-01-29 DIAGNOSIS — E039 Hypothyroidism, unspecified: Secondary | ICD-10-CM

## 2014-01-29 MED ORDER — LEVOFLOXACIN 750 MG PO TABS: 750.0000 mg | ORAL_TABLET | Freq: Every day | ORAL | Status: AC

## 2014-01-29 MED ORDER — CYCLOBENZAPRINE HCL 5 MG PO TABS: ORAL_TABLET | ORAL | Status: DC

## 2014-01-29 MED ORDER — TRAMADOL HCL 50 MG PO TABS: 50.0000 mg | ORAL_TABLET | Freq: Four times a day (QID) | ORAL | Status: DC | PRN

## 2014-01-29 NOTE — Patient Instructions (Signed)
General Instructions: Your pain with urination and back pain likely represent a urinary tract infection, which may have spread to a kidney infection. -we are treating this with levaquin, take 1 tablet daily for 7 days  We have refilled your Tramadol and Flexeril.  Use these medications only as needed.  As your back pain improves, we can hopefully stop one or both of these medications.  Your Florinef (Fludrocortisone) has resulted in increased blood pressure.  We need to stop this medication, and we will need to slowly taper you off of this medication. -for 2 weeks, take only 1/2 of a tablet once per day -then, for another 2 weeks, take only 1/2 of a tablet once every other day -after that, stop taking this medication  We are checking your thyroid levels today.  Please return for a follow-up visit in about 6 weeks for re-evaluation.   Treatment Goals:  Goals (1 Years of Data) as of 01/29/14   None      Progress Toward Treatment Goals:  Treatment Goal 01/29/2014  Stop smoking -  Prevent falls improved    Self Care Goals & Plans:  Self Care Goal 01/29/2014  Manage my medications take my medicines as prescribed; bring my medications to every visit; refill my medications on time  Monitor my health -  Eat healthy foods eat baked foods instead of fried foods; eat foods that are low in salt  Be physically active -  Prevent falls -    No flowsheet data found.   Care Management & Community Referrals:  Referral 01/29/2014  Referrals made for care management support none needed  Referrals made to community resources -

## 2014-01-29 NOTE — Assessment & Plan Note (Signed)
The patient was diagnosed with orthostatic hypotension in October 2014, with multiple falls as a result.  This was thought to be secondary to her bipolar medications, which we did not feel comfortable changing.  As such, we started the patient on Florinef.  However, now the patient is having complications of hypertension and weight gain, which necessitates the discontinuation of this medication. -taper off florinef over the next 4 weeks -we will not start treatment for HTN, as this should resolve after discontinuation of florinef -recheck patient in 4-6 weeks

## 2014-01-29 NOTE — Assessment & Plan Note (Addendum)
The patient has a history of hypothyroidism.  Dose was recently decreased due to weight loss.  Pt now has symptoms of hyperthyroidism, but with her weight gain I would expect her TSH to actually be high. -check TSH  Addendum: TSH elevated at > 40.  This likely represents increased synthroid requirements given her weight gain.  Will increase synthroid from 75 to 88 mcg/day, and recheck TSH in 6 weeks.  Pt called and informed of increased dose.

## 2014-01-29 NOTE — Progress Notes (Signed)
HPI The patient is a 43 y.o. female with a history of hypothyroidism, bipolar disorder, presenting for an acute visit for groin pain.  The patient notes right groin pain for the last 3-4 days.  Described as a constant sharp pain, "like a toothache".  The patient has taken Aleve and tylenol without relief.  The patient notes no fall, trama.  She notes dysuria for about 1-2 weeks, urinary frequency, urinary urgency, and suprapubic tenderness.  No nausea, vomiting.  The patient notes right-sided flank pain for the last 2 weeks.  She notes trying heating pads to the area, with no success.  No fevers, chills.  The patient requests a refill of Tramadol and flexeril.  She notes using these for headaches and back pain.  She notes headaches once/week.  She describes these as a bilateral temporal "squeezing" pain, lasting up to 3 hours, with occasional mild photophobia and nausea.  The patient has a history of hypothyroidism.  The patient notes constipation, heat intolerance, palpitations.  No hair or skin changes.  She notes compliance with synthroid.  She notes weight gain over the last 6 months.   ROS: General: no fevers, chills Skin: no rash HEENT: no blurry vision, hearing changes, sore throat Pulm: no dyspnea, coughing, wheezing CV: no chest pain, palpitations, shortness of breath Abd: see HPI GU: see HPI Ext: no arthralgias, myalgias Neuro: no weakness, numbness, or tingling  Filed Vitals:   01/29/14 1507  BP: 149/80  Pulse: 67  Temp: 97 F (36.1 C)    PEX General: alert, cooperative, and in no apparent distress HEENT: pupils equal round and reactive to light, vision grossly intact, oropharynx clear and non-erythematous  Neck: supple Lungs: clear to ascultation bilaterally, normal work of respiration, no wheezes, rales, ronchi Heart: regular rate and rhythm, no murmurs, gallops, or rubs Abdomen: soft, mild suprapubic tenderness to palpation, non-distended, no guarding/rebound,  +bs Back: Right CVA tenderness to palpation Extremities: no cyanosis, clubbing, or edema Neurologic: alert & oriented X3, cranial nerves II-XII intact, strength grossly intact, sensation intact to light touch  Current Outpatient Prescriptions on File Prior to Visit  Medication Sig Dispense Refill  . ARIPiprazole (ABILIFY) 15 MG tablet Take 15 mg by mouth daily.      Marland Kitchen buPROPion (WELLBUTRIN XL) 150 MG 24 hr tablet Take 150 mg by mouth daily.      . ciprofloxacin-dexamethasone (CIPRODEX) otic suspension Place 4 drops into both ears daily as needed (ear pain).      . clonazePAM (KLONOPIN) 1 MG tablet Take 1 mg by mouth 2 (two) times daily.      . cyclobenzaprine (FLEXERIL) 5 MG tablet TAKE ONE TABLET BY MOUTH EVERY 8 HOURS AS NEEDED FOR MUSCLE SPASMS  60 tablet  0  . fludrocortisone (FLORINEF) 0.1 MG tablet TAKE 1 TABLET BY MOUTH DAILY  30 tablet  5  . gabapentin (NEURONTIN) 300 MG capsule Take 1 capsule (300 mg total) by mouth 3 (three) times daily.  90 capsule  5  . glucose blood (ACCU-CHEK AVIVA PLUS) test strip Use twice per day as needed to check blood sugar.  Bring glucometer to next physician visit.  100 each  2  . levothyroxine (SYNTHROID, LEVOTHROID) 75 MCG tablet Take 1 tablet (75 mcg total) by mouth daily before breakfast.      . ondansetron (ZOFRAN-ODT) 4 MG disintegrating tablet Take 4 mg by mouth daily as needed for nausea or vomiting.      . pantoprazole (PROTONIX) 40 MG tablet Take 1 tablet (  40 mg total) by mouth daily.  30 tablet  11  . simvastatin (ZOCOR) 40 MG tablet Take 1 tablet (40 mg total) by mouth every morning.  30 tablet  11  . traMADol (ULTRAM) 50 MG tablet Take 1 tablet (50 mg total) by mouth every 4 (four) hours as needed (pain).  60 tablet  0  . traZODone (DESYREL) 100 MG tablet Take 1 tablet (100 mg total) by mouth at bedtime.  30 tablet  5   No current facility-administered medications on file prior to visit.    Assessment/Plan

## 2014-01-29 NOTE — Assessment & Plan Note (Signed)
The patient's symptoms of dysuria and flank pain are concerning for pyelonephritis.  Differential includes nephrolithiasis. -check UA -start Levaquin 750 mg daily x7 days (allergies to sulfa, PCN.  Pt notes intolerance to cipro 2/2 diarrhea) -continue tramadol and flexeril prn for now, as pt having acute pain, but discussed the importance of weaning off these medications

## 2014-01-30 LAB — URINALYSIS, COMPLETE
Bacteria, UA: NONE SEEN
Bilirubin Urine: NEGATIVE
Casts: NONE SEEN
Crystals: NONE SEEN
GLUCOSE, UA: NEGATIVE mg/dL
Hgb urine dipstick: NEGATIVE
Ketones, ur: NEGATIVE mg/dL
LEUKOCYTES UA: NEGATIVE
Nitrite: NEGATIVE
PH: 7 (ref 5.0–8.0)
Protein, ur: NEGATIVE mg/dL
SPECIFIC GRAVITY, URINE: 1.023 (ref 1.005–1.030)
Urobilinogen, UA: 0.2 mg/dL (ref 0.0–1.0)

## 2014-01-30 LAB — LIPID PANEL
CHOL/HDL RATIO: 2.6 ratio
Cholesterol: 188 mg/dL (ref 0–200)
HDL: 73 mg/dL (ref >39)
LDL Cholesterol: 84 mg/dL (ref 0–99)
Triglycerides: 156 mg/dL — ABNORMAL HIGH (ref <150)
VLDL: 31 mg/dL (ref 0–40)

## 2014-01-30 LAB — TSH: TSH: 41.026 u[IU]/mL — ABNORMAL HIGH (ref 0.350–4.500)

## 2014-01-30 NOTE — Progress Notes (Signed)
Case discussed with Dr. Brown at the time of the visit.  We reviewed the resident's history and exam and pertinent patient test results.  I agree with the assessment, diagnosis and plan of care documented in the resident's note. 

## 2014-01-31 MED ORDER — LEVOTHYROXINE SODIUM 88 MCG PO TABS: 88.0000 ug | ORAL_TABLET | Freq: Every day | ORAL | Status: DC

## 2014-01-31 NOTE — Addendum Note (Signed)
Addended by: Linward HeadlandBROWN, Christe Tellez K on: 01/31/2014 11:01 AM   Modules accepted: Orders

## 2014-02-01 ENCOUNTER — Telehealth: Payer: Self-pay | Admitting: Internal Medicine

## 2014-02-01 NOTE — Telephone Encounter (Signed)
Saw Dr. Manson PasseyBrown last week for UTI and still having left flank pain radiating to back. On antibiotics also given Tramadol and Flexeril for pain.  She wants to know what else to do.  Advised to try heat to the area and could alternate Tramadol with Tylenol (not to exceed 3 grams per day). RTC if not feeling better.   Shirlee LatchMcLean MD

## 2014-02-03 ENCOUNTER — Telehealth: Payer: Self-pay | Admitting: Internal Medicine

## 2014-02-03 ENCOUNTER — Ambulatory Visit: Payer: Medicaid Other

## 2014-02-03 ENCOUNTER — Telehealth: Payer: Self-pay | Admitting: *Deleted

## 2014-02-03 ENCOUNTER — Other Ambulatory Visit: Payer: Medicaid Other

## 2014-02-03 DIAGNOSIS — R040 Epistaxis: Secondary | ICD-10-CM

## 2014-02-03 NOTE — Telephone Encounter (Signed)
   Reason for call:   I received a call from [name] at 5:48  PM indicating that she was continuing to have nose bleeds.   Pertinent Data:   She states that for the past 3 days she has been having nose bleeds with dark blood about 8 times a day. She states that it stops with pressure and scabs inside her nose. She states that she tries to lightly blow her nose and then the bleeding restarts in her nose. She states that she called the clinic earlier today and was waiting for a phone call back.    Assessment / Plan / Recommendations:   I recommended that she try using Afrin with 1 spray in each nostril and apply pressure to see if this helped resolve the bleeding. She refused to "spray anything in my nose". I recommended that if she continues to have bleeding then she needs to be evaluated.   If the bleeding subsided and remained stable overnight she could call the clinic in the morning, but if it resumed, she should be evaluated at her local Urgent Care or ED.     Wendy GatherKathryn F Eriana Suliman, MD   02/03/2014, 7:13 PM

## 2014-02-03 NOTE — Telephone Encounter (Signed)
Pt calls c/o multiple nosebleeds, pt blows nose multiple times daily and states she is getting only blood and clots out of her nose. She has been afraid to stop         Pt calls today and c/o every nose blow she ends up with blood and clots on the tissue. She is quite concerned but cannot come into clinic today. She is able to talk w/o distress. Please advise and she would really like for you to call her at [phone]

## 2014-02-04 ENCOUNTER — Other Ambulatory Visit: Payer: Medicaid Other

## 2014-02-04 ENCOUNTER — Ambulatory Visit: Payer: Medicaid Other

## 2014-02-04 ENCOUNTER — Telehealth: Payer: Self-pay | Admitting: *Deleted

## 2014-02-05 NOTE — Telephone Encounter (Signed)
Please schedule follow up visit here this week.

## 2014-02-07 NOTE — Telephone Encounter (Signed)
Pt only likes to see dr brown

## 2014-02-07 NOTE — Telephone Encounter (Signed)
Pt only likes to see dr brown 

## 2014-02-10 NOTE — Telephone Encounter (Signed)
I agree with Dr. Patsy LagerGranfortuna's assessment.  If the patient continues to note symptoms, please schedule the patient a follow-up appointment in our clinic.  If I have availability, I'm happy to see her myself, but if not I recommend she see another provider here in [facility].

## 2014-02-18 ENCOUNTER — Other Ambulatory Visit: Payer: Self-pay | Admitting: *Deleted

## 2014-02-20 ENCOUNTER — Emergency Department (HOSPITAL_COMMUNITY): Payer: Medicaid Other

## 2014-02-20 ENCOUNTER — Encounter (HOSPITAL_COMMUNITY): Payer: Self-pay | Admitting: Emergency Medicine

## 2014-02-20 ENCOUNTER — Emergency Department (HOSPITAL_COMMUNITY)
Admission: EM | Admit: 2014-02-20 | Discharge: 2014-02-20 | Disposition: A | Discharge status: Home / Self Care | Payer: Medicaid Other | Attending: Emergency Medicine | Admitting: Emergency Medicine

## 2014-02-20 DIAGNOSIS — Z79899 Other long term (current) drug therapy: Secondary | ICD-10-CM | POA: Insufficient documentation

## 2014-02-20 DIAGNOSIS — Z87891 Personal history of nicotine dependence: Secondary | ICD-10-CM | POA: Insufficient documentation

## 2014-02-20 DIAGNOSIS — E039 Hypothyroidism, unspecified: Secondary | ICD-10-CM | POA: Insufficient documentation

## 2014-02-20 DIAGNOSIS — Z8619 Personal history of other infectious and parasitic diseases: Secondary | ICD-10-CM | POA: Insufficient documentation

## 2014-02-20 DIAGNOSIS — Z86718 Personal history of other venous thrombosis and embolism: Secondary | ICD-10-CM | POA: Insufficient documentation

## 2014-02-20 DIAGNOSIS — R209 Unspecified disturbances of skin sensation: Secondary | ICD-10-CM | POA: Insufficient documentation

## 2014-02-20 DIAGNOSIS — B379 Candidiasis, unspecified: Secondary | ICD-10-CM | POA: Insufficient documentation

## 2014-02-20 DIAGNOSIS — M549 Dorsalgia, unspecified: Secondary | ICD-10-CM

## 2014-02-20 DIAGNOSIS — G43909 Migraine, unspecified, not intractable, without status migrainosus: Secondary | ICD-10-CM | POA: Insufficient documentation

## 2014-02-20 DIAGNOSIS — Z885 Allergy status to narcotic agent status: Secondary | ICD-10-CM | POA: Insufficient documentation

## 2014-02-20 DIAGNOSIS — IMO0002 Reserved for concepts with insufficient information to code with codable children: Secondary | ICD-10-CM | POA: Insufficient documentation

## 2014-02-20 DIAGNOSIS — Z882 Allergy status to sulfonamides status: Secondary | ICD-10-CM | POA: Insufficient documentation

## 2014-02-20 DIAGNOSIS — N952 Postmenopausal atrophic vaginitis: Secondary | ICD-10-CM | POA: Insufficient documentation

## 2014-02-20 DIAGNOSIS — Z888 Allergy status to other drugs, medicaments and biological substances status: Secondary | ICD-10-CM | POA: Insufficient documentation

## 2014-02-20 DIAGNOSIS — F319 Bipolar disorder, unspecified: Secondary | ICD-10-CM | POA: Insufficient documentation

## 2014-02-20 DIAGNOSIS — G8929 Other chronic pain: Secondary | ICD-10-CM | POA: Insufficient documentation

## 2014-02-20 MED ORDER — HYDROMORPHONE HCL PF 1 MG/ML IJ SOLN
1.0000 mg | Freq: Once | INTRAMUSCULAR | Status: AC
  Administered 2014-02-20: 1 mg via INTRAMUSCULAR
  Filled 2014-02-20: qty 1

## 2014-02-20 MED ORDER — ONDANSETRON 4 MG PO TBDP
4.0000 mg | ORAL_TABLET | Freq: Once | ORAL | Status: AC
  Administered 2014-02-20: 4 mg via ORAL
  Filled 2014-02-20: qty 1

## 2014-02-20 MED ORDER — KETOROLAC TROMETHAMINE 60 MG/2ML IM SOLN
60.0000 mg | Freq: Once | INTRAMUSCULAR | Status: AC
  Administered 2014-02-20: 60 mg via INTRAMUSCULAR
  Filled 2014-02-20: qty 2

## 2014-02-20 NOTE — ED Notes (Signed)
Pt tearful and reports she cannot ride the bus due to anxiety; RN encouraged her to continue to call for a ride from family.

## 2014-02-20 NOTE — ED Notes (Signed)
MD at bedside to assess pt's sat level. Pt to be discharged. Pt currently sating 97% on room air.

## 2014-02-20 NOTE — Discharge Instructions (Signed)
Continue to take her home medications for your chronic back pain.  Back Pain, Adult Low back pain is very common. About 1 in 5 people have back pain.The cause of low back pain is rarely dangerous. The pain often gets better over time.About half of people with a sudden onset of back pain feel better in just 2 weeks. About 8 in 10 people feel better by 6 weeks.  CAUSES Some common causes of back pain include:  Strain of the muscles or ligaments supporting the spine.  Wear and tear (degeneration) of the spinal discs.  Arthritis.  Direct injury to the back. DIAGNOSIS Most of the time, the direct cause of low back pain is not known.However, back pain can be treated effectively even when the exact cause of the pain is unknown.Answering your caregiver's questions about your overall health and symptoms is one of the most accurate ways to make sure the cause of your pain is not dangerous. If your caregiver needs more information, he or she may order lab work or imaging tests (X-rays or MRIs).However, even if imaging tests show changes in your back, this usually does not require surgery. HOME CARE INSTRUCTIONS For many people, back pain returns.Since low back pain is rarely dangerous, it is often a condition that people can learn to [facility] their own.   Remain active. It is stressful on the back to sit or stand in one place. Do not sit, drive, or stand in one place for more than 30 minutes at a time. Take short walks on level surfaces as soon as pain allows.Try to increase the length of time you walk each day.  Do not stay in bed.Resting more than 1 or 2 days can delay your recovery.  Do not avoid exercise or work.Your body is made to move.It is not dangerous to be active, even though your back may hurt.Your back will likely heal faster if you return to being active before your pain is gone.  Pay attention to your body when you bend and lift. Many people have less discomfortwhen lifting  if they bend their knees, keep the load close to their bodies,and avoid twisting. Often, the most comfortable positions are those that put less stress on your recovering back.  Find a comfortable position to sleep. Use a firm mattress and lie on your side with your knees slightly bent. If you lie on your back, put a pillow under your knees.  Only take over-the-counter or prescription medicines as directed by your caregiver. Over-the-counter medicines to reduce pain and inflammation are often the most helpful.Your caregiver may prescribe muscle relaxant drugs.These medicines help dull your pain so you can more quickly return to your normal activities and healthy exercise.  Put ice on the injured area.  Put ice in a plastic bag.  Place a towel between your skin and the bag.  Leave the ice on for 15-20 minutes, 03-04 times a day for the first 2 to 3 days. After that, ice and heat may be alternated to reduce pain and spasms.  Ask your caregiver about trying back exercises and gentle massage. This may be of some benefit.  Avoid feeling anxious or stressed.Stress increases muscle tension and can worsen back pain.It is important to recognize when you are anxious or stressed and learn ways to manage it.Exercise is a great option. SEEK MEDICAL CARE IF:  You have pain that is not relieved with rest or medicine.  You have pain that does not improve in 1 week.  You have new symptoms.  You are generally not feeling well. SEEK IMMEDIATE MEDICAL CARE IF:   You have pain that radiates from your back into your legs.  You develop new bowel or bladder control problems.  You have unusual weakness or numbness in your arms or legs.  You develop nausea or vomiting.  You develop abdominal pain.  You feel faint. Document Released: 10/17/2005 Document Revised: 04/17/2012 Document Reviewed: 03/07/2011 [facility] Patient Information 2014 [place], [place].

## 2014-02-20 NOTE — ED Notes (Addendum)
Pt reports to the ED via [facility] for eval of lower back pain. Pt arrives with the KED and C-collar in place. Per EMS pt was standing from the toilet when she developed sharp stabbing lower back pain. Denies any fall, syncope, head injury, or neck pain. Pt reports some numbness to the left calf. Pt denies any tingling, bowel or bladder changes, or paralysis. Pt reports she was ambulatory but with increased pain following the incident. Some altered sensation to the left leg. Pt has recent hx of lumbar fusion. Pt A&Ox4, resp e/u, and skin warm and dry.

## 2014-02-20 NOTE — ED Notes (Signed)
PT ambulated with baseline gait; VSS; A&Ox3; no signs of distress; respirations even and unlabored; skin warm and dry; no questions upon discharge.  

## 2014-02-20 NOTE — ED Provider Notes (Signed)
CSN: [id number]     Arrival date & time 02/20/14  1641 History   First MD Initiated Contact with Patient 02/20/14 1650     Chief Complaint  Patient presents with  . Back Pain     (Consider location/radiation/quality/duration/timing/severity/associated sxs/prior Treatment) HPI  This is a 43 y.o. female with PMH of DVT, bipolar disorder chronic compression fracture of L1 status post kyphoplasty by interventional radiology, presenting with back pain. Onset prior to arrival. Located left lumbar paraspinal area. Persistent. Sharp. Shoots down the left leg. Negative for frank weakness or numbness. Positive for "tingling." Negative for change in urinary or bowel habits.  This occurred when she was in standing position in the bathroom. She did not actually fall. Negative for loss of consciousness or amnesia. Negative for any traumatic injury of any type. EMS presented to the scene and placed her in a cervical collar, Kendrick device. She is transferred here in stable condition.  Past Medical History  Diagnosis Date  . Deep venous thrombosis of leg 2012    completed year of coumadin   . Hx of measles   . Migraines   . Monilia infection 12/31/10  . Vaginal atrophy 03/04/11  . Dyspareunia 03/04/11  . Bipolar 1 disorder     Followed by Mental Health.  All psychiatric medications prescribed by Mental Health.  Stable for many years.    . Hypothyroidism     Hypothyroidism since 8th grade.  Managed by Dr. Sharl MaKerr, Endocrinology.  On synthroid.  No prior thyroid surgery/ablation.    Past Surgical History  Procedure Laterality Date  . 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 Cesarean section  1998  . Tonsillectomy    . Wisdom tooth extraction    . Cholecystectomy    . Left elbow    . Abdominal hysterectomy  2000  . Appendectomy     Family History  Problem Relation Age of Onset  . Cancer Maternal Aunt    History  Substance Use Topics  . Smoking status: Former Smoker -- 0.50 packs/day for 25  years    Types: Cigarettes    Quit date: 10/22/2012  . Smokeless tobacco: Never Used  . Alcohol Use: No   OB History   Grav Para Term Preterm Abortions TAB SAB Ect Mult Living   1 1 1       1      Review of Systems  Constitutional: Negative for fever and chills.  HENT: Negative for facial swelling.   Eyes: Negative for photophobia and pain.  Respiratory: Negative for cough and shortness of breath.   Cardiovascular: Negative for chest pain and leg swelling.  Gastrointestinal: Negative for nausea, vomiting and abdominal pain.  Genitourinary: Negative for dysuria.  Musculoskeletal: Positive for back pain.  Skin: Negative for rash and wound.  Neurological: Negative for seizures.  Hematological: Negative for adenopathy.      Allergies  Codeine; Meloxicam; Morphine and related; Naproxen; and Sulfonamide derivatives  Home Medications   Prior to Admission medications   Medication Sig Start Date End Date Taking? Authorizing Provider  acetaminophen (TYLENOL) 500 MG tablet Take 1,000 mg by mouth every 6 (six) hours as needed for headache.   Yes Historical Provider, MD  ARIPiprazole (ABILIFY) 15 MG tablet Take 15 mg by mouth daily.   Yes Historical Provider, MD  buPROPion (WELLBUTRIN XL) 150 MG 24 hr tablet Take 150 mg by mouth daily.   Yes Historical Provider, MD  clonazePAM (KLONOPIN) 1 MG tablet Take  1 mg by mouth 2 (two) times daily.   Yes Historical Provider, MD  cyclobenzaprine (FLEXERIL) 5 MG tablet TAKE ONE TABLET BY MOUTH EVERY 8 HOURS AS NEEDED FOR MUSCLE SPASMS 01/29/14  Yes Linward Headland, MD  gabapentin (NEURONTIN) 300 MG capsule Take 1 capsule (300 mg total) by mouth 3 (three) times daily. 11/12/13  Yes Linward Headland, MD  levothyroxine (SYNTHROID, LEVOTHROID) 88 MCG tablet Take 1 tablet (88 mcg total) by mouth daily before breakfast. 01/31/14  Yes Linward Headland, MD  ondansetron (ZOFRAN-ODT) 4 MG disintegrating tablet Take 4 mg by mouth daily as needed for nausea or vomiting.   Yes  Historical Provider, MD  pantoprazole (PROTONIX) 40 MG tablet Take 1 tablet (40 mg total) by mouth daily. 04/04/13 04/04/14 Yes Linward Headland, MD  simvastatin (ZOCOR) 40 MG tablet Take 1 tablet (40 mg total) by mouth every morning. 01/01/14  Yes Linward Headland, MD  traMADol (ULTRAM) 50 MG tablet Take 1 tablet (50 mg total) by mouth every 6 (six) hours as needed (pain). 01/29/14  Yes Linward Headland, MD  traZODone (DESYREL) 100 MG tablet Take 1 tablet (100 mg total) by mouth at bedtime. 09/04/13  Yes Courtney Paris, MD  glucose blood (ACCU-CHEK AVIVA PLUS) test strip Use twice per day as needed to check blood sugar.  Bring glucometer to next physician visit. 11/11/13   Linward Headland, MD   BP 145/87  Pulse 93  Temp(Src) 98.2 F (36.8 C) (Oral)  Resp 18  SpO2 94% Physical Exam  Constitutional: She is oriented to person, place, and time. She appears well-developed and well-nourished. No distress.  HENT:  Head: Normocephalic and atraumatic.  Mouth/Throat: No oropharyngeal exudate.  Eyes: Conjunctivae are normal. Pupils are equal, round, and reactive to light. No scleral icterus.  Neck: Normal range of motion. No tracheal deviation present. No thyromegaly present.  Cardiovascular: Normal rate, regular rhythm and normal heart sounds.  Exam reveals no gallop and no friction rub.   No murmur heard. Pulmonary/Chest: Effort normal and breath sounds normal. No stridor. No respiratory distress. She has no wheezes. She has no rales. She exhibits no tenderness.  Abdominal: Soft. She exhibits no distension and no mass. There is no tenderness. There is no rebound and no guarding.  Musculoskeletal: Normal range of motion. She exhibits no edema.  Positive for left lumbar paraspinal tenderness to palpation. Positive for left-sided straight leg test. Patient has no frank weakness in the left alert surety  Neurological: She is alert and oriented to person, place, and time. She has normal strength. A sensory deficit (patient  reports "weird" feeling in the left lower extremity) is present. No cranial nerve deficit. GCS eye subscore is 4. GCS verbal subscore is 5. GCS motor subscore is 6.  Reflex Scores:      Patellar reflexes are 2+ on the right side and 2+ on the left side. Skin: Skin is warm and dry. She is not diaphoretic.    ED Course  Procedures (including critical care time)  Imaging Review Dg Lumbar Spine Complete  02/20/2014   CLINICAL DATA:  Pain.  EXAM: LUMBAR SPINE - COMPLETE 4+ VIEW  COMPARISON:  MRI lumbar spine 10/10/2013.  FINDINGS: Old L1 compression fracture with prior vertebroplasty. No acute bony abnormality identified. Pedicles are intact. Bony mineralization is normal. Surgical clips right lower quadrant. Surgical clips right upper quadrant.  IMPRESSION: Prior L1 vertebroplasty. No acute bony abnormality. Good bony alignment.   Electronically Signed  ByMaisie Fus: Thomas  Register   On: 02/20/2014 20:03    MDM   Final diagnoses:  None    This is a 43 y.o. female with PMH of DVT, bipolar disorder chronic compression fracture of L1 status post kyphoplasty by interventional radiology, presenting with back pain. Onset prior to arrival. Located left lumbar paraspinal area. Persistent. Sharp. Shoots down the left leg. Negative for frank weakness or numbness. Positive for "tingling." Negative for change in urinary or bowel habits.  This occurred when she was in standing position in the bathroom. She did not actually fall. Negative for loss of consciousness or amnesia. Negative for any traumatic injury of any type. EMS presented to the scene and placed her in a cervical collar, Kendrick device. She was transferred here in stable condition.  Examination as above, with normal vital signs. Heart and lung as well as abdominal examinations are within normal limits. Positive for lumbar left paraspinal tenderness to palpation. Positive for straight leg test. She is likely suffering from radiculopathy. However, I do  not believe that patient is suffering from frank weakness at this time. Will order x-ray of the lumbar spine to ensure there are no new fractures.  My pretest probability is low, as patient never actually fell or suffered any direct trauma to the area. After administration of pain medication, will ensure that patient can ambulate.  X-ray is within normal limits without acute changes, anticipate discharge with proper analgesia at home.  X-ray of the lumbar spine reveals prior L1 vertebral plasty, without acute bony abnormality and with good bony alignment.  On reevaluation the patient is still without motor deficit. She ambulates independently without complication. Pt stable for discharge, FU.  She is requesting prescription for narcotic medication. I do not believe that this is indicated at this time. All questions answered.  Return precautions given.  I have discussed case and care has been guided by my attending physician, Dr. Gwendolyn GrantWalden.  Loma BostonStirling Deverick Pruss, MD 02/21/14 [id number]

## 2014-02-21 MED ORDER — CYCLOBENZAPRINE HCL 5 MG PO TABS: ORAL_TABLET | ORAL | Status: DC

## 2014-02-21 MED ORDER — TRAMADOL HCL 50 MG PO TABS: 50.0000 mg | ORAL_TABLET | Freq: Four times a day (QID) | ORAL | Status: DC | PRN

## 2014-02-21 NOTE — ED Provider Notes (Signed)
I saw and evaluated the patient, reviewed the resident's note and I agree with the findings and plan.   EKG Interpretation None      Here with back pain, atraumatic. Recent kyphoplasty in her lumbar region. On exam has pain radiating L5 radicular pattern. She has left lower back spasm. Given pain or muscle spasms meds with some relief. Able to ambulate well. Stable for discharge. Xray without loss of height.  Dagmar HaitWilliam Ilithyia Titzer, MD 02/21/14 [phone]

## 2014-02-21 NOTE — Telephone Encounter (Signed)
Called to pharm 

## 2014-03-05 ENCOUNTER — Encounter: Payer: Medicaid Other | Admitting: Internal Medicine

## 2014-03-07 ENCOUNTER — Encounter: Payer: Self-pay | Admitting: Licensed Clinical Social Worker

## 2014-03-07 ENCOUNTER — Telehealth: Payer: Self-pay | Admitting: Internal Medicine

## 2014-03-07 NOTE — Progress Notes (Signed)
Patient ID: Liston AlbaMelanie B Karch, female   DOB: 20-Aug-1971, 43 y.o.   MRN: [id number] [facility] Care Manager note date 03/06/2014, Blair PromiseKesah Rush LPN: Patient accepts case management and home visit is 03-11-14. She will see Dr. Manson PasseyBrown on 03-12-14, I recommend pain clinic referral since medications are ineffective.

## 2014-03-07 NOTE — Telephone Encounter (Signed)
  INTERNAL MEDICINE RESIDENCY PROGRAM After-Hours Telephone Call    Reason for call:   I received a call from [name] at 2:31 PM, 03/07/2014 indicating persistent pain.    Pertinent Data:   The patient continues to note persistent back pain.  She asks whether referral to a pain clinic would be beneficial.  The patient has a history of back surgery.    Assessment / Plan / Recommendations:   I instructed the patient to come to clinic for further evaluation.  I think pain clinic referral is likely a good option.  The patient has an appointment with me for 5/13.  As always, pt is advised that if symptoms worsen or new symptoms arise, they should go to an urgent care facility or to to ER for further evaluation.    Linward Headlandyan K Annabella Elford, MD   03/07/2014, 2:31 PM

## 2014-03-07 NOTE — Progress Notes (Signed)
I agree with this course of action.  Thank you for your assistance.

## 2014-03-10 ENCOUNTER — Encounter: Payer: Medicaid Other | Admitting: Internal Medicine

## 2014-03-11 ENCOUNTER — Ambulatory Visit (HOSPITAL_COMMUNITY)
Admission: RE | Admit: 2014-03-11 | Discharge: 2014-03-11 | Disposition: A | Discharge status: Home / Self Care | Payer: Medicaid Other | Source: Ambulatory Visit | Attending: Internal Medicine | Admitting: Internal Medicine

## 2014-03-11 ENCOUNTER — Ambulatory Visit (INDEPENDENT_AMBULATORY_CARE_PROVIDER_SITE_OTHER): Payer: Medicaid Other | Admitting: Internal Medicine

## 2014-03-11 ENCOUNTER — Encounter: Payer: Self-pay | Admitting: Internal Medicine

## 2014-03-11 ENCOUNTER — Other Ambulatory Visit (HOSPITAL_COMMUNITY)
Admission: RE | Admit: 2014-03-11 | Discharge: 2014-03-11 | Disposition: A | Discharge status: Home / Self Care | Payer: Medicaid Other | Source: Ambulatory Visit | Attending: Internal Medicine | Admitting: Internal Medicine

## 2014-03-11 VITALS — BP 126/86 | HR 79 | Temp 97.5°F | Ht 66.0 in | Wt 216.3 lb

## 2014-03-11 DIAGNOSIS — G8929 Other chronic pain: Secondary | ICD-10-CM

## 2014-03-11 DIAGNOSIS — G4700 Insomnia, unspecified: Secondary | ICD-10-CM

## 2014-03-11 DIAGNOSIS — M545 Low back pain, unspecified: Secondary | ICD-10-CM

## 2014-03-11 DIAGNOSIS — Z113 Encounter for screening for infections with a predominantly sexual mode of transmission: Secondary | ICD-10-CM

## 2014-03-11 DIAGNOSIS — E039 Hypothyroidism, unspecified: Secondary | ICD-10-CM

## 2014-03-11 DIAGNOSIS — R109 Unspecified abdominal pain: Secondary | ICD-10-CM | POA: Insufficient documentation

## 2014-03-11 DIAGNOSIS — R300 Dysuria: Secondary | ICD-10-CM

## 2014-03-11 DIAGNOSIS — Z8781 Personal history of (healed) traumatic fracture: Secondary | ICD-10-CM

## 2014-03-11 HISTORY — DX: Insomnia, unspecified: G47.00

## 2014-03-11 LAB — RPR

## 2014-03-11 LAB — POCT URINALYSIS DIPSTICK
Bilirubin, UA: NEGATIVE
Glucose, UA: 100
KETONES UA: NEGATIVE
Nitrite, UA: POSITIVE
SPEC GRAV UA: 1.015
Urobilinogen, UA: 0.2
pH, UA: 5

## 2014-03-11 LAB — TSH: TSH: 19.511 u[IU]/mL — ABNORMAL HIGH (ref 0.350–4.500)

## 2014-03-11 MED ORDER — CYCLOBENZAPRINE HCL 5 MG PO TABS: ORAL_TABLET | ORAL | Status: DC

## 2014-03-11 MED ORDER — TRAMADOL HCL 50 MG PO TABS: 50.0000 mg | ORAL_TABLET | Freq: Four times a day (QID) | ORAL | Status: DC | PRN

## 2014-03-11 MED ORDER — TRAZODONE HCL 100 MG PO TABS: 100.0000 mg | ORAL_TABLET | Freq: Every day | ORAL | Status: DC

## 2014-03-11 MED ORDER — RESTORE BARRIER CREA: TOPICAL_CREAM | Status: DC

## 2014-03-11 NOTE — Progress Notes (Signed)
HPI The patient is a 43 y.o. female with a history of hypothyroidism, bipolar disorder, prior DVT, presenting for a follow-up visit for back/hip pain.  The patient notes unprotected sexual intercourse 3 days ago.  Since that time, the patient notes dysuria, without nausea, vomiting, fevers.  She notes stable low-back/flank pain.  The patient also requests screening for STD's.  The patient has a history of hypothyroidism.  Since increasing her synthroid dose, she notes no heat/cold intolerance, diarrhea/constipation, hair/nail changes.  The patient continues to note low back/R flank pain.  The patient had a fall in 09/2013, precipitated by an episode of dizziness, falling on her back in her bathroom.  MR lumbar spine 09/2013 showed L1 compression fracture.  She underwent L1 vertebroplasty 09/2013.  Since that time, she notes severe low back, R flank, and R groin sharp pain.  She has been taking Tramadol (5-6/day) and flexeril (3/day) for this.  The patient notes no change in pain, and no additional falls since that time.  She has no history of other bone fracture, but notes that her mother had a "weak spine".  The patient has been taking Trazodone, 100 mg qhs for sleep, which she notes has helped.  [facility], and takes Abilify and bupropion.  The patient's next appointment is later this month.  The patient asks for a refill of this medication, stating that [facility] asked us to refill this medication (as opposed [facility]).  The patient has completed her taper of florinef.  ROS: General: no fevers, chills, changes in weight, changes in appetite Skin: no rash HEENT: no blurry vision, hearing changes, sore throat Pulm: no dyspnea, coughing, wheezing CV: no chest pain, palpitations, shortness of breath Abd: no abdominal pain, nausea/vomiting, diarrhea/constipation GU: see HPI Ext: no arthralgias, myalgias Neuro: no weakness, numbness, or  tingling  Filed Vitals:   03/11/14 0855  BP: 126/86  Pulse: 79  Temp: 97.5 F (36.4 C)    PEX General: alert, cooperative, and in no apparent distress HEENT: pupils equal round and reactive to light, vision grossly intact, oropharynx clear and non-erythematous  Neck: supple Lungs: clear to ascultation bilaterally, normal work of respiration, no wheezes, rales, ronchi Heart: regular rate and rhythm, no murmurs, gallops, or rubs Abdomen: soft, mild suprapubic ttp, non-distended, normal bowel sounds, no guarding/rebound Back: Lumbar spine ttp, R > L flank ttp Extremities: no cyanosis, clubbing, or edema, straight leg raise test produced pain at about 30 degrees bilaterally Neurologic: alert & oriented X3, cranial nerves II-XII intact, strength grossly intact, sensation intact to light touch  Current Outpatient Prescriptions on File Prior to Visit  Medication Sig Dispense Refill  . acetaminophen (TYLENOL) 500 MG tablet Take 1,000 mg by mouth every 6 (six) hours as needed for headache.      . ARIPiprazole (ABILIFY) 15 MG tablet Take 15 mg by mouth daily.      Marland Kitchen. buPROPion (WELLBUTRIN XL) 150 MG 24 hr tablet Take 150 mg by mouth daily.      . clonazePAM (KLONOPIN) 1 MG tablet Take 1 mg by mouth 2 (two) times daily.      . cyclobenzaprine (FLEXERIL) 5 MG tablet TAKE ONE TABLET BY MOUTH EVERY 8 HOURS AS NEEDED FOR MUSCLE SPASMS  60 tablet  0  . gabapentin (NEURONTIN) 300 MG capsule Take 1 capsule (300 mg total) by mouth 3 (three) times daily.  90 capsule  5  . levothyroxine (SYNTHROID, LEVOTHROID) 88 MCG tablet Take  1 tablet (88 mcg total) by mouth daily before breakfast.  30 tablet  11  . ondansetron (ZOFRAN-ODT) 4 MG disintegrating tablet Take 4 mg by mouth daily as needed for nausea or vomiting.      . pantoprazole (PROTONIX) 40 MG tablet Take 1 tablet (40 mg total) by mouth daily.  30 tablet  11  . simvastatin (ZOCOR) 40 MG tablet Take 1 tablet (40 mg total) by mouth every morning.  30  tablet  11  . traMADol (ULTRAM) 50 MG tablet Take 1 tablet (50 mg total) by mouth every 6 (six) hours as needed (pain).  90 tablet  0  . traZODone (DESYREL) 100 MG tablet Take 1 tablet (100 mg total) by mouth at bedtime.  30 tablet  5   No current facility-administered medications on file prior to visit.    Assessment/Plan

## 2014-03-11 NOTE — Assessment & Plan Note (Addendum)
The patient notes compliance with synthroid.  Last TSH > 40, synthroid dose increased to 88 mcg at that time. -check TSH today, adjust synthroid as needed  Addendum 5/13: TSH has come down to 19, from >40.  We will increase Synthroid to 100 mcg, from 88.

## 2014-03-11 NOTE — Patient Instructions (Addendum)
General Instructions: We are sending tests for a urinary tract infection or STD's.  If any of the results are positive, we will contact you for further treatment.  We are checking your Thyroid level today, and will notify you if your synthroid dose needs to be adjusted.  We are sending you for x-rays of your right hip, due to your continued low back and groin pain. -we are referring you to a pain clinic -we have refilled Flexeril (no more than 3 tabs/day) and Tramadol (no more than 5 tabs/day).  If you run out of these medications early, we will not provide early refills.  We have refilled Trazodone, and we have had you sign a release of records form for [facility].  Please return for a follow-up visit in 2-3 months.  Please bring your medicines with you each time you come to clinic.  Medicines may include prescription medications, over-the-counter medications, herbal remedies, eye drops, vitamins, or other pills.   Progress Toward Treatment Goals:  Treatment Goal 03/11/2014  Stop smoking smoking the same amount  Prevent falls improved    Self Care Goals & Plans:  Self Care Goal 03/11/2014  Manage my medications take my medicines as prescribed; bring my medications to every visit; refill my medications on time  Monitor my health -  Eat healthy foods drink diet soda or water instead of juice or soda; eat more vegetables; eat foods that are low in salt; eat baked foods instead of fried foods; eat fruit for snacks and desserts  Be physically active -  Prevent falls -    No flowsheet data found.   Care Management & Community Referrals:  Referral 03/11/2014  Referrals made for care management support none needed  Referrals made to community resources -

## 2014-03-11 NOTE — Assessment & Plan Note (Signed)
The patient continues to note low back, right side/flank, and right hip pain.  She notes no difficulty with ambulation.  Straight leg raise positive bilaterally, though pt only notes groin pain in R hip.  DG lumbar spine last month showed no new abnormalities.  The source of the patient's pain is likely secondary to her L1 compression fracture and vertebroplasty. -given R hip pain, will check R hip x-ray -check DEXA, given compression fracture -refilled Tramadol and Flexeril.  Pt has been getting early refills from pharmacy.  Wrote paper prescriptions to be filled exactly 1 month apart for the next 3 months. -referral sent to Pain Clinic, for assistance with management of this patient's pain

## 2014-03-11 NOTE — Assessment & Plan Note (Signed)
The patient notes a history of insomnia, but has had relief of symptoms with Trazodone 100 mg qhs.  My only concern is the number of psychiatric medications the patient is currently taking, and unseen medication interactions if the patient's psychiatric [facility] are [facility] -continue Trazodone -pt [facility], so we can communicate about medication changes

## 2014-03-11 NOTE — Assessment & Plan Note (Addendum)
The patient notes unprotected sex (though no known disease exposure), with dysuria since that time. -check UA, urine culture for UTI -screen with GC/chlamydia, HIV, RPR.  Given recent exposure, consider re-testing in 3 months if patient develops concerning symptoms -counseled patient on using condoms  Addendum 5/13: UA showed 21-50 WBC's, with positive Nitrites and LE.  As discussed on prior visits, the patient notes allergies/intolerances to sulfa, PCN, and cipro, so our options for treatment are limited.  We will treat empirically with Levaquin, 250 mg daily x3 days (uncomplicated UTI)

## 2014-03-12 ENCOUNTER — Telehealth: Payer: Self-pay | Admitting: Internal Medicine

## 2014-03-12 ENCOUNTER — Encounter: Payer: Medicaid Other | Admitting: Internal Medicine

## 2014-03-12 LAB — URINALYSIS, MICROSCOPIC ONLY: CRYSTALS: NONE SEEN

## 2014-03-12 LAB — URINALYSIS, ROUTINE W REFLEX MICROSCOPIC
Bilirubin Urine: NEGATIVE
GLUCOSE, UA: NEGATIVE mg/dL
HGB URINE DIPSTICK: NEGATIVE
Ketones, ur: NEGATIVE mg/dL
Nitrite: POSITIVE — AB
PH: 5 (ref 5.0–8.0)
Protein, ur: NEGATIVE mg/dL
Specific Gravity, Urine: 1.021 (ref 1.005–1.030)
Urobilinogen, UA: 1 mg/dL (ref 0.0–1.0)

## 2014-03-12 LAB — URINE CULTURE
Colony Count: NO GROWTH
Organism ID, Bacteria: NO GROWTH

## 2014-03-12 LAB — HIV ANTIBODY (ROUTINE TESTING W REFLEX): HIV: NONREACTIVE

## 2014-03-12 LAB — URINE CYTOLOGY ANCILLARY ONLY
Chlamydia: NEGATIVE
Neisseria Gonorrhea: NEGATIVE

## 2014-03-12 LAB — VITAMIN D 25 HYDROXY (VIT D DEFICIENCY, FRACTURES): Vit D, 25-Hydroxy: 32 ng/mL (ref 30–89)

## 2014-03-12 MED ORDER — LEVOTHYROXINE SODIUM 100 MCG PO TABS: 100.0000 ug | ORAL_TABLET | Freq: Every day | ORAL | Status: DC

## 2014-03-12 MED ORDER — LEVOFLOXACIN 250 MG PO TABS: 250.0000 mg | ORAL_TABLET | Freq: Every day | ORAL | Status: AC

## 2014-03-12 NOTE — Addendum Note (Signed)
Addended by: Linward HeadlandBROWN, Kateria Cutrona K on: 03/12/2014 11:08 AM   Modules accepted: Orders

## 2014-03-12 NOTE — Telephone Encounter (Signed)
  INTERNAL MEDICINE RESIDENCY PROGRAM After-Hours Telephone Call    Reason for call:   I placed an outgoing call to [name] at 7:30 PM regarding her right hip X ray result. She was evaluated at the clinic on 03/11/14 and a right hip X ray was ordered. She would like to know that x ray result.   Her right hip xray is negative W/O acute abnormality. I discussed with patient about her x ray result. She is encouraged to call the clinic in am if any additional questions. She agrees with the plan.    Dede QueryNa Jadalee Westcott, MD   03/12/2014, 7:41 PM

## 2014-03-12 NOTE — Progress Notes (Signed)
Case discussed with Dr. Manson PasseyBrown at the time of the visit.  We reviewed the resident's history and exam and pertinent patient test results.  I agree with the assessment, diagnosis, and plan of care documented in the resident's note, with the following additional comments.  Follow-up STD screening will be needed even if asymptomatic to definitively rule out STD given concern about possible recent exposure.

## 2014-03-22 ENCOUNTER — Encounter (HOSPITAL_COMMUNITY): Payer: Self-pay | Admitting: Emergency Medicine

## 2014-03-22 ENCOUNTER — Emergency Department (HOSPITAL_COMMUNITY): Payer: Medicaid Other

## 2014-03-22 ENCOUNTER — Emergency Department (HOSPITAL_COMMUNITY)
Admission: EM | Admit: 2014-03-22 | Discharge: 2014-03-22 | Disposition: A | Discharge status: Home / Self Care | Payer: Medicaid Other | Attending: Emergency Medicine | Admitting: Emergency Medicine

## 2014-03-22 DIAGNOSIS — Y9289 Other specified places as the place of occurrence of the external cause: Secondary | ICD-10-CM | POA: Insufficient documentation

## 2014-03-22 DIAGNOSIS — S62639A Displaced fracture of distal phalanx of unspecified finger, initial encounter for closed fracture: Secondary | ICD-10-CM | POA: Insufficient documentation

## 2014-03-22 DIAGNOSIS — S62606A Fracture of unspecified phalanx of right little finger, initial encounter for closed fracture: Secondary | ICD-10-CM

## 2014-03-22 DIAGNOSIS — F319 Bipolar disorder, unspecified: Secondary | ICD-10-CM | POA: Insufficient documentation

## 2014-03-22 DIAGNOSIS — Z8742 Personal history of other diseases of the female genital tract: Secondary | ICD-10-CM | POA: Insufficient documentation

## 2014-03-22 DIAGNOSIS — G43909 Migraine, unspecified, not intractable, without status migrainosus: Secondary | ICD-10-CM | POA: Insufficient documentation

## 2014-03-22 DIAGNOSIS — F17200 Nicotine dependence, unspecified, uncomplicated: Secondary | ICD-10-CM | POA: Insufficient documentation

## 2014-03-22 DIAGNOSIS — W010XXA Fall on same level from slipping, tripping and stumbling without subsequent striking against object, initial encounter: Secondary | ICD-10-CM | POA: Insufficient documentation

## 2014-03-22 DIAGNOSIS — Z79899 Other long term (current) drug therapy: Secondary | ICD-10-CM | POA: Insufficient documentation

## 2014-03-22 DIAGNOSIS — Z86718 Personal history of other venous thrombosis and embolism: Secondary | ICD-10-CM | POA: Insufficient documentation

## 2014-03-22 DIAGNOSIS — Y9301 Activity, walking, marching and hiking: Secondary | ICD-10-CM | POA: Insufficient documentation

## 2014-03-22 DIAGNOSIS — Z8619 Personal history of other infectious and parasitic diseases: Secondary | ICD-10-CM | POA: Insufficient documentation

## 2014-03-22 DIAGNOSIS — E039 Hypothyroidism, unspecified: Secondary | ICD-10-CM | POA: Insufficient documentation

## 2014-03-22 MED ORDER — HYDROCODONE-ACETAMINOPHEN 5-325 MG PO TABS
1.0000 | ORAL_TABLET | Freq: Once | ORAL | Status: AC
  Administered 2014-03-22: 1 via ORAL
  Filled 2014-03-22: qty 1

## 2014-03-22 MED ORDER — HYDROCODONE-ACETAMINOPHEN 5-325 MG PO TABS: 2.0000 | ORAL_TABLET | ORAL | Status: DC | PRN

## 2014-03-22 NOTE — ED Provider Notes (Signed)
Medical screening examination/treatment/procedure(s) were performed by non-physician practitioner and as supervising physician I was immediately available for consultation/collaboration.    Mantaj Chamberlin L Toluwani Ruder, MD 03/22/14 1401 

## 2014-03-22 NOTE — ED Notes (Signed)
She tripped and fell onto the concrete this am. She has an abrasion to chin with no active bleeding and R hand/pinky pain. She denies LOC. She is A&Ox4.

## 2014-03-22 NOTE — ED Provider Notes (Signed)
CSN: [id number]     Arrival date & time 03/22/14  1240 History  This chart was scribed for Fayrene Helper, PA-C, working with Nelia Shi, MD by Blanchard Kelch, ED Scribe. This patient was seen in room TR07C/TR07C and the patient's care was started at 1:39 PM.     Chief Complaint  Patient presents with  . Fall     Patient is a 43 y.o. female presenting with fall. The history is provided by the patient. No language interpreter was used.  Fall    HPI Comments: [name] is a 43 y.o. female who presents to the Emergency Department due to a fall that occurred about two hours ago. She was walking and missed a step, falling forward and landed on her right hand. She denies losing consciousness. She is complaining of constant pain to her right hand, worst in the pinky finger onset immediately after the fall. She has decreased ROM in the finger secondary to pain. She also sustained an abrasion to her cheek due to the fall. She reports a history of chronic back pain that she takes Ultram for.    Past Medical History  Diagnosis Date  . Deep venous thrombosis of leg 2012    completed year of coumadin   . Hx of measles   . Migraines   . Monilia infection 12/31/10  . Vaginal atrophy 03/04/11  . Dyspareunia 03/04/11  . Bipolar 1 disorder     Followed by Mental Health.  All psychiatric medications prescribed by Mental Health.  Stable for many years.    . Hypothyroidism     Hypothyroidism since 8th grade.  Managed by Dr. Sharl Ma, Endocrinology.  On synthroid.  No prior thyroid surgery/ablation.    Past Surgical History  Procedure Laterality Date  . 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 Cesarean section  1998  . Tonsillectomy    . Wisdom tooth extraction    . Cholecystectomy    . Left elbow    . Abdominal hysterectomy  2000  . Appendectomy     Family History  Problem Relation Age of Onset  . Cancer Maternal Aunt    History  Substance Use Topics  . Smoking status: Current Every  Day Smoker -- 0.03 packs/day    Types: Cigarettes    Last Attempt to Quit: 10/22/2012  . Smokeless tobacco: Never Used     Comment: 3 per day  . Alcohol Use: No   OB History   Grav Para Term Preterm Abortions TAB SAB Ect Mult Living   1 1 1       1      Review of Systems  Constitutional: Negative for fever and chills.  Musculoskeletal: Positive for myalgias.  Skin: Positive for wound.      Allergies  Codeine; Meloxicam; Morphine and related; Naproxen; and Sulfonamide derivatives  Home Medications   Prior to Admission medications   Medication Sig Start Date End Date Taking? Authorizing Provider  acetaminophen (TYLENOL) 500 MG tablet Take 1,000 mg by mouth every 6 (six) hours as needed for headache.    Historical Provider, MD  ARIPiprazole (ABILIFY) 15 MG tablet Take 15 mg by mouth daily.    Historical Provider, MD  buPROPion (WELLBUTRIN XL) 150 MG 24 hr tablet Take 150 mg by mouth daily.    Historical Provider, MD  clonazePAM (KLONOPIN) 1 MG tablet Take 1 mg by mouth 2 (two) times daily.    Historical Provider, MD  cyclobenzaprine (FLEXERIL)  5 MG tablet TAKE ONE TABLET BY MOUTH EVERY 8 HOURS AS NEEDED FOR MUSCLE SPASMS 03/11/14   Linward Headland, MD  gabapentin (NEURONTIN) 300 MG capsule Take 1 capsule (300 mg total) by mouth 3 (three) times daily. 11/12/13   Linward Headland, MD  levothyroxine (SYNTHROID, LEVOTHROID) 100 MCG tablet Take 1 tablet (100 mcg total) by mouth daily before breakfast. 03/12/14   Linward Headland, MD  ondansetron (ZOFRAN-ODT) 4 MG disintegrating tablet Take 4 mg by mouth daily as needed for nausea or vomiting.    Historical Provider, MD  pantoprazole (PROTONIX) 40 MG tablet Take 1 tablet (40 mg total) by mouth daily. 04/04/13 04/04/14  Linward Headland, MD  protective barrier (RESTORE) CREA Apply to affected area of buttocks once daily 03/11/14   Linward Headland, MD  simvastatin (ZOCOR) 40 MG tablet Take 1 tablet (40 mg total) by mouth every morning. 01/01/14   Linward Headland, MD   traMADol (ULTRAM) 50 MG tablet Take 1 tablet (50 mg total) by mouth every 6 (six) hours as needed (pain). 03/11/14   Linward Headland, MD  traZODone (DESYREL) 100 MG tablet Take 1 tablet (100 mg total) by mouth at bedtime. 03/11/14   Linward Headland, MD   Triage Vitals: BP 143/98  Pulse 86  Temp(Src) 97.7 F (36.5 C) (Oral)  Resp 18  Ht 5\' 5"  (1.651 m)  Wt 211 lb 2 oz (95.766 kg)  BMI 35.13 kg/m2  SpO2 97%  Physical Exam  Nursing note and vitals reviewed. Constitutional: She is oriented to person, place, and time. She appears well-developed and well-nourished. No distress.  HENT:  Head: Normocephalic and atraumatic.  Eyes: EOM are normal.  Neck: Neck supple. No tracheal deviation present.  Cardiovascular: Normal rate.   Pulmonary/Chest: Effort normal. No respiratory distress.  Musculoskeletal: Normal range of motion.  Pain throughout 5th finger with most significant pain at proximal phalanx. Decreased flexion and extension to the finger secondary to pain. No pain to anatomical snuff box or wrist. No obvious deformity. Brisk cap refill.   Neurological: She is alert and oriented to person, place, and time.  Skin: Skin is warm and dry.  Psychiatric: She has a normal mood and affect. Her behavior is normal.    ED Course  Procedures (including critical care time)  DIAGNOSTIC STUDIES: Oxygen Saturation is 97% on room air, adequate by my interpretation.    COORDINATION OF CARE: 1:44 PM -Will place 5th right finger in splint and discharge with pain medication. Will provide resource materials to follow up if needed. Patient verbalizes understanding and agrees with treatment plan.    Labs Review Labs Reviewed - No data to display  Imaging Review Dg Hand Complete Right  03/22/2014   CLINICAL DATA:  Right hand pain after fall.  EXAM: RIGHT HAND - COMPLETE 3+ VIEW  COMPARISON:  None.  FINDINGS: Mildly displaced fracture is seen involving the proximal base of the fifth proximal phalanx. Joint  spaces are intact.  IMPRESSION: Mildly displaced fifth proximal phalangeal fracture.   Electronically Signed   By: Roque Lias M.D.   On: 03/22/2014 13:26     EKG Interpretation None      MDM   Final diagnoses:  Closed fracture of phalanx of fifth finger of right hand    BP 143/98  Pulse 86  Temp(Src) 97.7 F (36.5 C) (Oral)  Resp 18  Ht 5\' 5"  (1.651 m)  Wt 211 lb 2 oz (95.766 kg)  BMI 35.13  kg/m2  SpO2 97%  I have reviewed nursing notes and vital signs. I personally reviewed the imaging tests through PACS system  I reviewed available ER/hospitalization records thought the EMR   I personally performed the services described in this documentation, which was scribed in my presence. The recorded information has been reviewed and is accurate.     Fayrene HelperBowie Aidynn Krenn, PA-C 03/22/14 1347

## 2014-03-22 NOTE — Discharge Instructions (Signed)
Finger Fracture (Phalangeal) A broken bone of the finger (phalangealfracture) is a common injury for athletes. A single injury (trauma) is likely to fracture multiple bones on the same or different fingers. SYMPTOMS   Severe pain, at the time of injury.  Pain, tenderness, swelling, and later bruising of the finger and then the hand.  Visible deformity, if the fracture is complete and the bone fragments separate enough to distort the normal shape.  Numbness or coldness from swelling in the finger, causing pressure on blood vessels or nerves (uncommon). CAUSES  Direct or indirect injury (trauma) to the finger.  RISK INCREASES WITH:   Contact sports (football, rugby) or other sports where injury to the hand is likely (soccer, baseball, basketball).  Sports that require hitting (boxing, martial arts).  History of bone or joint disease, such as osteoporosis, or previous bone restraint.  Poor hand strength and flexibility. PREVENTION   For contact sports, wear appropriate and properly fitted protective equipment for the hand.  Learn and use proper technique when hitting, punching, or landing after a fall.  If you had a previous finger injury or hand restraint, use tape or padding to protect the finger when playing sports where finger injury is likely. PROGNOSIS  With proper treatment and normal alignment of the bones, healing can usually be expected in 4 to 6 weeks. Sometimes, surgery is needed.  RELATED COMPLICATIONS   Fracture does not heal (nonunion).  Bone heals in wrong position (malunion).  Chronic pain, stiffness, or swelling of the hand.  Excessive bleeding, causing pressure on nerves and blood vessels.  Unstable or arthritic joint, following repeated injury or delayed treatment.  Hindrance of normal growth in children.  Infection in skin broken over the fracture (open fracture) or at the incision or pin sites from surgery.  Shortening of injured bones.  Bony bumps  or loss of shape of the fingers.  Arthritic or stiff finger joint, if the fracture reaches the joint. TREATMENT  If the bones are properly aligned, treatment involves ice and medicine to reduce pain and inflammation. Then, the finger is restrained for 4 or more weeks, to allow for healing. If the fracture is out of alignment (displaced), involves more than one bone, or involves a joint, surgery is usually advised. Surgery often involves placing removable pins, screws, and sometimes plates, to hold the bones in proper alignment. After restraint (with or without surgery), stretching and strengthening exercises are needed. Exercises may be completed at home or with a therapist. For certain sports, wearing a splint or having the finger taped during future activity is advised.  MEDICATION   If pain medicine is needed, nonsteroidal anti-inflammatory medicines (aspirin and ibuprofen), or other minor pain relievers (acetaminophen), are often advised.  Do not take pain medicine for 7 days before surgery.  Prescription pain relievers are usually prescribed only after surgery. Use only as directed and only as much as you need. COLD THERAPY   Cold treatment (icing) relieves pain and reduces inflammation. Cold treatment should be applied for 10 to 15 minutes every 2 to 3 hours, and immediately after activity that aggravates your symptoms. Use ice packs or an ice massage. SEEK MEDICAL CARE IF:   Pain, tenderness, or swelling gets worse, despite treatment.  You experience pain, numbness, or coldness in the hand.  Blue, gray, or dark color appears in the fingernails.  Any of the following occur after surgery: fever, increased pain, swelling, redness, drainage of fluids, or bleeding in the affected area.  New,  unexplained symptoms develop. (Drugs used in treatment may produce side effects.) Document Released: 10/17/2005 Document Revised: 01/09/2012 Document Reviewed: 01/29/2009 [facility]ExitCare Patient  Information 2014 [place]Care, [place].

## 2014-03-22 NOTE — Progress Notes (Signed)
Orthopedic Tech Progress Note Patient Details:  [name] 20-Feb-1971 [id number] Finger splint applied Ortho Devices Type of Ortho Device: Finger splint Ortho Device/Splint Location: RUE Ortho Device/Splint Interventions: Application   Asia R Thompson 03/22/2014, 1:55 PM

## 2014-04-03 ENCOUNTER — Telehealth (HOSPITAL_COMMUNITY): Payer: Self-pay | Admitting: Interventional Radiology

## 2014-04-03 NOTE — Telephone Encounter (Signed)
Called pt, left VM that I had faxed over previous treatment reports to Medicaid per her request and to call me if I could help her in any other way. JM

## 2014-04-08 ENCOUNTER — Encounter: Payer: Self-pay | Admitting: Physical Medicine & Rehabilitation

## 2014-04-08 ENCOUNTER — Telehealth: Payer: Self-pay | Admitting: *Deleted

## 2014-04-08 DIAGNOSIS — E039 Hypothyroidism, unspecified: Secondary | ICD-10-CM

## 2014-04-08 NOTE — Telephone Encounter (Signed)
Call from pt - requesting refill on liothyronine daily. Not on current med list. Pt states she takes it everyday. Thanks

## 2014-04-09 MED ORDER — LIOTHYRONINE SODIUM 5 MCG PO TABS: 5.0000 ug | ORAL_TABLET | Freq: Every day | ORAL | Status: DC

## 2014-04-09 NOTE — Telephone Encounter (Signed)
I called the patient's pharmacy ([facility], [address]), who confirm that she last filled liothyronine 5 mcg on 01/11/14, #90 tabs.  This correlates with her report of taking this medication daily.  I did not realize she was still on this medication, which I believe may have been originally prescribed by her endocrinologist.  We have been titrating her synthroid dose.  Since she has been taking this consistently, we will continue it for now, so as to get an accurate TSH reading at her next visit (ie representative of how much thyroid hormone she has been taking).

## 2014-04-10 ENCOUNTER — Other Ambulatory Visit: Payer: Self-pay | Admitting: Internal Medicine

## 2014-04-18 ENCOUNTER — Other Ambulatory Visit: Payer: Self-pay | Admitting: Internal Medicine

## 2014-04-22 ENCOUNTER — Telehealth (HOSPITAL_COMMUNITY): Payer: Self-pay | Admitting: Interventional Radiology

## 2014-04-22 NOTE — Telephone Encounter (Signed)
Pt called and wanted to know if her insurance had made a decision concerning the recent request for an MRI lumbar spine. I told her that I had just received a denial letter from her insurance company. I also told her that on the denial letter Medicaid suggested she needed an x-ray prior to being eligible for an MRI. I encouraged her to call her PCP to have this set up. I told her to call me back and let me know once she has the x-ray scheduled. She states understanding and was in agreement with that plan. JMichaux

## 2014-05-01 ENCOUNTER — Other Ambulatory Visit: Payer: Medicaid Other

## 2014-05-01 ENCOUNTER — Ambulatory Visit: Payer: Medicaid Other

## 2014-05-07 ENCOUNTER — Encounter: Payer: Self-pay | Admitting: Internal Medicine

## 2014-05-07 ENCOUNTER — Ambulatory Visit (INDEPENDENT_AMBULATORY_CARE_PROVIDER_SITE_OTHER): Payer: Medicaid Other | Admitting: Internal Medicine

## 2014-05-07 ENCOUNTER — Ambulatory Visit (HOSPITAL_COMMUNITY)
Admission: RE | Admit: 2014-05-07 | Discharge: 2014-05-07 | Disposition: A | Discharge status: Home / Self Care | Payer: Medicaid Other | Source: Ambulatory Visit | Attending: Internal Medicine | Admitting: Internal Medicine

## 2014-05-07 VITALS — BP 135/95 | HR 81 | Temp 97.2°F | Wt 217.9 lb

## 2014-05-07 DIAGNOSIS — M545 Low back pain, unspecified: Secondary | ICD-10-CM

## 2014-05-07 DIAGNOSIS — G8929 Other chronic pain: Secondary | ICD-10-CM

## 2014-05-07 DIAGNOSIS — E039 Hypothyroidism, unspecified: Secondary | ICD-10-CM

## 2014-05-07 MED ORDER — LEVOTHYROXINE SODIUM 100 MCG PO TABS: 100.0000 ug | ORAL_TABLET | Freq: Every day | ORAL | Status: DC

## 2014-05-07 MED ORDER — CYCLOBENZAPRINE HCL 5 MG PO TABS: 5.0000 mg | ORAL_TABLET | Freq: Three times a day (TID) | ORAL | Status: DC | PRN

## 2014-05-07 NOTE — Assessment & Plan Note (Signed)
Pt's synthroid was previously increased to 100mcg per Dr. Manson PasseyBrown, however pt has still been taking synthroid 88mcg. Instructed pt to take 100mcg and will check repeat TSH in 4-6 weeks.

## 2014-05-07 NOTE — Progress Notes (Signed)
   Subjective:    Patient ID: [name], female    DOB: 02-04-1971, 43 y.o.   MRN: [id number]  HPI Pt is a 43 y/o female w/ PMH of hypothyroidism, hx of DVT, bipolar d/o, and recent L1 vertebral fracture s/p L1 vertebroplasty in December 2014 who presents with unresolved back pain requesting a lower back xray. She states that Dr. Corliss Skainseveshwar, her interventional radiologist, would like an MRI to further investigate source of back pain.  Pt's last xray was in April 2015. She has had persistent back pain since then. She describes 5-6/10 pain in lower back that is non radiating. It interferes with her daily life activities such as lifting and walking, as well as wakes her up in the middle of the night during sleep. She has tried tylenol and back exercises with minimal relief. She takes hydrocodone for recent rt hand pinky fracture which also helps w/ back pain.   Also, during last clinic visit with Dr. Manson PasseyBrown, her synthroid was increased from 88mcg to 100mcg. However pt has still been taking synthroid 88mcg since then.     Review of Systems  Constitutional: Positive for activity change, appetite change and fatigue. Negative for fever and chills.  HENT: Negative.   Eyes: Negative.   Respiratory: Negative.   Cardiovascular: Negative.   Gastrointestinal: Negative.   Endocrine: Positive for cold intolerance.  Genitourinary: Negative.   Musculoskeletal: Positive for back pain.  Allergic/Immunologic: Negative.   Psychiatric/Behavioral: The patient is nervous/anxious.        Objective:   Physical Exam  Constitutional: She appears well-developed and well-nourished. No distress.  HENT:  Head: Normocephalic and atraumatic.  Eyes: Pupils are equal, round, and reactive to light. No scleral icterus.  Cardiovascular: Normal rate, regular rhythm and normal heart sounds.  Exam reveals no gallop and no friction rub.   No murmur heard. Pulmonary/Chest: Effort normal and breath sounds normal. No  respiratory distress. She has no wheezes. She has no rales.  Abdominal: Soft. Bowel sounds are normal. There is no tenderness.  Musculoskeletal: She exhibits tenderness.  Lower back point tenderness over spine and paravertebral area  Skin: Skin is warm and dry. She is not diaphoretic.  Psychiatric: She has a normal mood and affect. Her behavior is normal. Judgment and thought content normal.          Assessment & Plan:  See problem based charting for assessment and plan.

## 2014-05-07 NOTE — Patient Instructions (Signed)
Please make sure to take synthroid 100 mcg, we will send prescription to your pharmacy today. Please schedule a follow up appointment to recheck your thyroid levels in 4-6 weeks.   We will order a lower back xray. If no improvement in back pain we will order a MRI on the next visit. Please make sure to mammogram and bone density test in August.    Back Pain, Adult Back pain is very common. The pain often gets better over time. The cause of back pain is usually not dangerous. Most people can learn to manage their back pain on their own.  HOME CARE   Stay active. Start with short walks on flat ground if you can. Try to walk farther each day.  Do not sit, drive, or stand in one place for more than 30 minutes. Do not stay in bed.  Do not avoid exercise or work. Activity can help your back heal faster.  Be careful when you bend or lift an object. Bend at your knees, keep the object close to you, and do not twist.  Sleep on a firm mattress. Lie on your side, and bend your knees. If you lie on your back, put a pillow under your knees.  Only take medicines as told by your doctor.  Put ice on the injured area.  Put ice in a plastic bag.  Place a towel between your skin and the bag.  Leave the ice on for 15-20 minutes, 03-04 times a day for the first 2 to 3 days. After that, you can switch between ice and heat packs.  Ask your doctor about back exercises or massage.  Avoid feeling anxious or stressed. Find good ways to deal with stress, such as exercise. GET HELP RIGHT AWAY IF:   Your pain does not go away with rest or medicine.  Your pain does not go away in 1 week.  You have new problems.  You do not feel well.  The pain spreads into your legs.  You cannot control when you poop (bowel movement) or pee (urinate).  Your arms or legs feel weak or lose feeling (numbness).  You feel sick to your stomach (nauseous) or throw up (vomit).  You have belly (abdominal) pain.  You feel  like you may pass out (faint). MAKE SURE YOU:   Understand these instructions.  Will watch your condition.  Will get help right away if you are not doing well or get worse. Document Released: 04/04/2008 Document Revised: 01/09/2012 Document Reviewed: 03/07/2011 [facility] Patient Information 2015 [place]ExitCare, [place]LLC. This information is not intended to replace advice given to you by your health care provider. Make sure you discuss any questions you have with your health care provider.

## 2014-05-07 NOTE — Progress Notes (Signed)
INTERNAL MEDICINE TEACHING ATTENDING ADDENDUM - Wendy LagosNischal Prentice Sackrider, MD: I personally saw and evaluated [name] in this clinic visit in conjunction with the resident, Dr. Danella Chase. I have discussed patient's plan of care with medical resident during this visit. I have confirmed the physical exam findings and have read and agree with the clinic note including the plan with the following addition: - on exam- mild tenderness over right pinky. Lungs - CTA b/l, Cardio- RRR, normal heart sounds - Increase synthroid to 100 micrograms and check TSH in 4-6 weeks - check lumbar x ray today - Outpatient follow up with IR

## 2014-05-07 NOTE — Assessment & Plan Note (Signed)
Pt has persistent lower back pain since December s/p L1 vertebroplasty, will order repeat lower back xray given persistent pain. Refilled rx for flexeril. Pt to f/u with Dr. Corliss Skainseveshwar with results.    If no improvement of pain by next visit, will consider order a MRI.

## 2014-05-19 ENCOUNTER — Other Ambulatory Visit: Payer: Self-pay | Admitting: *Deleted

## 2014-05-19 NOTE — Telephone Encounter (Signed)
I did not prescribe this prescription. If she is having pain she will need schedule an appointment to be seen. Thank you!  Gara Kroneriana Truong, MD PGY1

## 2014-05-19 NOTE — Telephone Encounter (Signed)
Possibly change directions to 1 to 2 every 6 or 8 hrs, #60 or #90- pt states she was told at last visit she could have some, states her back has really been bothering her.

## 2014-05-19 NOTE — Telephone Encounter (Signed)
Thank you :)

## 2014-05-19 NOTE — Telephone Encounter (Signed)
Pt called and requested refill of hydrocodone stated That she was told at last visit that if  She needed a refill all she needed to call and ask, spoke w/ dr Heide Sparknarendra and he states she will need to come in for an appt specifically to discuss script for hydrocodone. Pt was informed and quite disappointed but finally agreed to an appt stating she could come wed only, appt for 1315 dr Shirlee Latchmclean wed 7/22.

## 2014-05-21 ENCOUNTER — Ambulatory Visit (INDEPENDENT_AMBULATORY_CARE_PROVIDER_SITE_OTHER): Payer: Medicaid Other | Admitting: Internal Medicine

## 2014-05-21 ENCOUNTER — Encounter: Payer: Self-pay | Admitting: Internal Medicine

## 2014-05-21 VITALS — BP 136/89 | HR 85 | Temp 97.7°F | Ht 65.0 in | Wt 214.1 lb

## 2014-05-21 DIAGNOSIS — M545 Low back pain, unspecified: Secondary | ICD-10-CM

## 2014-05-21 DIAGNOSIS — H8110 Benign paroxysmal vertigo, unspecified ear: Secondary | ICD-10-CM | POA: Insufficient documentation

## 2014-05-21 DIAGNOSIS — G8929 Other chronic pain: Secondary | ICD-10-CM

## 2014-05-21 DIAGNOSIS — R42 Dizziness and giddiness: Secondary | ICD-10-CM

## 2014-05-21 HISTORY — DX: Benign paroxysmal vertigo, unspecified ear: H81.10

## 2014-05-21 MED ORDER — MECLIZINE HCL 25 MG PO TABS: 25.0000 mg | ORAL_TABLET | Freq: Two times a day (BID) | ORAL | Status: DC | PRN

## 2014-05-21 MED ORDER — TRAMADOL HCL 50 MG PO TABS: 50.0000 mg | ORAL_TABLET | Freq: Four times a day (QID) | ORAL | Status: DC | PRN

## 2014-05-21 NOTE — Progress Notes (Signed)
   Subjective:    Patient ID: [name], female    DOB: June 04, 1971, 43 y.o.   MRN: [id number]  HPI Comments: 43 y.o PMH chronic pain, bipolar, GERD, hypothyroidism, orthostatic hypotension  She presents for  1. C/o back pain-previous imaged Xray 05/07/14 with post op changes (surgery 09/2013 with Dr. Erlene Senterseveswar) negative findings otherwise with previous MRI with anterior wedge compression fracture L1 superior end plate with loss of height and anterior mild edema s/p trauma.  She presents for medication refill narcotic. Tramadol has previously helped back pain.  She has f/u with pain clinic 05/23/14 at 1 PM but did not remember the appt.  She has chronic back pain that seems to be getting worse 8/10 daily w/o radiation lower back.  Pain feels like more than a toothache, dull. She denies pain being stabbing/sharp.  Back pain limits ROM with bending.   2.She c/o sensation of the room spinning with position changes.  orthostatics neg lying 135/96 HR 74, sitting 139/97 HR 81, standing 135/94 HR 86       Review of Systems  Musculoskeletal: Positive for back pain.  Neurological:       +vertigo       Objective:   Physical Exam  Nursing note and vitals reviewed. Constitutional: She is oriented to person, place, and time. Vital signs are normal. She appears well-developed and well-nourished. She is cooperative. No distress.  HENT:  Head: Normocephalic and atraumatic.  Right Ear: Hearing, tympanic membrane, external ear and ear canal normal.  Left Ear: Hearing, tympanic membrane, external ear and ear canal normal.  Mouth/Throat: Oropharynx is clear and moist and mucous membranes are normal. She has dentures. No oropharyngeal exudate.  Eyes: Conjunctivae are normal. Right eye exhibits no discharge. Left eye exhibits no discharge. No scleral icterus.  Cardiovascular: Normal rate, regular rhythm, S1 normal, S2 normal and normal heart sounds.   No murmur heard. Pulmonary/Chest: Effort normal and  breath sounds normal. No respiratory distress. She has no wheezes.  Musculoskeletal:       Arms: Neurological: She is alert and oriented to person, place, and time. Gait normal.  CN 2-12 grossly intact   Skin: Skin is warm, dry and intact. No rash noted. She is not diaphoretic.  Psychiatric: She has a normal mood and affect. Her speech is normal and behavior is normal. Judgment and thought content normal. Cognition and memory are normal.          Assessment & Plan:  F/u 06/11/14 with PCP

## 2014-05-21 NOTE — Assessment & Plan Note (Signed)
Will try Meclizine 25 mg bid prn. Ddx could be BPPV. No evidence of infection in ears  If not improved with Meclizine consider vestibular rehab

## 2014-05-21 NOTE — Patient Instructions (Signed)
General Instructions: Please get an MRI of your lumbar back  Follow up with Dr. Wynn Banker 05/23/14 at 1 PM  Follow up with your PCP in 06/11/14 as scheduled  Pick up Tramadol x 1 week and let pain clinic know you are taking that medication Take care    Treatment Goals:  Goals (1 Years of Data) as of 05/21/14   None      Progress Toward Treatment Goals:  Treatment Goal 03/11/2014  Stop smoking smoking the same amount  Prevent falls improved    Self Care Goals & Plans:  Self Care Goal 05/21/2014  Manage my medications take my medicines as prescribed; bring my medications to every visit; refill my medications on time  Monitor my health keep track of my blood pressure  Eat healthy foods drink diet soda or water instead of juice or soda; eat more vegetables; eat foods that are low in salt; eat baked foods instead of fried foods; eat fruit for snacks and desserts; eat smaller portions  Be physically active -  Prevent falls -  Meeting treatment goals maintain the current self-care plan    No flowsheet data found.   Care Management & Community Referrals:  Referral 05/21/2014  Referrals made for care management support -  Referrals made to community resources none     Back Pain, Adult Low back pain is very common. About 1 in 5 people have back pain.The cause of low back pain is rarely dangerous. The pain often gets better over time.About half of people with a sudden onset of back pain feel better in just 2 weeks. About 8 in 10 people feel better by 6 weeks.  CAUSES Some common causes of back pain include:  Strain of the muscles or ligaments supporting the spine.  Wear and tear (degeneration) of the spinal discs.  Arthritis.  Direct injury to the back. DIAGNOSIS Most of the time, the direct cause of low back pain is not known.However, back pain can be treated effectively even when the exact cause of the pain is unknown.Answering your caregiver's questions about your  overall health and symptoms is one of the most accurate ways to make sure the cause of your pain is not dangerous. If your caregiver needs more information, he or she may order lab work or imaging tests (X-rays or MRIs).However, even if imaging tests show changes in your back, this usually does not require surgery. HOME CARE INSTRUCTIONS For many people, back pain returns.Since low back pain is rarely dangerous, it is often a condition that people can learn to [facility] their own.   Remain active. It is stressful on the back to sit or stand in one place. Do not sit, drive, or stand in one place for more than 30 minutes at a time. Take short walks on level surfaces as soon as pain allows.Try to increase the length of time you walk each day.  Do not stay in bed.Resting more than 1 or 2 days can delay your recovery.  Do not avoid exercise or work.Your body is made to move.It is not dangerous to be active, even though your back may hurt.Your back will likely heal faster if you return to being active before your pain is gone.  Pay attention to your body when you bend and lift. Many people have less discomfortwhen lifting if they bend their knees, keep the load close to their bodies,and avoid twisting. Often, the most comfortable positions are those that put less stress on your recovering back.  Find a comfortable position to sleep. Use a firm mattress and lie on your side with your knees slightly bent. If you lie on your back, put a pillow under your knees.  Only take over-the-counter or prescription medicines as directed by your caregiver. Over-the-counter medicines to reduce pain and inflammation are often the most helpful.Your caregiver may prescribe muscle relaxant drugs.These medicines help dull your pain so you can more quickly return to your normal activities and healthy exercise.  Put ice on the injured area.  Put ice in a plastic bag.  Place a towel between your skin and the  bag.  Leave the ice on for 15-20 minutes, 03-04 times a day for the first 2 to 3 days. After that, ice and heat may be alternated to reduce pain and spasms.  Ask your caregiver about trying back exercises and gentle massage. This may be of some benefit.  Avoid feeling anxious or stressed.Stress increases muscle tension and can worsen back pain.It is important to recognize when you are anxious or stressed and learn ways to manage it.Exercise is a great option. SEEK MEDICAL CARE IF:  You have pain that is not relieved with rest or medicine.  You have pain that does not improve in 1 week.  You have new symptoms.  You are generally not feeling well. SEEK IMMEDIATE MEDICAL CARE IF:   You have pain that radiates from your back into your legs.  You develop new bowel or bladder control problems.  You have unusual weakness or numbness in your arms or legs.  You develop nausea or vomiting.  You develop abdominal pain.  You feel faint. Document Released: 10/17/2005 Document Revised: 04/17/2012 Document Reviewed: 03/07/2011 [facility] Patient Information 2015 [employer], [place]. This information is not intended to replace advice given to you by your health care provider. Make sure you discuss any questions you have with your health care provider.

## 2014-05-21 NOTE — Assessment & Plan Note (Signed)
Likely related to previous compression fracture s/p surgery. Recent Xray neg Will do lumbar MRI and if concerning will refer back to Dr. Erlene Senterseveswar Rx Tramadol 50 mg q6 prn x 1 week supply  Pt to f/u with pain clinic 05/23/14  F/u with PCP 06/11/14

## 2014-05-22 NOTE — Progress Notes (Signed)
INTERNAL MEDICINE TEACHING ATTENDING ADDENDUM - Kynlie Jane, MD: I reviewed and discussed at the time of visit with the resident Dr. McLean, the patient's medical history, physical examination, diagnosis and results of pertinent tests and treatment and I agree with the patient's care as documented.  

## 2014-05-23 ENCOUNTER — Encounter: Payer: Self-pay | Admitting: Physical Medicine & Rehabilitation

## 2014-05-23 ENCOUNTER — Ambulatory Visit (HOSPITAL_BASED_OUTPATIENT_CLINIC_OR_DEPARTMENT_OTHER): Payer: Medicaid Other | Admitting: Physical Medicine & Rehabilitation

## 2014-05-23 ENCOUNTER — Encounter: Payer: Medicaid Other | Attending: Physical Medicine & Rehabilitation

## 2014-05-23 VITALS — BP 144/90 | HR 84 | Resp 14 | Ht 65.0 in | Wt 213.0 lb

## 2014-05-23 DIAGNOSIS — E039 Hypothyroidism, unspecified: Secondary | ICD-10-CM | POA: Insufficient documentation

## 2014-05-23 DIAGNOSIS — F319 Bipolar disorder, unspecified: Secondary | ICD-10-CM | POA: Diagnosis not present

## 2014-05-23 DIAGNOSIS — M545 Low back pain, unspecified: Secondary | ICD-10-CM | POA: Diagnosis not present

## 2014-05-23 DIAGNOSIS — G8929 Other chronic pain: Secondary | ICD-10-CM | POA: Insufficient documentation

## 2014-05-23 DIAGNOSIS — Z86718 Personal history of other venous thrombosis and embolism: Secondary | ICD-10-CM | POA: Insufficient documentation

## 2014-05-23 DIAGNOSIS — Z79899 Other long term (current) drug therapy: Secondary | ICD-10-CM

## 2014-05-23 DIAGNOSIS — F17200 Nicotine dependence, unspecified, uncomplicated: Secondary | ICD-10-CM | POA: Insufficient documentation

## 2014-05-23 DIAGNOSIS — Z5181 Encounter for therapeutic drug level monitoring: Secondary | ICD-10-CM

## 2014-05-23 DIAGNOSIS — Z96659 Presence of unspecified artificial knee joint: Secondary | ICD-10-CM | POA: Insufficient documentation

## 2014-05-23 DIAGNOSIS — E669 Obesity, unspecified: Secondary | ICD-10-CM | POA: Diagnosis not present

## 2014-05-23 NOTE — Patient Instructions (Signed)
Back Exercises These exercises may help you when beginning to rehabilitate your injury. Your symptoms may resolve with or without further involvement from your physician, physical therapist or athletic trainer. While completing these exercises, remember:   Restoring tissue flexibility helps normal motion to return to the joints. This allows healthier, less painful movement and activity.  An effective stretch should be held for at least 30 seconds.  A stretch should never be painful. You should only feel a gentle lengthening or release in the stretched tissue. STRETCH - Extension, Prone on Elbows   Lie on your stomach on the floor, a bed will be too soft. Place your palms about shoulder width apart and at the height of your head.  Place your elbows under your shoulders. If this is too painful, stack pillows under your chest.  Allow your body to relax so that your hips drop lower and make contact more completely with the floor.  Hold this position for __________ seconds.  Slowly return to lying flat on the floor. Repeat __________ times. Complete this exercise __________ times per day.  RANGE OF MOTION - Extension, Prone Press Ups   Lie on your stomach on the floor, a bed will be too soft. Place your palms about shoulder width apart and at the height of your head.  Keeping your back as relaxed as possible, slowly straighten your elbows while keeping your hips on the floor. You may adjust the placement of your hands to maximize your comfort. As you gain motion, your hands will come more underneath your shoulders.  Hold this position __________ seconds.  Slowly return to lying flat on the floor. Repeat __________ times. Complete this exercise __________ times per day.  RANGE OF MOTION- Quadruped, Neutral Spine   Assume a hands and knees position on a firm surface. Keep your hands under your shoulders and your knees under your hips. You may place padding under your knees for  comfort.  Drop your head and point your tail bone toward the ground below you. This will round out your low back like an angry cat. Hold this position for __________ seconds.  Slowly lift your head and release your tail bone so that your back sags into a large arch, like an old horse.  Hold this position for __________ seconds.  Repeat this until you feel limber in your low back.  Now, find your "sweet spot." This will be the most comfortable position somewhere between the two previous positions. This is your neutral spine. Once you have found this position, tense your stomach muscles to support your low back.  Hold this position for __________ seconds. Repeat __________ times. Complete this exercise __________ times per day.  STRETCH - Flexion, Single Knee to Chest   Lie on a firm bed or floor with both legs extended in front of you.  Keeping one leg in contact with the floor, bring your opposite knee to your chest. Hold your leg in place by either grabbing behind your thigh or at your knee.  Pull until you feel a gentle stretch in your low back. Hold __________ seconds.  Slowly release your grasp and repeat the exercise with the opposite side. Repeat __________ times. Complete this exercise __________ times per day.  STRETCH - Hamstrings, Standing  Stand or sit and extend your right / left leg, placing your foot on a chair or foot stool  Keeping a slight arch in your low back and your hips straight forward.  Lead with your chest and   lean forward at the waist until you feel a gentle stretch in the back of your right / left knee or thigh. (When done correctly, this exercise requires leaning only a small distance.)  Hold this position for __________ seconds. Repeat __________ times. Complete this stretch __________ times per day. STRENGTHENING - Deep Abdominals, Pelvic Tilt   Lie on a firm bed or floor. Keeping your legs in front of you, bend your knees so they are both pointed  toward the ceiling and your feet are flat on the floor.  Tense your lower abdominal muscles to press your low back into the floor. This motion will rotate your pelvis so that your tail bone is scooping upwards rather than pointing at your feet or into the floor.  With a gentle tension and even breathing, hold this position for __________ seconds. Repeat __________ times. Complete this exercise __________ times per day.  STRENGTHENING - Abdominals, Crunches   Lie on a firm bed or floor. Keeping your legs in front of you, bend your knees so they are both pointed toward the ceiling and your feet are flat on the floor. Cross your arms over your chest.  Slightly tip your chin down without bending your neck.  Tense your abdominals and slowly lift your trunk high enough to just clear your shoulder blades. Lifting higher can put excessive stress on the low back and does not further strengthen your abdominal muscles.  Control your return to the starting position. Repeat __________ times. Complete this exercise __________ times per day.  STRENGTHENING - Quadruped, Opposite UE/LE Lift   Assume a hands and knees position on a firm surface. Keep your hands under your shoulders and your knees under your hips. You may place padding under your knees for comfort.  Find your neutral spine and gently tense your abdominal muscles so that you can maintain this position. Your shoulders and hips should form a rectangle that is parallel with the floor and is not twisted.  Keeping your trunk steady, lift your right hand no higher than your shoulder and then your left leg no higher than your hip. Make sure you are not holding your breath. Hold this position __________ seconds.  Continuing to keep your abdominal muscles tense and your back steady, slowly return to your starting position. Repeat with the opposite arm and leg. Repeat __________ times. Complete this exercise __________ times per day. Document Released:  11/04/2005 Document Revised: 01/09/2012 Document Reviewed: 01/29/2009 ExitCare Patient Information 2015 [employer]. This information is not intended to replace advice given to you by your health care provider. Make sure you discuss any questions you have with your health care provider.  

## 2014-05-23 NOTE — Progress Notes (Signed)
Subjective:    Patient ID: [name], female    DOB: 01/23/1971, 43 y.o.   MRN: [id number]  HPI Fall at home backwards onto buttocks in Nov 2011.  Immediate onset of pain.  Was evaluated by PCP and referred to radiology for vertebral augmentation.  Underwent vertebroplasty on 10/22/2013.  Radiology re eval in Jan 6 , 2015 showed improved mobility and pain .  Still complaining of some ongoing symptoms Pain Inventory Average Pain 7 Pain Right Now 4 My pain is dull and aching  In the last 24 hours, has pain interfered with the following? General activity 4 Relation with others 7 Enjoyment of life 4 What TIME of day is your pain at its worst? evening Sleep (in general) Poor  Pain is worse with: walking, bending, sitting, inactivity, unsure and some activites Pain improves with: rest, heat/ice and medication Relief from Meds: 5  Mobility walk without assistance how many minutes can you walk? 15-20 ability to climb steps?  no do you drive?  no Do you have any goals in this area?  no  Function employed # of hrs/week . disabled: date disabled . I need assistance with the following:  household duties and shopping  Neuro/Psych bladder control problems bowel control problems weakness numbness trouble walking spasms dizziness confusion depression anxiety  Prior Studies bone scan x-rays CT/MRI nerve study MRI LUMBAR SPINE WITHOUT CONTRAST  TECHNIQUE:  Multiplanar, multisequence MR imaging was performed. No intravenous  contrast was administered.  COMPARISON: 06/07/2012 MR.  FINDINGS:  Last fully open disk space is labeled L5-S1. Present examination  incorporates from T11-12 disc space through the lower sacrum.  Conus lower L2 level.  New from the prior examination is an anterior wedge compression  fracture of the L1 vertebral body involving the superior endplate  with 40% loss of height anteriorly. Mild surrounding edema  suggesting that this has not  completely healed and may be related to  the patient's prior episode of trauma 2 months ago. Minimal kyphosis  without retropulsion. No surrounding soft tissue abnormality. MR  appearance is most consistent with a benign compression fracture.  T11-12: Anterior osteophyte.  T12-L1: Minimal bulge.  L1-2: Negative.  L2-3: Negative.  L3-4: Very small Schmorl's node deformity.  L4-5: Minimal left lateral bulge without nerve root compression.  L5-S1: Minimal Schmorl's node deformity. Very mild facet joint  degenerative changes.  IMPRESSION:  New from the prior examination is an anterior wedge compression  fracture of the L1 vertebral body involving the superior endplate  with 40% loss of height anteriorly. Mild surrounding edema  suggesting that this has not completely healed and may be related to  the patient's prior episode of trauma 2 months ago. Minimal kyphosis  without retropulsion.  Please see above  Electronically Signed  By: Bridgett Larsson M.D.  Physicians involved in your care Primary care . Psychiatrist . Neurosurgeon . Orthopedist . Psychologist .   Family History  Problem Relation Age of Onset  . Cancer Maternal Aunt    History   Social History  . Marital Status: Legally Separated    Spouse Name: N/A    Number of Children: N/A  . Years of Education: N/A   Social History Main Topics  . Smoking status: Current Every Day Smoker -- 0.40 packs/day    Types: Cigarettes    Last Attempt to Quit: 10/22/2012  . Smokeless tobacco: Never Used     Comment: 3 per day  . Alcohol Use: No  . Drug Use:  No  . Sexual Activity: Yes    Birth Control/ Protection: Surgical     Comment: hysterectomy   Other Topics Concern  . None   Social History Narrative  . None   Past Surgical History  Procedure Laterality Date  . 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 Cesarean section  1998  . Tonsillectomy    . Wisdom tooth extraction    . Cholecystectomy    . Left elbow      . Abdominal hysterectomy  2000  . Appendectomy     Past Medical History  Diagnosis Date  . Deep venous thrombosis of leg 2012    completed year of coumadin   . Hx of measles   . Migraines   . Monilia infection 12/31/10  . Vaginal atrophy 03/04/11  . Dyspareunia 03/04/11  . Bipolar 1 disorder     Followed by Mental Health.  All psychiatric medications prescribed by Mental Health.  Stable for many years.    . Hypothyroidism     Hypothyroidism since 8th grade.  Managed by Dr. Sharl MaKerr, Endocrinology.  On synthroid.  No prior thyroid surgery/ablation.    BP 144/90  Pulse 84  Resp 14  Ht 5\' 5"  (1.651 m)  Wt 213 lb (96.616 kg)  BMI 35.44 kg/m2  SpO2 97%  Opioid Risk Score:  10 Fall Risk Score: High Fall Risk (>13 points) (patient educated handout given)   Review of Systems  Gastrointestinal: Positive for diarrhea and constipation.  Genitourinary: Positive for difficulty urinating.  Musculoskeletal: Positive for back pain and gait problem.  Neurological: Positive for dizziness, weakness and numbness.  Psychiatric/Behavioral: Positive for confusion and dysphoric mood. The patient is nervous/anxious.   All other systems reviewed and are negative.      Objective:   Physical Exam  Nursing note and vitals reviewed. Constitutional: She is oriented to person, place, and time. She appears well-developed and well-nourished.  obese  HENT:  Head: Normocephalic and atraumatic.  Eyes: Pupils are equal, round, and reactive to light.  Neck: Normal range of motion.  Cardiovascular: Normal rate, regular rhythm and normal heart sounds.   Pulmonary/Chest: Effort normal and breath sounds normal.  Abdominal: Soft.  Musculoskeletal: Normal range of motion.  Neurological: She is alert and oriented to person, place, and time. She has normal strength. Gait normal.  Reflex Scores:      Tricep reflexes are 3+ on the right side and 3+ on the left side.      Bicep reflexes are 3+ on the right side and 3+  on the left side.      Brachioradialis reflexes are 3+ on the right side and 3+ on the left side.      Patellar reflexes are 1+ on the right side and 2+ on the left side.      Achilles reflexes are 0 on the right side and 1+ on the left side. Motor strength 5/5 bilateral deltoid, biceps contrast, grip, hip flexor, knee extensors, ankle dorsiflexor Normal sensation lateral C5-6-7 8 L2 L3-L4 L5-S1 dermatomal distribution  Psychiatric: She has a normal mood and affect.          Assessment & Plan:  1. Chronic low back pain starting in November of last year with a fall. Her compression fracture should be healed by now. In addition her pain is in a more diffuse area suggesting that it may involve other structures. Given her obesity plus pain with extension I suspect she may have  facet mediated pain. Have given her exercises. If he performs these on a daily basis for a month and still does not get much relief would consider lumbar medial branch blocks. We discussed her elevated opioid risk total score making her a high-risk for opiate misuse. Discussed that we would not prescribe any medication stronger than tramadol at this clinic. Should she desire narcotic analgesic medications she would need to be in a clinic that has in house addiction medicine consultant.

## 2014-05-27 ENCOUNTER — Telehealth: Payer: Self-pay

## 2014-05-27 NOTE — Telephone Encounter (Signed)
[facility]

## 2014-05-28 MED ORDER — CYCLOBENZAPRINE HCL 5 MG PO TABS: 5.0000 mg | ORAL_TABLET | Freq: Two times a day (BID) | ORAL | Status: DC

## 2014-05-28 MED ORDER — TRAMADOL HCL 50 MG PO TABS: 50.0000 mg | ORAL_TABLET | Freq: Four times a day (QID) | ORAL | Status: DC | PRN

## 2014-05-28 NOTE — Telephone Encounter (Signed)
Left message advising patient her medications have been called into [facility].

## 2014-05-28 NOTE — Telephone Encounter (Signed)
flexeril 5mg  po BID #60 2 RF Tramadol 50mg  1 po q6h #120 1RF

## 2014-05-30 ENCOUNTER — Ambulatory Visit (HOSPITAL_COMMUNITY): Payer: Medicaid Other

## 2014-06-03 ENCOUNTER — Other Ambulatory Visit: Payer: Self-pay | Admitting: Internal Medicine

## 2014-06-03 ENCOUNTER — Ambulatory Visit: Payer: Medicaid Other

## 2014-06-03 ENCOUNTER — Ambulatory Visit
Admission: RE | Admit: 2014-06-03 | Discharge: 2014-06-03 | Disposition: A | Discharge status: Home / Self Care | Payer: Medicaid Other | Source: Ambulatory Visit

## 2014-06-03 ENCOUNTER — Ambulatory Visit
Admission: RE | Admit: 2014-06-03 | Discharge: 2014-06-03 | Disposition: A | Discharge status: Home / Self Care | Payer: Medicaid Other | Source: Ambulatory Visit | Attending: Internal Medicine | Admitting: Internal Medicine

## 2014-06-03 DIAGNOSIS — Z8781 Personal history of (healed) traumatic fracture: Secondary | ICD-10-CM

## 2014-06-03 DIAGNOSIS — Z1231 Encounter for screening mammogram for malignant neoplasm of breast: Secondary | ICD-10-CM

## 2014-06-03 MED ORDER — DIAZEPAM 5 MG PO TABS: 5.0000 mg | ORAL_TABLET | Freq: Three times a day (TID) | ORAL | Status: DC | PRN

## 2014-06-03 NOTE — Progress Notes (Unsigned)
Pt was notified that script for diazepam would be called to pharm, [facility] [address]. Called to pharm

## 2014-06-03 NOTE — Progress Notes (Unsigned)
Pt given rx for ativan 5mg  1 tab to take 30 minutes prior to MRI. - Dr. Danella Pentonruong 06/03/14

## 2014-06-04 ENCOUNTER — Telehealth: Payer: Self-pay | Admitting: Internal Medicine

## 2014-06-04 MED ORDER — MECLIZINE HCL 25 MG PO TABS
25.0000 mg | ORAL_TABLET | Freq: Two times a day (BID) | ORAL | Status: DC | PRN
Start: 2014-06-04 — End: 2014-09-09

## 2014-06-04 NOTE — Telephone Encounter (Signed)
   Reason for call:   I received a call from [name] at 6 PM indicating that she has run out of her meclizine and needs a refill and forgot to call earlier today.    Pertinent Data:   Remains occassionally dizzy when standing and improved with meclizine as prescribed by Dr. Shirlee LatchMcLean on 7/22. She was given 30 tablets and has run out of them 2 days ago. She describes ongoing dizziness worse when standing same as before and unchanged. No recent fall but felt like she may fall.   I discussed vestibular rehab per Dr. Kathlyn SacramentoMcLeans instructions but she was not aware of this recommendation and will need to discuss further with PCP.   Reports falling asleep earlier and that is why she could not call earlier.    Assessment / Plan / Recommendations:   [name] is a 43 year old lady recently seen in opc by Dr. Shirlee LatchMcLean who prescribed meclizine for vertigo. Meclizine has improved symptoms and she is requesting a refill.   Will refill limited supply again today and forward note to pcp to have her follow up with  Dr. Danella Pentonruong if she wishes to continue the medication and consider discussing vestibular rehab.   As always, pt is advised that if symptoms worsen or new symptoms arise, they should go to an urgent care facility or to to ER for further evaluation.   Baltazar ApoSamaya J Ayden Hardwick, MD   06/04/2014, 7:25 PM

## 2014-06-06 ENCOUNTER — Ambulatory Visit (HOSPITAL_COMMUNITY)
Admission: RE | Admit: 2014-06-06 | Discharge: 2014-06-06 | Disposition: A | Discharge status: Home / Self Care | Payer: Medicaid Other | Source: Ambulatory Visit | Attending: Internal Medicine | Admitting: Internal Medicine

## 2014-06-06 DIAGNOSIS — M5126 Other intervertebral disc displacement, lumbar region: Secondary | ICD-10-CM | POA: Insufficient documentation

## 2014-06-06 DIAGNOSIS — G8929 Other chronic pain: Secondary | ICD-10-CM

## 2014-06-06 DIAGNOSIS — M545 Low back pain, unspecified: Secondary | ICD-10-CM | POA: Insufficient documentation

## 2014-06-06 DIAGNOSIS — M48061 Spinal stenosis, lumbar region without neurogenic claudication: Secondary | ICD-10-CM | POA: Diagnosis not present

## 2014-06-09 ENCOUNTER — Telehealth (HOSPITAL_COMMUNITY): Payer: Self-pay | Admitting: Interventional Radiology

## 2014-06-09 ENCOUNTER — Telehealth: Payer: Self-pay

## 2014-06-09 ENCOUNTER — Encounter: Payer: Self-pay | Admitting: Internal Medicine

## 2014-06-09 NOTE — Telephone Encounter (Signed)
Contacted patient to inform her that Dr. Wynn BankerKirsteins is not willing to prescribe her anything stronger than Tramadol. Patient seems upset and stated "she wasn't asking for Hydrocodone forever".

## 2014-06-09 NOTE — Telephone Encounter (Signed)
Pt called to tell me that she had an MRI of her back on 06/06/14 and wanted to know if there was a fracture that Deveshwar could treat. I looked at the final report and told her that she did not have any new fractures and recommended that she contact her orthopedic doctor for treatment of her condition. She stated understanding and was in agreement w/ this plan. JMichaux

## 2014-06-09 NOTE — Telephone Encounter (Signed)
Patient states she had a MRI recently and they called her with the results which were herniated discs at L4-5. Patient states she is getting no relief from the Flexeril and Tramadol. She is requesting a alternative for pain relief. Please advise.

## 2014-06-09 NOTE — Telephone Encounter (Signed)
Elevated opioid risk. No narcotic stronger than tramadol. Plan is to set up for medial branch blocks under fluoroscopic guidance as next step

## 2014-06-11 ENCOUNTER — Encounter: Payer: Medicaid Other | Admitting: Internal Medicine

## 2014-06-11 ENCOUNTER — Telehealth: Payer: Self-pay | Admitting: *Deleted

## 2014-06-11 NOTE — Telephone Encounter (Signed)
Pt called requesting that MRI be sent to Dr. Cleophas DunkerWhitfield.  Pt is trying to set up her own appointment.  Unable to come in to sign a record release of information.  Pt said that she had talked with Dr. Shirlee LatchMcLean about going to see Dr. Cleophas DunkerWhitfield.  Pt was advised that unless there is an official referral the records can not be sent without a ROI being signed.  Pt is unable to do so.  Suggestion that Dr. Shirlee LatchMcLean do a referral to Orthopaedic Surgery. And then the records can be sent to Dr. Cleophas DunkerWhitfield for appointment scheduling.  Angelina OkGladys Herbin, RN 06/11/2014 4:58 PM

## 2014-06-12 ENCOUNTER — Other Ambulatory Visit: Payer: Self-pay | Admitting: Internal Medicine

## 2014-06-12 DIAGNOSIS — M545 Low back pain, unspecified: Secondary | ICD-10-CM

## 2014-06-24 ENCOUNTER — Encounter: Payer: Medicaid Other | Admitting: Registered Nurse

## 2014-06-24 ENCOUNTER — Other Ambulatory Visit: Payer: Self-pay | Admitting: Internal Medicine

## 2014-06-25 ENCOUNTER — Telehealth: Payer: Self-pay | Admitting: Internal Medicine

## 2014-06-25 ENCOUNTER — Encounter: Payer: Medicaid Other | Admitting: Internal Medicine

## 2014-06-25 MED ORDER — GABAPENTIN 300 MG PO CAPS: 300.0000 mg | ORAL_CAPSULE | Freq: Three times a day (TID) | ORAL | Status: DC

## 2014-06-25 NOTE — Telephone Encounter (Signed)
Received a page in the on-call pager from [name]. She states that she could not go to her appointment today at the [facility] due to lack of money to pay for her co-pay. She states that she called the clinic twice today to request refill for her gabapentin but she could not get through.   In reviewing her chart she was last prescribed this medication on 04/21/14 and states that she did well on it and would like to restart it.   Rx sent to her pharmacy, [facility] for  Gabapentin  PO 1 capsule TID #90 no refills.   She agrees to follow up with her PCP for further refills.     Sara Chu, MD PGY-3 IMTS 06/25/14, 5:44 PM

## 2014-06-27 ENCOUNTER — Encounter: Payer: Medicaid Other | Admitting: Registered Nurse

## 2014-07-03 ENCOUNTER — Telehealth: Payer: Self-pay | Admitting: *Deleted

## 2014-07-03 NOTE — Telephone Encounter (Signed)
Dr Danella Penton has seen pt but mouth blisters were not present to be examined and therefore not addressed. Will defer to her.

## 2014-07-03 NOTE — Telephone Encounter (Signed)
Pt calls and states she has blisters in her mouth and cannot afford to come to clinic to be seen, she would like a script of magic mouthwash sent to [facility], please advise

## 2014-07-20 ENCOUNTER — Encounter (HOSPITAL_COMMUNITY): Payer: Self-pay | Admitting: Emergency Medicine

## 2014-07-20 ENCOUNTER — Emergency Department (HOSPITAL_COMMUNITY): Payer: Medicaid Other

## 2014-07-20 ENCOUNTER — Emergency Department (HOSPITAL_COMMUNITY)
Admission: EM | Admit: 2014-07-20 | Discharge: 2014-07-20 | Disposition: A | Discharge status: Home / Self Care | Payer: Medicaid Other | Attending: Emergency Medicine | Admitting: Emergency Medicine

## 2014-07-20 DIAGNOSIS — R1031 Right lower quadrant pain: Secondary | ICD-10-CM | POA: Diagnosis not present

## 2014-07-20 DIAGNOSIS — F319 Bipolar disorder, unspecified: Secondary | ICD-10-CM | POA: Diagnosis not present

## 2014-07-20 DIAGNOSIS — Z86718 Personal history of other venous thrombosis and embolism: Secondary | ICD-10-CM | POA: Insufficient documentation

## 2014-07-20 DIAGNOSIS — F17200 Nicotine dependence, unspecified, uncomplicated: Secondary | ICD-10-CM | POA: Diagnosis not present

## 2014-07-20 DIAGNOSIS — E039 Hypothyroidism, unspecified: Secondary | ICD-10-CM | POA: Insufficient documentation

## 2014-07-20 DIAGNOSIS — Z8619 Personal history of other infectious and parasitic diseases: Secondary | ICD-10-CM | POA: Insufficient documentation

## 2014-07-20 DIAGNOSIS — G43909 Migraine, unspecified, not intractable, without status migrainosus: Secondary | ICD-10-CM | POA: Diagnosis not present

## 2014-07-20 DIAGNOSIS — Z8742 Personal history of other diseases of the female genital tract: Secondary | ICD-10-CM | POA: Diagnosis not present

## 2014-07-20 DIAGNOSIS — Z79899 Other long term (current) drug therapy: Secondary | ICD-10-CM | POA: Insufficient documentation

## 2014-07-20 LAB — CBC WITH DIFFERENTIAL/PLATELET
Basophils Absolute: 0 10*3/uL (ref 0.0–0.1)
Basophils Relative: 0 % (ref 0–1)
Eosinophils Absolute: 0.1 10*3/uL (ref 0.0–0.7)
Eosinophils Relative: 1 % (ref 0–5)
HCT: 41.9 % (ref 36.0–46.0)
HEMOGLOBIN: 14.5 g/dL (ref 12.0–15.0)
LYMPHS ABS: 3.7 10*3/uL (ref 0.7–4.0)
Lymphocytes Relative: 33 % (ref 12–46)
MCH: 29.3 pg (ref 26.0–34.0)
MCHC: 34.6 g/dL (ref 30.0–36.0)
MCV: 84.6 fL (ref 78.0–100.0)
Monocytes Absolute: 0.5 10*3/uL (ref 0.1–1.0)
Monocytes Relative: 4 % (ref 3–12)
NEUTROS PCT: 62 % (ref 43–77)
Neutro Abs: 6.9 10*3/uL (ref 1.7–7.7)
Platelets: 199 10*3/uL (ref 150–400)
RBC: 4.95 MIL/uL (ref 3.87–5.11)
RDW: 13.2 % (ref 11.5–15.5)
WBC: 11.2 10*3/uL — AB (ref 4.0–10.5)

## 2014-07-20 LAB — COMPREHENSIVE METABOLIC PANEL
ALK PHOS: 96 U/L (ref 39–117)
ALT: 25 U/L (ref 0–35)
ANION GAP: 12 (ref 5–15)
AST: 18 U/L (ref 0–37)
Albumin: 3.7 g/dL (ref 3.5–5.2)
BILIRUBIN TOTAL: 0.2 mg/dL — AB (ref 0.3–1.2)
BUN: 13 mg/dL (ref 6–23)
CHLORIDE: 103 meq/L (ref 96–112)
CO2: 23 mEq/L (ref 19–32)
Calcium: 9 mg/dL (ref 8.4–10.5)
Creatinine, Ser: 1.17 mg/dL — ABNORMAL HIGH (ref 0.50–1.10)
GFR calc Af Amer: 64 mL/min — ABNORMAL LOW (ref >90)
GFR calc non Af Amer: 55 mL/min — ABNORMAL LOW (ref >90)
GLUCOSE: 79 mg/dL (ref 70–99)
POTASSIUM: 3.6 meq/L — AB (ref 3.7–5.3)
SODIUM: 138 meq/L (ref 137–147)
TOTAL PROTEIN: 7 g/dL (ref 6.0–8.3)

## 2014-07-20 LAB — URINALYSIS, ROUTINE W REFLEX MICROSCOPIC
BILIRUBIN URINE: NEGATIVE
Glucose, UA: NEGATIVE mg/dL
Hgb urine dipstick: NEGATIVE
Ketones, ur: NEGATIVE mg/dL
Leukocytes, UA: NEGATIVE
NITRITE: NEGATIVE
PH: 5 (ref 5.0–8.0)
Protein, ur: NEGATIVE mg/dL
Specific Gravity, Urine: 1.011 (ref 1.005–1.030)
Urobilinogen, UA: 0.2 mg/dL (ref 0.0–1.0)

## 2014-07-20 LAB — LIPASE, BLOOD: Lipase: 30 U/L (ref 11–59)

## 2014-07-20 MED ORDER — ONDANSETRON 4 MG PO TBDP
8.0000 mg | ORAL_TABLET | Freq: Once | ORAL | Status: AC
  Administered 2014-07-20: 8 mg via ORAL
  Filled 2014-07-20: qty 2

## 2014-07-20 MED ORDER — OXYCODONE-ACETAMINOPHEN 5-325 MG PO TABS
1.0000 | ORAL_TABLET | Freq: Once | ORAL | Status: AC
Start: 2014-07-20 — End: 2014-07-20
  Administered 2014-07-20: 1 via ORAL
  Filled 2014-07-20: qty 1

## 2014-07-20 MED ORDER — HYDROCODONE-ACETAMINOPHEN 5-325 MG PO TABS: 1.0000 | ORAL_TABLET | Freq: Four times a day (QID) | ORAL | Status: DC | PRN

## 2014-07-20 MED ORDER — FENTANYL CITRATE 0.05 MG/ML IJ SOLN
50.0000 ug | Freq: Once | INTRAMUSCULAR | Status: AC
  Administered 2014-07-20: 50 ug via INTRAVENOUS
  Filled 2014-07-20: qty 2

## 2014-07-20 MED ORDER — ONDANSETRON HCL 4 MG PO TABS: 4.0000 mg | ORAL_TABLET | Freq: Four times a day (QID) | ORAL | Status: DC

## 2014-07-20 MED ORDER — ONDANSETRON HCL 4 MG/2ML IJ SOLN
4.0000 mg | Freq: Once | INTRAMUSCULAR | Status: AC
  Administered 2014-07-20: 4 mg via INTRAVENOUS
  Filled 2014-07-20: qty 2

## 2014-07-20 NOTE — ED Notes (Signed)
Patient transported to CT 

## 2014-07-20 NOTE — ED Notes (Signed)
The pt is c/o rt lower abd pain since yesterday  Nauseated.  She feel like she cannot empty her bladder very well.  lmp none hyst

## 2014-07-20 NOTE — ED Provider Notes (Signed)
CSN: [id number]     Arrival date & time 07/20/14  1814 History   First MD Initiated Contact with Patient 07/20/14 1942     Chief Complaint  Patient presents with  . Abdominal Pain     (Consider location/radiation/quality/duration/timing/severity/associated sxs/prior Treatment) HPI Comments: Patient presenting today with right flank pain radiating to her right lower abdomen.  She reports sudden onset of pain at 4:30 AM this morning.  She states that the pain has been intermittent throughout the day.  She reports that the pain becomes intense and sharp when it comes on, but will then ease up.  She has taken Ibuprofen for the pain without relief.  Pain associated with nausea, but no vomiting.  No fever or chills.  No constipation.  No diarrhea.  No urinary symptoms.  No vaginal discharge or bleeding.  Past surgical history significant for Complete Hysterectomy with bilateral Oophorectomy, Appendectomy, and Cholecystectomy.    The history is provided by the patient.    Past Medical History  Diagnosis Date  . Deep venous thrombosis of leg 2012    completed year of coumadin   . Hx of measles   . Migraines   . Monilia infection 12/31/10  . Vaginal atrophy 03/04/11  . Dyspareunia 03/04/11  . Bipolar 1 disorder     Followed by Mental Health.  All psychiatric medications prescribed by Mental Health.  Stable for many years.    . Hypothyroidism     Hypothyroidism since 8th grade.  Managed by Dr. Sharl Ma, Endocrinology.  On synthroid.  No prior thyroid surgery/ablation.    Past Surgical History  Procedure Laterality Date  . 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 Cesarean section  1998  . Tonsillectomy    . Wisdom tooth extraction    . Cholecystectomy    . Left elbow    . Abdominal hysterectomy  2000  . Appendectomy     Family History  Problem Relation Age of Onset  . Cancer Maternal Aunt    History  Substance Use Topics  . Smoking status: Current Every Day Smoker -- 0.40 packs/day     Types: Cigarettes    Last Attempt to Quit: 10/22/2012  . Smokeless tobacco: Never Used     Comment: 3 per day  . Alcohol Use: No   OB History   Grav Para Term Preterm Abortions TAB SAB Ect Mult Living   Review of Systems  All other systems reviewed and are negative.     Allergies  Codeine; Meloxicam; Morphine and related; Naproxen; and Sulfonamide derivatives  Home Medications   Prior to Admission medications   Medication Sig Start Date End Date Taking? Authorizing Provider  ARIPiprazole (ABILIFY) 15 MG tablet Take 15 mg by mouth daily.    Historical Provider, MD  buPROPion (WELLBUTRIN XL) 150 MG 24 hr tablet Take 150 mg by mouth daily.    Historical Provider, MD  clonazePAM (KLONOPIN) 1 MG tablet Take 1 mg by mouth 2 (two) times daily.    Historical Provider, MD  cyclobenzaprine (FLEXERIL) 5 MG tablet Take 1 tablet (5 mg total) by mouth every 8 (eight) hours as needed for muscle spasms. 05/07/14   Gara Kroner, MD  cyclobenzaprine (FLEXERIL) 5 MG tablet Take 1 tablet (5 mg total) by mouth 2 (two) times daily. 05/28/14 06/07/14  Erick Colace, MD  diazepam (VALIUM) 5 MG tablet Take 1 tablet (5  mg total) by mouth every 8 (eight) hours as needed for anxiety (take one tab 30 minutes prior to MRI). take one tab 30 minutes prior to MRI 06/03/14 06/03/15  Gara Kroner, MD  gabapentin (NEURONTIN) 300 MG capsule TAKE 1 CAPSULE BY MOUTH THREE TIMES DAILY    Nischal Narendra, MD  gabapentin (NEURONTIN) 300 MG capsule Take 1 capsule (300 mg total) by mouth 3 (three) times daily. 06/25/14   Ky Barban, MD  levothyroxine (SYNTHROID, LEVOTHROID) 100 MCG tablet Take 1 tablet (100 mcg total) by mouth daily before breakfast. 05/07/14   Gara Kroner, MD  liothyronine (CYTOMEL) 5 MCG tablet Take 1 tablet (5 mcg total) by mouth daily. 04/09/14   Linward Headland, MD  meclizine (ANTIVERT) 25 MG tablet Take 1 tablet (25 mg total) by mouth 2 (two) times daily as needed for dizziness. 06/04/14    Baltazar Apo, MD  ondansetron (ZOFRAN-ODT) 4 MG disintegrating tablet Take 4 mg by mouth daily as needed for nausea or vomiting.    Historical Provider, MD  pantoprazole (PROTONIX) 40 MG tablet Take 1 tablet (40 mg total) by mouth daily. 04/10/14   Linward Headland, MD  protective barrier (RESTORE) CREA Apply 1 application topically daily.    Historical Provider, MD  simvastatin (ZOCOR) 40 MG tablet Take 1 tablet (40 mg total) by mouth every morning. 01/01/14   Linward Headland, MD  traMADol (ULTRAM) 50 MG tablet Take 1 tablet (50 mg total) by mouth every 6 (six) hours as needed (pain). 05/28/14   Erick Colace, MD  traZODone (DESYREL) 100 MG tablet Take 1 tablet (100 mg total) by mouth at bedtime. 03/11/14   Linward Headland, MD   BP 153/94  Pulse 84  Temp(Src) 98.7 F (37.1 C) (Oral)  Resp 18  Ht  (1.651 m)  Wt 215 lb (97.523 kg)  BMI 35.78 kg/m2  SpO2 97% Physical Exam  Nursing note and vitals reviewed. Constitutional: She appears well-developed and well-nourished.  HENT:  Head: Normocephalic and atraumatic.  Mouth/Throat: Oropharynx is clear and moist.  Neck: Normal range of motion. Neck supple.  Cardiovascular: Normal rate, regular rhythm and normal heart sounds.   Pulmonary/Chest: Effort normal and breath sounds normal.  Abdominal: Soft. Bowel sounds are normal. She exhibits no distension and no mass. There is tenderness in the right lower quadrant. There is no rebound and no guarding.  Right CVA tenderness  Neurological: She is alert.  Skin: Skin is warm and dry.  Psychiatric: She has a normal mood and affect.    ED Course  Procedures (including critical care time) Labs Review Labs Reviewed  CBC WITH DIFFERENTIAL - Abnormal; Notable for the following:    WBC 11.2 (*)    All other components within normal limits  COMPREHENSIVE METABOLIC PANEL - Abnormal; Notable for the following:    Potassium 3.6 (*)    Creatinine, Ser 1.17 (*)    Total Bilirubin 0.2 (*)    GFR calc  non Af Amer 55 (*)    GFR calc Af Amer 64 (*)    All other components within normal limits  URINALYSIS, ROUTINE W REFLEX MICROSCOPIC  LIPASE, BLOOD    Imaging Review Ct Renal Stone Study  07/20/2014   CLINICAL DATA:  Right lower abdomen pain since yesterday with nausea feels like cannot empty bladder  EXAM: CT ABDOMEN AND PELVIS WITHOUT CONTRAST  TECHNIQUE: Multidetector CT imaging of the abdomen and pelvis was performed following the standard protocol without IV  contrast.  COMPARISON:  09/15/2009  FINDINGS: Visualized portions of the lung bases are clear. Status post cholecystectomy. Liver and spleen normal. Pancreas normal. Adrenal glands normal. Kidneys normal. Ureters normal. No stones. No hydronephrosis. Bladder normal.  Reproductive organs appear to be absent. Small and large bowel normal except for mild diverticulosis throughout much of the colon. Appears to be status post cholecystectomy. Stomach normal. No acute musculoskeletal findings. There is an L1 compression deformity of about 33%, new from 2010 but noted to be status post kyphoplasty.  IMPRESSION: No acute abnormalities.  No obstructive nephropathy.   Electronically Signed   By: Esperanza Heir M.D.   On: 07/20/2014 20:45     EKG Interpretation None      MDM   Final diagnoses:  None   Patient presenting today with right flank pain and right lower abdominal pain, which has been intermittent throughout the day today.  No rebound or guarding on exam.  Labs today unremarkable aside from mild leukocytosis.  Patient afebrile.  UA negative.  Past surgical history significant for Total Hysterectomy with bilateral Oophorectomy, Appendectomy, and Cholecystectomy.  CT renal stone study ordered to evaluate for kidney stone.  No acute abnormalities on CT.  Feel that the patient is stable for discharge.  Return precautions given.    Santiago Glad, PA-C 07/20/14 2247

## 2014-07-20 NOTE — ED Provider Notes (Signed)
Medical screening examination/treatment/procedure(s) were conducted as a shared visit with non-physician practitioner(s) and myself.  I personally evaluated the patient during the encounter.   EKG Interpretation None      I interviewed and examined the patient. Lungs are CTAB. Cardiac exam wnl. Abdomen soft w/ ttp of RLQ. I interpreted/reviewed the labs and/or imaging which were non-contributory.  Pt continues to appear well. Will rec f/u w/ pcp.   Purvis Sheffield, MD 07/20/14 [phone]

## 2014-07-20 NOTE — ED Notes (Signed)
The pt reports that she can take percocet without problems.  See her allergies

## 2014-07-21 ENCOUNTER — Telehealth: Payer: Self-pay | Admitting: Internal Medicine

## 2014-07-21 NOTE — Telephone Encounter (Signed)
  Reason for call:   I placed an outgoing call to [name] at 9:45  PM regarding abdominal pain.  She reports that yesterday morning at 4:30 am she awoke from sleep with RUQ abdominal pain.  She felt terrible throughout the day and presented last night to [facility], where she was evaluated with a CMP, U/A, Lipase, and CT scan of her abdomen.  These tests were grossly unremarkable (very mild leukocytosis of 11.2) and she was discharged with reassurance and instructed to follow up with her PCP.  She called in and was given a follow up appointment with Dr. Danella Penton in 1 week.  However she notes that she has continued to have a lot of abdominal pain.  She reports that she has been able to drink ginger ale and coke but vomited twice today when she tried other things.  She reports associated symptoms of nausea.  She denies fever, chills, diarrhea, dysuria.   Assessment/ Plan:   Abdominal pain of unclear etiology, given her history of cholecystectomy, appendectomy, TAH and labs in her visit with the ED yesterday are reassuring however she continues to be in pain.  I advised her that returning to the ED would be advisable as she has not had improvement in her pain.  She however reports she does not want to go back to the ED at this time.  I instructed her to continue to stay hydrated with fluids and that I would have our clinic contact her in the morning to get a closer follow up.  I also advised if she is unable to tolerate liquids, feels worse, or develops a fever to go to the ED.   Wendy Rung, DO   07/21/2014, 9:48 PM

## 2014-07-22 ENCOUNTER — Other Ambulatory Visit: Payer: Self-pay | Admitting: *Deleted

## 2014-07-22 ENCOUNTER — Ambulatory Visit: Payer: Medicaid Other | Admitting: Physical Medicine & Rehabilitation

## 2014-07-22 ENCOUNTER — Encounter: Payer: Self-pay | Admitting: Internal Medicine

## 2014-07-22 MED ORDER — GABAPENTIN 300 MG PO CAPS: 300.0000 mg | ORAL_CAPSULE | Freq: Three times a day (TID) | ORAL | Status: DC

## 2014-07-30 ENCOUNTER — Encounter: Payer: Self-pay | Admitting: Internal Medicine

## 2014-07-31 ENCOUNTER — Ambulatory Visit: Payer: Self-pay | Admitting: Internal Medicine

## 2014-08-10 ENCOUNTER — Telehealth: Payer: Self-pay | Admitting: Internal Medicine

## 2014-08-10 NOTE — Telephone Encounter (Signed)
I returned Ms. Clevenger's page around 6PM this afternoon.  She told me she gets migraines headaches and was experiencing a migraine currently.  She describes symptoms of nausea and photophobia.  She has no loss of vision or difficulty with walking or balance.  She has been eating without problem.  She would like for me to call in a prescription for Tramadol.  She says her orthopedic doctor normally prescribes it but she does not have any left.  Review of EPIC reveals that Tramadol was last prescribed by Dr. Claudette LawsAndrew Kirsteins (Pain Management Specialist) on 05/28/14.  She says she has tried Tylenol and "chocolate and coffee."  I advised her that she should limit chocolate and coffee since they can sometimes be migraine triggers.  She seemed surprised by this.   She says she cannot take NSAIDs due because of kidney problems.  I explained that I cannot call in a narcotic for her on the weekend.  I advised that she call the clinic tomorrow and schedule a sick visit or ask if her PCP would be willing to prescribe her a medication until she can be seen.  She says she has a visit with her orthopedic doctor and dentist tomorrow so she will be unable to come to clinic.  She is scheduled for a visit on 10/13 (in two days) so I advised her to keep this appointment and to come to ED if symptoms worsen.  I advised her to try Tylenol until then.  She agrees and says she is planning to come to the appointment on 10/13.  Evelena PeatAlex Omar Orrego, DO IMTS, PGY2 [phone]

## 2014-08-12 ENCOUNTER — Encounter (HOSPITAL_COMMUNITY): Payer: Self-pay | Admitting: *Deleted

## 2014-08-12 ENCOUNTER — Ambulatory Visit (INDEPENDENT_AMBULATORY_CARE_PROVIDER_SITE_OTHER): Payer: Medicaid Other | Admitting: Internal Medicine

## 2014-08-12 ENCOUNTER — Encounter: Payer: Self-pay | Admitting: Internal Medicine

## 2014-08-12 ENCOUNTER — Observation Stay (HOSPITAL_COMMUNITY)
Admission: AD | Admit: 2014-08-12 | Discharge: 2014-08-13 | Disposition: A | Discharge status: Home / Self Care | Payer: Medicaid Other | Source: Ambulatory Visit | Attending: Internal Medicine | Admitting: Internal Medicine

## 2014-08-12 VITALS — BP 172/104 | HR 90 | Temp 98.3°F | Wt 223.6 lb

## 2014-08-12 DIAGNOSIS — R51 Headache: Secondary | ICD-10-CM | POA: Diagnosis present

## 2014-08-12 DIAGNOSIS — R109 Unspecified abdominal pain: Secondary | ICD-10-CM | POA: Diagnosis present

## 2014-08-12 DIAGNOSIS — R1031 Right lower quadrant pain: Secondary | ICD-10-CM | POA: Diagnosis not present

## 2014-08-12 DIAGNOSIS — G8929 Other chronic pain: Secondary | ICD-10-CM | POA: Insufficient documentation

## 2014-08-12 DIAGNOSIS — G43109 Migraine with aura, not intractable, without status migrainosus: Secondary | ICD-10-CM

## 2014-08-12 DIAGNOSIS — R519 Headache, unspecified: Secondary | ICD-10-CM | POA: Insufficient documentation

## 2014-08-12 DIAGNOSIS — R197 Diarrhea, unspecified: Secondary | ICD-10-CM | POA: Diagnosis present

## 2014-08-12 DIAGNOSIS — R42 Dizziness and giddiness: Secondary | ICD-10-CM | POA: Diagnosis not present

## 2014-08-12 DIAGNOSIS — E7800 Pure hypercholesterolemia, unspecified: Secondary | ICD-10-CM | POA: Diagnosis present

## 2014-08-12 DIAGNOSIS — N179 Acute kidney failure, unspecified: Secondary | ICD-10-CM | POA: Diagnosis not present

## 2014-08-12 DIAGNOSIS — G4700 Insomnia, unspecified: Secondary | ICD-10-CM | POA: Diagnosis not present

## 2014-08-12 DIAGNOSIS — G43909 Migraine, unspecified, not intractable, without status migrainosus: Principal | ICD-10-CM | POA: Diagnosis present

## 2014-08-12 DIAGNOSIS — E039 Hypothyroidism, unspecified: Secondary | ICD-10-CM | POA: Diagnosis not present

## 2014-08-12 DIAGNOSIS — K219 Gastro-esophageal reflux disease without esophagitis: Secondary | ICD-10-CM | POA: Insufficient documentation

## 2014-08-12 DIAGNOSIS — M545 Low back pain, unspecified: Secondary | ICD-10-CM | POA: Diagnosis present

## 2014-08-12 DIAGNOSIS — M549 Dorsalgia, unspecified: Secondary | ICD-10-CM | POA: Insufficient documentation

## 2014-08-12 DIAGNOSIS — F3130 Bipolar disorder, current episode depressed, mild or moderate severity, unspecified: Secondary | ICD-10-CM | POA: Diagnosis present

## 2014-08-12 DIAGNOSIS — M79606 Pain in leg, unspecified: Secondary | ICD-10-CM | POA: Insufficient documentation

## 2014-08-12 DIAGNOSIS — F319 Bipolar disorder, unspecified: Secondary | ICD-10-CM | POA: Diagnosis not present

## 2014-08-12 DIAGNOSIS — H8110 Benign paroxysmal vertigo, unspecified ear: Secondary | ICD-10-CM

## 2014-08-12 LAB — CBC WITH DIFFERENTIAL/PLATELET
BASOS ABS: 0 10*3/uL (ref 0.0–0.1)
Basophils Relative: 0 % (ref 0–1)
EOS ABS: 0.1 10*3/uL (ref 0.0–0.7)
Eosinophils Relative: 1 % (ref 0–5)
HEMATOCRIT: 38.8 % (ref 36.0–46.0)
HEMOGLOBIN: 13.2 g/dL (ref 12.0–15.0)
Lymphocytes Relative: 30 % (ref 12–46)
Lymphs Abs: 2.6 10*3/uL (ref 0.7–4.0)
MCH: 29.5 pg (ref 26.0–34.0)
MCHC: 34 g/dL (ref 30.0–36.0)
MCV: 86.6 fL (ref 78.0–100.0)
MONO ABS: 0.3 10*3/uL (ref 0.1–1.0)
MONOS PCT: 4 % (ref 3–12)
Neutro Abs: 5.7 10*3/uL (ref 1.7–7.7)
Neutrophils Relative %: 65 % (ref 43–77)
Platelets: 223 10*3/uL (ref 150–400)
RBC: 4.48 MIL/uL (ref 3.87–5.11)
RDW: 13.4 % (ref 11.5–15.5)
WBC: 8.7 10*3/uL (ref 4.0–10.5)

## 2014-08-12 LAB — C-REACTIVE PROTEIN: CRP: <0.5 mg/dL (ref <0.60)

## 2014-08-12 LAB — BASIC METABOLIC PANEL WITH GFR
BUN: 15 mg/dL (ref 6–23)
CO2: 25 mEq/L (ref 19–32)
CREATININE: 1.16 mg/dL — AB (ref 0.50–1.10)
Calcium: 9.2 mg/dL (ref 8.4–10.5)
Chloride: 105 mEq/L (ref 96–112)
GFR, EST NON AFRICAN AMERICAN: 57 mL/min — AB
GFR, Est African American: 66 mL/min
GLUCOSE: 114 mg/dL — AB (ref 70–99)
POTASSIUM: 3.7 meq/L (ref 3.5–5.3)
Sodium: 143 mEq/L (ref 135–145)

## 2014-08-12 LAB — SEDIMENTATION RATE: Sed Rate: 9 mm/hr (ref 0–22)

## 2014-08-12 MED ORDER — PNEUMOCOCCAL VAC POLYVALENT 25 MCG/0.5ML IJ INJ
0.5000 mL | INJECTION | INTRAMUSCULAR | Status: AC
  Administered 2014-08-13: 0.5 mL via INTRAMUSCULAR
  Filled 2014-08-12: qty 0.5

## 2014-08-12 MED ORDER — PANTOPRAZOLE SODIUM 40 MG PO TBEC
40.0000 mg | DELAYED_RELEASE_TABLET | Freq: Every day | ORAL | Status: DC
  Administered 2014-08-13: 40 mg via ORAL
  Filled 2014-08-12: qty 1

## 2014-08-12 MED ORDER — ARIPIPRAZOLE 10 MG PO TABS
20.0000 mg | ORAL_TABLET | Freq: Every day | ORAL | Status: DC
  Administered 2014-08-13 (×2): 20 mg via ORAL
  Filled 2014-08-12 (×2): qty 2

## 2014-08-12 MED ORDER — LEVOTHYROXINE SODIUM 100 MCG PO TABS
100.0000 ug | ORAL_TABLET | Freq: Every day | ORAL | Status: DC
  Administered 2014-08-13: 100 ug via ORAL
  Filled 2014-08-12: qty 1

## 2014-08-12 MED ORDER — SODIUM CHLORIDE 0.9 % IV SOLN
INTRAVENOUS | Status: DC
  Administered 2014-08-12 – 2014-08-13 (×2): via INTRAVENOUS

## 2014-08-12 MED ORDER — CLONAZEPAM 1 MG PO TABS
1.0000 mg | ORAL_TABLET | Freq: Two times a day (BID) | ORAL | Status: DC
  Administered 2014-08-12 – 2014-08-13 (×2): 1 mg via ORAL
  Filled 2014-08-12 (×2): qty 1

## 2014-08-12 MED ORDER — LIOTHYRONINE SODIUM 5 MCG PO TABS
5.0000 ug | ORAL_TABLET | Freq: Every day | ORAL | Status: DC
  Administered 2014-08-13 (×2): 5 ug via ORAL
  Filled 2014-08-12 (×2): qty 1

## 2014-08-12 MED ORDER — TRAZODONE HCL 100 MG PO TABS
100.0000 mg | ORAL_TABLET | Freq: Every day | ORAL | Status: DC
  Administered 2014-08-12: 100 mg via ORAL
  Filled 2014-08-12: qty 1

## 2014-08-12 MED ORDER — ONDANSETRON HCL 4 MG PO TABS
4.0000 mg | ORAL_TABLET | Freq: Four times a day (QID) | ORAL | Status: DC
  Administered 2014-08-12 – 2014-08-13 (×4): 4 mg via ORAL
  Filled 2014-08-12 (×6): qty 1

## 2014-08-12 MED ORDER — HYDROCODONE-ACETAMINOPHEN 5-325 MG PO TABS
1.0000 | ORAL_TABLET | ORAL | Status: DC | PRN
  Administered 2014-08-12 – 2014-08-13 (×3): 2 via ORAL
  Filled 2014-08-12 (×3): qty 2

## 2014-08-12 MED ORDER — BUPROPION HCL ER (XL) 150 MG PO TB24
450.0000 mg | ORAL_TABLET | Freq: Every day | ORAL | Status: DC
  Administered 2014-08-13: 450 mg via ORAL
  Filled 2014-08-12: qty 3

## 2014-08-12 MED ORDER — GABAPENTIN 300 MG PO CAPS
300.0000 mg | ORAL_CAPSULE | Freq: Three times a day (TID) | ORAL | Status: DC
  Administered 2014-08-12 – 2014-08-13 (×3): 300 mg via ORAL
  Filled 2014-08-12 (×3): qty 1

## 2014-08-12 MED ORDER — CYCLOBENZAPRINE HCL 10 MG PO TABS
5.0000 mg | ORAL_TABLET | Freq: Two times a day (BID) | ORAL | Status: DC
  Administered 2014-08-12 – 2014-08-13 (×2): 5 mg via ORAL
  Filled 2014-08-12 (×2): qty 1

## 2014-08-12 MED ORDER — ACETAMINOPHEN 500 MG PO TABS: 500.0000 mg | ORAL_TABLET | Freq: Four times a day (QID) | ORAL | Status: DC | PRN

## 2014-08-12 MED ORDER — INFLUENZA VAC SPLIT QUAD 0.5 ML IM SUSY
0.5000 mL | PREFILLED_SYRINGE | INTRAMUSCULAR | Status: AC
  Administered 2014-08-13: 0.5 mL via INTRAMUSCULAR
  Filled 2014-08-12: qty 0.5

## 2014-08-12 MED ORDER — HEPARIN SODIUM (PORCINE) 5000 UNIT/ML IJ SOLN
5000.0000 [IU] | Freq: Three times a day (TID) | INTRAMUSCULAR | Status: DC
  Administered 2014-08-12 – 2014-08-13 (×3): 5000 [IU] via SUBCUTANEOUS
  Filled 2014-08-12 (×3): qty 1

## 2014-08-12 MED ORDER — SIMVASTATIN 40 MG PO TABS
40.0000 mg | ORAL_TABLET | Freq: Every morning | ORAL | Status: DC
  Administered 2014-08-13: 40 mg via ORAL
  Filled 2014-08-12: qty 1

## 2014-08-12 NOTE — Assessment & Plan Note (Addendum)
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 While she is younger than the typical [facility] patient, her symptoms are still concerning and require further workup. She is also experiencing dizziness and paresthesias. On the differential are migraines with visual disturbances, multiple sclerosis, other vasculitides, including Takayasu arteritis (no fevers or myalgias), CVA (no focal deficits), aneurism (no thunder clap headache).  - Admit to IMTS to Med-Surg - Checking BMP (LFTS normal in Sept), ESR, CRP, CBC w/ diff.  - Begin steroids, 76m prednisone or begin IV Solumedrol - Vascular Surgery consult for temporal artery biopsy - Consider CT head w/o contrast

## 2014-08-12 NOTE — Assessment & Plan Note (Addendum)
Persistent abd pain, w/u negative in the ED in Sept. Pt c/o dark, tarry stools. Diarrhea now x6 days, pt unable to give stool sample in the clinic. Orthostatics are negative. FOBT negative. No sick contacts, no recent abx use, no recent hospitalizations. - Check C.diff once able to give stool sample.  - If diarrhea continues, can consider stool pathogen panel  - Hydrate with IVF

## 2014-08-12 NOTE — Progress Notes (Signed)
Patient ID: [name], female   DOB: 1971/06/25, 43 y.o.   MRN: [id number]  Subjective:   Patient ID: [name] female   DOB: [birth year] 43 y.o.   MRN: [id number]  HPI: Ms.[name] is a 43 y.o. F w/ PMH chronic pain, bipolar, GERD, hypothyroidism, orthostatic hypotension presents c/o headaches.   She called the on call pager over the weekend c/o migraine headaches with nausea and photophobia,but denies vision changes, or balance issues. She states that she tried Tylenol w/o relief. In addition, coffee and chocolate did not provide relief- Dr. Andrey Campanile, who was the on call physician informed the patient that she should be avoiding coffee and chocolate, as theses can trigger migraines, which the patient was very surprised about. At that time, she was requesting a prescription for Tramadol be called in. Dr. Andrey Campanile declined and recommended that she continue Tylenol until she was able to have her orthopedist, who prescribes the Tramadol, was able to call in the medication for the patient.    Today she c/o 10/10 headache located in the right temporal region that is tender with palpation. She endorses vision changes with intermittent blurry vision, but denies any vision changes currently. She also endorses dizziness and paresthesias in her fingers that have been present since the headaches began approximately 2-3 days ago. She denies jaw claudication.   She is also c/o abd pain and diarrhea x6d. She has been c/o abd pain since last month and was seen in the ED 9/20, where she was also having nausea, but no vomiting or diarrhea, fevers, or urinary symptoms.  CT a/p was negative for acute findings, and UA negative. Today she c/o persistent RLQ abd pain with diarrhea x6 days that is intermittently watery to more formed stools, and is dark and tarry.    Past Medical History  Diagnosis Date  . Deep venous thrombosis of leg 2012    completed year of coumadin   . Hx of measles   . Migraines    . Monilia infection 12/31/10  . Vaginal atrophy 03/04/11  . Dyspareunia 03/04/11  . Bipolar 1 disorder     Followed by Mental Health.  All psychiatric medications prescribed by Mental Health.  Stable for many years.    . Hypothyroidism     Hypothyroidism since 8th grade.  Managed by Dr. Sharl Ma, Endocrinology.  On synthroid.  No prior thyroid surgery/ablation.    Current Outpatient Prescriptions  Medication Sig Dispense Refill  . acetaminophen (TYLENOL) 500 MG tablet Take 500 mg by mouth every 6 (six) hours as needed for mild pain or moderate pain.      . ARIPiprazole (ABILIFY) 20 MG tablet Take 20 mg by mouth daily.      Marland Kitchen buPROPion (WELLBUTRIN XL) 150 MG 24 hr tablet Take 150 mg by mouth daily.      Marland Kitchen buPROPion (WELLBUTRIN XL) 300 MG 24 hr tablet Take 300 mg by mouth daily.      . clonazePAM (KLONOPIN) 1 MG tablet Take 1 mg by mouth 2 (two) times daily.      Marland Kitchen gabapentin (NEURONTIN) 300 MG capsule Take 1 capsule (300 mg total) by mouth 3 (three) times daily.  90 capsule  6  . levothyroxine (SYNTHROID, LEVOTHROID) 100 MCG tablet Take 1 tablet (100 mcg total) by mouth daily before breakfast.  30 tablet  11  . liothyronine (CYTOMEL) 5 MCG tablet Take 1 tablet (5 mcg total) by mouth daily.  90 tablet  1  . pantoprazole (  PROTONIX) 40 MG tablet Take 1 tablet (40 mg total) by mouth daily.  30 tablet  11  . simvastatin (ZOCOR) 40 MG tablet Take 1 tablet (40 mg total) by mouth every morning.  30 tablet  11  . traZODone (DESYREL) 100 MG tablet Take 1 tablet (100 mg total) by mouth at bedtime.  30 tablet  5  . cyclobenzaprine (FLEXERIL) 5 MG tablet Take 1 tablet (5 mg total) by mouth every 8 (eight) hours as needed for muscle spasms.  30 tablet  1  . cyclobenzaprine (FLEXERIL) 5 MG tablet Take 1 tablet (5 mg total) by mouth 2 (two) times daily.  60 tablet  2  . meclizine (ANTIVERT) 25 MG tablet Take 1 tablet (25 mg total) by mouth 2 (two) times daily as needed for dizziness.  30 tablet  0  . ondansetron  (ZOFRAN) 4 MG tablet Take 1 tablet (4 mg total) by mouth every 6 (six) hours.  12 tablet  0  . ondansetron (ZOFRAN-ODT) 4 MG disintegrating tablet Take 4 mg by mouth daily as needed for nausea or vomiting.      . protective barrier (RESTORE) CREA Apply 1 application topically daily as needed.       . traMADol (ULTRAM) 50 MG tablet Take 1 tablet (50 mg total) by mouth every 6 (six) hours as needed (pain).  120 tablet  1   No current facility-administered medications for this visit.   Family History  Problem Relation Age of Onset  . Cancer Maternal Aunt    History   Social History  . Marital Status: Legally Separated    Spouse Name: N/A    Number of Children: N/A  . Years of Education: N/A   Social History Main Topics  . Smoking status: Current Every Day Smoker -- 0.40 packs/day    Types: Cigarettes    Last Attempt to Quit: 10/22/2012  . Smokeless tobacco: Never Used     Comment: 3 per day  . Alcohol Use: No  . Drug Use: No  . Sexual Activity: Yes    Birth Control/ Protection: Surgical     Comment: hysterectomy   Other Topics Concern  . None   Social History Narrative  . None   Review of Systems: Constitutional: Denies fever. +chills HEENT: +pain in left temporal region and acute vision changes.  Respiratory: Denies SOB, DOE   Cardiovascular: Denies chest pain  Gastrointestinal: +nausea, abdominal pain, diarrhea, and dark, tarry stools.  Genitourinary: Denies dysuria, urgency, frequency Musculoskeletal: Denies gait problem.  Skin: Denies rash and wound.  Neurological: + headaches. Denies weakness, light-headedness, numbness.  Psychiatric/Behavioral: Denies suicidal ideation, +anxiety and increased stress  Objective:  Physical Exam: Filed Vitals:   08/12/14 1425  BP: 172/104  Pulse: 90  Temp: 98.3 F (36.8 C)  TempSrc: Oral  Weight: 223 lb 9.6 oz (101.424 kg)  SpO2: 98%   Constitutional: Vital signs reviewed.  Patient is a well-developed and well-nourished  female in no acute distress and cooperative with exam. Alert and oriented x3.  Head: Normocephalic and atraumatic. Point tenderness to palpation in left temporal region without palpable cord.  Eyes: PERRL, EOMI  Cardiovascular: Regular rhythm, tachycardic, no MRG Pulmonary/Chest: Normal respiratory effort, CTAB, no wheezes, rales, or rhonchi Abdominal: Soft, non-distended, no masses. TTP in RLQ, bowel sounds hyperactive. GU: No CVA tenderness. DRE w/o gross blood , no hemorrhoids, FOBT neg.  Musculoskeletal: No joint pain or stiffness, no myalgias Neurological: A&O x3, Strength is normal and symmetric bilaterally, cranial nerve  II-XII are grossly intact, no focal motor deficit Skin: Warm, dry and intact.  Psychiatric: Normal mood and affect. speech and behavior is normal.   Assessment & Plan:   Please refer to Problem List based Assessment and Plan

## 2014-08-12 NOTE — Progress Notes (Signed)
1805 - MD's arrived to pt's bedside.  Andrew AuVafiadis, Sami Froh I 08/12/2014 6:26 PM

## 2014-08-12 NOTE — H&P (Signed)
Date: 08/12/2014               Patient Name:  [name] MRN: [id number]  DOB: June 30, 1971 Age / Sex: 43 y.o., female   PCP: Julious Oka, MD         Medical Service: Internal Medicine Teaching Service         Attending Physician: Dr. Michel Bickers, MD    First Contact: Dr. Venita Lick Pager: [phone]  Second Contact: Dr. Duwaine Maxin Pager: [phone]       After Hours (After 5p/  First Contact Pager: [phone]  weekends / holidays): Second Contact Pager: [phone]   Chief Complaint: headache for 6 days, diarrhea and RLQ pain for last month  History of Present Illness: [name] is a 43 yo woman with a PMH of chronic pain, bipolar disorder, insomnia, GERD, hypercholesterolemia, hypothyroidism and migraine headaches who was brought in from clinic for evaluation of her headache, abdominal pain and diarrhea.   Her headache began 6 days ago, came onand has "come and gone". It was at its worst on 3 days ago. She describes the pain as throbbing that shoots from her right temple to the back of her head; it lasts for about 30 minutes. It is occasionally accompanied by purple spots that last for 1-3 minutes before the onset of the pain; they drift outward. She has noticed some blurred vision in her eye b/l during the painful episodes; this blurred vision fully resolves with the resolution of the pain. At times, she has felt the room spinning during her painful episodes, along with some photophobia and occasional hand tingling bilaterally. At baseline, she suffers from migraine headaches; they typically present similarly, with pain in her right temple. Her pain typically radiates across her eyes like a mask. Of note, her son is visiting moved home and has been playing music loudly in the house; she has found this particularly stressful.   Her diarrhea and right lower abdominal pain started about a month ago. The diarrhea has occurred about 5x per week and has been nonbloody throughout. The RLQ  pain that accompanies it alternates between stabbing and aching in quality. Of note, she had an episode of diverticulitis in the past that was treated with antibiotics.  She denies numbness, difficulty chewing, changes in appetite, weight loss, chills, subjective fevers, nausea, vomiting or sick contacts. Of note, she has had migraines for 17 years (they started with a pregnancy around that time); her sister also has migraines. She has had a hysterectomy and appendectomy.  Meds: No current facility-administered medications for this encounter.   Current Outpatient Prescriptions  Medication Sig Dispense Refill  . acetaminophen (TYLENOL) 500 MG tablet Take 500 mg by mouth every 6 (six) hours as needed for mild pain or moderate pain.      . ARIPiprazole (ABILIFY) 20 MG tablet Take 20 mg by mouth daily.      Marland Kitchen buPROPion (WELLBUTRIN XL) 150 MG 24 hr tablet Take 150 mg by mouth daily.      Marland Kitchen buPROPion (WELLBUTRIN XL) 300 MG 24 hr tablet Take 300 mg by mouth daily.      . clonazePAM (KLONOPIN) 1 MG tablet Take 1 mg by mouth 2 (two) times daily.      . cyclobenzaprine (FLEXERIL) 5 MG tablet Take 1 tablet (5 mg total) by mouth every 8 (eight) hours as needed for muscle spasms.  30 tablet  1  . cyclobenzaprine (FLEXERIL) 5 MG tablet Take 1 tablet (5 mg  total) by mouth 2 (two) times daily.  60 tablet  2  . gabapentin (NEURONTIN) 300 MG capsule Take 1 capsule (300 mg total) by mouth 3 (three) times daily.  90 capsule  6  . levothyroxine (SYNTHROID, LEVOTHROID) 100 MCG tablet Take 1 tablet (100 mcg total) by mouth daily before breakfast.  30 tablet  11  . liothyronine (CYTOMEL) 5 MCG tablet Take 1 tablet (5 mcg total) by mouth daily.  90 tablet  1  . meclizine (ANTIVERT) 25 MG tablet Take 1 tablet (25 mg total) by mouth 2 (two) times daily as needed for dizziness.  30 tablet  0  . ondansetron (ZOFRAN) 4 MG tablet Take 1 tablet (4 mg total) by mouth every 6 (six) hours.  12 tablet  0  . ondansetron  (ZOFRAN-ODT) 4 MG disintegrating tablet Take 4 mg by mouth daily as needed for nausea or vomiting.      . pantoprazole (PROTONIX) 40 MG tablet Take 1 tablet (40 mg total) by mouth daily.  30 tablet  11  . protective barrier (RESTORE) CREA Apply 1 application topically daily as needed.       . simvastatin (ZOCOR) 40 MG tablet Take 1 tablet (40 mg total) by mouth every morning.  30 tablet  11  . traMADol (ULTRAM) 50 MG tablet Take 1 tablet (50 mg total) by mouth every 6 (six) hours as needed (pain).  120 tablet  1  . traZODone (DESYREL) 100 MG tablet Take 1 tablet (100 mg total) by mouth at bedtime.  30 tablet  5    Allergies: Allergies as of 08/12/2014 - Review Complete 08/12/2014  Allergen Reaction Noted  . Codeine    . Meloxicam Nausea Only 10/09/2012  . Morphine and related  11/22/2011  . Naproxen    . Sulfonamide derivatives  01/17/2007   Past Medical History  Diagnosis Date  . Deep venous thrombosis of leg 2012    completed year of coumadin   . Hx of measles   . Migraines   . Monilia infection 12/31/10  . Vaginal atrophy 03/04/11  . Dyspareunia 03/04/11  . Bipolar 1 disorder     Followed by Mental Health.  All psychiatric medications prescribed by Mental Health.  Stable for many years.    . Hypothyroidism     Hypothyroidism since 8th grade.  Managed by Dr. Buddy Duty, Endocrinology.  On synthroid.  No prior thyroid surgery/ablation.    Past Surgical History  Procedure Laterality Date  . 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 Cesarean section  1998  . Tonsillectomy    . Wisdom tooth extraction    . Cholecystectomy    . Left elbow    . Abdominal hysterectomy  2000  . Appendectomy     Family History  Problem Relation Age of Onset  . Cancer Maternal Aunt    History   Social History  . Marital Status: Legally Separated    Spouse Name: N/A    Number of Children: N/A  . Years of Education: N/A   Occupational History  . Not on file.   Social History Main Topics  .  Smoking status: Current Every Day Smoker -- 0.40 packs/day    Types: Cigarettes    Last Attempt to Quit: 10/22/2012  . Smokeless tobacco: Never Used     Comment: 3 per day  . Alcohol Use: No  . Drug Use: No  . Sexual Activity: Yes    Birth Control/ Protection: Surgical  Comment: hysterectomy   Other Topics Concern  . Not on file   Social History Narrative  . No narrative on file    Review of Systems: Pertinent items are noted in HPI.  Physical Exam: Blood pressure 176/101, pulse 84, temperature 98.8 F (37.1 C), temperature source Oral, resp. rate 20, height 5' 5"  (1.651 m), weight 221 lb (100.245 kg). Appearance: in NAD, lying in bed with lights out HEENT: AT/[place], glasses in place, PERRL, EOMi, tenderness to palpation in and around right temple Heart: RRR, normal S1S2 Lungs: CTAB, no wheezes Abdomen: BS+, soft, tender to deep palpation RLQ, otherwise nontender, no rebound tenderness Extremities: no edema b/l LE Neurologic: CN II-XII intact, strength 5/5 throughout, normal tone and bulk, sensation intact to light touch and pinprick, reflexes 2/4, FNF intact  Skin: SKs on back, otherwise no lesions or rashes   Lab results: Basic Metabolic Panel:  Recent Labs  08/12/14 1634  NA 143  K 3.7  CL 105  CO2 25  GLUCOSE 114*  BUN 15  CREATININE 1.16*  CALCIUM 9.2   CBC:  Recent Labs  08/12/14 1634  WBC 8.7  NEUTROABS 5.7  HGB 13.2  HCT 38.8  MCV 86.6  PLT 223   Urine Drug Screen: Drugs of Abuse     Component Value Date/Time   LABOPIA NONE DETECTED 03/17/2013 2051   COCAINSCRNUR NONE DETECTED 03/17/2013 2051   LABBENZ NONE DETECTED 03/17/2013 2051   AMPHETMU POSITIVE* 03/17/2013 2051   THCU NONE DETECTED 03/17/2013 2051   LABBARB NONE DETECTED 03/17/2013 2051     Assessment & Plan by Problem: Principal Problem:   Abdominal pain Active Problems:   Hypothyroidism   HYPERCHOLESTEROLEMIA, PURE   Bipolar disorder   Chronic low back pain   Insomnia    Vertigo   Diarrhea   Migraine headache   AKI (acute kidney injury)  Ms. Knowles is a 43 yo woman with a history of migraines, diverticulitis and vertigo who was admitted from clinic with headaches for the past 6 days and diarrhea with abdominal pain for the past month.  Migraine Headache: ESR normal. Her symptoms are classic for migraine headache. Her personal and family history of migraine headache make this diagnosis even more likely. Other entities such as temporal arteritis are possible, given the location of the patient's headache, but the fact that her headaches always start at her right temple and the fact that her vision only blurs during the pain make this less likely; furthermore, she is very young to have an episode of TA. Because of the unlikelihood of this diagnosis, steroids were not initiated. - Migraine cocktail could not be given due to the patient's allergies; tylenol and norco for pain PRN - IVF NS 75 mL/hr  Diarrhea and Abdominal Pain: Intermittent diarrhea and pain for the past month with a history of diverticulitis. However, patient is currently afebrile and does not have a white count. No recent history of antibiotic use. - Collect and send c diff - Monitor diarrhea and pain - Pain medications as above - Consider imaging if patient develops fever or white count - Requesting diet; will resume  Vertigo: Patient has a history of vertigo and experienced some "dizziness" on Sunday when headache pain was at its worst. - Zofran for any nausea PRN - IVF NS 75 mL/hr  Mild AKI: Slightly raised creatinine 1.16 (baseline 0.91) - IVF NS 75 mL/hr - Continue to trend  Hypothyroidism: - Levothyroxine and liothyronine as per home meds  GERD:  -  Continue home Protonix  Chronic Pain:  - Continue home flexeril and neurontin for back and leg pain  Insomnia:  - Continue home trazodone  Bipolar Disorder:  - Continue home Abilify - Continue home Wellbutrin - Continue home  trazodone - Continue home klonopin  Hypercholesterolemia: - Continue home simvastatin  DVT Ppx: Alexander heparin and SCDs  Diet: regular  Dispo: Disposition is deferred at this time, awaiting improvement of current medical problems. Anticipated discharge in approximately 1 day(s).   The patient does have a current PCP Julious Oka, MD) and does need an [facility] hospital follow-up appointment after discharge.  The patient does not have transportation limitations that hinder transportation to clinic appointments.  Signed: Drucilla Schmidt, MD 08/12/2014, 3:29 PM

## 2014-08-12 NOTE — Progress Notes (Signed)
1615 [facility] Will monitor for pt's arrival to unit. 1655 - pt arrived to unit per wheelchair with transport. No acute distress noted.  Nurse paged teaching services and informed of pt's arrival. Will monitor for orders  Andrew AuVafiadis, Serigne Kubicek I 08/12/2014 5:24 PM

## 2014-08-13 DIAGNOSIS — G43909 Migraine, unspecified, not intractable, without status migrainosus: Secondary | ICD-10-CM | POA: Diagnosis not present

## 2014-08-13 LAB — COMPREHENSIVE METABOLIC PANEL
ALT: 19 U/L (ref 0–35)
AST: 18 U/L (ref 0–37)
Albumin: 3.2 g/dL — ABNORMAL LOW (ref 3.5–5.2)
Alkaline Phosphatase: 74 U/L (ref 39–117)
Anion gap: 12 (ref 5–15)
BUN: 16 mg/dL (ref 6–23)
CHLORIDE: 106 meq/L (ref 96–112)
CO2: 25 mEq/L (ref 19–32)
Calcium: 8.7 mg/dL (ref 8.4–10.5)
Creatinine, Ser: 1.14 mg/dL — ABNORMAL HIGH (ref 0.50–1.10)
GFR calc Af Amer: 66 mL/min — ABNORMAL LOW (ref >90)
GFR calc non Af Amer: 57 mL/min — ABNORMAL LOW (ref >90)
Glucose, Bld: 95 mg/dL (ref 70–99)
POTASSIUM: 4 meq/L (ref 3.7–5.3)
SODIUM: 143 meq/L (ref 137–147)
Total Protein: 6.4 g/dL (ref 6.0–8.3)

## 2014-08-13 LAB — CBC
HCT: 38 % (ref 36.0–46.0)
Hemoglobin: 12.7 g/dL (ref 12.0–15.0)
MCH: 29 pg (ref 26.0–34.0)
MCHC: 33.4 g/dL (ref 30.0–36.0)
MCV: 86.8 fL (ref 78.0–100.0)
Platelets: 201 10*3/uL (ref 150–400)
RBC: 4.38 MIL/uL (ref 3.87–5.11)
RDW: 13.5 % (ref 11.5–15.5)
WBC: 7.7 10*3/uL (ref 4.0–10.5)

## 2014-08-13 MED ORDER — CYCLOBENZAPRINE HCL 5 MG PO TABS
5.0000 mg | ORAL_TABLET | Freq: Two times a day (BID) | ORAL | Status: DC
Start: 2014-08-13 — End: 2014-12-03

## 2014-08-13 MED ORDER — PROPRANOLOL HCL 80 MG PO TABS: 80.0000 mg | ORAL_TABLET | Freq: Three times a day (TID) | ORAL | Status: DC

## 2014-08-13 MED ORDER — SUMATRIPTAN SUCCINATE 25 MG PO TABS: 25.0000 mg | ORAL_TABLET | ORAL | Status: DC | PRN

## 2014-08-13 MED ORDER — SUMATRIPTAN SUCCINATE 25 MG PO TABS
25.0000 mg | ORAL_TABLET | Freq: Once | ORAL | Status: AC
  Administered 2014-08-13: 25 mg via ORAL
  Filled 2014-08-13: qty 1

## 2014-08-13 NOTE — Progress Notes (Signed)
UR completed 

## 2014-08-13 NOTE — Progress Notes (Signed)
Patient ID: [name], female   DOB: [birth year], 43 y.o.   MRN: [id number]        Attending progress note    Date of Admission:  08/12/2014     Principal Problem:   Migraine headache Active Problems:   Hypothyroidism   HYPERCHOLESTEROLEMIA, PURE   Bipolar disorder   Chronic low back pain   Insomnia   Vertigo   Diarrhea   Abdominal pain   AKI (acute kidney injury)   . ARIPiprazole  20 mg Oral Daily  . buPROPion  450 mg Oral Daily  . clonazePAM  1 mg Oral BID  . cyclobenzaprine  5 mg Oral BID  . gabapentin  300 mg Oral TID  . heparin  5,000 Units Subcutaneous 3 times per day  . levothyroxine  100 mcg Oral QAC breakfast  . liothyronine  5 mcg Oral Daily  . ondansetron  4 mg Oral Q6H  . pantoprazole  40 mg Oral Daily  . simvastatin  40 mg Oral q morning - 10a  . traZODone  100 mg Oral QHS    I had seen and examined [name] with my team today. She gives a history compatible bowl with recurrent, right-sided migraine headaches for the past 17 years. She states that she has them about twice monthly. He states that in the distant past she took sumatriptan for about 3 month without obvious benefit. It does not sound like she has ever been on any preventive medication or other evidence based treatments for acute exacerbations. She states that the only medicine that has consistently helped in the past is hydrocodone. She had been followed by Dr. Tommi EmeryAndy Kirstein for chronic low back pain. She last saw him in July. He made a point of talking with her about her high risk for opioid misuse and addiction and recommended treating only with tramadol. She tells us that she never wants to go back and see him. I think his best that we put her on preventive therapy with an agent such as a beta blocker and give her non-opioid options for treatment of acute headache. She has chronic, intermittent diarrhea which is probably not infectious.  Cliffton AstersJohn Linus Weckerly, MD [facility] [phone] pager   [phone] cell 08/13/2014, 12:27 PM

## 2014-08-13 NOTE — Progress Notes (Signed)
1830 - discharge instructions and prescription given to pt. Pt verbally acknowledged understanding. Pt voiced no questions when prompted. Pt has called mother for private transport by car to home. Will monitor.    Andrew AuVafiadis, Philopateer Strine I 08/13/2014 6:43 PM

## 2014-08-13 NOTE — Discharge Instructions (Signed)
You were started on two new medications for your migraine: Propranolol is to PREVENT migraines. You should take one pill (80 mg) three times per day for the next 4 weeks. Your primary doctor may increase this dose.  The sumatriptan is ABORTIVE therapy, to take ONCE A MIGRAINE BEGINS. You should only use this if you feel a migraine coming on.   Migraine Headache A migraine headache is an intense, throbbing pain on one or both sides of your head. A migraine can last for 30 minutes to several hours. CAUSES  The exact cause of a migraine headache is not always known. However, a migraine may be caused when nerves in the brain become irritated and release chemicals that cause inflammation. This causes pain. Certain things may also trigger migraines, such as:  Alcohol.  Smoking.  Stress.  Menstruation.  Aged cheeses.  Foods or drinks that contain nitrates, glutamate, aspartame, or tyramine.  Lack of sleep.  Chocolate.  Caffeine.  Hunger.  Physical exertion.  Fatigue.  Medicines used to treat chest pain (nitroglycerine), birth control pills, estrogen, and some blood pressure medicines. SIGNS AND SYMPTOMS  Pain on one or both sides of your head.  Pulsating or throbbing pain.  Severe pain that prevents daily activities.  Pain that is aggravated by any physical activity.  Nausea, vomiting, or both.  Dizziness.  Pain with exposure to bright lights, loud noises, or activity.  General sensitivity to bright lights, loud noises, or smells. Before you get a migraine, you may get warning signs that a migraine is coming (aura). An aura may include:  Seeing flashing lights.  Seeing bright spots, halos, or zigzag lines.  Having tunnel vision or blurred vision.  Having feelings of numbness or tingling.  Having trouble talking.  Having muscle weakness. DIAGNOSIS  A migraine headache is often diagnosed based on:  Symptoms.  Physical exam.  A CT scan or MRI of your  head. These imaging tests cannot diagnose migraines, but they can help rule out other causes of headaches. TREATMENT Medicines may be given for pain and nausea. Medicines can also be given to help prevent recurrent migraines.  HOME CARE INSTRUCTIONS  Only take over-the-counter or prescription medicines for pain or discomfort as directed by your health care provider. The use of long-term narcotics is not recommended.  Lie down in a dark, quiet room when you have a migraine.  Keep a journal to find out what may trigger your migraine headaches. For example, write down:  What you eat and drink.  How much sleep you get.  Any change to your diet or medicines.  Limit alcohol consumption.  Quit smoking if you smoke.  Get 7-9 hours of sleep, or as recommended by your health care provider.  Limit stress.  Keep lights dim if bright lights bother you and make your migraines worse. SEEK IMMEDIATE MEDICAL CARE IF:   Your migraine becomes severe.  You have a fever.  You have a stiff neck.  You have vision loss.  You have muscular weakness or loss of muscle control.  You start losing your balance or have trouble walking.  You feel faint or pass out.  You have severe symptoms that are different from your first symptoms. MAKE SURE YOU:   Understand these instructions.  Will watch your condition.  Will get help right away if you are not doing well or get worse. Document Released: 10/17/2005 Document Revised: 03/03/2014 Document Reviewed: 06/24/2013 [facility] Patient Information 2015 [employer] [place]. This information is not intended  to replace advice given to you by your health care provider. Make sure you discuss any questions you have with your health care provider. ° °

## 2014-08-13 NOTE — Progress Notes (Signed)
Pt transported to lobby per wheelchair with belongings in hand. No distress noted. Family member arrived at lobby with private car.  Andrew AuVafiadis, Lucca Greggs I 08/13/2014 6:56 PM

## 2014-08-13 NOTE — Progress Notes (Signed)
Subjective: Ms. Breed feels "fine" this morning. Her headache is still present, and continues to be similar to other "migraine headaches" she has experienced in the past. She has not had any more diarrhea. The diarrhea she does describe is intermittent. Occasionally, it is accompanied by pain that is relieved by defecating. The diarrhea does not wake her from sleep and has not affected her appetite.  When asked about recent stressors, she describes the difficulty she has been having with her son staying with her on the weekends; she believes this could have contributed to initiating her headache.  Objective: Vital signs in last 24 hours: Filed Vitals:   08/13/14 0000 08/13/14 0203 08/13/14 0600 08/13/14 0918  BP:  139/87 138/94 139/87  Pulse:  86 77 82  Temp:  98.1 F (36.7 C) 97.7 F (36.5 C) 98.4 F (36.9 C)  TempSrc:  Oral Oral Oral  Resp: _0 Height:      Weight:      SpO2:  94% 93% 97%   Weight change:   Intake/Output Summary (Last 24 hours) at 08/13/14 1307 Last data filed at 08/13/14 0919  Gross per 24 hour  Intake    240 ml  Output      0 ml  Net    240 ml   Physical Exam: Appearance: in NAD, sitting up in bed with empty breakfast tray in front of her HEENT: AT/[place], wearing glasses, PERRL, EOMi, tenderness to palpation in and around right temple  Heart: RRR, normal S1S2  Lungs: CTAB, no wheezes  Abdomen: BS+, soft, tender to deep palpation RLQ, otherwise nontender, no rebound tenderness  Extremities: no edema b/l LE  Neurologic: CN II-XII intact, strength 5/5 throughout, normal tone and bulk, sensation intact to light touch and pinprick, reflexes 2/4, FNF intact  Skin: SKs on back, otherwise no lesions or rashes    Lab Results: Basic Metabolic Panel:  Recent Labs Lab 08/12/14 1634 08/13/14 0631  NA 143 143  K 3.7 4.0  CL 105 106  CO2 25 25  GLUCOSE 114* 95  BUN 15 16  CREATININE 1.16* 1.14*  CALCIUM 9.2 8.7   Liver Function  Tests:  Recent Labs Lab 08/13/14 0631  AST 18  ALT 19  ALKPHOS 74  BILITOT <0.2*  PROT 6.4  ALBUMIN 3.2*   CBC:  Recent Labs Lab 08/12/14 1634 08/13/14 0631  WBC 8.7 7.7  NEUTROABS 5.7  --   HGB 13.2 12.7  HCT 38.8 38.0  MCV 86.6 86.8  PLT 223 201   Urine Drug Screen: Drugs of Abuse     Component Value Date/Time   LABOPIA NONE DETECTED 03/17/2013 2051   COCAINSCRNUR NONE DETECTED 03/17/2013 2051   LABBENZ NONE DETECTED 03/17/2013 2051   AMPHETMU POSITIVE* 03/17/2013 2051   THCU NONE DETECTED 03/17/2013 2051   LABBARB NONE DETECTED 03/17/2013 2051     Micro Results: No results found for this or any previous visit (from the past 240 hour(s)). Studies/Results: No results found. Medications: I have reviewed the patient's current medications. Scheduled Meds: . ARIPiprazole  20 mg Oral Daily  . buPROPion  450 mg Oral Daily  . clonazePAM  1 mg Oral BID  . cyclobenzaprine  5 mg Oral BID  . gabapentin  300 mg Oral TID  . heparin  5,000 Units Subcutaneous 3 times per day  . levothyroxine  100 mcg Oral QAC breakfast  . liothyronine  5 mcg Oral Daily  . ondansetron  4 mg Oral  Q6H  . pantoprazole  40 mg Oral Daily  . simvastatin  40 mg Oral q morning - 10a  . traZODone  100 mg Oral QHS   Continuous Infusions: . sodium chloride 75 mL/hr at 08/13/14 1159   PRN Meds:.acetaminophen Assessment/Plan: Principal Problem:   Migraine headache Active Problems:   Hypothyroidism   HYPERCHOLESTEROLEMIA, PURE   Bipolar disorder   Chronic low back pain   Insomnia   Vertigo   Diarrhea   Abdominal pain   AKI (acute kidney injury) Ms. Hoerner is a 43 yo woman with a history of migraines, diverticulitis and vertigo who was admitted from clinic with headaches for the past 6 days and diarrhea with abdominal pain for the past month. She has been given imitrex for her migraine pain and her diarrhea has subsided.  Migraine Headache: ESR normal. Symptoms are classic for migraine  headache (preceding visualization of spots that drift outward and last 1-3 minutes, 30 minutes of pain, photophobia, possible correlation with stress, and her family history of migraine in her sister). Other entities such as temporal arteritis are possible, given the location of the patient's headache, but the fact that her headaches always start at her right temple and the fact that her vision only blurs during the pain make this less likely; furthermore, she is very young to have an episode of TA. Because of the unlikelihood of this diagnosis, steroids were not initiated. Other forms of headache, such as tension-type headache or cluster are possible, but the time course is more consistent with chronic migraines. She also did not experience tearing. - Initiate sumatriptan (25 mg daily) to treat migraine and trend response; consider propranolol as outpatient for prevention. Pulse and BP are adequate to handle the addition of this mediation at 70s-80s and 130s/80s. - IVF NS 75 mL/hr   Diarrhea and Abdominal Pain: Intermittent diarrhea and pain for the past month. Though she has had the diagnosis of diverticulitis in the past, she is currently afebrile and does not have a white count. She also has no recent history of antibiotic use; the infrequency of current diarrhea, lack of signs of infection and fact that she has had no BMs while here make c difficile infection very unlikely. Her symptoms and the time course of her diarrhea make IBS most likely. The diarrhea does not wake her from sleep and has not affected her appetite. - Cancel c diff precautions - Continue with regular diet - Consider imaging if patient develops white count or fever   Vertigo: Patient has a history of vertigo and experienced some "dizziness" on Sunday when headache pain was at its worst.  - Zofran for any nausea PRN  - IVF NS 75 mL/hr   Mild AKI: Slightly raised creatinine 1.16-->1.14 (baseline 0.91)  - IVF NS 75 mL/hr  -  Continue to trend   Hypothyroidism:  - Levothyroxine and liothyronine as per home meds   GERD:  - Continue home Protonix   Chronic Pain:  - Continue home flexeril and neurontin for back and leg pain  - Patient visited Sherron Ales, MD for chronic back pain; he prescribed only tramadol and made a point of documenting her high risk for addiction and opioid misuse in his note; will proceed with non-opioid treatments at discharge  Insomnia:  - Continue home trazodone   Bipolar Disorder:  - Continue home Abilify  - Continue home Wellbutrin  - Continue home trazodone  - Continue home klonopin   Hypercholesterolemia:  - Continue home simvastatin  Dispo: Disposition is deferred at this time, awaiting improvement of current medical problems.  Anticipated discharge in approximately 0 day(s).   The patient does have a current PCP Julious Oka, MD) and does need an [facility] hospital follow-up appointment after discharge.  The patient does not have transportation limitations that hinder transportation to clinic appointments.  .Services Needed at time of discharge: Y = Yes, Blank = No PT:   OT:   RN:   Equipment:   Other:     LOS: 1 day   Drucilla Schmidt, MD 08/13/2014, 1:07 PM

## 2014-08-13 NOTE — Progress Notes (Signed)
INTERNAL MEDICINE TEACHING ATTENDING ADDENDUM - Rainah Kirshner, MD: I reviewed and discussed at the time of visit with the resident Dr. Glenn, the patient's medical history, physical examination, diagnosis and results of pertinent tests and treatment and I agree with the patient's care as documented.  

## 2014-08-16 NOTE — Discharge Summary (Signed)
Name: [name] MRN: [id number] DOB: Mar 09, 1971 43 y.o. PCP: Julious Oka, MD  Date of Admission: 08/12/2014  5:09 PM Date of Discharge: 08/16/2014 Attending Physician: No att. providers found  Discharge Diagnosis: Principal Problem:   Migraine headache Active Problems:   Hypothyroidism   HYPERCHOLESTEROLEMIA, PURE   Bipolar disorder   Chronic low back pain   Insomnia   Vertigo   Diarrhea   Abdominal pain   AKI (acute kidney injury)  Discharge Medications:   Medication List         acetaminophen 500 MG tablet  Commonly known as:  TYLENOL  Take 500 mg by mouth every 6 (six) hours as needed for mild pain or moderate pain.     ARIPiprazole 20 MG tablet  Commonly known as:  ABILIFY  Take 20 mg by mouth daily.     buPROPion 300 MG 24 hr tablet  Commonly known as:  WELLBUTRIN XL  Take 300 mg by mouth daily.     buPROPion 150 MG 24 hr tablet  Commonly known as:  WELLBUTRIN XL  Take 150 mg by mouth daily.     clonazePAM 1 MG tablet  Commonly known as:  KLONOPIN  Take 1 mg by mouth 2 (two) times daily.     cyclobenzaprine 5 MG tablet  Commonly known as:  FLEXERIL  Take 1 tablet (5 mg total) by mouth every 8 (eight) hours as needed for muscle spasms.     cyclobenzaprine 5 MG tablet  Commonly known as:  FLEXERIL  Take 1 tablet (5 mg total) by mouth 2 (two) times daily.     gabapentin 300 MG capsule  Commonly known as:  NEURONTIN  Take 300 mg by mouth 3 (three) times daily.     levothyroxine 100 MCG tablet  Commonly known as:  SYNTHROID, LEVOTHROID  Take 1 tablet (100 mcg total) by mouth daily before breakfast.     liothyronine 5 MCG tablet  Commonly known as:  CYTOMEL  Take 1 tablet (5 mcg total) by mouth daily.     meclizine 25 MG tablet  Commonly known as:  ANTIVERT  Take 1 tablet (25 mg total) by mouth 2 (two) times daily as needed for dizziness.     ondansetron 4 MG disintegrating tablet  Commonly known as:  ZOFRAN-ODT  Take 4 mg by mouth  daily as needed for nausea or vomiting.     ondansetron 4 MG tablet  Commonly known as:  ZOFRAN  Take 1 tablet (4 mg total) by mouth every 6 (six) hours.     pantoprazole 40 MG tablet  Commonly known as:  PROTONIX  Take 1 tablet (40 mg total) by mouth daily.     propranolol 80 MG tablet  Commonly known as:  INDERAL  Take 1 tablet (80 mg total) by mouth 3 (three) times daily.     protective barrier Crea  Apply 1 application topically daily as needed.     simvastatin 40 MG tablet  Commonly known as:  ZOCOR  Take 1 tablet (40 mg total) by mouth every morning.     SUMAtriptan 25 MG tablet  Commonly known as:  IMITREX  Take 1 tablet (25 mg total) by mouth every 2 (two) hours as needed for migraine or headache. May repeat in 2 hours if headache persists or recurs.     traMADol 50 MG tablet  Commonly known as:  ULTRAM  Take 1 tablet (50 mg total) by mouth every 6 (six) hours as needed (  pain).     traZODone 100 MG tablet  Commonly known as:  DESYREL  Take 1 tablet (100 mg total) by mouth at bedtime.        Disposition and follow-up:   [name] was discharged from [facility] in Stable condition.  At the hospital follow up visit please address:  1.  [name] was admitted for headache and diarrhea. Her headache had classic signs of migraine. She also has heightened stressors at home currently. By report, she has suffered from these headaches for 17 years and has been treating her pain with tramadol. She was started on sumatriptan and propranolol. Please assess her success on this new headache regimen.  Her diarrhea resolved (no episodes during her hospitalization). By history, it sounds like IBS.  2.  Labs / imaging needed at time of follow-up: none  3.  Pending labs/ test needing follow-up: none  Follow-up Appointments: Follow-up Information   Follow up with Julious Oka, MD On 08/18/2014. (10:45 AM)    Specialty:  Internal Medicine   Contact  information:   [place] [address] [phone]       Discharge Instructions:You were started on two new medications for your migraine: Propranolol is to PREVENT migraines. You should take one pill (80 mg) three times per day for the next 4 weeks. Your primary doctor may increase this dose.  The sumatriptan is ABORTIVE therapy, to take [name]. You should only use this if you feel a migraine coming on.   Migraine Headache A migraine headache is an intense, throbbing pain on one or both sides of your head. A migraine can last for 30 minutes to several hours. CAUSES  The exact cause of a migraine headache is not always known. However, a migraine may be caused when nerves in the brain become irritated and release chemicals that cause inflammation. This causes pain. Certain things may also trigger migraines, such as:  Alcohol.  Smoking.  Stress.  Menstruation.  Aged cheeses.  Foods or drinks that contain nitrates, glutamate, aspartame, or tyramine.  Lack of sleep.  Chocolate.  Caffeine.  Hunger.  Physical exertion.  Fatigue.  Medicines used to treat chest pain (nitroglycerine), birth control pills, estrogen, and some blood pressure medicines. SIGNS AND SYMPTOMS  Pain on one or both sides of your head.  Pulsating or throbbing pain.  Severe pain that prevents daily activities.  Pain that is aggravated by any physical activity.  Nausea, vomiting, or both.  Dizziness.  Pain with exposure to bright lights, loud noises, or activity.  General sensitivity to bright lights, loud noises, or smells. Before you get a migraine, you may get warning signs that a migraine is coming (aura). An aura may include:  Seeing flashing lights.  Seeing bright spots, halos, or zigzag lines.  Having tunnel vision or blurred vision.  Having feelings of numbness or tingling.  Having trouble talking.  Having muscle weakness. DIAGNOSIS  A migraine  headache is often diagnosed based on:  Symptoms.  Physical exam.  A CT scan or MRI of your head. These imaging tests cannot diagnose migraines, but they can help rule out other causes of headaches. TREATMENT Medicines may be given for pain and nausea. Medicines can also be given to help prevent recurrent migraines.  HOME CARE INSTRUCTIONS  Only take over-the-counter or prescription medicines for pain or discomfort as directed by your health care provider. The use of long-term narcotics is not recommended.  Shanda Howells  down in a dark, quiet room when you have a migraine.  Keep a journal to find out what may trigger your migraine headaches. For example, write down:  What you eat and drink.  How much sleep you get.  Any change to your diet or medicines.  Limit alcohol consumption.  Quit smoking if you smoke.  Get 7-9 hours of sleep, or as recommended by your health care provider.  Limit stress.  Keep lights dim if bright lights bother you and make your migraines worse. SEEK IMMEDIATE MEDICAL CARE IF:   Your migraine becomes severe.  You have a fever.  You have a stiff neck.  You have vision loss.  You have muscular weakness or loss of muscle control.  You start losing your balance or have trouble walking.  You feel faint or pass out.  You have severe symptoms that are different from your first symptoms. MAKE SURE YOU:   Understand these instructions.  Will watch your condition.  Will get help right away if you are not doing well or get worse. Document Released: 10/17/2005 Document Revised: 03/03/2014 Document Reviewed: 06/24/2013 [facility] Patient Information 2015 [place], [place]. This information is not intended to replace advice given to you by your health care provider. Make sure you discuss any questions you have with your health care provider.  Discharge Instructions   Diet - low sodium heart healthy    Complete by:  As directed      Increase activity slowly     Complete by:  As directed            Consultations:  none  Admission HPI:  Ms. Goren is a 43 yo woman with a PMH of chronic pain, bipolar disorder, insomnia, GERD, hypercholesterolemia, hypothyroidism and migraine headaches who was brought in from clinic for evaluation of her headache, abdominal pain and diarrhea.  Her headache began 6 days ago, came onand has "come and gone". It was at its worst on 3 days ago. She describes the pain as throbbing that shoots from her right temple to the back of her head; it lasts for about 30 minutes. It is occasionally accompanied by purple spots that last for 1-3 minutes before the onset of the pain; they drift outward. She has noticed some blurred vision in her eye b/l during the painful episodes; this blurred vision fully resolves with the resolution of the pain. At times, she has felt the room spinning during her painful episodes, along with some photophobia and occasional hand tingling bilaterally. At baseline, she suffers from migraine headaches; they typically present similarly, with pain in her right temple. Her pain typically radiates across her eyes like a mask. Of note, her son is visiting moved home and has been playing music loudly in the house; she has found this particularly stressful.  Her diarrhea and right lower abdominal pain started about a month ago. The diarrhea has occurred about 5x per week and has been nonbloody throughout. The RLQ pain that accompanies it alternates between stabbing and aching in quality. Of note, she had an episode of diverticulitis in the past that was treated with antibiotics.  She denies numbness, difficulty chewing, changes in appetite, weight loss, chills, subjective fevers, nausea, vomiting or sick contacts. Of note, she has had migraines for 17 years (they started with a pregnancy around that time); her sister also has migraines. She has had a hysterectomy and appendectomy.  Admission Physical Exam:  Blood pressure  176/101, pulse 84, temperature 98.8 F (37.1 C),  temperature source Oral, resp. rate 20, height 5' 5"  (1.651 m), weight 221 lb (100.245 kg).  Appearance: in NAD, lying in bed with lights out  HEENT: AT/[place], glasses in place, PERRL, EOMi, tenderness to palpation in and around right temple  Heart: RRR, normal S1S2  Lungs: CTAB, no wheezes  Abdomen: BS+, soft, tender to deep palpation RLQ, otherwise nontender, no rebound tenderness  Extremities: no edema b/l LE  Neurologic: CN II-XII intact, strength 5/5 throughout, normal tone and bulk, sensation intact to light touch and pinprick, reflexes 2/4, FNF intact  Skin: SKs on back, otherwise no lesions or rashes  Hospital Course by problem list: Principal Problem:   Migraine headache Active Problems:   Hypothyroidism   HYPERCHOLESTEROLEMIA, PURE   Bipolar disorder   Chronic low back pain   Insomnia   Vertigo   Abdominal pain   AKI (acute kidney injury)   Ms. Ehmann is a 43 yo woman with a history of migraines, diverticulitis and vertigo who was admitted from clinic with headaches for 6 days and diarrhea with abdominal pain for the past month. She was started on imitrex and propranolol for her migraine pain; her diarrhea and abdominal pain fully subsided.   Migraine Headache: ESR normal. Symptoms classic for migraine headache (preceding visualization of spots that drift outward and last 1-3 minutes, 30 minutes of pain, photophobia, possible correlation with stress, and family history of migraine in her sister). Other entities such as temporal arteritis are possible, given the location of the patient's headache, but the fact that her headaches always start at her right temple and the fact that her vision only blurs during the pain make this less likely; furthermore, she is very young to have an episode of TA. Because of the unlikelihood of this diagnosis, steroids were not initiated. Other forms of headache, such as tension-type headache or cluster  are possible, but the time course is more consistent with chronic migraines. She also did not experience tearing. She is not currently on medication for her migraines (states that she has not mentioned them to her PCP). Initiated sumatriptan (25 mg daily) to treat acute migraine and propranolol as outpatient for prevention. Pulse and BP are adequate to handle the addition of this mediation at 70s-80s and 130s/80s.   Diarrhea and Abdominal Pain: Intermittent diarrhea and pain for the past month. Though she has had the diagnosis of diverticulitis in the past, she is currently afebrile and does not have a white count. She also has no recent history of antibiotic use; the infrequency of current diarrhea, lack of signs of infection and fact that she has had no BMs while here make c difficile infection very unlikely. Her symptoms and the time course of her diarrhea make IBS most likely. The diarrhea does not wake her from sleep and has not affected her appetite. She did not have any episodes of diarrhea in the hospital and ate a regular, full diet.  Vertigo: Patient has a history of vertigo and experienced some "dizziness" 2 days prior to arrival when her headache pain was at its worst.   Mild, Resolving AKI: Slightly raised creatinine 1.16-->1.14 (baseline 0.91)   Hypothyroidism, GERD, chronic pain, insomnia, bipolar disorder and hypercholesterolemia were controlled on home meds.   Discharge Vitals:   BP 162/96  Pulse 85  Temp(Src) 97.9 F (36.6 C) (Oral)  Resp 20  Ht 5' 5"  (1.651 m)  Wt 221 lb (100.245 kg)  BMI 36.78 kg/m2  SpO2 95%  Signed: Drucilla Schmidt, MD  08/16/2014, 12:53 PM    Services Ordered on Discharge: none Equipment Ordered on Discharge: none

## 2014-08-18 ENCOUNTER — Ambulatory Visit: Payer: Self-pay | Admitting: Internal Medicine

## 2014-08-18 ENCOUNTER — Encounter: Payer: Self-pay | Admitting: Internal Medicine

## 2014-08-29 ENCOUNTER — Encounter: Payer: Self-pay | Admitting: Internal Medicine

## 2014-08-29 ENCOUNTER — Ambulatory Visit: Payer: Self-pay | Admitting: Internal Medicine

## 2014-09-01 ENCOUNTER — Encounter (HOSPITAL_COMMUNITY): Payer: Self-pay | Admitting: *Deleted

## 2014-09-08 ENCOUNTER — Emergency Department (HOSPITAL_COMMUNITY)
Admission: EM | Admit: 2014-09-08 | Discharge: 2014-09-08 | Disposition: A | Discharge status: Home / Self Care | Payer: Medicaid Other | Attending: Emergency Medicine | Admitting: Emergency Medicine

## 2014-09-08 ENCOUNTER — Encounter (HOSPITAL_COMMUNITY): Payer: Self-pay | Admitting: Emergency Medicine

## 2014-09-08 DIAGNOSIS — Z8742 Personal history of other diseases of the female genital tract: Secondary | ICD-10-CM | POA: Diagnosis not present

## 2014-09-08 DIAGNOSIS — F319 Bipolar disorder, unspecified: Secondary | ICD-10-CM | POA: Diagnosis not present

## 2014-09-08 DIAGNOSIS — Z79899 Other long term (current) drug therapy: Secondary | ICD-10-CM | POA: Insufficient documentation

## 2014-09-08 DIAGNOSIS — Z86718 Personal history of other venous thrombosis and embolism: Secondary | ICD-10-CM | POA: Insufficient documentation

## 2014-09-08 DIAGNOSIS — E039 Hypothyroidism, unspecified: Secondary | ICD-10-CM | POA: Insufficient documentation

## 2014-09-08 DIAGNOSIS — Z8619 Personal history of other infectious and parasitic diseases: Secondary | ICD-10-CM | POA: Insufficient documentation

## 2014-09-08 DIAGNOSIS — M549 Dorsalgia, unspecified: Secondary | ICD-10-CM | POA: Insufficient documentation

## 2014-09-08 DIAGNOSIS — M25511 Pain in right shoulder: Secondary | ICD-10-CM | POA: Diagnosis present

## 2014-09-08 DIAGNOSIS — Z720 Tobacco use: Secondary | ICD-10-CM | POA: Diagnosis not present

## 2014-09-08 DIAGNOSIS — G43909 Migraine, unspecified, not intractable, without status migrainosus: Secondary | ICD-10-CM | POA: Diagnosis not present

## 2014-09-08 LAB — BASIC METABOLIC PANEL
Anion gap: 12 (ref 5–15)
BUN: 18 mg/dL (ref 6–23)
CALCIUM: 9.6 mg/dL (ref 8.4–10.5)
CO2: 26 mEq/L (ref 19–32)
CREATININE: 1.17 mg/dL — AB (ref 0.50–1.10)
Chloride: 103 mEq/L (ref 96–112)
GFR calc Af Amer: 64 mL/min — ABNORMAL LOW (ref >90)
GFR calc non Af Amer: 55 mL/min — ABNORMAL LOW (ref >90)
GLUCOSE: 98 mg/dL (ref 70–99)
Potassium: 4.6 mEq/L (ref 3.7–5.3)
SODIUM: 141 meq/L (ref 137–147)

## 2014-09-08 LAB — I-STAT TROPONIN, ED: TROPONIN I, POC: 0.02 ng/mL (ref 0.00–0.08)

## 2014-09-08 LAB — CBC
HCT: 41.4 % (ref 36.0–46.0)
HEMOGLOBIN: 13.7 g/dL (ref 12.0–15.0)
MCH: 29.3 pg (ref 26.0–34.0)
MCHC: 33.1 g/dL (ref 30.0–36.0)
MCV: 88.5 fL (ref 78.0–100.0)
Platelets: 192 10*3/uL (ref 150–400)
RBC: 4.68 MIL/uL (ref 3.87–5.11)
RDW: 13.3 % (ref 11.5–15.5)
WBC: 8.9 10*3/uL (ref 4.0–10.5)

## 2014-09-08 MED ORDER — DIAZEPAM 5 MG PO TABS
5.0000 mg | ORAL_TABLET | Freq: Once | ORAL | Status: AC
  Administered 2014-09-08: 5 mg via ORAL
  Filled 2014-09-08: qty 1

## 2014-09-08 MED ORDER — HYDROCODONE-ACETAMINOPHEN 5-325 MG PO TABS: 1.0000 | ORAL_TABLET | ORAL | Status: DC | PRN

## 2014-09-08 MED ORDER — HYDROCODONE-ACETAMINOPHEN 5-325 MG PO TABS
1.0000 | ORAL_TABLET | Freq: Once | ORAL | Status: AC
  Administered 2014-09-08: 1 via ORAL
  Filled 2014-09-08: qty 1

## 2014-09-08 MED ORDER — CYCLOBENZAPRINE HCL 10 MG PO TABS: 10.0000 mg | ORAL_TABLET | Freq: Two times a day (BID) | ORAL | Status: DC | PRN

## 2014-09-08 NOTE — ED Provider Notes (Signed)
CSN: [id number]     Arrival date & time 09/08/14  0841 History   First MD Initiated Contact with Patient 09/08/14 [phone]     Chief Complaint  Patient presents with  . Shoulder Pain  . Back Pain  . Dizziness     (Consider location/radiation/quality/duration/timing/severity/associated sxs/prior Treatment) HPI Comments: This is a 43 year old female with a past medical history of DVT in 2012 (completed 1 year of Coumadin), migraines, hyperlipidemia, bipolar disorder and hypothyroidism who presents to the emergency department complaining of right shoulder pain radiating towards her back 1 day. Patient reports pain began while she was sitting in church and gradually worsened throughout the night into this morning becoming sharp and severe, worse when she looks towards the left or lays flat. She has not tried any alleviating factors for her symptoms. She admits to slight dizziness yesterday. Denies chest pain, shortness of breath, fever, chills, nausea or vomiting. Denies any heavy lifting or physical activity out of her normal. Denies hx of cardiac issues. Grandfather hx of early heart disease before the age of 45. Smoker.  Patient is a 43 y.o. female presenting with shoulder pain, back pain, and dizziness. The history is provided by the patient.  Shoulder Pain Associated symptoms: back pain   Back Pain Dizziness   Past Medical History  Diagnosis Date  . Deep venous thrombosis of leg 2012    completed year of coumadin   . Hx of measles   . Migraines   . Monilia infection 12/31/10  . Vaginal atrophy 03/04/11  . Dyspareunia 03/04/11  . Bipolar 1 disorder     Followed by Mental Health.  All psychiatric medications prescribed by Mental Health.  Stable for many years.    . Hypothyroidism     Hypothyroidism since 8th grade.  Managed by Dr. Sharl MaKerr, Endocrinology.  On synthroid.  No prior thyroid surgery/ablation.    Past Surgical History  Procedure Laterality Date  . Replacement total knee  2001   right knee  . Cesarean section  1998  . Tonsillectomy    . Wisdom tooth extraction    . Cholecystectomy    . Left elbow    . Abdominal hysterectomy  2000  . Appendectomy     Family History  Problem Relation Age of Onset  . Cancer Maternal Aunt    History  Substance Use Topics  . Smoking status: Current Every Day Smoker -- 0.40 packs/day    Types: Cigarettes    Last Attempt to Quit: 10/22/2012  . Smokeless tobacco: Never Used     Comment: 3 per day  . Alcohol Use: No   OB History    Gravida Para Term Preterm AB TAB SAB Ectopic Multiple Living   1 1 1       1      Review of Systems  Musculoskeletal: Positive for back pain.  Neurological: Positive for dizziness.   10 Systems reviewed and are negative for acute change except as noted in the HPI.   Allergies  Codeine; Meloxicam; Morphine and related; Naproxen; and Sulfonamide derivatives  Home Medications   Prior to Admission medications   Medication Sig Start Date End Date Taking? Authorizing Provider  acetaminophen (TYLENOL) 500 MG tablet Take 500 mg by mouth every 6 (six) hours as needed for mild pain or moderate pain.   Yes Historical Provider, MD  ARIPiprazole (ABILIFY) 20 MG tablet Take 20 mg by mouth daily.   Yes Historical Provider, MD  buPROPion (WELLBUTRIN XL) 150 MG 24 hr tablet  Take 150 mg by mouth daily.   Yes Historical Provider, MD  buPROPion (WELLBUTRIN XL) 300 MG 24 hr tablet Take 300 mg by mouth daily.   Yes Historical Provider, MD  gabapentin (NEURONTIN) 300 MG capsule Take 300 mg by mouth 3 (three) times daily. 07/22/14  Yes Gara Kroneriana Truong, MD  levothyroxine (SYNTHROID, LEVOTHROID) 100 MCG tablet Take 1 tablet (100 mcg total) by mouth daily before breakfast. 05/07/14  Yes Gara Kroneriana Truong, MD  liothyronine (CYTOMEL) 5 MCG tablet Take 1 tablet (5 mcg total) by mouth daily. 04/09/14  Yes Linward Headlandyan K Brown, MD  meclizine (ANTIVERT) 25 MG tablet Take 1 tablet (25 mg total) by mouth 2 (two) times daily as needed for  dizziness. 06/04/14  Yes Baltazar ApoSamaya J Qureshi, MD  ondansetron (ZOFRAN) 4 MG tablet Take 1 tablet (4 mg total) by mouth every 6 (six) hours. 07/20/14  Yes Heather Laisure, PA-C  ondansetron (ZOFRAN-ODT) 4 MG disintegrating tablet Take 4 mg by mouth daily as needed for nausea or vomiting.   Yes Historical Provider, MD  pantoprazole (PROTONIX) 40 MG tablet Take 1 tablet (40 mg total) by mouth daily. 04/10/14  Yes Linward Headlandyan K Brown, MD  propranolol (INDERAL) 80 MG tablet Take 1 tablet (80 mg total) by mouth 3 (three) times daily. 08/13/14  Yes Dionne AnoJulia Mallory, MD  protective barrier (RESTORE) CREA Apply 1 application topically daily as needed.    Yes Historical Provider, MD  simvastatin (ZOCOR) 40 MG tablet Take 1 tablet (40 mg total) by mouth every morning. 01/01/14  Yes Linward Headlandyan K Brown, MD  SUMAtriptan (IMITREX) 25 MG tablet Take 1 tablet (25 mg total) by mouth every 2 (two) hours as needed for migraine or headache. May repeat in 2 hours if headache persists or recurs. 08/13/14  Yes Dionne AnoJulia Mallory, MD  traMADol (ULTRAM) 50 MG tablet Take 1 tablet (50 mg total) by mouth every 6 (six) hours as needed (pain). 05/28/14  Yes Erick ColaceAndrew E Kirsteins, MD  traZODone (DESYREL) 100 MG tablet Take 1 tablet (100 mg total) by mouth at bedtime. 03/11/14  Yes Linward Headlandyan K Brown, MD  clonazePAM (KLONOPIN) 1 MG tablet Take 1 mg by mouth 2 (two) times daily.    Historical Provider, MD  cyclobenzaprine (FLEXERIL) 10 MG tablet Take 1 tablet (10 mg total) by mouth 2 (two) times daily as needed for muscle spasms. 09/08/14   Birgit Nowling M Kelley Polinsky, PA-C  cyclobenzaprine (FLEXERIL) 5 MG tablet Take 1 tablet (5 mg total) by mouth every 8 (eight) hours as needed for muscle spasms. 05/07/14   Gara Kroneriana Truong, MD  cyclobenzaprine (FLEXERIL) 5 MG tablet Take 1 tablet (5 mg total) by mouth 2 (two) times daily. 08/13/14 08/23/14  Dionne AnoJulia Mallory, MD  HYDROcodone-acetaminophen (NORCO/VICODIN) 5-325 MG per tablet Take 1-2 tablets by mouth every 4 (four) hours as needed. 09/08/14   Jaymarion Trombly  M Talton Delpriore, PA-C   BP 115/97 mmHg  Pulse 61  Resp 11  Ht 5\' 4"  (1.626 m)  Wt 210 lb (95.255 kg)  BMI 36.03 kg/m2  SpO2 95% Physical Exam  Constitutional: She is oriented to person, place, and time. She appears well-developed and well-nourished. No distress.  HENT:  Head: Normocephalic and atraumatic.  Mouth/Throat: Oropharynx is clear and moist.  Eyes: Conjunctivae and EOM are normal. Pupils are equal, round, and reactive to light.  Neck: Normal range of motion. Neck supple. No JVD present.  Cardiovascular: Normal rate, regular rhythm, normal heart sounds and intact distal pulses.   No extremity edema.  Pulmonary/Chest: Effort normal and  breath sounds normal. No respiratory distress.    Abdominal: Soft. Bowel sounds are normal. There is no tenderness.  Musculoskeletal: Normal range of motion. She exhibits no edema.  TTP anterior right shoulder. No swelling or deformity. FROM, pain noted. Pain to shoulder with left cervical lateral rotation. No rash.  Neurological: She is alert and oriented to person, place, and time. She has normal strength. No sensory deficit.  Speech fluent, goal oriented. Moves limbs without ataxia. Equal grip strength bilateral.  Skin: Skin is warm and dry. She is not diaphoretic.  Psychiatric: She has a normal mood and affect. Her behavior is normal.  Nursing note and vitals reviewed.   ED Course  Procedures (including critical care time) Labs Review Labs Reviewed  BASIC METABOLIC PANEL - Abnormal; Notable for the following:    Creatinine, Ser 1.17 (*)    GFR calc non Af Amer 55 (*)    GFR calc Af Amer 64 (*)    All other components within normal limits  CBC  I-STAT TROPOININ, ED    Imaging Review No results found.   EKG Interpretation   Date/Time:  Monday September 08 2014 08:57:03 EST Ventricular Rate:  59 PR Interval:  154 QRS Duration: 107 QT Interval:  423 QTC Calculation: 419 R Axis:   -2 Text Interpretation:  Sinus rhythm Non-specific  ST-t changes T wave  abnormality, consider anterior ischemia RESOLVED SINCE PREVIOUS Confirmed  by Juleen China  MD, STEPHEN ([id number]) on 09/08/2014 9:21:17 AM      MDM   Final diagnoses:  Right shoulder pain   Pt in NAD. VSS. Shoulder pain reproducible with palpation and movement. Doubt cardiac. HEART score 2. Low suspicion. Doubt PE. No SOB or chest pain. She does have hx of DVT in 2012. No DVT s/s. Very low suspicion. Labs without acute finding. EKG without acute changes. Pain improving with valium and vicodin. She has an appt with her PCP tomorrow. She is stable for d/c. Return precautions given. Patient states understanding of treatment care plan and is agreeable.  Discussed with attending Dr. Juleen China who agrees with plan of care.   Kathrynn Speed, PA-C 09/08/14 1023  Raeford Razor, MD 09/08/14 [phone]

## 2014-09-08 NOTE — Discharge Instructions (Signed)
Take Vicodin for severe pain only. No driving or operating heavy machinery while taking vicodin. This medication may cause drowsiness. No driving or operating heavy machinery while taking flexeril. This medication may make you drowsy. Rest, apply ice and heat intermittently.  Shoulder Pain The shoulder is the joint that connects your arms to your body. The bones that form the shoulder joint include the upper arm bone (humerus), the shoulder blade (scapula), and the collarbone (clavicle). The top of the humerus is shaped like a ball and fits into a rather flat socket on the scapula (glenoid cavity). A combination of muscles and strong, fibrous tissues that connect muscles to bones (tendons) support your shoulder joint and hold the ball in the socket. Small, fluid-filled sacs (bursae) are located in different areas of the joint. They act as cushions between the bones and the overlying soft tissues and help reduce friction between the gliding tendons and the bone as you move your arm. Your shoulder joint allows a wide range of motion in your arm. This range of motion allows you to do things like scratch your back or throw a ball. However, this range of motion also makes your shoulder more prone to pain from overuse and injury. Causes of shoulder pain can originate from both injury and overuse and usually can be grouped in the following four categories:  Redness, swelling, and pain (inflammation) of the tendon (tendinitis) or the bursae (bursitis).  Instability, such as a dislocation of the joint.  Inflammation of the joint (arthritis).  Broken bone (fracture). HOME CARE INSTRUCTIONS   Apply ice to the sore area.  Put ice in a plastic bag.  Place a towel between your skin and the bag.  Leave the ice on for 15-20 minutes, 3-4 times per day for the first 2 days, or as directed by your health care provider.  Stop using cold packs if they do not help with the pain.  If you have a shoulder sling or  immobilizer, wear it as long as your caregiver instructs. Only remove it to shower or bathe. Move your arm as little as possible, but keep your hand moving to prevent swelling.  Squeeze a soft ball or foam pad as much as possible to help prevent swelling.  Only take over-the-counter or prescription medicines for pain, discomfort, or fever as directed by your caregiver. SEEK MEDICAL CARE IF:   Your shoulder pain increases, or new pain develops in your arm, hand, or fingers.  Your hand or fingers become cold and numb.  Your pain is not relieved with medicines. SEEK IMMEDIATE MEDICAL CARE IF:   Your arm, hand, or fingers are numb or tingling.  Your arm, hand, or fingers are significantly swollen or turn white or blue. MAKE SURE YOU:   Understand these instructions.  Will watch your condition.  Will get help right away if you are not doing well or get worse. Document Released: 07/27/2005 Document Revised: 03/03/2014 Document Reviewed: 10/01/2011 [facility] Patient Information 2015 [place], [place]. This information is not intended to replace advice given to you by your health care provider. Make sure you discuss any questions you have with your health care provider.

## 2014-09-08 NOTE — ED Notes (Signed)
Pt reports right shoulder pain that radiates to back with dizziness onset yesterday. Pt denies shortness of breath, N/V, diaphoresis or recent injury.

## 2014-09-09 ENCOUNTER — Encounter: Payer: Self-pay | Admitting: Internal Medicine

## 2014-09-09 ENCOUNTER — Other Ambulatory Visit: Payer: Self-pay | Admitting: Internal Medicine

## 2014-09-09 ENCOUNTER — Ambulatory Visit (HOSPITAL_COMMUNITY)
Admission: RE | Admit: 2014-09-09 | Discharge: 2014-09-09 | Disposition: A | Discharge status: Home / Self Care | Payer: Medicaid Other | Source: Ambulatory Visit | Attending: Internal Medicine | Admitting: Internal Medicine

## 2014-09-09 ENCOUNTER — Ambulatory Visit (INDEPENDENT_AMBULATORY_CARE_PROVIDER_SITE_OTHER): Payer: Medicaid Other | Admitting: Internal Medicine

## 2014-09-09 VITALS — BP 99/72 | HR 65 | Temp 98.1°F | Ht 65.0 in | Wt 230.6 lb

## 2014-09-09 DIAGNOSIS — M25511 Pain in right shoulder: Secondary | ICD-10-CM | POA: Insufficient documentation

## 2014-09-09 DIAGNOSIS — M954 Acquired deformity of chest and rib: Secondary | ICD-10-CM | POA: Diagnosis not present

## 2014-09-09 DIAGNOSIS — G43909 Migraine, unspecified, not intractable, without status migrainosus: Secondary | ICD-10-CM

## 2014-09-09 DIAGNOSIS — R42 Dizziness and giddiness: Secondary | ICD-10-CM

## 2014-09-09 MED ORDER — MECLIZINE HCL 25 MG PO TABS: 25.0000 mg | ORAL_TABLET | Freq: Two times a day (BID) | ORAL | Status: DC | PRN

## 2014-09-09 MED ORDER — SUMATRIPTAN SUCCINATE 50 MG PO TABS: 50.0000 mg | ORAL_TABLET | Freq: Every day | ORAL | Status: DC | PRN

## 2014-09-09 MED ORDER — PROPRANOLOL HCL 80 MG PO TABS: 80.0000 mg | ORAL_TABLET | Freq: Three times a day (TID) | ORAL | Status: DC

## 2014-09-09 MED ORDER — SUMATRIPTAN SUCCINATE 25 MG PO TABS: 25.0000 mg | ORAL_TABLET | ORAL | Status: DC | PRN

## 2014-09-09 NOTE — Assessment & Plan Note (Signed)
Rx refill of Meclizine today

## 2014-09-09 NOTE — Addendum Note (Signed)
Addended by: Annett GulaMCLEAN, Zohaib Heeney N on: 09/09/2014 11:13 AM   Modules accepted: Orders, Level of Service

## 2014-09-09 NOTE — Progress Notes (Addendum)
   Subjective:    Patient ID: [name], female    DOB: 1971-05-20, 43 y.o.   MRN: [id number]  HPI Comments: 43 y.o female with chronic medical conditions   She presents for 1. ED f/u 09/08/14 for right shoulder/clavicle pain which started Sunday. She denies trauma.  Pain is throbbing 8/10 constant w/o radiation.  Pain is made worse with turning head right and lifting.  She tried heat/ice, Flexeril/Norco (has to take Zofran with pain medication) which has helped a little.  She may have slept the wrong way but denies trauma 2. Chronic back pain following with Dr. Cleophas DunkerWhitfield getting epidural injections 3. She needs Rx refills of Meclizine, Proproanolol and Imitrex       Review of Systems  Respiratory: Negative for shortness of breath.   Cardiovascular: Negative for chest pain.  Musculoskeletal: Positive for arthralgias.       Objective:   Physical Exam  Constitutional: She is oriented to person, place, and time. Vital signs are normal. She appears well-developed and well-nourished. She is cooperative. No distress.  HENT:  Head: Normocephalic and atraumatic.  Mouth/Throat: No oropharyngeal exudate.  Eyes: Conjunctivae are normal. Pupils are equal, round, and reactive to light. Right eye exhibits no discharge. Left eye exhibits no discharge. No scleral icterus.  Cardiovascular: Normal rate, regular rhythm, S1 normal, S2 normal and normal heart sounds.   No murmur heard. Pulmonary/Chest: Effort normal and breath sounds normal. No respiratory distress. She has no wheezes.  Musculoskeletal:       Right shoulder: She exhibits normal range of motion, no swelling, no crepitus and no deformity.       Arms: Mild Pain in areas with ROM  Neurological: She is alert and oriented to person, place, and time. Gait normal.  Skin: Skin is warm, dry and intact. No rash noted. She is not diaphoretic.  Psychiatric: She has a normal mood and affect. Her speech is normal and behavior is normal.  Judgment and thought content normal. Cognition and memory are normal.  Flat affect   Nursing note and vitals reviewed.         Assessment & Plan:  F/u in 1 month prn

## 2014-09-09 NOTE — Assessment & Plan Note (Addendum)
Rx refill of Inderal and Imitrex (pt not to exceed 200 mg in 1 day of Imitrex counseling though only needing to take 4 days/month and takes 2 pills in 1 day) Pt stated pharmacist rec increasing Imitrex 25 bid prn to 50 daily since her insurance will not cover more than 9 pills per month and she has to take 2. Changed Rx accordingly

## 2014-09-09 NOTE — Addendum Note (Signed)
Addended by: Annett GulaMCLEAN, Elham Fini N on: 09/09/2014 02:44 PM   Modules accepted: Level of Service

## 2014-09-09 NOTE — Assessment & Plan Note (Addendum)
Etiology likely MSK as relieved by Flexeril, other ddx includes arthritis less likely fracture  Will do Xray right shoulder/clavicle to w/u etiology   -results of Xray Right shoulder is unremarkable. No evidence of fracture,dislocation, or separation. Deformity of the anterior aspect of the right first rib. This may just represent prominent costosternal calcification. Right rib series is suggested to exclude right anterior first rib lesion   Continue Flexeril, Norco/vicodin, heat/ice, if can tolerate an NSAID may want to try as well  If not resolved in 1 month [facility] and be referred to [facility] clinic (alreading seeing Dr. Cleophas DunkerWhitfield)

## 2014-09-09 NOTE — Patient Instructions (Addendum)
General Instructions: Continue heat, ice, Flexeril, Norco, you can try an Ibuprofen if you can tolerate it  Follow up in 1 month if not improved we may need to send you to sports Medicine  Get an Xray  Take care    Treatment Goals:  Goals (1 Years of Data) as of 09/09/14    None      Progress Toward Treatment Goals:  Treatment Goal 03/11/2014  Stop smoking smoking the same amount  Prevent falls improved    Self Care Goals & Plans:  Self Care Goal 09/09/2014  Manage my medications take my medicines as prescribed; bring my medications to every visit; refill my medications on time; follow the sick day instructions if I am sick  Monitor my health keep track of my blood pressure  Eat healthy foods drink diet soda or water instead of juice or soda; eat more vegetables; eat foods that are low in salt; eat baked foods instead of fried foods; eat fruit for snacks and desserts; eat smaller portions  Be physically active -  Prevent falls -  Meeting treatment goals -    No flowsheet data found.   Care Management & Community Referrals:  Referral 09/09/2014  Referrals made for care management support none needed  Referrals made to community resources none       Shoulder Pain The shoulder is the joint that connects your arms to your body. The bones that form the shoulder joint include the upper arm bone (humerus), the shoulder blade (scapula), and the collarbone (clavicle). The top of the humerus is shaped like a ball and fits into a rather flat socket on the scapula (glenoid cavity). A combination of muscles and strong, fibrous tissues that connect muscles to bones (tendons) support your shoulder joint and hold the ball in the socket. Small, fluid-filled sacs (bursae) are located in different areas of the joint. They act as cushions between the bones and the overlying soft tissues and help reduce friction between the gliding tendons and the bone as you move your arm. Your shoulder joint  allows a wide range of motion in your arm. This range of motion allows you to do things like scratch your back or throw a ball. However, this range of motion also makes your shoulder more prone to pain from overuse and injury. Causes of shoulder pain can originate from both injury and overuse and usually can be grouped in the following four categories:  Redness, swelling, and pain (inflammation) of the tendon (tendinitis) or the bursae (bursitis).  Instability, such as a dislocation of the joint.  Inflammation of the joint (arthritis).  Broken bone (fracture). HOME CARE INSTRUCTIONS   Apply ice to the sore area.  Put ice in a plastic bag.  Place a towel between your skin and the bag.  Leave the ice on for 15-20 minutes, 3-4 times per day for the first 2 days, or as directed by your health care provider.  Stop using cold packs if they do not help with the pain.  If you have a shoulder sling or immobilizer, wear it as long as your caregiver instructs. Only remove it to shower or bathe. Move your arm as little as possible, but keep your hand moving to prevent swelling.  Squeeze a soft ball or foam pad as much as possible to help prevent swelling.  Only take over-the-counter or prescription medicines for pain, discomfort, or fever as directed by your caregiver. SEEK MEDICAL CARE IF:   Your shoulder pain increases,  or new pain develops in your arm, hand, or fingers.  Your hand or fingers become cold and numb.  Your pain is not relieved with medicines. SEEK IMMEDIATE MEDICAL CARE IF:   Your arm, hand, or fingers are numb or tingling.  Your arm, hand, or fingers are significantly swollen or turn white or blue. MAKE SURE YOU:   Understand these instructions.  Will watch your condition.  Will get help right away if you are not doing well or get worse. Document Released: 07/27/2005 Document Revised: 03/03/2014 Document Reviewed: 10/01/2011 [facility]Exit[facility] Patient Information 2015  [place], [place]. This information is not intended to replace advice given to you by your health care provider. Make sure you discuss any questions you have with your health care provider.

## 2014-09-10 NOTE — Progress Notes (Signed)
Internal Medicine Clinic Attending  Case discussed with Dr. McLean soon after the resident saw the patient.  We reviewed the resident's history and exam and pertinent patient test results.  I agree with the assessment, diagnosis, and plan of care documented in the resident's note. 

## 2014-09-12 ENCOUNTER — Telehealth: Payer: Self-pay | Admitting: Internal Medicine

## 2014-09-12 ENCOUNTER — Encounter: Payer: Self-pay | Admitting: Internal Medicine

## 2014-09-12 ENCOUNTER — Ambulatory Visit (HOSPITAL_COMMUNITY)
Admission: RE | Admit: 2014-09-12 | Discharge: 2014-09-12 | Disposition: A | Discharge status: Home / Self Care | Payer: Medicaid Other | Source: Ambulatory Visit | Attending: Internal Medicine | Admitting: Internal Medicine

## 2014-09-12 DIAGNOSIS — R0781 Pleurodynia: Secondary | ICD-10-CM | POA: Diagnosis not present

## 2014-09-12 DIAGNOSIS — M25511 Pain in right shoulder: Secondary | ICD-10-CM

## 2014-09-12 NOTE — Telephone Encounter (Signed)
   Reason for call:   I received a call from [name] at 8:30 PM requesting the results of her rib imaging completed earlier today.   Pertinent Data:   No evidence of acute cardiopulmonary disease.  No displaced right rib fracture is seen.   Assessment / Plan / Recommendations:   [name] was informed of these results.   She will try conservative treatment recommended at last clinic visit and return to clinic for Sports Medicine referral if pain persists.   Yolanda MangesAlex M Laekyn Rayos, DO   09/12/2014, 8:52 PM

## 2014-10-21 ENCOUNTER — Ambulatory Visit: Payer: Self-pay | Admitting: Internal Medicine

## 2014-10-22 ENCOUNTER — Ambulatory Visit (INDEPENDENT_AMBULATORY_CARE_PROVIDER_SITE_OTHER): Payer: Medicaid Other | Admitting: Internal Medicine

## 2014-10-22 ENCOUNTER — Encounter: Payer: Self-pay | Admitting: Internal Medicine

## 2014-10-22 VITALS — BP 130/81 | HR 64 | Temp 98.0°F | Ht 65.0 in | Wt 238.9 lb

## 2014-10-22 DIAGNOSIS — M545 Low back pain, unspecified: Secondary | ICD-10-CM

## 2014-10-22 DIAGNOSIS — G8929 Other chronic pain: Secondary | ICD-10-CM

## 2014-10-22 DIAGNOSIS — G43909 Migraine, unspecified, not intractable, without status migrainosus: Secondary | ICD-10-CM

## 2014-10-22 DIAGNOSIS — Z0000 Encounter for general adult medical examination without abnormal findings: Secondary | ICD-10-CM

## 2014-10-22 DIAGNOSIS — Z720 Tobacco use: Secondary | ICD-10-CM

## 2014-10-22 MED ORDER — PROPRANOLOL HCL 80 MG PO TABS: 80.0000 mg | ORAL_TABLET | Freq: Three times a day (TID) | ORAL | Status: DC

## 2014-10-22 MED ORDER — TRAMADOL HCL 50 MG PO TABS: 50.0000 mg | ORAL_TABLET | Freq: Four times a day (QID) | ORAL | Status: DC | PRN

## 2014-10-22 MED ORDER — CYCLOBENZAPRINE HCL 5 MG PO TABS: 5.0000 mg | ORAL_TABLET | Freq: Three times a day (TID) | ORAL | Status: DC | PRN

## 2014-10-22 NOTE — Assessment & Plan Note (Signed)
Rx refill Inderall 80 mg tid

## 2014-10-22 NOTE — Assessment & Plan Note (Signed)
Encouraged pt to f/u with Dr. Sharl MaKerr

## 2014-10-22 NOTE — Patient Instructions (Addendum)
Please establish care for chronic back pain with Dr. Alvester MorinNewton  I will give you short term supply of Flexeril and Tramadol today Continue back exercises  Takes care   General Instructions:    Treatment Goals:  Goals (1 Years of Data) as of 10/22/14    None      Progress Toward Treatment Goals:  Treatment Goal 10/22/2014  Stop smoking smoking the same amount  Prevent falls -    Self Care Goals & Plans:  Self Care Goal 10/22/2014  Manage my medications take my medicines as prescribed; bring my medications to every visit; refill my medications on time  Monitor my health keep track of my blood pressure  Eat healthy foods drink diet soda or water instead of juice or soda; eat more vegetables; eat foods that are low in salt; eat baked foods instead of fried foods; eat fruit for snacks and desserts; eat smaller portions  Be physically active find an activity I enjoy  Stop smoking call [facility] (1-800-QUIT-NOW)  Prevent falls -  Meeting treatment goals maintain the current self-care plan    No flowsheet data found.   Care Management & Community Referrals:  Referral 10/22/2014  Referrals made for care management support none needed  Referrals made to community resources none        Back Pain, Adult Low back pain is very common. About 1 in 5 people have back pain.The cause of low back pain is rarely dangerous. The pain often gets better over time.About half of people with a sudden onset of back pain feel better in just 2 weeks. About 8 in 10 people feel better by 6 weeks.  CAUSES Some common causes of back pain include:  Strain of the muscles or ligaments supporting the spine.  Wear and tear (degeneration) of the spinal discs.  Arthritis.  Direct injury to the back. DIAGNOSIS Most of the time, the direct cause of low back pain is not known.However, back pain can be treated effectively even when the exact cause of the pain is unknown.Answering your caregiver's  questions about your overall health and symptoms is one of the most accurate ways to make sure the cause of your pain is not dangerous. If your caregiver needs more information, he or she may order lab work or imaging tests (X-rays or MRIs).However, even if imaging tests show changes in your back, this usually does not require surgery. HOME CARE INSTRUCTIONS For many people, back pain returns.Since low back pain is rarely dangerous, it is often a condition that people can learn to [facility] their own.   Remain active. It is stressful on the back to sit or stand in one place. Do not sit, drive, or stand in one place for more than 30 minutes at a time. Take short walks on level surfaces as soon as pain allows.Try to increase the length of time you walk each day.  Do not stay in bed.Resting more than 1 or 2 days can delay your recovery.  Do not avoid exercise or work.Your body is made to move.It is not dangerous to be active, even though your back may hurt.Your back will likely heal faster if you return to being active before your pain is gone.  Pay attention to your body when you bend and lift. Many people have less discomfortwhen lifting if they bend their knees, keep the load close to their bodies,and avoid twisting. Often, the most comfortable positions are those that put less stress on your recovering back.  Find  a comfortable position to sleep. Use a firm mattress and lie on your side with your knees slightly bent. If you lie on your back, put a pillow under your knees.  Only take over-the-counter or prescription medicines as directed by your caregiver. Over-the-counter medicines to reduce pain and inflammation are often the most helpful.Your caregiver may prescribe muscle relaxant drugs.These medicines help dull your pain so you can more quickly return to your normal activities and healthy exercise.  Put ice on the injured area.  Put ice in a plastic bag.  Place a towel between  your skin and the bag.  Leave the ice on for 15-20 minutes, 03-04 times a day for the first 2 to 3 days. After that, ice and heat may be alternated to reduce pain and spasms.  Ask your caregiver about trying back exercises and gentle massage. This may be of some benefit.  Avoid feeling anxious or stressed.Stress increases muscle tension and can worsen back pain.It is important to recognize when you are anxious or stressed and learn ways to manage it.Exercise is a great option. SEEK MEDICAL CARE IF:  You have pain that is not relieved with rest or medicine.  You have pain that does not improve in 1 week.  You have new symptoms.  You are generally not feeling well. SEEK IMMEDIATE MEDICAL CARE IF:   You have pain that radiates from your back into your legs.  You develop new bowel or bladder control problems.  You have unusual weakness or numbness in your arms or legs.  You develop nausea or vomiting.  You develop abdominal pain.  You feel faint. Document Released: 10/17/2005 Document Revised: 04/17/2012 Document Reviewed: 02/18/2014 [facility] Patient Information 2015 [place], [place]. This information is not intended to replace advice given to you by your health care provider. Make sure you discuss any questions you have with your health care provider. Smoking Cessation Quitting smoking is important to your health and has many advantages. However, it is not always easy to quit since nicotine is a very addictive drug. Oftentimes, people try 3 times or more before being able to quit. This document explains the best ways for you to prepare to quit smoking. Quitting takes hard work and a lot of effort, but you can do it. ADVANTAGES OF QUITTING SMOKING  You will live longer, feel better, and live better.  Your body will feel the impact of quitting smoking almost immediately.  Within 20 minutes, blood pressure decreases. Your pulse returns to its normal level.  After 8 hours, carbon  monoxide levels in the blood return to normal. Your oxygen level increases.  After 24 hours, the chance of having a heart attack starts to decrease. Your breath, hair, and body stop smelling like smoke.  After 48 hours, damaged nerve endings begin to recover. Your sense of taste and smell improve.  After 72 hours, the body is virtually free of nicotine. Your bronchial tubes relax and breathing becomes easier.  After 2 to 12 weeks, lungs can hold more air. Exercise becomes easier and circulation improves.  The risk of having a heart attack, stroke, cancer, or lung disease is greatly reduced.  After 1 year, the risk of coronary heart disease is cut in half.  After 5 years, the risk of stroke falls to the same as a nonsmoker.  After 10 years, the risk of lung cancer is cut in half and the risk of other cancers decreases significantly.  After 15 years, the risk of coronary heart  disease drops, usually to the level of a nonsmoker.  If you are pregnant, quitting smoking will improve your chances of having a healthy baby.  The people you live with, especially any children, will be healthier.  You will have extra money to spend on things other than cigarettes. QUESTIONS TO THINK ABOUT BEFORE ATTEMPTING TO QUIT You may want to talk about your answers with your health care provider.  Why do you want to quit?  If you tried to quit in the past, what helped and what did not?  What will be the most difficult situations for you after you quit? How will you plan to handle them?  Who can help you through the tough times? Your family? Friends? A health care provider?  What pleasures do you get from smoking? What ways can you still get pleasure if you quit? Here are some questions to ask your health care provider:  How can you help me to be successful at quitting?  What medicine do you think would be best for me and how should I take it?  What should I do if I need more help?  What is  smoking withdrawal like? How can I get information on withdrawal? GET READY  Set a quit date.  Change your environment by getting rid of all cigarettes, ashtrays, matches, and lighters in your home, car, or work. Do not let people smoke in your home.  Review your past attempts to quit. Think about what worked and what did not. GET SUPPORT AND ENCOURAGEMENT You have a better chance of being successful if you have help. You can get support in many ways.  Tell your family, friends, and coworkers that you are going to quit and need their support. Ask them not to smoke around you.  Get individual, group, or telephone counseling and support. Programs are available at [employer] and health centers. Call your local health department for information about programs in your area.  Spiritual beliefs and practices may help some smokers quit.  Download a "quit meter" on your computer to keep track of quit statistics, such as how long you have gone without smoking, cigarettes not smoked, and money saved.  Get a self-help book about quitting smoking and staying off tobacco. LEARN NEW SKILLS AND BEHAVIORS  Distract yourself from urges to smoke. Talk to someone, go for a walk, or occupy your time with a task.  Change your normal routine. Take a different route to work. Drink tea instead of coffee. Eat breakfast in a different place.  Reduce your stress. Take a hot bath, exercise, or read a book.  Plan something enjoyable to do every day. Reward yourself for not smoking.  Explore interactive web-based programs that specialize in helping you quit. GET MEDICINE AND USE IT CORRECTLY Medicines can help you stop smoking and decrease the urge to smoke. Combining medicine with the above behavioral methods and support can greatly increase your chances of successfully quitting smoking.  Nicotine replacement therapy helps deliver nicotine to your body without the negative effects and risks of smoking.  Nicotine replacement therapy includes nicotine gum, lozenges, inhalers, nasal sprays, and skin patches. Some may be available over-the-counter and others require a prescription.  Antidepressant medicine helps people abstain from smoking, but how this works is unknown. This medicine is available by prescription.  Nicotinic receptor partial agonist medicine simulates the effect of nicotine in your brain. This medicine is available by prescription. Ask your health care provider for advice about which medicines  to use and how to use them based on your health history. Your health care provider will tell you what side effects to look out for if you choose to be on a medicine or therapy. Carefully read the information on the package. Do not use any other product containing nicotine while using a nicotine replacement product.  RELAPSE OR DIFFICULT SITUATIONS Most relapses occur within the first 3 months after quitting. Do not be discouraged if you start smoking again. Remember, most people try several times before finally quitting. You may have symptoms of withdrawal because your body is used to nicotine. You may crave cigarettes, be irritable, feel very hungry, cough often, get headaches, or have difficulty concentrating. The withdrawal symptoms are only temporary. They are strongest when you first quit, but they will go away within 10-14 days. To reduce the chances of relapse, try to:  Avoid drinking alcohol. Drinking lowers your chances of successfully quitting.  Reduce the amount of caffeine you consume. Once you quit smoking, the amount of caffeine in your body increases and can give you symptoms, such as a rapid heartbeat, sweating, and anxiety.  Avoid smokers because they can make you want to smoke.  Do not let weight gain distract you. Many smokers will gain weight when they quit, usually less than 10 pounds. Eat a healthy diet and stay active. You can always lose the weight gained after you  quit.  Find ways to improve your mood other than smoking. FOR MORE INFORMATION  www.smokefree.gov  Document Released: 10/11/2001 Document Revised: 03/03/2014 Document Reviewed: 01/26/2012 [facility] Patient Information 2015 [place]. This information is not intended to replace advice given to you by your health care provider. Make sure you discuss any questions you have with your health care provider.

## 2014-10-22 NOTE — Assessment & Plan Note (Signed)
See HPI on MRI results reviewed Will give short term 1 month Tramadol and Flexeril supply Refer to Dr. Alvester MorinNewton (ortho) for chronic back pain management Encouraged pt to also try exercises to help back pain previously given by PM&R

## 2014-10-22 NOTE — Assessment & Plan Note (Signed)
S/p total hysterectomy follows with OB/GYN

## 2014-10-22 NOTE — Progress Notes (Signed)
Case discussed with Dr. McLean at the time of the visit.  We reviewed the resident's history and exam and pertinent patient test results.  I agree with the assessment, diagnosis and plan of care documented in the resident's note. 

## 2014-10-22 NOTE — Assessment & Plan Note (Signed)
Counseled on cessation 

## 2014-10-22 NOTE — Progress Notes (Signed)
   Subjective:    Patient ID: [name], female    DOB: August 03, 1971, 43 y.o.   MRN: [id number]  HPI Comments: 43 y.o with chronic medical problems and chronic pain  She presents for. BP 130/81 today 1. Chronic back pain-she has seen Dr. Cleophas DunkerWhitfield in the past and had epidural injections by Dr. Alvester MorinNewton [facility]) as well as PM&R Dr. Wynn BankerKirsteins who felt like pt had high risk opiates and would only Rx Tramadol in 05/28/14 gave #120 of Tramdol and rec exercises and wt loss.  She presents today for back pain and Rx refill Flexeril and Tramadol which helped.  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  She states back pain is low back and radiates laterally and not down her legs. Pain is 8/10.  She has tried to exercise to lose wt but still has not loss weight and becomes tearful.  She no longer wishes to see Dr. Cleophas DunkerWhitfield or Kirsteins b/c she did not like their bedside manner or the way they talked to her.      2. Migraines controlled with Inderal 80 mg tid needs Rx refill.    HM -due for pap per epic but follows with Dr. Dierdre ForthVanessa Haygood and is s/p total hysterectomy but states had pap recently and will get records, Tdap         Review of Systems  Respiratory: Negative for shortness of breath.   Cardiovascular: Negative for chest pain.  Gastrointestinal: Negative for constipation.  Musculoskeletal: Positive for back pain.       Objective:   Physical Exam  Constitutional: She is oriented to person, place, and time. Vital signs are normal. She appears well-developed and well-nourished. She is cooperative. No distress.  HENT:  Head: Normocephalic and atraumatic.  Mouth/Throat: She has dentures. No oropharyngeal exudate.  Eyes: Conjunctivae are normal. Right eye exhibits no discharge. Left eye exhibits no discharge. No scleral icterus.  Cardiovascular: Normal rate, regular rhythm, S1 normal, S2 normal and normal heart sounds.     No murmur heard. Pulmonary/Chest: Effort normal and breath sounds normal. No respiratory distress. She has no wheezes.  Abdominal: Soft. Bowel sounds are normal. There is no tenderness.  Musculoskeletal:       Arms: Neurological: She is alert and oriented to person, place, and time. Gait normal.  Skin: Skin is warm, dry and intact. No rash noted. She is not diaphoretic.  Psychiatric: She has a normal mood and affect. Her speech is normal and behavior is normal. Judgment and thought content normal. Cognition and memory are normal.  Nursing note and vitals reviewed.         Assessment & Plan:  F/u prn  Will refer to Dr. Jenne CampusNewton [facility] for chronic back pain management for now

## 2014-11-01 ENCOUNTER — Telehealth: Payer: Self-pay | Admitting: Internal Medicine

## 2014-11-01 NOTE — Telephone Encounter (Signed)
  INTERNAL MEDICINE RESIDENCY PROGRAM After-Hours Telephone Call    Reason for call:   I received a call from Ms. Liston Alba at 5:57 PM, 11/01/2014 indicating that she is having a sore throat and rhinorrhea.    Pertinent Data:   Patient does indicated that she is having some congestion, rhinorrhea, and significantly sore throat.   States she is also having a fever (101 F oral) and chills.   Denies cough, nausea, vomiting, diarrhea, dizziness, lightheadedness, or headaches.   Asking for Rx for "Tamiflu".    Assessment / Plan / Recommendations:   Patient most likely w/ viral upper respiratory tract infection/pharyngitis, however, does describe symptoms suggestive of strep throat. Given absence of cough, less likely to be flu.   Instructed patient to take tylenol 500 mg q6h prn for fever/chills. Also advised her to stay hydrated.   Can take Cepacol for sore throat.   Instructed the patient to come to urgent care/ED for further workup for strep throat given the presence of fever. Says she will go in the AM, or this evening if her fever persists.     Courtney Paris, MD   11/01/2014, 5:57 PM

## 2014-11-08 ENCOUNTER — Telehealth: Payer: Self-pay | Admitting: Internal Medicine

## 2014-11-08 NOTE — Telephone Encounter (Signed)
   Reason for call:   I received a call from [name] at 9:30 PM on 11/07/13 indicating she was having abdominal cramping that started one day ago in the middle of the night and felt like someone had kicked her in the stomach. She reports the pain is in the lower part of her abdomen and is constant 7/10 in intensity with no associated fever, chills, nausea, vomiting, diarrhea, constipation, urinary symptoms, flank/back pain, or GI/GU bleeding. She is able to take normal PO intake. She is s/p hysterectomy, cholecystectomy, and appendectomy and has a history of nephrolithiasis, mild diverticulosis, and UTI. She has been taking advil and tylenol with mild relief. She reports her current symptoms feel like a UTI she has had in the past.    Pertinent Data:   She was seen at IM clinic on 10/22/14 and was doing well with no complaints. CT renal stone imaging on 07/20/14 revealed mild diverticulosis.    Assessment / Plan / Recommendations:   It appears [name]'s lower abdominal pain (suprapubic pain?) may be due to symptomatic UTI as she reports her symptoms are similar to prior UTI. Obstructive nephrolithiasis, pyelonephritis, diverticulitis, and pancreatitis seem less likely. I advised her to take tramadol which she has at home for chronic low back pain and repeat as needed for the pain. She was advised to go to the ED tonight or tomorrow for further evaluation if her symptoms persist.      Otis BraceMarjan Tomi Paddock, MD   11/08/2014, 8:20 AM

## 2014-11-28 ENCOUNTER — Emergency Department (HOSPITAL_COMMUNITY)
Admission: EM | Admit: 2014-11-28 | Discharge: 2014-11-28 | Disposition: A | Discharge status: Home / Self Care | Payer: Medicaid Other | Attending: Emergency Medicine | Admitting: Emergency Medicine

## 2014-11-28 ENCOUNTER — Encounter (HOSPITAL_COMMUNITY): Payer: Self-pay | Admitting: Physical Medicine and Rehabilitation

## 2014-11-28 ENCOUNTER — Emergency Department (HOSPITAL_COMMUNITY): Payer: Medicaid Other

## 2014-11-28 DIAGNOSIS — Z79899 Other long term (current) drug therapy: Secondary | ICD-10-CM | POA: Diagnosis not present

## 2014-11-28 DIAGNOSIS — M545 Low back pain: Secondary | ICD-10-CM | POA: Diagnosis present

## 2014-11-28 DIAGNOSIS — Z8742 Personal history of other diseases of the female genital tract: Secondary | ICD-10-CM | POA: Diagnosis not present

## 2014-11-28 DIAGNOSIS — G43909 Migraine, unspecified, not intractable, without status migrainosus: Secondary | ICD-10-CM | POA: Diagnosis not present

## 2014-11-28 DIAGNOSIS — M791 Myalgia, unspecified site: Secondary | ICD-10-CM

## 2014-11-28 DIAGNOSIS — Z8619 Personal history of other infectious and parasitic diseases: Secondary | ICD-10-CM | POA: Insufficient documentation

## 2014-11-28 DIAGNOSIS — E039 Hypothyroidism, unspecified: Secondary | ICD-10-CM | POA: Insufficient documentation

## 2014-11-28 DIAGNOSIS — Z720 Tobacco use: Secondary | ICD-10-CM | POA: Insufficient documentation

## 2014-11-28 DIAGNOSIS — Z86718 Personal history of other venous thrombosis and embolism: Secondary | ICD-10-CM | POA: Insufficient documentation

## 2014-11-28 DIAGNOSIS — M549 Dorsalgia, unspecified: Secondary | ICD-10-CM

## 2014-11-28 DIAGNOSIS — F319 Bipolar disorder, unspecified: Secondary | ICD-10-CM | POA: Diagnosis not present

## 2014-11-28 DIAGNOSIS — R52 Pain, unspecified: Secondary | ICD-10-CM

## 2014-11-28 MED ORDER — HYDROCODONE-ACETAMINOPHEN 5-325 MG PO TABS
1.0000 | ORAL_TABLET | Freq: Once | ORAL | Status: AC
Start: 2014-11-28 — End: 2014-11-28
  Administered 2014-11-28: 1 via ORAL
  Filled 2014-11-28: qty 1

## 2014-11-28 MED ORDER — HYDROCODONE-ACETAMINOPHEN 5-325 MG PO TABS: 1.0000 | ORAL_TABLET | Freq: Four times a day (QID) | ORAL | Status: DC | PRN

## 2014-11-28 NOTE — ED Notes (Signed)
Pt presents to department for evaluation of chronic back pain. Pt states increased pain today. 10/10 pain upon arrival. States physician called in pain medications this morning, but she hasn't been to pharmacy to pick them up. No signs of acute distress noted.

## 2014-11-28 NOTE — ED Provider Notes (Signed)
CSN: [id number]     Arrival date & time 11/28/14  1151 History  This chart was scribed for non-physician practitioner, Raymon Mutton, PA-C, working with Gilda Crease, MD, by Ronney Lion, ED Scribe. This patient was seen in room TR10C/TR10C and the patient's care was started at 2:03 PM.     Chief Complaint  Patient presents with  . Back Pain   The history is provided by the patient. No language interpreter was used.     HPI Comments: [name] is a 44 y.o. female with a history of DVT, measles, migraines, vaginal atrophy, dyspareunia, bipolar 1 disorder, and hypothyroidism who presents to the Emergency Department complaining of non-radiating dull, aching, lower back pain and "stretching" upper back pain that has been intermittent for 2 years ago but has recently become constant. Patient also reports occasional mild numbness in her bilateral lower extremities, but denies complete loss of sensation. She denies recent falls, injury, heavy lifting, or sudden twisting. Patient reports a history of a fracture in her back from falling and a stent with a balloon placed in her lower lumbar. She reports getting 2 separate cortisone shots from her PCP 3-4 months ago that completely alleviated her pain. Walking also alleviates her pain. She reports taking Advil, but she notes having stomach ulcers and tries to avoid taking it. Patient reports not taking Tramadol. She denies associated complete loss of sensation, tingling, bladder or bowel incontinence, nausea, vomiting, abdominal pain, chest pain, or SOB. Last menstrual-Patient's history of hysterectomy. Dr. Clance Boll at is her PCP  Dr. Audie Clear did her surgery.   Past Medical History  Diagnosis Date  . Deep venous thrombosis of leg 2012    completed year of coumadin   . Hx of measles   . Migraines   . Monilia infection 12/31/10  . Vaginal atrophy 03/04/11  . Dyspareunia 03/04/11  . Bipolar 1 disorder     Followed by Mental Health.  All  psychiatric medications prescribed by Mental Health.  Stable for many years.    . Hypothyroidism     Hypothyroidism since 8th grade.  Managed by Dr. Sharl Ma, Endocrinology.  On synthroid.  No prior thyroid surgery/ablation.    Past Surgical History  Procedure Laterality Date  . 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 Cesarean section  1998  . Tonsillectomy    . Wisdom tooth extraction    . Cholecystectomy    . Left elbow    . Abdominal hysterectomy  2000  . Appendectomy     Family History  Problem Relation Age of Onset  . Cancer Maternal Aunt    History  Substance Use Topics  . Smoking status: Current Every Day Smoker -- 0.40 packs/day    Types: Cigarettes    Last Attempt to Quit: 10/22/2012  . Smokeless tobacco: Never Used     Comment: 3 per day  . Alcohol Use: No   OB History    Gravida Para Term Preterm AB TAB SAB Ectopic Multiple Living   Review of Systems  Respiratory: Negative for shortness of breath.   Cardiovascular: Negative for chest pain.  Gastrointestinal: Negative for nausea, vomiting and abdominal pain.  Genitourinary: Negative for enuresis.  Musculoskeletal: Positive for back pain.  Neurological: Negative for numbness.      Allergies  Codeine; Meloxicam; Morphine and related; Naproxen; and Sulfonamide derivatives  Home Medications  Prior to Admission medications   Medication Sig Start Date End Date Taking? Authorizing Provider  acetaminophen (TYLENOL) 500 MG tablet Take 500 mg by mouth every 6 (six) hours as needed for mild pain or moderate pain.    Historical Provider, MD  ARIPiprazole (ABILIFY) 20 MG tablet Take 20 mg by mouth daily.    Historical Provider, MD  buPROPion (WELLBUTRIN XL) 150 MG 24 hr tablet Take 350 mg by mouth daily.     Historical Provider, MD  clonazePAM (KLONOPIN) 1 MG tablet Take 1 mg by mouth 2 (two) times daily.    Historical Provider, MD  cyclobenzaprine (FLEXERIL) 5 MG tablet Take 1 tablet (5 mg  total) by mouth 2 (two) times daily. 08/13/14 08/23/14  Dionne AnoJulia Mallory, MD  cyclobenzaprine (FLEXERIL) 5 MG tablet Take 1 tablet (5 mg total) by mouth every 8 (eight) hours as needed for muscle spasms. 10/22/14   Annett Gularacy N McLean, MD  gabapentin (NEURONTIN) 300 MG capsule Take 300 mg by mouth 3 (three) times daily. 07/22/14   Gara Kroneriana Truong, MD  HYDROcodone-acetaminophen (NORCO/VICODIN) 5-325 MG per tablet Take 1 tablet by mouth every 6 (six) hours as needed for moderate pain or severe pain. 11/28/14   Nitara Szczerba, PA-C  levothyroxine (SYNTHROID, LEVOTHROID) 100 MCG tablet Take 1 tablet (100 mcg total) by mouth daily before breakfast. 05/07/14   Gara Kroneriana Truong, MD  liothyronine (CYTOMEL) 5 MCG tablet Take 1 tablet (5 mcg total) by mouth daily. 04/09/14   Linward Headlandyan K Brown, MD  meclizine (ANTIVERT) 25 MG tablet Take 1 tablet (25 mg total) by mouth 2 (two) times daily as needed for dizziness. 09/09/14   Annett Gularacy N McLean, MD  ondansetron (ZOFRAN) 4 MG tablet Take 1 tablet (4 mg total) by mouth every 6 (six) hours. Patient not taking: Reported on 10/22/2014 07/20/14   Santiago GladHeather Laisure, PA-C  pantoprazole (PROTONIX) 40 MG tablet Take 1 tablet (40 mg total) by mouth daily. 04/10/14   Linward Headlandyan K Brown, MD  propranolol (INDERAL) 80 MG tablet Take 1 tablet (80 mg total) by mouth 3 (three) times daily. 10/22/14   Annett Gularacy N McLean, MD  protective barrier (RESTORE) CREA Apply 1 application topically daily as needed.     Historical Provider, MD  simvastatin (ZOCOR) 40 MG tablet Take 1 tablet (40 mg total) by mouth every morning. 01/01/14   Linward Headlandyan K Brown, MD  SUMAtriptan (IMITREX) 50 MG tablet Take 1 tablet (50 mg total) by mouth daily as needed for migraine or headache. May repeat in 2 hours if headache persists or recurs. 09/09/14   Annett Gularacy N McLean, MD  traMADol (ULTRAM) 50 MG tablet Take 1 tablet (50 mg total) by mouth every 6 (six) hours as needed (pain). 10/22/14   Annett Gularacy N McLean, MD  traZODone (DESYREL) 100 MG tablet Take 1 tablet (100  mg total) by mouth at bedtime. 03/11/14   Linward Headlandyan K Brown, MD   BP 112/75 mmHg  Pulse 74  Temp(Src) 98 F (36.7 C) (Oral)  Resp 18  SpO2 98% Physical Exam  Constitutional: She is oriented to person, place, and time. She appears well-developed and well-nourished. No distress.  HENT:  Head: Normocephalic and atraumatic.  Eyes: Conjunctivae and EOM are normal. Right eye exhibits no discharge. Left eye exhibits no discharge.  Neck: Normal range of motion. Neck supple.  Cardiovascular: Normal rate, regular rhythm and normal heart sounds.  Exam reveals no friction rub.   No murmur heard. Pulses:      Radial pulses are 2+ on the  right side, and 2+ on the left side.       Dorsalis pedis pulses are 2+ on the right side, and 2+ on the left side.  Pulmonary/Chest: Effort normal and breath sounds normal. No respiratory distress. She has no wheezes. She has no rales.  Musculoskeletal: Normal range of motion. She exhibits tenderness.       Lumbar back: She exhibits tenderness. She exhibits normal range of motion, no bony tenderness, no swelling, no edema and no deformity.       Back:  Negative swelling, erythema, inflammation, lesions, sores, deformities identified to the spine. Discomfort upon palpation to the mid lumbosacral and paravertebral regions bilaterally. Full ROM to upper and lower extremities without difficulty noted, negative ataxia noted.  Neurological: She is alert and oriented to person, place, and time. No cranial nerve deficit. She exhibits normal muscle tone. Coordination normal.  Cranial nerves III-XII grossly intact Strength 5+/5+ to upper and lower extremities bilaterally with resistance applied, equal distribution noted Equal grip strength bilaterally Sensation intact with differentiation to sharp and dull touch Negative saddle paresthesias bilaterally Gait proper, proper balance - negative sway, negative drift, negative step-offs  Skin: Skin is warm and dry. No rash noted. She  is not diaphoretic. No erythema.  Psychiatric: She has a normal mood and affect. Her behavior is normal. Thought content normal.  Nursing note and vitals reviewed.   ED Course  Procedures (including critical care time)  DIAGNOSTIC STUDIES: Oxygen Saturation is 98% on room air, normal by my interpretation.    COORDINATION OF CARE: 2:11 PM - Discussed treatment plan with pt at bedside which includes speaking with the attending physician, and pt agreed to plan.   Labs Review Labs Reviewed - No data to display  Imaging Review Dg Thoracic Spine W/swimmers  11/28/2014   CLINICAL DATA:  Chronic mid back pain without history of injury. Initial encounter.  EXAM: THORACIC SPINE - 2 VIEW + SWIMMERS  COMPARISON:  None.  FINDINGS: There is no evidence of thoracic spine fracture. Alignment is normal. Status post kyphoplasty of L1 vertebral body. Mild anterior osteophyte formation is noted in the lower thoracic spine.  IMPRESSION: Mild degenerative changes are noted in lower thoracic spine. No fracture or spondylolisthesis is noted.   Electronically Signed   By: Roque Lias M.D.   On: 11/28/2014 15:07   Dg Lumbar Spine Complete  11/28/2014   CLINICAL DATA:  44 year old female with mid back pain. No history of injury.  EXAM: LUMBAR SPINE - COMPLETE 4+ VIEW  COMPARISON:  CT of the abdomen and pelvis 07/20/2014.  FINDINGS: Five views of the lumbar spine again demonstrate an old compression fracture of L1 with approximately 30% loss of anterior vertebral body height and post vertebroplasty changes. No other acute displaced fracture or compression type fracture is noted. No defects of the pars interarticularis. Mild multilevel degenerative disc disease and multilevel facet arthropathy.  IMPRESSION: 1. No acute radiographic abnormality of the lumbar spine. 2. 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 3. Mild multilevel degenerative disc disease and lumbar spondylosis.    Electronically Signed   By: Trudie Reed M.D.   On: 11/28/2014 15:08     EKG Interpretation None       2:28 PM This provider discussed case in great detail with attending physician. As per attending recommended plain films of the thoracic and lumbar spine to be performed.  3:19 PM Patient requesting pain medications. Reported that she is not driving. Stated  that she has taken Vicodin in the past without difficulty or complications.  MDM   Final diagnoses:  Back pain  Muscle pain    Medications  HYDROcodone-acetaminophen (NORCO/VICODIN) 5-325 MG per tablet 1 tablet (1 tablet Oral Given 11/28/14 1525)     Filed Vitals:   11/28/14 1201  BP: 112/75  Pulse: 74  Temp: 98 F (36.7 C)  TempSrc: Oral  Resp: 18  SpO2: 98%   I personally performed the services described in this documentation, which was scribed in my presence. The recorded information has been reviewed and is accurate.  Patient presenting to emergency department with back pain that has gone progressively worse over the past couple of days without known injury or fall. Patient has been struggling with back pain for the past 2 years intermittently-reported that he ago she had kyphoplasty secondary to a fall resulting in a compression fracture in her lumbar spine. Patient currently being followed at [facility] orthopedics by Dr. Turner Daniels who gives patient cortisone injections-last injection was 3-4 months ago. Patient does have a neurosurgeon, Dr. Heide Guile.  Negative focal neurological deficits. Pulses palpable and strong. Full range of motion to upper and lower extremities bilaterally. Gait proper-negative step-offs or sway. Tenderness upon palpation to the mid lumbosacral spine and paravertebral regions bilaterally. Discussed case in great detail with attending physician, Dr. Blinda Leatherwood, as per physician recommended patient to get plain films of lumbar and thoracic spine and if negative patient can follow-up with her primary care  provider/orthopedic. Plain film of lumbar spine no acute radiographic abnormality of the lumbar spine noted. 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 Mild multilevel degenerative disc disease and lumbar spondylosis. Plain film of thoracic spine noted mild degenerative changes in the lower thoracic spine-no fracture or spinal listhesis is noted. Doubt cauda equina. Doubt epidural abscess. Negative findings of acute compression fractures noted on plain film. Discussed case and imaging in great detail with attending physician, Dr. Earna Coder - planned for patient to be discharged. Patient stable, afebrile. Patient not septic appearing. Discharged patient. Discharged patient with a small dose of pain medications - discussed course, precautions, disposal technique. Referred patient to PCP, orthopedics, and neurosurgery. Discussed with patient to rest and stay hydrated. Discussed with patient to closely monitor symptoms and if symptoms are to worsen or change to report back to the ED - strict return instructions given.  Patient agreed to plan of care, understood, all questions answered.   Raymon Mutton, PA-C 11/28/14 1552  Gilda Crease, MD 12/01/14 [phone]

## 2014-11-28 NOTE — Discharge Instructions (Signed)
Please call your doctor for a followup appointment within 24-48 hours. When you talk to your doctor please let them know that you were seen in the emergency department and have them acquire all of your records so that they can discuss the findings with you and formulate a treatment plan to fully care for your new and ongoing problems. Please follow-up with your primary care provider Please follow-up with your orthopedic and neurosurgeons Please take medications as prescribed - while on pain medications there is to be no drinking alcohol, driving, operating any heavy machinery. If extra please dispose in a proper manner. Please do not take any extra Tylenol with this medication for this can lead to Tylenol overdose and liver issues.  Please apply heat and massage Please avoid any physical or strenuous activity, please no heavy lifting Please continue to monitor symptoms closely and if symptoms are to worsen or change (fever greater than 101, chills, sweating, nausea, vomiting, chest pain, shortness of breathe, difficulty breathing, weakness, numbness, tingling, worsening or changes to pain pattern, fall, injury, loss of sensation, inability to control urine or bowel movements) please report back to the Emergency Department immediately.   Back Pain, Adult Low back pain is very common. About 1 in 5 people have back pain.The cause of low back pain is rarely dangerous. The pain often gets better over time.About half of people with a sudden onset of back pain feel better in just 2 weeks. About 8 in 10 people feel better by 6 weeks.  CAUSES Some common causes of back pain include:  Strain of the muscles or ligaments supporting the spine.  Wear and tear (degeneration) of the spinal discs.  Arthritis.  Direct injury to the back. DIAGNOSIS Most of the time, the direct cause of low back pain is not known.However, back pain can be treated effectively even when the exact cause of the pain is  unknown.Answering your caregiver's questions about your overall health and symptoms is one of the most accurate ways to make sure the cause of your pain is not dangerous. If your caregiver needs more information, he or she may order lab work or imaging tests (X-rays or MRIs).However, even if imaging tests show changes in your back, this usually does not require surgery. HOME CARE INSTRUCTIONS For many people, back pain returns.Since low back pain is rarely dangerous, it is often a condition that people can learn to [facility] their own.   Remain active. It is stressful on the back to sit or stand in one place. Do not sit, drive, or stand in one place for more than 30 minutes at a time. Take short walks on level surfaces as soon as pain allows.Try to increase the length of time you walk each day.  Do not stay in bed.Resting more than 1 or 2 days can delay your recovery.  Do not avoid exercise or work.Your body is made to move.It is not dangerous to be active, even though your back may hurt.Your back will likely heal faster if you return to being active before your pain is gone.  Pay attention to your body when you bend and lift. Many people have less discomfortwhen lifting if they bend their knees, keep the load close to their bodies,and avoid twisting. Often, the most comfortable positions are those that put less stress on your recovering back.  Find a comfortable position to sleep. Use a firm mattress and lie on your side with your knees slightly bent. If you lie on your back,  put a pillow under your knees.  Only take over-the-counter or prescription medicines as directed by your caregiver. Over-the-counter medicines to reduce pain and inflammation are often the most helpful.Your caregiver may prescribe muscle relaxant drugs.These medicines help dull your pain so you can more quickly return to your normal activities and healthy exercise.  Put ice on the injured area.  Put ice in a  plastic bag.  Place a towel between your skin and the bag.  Leave the ice on for 15-20 minutes, 03-04 times a day for the first 2 to 3 days. After that, ice and heat may be alternated to reduce pain and spasms.  Ask your caregiver about trying back exercises and gentle massage. This may be of some benefit.  Avoid feeling anxious or stressed.Stress increases muscle tension and can worsen back pain.It is important to recognize when you are anxious or stressed and learn ways to manage it.Exercise is a great option. SEEK MEDICAL CARE IF:  You have pain that is not relieved with rest or medicine.  You have pain that does not improve in 1 week.  You have new symptoms.  You are generally not feeling well. SEEK IMMEDIATE MEDICAL CARE IF:   You have pain that radiates from your back into your legs.  You develop new bowel or bladder control problems.  You have unusual weakness or numbness in your arms or legs.  You develop nausea or vomiting.  You develop abdominal pain.  You feel faint. Document Released: 10/17/2005 Document Revised: 04/17/2012 Document Reviewed: 02/18/2014 [facility] Patient Information 2015 [place]. This information is not intended to replace advice given to you by your health care provider. Make sure you discuss any questions you have with your health care provider.

## 2014-11-28 NOTE — ED Notes (Signed)
Pt ambulated in room, states it hurts in back while walking. Denies loss of bowel or bladder.

## 2014-11-30 ENCOUNTER — Telehealth: Payer: Self-pay | Admitting: Family Medicine

## 2014-11-30 DIAGNOSIS — G43909 Migraine, unspecified, not intractable, without status migrainosus: Secondary | ICD-10-CM

## 2014-11-30 MED ORDER — SUMATRIPTAN SUCCINATE 50 MG PO TABS: 50.0000 mg | ORAL_TABLET | Freq: Every day | ORAL | Status: DC | PRN

## 2014-11-30 NOTE — Telephone Encounter (Signed)
Family Medicine After hours phone call  Pt calls in reporting repeat migraine headache and out of imitrex. Says this feels like a normal migraine for her that has been unrelieved by tylenol, aleve (states she doesn't normally take this because of stomach ulcer). She reports good relief in the past with imitrex. States she has no weakness, no dizziness, no slurred speech, otherwise feels okay besides headache. Gave red flags for evaluation ASAP today, otherwise patient says she will follow up in clinic and call for appt tomorrow morning. Stated it was not clinic policy to do refills over the phone, but would send in one time refill for imitrex.   Noted to be Internal medicine clinic patient after rx and phone call concluded. Will forward note to PCP.   Tawni CarnesAndrew Detravion Tester, MD 11/30/2014, 10:55 AM PGY-2, [facility]

## 2014-12-03 ENCOUNTER — Encounter: Payer: Self-pay | Admitting: Internal Medicine

## 2014-12-03 ENCOUNTER — Ambulatory Visit: Payer: Self-pay | Admitting: Internal Medicine

## 2014-12-03 ENCOUNTER — Ambulatory Visit (INDEPENDENT_AMBULATORY_CARE_PROVIDER_SITE_OTHER): Payer: Medicaid Other | Admitting: Internal Medicine

## 2014-12-03 VITALS — BP 126/82 | HR 70 | Temp 98.1°F | Ht 65.0 in | Wt 235.5 lb

## 2014-12-03 DIAGNOSIS — M545 Low back pain: Secondary | ICD-10-CM

## 2014-12-03 DIAGNOSIS — G8929 Other chronic pain: Secondary | ICD-10-CM

## 2014-12-03 MED ORDER — CYCLOBENZAPRINE HCL 5 MG PO TABS: 5.0000 mg | ORAL_TABLET | Freq: Three times a day (TID) | ORAL | Status: DC | PRN

## 2014-12-03 MED ORDER — RIZATRIPTAN BENZOATE 5 MG PO TABS: 5.0000 mg | ORAL_TABLET | Freq: Once | ORAL | Status: DC | PRN

## 2014-12-03 MED ORDER — TRAMADOL HCL 50 MG PO TABS: 50.0000 mg | ORAL_TABLET | Freq: Four times a day (QID) | ORAL | Status: DC | PRN

## 2014-12-03 NOTE — Assessment & Plan Note (Signed)
Says been a chronic issue ever since her fall and the need for vertebral plasty. She was well controlled on tramadol and Flexeril but has been out of these medications. She has no red flag symptoms today to warrant further imaging. Most recent imaging on 11/28/14 that showed stable L1 compression fracture and stable vertebral plasty with no acute pathology. -Referral to orthopedics and neurosurgery for evaluation -Refill of one month tramadol and Flexeril -Patient was informed of all red flag symptoms that would require immediate evaluation by the ED and she verbalized understanding.

## 2014-12-03 NOTE — Patient Instructions (Signed)
General Instructions:   Please try to bring all your medicines next time. This will help us keep you safe from mistakes.   Progress Toward Treatment Goals:  Treatment Goal 10/22/2014  Stop smoking smoking the same amount  Prevent falls -    Self Care Goals & Plans:  Self Care Goal 12/03/2014  Manage my medications take my medicines as prescribed; bring my medications to every visit; refill my medications on time; follow the sick day instructions if I am sick  Monitor my health -  Eat healthy foods eat more vegetables; eat fruit for snacks and desserts; eat smaller portions  Be physically active find an activity I enjoy  Stop smoking -  Prevent falls -  Meeting treatment goals -    No flowsheet data found.   Care Management & Community Referrals:  Referral 10/22/2014  Referrals made for care management support none needed  Referrals made to community resources none

## 2014-12-03 NOTE — Progress Notes (Signed)
Subjective:   Patient ID: Wendy AlbaMelanie B Chase female   DOB: 1971/01/15 44 y.o.   MRN: [id number]  HPI: [name] is a 44 y.o. woman with a past medical history as listed below presents acutely for some ongoing back pain.  Patient states the back pain started ever since she had her surgery for an L1 compression fracture back in 12/14. She states it was secondary to a traumatic fall. She says that she is usually managed on tramadol and Flexeril but has been out of these medications for about 2 weeks. She presented to the ED on 11/28/14 for evaluation where images showed no acute pathology but stable compression fracture and vertebral plasty. She denies any numbness or tingling or shooting pains, no urinary or stool incontinence or saddle anesthesia along with no ambulation issues or leg weakness. She denies any new trauma to the area.  Her other concern today is that she has noticed some change in her reaction to sumatriptan. She feels that it gives her palpitations and a "foggy" feeling. She was very happy with the triptan and it worked very well for controlling her migraines but has stopped taking it given these new side effects. She has had no change in the quality of her headaches.   Past Medical History  Diagnosis Date  . Deep venous thrombosis of leg 2012    completed year of coumadin   . Hx of measles   . Migraines   . Monilia infection 12/31/10  . Vaginal atrophy 03/04/11  . Dyspareunia 03/04/11  . Bipolar 1 disorder     Followed by Mental Health.  All psychiatric medications prescribed by Mental Health.  Stable for many years.    . Hypothyroidism     Hypothyroidism since 8th grade.  Managed by Dr. Sharl MaKerr, Endocrinology.  On synthroid.  No prior thyroid surgery/ablation.    Current Outpatient Prescriptions  Medication Sig Dispense Refill  . acetaminophen (TYLENOL) 500 MG tablet Take 500 mg by mouth every 6 (six) hours as needed for mild pain or moderate pain.    . ARIPiprazole  (ABILIFY) 20 MG tablet Take 20 mg by mouth daily.    Marland Kitchen. buPROPion (WELLBUTRIN XL) 150 MG 24 hr tablet Take 350 mg by mouth daily.     . clonazePAM (KLONOPIN) 1 MG tablet Take 1 mg by mouth 2 (two) times daily.    . cyclobenzaprine (FLEXERIL) 5 MG tablet Take 1 tablet (5 mg total) by mouth every 8 (eight) hours as needed for muscle spasms. 90 tablet 0  . gabapentin (NEURONTIN) 300 MG capsule Take 300 mg by mouth 3 (three) times daily.    Marland Kitchen. HYDROcodone-acetaminophen (NORCO/VICODIN) 5-325 MG per tablet Take 1 tablet by mouth every 6 (six) hours as needed for moderate pain or severe pain. 5 tablet 0  . levothyroxine (SYNTHROID, LEVOTHROID) 100 MCG tablet Take 1 tablet (100 mcg total) by mouth daily before breakfast. 30 tablet 11  . liothyronine (CYTOMEL) 5 MCG tablet Take 1 tablet (5 mcg total) by mouth daily. 90 tablet 1  . meclizine (ANTIVERT) 25 MG tablet Take 1 tablet (25 mg total) by mouth 2 (two) times daily as needed for dizziness. 60 tablet 1  . ondansetron (ZOFRAN) 4 MG tablet Take 1 tablet (4 mg total) by mouth every 6 (six) hours. (Patient not taking: Reported on 10/22/2014) 12 tablet 0  . pantoprazole (PROTONIX) 40 MG tablet Take 1 tablet (40 mg total) by mouth daily. 30 tablet 11  . propranolol (INDERAL) 80  MG tablet Take 1 tablet (80 mg total) by mouth 3 (three) times daily. 90 tablet 1  . protective barrier (RESTORE) CREA Apply 1 application topically daily as needed.     . rizatriptan (MAXALT) 5 MG tablet Take 1 tablet (5 mg total) by mouth once as needed for migraine. May repeat in 2 hours if needed 30 tablet 2  . simvastatin (ZOCOR) 40 MG tablet Take 1 tablet (40 mg total) by mouth every morning. 30 tablet 11  . traMADol (ULTRAM) 50 MG tablet Take 1 tablet (50 mg total) by mouth every 6 (six) hours as needed (pain). 150 tablet 0  . traZODone (DESYREL) 100 MG tablet Take 1 tablet (100 mg total) by mouth at bedtime. 30 tablet 5   No current facility-administered medications for this  visit.   Family History  Problem Relation Age of Onset  . Cancer Maternal Aunt    History   Social History  . Marital Status: Legally Separated    Spouse Name: N/A    Number of Children: N/A  . Years of Education: N/A   Social History Main Topics  . Smoking status: Current Every Day Smoker -- 1.00 packs/day    Types: Cigarettes    Last Attempt to Quit: 10/22/2012  . Smokeless tobacco: Never Used     Comment: 3 per day  . Alcohol Use: No  . Drug Use: No  . Sexual Activity: Yes    Birth Control/ Protection: Surgical     Comment: hysterectomy   Other Topics Concern  . None   Social History Narrative   Review of Systems: Pertinent items are noted in HPI. Objective:  Physical Exam: Filed Vitals:   12/03/14 1330  BP: 126/82  Pulse: 70  Temp: 98.1 F (36.7 C)  TempSrc: Oral  Height:  (1.651 m)  Weight: 235 lb 8 oz (106.822 kg)  SpO2: 97%   General: sitting in chair, in some distress HEENT: PERRL, EOMI, no scleral icterus Cardiac: RRR, no rubs, murmurs or gallops Pulm: clear to auscultation bilaterally, moving normal volumes of air Abd: soft, nontender, nondistended, BS present Ext: warm and well perfused, no pedal edema MSK: some tenderness over L1-L2 region, normal ambulation and limited range of motion of back 2/2 pain, 5/5 LE strength bilaterally, 2+ DTRs patellar Neuro: alert and oriented X3, cranial nerves II-XII grossly intact  Assessment & Plan:  Please see problem oriented charting  Pt discussed with Dr. Heide Spark

## 2014-12-05 ENCOUNTER — Other Ambulatory Visit: Payer: Self-pay | Admitting: *Deleted

## 2014-12-05 DIAGNOSIS — E039 Hypothyroidism, unspecified: Secondary | ICD-10-CM

## 2014-12-05 NOTE — Telephone Encounter (Signed)
Pt called stating she has been out of this med for 1 month.  She can not see Dr Sharl MaKerr because she owes him $300.  She can not pay him.  He will not refill for her.

## 2014-12-08 NOTE — Progress Notes (Signed)
INTERNAL MEDICINE TEACHING ATTENDING ADDENDUM - Clarann Helvey, MD: I reviewed and discussed at the time of visit with the resident Dr. Sadek, the patient's medical history, physical examination, diagnosis and results of pertinent tests and treatment and I agree with the patient's care as documented.  

## 2014-12-09 MED ORDER — LIOTHYRONINE SODIUM 5 MCG PO TABS: 5.0000 ug | ORAL_TABLET | Freq: Every day | ORAL | Status: DC

## 2014-12-26 ENCOUNTER — Ambulatory Visit (HOSPITAL_COMMUNITY)
Admission: RE | Admit: 2014-12-26 | Discharge: 2014-12-26 | Disposition: A | Discharge status: Home / Self Care | Payer: Medicaid Other | Source: Home / Self Care | Attending: Psychiatry | Admitting: Psychiatry

## 2014-12-26 ENCOUNTER — Encounter (HOSPITAL_COMMUNITY): Payer: Self-pay

## 2014-12-26 ENCOUNTER — Emergency Department (HOSPITAL_COMMUNITY)
Admission: EM | Admit: 2014-12-26 | Discharge: 2014-12-26 | Disposition: A | Discharge status: Home / Self Care | Payer: Medicaid Other | Source: Home / Self Care | Attending: Emergency Medicine | Admitting: Emergency Medicine

## 2014-12-26 ENCOUNTER — Inpatient Hospital Stay (HOSPITAL_COMMUNITY)
Admission: AD | Admit: 2014-12-26 | Discharge: 2014-12-31 | DRG: 885 | Disposition: A | Discharge status: Home / Self Care | Payer: Medicaid Other | Source: Intra-hospital | Attending: Psychiatry | Admitting: Psychiatry

## 2014-12-26 DIAGNOSIS — I10 Essential (primary) hypertension: Secondary | ICD-10-CM | POA: Diagnosis present

## 2014-12-26 DIAGNOSIS — E780 Pure hypercholesterolemia: Secondary | ICD-10-CM | POA: Diagnosis present

## 2014-12-26 DIAGNOSIS — Z8619 Personal history of other infectious and parasitic diseases: Secondary | ICD-10-CM

## 2014-12-26 DIAGNOSIS — Z720 Tobacco use: Secondary | ICD-10-CM

## 2014-12-26 DIAGNOSIS — Z90710 Acquired absence of both cervix and uterus: Secondary | ICD-10-CM

## 2014-12-26 DIAGNOSIS — Z96651 Presence of right artificial knee joint: Secondary | ICD-10-CM | POA: Diagnosis present

## 2014-12-26 DIAGNOSIS — G8929 Other chronic pain: Secondary | ICD-10-CM

## 2014-12-26 DIAGNOSIS — Z79899 Other long term (current) drug therapy: Secondary | ICD-10-CM

## 2014-12-26 DIAGNOSIS — F3130 Bipolar disorder, current episode depressed, mild or moderate severity, unspecified: Secondary | ICD-10-CM | POA: Diagnosis present

## 2014-12-26 DIAGNOSIS — F419 Anxiety disorder, unspecified: Secondary | ICD-10-CM | POA: Diagnosis present

## 2014-12-26 DIAGNOSIS — F17210 Nicotine dependence, cigarettes, uncomplicated: Secondary | ICD-10-CM | POA: Diagnosis present

## 2014-12-26 DIAGNOSIS — K219 Gastro-esophageal reflux disease without esophagitis: Secondary | ICD-10-CM | POA: Diagnosis present

## 2014-12-26 DIAGNOSIS — E785 Hyperlipidemia, unspecified: Secondary | ICD-10-CM | POA: Diagnosis present

## 2014-12-26 DIAGNOSIS — M545 Low back pain: Secondary | ICD-10-CM

## 2014-12-26 DIAGNOSIS — E039 Hypothyroidism, unspecified: Secondary | ICD-10-CM | POA: Diagnosis present

## 2014-12-26 DIAGNOSIS — G43909 Migraine, unspecified, not intractable, without status migrainosus: Secondary | ICD-10-CM

## 2014-12-26 DIAGNOSIS — F319 Bipolar disorder, unspecified: Secondary | ICD-10-CM | POA: Diagnosis not present

## 2014-12-26 DIAGNOSIS — Z809 Family history of malignant neoplasm, unspecified: Secondary | ICD-10-CM

## 2014-12-26 DIAGNOSIS — Z86718 Personal history of other venous thrombosis and embolism: Secondary | ICD-10-CM | POA: Insufficient documentation

## 2014-12-26 DIAGNOSIS — F322 Major depressive disorder, single episode, severe without psychotic features: Secondary | ICD-10-CM

## 2014-12-26 DIAGNOSIS — F329 Major depressive disorder, single episode, unspecified: Secondary | ICD-10-CM

## 2014-12-26 DIAGNOSIS — R42 Dizziness and giddiness: Secondary | ICD-10-CM

## 2014-12-26 LAB — COMPREHENSIVE METABOLIC PANEL
ALK PHOS: 93 U/L (ref 39–117)
ALT: 22 U/L (ref 0–35)
ANION GAP: 7 (ref 5–15)
AST: 23 U/L (ref 0–37)
Albumin: 3.7 g/dL (ref 3.5–5.2)
BUN: 12 mg/dL (ref 6–23)
CO2: 26 mmol/L (ref 19–32)
Calcium: 9.1 mg/dL (ref 8.4–10.5)
Chloride: 110 mmol/L (ref 96–112)
Creatinine, Ser: 1.46 mg/dL — ABNORMAL HIGH (ref 0.50–1.10)
GFR calc non Af Amer: 42 mL/min — ABNORMAL LOW (ref >90)
GFR, EST AFRICAN AMERICAN: 49 mL/min — AB (ref >90)
Glucose, Bld: 95 mg/dL (ref 70–99)
Potassium: 3.9 mmol/L (ref 3.5–5.1)
SODIUM: 143 mmol/L (ref 135–145)
TOTAL PROTEIN: 6.8 g/dL (ref 6.0–8.3)
Total Bilirubin: 0.3 mg/dL (ref 0.3–1.2)

## 2014-12-26 LAB — CBC WITH DIFFERENTIAL/PLATELET
BASOS PCT: 0 % (ref 0–1)
Basophils Absolute: 0 10*3/uL (ref 0.0–0.1)
EOS ABS: 0.1 10*3/uL (ref 0.0–0.7)
EOS PCT: 1 % (ref 0–5)
HCT: 40 % (ref 36.0–46.0)
Hemoglobin: 13.5 g/dL (ref 12.0–15.0)
Lymphocytes Relative: 31 % (ref 12–46)
Lymphs Abs: 3.5 10*3/uL (ref 0.7–4.0)
MCH: 30.5 pg (ref 26.0–34.0)
MCHC: 33.8 g/dL (ref 30.0–36.0)
MCV: 90.5 fL (ref 78.0–100.0)
MONOS PCT: 5 % (ref 3–12)
Monocytes Absolute: 0.6 10*3/uL (ref 0.1–1.0)
Neutro Abs: 7.3 10*3/uL (ref 1.7–7.7)
Neutrophils Relative %: 63 % (ref 43–77)
PLATELETS: 224 10*3/uL (ref 150–400)
RBC: 4.42 MIL/uL (ref 3.87–5.11)
RDW: 13.7 % (ref 11.5–15.5)
WBC: 11.5 10*3/uL — ABNORMAL HIGH (ref 4.0–10.5)

## 2014-12-26 LAB — ETHANOL: Alcohol, Ethyl (B): <5 mg/dL (ref 0–9)

## 2014-12-26 LAB — RAPID URINE DRUG SCREEN, HOSP PERFORMED
Amphetamines: NOT DETECTED
BARBITURATES: NOT DETECTED
Benzodiazepines: POSITIVE — AB
COCAINE: NOT DETECTED
OPIATES: NOT DETECTED
Tetrahydrocannabinol: NOT DETECTED

## 2014-12-26 MED ORDER — LIOTHYRONINE SODIUM 5 MCG PO TABS
5.0000 ug | ORAL_TABLET | Freq: Every day | ORAL | Status: DC
  Administered 2014-12-27 – 2014-12-31 (×5): 5 ug via ORAL
  Filled 2014-12-26 (×7): qty 1

## 2014-12-26 MED ORDER — ALUM & MAG HYDROXIDE-SIMETH 200-200-20 MG/5ML PO SUSP: 30.0000 mL | ORAL | Status: DC | PRN

## 2014-12-26 MED ORDER — SUMATRIPTAN SUCCINATE 50 MG PO TABS
50.0000 mg | ORAL_TABLET | Freq: Once | ORAL | Status: AC
  Administered 2014-12-26: 50 mg via ORAL
  Filled 2014-12-26: qty 1

## 2014-12-26 MED ORDER — SUMATRIPTAN SUCCINATE 25 MG PO TABS
25.0000 mg | ORAL_TABLET | ORAL | Status: DC | PRN
  Administered 2014-12-27 – 2014-12-28 (×6): 25 mg via ORAL
  Filled 2014-12-26 (×6): qty 1

## 2014-12-26 MED ORDER — SUMATRIPTAN SUCCINATE 50 MG PO TABS
50.0000 mg | ORAL_TABLET | Freq: Every day | ORAL | Status: DC
  Filled 2014-12-26: qty 1

## 2014-12-26 MED ORDER — PANTOPRAZOLE SODIUM 40 MG PO TBEC
40.0000 mg | DELAYED_RELEASE_TABLET | Freq: Every day | ORAL | Status: DC
  Administered 2014-12-27 – 2014-12-31 (×5): 40 mg via ORAL
  Filled 2014-12-26 (×7): qty 1

## 2014-12-26 MED ORDER — PROPRANOLOL HCL 40 MG PO TABS
80.0000 mg | ORAL_TABLET | Freq: Three times a day (TID) | ORAL | Status: DC
  Administered 2014-12-27 – 2014-12-31 (×11): 80 mg via ORAL
  Filled 2014-12-26: qty 2
  Filled 2014-12-26 (×2): qty 1
  Filled 2014-12-26 (×3): qty 2
  Filled 2014-12-26: qty 1
  Filled 2014-12-26: qty 2
  Filled 2014-12-26: qty 1
  Filled 2014-12-26: qty 2
  Filled 2014-12-26: qty 1
  Filled 2014-12-26: qty 2
  Filled 2014-12-26 (×2): qty 1
  Filled 2014-12-26 (×2): qty 2
  Filled 2014-12-26: qty 1
  Filled 2014-12-26: qty 2
  Filled 2014-12-26 (×2): qty 1
  Filled 2014-12-26 (×2): qty 2

## 2014-12-26 MED ORDER — LEVOTHYROXINE SODIUM 100 MCG PO TABS
100.0000 ug | ORAL_TABLET | Freq: Every day | ORAL | Status: DC
  Administered 2014-12-27 – 2014-12-31 (×5): 100 ug via ORAL
  Filled 2014-12-26 (×8): qty 1

## 2014-12-26 MED ORDER — MECLIZINE HCL 25 MG PO TABS
25.0000 mg | ORAL_TABLET | Freq: Two times a day (BID) | ORAL | Status: DC | PRN
  Administered 2014-12-28: 25 mg via ORAL
  Filled 2014-12-26 (×3): qty 1

## 2014-12-26 MED ORDER — ARIPIPRAZOLE 10 MG PO TABS
20.0000 mg | ORAL_TABLET | Freq: Every day | ORAL | Status: DC
  Administered 2014-12-27 – 2014-12-31 (×5): 20 mg via ORAL
  Filled 2014-12-26 (×8): qty 2

## 2014-12-26 MED ORDER — TRAZODONE HCL 100 MG PO TABS
100.0000 mg | ORAL_TABLET | Freq: Every day | ORAL | Status: DC
  Administered 2014-12-26 – 2014-12-30 (×5): 100 mg via ORAL
  Filled 2014-12-26 (×9): qty 1

## 2014-12-26 MED ORDER — BUPROPION HCL ER (XL) 150 MG PO TB24
150.0000 mg | ORAL_TABLET | Freq: Every day | ORAL | Status: DC
  Administered 2014-12-27 – 2014-12-31 (×5): 150 mg via ORAL
  Filled 2014-12-26 (×8): qty 1

## 2014-12-26 MED ORDER — ACETAMINOPHEN 325 MG PO TABS: 650.0000 mg | ORAL_TABLET | Freq: Four times a day (QID) | ORAL | Status: DC | PRN

## 2014-12-26 MED ORDER — TRAMADOL HCL 50 MG PO TABS
50.0000 mg | ORAL_TABLET | Freq: Four times a day (QID) | ORAL | Status: DC | PRN
  Administered 2014-12-27 – 2014-12-31 (×7): 50 mg via ORAL
  Filled 2014-12-26 (×7): qty 1

## 2014-12-26 MED ORDER — BUPROPION HCL ER (XL) 300 MG PO TB24
300.0000 mg | ORAL_TABLET | Freq: Every day | ORAL | Status: DC
  Administered 2014-12-27 – 2014-12-31 (×5): 300 mg via ORAL
  Filled 2014-12-26 (×9): qty 1

## 2014-12-26 MED ORDER — MAGNESIUM HYDROXIDE 400 MG/5ML PO SUSP: 30.0000 mL | Freq: Every day | ORAL | Status: DC | PRN

## 2014-12-26 MED ORDER — SIMVASTATIN 40 MG PO TABS
40.0000 mg | ORAL_TABLET | Freq: Every day | ORAL | Status: DC
  Administered 2014-12-27 – 2014-12-30 (×4): 40 mg via ORAL
  Filled 2014-12-26: qty 2
  Filled 2014-12-26 (×6): qty 1
  Filled 2014-12-26: qty 2

## 2014-12-26 MED ORDER — CLONAZEPAM 1 MG PO TABS
1.0000 mg | ORAL_TABLET | Freq: Two times a day (BID) | ORAL | Status: DC
  Administered 2014-12-26 – 2014-12-31 (×10): 1 mg via ORAL
  Filled 2014-12-26 (×10): qty 1

## 2014-12-26 MED ORDER — GABAPENTIN 300 MG PO CAPS
300.0000 mg | ORAL_CAPSULE | Freq: Three times a day (TID) | ORAL | Status: DC
  Administered 2014-12-27 – 2014-12-31 (×14): 300 mg via ORAL
  Filled 2014-12-26 (×19): qty 1

## 2014-12-26 NOTE — ED Provider Notes (Signed)
CSN: [id number]     Arrival date & time 12/26/14  1629 History  This chart was scribed for non-physician practitioner, Marlon Pel, PA-C working with Merrie Roof, *, by Jarvis Morgan, ED Scribe. This patient was seen in room WTR4/WLPT4 and the patient's care was started at 4:48 PM.     Chief Complaint  Patient presents with  . Depression    The history is provided by the patient. No language interpreter was used.    HPI Comments: [name] is a 44 y.o. female with a h/o hypothyroidism with PMH of DVT, measles, migraines, vaginal atrophy, dyspareunia, bipolar 1 disorder, and hypothyroidism presents to the Emergency Department for a medical clearance for her depression. She states she was sent here by [facility] and has had a full assessment done there. She states she has been having gradually worsening migraines. Pt takes Maxalt 5 mg  for mgmt of her migraines. She has 2-4 migraines a month and this headache is not different then her usual but she was not aware she wouldn't be going back home and did not bring her medications. Pt also suffers from chronic back pain. She reports the triggers for her depression have been coming from her relationship with her mother and her boyfriend. She states she needs to get out of the relationship with her boyfriend but she cannot bring herself to do it. She denies any HI, SI, hallucinations, or substance abuse uses.  Past Medical History  Diagnosis Date  . Deep venous thrombosis of leg 2012    completed year of coumadin   . Hx of measles   . Migraines   . Monilia infection 12/31/10  . Vaginal atrophy 03/04/11  . Dyspareunia 03/04/11  . Bipolar 1 disorder     Followed by Mental Health.  All psychiatric medications prescribed by Mental Health.  Stable for many years.    . Hypothyroidism     Hypothyroidism since 8th grade.  Managed by Dr. Sharl Ma, Endocrinology.  On synthroid.  No prior thyroid surgery/ablation.    Past Surgical History  Procedure  Laterality Date  . 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 Cesarean section  1998  . Tonsillectomy    . Wisdom tooth extraction    . Cholecystectomy    . Left elbow    . Abdominal hysterectomy  2000  . Appendectomy     Family History  Problem Relation Age of Onset  . Cancer Maternal Aunt    History  Substance Use Topics  . Smoking status: Current Every Day Smoker -- 1.00 packs/day    Types: Cigarettes    Last Attempt to Quit: 10/22/2012  . Smokeless tobacco: Never Used     Comment: 3 per day  . Alcohol Use: No   OB History    Gravida Para Term Preterm AB TAB SAB Ectopic Multiple Living   Review of Systems  Musculoskeletal: Positive for back pain (chronic).  Neurological: Positive for headaches (h/o migraines).  Psychiatric/Behavioral: Positive for dysphoric mood. Negative for suicidal ideas, hallucinations and self-injury.  All other systems reviewed and are negative.     Allergies  Codeine; Meloxicam; Morphine and related; Naproxen; and Sulfonamide derivatives  Home Medications   Prior to Admission medications   Medication Sig Start Date End Date Taking? Authorizing Provider  ARIPiprazole (ABILIFY) 20 MG tablet Take 20 mg by mouth at bedtime.  Yes Historical Provider, MD  buPROPion (WELLBUTRIN XL) 150 MG 24 hr tablet Take 150 mg by mouth daily. Take along with 300 mg tablet by mouth daily.   Yes Historical Provider, MD  buPROPion (WELLBUTRIN XL) 300 MG 24 hr tablet Take 300 mg by mouth daily. Take along with 150 mg tablet by mouth daily.   Yes Historical Provider, MD  clonazePAM (KLONOPIN) 1 MG tablet Take 1 mg by mouth 2 (two) times daily.   Yes Historical Provider, MD  cyclobenzaprine (FLEXERIL) 5 MG tablet Take 1 tablet (5 mg total) by mouth every 8 (eight) hours as needed for muscle spasms. 12/03/14  Yes Christen BameNora Sadek, MD  gabapentin (NEURONTIN) 300 MG capsule Take 300 mg by mouth 3 (three) times daily. 07/22/14  Yes Gara Kroneriana Truong, MD   levothyroxine (SYNTHROID, LEVOTHROID) 100 MCG tablet Take 1 tablet (100 mcg total) by mouth daily before breakfast. 05/07/14  Yes Gara Kroneriana Truong, MD  liothyronine (CYTOMEL) 5 MCG tablet Take 1 tablet (5 mcg total) by mouth daily. 12/09/14  Yes Nischal Narendra, MD  ondansetron (ZOFRAN) 4 MG tablet Take 1 tablet (4 mg total) by mouth every 6 (six) hours. 07/20/14  Yes Heather Laisure, PA-C  pantoprazole (PROTONIX) 40 MG tablet Take 1 tablet (40 mg total) by mouth daily. 04/10/14  Yes Linward Headlandyan K Brown, MD  propranolol (INDERAL) 80 MG tablet Take 1 tablet (80 mg total) by mouth 3 (three) times daily. 10/22/14  Yes Annett Gularacy N McLean, MD  rizatriptan (MAXALT) 5 MG tablet Take 1 tablet (5 mg total) by mouth once as needed for migraine. May repeat in 2 hours if needed 12/03/14 12/03/15 Yes Christen BameNora Sadek, MD  simvastatin (ZOCOR) 40 MG tablet Take 1 tablet (40 mg total) by mouth every morning. 01/01/14  Yes Linward Headlandyan K Brown, MD  traMADol (ULTRAM) 50 MG tablet Take 1 tablet (50 mg total) by mouth every 6 (six) hours as needed (pain). 12/03/14  Yes Christen BameNora Sadek, MD  traZODone (DESYREL) 100 MG tablet Take 1 tablet (100 mg total) by mouth at bedtime. 03/11/14  Yes Linward Headlandyan K Brown, MD  HYDROcodone-acetaminophen (NORCO/VICODIN) 5-325 MG per tablet Take 1 tablet by mouth every 6 (six) hours as needed for moderate pain or severe pain. Patient not taking: Reported on 12/26/2014 11/28/14   Marissa Sciacca, PA-C  meclizine (ANTIVERT) 25 MG tablet Take 1 tablet (25 mg total) by mouth 2 (two) times daily as needed for dizziness. Patient not taking: Reported on 12/26/2014 09/09/14   Annett Gularacy N McLean, MD   Triage Vitals: BP 131/89 mmHg  Pulse 76  Temp(Src) 97.4 F (36.3 C) (Oral)  Resp 14  SpO2 98%  Physical Exam  Constitutional: She is oriented to person, place, and time. She appears well-developed and well-nourished. No distress.  HENT:  Head: Normocephalic and atraumatic.  Eyes: Conjunctivae and EOM are normal.  Neck: Neck supple. No tracheal  deviation present.  Cardiovascular: Normal rate.   Pulmonary/Chest: Effort normal. No respiratory distress.  Musculoskeletal: Normal range of motion.  Neurological: She is alert and oriented to person, place, and time.  Skin: Skin is warm and dry.  Psychiatric: Her behavior is normal. Her mood appears not anxious. Her speech is not rapid and/or pressured. She is not actively hallucinating. She exhibits a depressed mood. She expresses no homicidal and no suicidal ideation. She expresses no suicidal plans and no homicidal plans. She is attentive.  Nursing note and vitals reviewed.   ED Course  Procedures (including critical care time)  DIAGNOSTIC STUDIES: Oxygen Saturation  is 98% on RA, normal by my interpretation.    COORDINATION OF CARE: 4:53 PM- Will order CBC w/ diff, CMP, urine drug screen and ethanol screen. Pt advised of plan for treatment and pt agrees.     Labs Review Labs Reviewed  CBC WITH DIFFERENTIAL/PLATELET - Abnormal; Notable for the following:    WBC 11.5 (*)    All other components within normal limits  COMPREHENSIVE METABOLIC PANEL  ETHANOL  URINE RAPID DRUG SCREEN (HOSP PERFORMED)    Imaging Review No results found.   EKG Interpretation None      MDM   Final diagnoses:  Severe depression    We called over to [facility] and they have a bed assigned for her as soon as she is medically cleared. Will order her home medications for her migraine which she says always help.  Filed Vitals:   12/26/14 1634  BP: 131/89  Pulse: 76  Temp: 97.4 F (36.3 C)  Resp: 14    Patients labs are unremarkable. She is medically cleared. She was given her home dose for Migraine and reports significant relief.  Presentation is non concerning for [facility], ICH, Meningitis, or temporal arteritis. Pt is afebrile with no focal neuro deficits, nuchal rigidity, or change in vision.  Pt discharged to behavioral health.   The patient denies any symptoms of neurological impairment  or TIA's; no amaurosis, diplopia, dysphasia, or unilateral disturbance of motor or sensory function. No loss of balance or vertigo.   I personally performed the services described in this documentation, which was scribed in my presence. The recorded information has been reviewed and is accurate.    Dorthula Matas, PA-C 12/26/14 1952  Candyce Churn III, MD 12/26/14 2029

## 2014-12-26 NOTE — Discharge Instructions (Signed)

## 2014-12-26 NOTE — Tx Team (Signed)
Initial Interdisciplinary Treatment Plan   PATIENT STRESSORS: Financial difficulties Marital or family conflict   PATIENT STRENGTHS: Ability for insight Average or above average intelligence Capable of independent living Supportive family/friends   PROBLEM LIST: Problem List/Patient Goals Date to be addressed Date deferred Reason deferred Estimated date of resolution  Coping skills for increased depression and anxiety      Coping skills for anger                                                 DISCHARGE CRITERIA:  Improved stabilization in mood, thinking, and/or behavior Motivation to continue treatment in a less acute level of care Verbal commitment to aftercare and medication compliance  PRELIMINARY DISCHARGE PLAN: Outpatient therapy Participate in family therapy Return to previous living arrangement  PATIENT/FAMIILY INVOLVEMENT: This treatment plan has been presented to and reviewed with the patient, [name], and/or family member, .  The patient and family have been given the opportunity to ask questions and make suggestions.  [name], [name] Jean Horne 12/26/2014, 9:39 PM

## 2014-12-26 NOTE — ED Notes (Signed)
[facility] called for transport to [facility].  

## 2014-12-26 NOTE — ED Notes (Signed)
Pt presents with c/o depression. [facility] and was sent over here for medical clearance. Pt reports she has had depression all of her life but it has gotten worse over the last few weeks. Pt reports she needs to leave her boyfriend and has been having arguments with her mother. Pt denies SI/HI.
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
## 2014-12-26 NOTE — Progress Notes (Signed)
Pt. Is a 44 year old female with history of bipolar 1 D/O. She  [facility] reporting increased depression over the last two months, due to arguments with her Mother and Boyfriend.  Pt. Reports they are both verbally abusive although she reports her Mother is supportive.  Pt. Reports she was diagnosed with bipolar disorder at a very young age.  Pt. Has history of chronic back pain, and right knee pain.  Pt. Is on disability.  Pt. Has history of DVT in 2012, migraines, Monilia Infection, vaginal atrophy, dyspareunia, and hypothyroidism (pt. Is obese). Surgical history includes total R knee replacement in 2001, C section 1998, tonsillectomy, total teeth extraction(false teeth), cholecystectomy, abdominal hysterectomy, and appendectomy. Pt. Denies ETOH or drug use.  Pt is allergic to codeine, melaxicam,  Morphine, naproxen, and sulfonamide derivatives.  Pt has a 46 year old son who she is estranged from.  Writer questioned patient about bipolar symptoms and her response was she gets very depressed, and is only manic when she gets her check "I go shopping and spend it".  Pt. Was calm and cooperative during the admission process.  [facility].  Pt. Takes appr. 12 home meds. Pt. Was oriented to the adult unit and escorted to the 300 hall where she was offered food and fluids.

## 2014-12-26 NOTE — ED Notes (Signed)
[facility], pt will be going to 302-1

## 2014-12-26 NOTE — Addendum Note (Signed)
Addended by: Neomia DearPOWERS, Babe Clenney E on: 12/26/2014 07:34 PM   Modules accepted: Orders

## 2014-12-27 LAB — TSH: TSH: 13.094 u[IU]/mL — ABNORMAL HIGH (ref 0.350–4.500)

## 2014-12-27 MED ORDER — LIP MEDEX EX OINT
TOPICAL_OINTMENT | CUTANEOUS | Status: DC | PRN
  Administered 2014-12-27: 15:00:00 via TOPICAL
  Filled 2014-12-27: qty 7
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
## 2014-12-27 NOTE — [facility] Group Notes (Signed)
[facility]:Coping skills  Date:  12/27/2014  Time:  2:55 PM  Type of Therapy:  Nurse Education  Participation Level:  Active  Participation Quality:  Appropriate  Affect:  Appropriate  Cognitive:  Appropriate  Insight:  Appropriate  Engagement in Group:  Engaged  Modes of Intervention:  Discussion  Summary of Progress/Problems:  Nicole CellaWebb, Bettymae Yott Guyes 12/27/2014, 2:55 PM

## 2014-12-27 NOTE — Progress Notes (Addendum)
Adult Psychoeducational Group Note  Date:  12/27/2014 Time:  4:39 PM  Group Topic/Focus:  Psychoeducational  Participation Level:  Active  Participation Quality:  Appropriate and Attentive  Affect:  Appropriate  Cognitive:  Alert and Appropriate  Insight: Good  Engagement in Group:  Engaged  Modes of Intervention:  Activity and Discussion  Additional Comments:  Pt. Reported recently being upset with her mother, resulting in her becoming upset and crying.  Pt. Confirmed that she wanted to feel better.  Talbert NanSLOAN, Bogdan Vivona N 12/27/2014, 4:39 PM

## 2014-12-27 NOTE — Progress Notes (Addendum)
Pt is pleasant this am . She stated that she has back pain and was given 50mg  of tramadol for low back pain a 7/10. Pt rates her depression a 7/10 and her anxiety a 7/10. Pt wants to make sure she attends all groups this am. She does contract for safety and denies SI and HI. 1:10p-Pt c/o migraine and given immietrix.
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
## 2014-12-27 NOTE — [facility] Group Notes (Signed)
[facility] Group Notes:  (Clinical Social Work)  12/27/2014     10-11AM  Summary of Progress/Problems:   The main focus of today's process group was to learn how to use a decisional balance exercise to move forward in the Stages of Change, which were described and discussed.  Motivational Interviewing and a worksheet were utilized to help patients explore in depth the perceived benefits and costs of a self-sabotaging behavior, as well as the  benefits and costs of replacing that with a healthy coping mechanism.   The patient expressed herself throughout group, was very engaged.  She stated one of her main unhealthy coping mechanisms is blaming others for her problems, particularly her mother, and blowing up.  Type of Therapy:  Group Therapy - Process   Participation Level:  Active  Participation Quality:  Attentive, Sharing and Supportive  Affect:  Appropriate and Blunted  Cognitive:  Alert and Appropriate  Insight:  Engaged  Engagement in Therapy:  Engaged  Modes of Intervention:  Education, Motivational Interviewing  Ambrose MantleMareida Grossman-Orr, LCSW 12/27/2014, 12:27 PM

## 2014-12-27 NOTE — H&P (Signed)
Psychiatric Admission Assessment Adult  Patient Identification: [name]  MRN:  [id number]  Date of Evaluation:  12/27/2014  Chief Complaint:  [name]  Principal Diagnosis: Bipolar I disorder, most recent episode depressed  Diagnosis:   Patient Active Problem List   Diagnosis Date Noted  . Right shoulder pain [M25.511] 09/09/2014  . New onset of headaches [R51] 08/12/2014  . Migraine headache [G43.909] 08/12/2014  . Abdominal pain [R10.9] 08/12/2014  . AKI (acute kidney injury) [N17.9] 08/12/2014  . Vertigo [R42] 05/21/2014  . Screen for STD (sexually transmitted disease) [Z11.3] 03/11/2014  . Insomnia [G47.00] 03/11/2014  . Orthostatic hypotension [I95.1] 01/29/2014  . Chronic low back pain [M54.5, G89.29] 09/11/2013  . Frequent falls [R29.6] 09/02/2013  . Preventative health care [Z00.00] 08/16/2013  . Right knee pain [M25.561] 08/14/2012  . Tobacco abuse [Z72.0] 08/14/2012  . Menopausal symptoms [N95.1] 03/16/2012  . History of DVT of lower extremity [Z86.718] 03/16/2012  . HYPERCHOLESTEROLEMIA, PURE [E78.0] 04/10/2007  . RHINITIS, ALLERGIC NOS [J30.9] 04/10/2007  . GERD [K21.9] 04/10/2007  . Hypothyroidism [E03.9] 01/17/2007  . Bipolar I disorder, most recent episode depressed [F31.30] 01/17/2007  . OSTEOARTHRITIS [M19.90] 01/17/2007   History of Present Illness: [name] is a 44 year old Caucasian female admitted to [facility] as a walk-in & medically cleared at the [facility]. She reports, "I have Bipolar disorder with depression. I take medicine for it. The medicines are working, however, things has been building & piling up that I can no longer handle them. I have been lashing out at & bickering with my mother. My boyfriend & I are having problems. He has nothing nice to say about anything. He abuses alcohol & weed, and I don't do any of those things. He has been getting on my nerves. I have been feeling this way x 2 months, my depression has been  worsening in the last 4 months. My symptoms even worsened more in the last 4 days. I have been on Abilify 15 mg, Wellbutrin 450 mg & Neurontin 300 mg tid. I would want to remain on these medicines because they do work for me. I came here to learn some coping skills to help me deal with my problems".   Elements:  Location:  Bipolar affective disorder. Quality:  Mood swings, high anxiety levels. Severity:  Severe. Timing:  Depression symptoms has been worsening x 4 mos. Duration:  Chronix. Context:  "Things has been piling up on me, I felt I can't handle anymre, I decided to come to the hospital".  Associated Signs/Symptoms:  Depression Symptoms:  depressed mood, anxiety, loss of energy/fatigue,  Hypo) Manic Symptoms:  Irritable Mood, Labiality of Mood, (mood swings)  Anxiety Symptoms:  Excessive Worry,  Psychotic Symptoms:  Denies  PTSD Symptoms: NA  Total Time spent with patient: 1 hour  Past Medical History:  Past Medical History  Diagnosis Date  . Deep venous thrombosis of leg 2012    completed year of coumadin   . Hx of measles   . Migraines   . Monilia infection 12/31/10  . Vaginal atrophy 03/04/11  . Dyspareunia 03/04/11  . Hypothyroidism     Hypothyroidism since 8th grade.  Managed by Dr. Sharl Ma, Endocrinology.  On synthroid.  No prior thyroid surgery/ablation.   . Bipolar 1 disorder     Followed by Mental Health.  All psychiatric medications prescribed by Mental Health.  Stable for many years.      Past Surgical History  Procedure Laterality Date  . Replacement total  knee  2001    right knee  . Cesarean section  1998  . Tonsillectomy    . Wisdom tooth extraction    . Cholecystectomy    . Left elbow    . Abdominal hysterectomy  2000  . Appendectomy     Family History:  Family History  Problem Relation Age of Onset  . Cancer Maternal Aunt   . Other Father    Social History:  History  Alcohol Use No    Comment: Pt. admits to 1 glass only on special  occasions like BDay etc.     History  Drug Use No    History   Social History  . Marital Status: Legally Separated    Spouse Name: N/A  . Number of Children: N/A  . Years of Education: N/A   Social History Main Topics  . Smoking status: Current Every Day Smoker -- 1.00 packs/day    Types: Cigarettes    Last Attempt to Quit: 10/22/2012  . Smokeless tobacco: Never Used     Comment: Pt. is not ready to quit/ no interest in cessation materials/ pt. knows danger of smoking  . Alcohol Use: No     Comment: Pt. admits to 1 glass only on special occasions like BDay etc.  . Drug Use: No  . Sexual Activity: Yes    Birth Control/ Protection: Surgical, Condom     Comment: hysterectomy   Other Topics Concern  . None   Social History Narrative   Additional Social History:    Pain Medications: see PTA Prescriptions: see PTA Over the Counter: NA History of alcohol / drug use?: No history of alcohol / drug abuse  Musculoskeletal: Strength & Muscle Tone: within normal limits Gait & Station: normal Patient leans: N/A  Psychiatric Specialty Exam: Physical Exam  Constitutional: She is oriented to person, place, and time. She appears well-developed.  HENT:  Head: Normocephalic.  Eyes: Pupils are equal, round, and reactive to light.  Neck: Normal range of motion.  Genitourinary:  Denies any issues in this area  Musculoskeletal: Normal range of motion.  Neurological: She is alert and oriented to person, place, and time.  Skin: Skin is warm and dry.    Review of Systems  Constitutional: Negative.   Eyes: Negative.   Respiratory: Negative.   Cardiovascular: Negative.   Gastrointestinal: Positive for heartburn.       Hx stomach ulcers  Genitourinary: Negative.   Musculoskeletal: Negative.   Neurological: Negative.   Psychiatric/Behavioral: Positive for depression. Negative for suicidal ideas, hallucinations, memory loss and substance abuse. The patient is nervous/anxious. The  patient does not have insomnia.     Blood pressure 130/70, pulse 70, temperature 97.5 F (36.4 C), temperature source Oral, resp. rate 16, height  (1.626 m), weight 108.41 kg (239 lb).Body mass index is 41 kg/(m^2).  General Appearance: Casual  Eye Contact::  Good  Speech:  Clear and Coherent  Volume:  Normal  Mood:  Anxious and Depressed  Affect:  Flat  Thought Process:  Coherent  Orientation:  Full (Time, Place, and Person)  Thought Content:  Rumination  Suicidal Thoughts:  No  Homicidal Thoughts:  No  Memory:  Immediate;   Good Recent;   Good Remote;   Good  Judgement:  Fair  Insight:  Present  Psychomotor Activity:  Normal  Concentration:  Good  Recall:  Good  Fund of Knowledge:Fair  Language: Good  Akathisia:  No  Handed:  Right  AIMS (if indicated):  Assets:  Desire for Improvement  ADL's:  Intact  Cognition: WNL  Sleep:  Number of Hours: 6.25   Risk to Self: Is patient at risk for suicide?: No What has been your use of drugs/alcohol within the last 12 months?: Alcohol on New Year's and birthdays  Risk to Others: Yes  Prior Inpatient Therapy: Yes  Prior Outpatient Therapy: Yes  Alcohol Screening: 1. How often do you have a drink containing alcohol?: Monthly or less (just on special occasions 1 drink) 2. How many drinks containing alcohol do you have on a typical day when you are drinking?: 1 or 2 3. How often do you have six or more drinks on one occasion?: Never Preliminary Score: 0 9. Have you or someone else been injured as a result of your drinking?: No 10. Has a relative or friend or a doctor or another health worker been concerned about your drinking or suggested you cut down?: No Alcohol Use Disorder Identification Test Final Score (AUDIT): 1  Allergies:   Allergies  Allergen Reactions  . Codeine Nausea And Vomiting  . Meloxicam Nausea Only  . Morphine And Related Other (See Comments)    Makes me loopy and hallucinates  . Naproxen Nausea  Only and Rash  . Sulfonamide Derivatives Nausea Only and Rash   Lab Results:  Results for orders placed or performed during the hospital encounter of 12/26/14 (from the past 48 hour(s))  TSH     Status: Abnormal   Collection Time: 12/27/14  6:30 AM  Result Value Ref Range   TSH 13.094 (H) 0.350 - 4.500 uIU/mL    Comment: Performed at [facility]   Current Medications: Current Facility-Administered Medications  Medication Dose Route Frequency Provider Last Rate Last Dose  . acetaminophen (TYLENOL) tablet 650 mg  650 mg Oral Q6H PRN Dayna Barker, NP      . alum & mag hydroxide-simeth (MAALOX/MYLANTA) 200-200-20 MG/5ML suspension 30 mL  30 mL Oral Q4H PRN Dayna Barker, NP      . ARIPiprazole (ABILIFY) tablet 20 mg  20 mg Oral Daily Dayna Barker, NP      . buPROPion (WELLBUTRIN XL) 24 hr tablet 150 mg  150 mg Oral Daily Dayna Barker, NP   150 mg at 12/27/14 0817  . buPROPion (WELLBUTRIN XL) 24 hr tablet 300 mg  300 mg Oral Daily Dayna Barker, NP   300 mg at 12/27/14 0817  . clonazePAM (KLONOPIN) tablet 1 mg  1 mg Oral BID Dayna Barker, NP   1 mg at 12/27/14 0818  . gabapentin (NEURONTIN) capsule 300 mg  300 mg Oral TID Dayna Barker, NP   300 mg at 12/27/14 1145  . levothyroxine (SYNTHROID, LEVOTHROID) tablet 100 mcg  100 mcg Oral QAC breakfast Dayna Barker, NP   100 mcg at 12/27/14 [phone]  . liothyronine (CYTOMEL) tablet 5 mcg  5 mcg Oral Daily Dayna Barker, NP   5 mcg at 12/27/14 [phone]  . lip balm (CARMEX) ointment   Topical PRN Canary Brim, NP      . magnesium hydroxide (MILK OF MAGNESIA) suspension 30 mL  30 mL Oral Daily PRN Dayna Barker, NP      . meclizine (ANTIVERT) tablet 25 mg  25 mg Oral BID PRN Dayna Barker, NP      . pantoprazole (PROTONIX) EC tablet 40 mg  40 mg Oral Daily Dayna Barker, NP   40 mg at 12/27/14 0819  .  propranolol (INDERAL) tablet 80 mg  80 mg Oral TID  Dayna Barker, NP   80 mg at 12/27/14 1145  . simvastatin (ZOCOR) tablet 40 mg  40 mg Oral q1800 Dayna Barker, NP      . SUMAtriptan (IMITREX) tablet 25 mg  25 mg Oral Q2H PRN Dayna Barker, NP   25 mg at 12/27/14 1313  . traMADol (ULTRAM) tablet 50 mg  50 mg Oral Q6H PRN Dayna Barker, NP   50 mg at 12/27/14 1610  . traZODone (DESYREL) tablet 100 mg  100 mg Oral QHS Dayna Barker, NP   100 mg at 12/26/14 2301   PTA Medications: Prescriptions prior to admission  Medication Sig Dispense Refill Last Dose  . ARIPiprazole (ABILIFY) 20 MG tablet Take 20 mg by mouth at bedtime.    12/25/2014 at Unknown time  . buPROPion (WELLBUTRIN XL) 150 MG 24 hr tablet Take 150 mg by mouth daily. Take along with 300 mg tablet by mouth daily.   12/26/2014 at Unknown time  . buPROPion (WELLBUTRIN XL) 300 MG 24 hr tablet Take 300 mg by mouth daily. Take along with 150 mg tablet by mouth daily.   12/26/2014 at Unknown time  . clonazePAM (KLONOPIN) 1 MG tablet Take 1 mg by mouth 2 (two) times daily.   12/26/2014 at Unknown time  . cyclobenzaprine (FLEXERIL) 5 MG tablet Take 1 tablet (5 mg total) by mouth every 8 (eight) hours as needed for muscle spasms. 90 tablet 0 Past Month at Unknown time  . gabapentin (NEURONTIN) 300 MG capsule Take 300 mg by mouth 3 (three) times daily.   12/26/2014 at Unknown time  . levothyroxine (SYNTHROID, LEVOTHROID) 100 MCG tablet Take 1 tablet (100 mcg total) by mouth daily before breakfast. 30 tablet 11 12/26/2014 at Unknown time  . liothyronine (CYTOMEL) 5 MCG tablet Take 1 tablet (5 mcg total) by mouth daily. 90 tablet 1 12/26/2014 at Unknown time  . ondansetron (ZOFRAN) 4 MG tablet Take 1 tablet (4 mg total) by mouth every 6 (six) hours. 12 tablet 0 12/26/2014 at Unknown time  . pantoprazole (PROTONIX) 40 MG tablet Take 1 tablet (40 mg total) by mouth daily. 30 tablet 11 12/26/2014 at Unknown time  . propranolol (INDERAL) 80 MG tablet Take 1 tablet (80 mg  total) by mouth 3 (three) times daily. 90 tablet 1 12/26/2014 at 1200  . rizatriptan (MAXALT) 5 MG tablet Take 1 tablet (5 mg total) by mouth once as needed for migraine. May repeat in 2 hours if needed 30 tablet 2 Past Month at Unknown time  . simvastatin (ZOCOR) 40 MG tablet Take 1 tablet (40 mg total) by mouth every morning. 30 tablet 11 12/26/2014 at Unknown time  . traMADol (ULTRAM) 50 MG tablet Take 1 tablet (50 mg total) by mouth every 6 (six) hours as needed (pain). 150 tablet 0 Past Month at Unknown time  . traZODone (DESYREL) 100 MG tablet Take 1 tablet (100 mg total) by mouth at bedtime. 30 tablet 5 12/25/2014 at Unknown time  . HYDROcodone-acetaminophen (NORCO/VICODIN) 5-325 MG per tablet Take 1 tablet by mouth every 6 (six) hours as needed for moderate pain or severe pain. (Patient not taking: Reported on 12/26/2014) 5 tablet 0 Completed Course at Unknown time  . meclizine (ANTIVERT) 25 MG tablet Take 1 tablet (25 mg total) by mouth 2 (two) times daily as needed for dizziness. (Patient not taking: Reported on 12/26/2014) 60 tablet 1 Completed Course at Unknown time  Previous Psychotropic Medications: Yes   Substance Abuse History in the last 12 months:  No.  Consequences of Substance Abuse: Medical Consequences:  Liver damage, Possible death by overdose Legal Consequences:  Arrests, jail time, Loss of driving privilege. Family Consequences:  Family discord, divorce and or separation.  Results for orders placed or performed during the hospital encounter of 12/26/14 (from the past 72 hour(s))  TSH     Status: Abnormal   Collection Time: 12/27/14  6:30 AM  Result Value Ref Range   TSH 13.094 (H) 0.350 - 4.500 uIU/mL    Comment: [facility]    Observation Level/Precautions:  15 minute checks  Laboratory:  Per ED  Psychotherapy:  Group sessions  [facility]  [facility]  Consultations: As needed  Discharge Concerns: Mood stability   Estimated LOS:  3-5 days  Other:     Psychological Evaluations: Yes   Treatment Plan Summary: Daily contact with patient to assess and evaluate symptoms and progress in treatment and Medication management:  1. Admit for crisis management and stabilization, estimated length of stay 3-5 days.  2. Medication management to reduce current symptoms to base line and improve the patient's overall level of functioning: Per patient's requests, resume Abilify 15 mg for mood control, Wellbutrin XL 150 & 300 mg for depression, clonazepam 1 mg for anxiety, gabapentin 300 mg tid for agitation & Trazodone 100 mg for insomnia. 3. Treat health problems as indicated:, Resume Synthroid 100 mcg for hypothyroidism, Protonix EC 40 mg for GERD, Cytomel 5 mg for ulcers, Zocor 40 mg for high cholesterol, Imitrex 25 mg for migraine HA, Tramadol 50 mg for pain, Meclizine 25 mg for dizziness, propranolol 80 mg for anxiety/HTN.   4. Develop treatment plan to decrease risk of relapse upon discharge and the need for readmission.  5. Psycho-social education regarding relapse prevention and self care.  6. Health care follow up as needed for medical problems.  7. Review, reconcile, and reinstate any pertinent home medications for other health issues where appropriate. 8. Call for consults with hospitalist for any additional specialty patient care services as needed.  Medical Decision Making:  New problem, with additional work up planned, Review of Psycho-Social Stressors (1), Review or order clinical lab tests (1), Review and summation of old records (2), Review of Medication Regimen & Side Effects (2) and Review of New Medication or Change in Dosage (2)  I certify that inpatient services furnished can reasonably be expected to improve the patient's condition.   Sanjuana Kavawoko, Agnes I, PMHNP-BC 2/27/20163:33 PM  I have personally seen the patient and agreed with the findings and involved in the treatment plan. Kathryne SharperSyed Madyx Delfin, MD

## 2014-12-28 DIAGNOSIS — F3130 Bipolar disorder, current episode depressed, mild or moderate severity, unspecified: Secondary | ICD-10-CM

## 2014-12-28 MED ORDER — LISINOPRIL 10 MG PO TABS
10.0000 mg | ORAL_TABLET | Freq: Every day | ORAL | Status: DC
  Administered 2014-12-28 – 2014-12-31 (×4): 10 mg via ORAL
  Filled 2014-12-28 (×2): qty 1
  Filled 2014-12-28: qty 2
  Filled 2014-12-28 (×4): qty 1

## 2014-12-28 NOTE — Progress Notes (Signed)
Adult Psychoeducational Group Note  Date:  12/28/2014 Time:  2015  Group Topic/Focus:  AA Meeting  Participation Level:  Active   Additional Comments:  Pt attended group.   Cheryl Chay, [facility] 12/28/2014, 10:31 PM

## 2014-12-28 NOTE — [facility] Group Notes (Signed)
[facility] Group Notes:  (Nursing/MHT/Case Management/Adjunct)  Date:  12/28/2014  Time:  3:48 PM  Type of Therapy:  Psychoeducational Skills  Participation Level:  Did Not Attend  Participation Quality:  Did Not Attend  Affect:  Did Not Attend  Cognitive:  Did Not Attend  Insight:  None  Engagement in Group:  Did Not Attend  Modes of Intervention:  Did Not Attend  Summary of Progress/Problems: Pt did attend patient self inventory group.   [name] 12/28/2014, 3:48 PM 

## 2014-12-28 NOTE — Progress Notes (Signed)
D: Pt presents with flat affect and depressed mood. Pt has minimal interaction on the unit. Pt forwarded little information during shift assessment. Pt c/o migraine at 0820. Pt requested Imitrex for pain. Pt received Imitrex at 0403. Pt made aware of prn ordered and offered Tramadol for pain. Tramadol given at 0820 for migraine. Pt visible on the unit and is compliant with taking meds and attending groups. Pt denies suicidal thoughts. Pt rates depression 7/10 d/t her home situations. No adverse reactions to meds verbalized by pt.  A: Medications administered as ordered per MD. Verbal support given. Pt encouraged to attend groups. Pt encouraged to report worsening symptoms.  15 minute checks performed for safety.  R: Pt receptive to treatment.

## 2014-12-28 NOTE — Progress Notes (Signed)
[facility] MD Progress Note  12/28/2014 2:47 PM Liston AlbaMelanie B Griess  MRN:  [id number]  Subjective: [name] says' "I feel a little better today. I went to group & got the support I needed. I'm hoping that what I learned so far in group sessions will go home with me into the real world".  Principal Problem: Bipolar I disorder, most recent episode depressed Diagnosis:   Patient Active Problem List   Diagnosis Date Noted  . Right shoulder pain [M25.511] 09/09/2014  . New onset of headaches [R51] 08/12/2014  . Migraine headache [G43.909] 08/12/2014  . Abdominal pain [R10.9] 08/12/2014  . AKI (acute kidney injury) [N17.9] 08/12/2014  . Vertigo [R42] 05/21/2014  . Screen for STD (sexually transmitted disease) [Z11.3] 03/11/2014  . Insomnia [G47.00] 03/11/2014  . Orthostatic hypotension [I95.1] 01/29/2014  . Chronic low back pain [M54.5, G89.29] 09/11/2013  . Frequent falls [R29.6] 09/02/2013  . Preventative health care [Z00.00] 08/16/2013  . Right knee pain [M25.561] 08/14/2012  . Tobacco abuse [Z72.0] 08/14/2012  . Menopausal symptoms [N95.1] 03/16/2012  . History of DVT of lower extremity [Z86.718] 03/16/2012  . HYPERCHOLESTEROLEMIA, PURE [E78.0] 04/10/2007  . RHINITIS, ALLERGIC NOS [J30.9] 04/10/2007  . GERD [K21.9] 04/10/2007  . Hypothyroidism [E03.9] 01/17/2007  . Bipolar I disorder, most recent episode depressed [F31.30] 01/17/2007  . OSTEOARTHRITIS [M19.90] 01/17/2007   Total Time spent with patient: 35 minutes   Past Medical History:  Past Medical History  Diagnosis Date  . Deep venous thrombosis of leg 2012    completed year of coumadin   . Hx of measles   . Migraines   . Monilia infection 12/31/10  . Vaginal atrophy 03/04/11  . Dyspareunia 03/04/11  . Hypothyroidism     Hypothyroidism since 8th grade.  Managed by Dr. Sharl MaKerr, Endocrinology.  On synthroid.  No prior thyroid surgery/ablation.   . Bipolar 1 disorder     Followed by Mental Health.  All psychiatric medications prescribed  by Mental Health.  Stable for many years.      Past Surgical History  Procedure Laterality Date  . 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 Cesarean section  1998  . Tonsillectomy    . Wisdom tooth extraction    . Cholecystectomy    . Left elbow    . Abdominal hysterectomy  2000  . Appendectomy     Family History:  Family History  Problem Relation Age of Onset  . Cancer Maternal Aunt   . Other Father    Social History:  History  Alcohol Use No    Comment: Pt. admits to 1 glass only on special occasions like BDay etc.     History  Drug Use No    History   Social History  . Marital Status: Legally Separated    Spouse Name: N/A  . Number of Children: N/A  . Years of Education: N/A   Social History Main Topics  . Smoking status: Current Every Day Smoker -- 1.00 packs/day    Types: Cigarettes    Last Attempt to Quit: 10/22/2012  . Smokeless tobacco: Never Used     Comment: Pt. is not ready to quit/ no interest in cessation materials/ pt. knows danger of smoking  . Alcohol Use: No     Comment: Pt. admits to 1 glass only on special occasions like BDay etc.  . Drug Use: No  . Sexual Activity: Yes    Birth Control/ Protection: Surgical, Condom     Comment: hysterectomy  Other Topics Concern  . None   Social History Narrative   Additional History:    Sleep: Fair  Appetite:  Good  Assessment: Patient is seen, chart reviewed. Adreonna is visible on the unit. She is active in the group milieu. She is tolerating her medication regimen without any adverse effects. She currently denies any new problems.  Musculoskeletal: Strength & Muscle Tone: within normal limits Gait & Station: normal Patient leans: N/A  Psychiatric Specialty Exam: Physical Exam  Review of Systems  Constitutional: Negative.   HENT: Negative.   Eyes: Negative.   Respiratory: Negative.   Cardiovascular: Negative.   Gastrointestinal: Negative.   Genitourinary: Negative.    Musculoskeletal: Negative.   Skin: Negative.   Neurological: Negative.   Endo/Heme/Allergies: Negative.   Psychiatric/Behavioral: Positive for depression. Negative for suicidal ideas, hallucinations, memory loss and substance abuse. The patient is not nervous/anxious and does not have insomnia.     Blood pressure 117/78, pulse 62, temperature 98.4 F (36.9 C), temperature source Oral, resp. rate 20, height  (1.626 m), weight 108.41 kg (239 lb).Body mass index is 41 kg/(m^2).  General Appearance: Casual  Eye Contact::  Fair  Speech:  Clear and Coherent  Volume:  Normal  Mood:  "Improving"  Affect:  Flat  Thought Process:  Coherent and Logical  Orientation:  Full (Time, Place, and Person)  Thought Content:  Rumination  Suicidal Thoughts:  No  Homicidal Thoughts:  No  Memory:  Immediate;   Good Recent;   Good Remote;   Good  Judgement:  Fair  Insight:  Fair  Psychomotor Activity:  Decreased  Concentration:  Fair  Recall:  Good  Fund of Knowledge:Fair  Language: Good  Akathisia:  No  Handed:  Right  AIMS (if indicated):     Assets:  Desire for Improvement  ADL's:  Intact  Cognition: WNL  Sleep:  Number of Hours: 5.5   Current Medications: Current Facility-Administered Medications  Medication Dose Route Frequency Provider Last Rate Last Dose  . acetaminophen (TYLENOL) tablet 650 mg  650 mg Oral Q6H PRN Dayna Barker, NP      . alum & mag hydroxide-simeth (MAALOX/MYLANTA) 200-200-20 MG/5ML suspension 30 mL  30 mL Oral Q4H PRN Dayna Barker, NP      . ARIPiprazole (ABILIFY) tablet 20 mg  20 mg Oral Daily Dayna Barker, NP   20 mg at 12/28/14 0820  . buPROPion (WELLBUTRIN XL) 24 hr tablet 150 mg  150 mg Oral Daily Dayna Barker, NP   150 mg at 12/28/14 4098  . buPROPion (WELLBUTRIN XL) 24 hr tablet 300 mg  300 mg Oral Daily Dayna Barker, NP   300 mg at 12/28/14 1191  . clonazePAM (KLONOPIN) tablet 1 mg  1 mg Oral BID Dayna Barker, NP   1 mg at 12/28/14 0820  . gabapentin (NEURONTIN) capsule 300 mg  300 mg Oral TID Dayna Barker, NP   300 mg at 12/28/14 1209  . levothyroxine (SYNTHROID, LEVOTHROID) tablet 100 mcg  100 mcg Oral QAC breakfast Dayna Barker, NP   100 mcg at 12/28/14 4782  . liothyronine (CYTOMEL) tablet 5 mcg  5 mcg Oral Daily Dayna Barker, NP   5 mcg at 12/28/14 0820  . lip balm (CARMEX) ointment   Topical PRN Canary Brim, NP      . lisinopril (PRINIVIL,ZESTRIL) tablet 10 mg  10 mg Oral Daily Sanjuana Kava, NP      .  magnesium hydroxide (MILK OF MAGNESIA) suspension 30 mL  30 mL Oral Daily PRN Dayna Barker, NP      . meclizine (ANTIVERT) tablet 25 mg  25 mg Oral BID PRN Dayna Barker, NP      . pantoprazole (PROTONIX) EC tablet 40 mg  40 mg Oral Daily Dayna Barker, NP   40 mg at 12/28/14 1610  . propranolol (INDERAL) tablet 80 mg  80 mg Oral TID Dayna Barker, NP   80 mg at 12/28/14 1209  . simvastatin (ZOCOR) tablet 40 mg  40 mg Oral q1800 Dayna Barker, NP   40 mg at 12/27/14 1812  . SUMAtriptan (IMITREX) tablet 25 mg  25 mg Oral Q2H PRN Dayna Barker, NP   25 mg at 12/28/14 0403  . traMADol (ULTRAM) tablet 50 mg  50 mg Oral Q6H PRN Dayna Barker, NP   50 mg at 12/28/14 0820  . traZODone (DESYREL) tablet 100 mg  100 mg Oral QHS Dayna Barker, NP   100 mg at 12/27/14 2106   Lab Results:  Results for orders placed or performed during the hospital encounter of 12/26/14 (from the past 48 hour(s))  TSH     Status: Abnormal   Collection Time: 12/27/14  6:30 AM  Result Value Ref Range   TSH 13.094 (H) 0.350 - 4.500 uIU/mL    Comment: Performed at [facility]    Physical Findings: AIMS: Facial and Oral Movements Muscles of Facial Expression: None, normal Lips and Perioral Area: None, normal Jaw: None, normal Tongue: None, normal,Extremity Movements Upper (arms, wrists, hands, fingers): None,  normal Lower (legs, knees, ankles, toes): None, normal, Trunk Movements Neck, shoulders, hips: None, normal, Overall Severity Patient's awareness of abnormal movements (rate only patient's report): No Awareness, Dental Status Current problems with teeth and/or dentures?: Yes (upper and lower) Does patient usually wear dentures?: Yes  CIWA:    COWS:     Treatment Plan Summary: Daily contact with patient to assess and evaluate symptoms and progress in treatment and Medication management: Continue crisis management and mood stabilization Continue to encourage to participate in group and individual sessions Continue medication management/ and review as needed Continue Abilify 20 mg QHS for mood control, Trazodone 100 mg po qhs for mood control, Propranolol 80 mg for anxiety/HTN, Klonopin 1 mg for anxiety, Wellbutrin XL 450 mg for depression, neurontin 300 mg for agitation Initiate Lisinopril 10 mg for elevated blood pressure Address health issues as needed.   Medical Decision Making:  Established Problem, Stable/Improving (1), Review of Psycho-Social Stressors (1), Review of Last Therapy Session (1), Review of Medication Regimen & Side Effects (2) and Review of New Medication or Change in Dosage (2)  Kentrel Clevenger I, PMHNP-BC, FNP-BC 12/28/2014, 2:47 PM

## 2014-12-28 NOTE — [facility] Group Notes (Signed)
[facility] Group Notes:  (Nursing/MHT/Case Management/Adjunct)  Date:  12/28/2014  Time:  3:52 PM  Type of Therapy:  Psychoeducational Skills  Participation Level:  Did Not Attend  Participation Quality:  Did Not Attend  Affect:  Did Not Attend  Cognitive:  Did Not Attend  Insight:  None  Engagement in Group:  Did Not Attend  Modes of Intervention:  Did Not Attend  Summary of Progress/Problems: Pt did attend healthy support systems group.    Jacquelyne BalintForrest, Evarose Altland Shanta 12/28/2014, 3:52 PM

## 2014-12-28 NOTE — Progress Notes (Signed)
[facility] Group Notes:  (Nursing/MHT/Case Management/Adjunct)  Date:  12/28/2014  Time:  12:10 AM  Type of Therapy:  Psychoeducational Skills  Participation Level:  Active  Participation Quality:  Appropriate  Affect:  Appropriate  Cognitive:  Appropriate  Insight:  Improving  Engagement in Group:  Improving  Modes of Intervention:  Education  Summary of Progress/Problems: The patient offered very little to share in group last evening beyond the fact that she had a good day. Her support system (theme of the day) is going to be her mother and neighbors.   [name] S 12/28/2014, 12:10 AM
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
## 2014-12-28 NOTE — Progress Notes (Signed)
D: Pt's mood is depressed. Pt minimally spoke with this staff member. Seems withdrawn at times.  A: Support given. Verbalization encouraged. Pt encouraged to come to staff with any concerns. Medications given as prescribed.  R: Pt is receptive. No complaints of pain or discomfort at this time. Q15 min safety checks maintained. Will continue to monitor pt.

## 2014-12-29 NOTE — [facility] Group Notes (Signed)
[facility] LCSW Group Therapy  12/29/2014 12:50 PM  Type of Therapy:  Group Therapy  Participation Level:  Active  Participation Quality:  Attentive  Affect:  Appropriate  Cognitive:  Alert and Oriented  Insight:  Engaged  Engagement in Therapy:  Engaged  Modes of Intervention:  Confrontation, Discussion, Education, Exploration, Problem-solving, Rapport Building, Socialization and Support  Summary of Progress/Problems: Today's Topic: Overcoming Obstacles. Pt identified obstacles faced currently and processed barriers involved in overcoming these obstacles. Pt identified steps necessary for overcoming these obstacles and explored motivation (internal and external) for facing these difficulties head on. Pt further identified one area of concern in their lives and chose a skill of focus pulled from their "toolbox." [name] was attentive and engaged during today's processing group. She shared that that her biggest obstacle involves "grieving over the loss of my 2 year relationship with my ex." [name] shows progress in the group setting and improving insight AEB her ability to identify healthy coping skills for managing this grief and overcoming her identified obstacle "I'm going to try journaling again. Writing down my feelings helps me get them out without being destructive about it." [name] shared that she has many family and friend supports to help her through this difficult time and "I plan to reach out to them more."    Smart, [name] LCSWA 12/29/2014, 12:50 PM
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
## 2014-12-29 NOTE — [facility] Group Notes (Signed)
Adult Psychoeducational Group Note  Date:  12/29/2014 Time:  11:46 PM  Group Topic/Focus:  Wrap-Up Group:   The focus of this group is to help patients review their daily goal of treatment and discuss progress on daily workbooks.  Participation Level:  Active  Participation Quality:  Appropriate  Affect:  Appropriate  Cognitive:  Appropriate  Insight: Appropriate  Engagement in Group:  Engaged  Modes of Intervention:  Discussion  Additional Comments:  [name] said her day was good.  She went to groups and got good feedback.  She said she can finally "let him go".  Her goal was to talk and participate in groups and she is discharging Wednesday.  Caroll RancherLindsay, [place] Perra A 12/29/2014, 11:46 PM

## 2014-12-29 NOTE — [facility] Group Notes (Signed)
[facility]   12/29/2014 10:18 AM  Participation Quality:  Appropriate   Mood/Affect:  Depressed  Depression Rating:  7  Anxiety Rating:  7  Thoughts of Suicide:  No Will you contract for safety?   NA  Current AVH:  No  Plan for Discharge/Comments:  Pt reports that she lives alone and plans to return home at d/c. She is hoping to d/c by Wed. [facility] and has appt Tues that needs to be rescheduled. CSW notified Vikki PortsValerie who is in process of getting appt rescheduled. Pt reports that 'meds are helping.'   Transportation Means: mother  Supports: mother/family supports   Counselling psychologistmart, OncologistHeather LCSWA

## 2014-12-29 NOTE — Progress Notes (Signed)
[facility] MD Progress Note  12/29/2014 7:19 PM [name]  MRN:  [id number] Subjective:  [name] continues to have a hard time accepting the loss of the relationship with her BF. States that it feels like a loss. After going to the grief group states she has realized she is grieving the loss of the relationship. She has been more irritable easier to go off on people. Does not feel it would be a medication issue but mostly needing help with coping with all that is going on.  Principal Problem: Bipolar I disorder, most recent episode depressed Diagnosis:   Patient Active Problem List   Diagnosis Date Noted  . Right shoulder pain [M25.511] 09/09/2014  . New onset of headaches [R51] 08/12/2014  . Migraine headache [G43.909] 08/12/2014  . Abdominal pain [R10.9] 08/12/2014  . AKI (acute kidney injury) [N17.9] 08/12/2014  . Vertigo [R42] 05/21/2014  . Screen for STD (sexually transmitted disease) [Z11.3] 03/11/2014  . Insomnia [G47.00] 03/11/2014  . Orthostatic hypotension [I95.1] 01/29/2014  . Chronic low back pain [M54.5, G89.29] 09/11/2013  . Frequent falls [R29.6] 09/02/2013  . Preventative health care [Z00.00] 08/16/2013  . Right knee pain [M25.561] 08/14/2012  . Tobacco abuse [Z72.0] 08/14/2012  . Menopausal symptoms [N95.1] 03/16/2012  . History of DVT of lower extremity [Z86.718] 03/16/2012  . HYPERCHOLESTEROLEMIA, PURE [E78.0] 04/10/2007  . RHINITIS, ALLERGIC NOS [J30.9] 04/10/2007  . GERD [K21.9] 04/10/2007  . Hypothyroidism [E03.9] 01/17/2007  . Bipolar I disorder, most recent episode depressed [F31.30] 01/17/2007  . OSTEOARTHRITIS [M19.90] 01/17/2007   Total Time spent with patient: 30 minutes   Past Medical History:  Past Medical History  Diagnosis Date  . Deep venous thrombosis of leg 2012    completed year of coumadin   . Hx of measles   . Migraines   . Monilia infection 12/31/10  . Vaginal atrophy 03/04/11  . Dyspareunia 03/04/11  . Hypothyroidism     Hypothyroidism  since 8th grade.  Managed by Dr. Sharl Ma, Endocrinology.  On synthroid.  No prior thyroid surgery/ablation.   . Bipolar 1 disorder     Followed by Mental Health.  All psychiatric medications prescribed by Mental Health.  Stable for many years.      Past Surgical History  Procedure Laterality Date  . 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 Cesarean section  1998  . Tonsillectomy    . Wisdom tooth extraction    . Cholecystectomy    . Left elbow    . Abdominal hysterectomy  2000  . Appendectomy     Family History:  Family History  Problem Relation Age of Onset  . Cancer Maternal Aunt   . Other Father    Social History:  History  Alcohol Use No    Comment: Pt. admits to 1 glass only on special occasions like BDay etc.     History  Drug Use No    History   Social History  . Marital Status: Legally Separated    Spouse Name: N/A  . Number of Children: N/A  . Years of Education: N/A   Social History Main Topics  . Smoking status: Current Every Day Smoker -- 1.00 packs/day    Types: Cigarettes    Last Attempt to Quit: 10/22/2012  . Smokeless tobacco: Never Used     Comment: Pt. is not ready to quit/ no interest in cessation materials/ pt. knows danger of smoking  . Alcohol Use: No     Comment: Pt.  admits to 1 glass only on special occasions like BDay etc.  . Drug Use: No  . Sexual Activity: Yes    Birth Control/ Protection: Surgical, Condom     Comment: hysterectomy   Other Topics Concern  . None   Social History Narrative   Additional History:    Sleep: Fair  Appetite:  Fair   Assessment:   Musculoskeletal: Strength & Muscle Tone: within normal limits Gait & Station: normal Patient leans: N/A   Psychiatric Specialty Exam: Physical Exam  Review of Systems  Constitutional: Positive for malaise/fatigue.  Eyes: Negative.   Respiratory: Negative.   Cardiovascular: Negative.   Gastrointestinal: Positive for heartburn.  Genitourinary: Negative.    Musculoskeletal: Positive for back pain and joint pain.  Skin: Negative.   Neurological: Positive for headaches.  Endo/Heme/Allergies: Negative.   Psychiatric/Behavioral: Positive for depression. The patient is nervous/anxious.     Blood pressure 113/62, pulse 116, temperature 97.6 F (36.4 C), temperature source Oral, resp. rate 16, height 5\' 4"  (1.626 m), weight 108.41 kg (239 lb).Body mass index is 41 kg/(m^2).  General Appearance: Fairly Groomed  Patent attorneyye Contact::  Fair  Speech:  Clear and Coherent  Volume:  Decreased  Mood:  Anxious and Depressed  Affect:  Tearful  Thought Process:  Coherent and Goal Directed  Orientation:  Full (Time, Place, and Person)  Thought Content:  symptoms events worries concerns  Suicidal Thoughts:  No  Homicidal Thoughts:  No  Memory:  Immediate;   Fair Recent;   Fair Remote;   Fair  Judgement:  Fair  Insight:  Present  Psychomotor Activity:  Restlessness  Concentration:  Fair  Recall:  [employer]is[employer]Fair  Fund of Knowledge:Fair  Language: Fair  Akathisia:  No  Handed:  Right  AIMS (if indicated):     Assets:  Desire for Improvement Housing Social Support  ADL's:  Intact  Cognition: WNL  Sleep:  Number of Hours: 6.25     Current Medications: Current Facility-Administered Medications  Medication Dose Route Frequency Provider Last Rate Last Dose  . acetaminophen (TYLENOL) tablet 650 mg  650 mg Oral Q6H PRN Dayna BarkerIjeoma Eugenia Nwaeze, NP      . alum & mag hydroxide-simeth (MAALOX/MYLANTA) 200-200-20 MG/5ML suspension 30 mL  30 mL Oral Q4H PRN Dayna BarkerIjeoma Eugenia Nwaeze, NP      . ARIPiprazole (ABILIFY) tablet 20 mg  20 mg Oral Daily Dayna BarkerIjeoma Eugenia Nwaeze, NP   20 mg at 12/29/14 0800  . buPROPion (WELLBUTRIN XL) 24 hr tablet 150 mg  150 mg Oral Daily Dayna BarkerIjeoma Eugenia Nwaeze, NP   150 mg at 12/29/14 0801  . buPROPion (WELLBUTRIN XL) 24 hr tablet 300 mg  300 mg Oral Daily Dayna BarkerIjeoma Eugenia Nwaeze, NP   300 mg at 12/29/14 0800  . clonazePAM (KLONOPIN) tablet 1 mg  1 mg  Oral BID Dayna BarkerIjeoma Eugenia Nwaeze, NP   1 mg at 12/29/14 1702  . gabapentin (NEURONTIN) capsule 300 mg  300 mg Oral TID Dayna BarkerIjeoma Eugenia Nwaeze, NP   300 mg at 12/29/14 1702  . levothyroxine (SYNTHROID, LEVOTHROID) tablet 100 mcg  100 mcg Oral QAC breakfast Dayna BarkerIjeoma Eugenia Nwaeze, NP   100 mcg at 12/29/14 [id number]  . liothyronine (CYTOMEL) tablet 5 mcg  5 mcg Oral Daily Dayna BarkerIjeoma Eugenia Nwaeze, NP   5 mcg at 12/29/14 0800  . lip balm (CARMEX) ointment   Topical PRN Canary BrimMary W Larach, NP      . lisinopril (PRINIVIL,ZESTRIL) tablet 10 mg  10 mg Oral Daily Sanjuana KavaAgnes I Nwoko, NP  10 mg at 12/29/14 0801  . magnesium hydroxide (MILK OF MAGNESIA) suspension 30 mL  30 mL Oral Daily PRN Dayna Barker, NP      . meclizine (ANTIVERT) tablet 25 mg  25 mg Oral BID PRN Dayna Barker, NP   25 mg at 12/28/14 1608  . pantoprazole (PROTONIX) EC tablet 40 mg  40 mg Oral Daily Dayna Barker, NP   40 mg at 12/29/14 0801  . propranolol (INDERAL) tablet 80 mg  80 mg Oral TID Dayna Barker, NP   80 mg at 12/29/14 1702  . simvastatin (ZOCOR) tablet 40 mg  40 mg Oral q1800 Dayna Barker, NP   40 mg at 12/29/14 1711  . SUMAtriptan (IMITREX) tablet 25 mg  25 mg Oral Q2H PRN Dayna Barker, NP   25 mg at 12/28/14 2119  . traMADol (ULTRAM) tablet 50 mg  50 mg Oral Q6H PRN Dayna Barker, NP   50 mg at 12/29/14 1145  . traZODone (DESYREL) tablet 100 mg  100 mg Oral QHS Dayna Barker, NP   100 mg at 12/28/14 2119    Lab Results: No results found for this or any previous visit (from the past 48 hour(s)).  Physical Findings: AIMS: Facial and Oral Movements Muscles of Facial Expression: None, normal Lips and Perioral Area: None, normal Jaw: None, normal Tongue: None, normal,Extremity Movements Upper (arms, wrists, hands, fingers): None, normal Lower (legs, knees, ankles, toes): None, normal, Trunk Movements Neck, shoulders, hips: None, normal, Overall Severity Patient's awareness  of abnormal movements (rate only patient's report): No Awareness, Dental Status Current problems with teeth and/or dentures?: Yes (upper and lower) Does patient usually wear dentures?: Yes  CIWA:    COWS:     Treatment Plan Summary: Daily contact with patient to assess and evaluate symptoms and progress in treatment and Medication management Bipolar Depressed; reassess and optimize response to the psychotropic agents                                 Work on healthier ways of dealing with the loss of the relationship                                 Overall, explore better ways of managing her mood Hypothyroidism: optimize response to the thyroid replacement given her high TSH  Medical Decision Making:  Review of Psycho-Social Stressors (1), Review or order clinical lab tests (1), Review of Medication Regimen & Side Effects (2) and Review of New Medication or Change in Dosage (2)     Timmie Dugue A 12/29/2014, 7:19 PM

## 2014-12-29 NOTE — Progress Notes (Signed)
D: Pt's mood is depressed but she brightens upon approach. Eye contact is fair. Pt states her day was good.  A: Support given. Verbalization encouraged. Pt encouraged to come to nurse with any concerns. Medications given as prescribed. R: Pt is receptive. No complaints of pain or discomfort. Q15 min safety checks maintained. Will continue to monitor.

## 2014-12-29 NOTE — Progress Notes (Signed)
D: Patient appropriate and cooperative with staff and peers. Sad at times, brightens on approach. Patient has depressed affect and mood. She reported on the self inventory sheet that her sleep, appetite and ability to concentrate are all good and energy level is normal. Patient rates depression "7", feelings of hopelessness "4" and anxiety "6". Her goal is to stay positive and talk more in groups today. Adheres to medication regimen.   A: Support and encouragement provided to patient. Administered medications per ordering MD. Monitor Q15 minute checks for safety.   R: Patient receptive. Denies SI/HI and AVH. Patient remains safe on the unit.

## 2014-12-30 NOTE — Progress Notes (Signed)
Dr. Dub MikesLugo made aware on 12/30/14 at 0907 hrs of TSH LEVEL on 12/27/14 because it was not time marked.

## 2014-12-30 NOTE — [facility] Group Notes (Signed)
[facility] LCSW Group Therapy  12/30/2014 4:16 PM  Type of Therapy:  Group Therapy  Participation Level:  Active  Participation Quality:  Attentive  Affect:  Appropriate  Cognitive:  Alert and Oriented  Insight:  Engaged  Engagement in Therapy:  Engaged  Modes of Intervention:  Confrontation, Discussion, Education, Exploration, Problem-solving, Rapport Building, Socialization and Support  Summary of Progress/Problems: Feelings around Relapse. Group members discussed the meaning of relapse and shared personal stories of relapse, how it affected them and others, and how they perceived themselves during this time. Group members were encouraged to identify triggers, warning signs and coping skills used when facing the possibility of relapse. Social supports were discussed and explored in detail. Post Acute Withdrawal Syndrome (handout provided) was introduced and examined. Pt's were encouraged to ask questions, talk about key points associated with PAWS, and process this information in terms of relapse prevention. [name] was attentive and engaged during today's processing group. She shared that she plans to get back into AA and support groups. Pt reports family stressors that are a trigger for her at times and her need to address these triggers. [name] shared her concerns regarding PAWS and a need for developing a safety plan.    Smart, Ma Munoz LCSWA  12/30/2014, 4:16 PM

## 2014-12-30 NOTE — [facility] Suicide Risk Assessment (Signed)
[facility] INPATIENT:  Family/Significant Other Suicide Prevention Education  Suicide Prevention Education:  Education Completed; [name] (pt's mother) [phone] has been identified by the patient as the family member/significant other with whom the patient will be residing, and identified as the person(s) who will aid the patient in the event of a mental health crisis (suicidal ideations/suicide attempt).  With written consent from the patient, the family member/significant other has been provided the following suicide prevention education, prior to the and/or following the discharge of the patient.  The suicide prevention education provided includes the following:  Suicide risk factors  Suicide prevention and interventions  National Suicide Hotline telephone number  [facility] assessment telephone number  [facility] 911  [facility] and/or [facility]idential Mobile Crisis Unit telephone number  Request made of family/significant other to:  Remove weapons (e.g., guns, rifles, knives), all items previously/currently identified as safety concern.    Remove drugs/medications (over-the-counter, prescriptions, illicit drugs), all items previously/currently identified as a safety concern.  The family member/significant other verbalizes understanding of the suicide prevention education information provided.  The family member/significant other agrees to remove the items of safety concern listed above.  Smart, Kiyana Vazguez LCSWA 12/30/2014, 3:42 PM

## 2014-12-30 NOTE — [facility] Group Notes (Signed)
Pt stated she is discharging tomorrow and her plan is to avoid her boyfriend if he tries to reach out to her.  Javel Hersh Cathey, MHT

## 2014-12-30 NOTE — Progress Notes (Signed)
D   Pt is pleasant on approach  And cooperative with treatment     She interacts appropriately with staff and peers   She endorses some depression and sadness related to her loss of a relationship but also feels like she is starting to accept the loss A   Verbal support given   Medications administered and effectiveness monitored   Q 15 min checks R   Pt safe at present

## 2014-12-30 NOTE — Progress Notes (Signed)
[facility] MD Progress Note  12/30/2014 4:30 PM [name]  MRN:  [id number] Subjective:  States she is starting to feel better. Her BP has been on the low side and her Inderal has been held. She states that she is comfortable with the decisions she has made in terms of the relationship with her BP. She wants a clean break  but he lives not far from her. She will probably run into him on one occasion or another. States she has her family and neighbors support.  Principal Problem: Bipolar I disorder, most recent episode depressed Diagnosis:   Patient Active Problem List   Diagnosis Date Noted  . Right shoulder pain [M25.511] 09/09/2014  . New onset of headaches [R51] 08/12/2014  . Migraine headache [G43.909] 08/12/2014  . Abdominal pain [R10.9] 08/12/2014  . AKI (acute kidney injury) [N17.9] 08/12/2014  . Vertigo [R42] 05/21/2014  . Screen for STD (sexually transmitted disease) [Z11.3] 03/11/2014  . Insomnia [G47.00] 03/11/2014  . Orthostatic hypotension [I95.1] 01/29/2014  . Chronic low back pain [M54.5, G89.29] 09/11/2013  . Frequent falls [R29.6] 09/02/2013  . Preventative health care [Z00.00] 08/16/2013  . Right knee pain [M25.561] 08/14/2012  . Tobacco abuse [Z72.0] 08/14/2012  . Menopausal symptoms [N95.1] 03/16/2012  . History of DVT of lower extremity [Z86.718] 03/16/2012  . HYPERCHOLESTEROLEMIA, PURE [E78.0] 04/10/2007  . RHINITIS, ALLERGIC NOS [J30.9] 04/10/2007  . GERD [K21.9] 04/10/2007  . Hypothyroidism [E03.9] 01/17/2007  . Bipolar I disorder, most recent episode depressed [F31.30] 01/17/2007  . OSTEOARTHRITIS [M19.90] 01/17/2007   Total Time spent with patient: 30 minutes   Past Medical History:  Past Medical History  Diagnosis Date  . Deep venous thrombosis of leg 2012    completed year of coumadin   . Hx of measles   . Migraines   . Monilia infection 12/31/10  . Vaginal atrophy 03/04/11  . Dyspareunia 03/04/11  . Hypothyroidism     Hypothyroidism since 8th grade.   Managed by Dr. Sharl Ma, Endocrinology.  On synthroid.  No prior thyroid surgery/ablation.   . Bipolar 1 disorder     Followed by Mental Health.  All psychiatric medications prescribed by Mental Health.  Stable for many years.      Past Surgical History  Procedure Laterality Date  . 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 Cesarean section  1998  . Tonsillectomy    . Wisdom tooth extraction    . Cholecystectomy    . Left elbow    . Abdominal hysterectomy  2000  . Appendectomy     Family History:  Family History  Problem Relation Age of Onset  . Cancer Maternal Aunt   . Other Father    Social History:  History  Alcohol Use No    Comment: Pt. admits to 1 glass only on special occasions like BDay etc.     History  Drug Use No    History   Social History  . Marital Status: Legally Separated    Spouse Name: N/A  . Number of Children: N/A  . Years of Education: N/A   Social History Main Topics  . Smoking status: Current Every Day Smoker -- 1.00 packs/day    Types: Cigarettes    Last Attempt to Quit: 10/22/2012  . Smokeless tobacco: Never Used     Comment: Pt. is not ready to quit/ no interest in cessation materials/ pt. knows danger of smoking  . Alcohol Use: No     Comment: Pt.  admits to 1 glass only on special occasions like BDay etc.  . Drug Use: No  . Sexual Activity: Yes    Birth Control/ Protection: Surgical, Condom     Comment: hysterectomy   Other Topics Concern  . None   Social History Narrative   Additional History:    Sleep: Fair  Appetite:  Fair   Assessment:   Musculoskeletal: Strength & Muscle Tone: within normal limits Gait & Station: normal Patient leans: N/A   Psychiatric Specialty Exam: Physical Exam  Review of Systems  Constitutional: Positive for malaise/fatigue.  HENT: Negative.   Eyes: Negative.   Respiratory: Negative.   Cardiovascular: Negative.   Gastrointestinal: Negative.   Genitourinary: Negative.    Musculoskeletal: Positive for joint pain.  Skin: Negative.   Neurological: Positive for weakness.  Endo/Heme/Allergies: Negative.   Psychiatric/Behavioral: Positive for depression.    Blood pressure 97/62, pulse 64, temperature 97.8 F (36.6 C), temperature source Oral, resp. rate 16, height  (1.626 m), weight 108.41 kg (239 lb).Body mass index is 41 kg/(m^2).  General Appearance: Fairly Groomed  Patent attorney::  Fair  Speech:  Clear and Coherent  Volume:  Decreased  Mood:  sad  Affect:  Restricted  Thought Process:  Coherent and Goal Directed  Orientation:  Full (Time, Place, and Person)  Thought Content:  symptoms events worries concerns  Suicidal Thoughts:  No  Homicidal Thoughts:  No  Memory:  Immediate;   Fair Recent;   Fair Remote;   Fair  Judgement:  Fair  Insight:  Present  Psychomotor Activity:  Restlessness  Concentration:  Fair  Recall:  [employer] of Knowledge:Fair  Language: Fair  Akathisia:  No  Handed:  Right  AIMS (if indicated):     Assets:  Desire for Improvement Housing Social Support Transportation  ADL's:  Intact  Cognition: WNL  Sleep:  Number of Hours: 6.25     Current Medications: Current Facility-Administered Medications  Medication Dose Route Frequency Provider Last Rate Last Dose  . acetaminophen (TYLENOL) tablet 650 mg  650 mg Oral Q6H PRN Dayna Barker, NP      . alum & mag hydroxide-simeth (MAALOX/MYLANTA) 200-200-20 MG/5ML suspension 30 mL  30 mL Oral Q4H PRN Dayna Barker, NP      . ARIPiprazole (ABILIFY) tablet 20 mg  20 mg Oral Daily Dayna Barker, NP   20 mg at 12/30/14 0741  . buPROPion (WELLBUTRIN XL) 24 hr tablet 150 mg  150 mg Oral Daily Dayna Barker, NP   150 mg at 12/30/14 0741  . buPROPion (WELLBUTRIN XL) 24 hr tablet 300 mg  300 mg Oral Daily Dayna Barker, NP   300 mg at 12/30/14 0747  . clonazePAM (KLONOPIN) tablet 1 mg  1 mg Oral BID Dayna Barker, NP   1 mg at  12/30/14 0742  . gabapentin (NEURONTIN) capsule 300 mg  300 mg Oral TID Dayna Barker, NP   300 mg at 12/30/14 1139  . levothyroxine (SYNTHROID, LEVOTHROID) tablet 100 mcg  100 mcg Oral QAC breakfast Dayna Barker, NP   100 mcg at 12/30/14 0742  . liothyronine (CYTOMEL) tablet 5 mcg  5 mcg Oral Daily Dayna Barker, NP   5 mcg at 12/30/14 0617  . lip balm (CARMEX) ointment   Topical PRN Canary Brim, NP      . lisinopril (PRINIVIL,ZESTRIL) tablet 10 mg  10 mg Oral Daily Sanjuana Kava, NP   10 mg at 12/30/14  [id number]  . magnesium hydroxide (MILK OF MAGNESIA) suspension 30 mL  30 mL Oral Daily PRN Dayna BarkerIjeoma Eugenia Nwaeze, NP      . meclizine (ANTIVERT) tablet 25 mg  25 mg Oral BID PRN Dayna BarkerIjeoma Eugenia Nwaeze, NP   25 mg at 12/28/14 1608  . pantoprazole (PROTONIX) EC tablet 40 mg  40 mg Oral Daily Dayna BarkerIjeoma Eugenia Nwaeze, NP   40 mg at 12/30/14 0742  . propranolol (INDERAL) tablet 80 mg  80 mg Oral TID Dayna BarkerIjeoma Eugenia Nwaeze, NP   Stopped at 12/30/14 1200  . simvastatin (ZOCOR) tablet 40 mg  40 mg Oral q1800 Dayna BarkerIjeoma Eugenia Nwaeze, NP   40 mg at 12/29/14 1711  . SUMAtriptan (IMITREX) tablet 25 mg  25 mg Oral Q2H PRN Dayna BarkerIjeoma Eugenia Nwaeze, NP   25 mg at 12/28/14 2119  . traMADol (ULTRAM) tablet 50 mg  50 mg Oral Q6H PRN Dayna BarkerIjeoma Eugenia Nwaeze, NP   50 mg at 12/30/14 0742  . traZODone (DESYREL) tablet 100 mg  100 mg Oral QHS Dayna BarkerIjeoma Eugenia Nwaeze, NP   100 mg at 12/29/14 2125    Lab Results: No results found for this or any previous visit (from the past 48 hour(s)).  Physical Findings: AIMS: Facial and Oral Movements Muscles of Facial Expression: None, normal Lips and Perioral Area: None, normal Jaw: None, normal Tongue: None, normal,Extremity Movements Upper (arms, wrists, hands, fingers): None, normal Lower (legs, knees, ankles, toes): None, normal, Trunk Movements Neck, shoulders, hips: None, normal, Overall Severity Patient's awareness of abnormal movements (rate only patient's  report): No Awareness, Dental Status Current problems with teeth and/or dentures?: Yes (upper and lower) Does patient usually wear dentures?: Yes  CIWA:    COWS:     Treatment Plan Summary: Daily contact with patient to assess and evaluate symptoms and progress in treatment and Medication management Supportive approach/coping skills Major Depression: will continue to optimize response to the antidepressants. Will work on CBT, mindfulness Will continue to process the loss of the relationship with her BF Low BP; will reassess the use of the Inderal at the 80 mg TID strength  Medical Decision Making:  Review of Psycho-Social Stressors (1), Review of Medication Regimen & Side Effects (2) and Review of New Medication or Change in Dosage (2)     Danaly Bari A 12/30/2014, 4:30 PM

## 2014-12-30 NOTE — Progress Notes (Signed)
D: Patient denies SI/HI and A/V hallucinations; patient reports sleep is good; reports appetite is good; reports energy level is normal ; reports ability to concentrate is good; rates depression as 6/10; rates hopelessness 6/10; rates anxiety as 5/10; patient reports that blood sugar drops on her self inventory but patient does not report any episodes to day; patient reports some dizziness and blood pressure checked  A: Monitored q 15 minutes; patient encouraged to attend groups; patient educated about medications; patient given medications per physician orders; patient encouraged to express feelings and/or concerns; blood pressure was checked and Dr. Dub MikesLugo gave an order to hold the Inderal; encourage to drink fluids  R: Patient is cooperative; patient is drinking plenty of fluids and will continue to monitor the patient for any symptoms; patient's interaction with staff and peers is appropriate to circumstances; patient was able to set goal to talk with staff 1:1 when having feelings of SI; patient is taking medications as prescribed and tolerating medications; patient is attending all groups and engaging

## 2014-12-30 NOTE — [facility] Group Notes (Signed)
The focus of this group is to educate the patient on the purpose and policies of crisis stabilization and provide a format to answer questions about their admission.  The group details unit policies and expectations of patients while admitted. Patient attended 0900 nursing group. 

## 2014-12-30 NOTE — Progress Notes (Signed)
D   Pt is pleasant on approach  And cooperative with treatment     She interacts appropriately with staff and peers   She endorses some depression and sadness related to her loss of a relationship but also feels like she is starting to accept the loss   Pt reports she is ready to move on and is feeling more positive today A   Verbal support given   Medications administered and effectiveness monitored   Q 15 min checks   R   Pt safe at present

## 2014-12-30 NOTE — Progress Notes (Signed)
Recreation Therapy Notes  Animal-Assisted Activity/Therapy (AAA/T) Program Checklist/Progress Notes Patient Eligibility Criteria Checklist & Daily Group note for Rec Tx Intervention  Date: 03.01.2016 Time: 2:45pm [facility]: [address] [facility]   AAA/T Program Assumption of Risk Form signed by Patient/ or Parent Legal Guardian yes  Patient is free of allergies or sever asthma yes  Patient reports no fear of animals yes  Patient reports no history of cruelty to animals yes  Patient understands his/her participation is voluntary yes  Patient washes hands before animal contact yes  Patient washes hands after animal contact yes  Behavioral Response: Appropriate   Education: Hand Washing, Appropriate Animal Interaction   Education Outcome: Acknowledges education.   Clinical Observations/Feedback: Patient interacted appropriately with therapy dog, petting him appropriately from floor level, sharing stories about her pets at home with group and asking appropriate questions about therapy dog and his training.   Marykay Lexenise L Trejon Duford, LRT/CTRS  Jearl KlinefelterBlanchfield, Harshika Mago L 12/30/2014 7:43 PM

## 2014-12-31 ENCOUNTER — Encounter (HOSPITAL_COMMUNITY): Payer: Self-pay | Admitting: Registered Nurse

## 2014-12-31 MED ORDER — TRAZODONE HCL 100 MG PO TABS: 100.0000 mg | ORAL_TABLET | Freq: Every day | ORAL | Status: DC

## 2014-12-31 MED ORDER — GABAPENTIN 300 MG PO CAPS: 300.0000 mg | ORAL_CAPSULE | Freq: Three times a day (TID) | ORAL | Status: DC

## 2014-12-31 MED ORDER — LISINOPRIL 10 MG PO TABS: 10.0000 mg | ORAL_TABLET | Freq: Every day | ORAL | Status: DC

## 2014-12-31 MED ORDER — BUPROPION HCL ER (XL) 300 MG PO TB24: ORAL_TABLET | ORAL | Status: DC

## 2014-12-31 MED ORDER — BUPROPION HCL ER (XL) 150 MG PO TB24: 150.0000 mg | ORAL_TABLET | Freq: Every day | ORAL | Status: DC

## 2014-12-31 MED ORDER — LEVOTHYROXINE SODIUM 100 MCG PO TABS: 100.0000 ug | ORAL_TABLET | Freq: Every day | ORAL | Status: DC

## 2014-12-31 MED ORDER — CLONAZEPAM 1 MG PO TABS: 1.0000 mg | ORAL_TABLET | Freq: Two times a day (BID) | ORAL | Status: DC

## 2014-12-31 MED ORDER — TRAMADOL HCL 50 MG PO TABS: 50.0000 mg | ORAL_TABLET | Freq: Four times a day (QID) | ORAL | Status: DC | PRN

## 2014-12-31 MED ORDER — PROPRANOLOL HCL 80 MG PO TABS: 80.0000 mg | ORAL_TABLET | Freq: Three times a day (TID) | ORAL | Status: DC

## 2014-12-31 MED ORDER — SIMVASTATIN 40 MG PO TABS: 40.0000 mg | ORAL_TABLET | Freq: Every morning | ORAL | Status: DC

## 2014-12-31 MED ORDER — PANTOPRAZOLE SODIUM 40 MG PO TBEC: 40.0000 mg | DELAYED_RELEASE_TABLET | Freq: Every day | ORAL | Status: DC

## 2014-12-31 MED ORDER — LIOTHYRONINE SODIUM 5 MCG PO TABS: 5.0000 ug | ORAL_TABLET | Freq: Every day | ORAL | Status: DC

## 2014-12-31 MED ORDER — ARIPIPRAZOLE 20 MG PO TABS: 20.0000 mg | ORAL_TABLET | Freq: Every day | ORAL | Status: DC

## 2014-12-31 NOTE — B[facility] Group Notes (Signed)
[facility] LCS[facility] Discharge Planning Group Note   12/31/2014 9:42 AM  Participation Quality:  Appropriate   Mood/Affect:  Appropriate  Depression Rating:  2  Anxiety Rating:  3  Thoughts of Suicide:  No Will you contract for safety?   NA  Current AVH:  No  Plan for Discharge/Comments:  Pt d/cing today. No withdrawals. Pleasant and cooperative. Pt has followup with [facility] and was instructed to contact her therapist at d/c to secure appt. CSW left message and has not heard back.   Transportation Means: mom coming after lunch.   Supports: mother/some family supports.   Smart, American FinancialHeather LCSWA

## 2014-12-31 NOTE — Progress Notes (Signed)
Patient is discharged per physician order; patient denies SI/HI and A/V hallucinations; patient received prescriptions and copy of AVS after it was reviewed; patient had no other questions or concerns at this time; patient verbalized and signed that they received all belongings; patient left the unit ambulatory with mother

## 2014-12-31 NOTE — B[facility] Suicide Risk Assessment (Signed)
[facility]   Demographic Factors:  Caucasian  Total Time spent with patient: 30 minutes  Musculoskeletal: Strength & Muscle Tone: within normal limits Gait & Station: normal Patient leans: N/A  Psychiatric Specialty Exam: Physical Exam  Review of Systems  Constitutional: Negative.   HENT: Negative.   Eyes: Negative.   Respiratory: Negative.   Cardiovascular: Negative.   Gastrointestinal: Negative.   Genitourinary: Negative.   Musculoskeletal: Positive for joint pain.  Skin: Negative.   Neurological: Negative.   Endo/Heme/Allergies: Negative.   Psychiatric/Behavioral: The patient is nervous/anxious.     Blood pressure 108/74, pulse 74, temperature 97.6 F (36.4 C), temperature source Oral, resp. rate 16, height 5\' 4"  (1.626 m), weight 108.41 kg (239 lb).Body mass index is 41 kg/(m^2).  General Appearance: Fairly Groomed  Patent attorneyye Contact::  Fair  Speech:  Clear and Coherent409  Volume:  Normal  Mood:  Anxious  Affect:  Restricted  Thought Process:  Coherent and Goal Directed  Orientation:  Full (Time, Place, and Person)  Thought Content:  plans as she moves on  Suicidal Thoughts:  No  Homicidal Thoughts:  No  Memory:  Immediate;   Fair Recent;   Fair Remote;   Fair  Judgement:  Fair  Insight:  Fair  Psychomotor Activity:  Normal  Concentration:  Fair  Recall:  FiservFair  Fund of Knowledge:Fair  Language: Fair  Akathisia:  No  Handed:  Right  AIMS (if indicated):     Assets:  Desire for Improvement Housing  Sleep:  Number of Hours: 6  Cognition: WNL  ADL's:  Intact   Have you used any form of tobacco in the last 30 days? (Cigarettes, Smokeless Tobacco, Cigars, and/or Pipes): Yes  Has this patient used any form of tobacco in the last 30 days? (Cigarettes, Smokeless Tobacco, Cigars, and/or Pipes) Yes, A prescription for an FDA-approved tobacco cessation medication was offered at discharge and the patient refused  Mental Status Per Nursing  Assessment::   On Admission:  NA  Current Mental Status by Physician: In full contact with reality. There are no active SI plans or intent. She states she is feeling much better than when she came in. She is going back home. The main challenge is going to be seeing his ex BF when he comes around as he lives nearby. States she has learned new and better coping skills while here this time around. Feels she is going to do just fine. She is also aware of the conflictive interactions with her mother but states she is not going to led them affect her    Loss Factors: Loss of significant relationship  Historical Factors: NA  Risk Reduction Factors:   Sense of responsibility to family, Living with another person, especially a relative and Positive social support  Continued Clinical Symptoms:  Bipolar Disorder:   Depressive phase  Cognitive Features That Contribute To Risk:  Closed-mindedness, Polarized thinking and Thought constriction (tunnel vision)    Suicide Risk:  Minimal: No identifiable suicidal ideation.  Patients presenting with no risk factors but with morbid ruminations; may be classified as minimal risk based on the severity of the depressive symptoms  Principal Problem: Bipolar I disorder, most recent episode depressed Discharge Diagnoses:  Patient Active Problem List   Diagnosis Date Noted  . Right shoulder pain [M25.511] 09/09/2014  . New onset of headaches [R51] 08/12/2014  . Migraine headache [G43.909] 08/12/2014  . Abdominal pain [R10.9] 08/12/2014  . AKI (acute kidney injury) [N17.9] 08/12/2014  .  Vertigo [R42] 05/21/2014  . Screen for STD (sexually transmitted disease) [Z11.3] 03/11/2014  . Insomnia [G47.00] 03/11/2014  . Orthostatic hypotension [I95.1] 01/29/2014  . Chronic low back pain [M54.5, G89.29] 09/11/2013  . Frequent falls [R29.6] 09/02/2013  . Preventative health care [Z00.00] 08/16/2013  . Right knee pain [M25.561] 08/14/2012  . Tobacco abuse [Z72.0]  08/14/2012  . Menopausal symptoms [N95.1] 03/16/2012  . History of DVT of lower extremity [Z86.718] 03/16/2012  . HYPERCHOLESTEROLEMIA, PURE [E78.0] 04/10/2007  . RHINITIS, ALLERGIC NOS [J30.9] 04/10/2007  . GERD [K21.9] 04/10/2007  . Hypothyroidism [E03.9] 01/17/2007  . Bipolar I disorder, most recent episode depressed [F31.30] 01/17/2007  . OSTEOARTHRITIS [M19.90] 01/17/2007    Follow-up Information    Follow up with [facility] On 01/08/2015.   Why:  Appt on this date at 12:00PM with Deatra Robinson for hospital follow-up/medication management.    Contact information:   [address] [place], [place] [address] Phone: [phone] Fax: [phone]      Follow up with Molly Maduro "Nadine Counts" [place] LCSW-[facility].   Why:  Left message requesting appt time/date. Please call Nadine Counts at discharge to secure appt time and date. Thanks!    Contact information:   [address] [place], [place] [address] Phone: [phone] Fax: [phone]      Plan Of Care/Follow-up recommendations:  Activity:  as tolerated Diet:  regular  Follow up [facility] and [employer] as above Is patient on multiple antipsychotic therapies at discharge:  No   Has Patient had three or more failed trials of antipsychotic monotherapy by history:  No  Recommended Plan for Multiple Antipsychotic Therapies: NA    Jovonni Borquez A 12/31/2014, 12:35 PM

## 2014-12-31 NOTE — Discharge Summary (Signed)
Physician Discharge Summary Note  Patient:  [name] is an 44 y.o., female MRN:  [id number] DOB:  1971-08-22 Patient phone:  [phone] (home)  Patient address:   [address] [place] [address],  Total Time spent with patient: Greater than 30 minutes  Date of Admission:  12/26/2014 Date of Discharge: 12/31/2014  Reason for Admission:  Per H&P admission:  [name] is a 44 year old Caucasian female admitted to [facility] as a walk[facility]in & [facility]. She reports, "I have Bipolar disorder with depression. I take medicine for it. The medicines are working, however, things has been building & piling up that I can no longer handle them. I have been lashing out at & bickering with my mother. My boyfriend & I are having problems. He has nothing nice to say about anything. He abuses alcohol & weed, and I don't do any of those things. He has been getting on my nerves. I have been feeling this way x 2 months, my depression has been worsening in the last 4 months. My symptoms even worsened more in the last 4 days. I have been on Abilify 15 mg, Wellbutrin 450 mg & Neurontin 300 mg tid. I would want to remain on these medicines because they do work for me. I came here to learn some coping skills to help me deal with my problems".   Principal Problem: Bipolar I disorder, most recent episode depressed Discharge Diagnoses: Patient Active Problem List   Diagnosis Date Noted  . Right shoulder pain [M25.511] 09/09/2014  . New onset of headaches [R51] 08/12/2014  . Migraine headache [G43.909] 08/12/2014  . Abdominal pain [R10.9] 08/12/2014  . AKI (acute kidney injury) [N17.9] 08/12/2014  . Vertigo [R42] 05/21/2014  . Screen for STD (sexually transmitted disease) [Z11.3] 03/11/2014  . Insomnia [G47.00] 03/11/2014  . Orthostatic hypotension [I95.1] 01/29/2014  . Chronic low back pain [M54.5, G89.29] 09/11/2013  . Frequent falls [R29.6] 09/02/2013  . Preventative health  care [Z00.00] 08/16/2013  . Right knee pain [M25.561] 08/14/2012  . Tobacco abuse [Z72.0] 08/14/2012  . Menopausal symptoms [N95.1] 03/16/2012  . History of DVT of lower extremity [Z86.718] 03/16/2012  . HYPERCHOLESTEROLEMIA, PURE [E78.0] 04/10/2007  . RHINITIS, ALLERGIC NOS [J30.9] 04/10/2007  . GERD [K21.9] 04/10/2007  . Hypothyroidism [E03.9] 01/17/2007  . Bipolar I disorder, most recent episode depressed [F31.30] 01/17/2007  . OSTEOARTHRITIS [M19.90] 01/17/2007    Musculoskeletal: Strength & Muscle Tone: within normal limits Gait & Station: normal Patient leans: N/A  Psychiatric Specialty Exam: See Suicide Risk Assessment Physical Exam  Review of Systems  Psychiatric/Behavioral: Negative for suicidal ideas, hallucinations, memory loss and substance abuse. Depression: stable. Nervous/anxious: stable. Insomnia: stable.     Blood pressure 108/74, pulse 74, temperature 97.6 F (36.4 C), temperature source Oral, resp. rate 16, height  (1.626 m), weight 108.41 kg (239 lb).Body mass index is 41 kg/(m^2).    Past Medical History:  Past Medical History  Diagnosis Date  . Deep venous thrombosis of leg 2012    completed year of coumadin   . Hx of measles   . Migraines   . Monilia infection 12/31/10  . Vaginal atrophy 03/04/11  . Dyspareunia 03/04/11  . Hypothyroidism     Hypothyroidism since 8th grade.  Managed by Dr. Sharl Ma, Endocrinology.  On synthroid.  No prior thyroid surgery/ablation.   . Bipolar 1 disorder     Followed by Mental Health.  All psychiatric medications prescribed by Mental Health.  Stable for many years.      Past Surgical History  Procedure Laterality Date  . 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 Cesarean section  1998  . Tonsillectomy    . [place] tooth extraction    . Cholecystectomy    . Left elbow    . Abdominal hysterectomy  2000  . Appendectomy     Family History:  Family History  Problem Relation Age of Onset  . Cancer Maternal Aunt    . Other Father    Social History:  History  Alcohol Use No    Comment: Pt. admits to 1 glass only on special occasions like BDay etc.     History  Drug Use No    History   Social History  . Marital Status: Legally Separated    Spouse Name: N/A  . Number of Children: N/A  . Years of Education: N/A   Social History Main Topics  . Smoking status: Current Every Day Smoker -- 1.00 packs/day    Types: Cigarettes    Last Attempt to Quit: 10/22/2012  . Smokeless tobacco: Never Used     Comment: Pt. is not ready to quit/ no interest in cessation materials/ pt. knows danger of smoking  . Alcohol Use: No     Comment: Pt. admits to 1 glass only on special occasions like BDay etc.  . Drug Use: No  . Sexual Activity: Yes    Birth Control/ Protection: Surgical, Condom     Comment: hysterectomy   Other Topics Concern  . None   Social History Narrative    Risk to Self: Is patient at risk for suicide?: No What has been your use of drugs/alcohol within the last 12 months?: Alcohol on New Year's and birthdays Risk to Others:   Prior Inpatient Therapy:   Prior Outpatient Therapy:    Level of Care:  OP  Hospital Course:  [name] was admitted for Bipolar I disorder, most recent episode depressed and crisis management.  She was treated discharged with the medications listed below under Medication List.  Medical problems were identified and treated as needed.  Home medications were restarted as appropriate.  Improvement was monitored by observation and [name] daily report of symptom reduction.  Emotional and mental status was monitored by daily self-inventory reports completed by [name] and clinical staff.         [name] was evaluated by the treatment team for stability and plans for continued recovery upon discharge.  [name] motivation was an integral factor for scheduling further treatment.  Employment, transportation, bed  availability, health status, family support, and any pending legal issues were also considered during her hospital stay.  She was offered further treatment options upon discharge including but not limited to Residential, Intensive Outpatient, and Outpatient treatment.  [name] will follow up with the services as listed below under Follow Up Information.     Upon completion of this admission the patient was both mentally and medically stable for discharge denying suicidal/homicidal ideation, auditory/visual/tactile hallucinations, delusional thoughts and paranoia.      Consults:  psychiatry  Significant Diagnostic Studies:  labs: TSH, UDS, CBC/Diff, CMET, ETOH  Discharge Vitals:   Blood pressure 108/74, pulse 74, temperature 97.6 F (36.4 C), temperature source Oral, resp. rate 16, height  (1.626 m), weight 108.41 kg (239 lb). Body mass index is 41 kg/(m^2). Lab Results:   No results found  for this or any previous visit (from the past 72 hour(s)).  Physical Findings: AIMS: Facial and Oral Movements Muscles of Facial Expression: None, normal Lips and Perioral Area: None, normal Jaw: None, normal Tongue: None, normal,Extremity Movements Upper (arms, wrists, hands, fingers): None, normal Lower (legs, knees, ankles, toes): None, normal, Trunk Movements Neck, shoulders, hips: None, normal, Overall Severity Patient's awareness of abnormal movements (rate only patient's report): No Awareness, Dental Status Current problems with teeth and/or dentures?: Yes (upper and lower) Does patient usually wear dentures?: Yes  CIWA:    COWS:      See Psychiatric Specialty Exam and Suicide Risk Assessment completed by Attending Physician prior to discharge.  Discharge destination:  Home  Is patient on multiple antipsychotic therapies at discharge:  No   Has Patient had three or more failed trials of antipsychotic monotherapy by history:  No    Recommended Plan for Multiple  Antipsychotic Therapies: NA      Discharge Instructions    Activity as tolerated - No restrictions    Complete by:  As directed      Diet - low sodium heart healthy    Complete by:  As directed      Discharge instructions    Complete by:  As directed   Take all of you medications as prescribed by your mental healthcare provider.  Report any adverse effects and reactions from your medications to your outpatient provider promptly. Do not engage in alcohol and or illegal drug use while on prescription medicines. In the event of worsening symptoms call the crisis hotline, 911, and or go to the nearest emergency department for appropriate evaluation and treatment of symptoms. Follow-up with your primary care provider for your medical issues, concerns and or health care needs.   Keep all scheduled appointments.  If you are unable to keep an appointment call to reschedule.  Let the nurse know if you will need medications before next scheduled appointment.            Medication List    STOP taking these medications        cyclobenzaprine 5 MG tablet  Commonly known as:  FLEXERIL     HYDROcodone-acetaminophen 5-325 MG per tablet  Commonly known as:  NORCO/VICODIN     ondansetron 4 MG tablet  Commonly known as:  ZOFRAN      TAKE these medications      Indication   ARIPiprazole 20 MG tablet  Commonly known as:  ABILIFY  Take 1 tablet (20 mg total) by mouth at bedtime. For mood control   Indication:  bipolar disorder     buPROPion 150 MG 24 hr tablet  Commonly known as:  WELLBUTRIN XL  Take 1 tablet (150 mg total) by mouth daily. For depression   Indication:  Major Depressive Disorder     buPROPion 300 MG 24 hr tablet  Commonly known as:  WELLBUTRIN XL  Take (one 300 mg tablet) along with (one 150 mg tablet) daily for depression   Indication:  Major Depressive Disorder     clonazePAM 1 MG tablet  Commonly known as:  KLONOPIN  Take 1 tablet (1 mg total) by mouth 2 (two) times  daily. For anxiety   Indication:  anxiety     gabapentin 300 MG capsule  Commonly known as:  NEURONTIN  Take 1 capsule (300 mg total) by mouth 3 (three) times daily. For neuropathic pain   Indication:  Neuropathic Pain     levothyroxine 100 MCG  tablet  Commonly known as:  SYNTHROID, LEVOTHROID  Take 1 tablet (100 mcg total) by mouth daily before breakfast. For hypothyroidism   Indication:  Underactive Thyroid     liothyronine 5 MCG tablet  Commonly known as:  CYTOMEL  Take 1 tablet (5 mcg total) by mouth daily. For hypothyroidism   Indication:  Underactive Thyroid     lisinopril 10 MG tablet  Commonly known as:  PRINIVIL,ZESTRIL  Take 1 tablet (10 mg total) by mouth daily. For hypertension   Indication:  High Blood Pressure     meclizine 25 MG tablet  Commonly known as:  ANTIVERT  Take 1 tablet (25 mg total) by mouth 2 (two) times daily as needed for dizziness.   Indication:  Sensation of Spinning or Whirling     pantoprazole 40 MG tablet  Commonly known as:  PROTONIX  Take 1 tablet (40 mg total) by mouth daily. For reflux      propranolol 80 MG tablet  Commonly known as:  INDERAL  Take 1 tablet (80 mg total) by mouth 3 (three) times daily. For hypertension   Indication:  High Blood Pressure     rizatriptan 5 MG tablet  Commonly known as:  MAXALT  Take 1 tablet (5 mg total) by mouth once as needed for migraine. May repeat in 2 hours if needed      simvastatin 40 MG tablet  Commonly known as:  ZOCOR  Take 1 tablet (40 mg total) by mouth every morning.   Indication:  hyperlipidemia     traMADol 50 MG tablet  Commonly known as:  ULTRAM  Take 1 tablet (50 mg total) by mouth every 6 (six) hours as needed (pain).   Indication:  Moderate to Moderately Severe Pain     traZODone 100 MG tablet  Commonly known as:  DESYREL  Take 1 tablet (100 mg total) by mouth at bedtime. For sleep   Indication:  Trouble Sleeping       Follow-up Information    Follow up with [facility]  On 01/08/2015.   Why:  Appt on this date at 12:00PM with Deatra Robinson for hospital follow-up/medication management.    Contact information:   [address], [place] [address] Phone: [phone] Fax: [phone]      Follow up with Molly Maduro "Nadine Counts" [place] LCSW-[facility].   Why:  Left message requesting appt time/date. Please call Nadine Counts at discharge to secure appt time and date. Thanks!    Contact information:   [address] [place], [place] [address] Phone: [phone] Fax: [phone]      Follow-up recommendations:  Activity:  As tolerated Diet:  As tolerated  Comments:   Patient has been instructed to take medications as prescribed; and report adverse effects to outpatient provider.  Follow up with primary doctor for any medical issues and If symptoms recur report to nearest emergency or crisis hot line.    Total Discharge Time: Greater than 30 minutes  Signed: Assunta Found, FNP-BC 12/31/2014, 2:23 PM  I personally assessed the patient and formulated the plan Madie Reno A. Dub Mikes, M.D.

## 2014-12-31 NOTE — Progress Notes (Signed)
  [facility] Dis[facility] :  Will you be returning to the same living situation after discharge:  Yes,  return home At discharge, do you have transportation home?: Yes,  mother coming after lunch. Do you have the ability to pay for your medications: Yes,  [facility]  Release of information consent forms completed and submitted to Medical Records by CSW.   Patient to Follow up at: Follow-up Information    Follow up with [facility] On 01/08/2015.   Why:  Appt on this date at 12:00PM with Deatra RobinsonKaren Jones for hospital follow-up/medication management.    Contact information:   [address] [place], [place] [address] Phone: [phone] Fax: [phone]      Follow up with Molly Maduroobert "Nadine CountsBob" [place] LCSW-[facility].   Why:  Left message requesting appt time/date. Please call Nadine CountsBob at discharge to secure appt time and date. Thanks!    Contact information:   [address] [place], [place] [address] Phone: [phone] Fax: [phone]      Patient denies SI/HI: Yes,  during group/self report.    Safety Planning and Suicide Prevention discussed: Yes,  SPE completed with pt's mother. SPI pamphlet provided to pt and she was encouraged to share information with support network, ask questions, and talk about any concerns relating to SPE.  Have you used any form of tobacco in the last 30 days? (Cigarettes, Smokeless Tobacco, Cigars, and/or Pipes): Yes  Has patient been referred to the Quitline?: Patient refused referral  Smart, Lebron QuamHeather LCSWA 12/31/2014, 9:40 AM

## 2015-01-02 NOTE — Progress Notes (Signed)
Patient Discharge Instructions:  After Visit Summary (AVS):   Faxed to:  01/02/15 Discharge Summary Note:   Faxed to:  01/02/15 Psychiatric Admission Assessment Note:   Faxed to:  01/02/15 Suicide Risk Assessment - Discharge Assessment:   Faxed to:  01/02/15 Faxed/Sent to the Next Level Care provider:  01/02/15 [facility]axed to [facility] @ [phone] [facility] @ [phone]  Jerelene ReddenSheena E Galva, 01/02/2015, 3:42 PM

## 2015-01-07 ENCOUNTER — Encounter: Payer: Self-pay | Admitting: Internal Medicine

## 2015-01-07 ENCOUNTER — Ambulatory Visit (INDEPENDENT_AMBULATORY_CARE_PROVIDER_SITE_OTHER): Payer: Medicaid Other | Admitting: Internal Medicine

## 2015-01-07 VITALS — BP 106/68 | HR 59 | Temp 97.5°F | Ht 65.0 in | Wt 237.6 lb

## 2015-01-07 DIAGNOSIS — M545 Other chronic pain: Secondary | ICD-10-CM

## 2015-01-07 DIAGNOSIS — E031 Congenital hypothyroidism without goiter: Secondary | ICD-10-CM

## 2015-01-07 DIAGNOSIS — R42 Dizziness and giddiness: Secondary | ICD-10-CM

## 2015-01-07 DIAGNOSIS — Z0000 Encounter for general adult medical examination without abnormal findings: Secondary | ICD-10-CM

## 2015-01-07 DIAGNOSIS — G8929 Other chronic pain: Secondary | ICD-10-CM

## 2015-01-07 DIAGNOSIS — F3130 Bipolar disorder, current episode depressed, mild or moderate severity, unspecified: Secondary | ICD-10-CM

## 2015-01-07 LAB — BASIC METABOLIC PANEL WITH GFR
BUN: 13 mg/dL (ref 6–23)
CO2: 25 meq/L (ref 19–32)
Calcium: 9.2 mg/dL (ref 8.4–10.5)
Chloride: 105 mEq/L (ref 96–112)
Creat: 1.02 mg/dL (ref 0.50–1.10)
GFR, EST AFRICAN AMERICAN: 77 mL/min
GFR, Est Non African American: 67 mL/min
GLUCOSE: 67 mg/dL — AB (ref 70–99)
POTASSIUM: 4.1 meq/L (ref 3.5–5.3)
Sodium: 141 mEq/L (ref 135–145)

## 2015-01-07 LAB — CBC WITH DIFFERENTIAL/PLATELET
BASOS ABS: 0 10*3/uL (ref 0.0–0.1)
Basophils Relative: 0 % (ref 0–1)
Eosinophils Absolute: 0.1 10*3/uL (ref 0.0–0.7)
Eosinophils Relative: 1 % (ref 0–5)
HCT: 38.7 % (ref 36.0–46.0)
Hemoglobin: 13.4 g/dL (ref 12.0–15.0)
LYMPHS ABS: 2.8 10*3/uL (ref 0.7–4.0)
Lymphocytes Relative: 31 % (ref 12–46)
MCH: 30.5 pg (ref 26.0–34.0)
MCHC: 34.6 g/dL (ref 30.0–36.0)
MCV: 88.2 fL (ref 78.0–100.0)
MONO ABS: 0.5 10*3/uL (ref 0.1–1.0)
MONOS PCT: 5 % (ref 3–12)
MPV: 9.6 fL (ref 8.6–12.4)
NEUTROS PCT: 63 % (ref 43–77)
Neutro Abs: 5.7 10*3/uL (ref 1.7–7.7)
Platelets: 268 10*3/uL (ref 150–400)
RBC: 4.39 MIL/uL (ref 3.87–5.11)
RDW: 14.3 % (ref 11.5–15.5)
WBC: 9.1 10*3/uL (ref 4.0–10.5)

## 2015-01-07 MED ORDER — TRAMADOL HCL 50 MG PO TABS: 50.0000 mg | ORAL_TABLET | Freq: Four times a day (QID) | ORAL | Status: DC | PRN

## 2015-01-07 MED ORDER — MECLIZINE HCL 25 MG PO TABS: 25.0000 mg | ORAL_TABLET | Freq: Two times a day (BID) | ORAL | Status: DC | PRN

## 2015-01-07 MED ORDER — LEVOTHYROXINE SODIUM 125 MCG PO TABS: 125.0000 ug | ORAL_TABLET | Freq: Every day | ORAL | Status: DC

## 2015-01-07 NOTE — Progress Notes (Signed)
Medicine attending: Medical history, presenting problems, physical findings, and medications, reviewed with Dr Kathryn Glenn and I concur with her evaluation and management plan. 

## 2015-01-07 NOTE — Assessment & Plan Note (Signed)
Pt missed her appt with Orthopedics while [facility]. She is to call to reschedule. - Refilling Tramadol 50mg  TID PRN, disp #150, 0 refills

## 2015-01-07 NOTE — Assessment & Plan Note (Signed)
Pt on Synthroid 100mcg and Cytomel 5mg  daily for her hypothyroidism. She is followed by Dr. Sharl MaKerr with Endocrinology but owes him $300 and cannot afford to pay him right now. [facility] 13.094 on 2/27.  - Increasing Synthroid to 125mcg daily - Advised to f/u with Dr. Sharl MaKerr - Return to the clinic in 4-6 weeks for repeat TSH

## 2015-01-07 NOTE — Assessment & Plan Note (Addendum)
[facility] for [facility]. She states that she is feeling better and is compliant with her Wellbutrin, Trazodone, Abilify, and Klonopin. She denies SI or mood changes since discharge.

## 2015-01-07 NOTE — Patient Instructions (Signed)
Begin taking the Synthroid 125mcg daily.  Return to the clinic in 4-6 weeks for repeat TSH. We may need to further increase your Synthroid dose.     General Instructions:   Please bring your medicines with you each time you come to clinic.  Medicines may include prescription medications, over-the-counter medications, herbal remedies, eye drops, vitamins, or other pills.   Progress Toward Treatment Goals:  Treatment Goal 10/22/2014  Stop smoking smoking the same amount  Prevent falls -    Self Care Goals & Plans:  Self Care Goal 12/03/2014  Manage my medications take my medicines as prescribed; bring my medications to every visit; refill my medications on time; follow the sick day instructions if I am sick  Monitor my health -  Eat healthy foods eat more vegetables; eat fruit for snacks and desserts; eat smaller portions  Be physically active find an activity I enjoy  Stop smoking -  Prevent falls -  Meeting treatment goals -    No flowsheet data found.   Care Management & Community Referrals:  Referral 10/22/2014  Referrals made for care management support none needed  Referrals made to community resources none

## 2015-01-07 NOTE — Progress Notes (Signed)
Rx for tramadol and generic Synthroid # 90  called in to pharmacy per Dr Sherrine MaplesGlenn. Stanton KidneyDebra Jahsiah Carpenter RN 01/07/15 4:10PM

## 2015-01-07 NOTE — Assessment & Plan Note (Signed)
Symptoms stable. Pt out of Meclizine. - Refilled Meclizine today

## 2015-01-07 NOTE — Assessment & Plan Note (Addendum)
Pt due for pelvic exam and Tdap, which will need to be addressed at her next visit.

## 2015-01-07 NOTE — Progress Notes (Signed)
Patient ID: [name], female   DOB: June 28, 1971, 44 y.o.   MRN: [id number]  Subjective:   Patient ID: Wendy AlbaMelanie B Chase female   DOB: June 28, 1971 44 y.o.   MRN: [id number]  HPI: [name] is a 44 y.o. F w/ PMH bipolar disorder, type I and hypothyroidism presents for a follow up for abnormal lab values.  She states that her blood [facility] and she would like for it to be checked today. She states that [facility] and was given crackers and juice which helped. No CBG was checked.  She also states that her thyroid function needs to be checked. She is taking levothyroxine 100mg  daily and liothyronine 5mg  daily. [facility] Per pt she has a h/o hypothyroidism since 8th grade and w/o h/o radiation or ablation.   She has been doing well [facility]. She denies any fevers, chills, headaches, vision changes, chest pain, palpitations, SOB, abd pain, N/V, changes in bladder or bowel habits, numbness or tingling, or trouble ambulating. She does endorse chronic back pain.   Past Medical History  Diagnosis Date  . Deep venous thrombosis of leg 2012    completed year of coumadin   . Hx of measles   . Migraines   . Monilia infection 12/31/10  . Vaginal atrophy 03/04/11  . Dyspareunia 03/04/11  . Hypothyroidism     Hypothyroidism since 8th grade.  Managed by Dr. Sharl MaKerr, Endocrinology.  On synthroid.  No prior thyroid surgery/ablation.   . Bipolar 1 disorder     Followed by Mental Health.  All psychiatric medications prescribed by Mental Health.  Stable for many years.     Current Outpatient Prescriptions  Medication Sig Dispense Refill  . ARIPiprazole (ABILIFY) 20 MG tablet Take 1 tablet (20 mg total) by mouth at bedtime. For mood control 30 tablet 0  . buPROPion (WELLBUTRIN XL) 150 MG 24 hr tablet Take 1 tablet (150 mg total) by mouth daily. For depression 30 tablet 0  . buPROPion (WELLBUTRIN XL) 300 MG 24 hr  tablet Take (one 300 mg tablet) along with (one 150 mg tablet) daily for depression 30 tablet 0  . clonazePAM (KLONOPIN) 1 MG tablet Take 1 tablet (1 mg total) by mouth 2 (two) times daily. For anxiety 30 tablet 0  . gabapentin (NEURONTIN) 300 MG capsule Take 1 capsule (300 mg total) by mouth 3 (three) times daily. For neuropathic pain 90 capsule 0  . levothyroxine (SYNTHROID, LEVOTHROID) 100 MCG tablet Take 1 tablet (100 mcg total) by mouth daily before breakfast. For hypothyroidism 30 tablet 11  . liothyronine (CYTOMEL) 5 MCG tablet Take 1 tablet (5 mcg total) by mouth daily. For hypothyroidism 90 tablet 1  . lisinopril (PRINIVIL,ZESTRIL) 10 MG tablet Take 1 tablet (10 mg total) by mouth daily. For hypertension 30 tablet 0  . meclizine (ANTIVERT) 25 MG tablet Take 1 tablet (25 mg total) by mouth 2 (two) times daily as needed for dizziness. (Patient not taking: Reported on 12/26/2014) 60 tablet 1  . pantoprazole (PROTONIX) 40 MG tablet Take 1 tablet (40 mg total) by mouth daily. For reflux 30 tablet 11  . propranolol (INDERAL) 80 MG tablet Take 1 tablet (80 mg total) by mouth 3 (three) times daily. For hypertension 90 tablet 1  . rizatriptan (MAXALT) 5 MG tablet Take 1 tablet (5 mg total) by mouth once as needed for migraine. May repeat in  2 hours if needed 30 tablet 2  . simvastatin (ZOCOR) 40 MG tablet Take 1 tablet (40 mg total) by mouth every morning. 30 tablet 11  . traMADol (ULTRAM) 50 MG tablet Take 1 tablet (50 mg total) by mouth every 6 (six) hours as needed (pain). 150 tablet 0  . traZODone (DESYREL) 100 MG tablet Take 1 tablet (100 mg total) by mouth at bedtime. For sleep 30 tablet 5   No current facility-administered medications for this visit.   Family History  Problem Relation Age of Onset  . Cancer Maternal Aunt   . Other Father    History   Social History  . Marital Status: Legally Separated    Spouse Name: N/A  . Number of Children: N/A  . Years of Education: N/A    Social History Main Topics  . Smoking status: Current Every Day Smoker -- 1.00 packs/day    Types: Cigarettes    Last Attempt to Quit: 10/22/2012  . Smokeless tobacco: Never Used     Comment: Pt. is not ready to quit/ no interest in cessation materials/ pt. knows danger of smoking  . Alcohol Use: No     Comment: Pt. admits to 1 glass only on special occasions like BDay etc.  . Drug Use: No  . Sexual Activity: Yes    Birth Control/ Protection: Surgical, Condom     Comment: hysterectomy   Other Topics Concern  . None   Social History Narrative   Review of Systems: A 12 point ROS was performed; pertinent positives and negatives were noted in the HPI   Objective:  Physical Exam: Filed Vitals:   01/07/15 1326  BP: 106/68  Pulse: 59  Temp: 97.5 F (36.4 C)  TempSrc: Oral  Height:  (1.651 m)  Weight: 237 lb 9.6 oz (107.775 kg)  SpO2: 100%   Constitutional: Vital signs reviewed.  Patient is a well-developed and well-nourished female in no acute distress and cooperative with exam. Alert and oriented x3.  Head: Normocephalic and atraumatic Eyes: EOMI. No scleral icterus.  Neck: Normal ROM, obses neck. No thyromegaly or nodules Cardiovascular: RRR, no MRG Pulmonary/Chest: Normal respiratory effort, CTAB, no wheezes, rales, or rhonchi Abdominal: Soft. Non-tender, non-distended, bowel sounds are normal, no guarding present.  Musculoskeletal: No joint deformities, ROM full. Hematology: No cervical adenopathy.  Neurological: A&O x3, cranial nerve II-XII are grossly intact, no focal motor deficit.  Skin: Warm, dry and intact.  Psychiatric: Normal mood and affect. Speech and behavior is normal.   Assessment & Plan:   Please refer to Problem List based Assessment and Plan

## 2015-01-22 ENCOUNTER — Other Ambulatory Visit: Payer: Self-pay | Admitting: Internal Medicine

## 2015-01-23 ENCOUNTER — Telehealth: Payer: Self-pay | Admitting: Internal Medicine

## 2015-01-23 NOTE — Telephone Encounter (Signed)
   Reason for call:   I received a call from [name] at 8:07  PM indicating that she has a persistently dry mouth.   Pertinent Data:   She states that she normally has a dry mouth in the morning, but today her dry mouth has persisted w/o improvement. She states that she noticed a white coating on her tongue that will not go away with brushing her teeth/tongue. She endorse pain of the mouth in addition to the white coating. She states that a friend and family members told her that she has thrush. She denies any previous h/o thrush. She is requesting a prescription be called in to treat thrush.    Assessment / Plan / Recommendations:   She is not currently using any inhaled steroids, and has no h/o DM2, immune suppression, or recent abx use. HIV nonreactive 03/14/14.  She was advised that a prescription would not be called in for her without medical evaluation.   While she may have thrush, I encouraged her to seek medical evaluation for her pain and tongue changes, as she could have thrush, but could be due to other infections or disease.  She was advised to go to an urgent care facility or to to ER for further evaluation.   Wendy GatherKathryn F Laela Deviney, MD   01/23/2015, 8:07 PM

## 2015-01-24 ENCOUNTER — Emergency Department (INDEPENDENT_AMBULATORY_CARE_PROVIDER_SITE_OTHER)
Admission: EM | Admit: 2015-01-24 | Discharge: 2015-01-24 | Disposition: A | Discharge status: Home / Self Care | Payer: Medicaid Other | Source: Home / Self Care | Attending: Family Medicine | Admitting: Family Medicine

## 2015-01-24 ENCOUNTER — Encounter (HOSPITAL_COMMUNITY): Payer: Self-pay | Admitting: Emergency Medicine

## 2015-01-24 DIAGNOSIS — B370 Candidal stomatitis: Secondary | ICD-10-CM

## 2015-01-24 MED ORDER — CLOTRIMAZOLE 10 MG MT TROC: 10.0000 mg | Freq: Every day | OROMUCOSAL | Status: DC

## 2015-01-24 NOTE — ED Provider Notes (Signed)
CSN: [id number]     Arrival date & time 01/24/15  1317 History   First MD Initiated Contact with Patient 01/24/15 1438     Chief Complaint  Patient presents with  . Oral Swelling    oral irritation   (Consider location/radiation/quality/duration/timing/severity/associated sxs/prior Treatment) HPI Comments: Patient presents with a "irritated" rash to the back of her throat and thickening region to her tongue. Noticed last night. Denies recent antibiotic use. Recent normal glucose and work up. No inhalers. No prior history. No fever or chills. No dysphagia.   The history is provided by the patient.    Past Medical History  Diagnosis Date  . Deep venous thrombosis of leg 2012    completed year of coumadin   . Hx of measles   . Migraines   . Monilia infection 12/31/10  . Vaginal atrophy 03/04/11  . Dyspareunia 03/04/11  . Hypothyroidism     Hypothyroidism since 8th grade.  Managed by Dr. Sharl Ma, Endocrinology.  On synthroid.  No prior thyroid surgery/ablation.   . Bipolar 1 disorder     Followed by Mental Health.  All psychiatric medications prescribed by Mental Health.  Stable for many years.     Past Surgical History  Procedure Laterality Date  . 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 Cesarean section  1998  . Tonsillectomy    . Wisdom tooth extraction    . Cholecystectomy    . Left elbow    . Abdominal hysterectomy  2000  . Appendectomy     Family History  Problem Relation Age of Onset  . Cancer Maternal Aunt   . Other Father    History  Substance Use Topics  . Smoking status: Current Every Day Smoker -- 1.00 packs/day    Types: Cigarettes    Last Attempt to Quit: 10/22/2012  . Smokeless tobacco: Never Used     Comment: Pt. is not ready to quit/ no interest in cessation materials/ pt. knows danger of smoking  . Alcohol Use: No     Comment: Pt. admits to 1 glass only on special occasions like BDay etc.   OB History    Gravida Para Term Preterm AB TAB SAB Ectopic  Multiple Living   Review of Systems  All other systems reviewed and are negative.   Allergies  Codeine; Meloxicam; Morphine and related; Naproxen; and Sulfonamide derivatives  Home Medications   Prior to Admission medications   Medication Sig Start Date End Date Taking? Authorizing Provider  ARIPiprazole (ABILIFY) 20 MG tablet Take 1 tablet (20 mg total) by mouth at bedtime. For mood control 12/31/14  Yes Shuvon B Rankin, NP  buPROPion (WELLBUTRIN XL) 150 MG 24 hr tablet Take 1 tablet (150 mg total) by mouth daily. For depression 12/31/14  Yes Shuvon B Rankin, NP  buPROPion (WELLBUTRIN XL) 300 MG 24 hr tablet Take (one 300 mg tablet) along with (one 150 mg tablet) daily for depression 12/31/14  Yes Shuvon B Rankin, NP  clonazePAM (KLONOPIN) 1 MG tablet Take 1 tablet (1 mg total) by mouth 2 (two) times daily. For anxiety 12/31/14  Yes Shuvon B Rankin, NP  gabapentin (NEURONTIN) 300 MG capsule Take 1 capsule (300 mg total) by mouth 3 (three) times daily. For neuropathic pain 12/31/14  Yes Shuvon B Rankin, NP  levothyroxine (SYNTHROID, LEVOTHROID) 125 MCG tablet Take 1 tablet (125 mcg total) by mouth daily before  breakfast. For hypothyroidism 01/07/15  Yes Genelle GatherKathryn F Glenn, MD  liothyronine (CYTOMEL) 5 MCG tablet Take 1 tablet (5 mcg total) by mouth daily. For hypothyroidism 12/31/14  Yes Shuvon B Rankin, NP  lisinopril (PRINIVIL,ZESTRIL) 10 MG tablet Take 1 tablet (10 mg total) by mouth daily. For hypertension 12/31/14  Yes Shuvon B Rankin, NP  meclizine (ANTIVERT) 25 MG tablet Take 1 tablet (25 mg total) by mouth 2 (two) times daily as needed for dizziness. 01/07/15  Yes Genelle GatherKathryn F Glenn, MD  pantoprazole (PROTONIX) 40 MG tablet Take 1 tablet (40 mg total) by mouth daily. For reflux 12/31/14  Yes Shuvon B Rankin, NP  propranolol (INDERAL) 80 MG tablet Take 1 tablet (80 mg total) by mouth 3 (three) times daily. For hypertension 12/31/14  Yes Shuvon B Rankin, NP  propranolol (INDERAL) 80 MG tablet  TAKE 1 TABLET BY MOUTH THREE TIMES DAILY 01/24/15  Yes Denton Brickiana M Truong, MD  rizatriptan (MAXALT) 5 MG tablet Take 1 tablet (5 mg total) by mouth once as needed for migraine. May repeat in 2 hours if needed 12/03/14 12/03/15 Yes Carolan ClinesNora M Sadek, MD  simvastatin (ZOCOR) 40 MG tablet Take 1 tablet (40 mg total) by mouth every morning. 12/31/14  Yes Shuvon B Rankin, NP  clotrimazole (MYCELEX) 10 MG troche Take 1 tablet (10 mg total) by mouth 5 (five) times daily. 01/24/15   Riki SheerMichelle G Young, PA-C  traMADol (ULTRAM) 50 MG tablet Take 1 tablet (50 mg total) by mouth every 6 (six) hours as needed (pain). 01/07/15   Genelle GatherKathryn F Glenn, MD  traZODone (DESYREL) 100 MG tablet Take 1 tablet (100 mg total) by mouth at bedtime. For sleep 12/31/14   Shuvon B Rankin, NP   BP 99/62 mmHg Physical Exam  Constitutional: She is oriented to person, place, and time. She appears well-developed and well-nourished. No distress.  HENT:  Head: Normocephalic and atraumatic.  White plaques on the tongue and buccal mucosa consistent with yeast  Neck: Normal range of motion.  Pulmonary/Chest: Effort normal and breath sounds normal.  Neurological: She is alert and oriented to person, place, and time.  Skin: Skin is warm and dry. She is not diaphoretic.  Psychiatric: Her behavior is normal.  Nursing note and vitals reviewed.   ED Course  Procedures (including critical care time) Labs Review Labs Reviewed - No data to display  Imaging Review No results found.   MDM   1. Thrush    No underlying etiology noted today. Will treat with troche x 5 days and follow. She should f/u with her PCP if does not improved.     Riki SheerMichelle G Young, PA-C 01/24/15 1500

## 2015-01-24 NOTE — ED Notes (Signed)
Reports having a white film covering tonuge.  Mild irritation and soreness.  Denies recent antibiotic use.  No fever, n/v/d.   No otc treatments tried.

## 2015-01-24 NOTE — Discharge Instructions (Signed)
Thrush, Adult  Thrush is an infection that can happen on the mouth, throat, tongue, or other areas. It causes white patches to form on the mouth and tongue. HOME CARE  Only take medicine as told by your doctor. You may be given medicine to swallow or to apply right on the area.  Eat plain yogurt that contains live cultures (check the label).  Rinse your mouth many times a day with a warm saltwater rinse. To make the rinse, mix 1 teaspoon (6 g) of salt in 8 ounces (0.2 L) of warm water. To reduce pain:  Drink cold liquids such as water or iced tea.  Eat frozen ice pops or frozen juices.  Eat foods that are easy to swallow, such as gelatin or ice cream.  Drink from a straw if the patches are painful. If you are breastfeeding:  Clean your nipples with an antifungal medicine.  Dry your nipples after breastfeeding.  Use an ointment called lanolin to help relieve nipple soreness. If you wear dentures:  Take out your dentures before going to bed.  Brush them thoroughly.  Soak them in a denture cleaner. GET HELP IF:   Your problems are getting worse.  Your problems are not improving within 7 days of starting treatment.  Your infection is spreading. This may show as white patches on the skin outside of your mouth.  You are nursing and have redness and pain in the nipples. MAKE SURE YOU:  Understand these instructions.  Will watch your condition.  Will get help right away if you are not doing well or get worse. Document Released: 01/11/2010 Document Revised: 08/07/2013 Document Reviewed: 05/20/2013 [employer] Patient Information 2015 [employer]. This information is not intended to replace advice given to you by your health care provider. Make sure you discuss any questions you have with your health care provider.  

## 2015-01-28 ENCOUNTER — Other Ambulatory Visit: Payer: Self-pay | Admitting: *Deleted

## 2015-01-28 ENCOUNTER — Telehealth: Payer: Self-pay | Admitting: *Deleted

## 2015-01-28 DIAGNOSIS — B370 Candidal stomatitis: Secondary | ICD-10-CM

## 2015-01-28 MED ORDER — NYSTATIN 100000 UNIT/ML MT SUSP: 5.0000 mL | Freq: Four times a day (QID) | OROMUCOSAL | Status: DC

## 2015-01-28 NOTE — Telephone Encounter (Signed)
I have prescribed nystatin solution 5 ml (500,000 units) by mouth as a swish and swallow for 7 days and sent it to her pharmacy.  Please call the patient and inform her of this and the need to discontinue the clotrimazole.  If there is no improvement after 1 week she will need to call and make an appointment for reassessment.  Thank you.

## 2015-01-28 NOTE — Telephone Encounter (Signed)
Pt calls and states she cannot take the med prescribed for her in the ED to treat her thrush, it causes her gi discomfort, please advise

## 2015-01-28 NOTE — Telephone Encounter (Signed)
Called pt, explained new med, importance of SWISHING and swallowing and for f/u if no improvement, she is agreeable and has stopped the clotrimazole

## 2015-01-29 ENCOUNTER — Emergency Department (HOSPITAL_COMMUNITY)
Admission: EM | Admit: 2015-01-29 | Discharge: 2015-01-29 | Disposition: A | Discharge status: Home / Self Care | Payer: Medicaid Other | Attending: Emergency Medicine | Admitting: Emergency Medicine

## 2015-01-29 ENCOUNTER — Encounter (HOSPITAL_COMMUNITY): Payer: Self-pay | Admitting: *Deleted

## 2015-01-29 DIAGNOSIS — R197 Diarrhea, unspecified: Secondary | ICD-10-CM | POA: Diagnosis not present

## 2015-01-29 DIAGNOSIS — T367X5A Adverse effect of antifungal antibiotics, systemically used, initial encounter: Secondary | ICD-10-CM | POA: Diagnosis not present

## 2015-01-29 DIAGNOSIS — R21 Rash and other nonspecific skin eruption: Secondary | ICD-10-CM | POA: Diagnosis not present

## 2015-01-29 DIAGNOSIS — Z720 Tobacco use: Secondary | ICD-10-CM | POA: Diagnosis not present

## 2015-01-29 DIAGNOSIS — Z86718 Personal history of other venous thrombosis and embolism: Secondary | ICD-10-CM | POA: Diagnosis not present

## 2015-01-29 DIAGNOSIS — F319 Bipolar disorder, unspecified: Secondary | ICD-10-CM | POA: Insufficient documentation

## 2015-01-29 DIAGNOSIS — E039 Hypothyroidism, unspecified: Secondary | ICD-10-CM | POA: Insufficient documentation

## 2015-01-29 DIAGNOSIS — R110 Nausea: Secondary | ICD-10-CM | POA: Insufficient documentation

## 2015-01-29 DIAGNOSIS — Z79899 Other long term (current) drug therapy: Secondary | ICD-10-CM | POA: Diagnosis not present

## 2015-01-29 DIAGNOSIS — R220 Localized swelling, mass and lump, head: Secondary | ICD-10-CM | POA: Diagnosis not present

## 2015-01-29 DIAGNOSIS — Z8742 Personal history of other diseases of the female genital tract: Secondary | ICD-10-CM | POA: Insufficient documentation

## 2015-01-29 DIAGNOSIS — T7840XA Allergy, unspecified, initial encounter: Secondary | ICD-10-CM

## 2015-01-29 DIAGNOSIS — Z8619 Personal history of other infectious and parasitic diseases: Secondary | ICD-10-CM | POA: Insufficient documentation

## 2015-01-29 DIAGNOSIS — R0602 Shortness of breath: Secondary | ICD-10-CM | POA: Insufficient documentation

## 2015-01-29 DIAGNOSIS — G43909 Migraine, unspecified, not intractable, without status migrainosus: Secondary | ICD-10-CM | POA: Diagnosis not present

## 2015-01-29 MED ORDER — PANTOPRAZOLE SODIUM 40 MG PO TBEC: 40.0000 mg | DELAYED_RELEASE_TABLET | Freq: Every day | ORAL | Status: DC

## 2015-01-29 MED ORDER — PREDNISONE 20 MG PO TABS
60.0000 mg | ORAL_TABLET | Freq: Once | ORAL | Status: AC
  Administered 2015-01-29: 60 mg via ORAL
  Filled 2015-01-29: qty 3

## 2015-01-29 MED ORDER — PREDNISONE 20 MG PO TABS: ORAL_TABLET | ORAL | Status: DC

## 2015-01-29 MED ORDER — PANTOPRAZOLE SODIUM 40 MG PO TBEC
80.0000 mg | DELAYED_RELEASE_TABLET | Freq: Every day | ORAL | Status: DC
  Administered 2015-01-29: 80 mg via ORAL
  Filled 2015-01-29: qty 2

## 2015-01-29 MED ORDER — RANITIDINE HCL 150 MG PO CAPS: 150.0000 mg | ORAL_CAPSULE | Freq: Two times a day (BID) | ORAL | Status: DC

## 2015-01-29 MED ORDER — DIPHENHYDRAMINE HCL 25 MG PO CAPS
25.0000 mg | ORAL_CAPSULE | Freq: Once | ORAL | Status: AC
  Administered 2015-01-29: 25 mg via ORAL
  Filled 2015-01-29: qty 1

## 2015-01-29 MED ORDER — DIPHENHYDRAMINE HCL 25 MG PO TABS: 25.0000 mg | ORAL_TABLET | Freq: Four times a day (QID) | ORAL | Status: DC

## 2015-01-29 MED ORDER — LISINOPRIL 10 MG PO TABS: 10.0000 mg | ORAL_TABLET | Freq: Every day | ORAL | Status: DC

## 2015-01-29 NOTE — ED Notes (Signed)
Dr. Docherty at bedside.

## 2015-01-29 NOTE — ED Notes (Signed)
Pt. Left with all belongings and refused wheelchair 

## 2015-01-29 NOTE — ED Notes (Signed)
Pt c/o SOB and Headache. States that she was prescribed nyastin on Saturday d/t thrush. States that she believes she is having an allergic reaction to it.

## 2015-01-29 NOTE — ED Provider Notes (Signed)
CSN: [id number]     Arrival date & time 01/29/15  0121 History  This chart was scribed for Toy CookeyMegan Docherty, MD by Annye AsaAnna Dorsett, ED Scribe. This patient was seen in room B15C/B15C and the patient's care was started at 2:12 AM.    Chief Complaint  Patient presents with  . Allergic Reaction   Patient is a 44 y.o. female presenting with allergic reaction. The history is provided by the patient. No language interpreter was used.  Allergic Reaction Presenting symptoms: rash      HPI Comments: Wendy AlbaMelanie B Chase is a 44 y.o. female who presents to the Emergency Department complaining of allergic reaction. Patient reports that she was started on nystatin for thrush by Urgent Care on 01/24/15; she has been taking the medication 5x per day as prescribed and began the mouthwash today (scheduled for 2 pills, three swish and swallows). She reports that she has had difficulty "catching her breath, like a panic attack" since 30-45 minutes after her second swish and swallow today (around 12:30). She also reports skin itching, rash under her right breast, and lip swelling that has resolved with time. Her breathing is gradually improved since symptom onset. She also reports headache, nausea, and diarrhea. She denies vomiting.   She believes she has taken Nystatin once before, many years ago. She has never had formal allergy testing.   Past Medical History  Diagnosis Date  . Deep venous thrombosis of leg 2012    completed year of coumadin   . Hx of measles   . Migraines   . Monilia infection 12/31/10  . Vaginal atrophy 03/04/11  . Dyspareunia 03/04/11  . Hypothyroidism     Hypothyroidism since 8th grade.  Managed by Dr. Sharl MaKerr, Endocrinology.  On synthroid.  No prior thyroid surgery/ablation.   . Bipolar 1 disorder     Followed by Mental Health.  All psychiatric medications prescribed by Mental Health.  Stable for many years.     Past Surgical History  Procedure Laterality Date  . Replacement total knee  2001     right knee  . Cesarean section  1998  . Tonsillectomy    . Wisdom tooth extraction    . Cholecystectomy    . Left elbow    . Abdominal hysterectomy  2000  . Appendectomy     Family History  Problem Relation Age of Onset  . Cancer Maternal Aunt   . Other Father    History  Substance Use Topics  . Smoking status: Current Every Day Smoker -- 1.00 packs/day    Types: Cigarettes    Last Attempt to Quit: 10/22/2012  . Smokeless tobacco: Never Used     Comment: Pt. is not ready to quit/ no interest in cessation materials/ pt. knows danger of smoking  . Alcohol Use: No     Comment: Pt. admits to 1 glass only on special occasions like BDay etc.   OB History    Gravida Para Term Preterm AB TAB SAB Ectopic Multiple Living   1 1 1       1      Review of Systems  Constitutional: Negative for fever, chills, diaphoresis, activity change, appetite change and fatigue.  HENT: Positive for facial swelling. Negative for congestion, rhinorrhea and sore throat.   Eyes: Negative for photophobia and discharge.  Respiratory: Positive for shortness of breath. Negative for cough and chest tightness.   Cardiovascular: Negative for chest pain, palpitations and leg swelling.  Gastrointestinal: Positive for nausea and diarrhea. Negative  for vomiting and abdominal pain.  Endocrine: Negative for polydipsia and polyuria.  Genitourinary: Negative for dysuria, frequency, difficulty urinating and pelvic pain.  Musculoskeletal: Negative for back pain, arthralgias, neck pain and neck stiffness.  Skin: Positive for rash. Negative for color change and wound.  Allergic/Immunologic: Negative for immunocompromised state.  Neurological: Positive for headaches. Negative for facial asymmetry, weakness and numbness.  Hematological: Does not bruise/bleed easily.  Psychiatric/Behavioral: Negative for confusion and agitation.      Allergies  Codeine; Meloxicam; Morphine and related; Naproxen; and Sulfonamide  derivatives  Home Medications   Prior to Admission medications   Medication Sig Start Date End Date Taking? Authorizing Provider  ARIPiprazole (ABILIFY) 20 MG tablet Take 1 tablet (20 mg total) by mouth at bedtime. For mood control 12/31/14   Shuvon B Rankin, NP  buPROPion (WELLBUTRIN XL) 150 MG 24 hr tablet Take 1 tablet (150 mg total) by mouth daily. For depression 12/31/14   Shuvon B Rankin, NP  buPROPion (WELLBUTRIN XL) 300 MG 24 hr tablet Take (one 300 mg tablet) along with (one 150 mg tablet) daily for depression 12/31/14   Shuvon B Rankin, NP  clonazePAM (KLONOPIN) 1 MG tablet Take 1 tablet (1 mg total) by mouth 2 (two) times daily. For anxiety 12/31/14   Shuvon B Rankin, NP  diphenhydrAMINE (BENADRYL) 25 MG tablet Take 1 tablet (25 mg total) by mouth every 6 (six) hours. 01/29/15   Toy Cookey, MD  gabapentin (NEURONTIN) 300 MG capsule Take 1 capsule (300 mg total) by mouth 3 (three) times daily. For neuropathic pain 12/31/14   Shuvon B Rankin, NP  levothyroxine (SYNTHROID, LEVOTHROID) 125 MCG tablet Take 1 tablet (125 mcg total) by mouth daily before breakfast. For hypothyroidism 01/07/15   Genelle Gather, MD  liothyronine (CYTOMEL) 5 MCG tablet Take 1 tablet (5 mcg total) by mouth daily. For hypothyroidism 12/31/14   Shuvon B Rankin, NP  lisinopril (PRINIVIL,ZESTRIL) 10 MG tablet Take 1 tablet (10 mg total) by mouth daily. For hypertension 01/29/15   Denton Brick, MD  meclizine (ANTIVERT) 25 MG tablet Take 1 tablet (25 mg total) by mouth 2 (two) times daily as needed for dizziness. 01/07/15   Genelle Gather, MD  pantoprazole (PROTONIX) 40 MG tablet Take 1 tablet (40 mg total) by mouth daily. For reflux 01/29/15   Denton Brick, MD  predniSONE (DELTASONE) 20 MG tablet 3 tabs po day one, then 2 po daily x 3 days 01/29/15   Toy Cookey, MD  propranolol (INDERAL) 80 MG tablet Take 1 tablet (80 mg total) by mouth 3 (three) times daily. For hypertension 12/31/14   Shuvon B Rankin, NP  propranolol  (INDERAL) 80 MG tablet TAKE 1 TABLET BY MOUTH THREE TIMES DAILY 01/24/15   Denton Brick, MD  ranitidine (ZANTAC) 150 MG capsule Take 1 capsule (150 mg total) by mouth 2 (two) times daily. 01/29/15   Toy Cookey, MD  rizatriptan (MAXALT) 5 MG tablet Take 1 tablet (5 mg total) by mouth once as needed for migraine. May repeat in 2 hours if needed 12/03/14 12/03/15  Carolan Clines, MD  simvastatin (ZOCOR) 40 MG tablet Take 1 tablet (40 mg total) by mouth every morning. 12/31/14   Shuvon B Rankin, NP  traMADol (ULTRAM) 50 MG tablet Take 1 tablet (50 mg total) by mouth every 6 (six) hours as needed (pain). 01/07/15   Genelle Gather, MD  traZODone (DESYREL) 100 MG tablet Take 1 tablet (100 mg total) by mouth at  bedtime. For sleep 12/31/14   Shuvon B Rankin, NP   BP 100/72 mmHg  Pulse 69  Temp(Src) 98.4 F (36.9 C) (Oral)  Resp 19  Ht  (1.651 m)  Wt 237 lb (107.502 kg)  BMI 39.44 kg/m2  SpO2 93% Physical Exam  Constitutional: She is oriented to person, place, and time. She appears well-developed and well-nourished. No distress.  HENT:  Head: Normocephalic.  Mouth/Throat: Oropharynx is clear and moist.  Eyes: Pupils are equal, round, and reactive to light.  Neck: Neck supple.  Cardiovascular: Normal rate, regular rhythm and normal heart sounds.   Pulmonary/Chest: Effort normal and breath sounds normal. No respiratory distress. She has no wheezes.  Abdominal: Soft. She exhibits no distension. There is no tenderness. There is no rebound and no guarding.  Musculoskeletal: She exhibits no edema or tenderness.  Neurological: She is alert and oriented to person, place, and time.  Skin: Skin is warm and dry. Rash (Under right breast: clearly demarkated, raised, erythematous patch in her breast fold ) noted.  Psychiatric: She has a normal mood and affect.  Nursing note and vitals reviewed.   ED Course  Procedures   DIAGNOSTIC STUDIES: Oxygen Saturation is 96% on RA, adequate by my interpretation.     COORDINATION OF CARE: 2:20 AM Discussed treatment plan with pt at bedside and pt agreed to plan.   Labs Review Labs Reviewed - No data to display  Imaging Review No results found.   EKG Interpretation None      MDM   Final diagnoses:  Allergic reaction caused by a drug    Pt is a 44 y.o. female with Pmhx as above who presents with report of SOB, h/a pruritis after taking nystatin several days for thrush which on PE in resolved. She has candidal rash under R breast, otherwise no skin changes. She reports SOB, thought RR, HR, O2 sats are nml and lungs clear. No sings of intraoral swelling. Clinically, symptoms not c/w anaphylaxis though will rec d/c nystatin. Will d/ chome w/ 3 days benadryl, zantac, pred.      [name] evaluation in the Emergency Department is complete. It has been determined that no acute conditions requiring further emergency intervention are present at this time. The patient/guardian have been advised of the diagnosis and plan. We have discussed signs and symptoms that warrant return to the ED, such as changes or worsening in symptoms, worsening rash, SOB.   I personally performed the services described in this documentation, which was scribed in my presence. The recorded information has been reviewed and is accurate.    Feels much better, no SOB, will d/c hoem with 3 days zantac, benadryl, pred.   Toy Cookey, MD 01/29/15 2059

## 2015-01-29 NOTE — Discharge Instructions (Signed)

## 2015-01-29 NOTE — ED Notes (Signed)
PT states that she started a new medications on Saturday and today and has started to feel worse since starting the new medications today. Pt states that she feels like she can't catch her breath. No respiratory distress noted.

## 2015-02-11 ENCOUNTER — Other Ambulatory Visit: Payer: Self-pay | Admitting: *Deleted

## 2015-02-11 MED ORDER — GABAPENTIN 300 MG PO CAPS: 300.0000 mg | ORAL_CAPSULE | Freq: Three times a day (TID) | ORAL | Status: DC

## 2015-02-12 ENCOUNTER — Telehealth: Payer: Self-pay | Admitting: Internal Medicine

## 2015-02-12 NOTE — Telephone Encounter (Signed)
   Reason for call:   I received a call from [name] at 4:50PM and again at 5:40PM.  She states that she needs her gabapentin refilled and no one has returned her call.  Upon checking EPIC her gabapentin was refilled on 02/11/15.      Pertinent Data:   N/A   Assessment / Plan / Recommendations:   Instructed patient she should pick up gabapentin at her pharmacy.    As always, pt is advised that if symptoms worsen or new symptoms arise, they should go to an urgent care facility or to to ER for further evaluation.   Wendy SalvageJacquelyn S Terea Neubauer, MD   02/12/2015, 6:15 PM

## 2015-02-17 ENCOUNTER — Other Ambulatory Visit: Payer: Self-pay | Admitting: Internal Medicine

## 2015-02-17 ENCOUNTER — Other Ambulatory Visit: Payer: Self-pay | Admitting: *Deleted

## 2015-02-17 DIAGNOSIS — M545 Low back pain, unspecified: Secondary | ICD-10-CM

## 2015-02-17 DIAGNOSIS — G8929 Other chronic pain: Secondary | ICD-10-CM

## 2015-02-17 MED ORDER — TRAMADOL HCL 50 MG PO TABS: 50.0000 mg | ORAL_TABLET | Freq: Four times a day (QID) | ORAL | Status: DC | PRN

## 2015-02-17 NOTE — Telephone Encounter (Signed)
Pt would like some flexeril also

## 2015-02-18 NOTE — Telephone Encounter (Signed)
Called to pharm 

## 2015-03-02 ENCOUNTER — Ambulatory Visit (INDEPENDENT_AMBULATORY_CARE_PROVIDER_SITE_OTHER): Payer: Medicaid Other | Admitting: Internal Medicine

## 2015-03-02 ENCOUNTER — Other Ambulatory Visit: Payer: Self-pay | Admitting: Internal Medicine

## 2015-03-02 ENCOUNTER — Encounter: Payer: Self-pay | Admitting: Internal Medicine

## 2015-03-02 VITALS — BP 114/77 | HR 61 | Temp 98.2°F | Ht 65.0 in | Wt 226.9 lb

## 2015-03-02 DIAGNOSIS — M545 Low back pain: Secondary | ICD-10-CM | POA: Diagnosis not present

## 2015-03-02 DIAGNOSIS — E039 Hypothyroidism, unspecified: Secondary | ICD-10-CM | POA: Diagnosis not present

## 2015-03-02 DIAGNOSIS — G8929 Other chronic pain: Secondary | ICD-10-CM

## 2015-03-02 DIAGNOSIS — E031 Congenital hypothyroidism without goiter: Secondary | ICD-10-CM | POA: Insufficient documentation

## 2015-03-02 DIAGNOSIS — Z23 Encounter for immunization: Secondary | ICD-10-CM

## 2015-03-02 MED ORDER — LEVOTHYROXINE SODIUM 125 MCG PO TABS: 125.0000 ug | ORAL_TABLET | Freq: Every day | ORAL | Status: DC

## 2015-03-02 MED ORDER — TRAMADOL HCL 50 MG PO TABS: 100.0000 mg | ORAL_TABLET | Freq: Three times a day (TID) | ORAL | Status: DC | PRN

## 2015-03-02 NOTE — Progress Notes (Signed)
INTERNAL MEDICINE TEACHING ATTENDING ADDENDUM - Gurvir Schrom, MD: I reviewed and discussed at the time of visit with the resident Dr. Truong, the patient's medical history, physical examination, diagnosis and results of pertinent tests and treatment and I agree with the patient's care as documented.  

## 2015-03-02 NOTE — Assessment & Plan Note (Signed)
Pt has chronic lower back pain s/p vertebroplasty 09/2013. Pt has seen Dr. Cleophas DunkerWhitfield with ortho and has received spinal injections which have helped with pain. Denies radiation of back pain. Has been on tramadol 50mg  TID for pain which she states is not helping with pain.   - given rx for tramadol 100mg  TID PRN for pain and instructed to f/u with Dr. Cleophas DunkerWhitfield. Pt states she will make an appt to see him ASAP, and that he always has open spots available. Discussed with patient that I will be very hesitant to prescribe her pain meds if she does not f/u with ortho.

## 2015-03-02 NOTE — Assessment & Plan Note (Addendum)
Last TSH in 2/16 elevated at 13.094, synthroid was then increased to 125mcg from 100 mcg during last visit. She is also on cytomel 5mcg. Endorses fatigue.   - checking TSH today, will call pt and adjust meds as needed. Refilled synthroid.   UPDATE 5/4-- TSH low at 0.085. Called patient today at 1:18 pm and instructed her to take half of her synthroid 125mcg tablet and to f/u in 4 weeks. Will send front desk message to schedule her an appt.

## 2015-03-02 NOTE — Progress Notes (Signed)
   Subjective:    Patient ID: Liston AlbaMelanie B Delgreco, female    DOB: 1971-05-23, 44 y.o.   MRN: [id number]  HPI Pt is a 44 y/o F w/ PMHx of migraines, hypothyroidism, bipolar disorder, chronic back pain s/p vertebroplasty in 2014  and tobacco abuse who presents to clinic for hypothyroidism f/u and chronic back pain.   Health maintenance: follows w/ Dr. Pennie RushingHaygood with [facility] [facility], last pap smear last year which was negative including HPV. Will need to obtain records.    Please see problem list for further details.     Review of Systems  Constitutional: Positive for fatigue. Negative for appetite change and unexpected weight change.  Respiratory: Negative for shortness of breath.   Cardiovascular: Negative for chest pain.  Genitourinary: Negative for dysuria and vaginal discharge.  Skin: Negative.   Psychiatric/Behavioral: Negative for dysphoric mood.       Objective:   Physical Exam  Constitutional: She appears well-developed and well-nourished. No distress.  HENT:  Head: Normocephalic.  Cardiovascular: Normal rate and regular rhythm.   Pulmonary/Chest: Effort normal and breath sounds normal.  Abdominal: Soft. Bowel sounds are normal.  Musculoskeletal: She exhibits tenderness (to palpation of lower spine).  Psychiatric: She has a normal mood and affect.          Assessment & Plan:  Please see problem based assessment and plan.

## 2015-03-02 NOTE — Patient Instructions (Signed)
You can take tramadol 100mg  three times a day as needed for lower back pain. You will need to schedule an appointment with your orthopedist as soon as possible for your back pain.

## 2015-03-03 LAB — TSH: TSH: 0.085 u[IU]/mL — ABNORMAL LOW (ref 0.350–4.500)

## 2015-03-06 ENCOUNTER — Telehealth: Payer: Self-pay | Admitting: *Deleted

## 2015-03-06 NOTE — Telephone Encounter (Signed)
Called pt at 4:00pm today. She states tramadol 100mg  is upsetting her stomach. Instructed to cut back to 50mg  TID. Pt is angry and wants vicodin. During visit on Monday agreement was that she would get tramadol only to hold her over until she can see Dr. Cleophas DunkerWhitfield. She has not made an appt with him since our visit, states she is having transportation difficulties and does not want to pay her $7 co-pay.  Informed pt she will need to make an appt with Dr. Cleophas DunkerWhitfield before further pain meds are even an consideration.   Gara Kronerruong, Janeil Schexnayder, MD Internal Medicine Resident, PGY I [facility] [facility] Pager: [phone]

## 2015-03-06 NOTE — Telephone Encounter (Signed)
Pt called stating she has been taking tramadol and it is "tearing her stomach up" She needs vicodin for her back pain. Last seen 5/2. Back pain and pain meds discussed.   Please advise. Pt # [phone]

## 2015-03-23 ENCOUNTER — Other Ambulatory Visit: Payer: Self-pay | Admitting: Internal Medicine

## 2015-04-01 ENCOUNTER — Other Ambulatory Visit: Payer: Self-pay

## 2015-04-01 ENCOUNTER — Telehealth: Payer: Self-pay | Admitting: Internal Medicine

## 2015-04-01 ENCOUNTER — Other Ambulatory Visit: Payer: Self-pay | Admitting: Internal Medicine

## 2015-04-01 DIAGNOSIS — E038 Other specified hypothyroidism: Secondary | ICD-10-CM

## 2015-04-01 NOTE — Telephone Encounter (Signed)
Call to patient to confirm appointment for 04/02/15 at 3:30 lmtcb

## 2015-04-02 ENCOUNTER — Other Ambulatory Visit: Payer: Self-pay

## 2015-04-03 ENCOUNTER — Telehealth: Payer: Self-pay | Admitting: *Deleted

## 2015-04-03 ENCOUNTER — Other Ambulatory Visit: Payer: Self-pay | Admitting: Internal Medicine

## 2015-04-03 NOTE — Telephone Encounter (Signed)
Pt called - states she has UTI - having left back pain. Has no transportation and needs 3 day notice to get transportation. Need to check urine. Tried to offer an appt this PM in clinic. Discuss going to Urgent Care when someone could take her. Stanton KidneyDebra Yanil Dawe RN 04/03/15 11:10AM

## 2015-04-23 ENCOUNTER — Telehealth: Payer: Self-pay | Admitting: Internal Medicine

## 2015-04-23 ENCOUNTER — Other Ambulatory Visit: Payer: Self-pay | Admitting: Internal Medicine

## 2015-04-23 NOTE — Telephone Encounter (Signed)
error 

## 2015-04-24 ENCOUNTER — Ambulatory Visit: Payer: Self-pay | Admitting: Internal Medicine

## 2015-04-25 ENCOUNTER — Other Ambulatory Visit: Payer: Self-pay | Admitting: Internal Medicine

## 2015-04-29 ENCOUNTER — Telehealth: Payer: Self-pay | Admitting: Internal Medicine

## 2015-04-29 NOTE — Telephone Encounter (Signed)
Called pt today at both home and mobile number at 8:20pm, she did not pick up. Her tramadol rx was denied b/c during last phone conversation with pt on 03/06/15 pt stated that tramadol 100mg  was "tearing her stomach" she was insisting on getting vicodin for back pain. I told pt to cut back tramadol to 50mg  TID which she refused. She also refused other measures for pain control including warm compresses. She was informed during her visit on 5/2 which she received the rx for tramadol 100mg  TID that it was only to hold her over until she was able to see ortho for back pain. She has yet to see him. During conversation she called this Clinical research associatewriter unfair and proceeded to call me  "a sorry fucking doctor" and hung up. I will not refill tramadol as she states it causes her stomach pain. Will need to f/u with Dr. Cleophas DunkerWhitfield w/ ortho or see me in clinic for further back pain evaluation if she even wishes for me to continue to be her PCP.   Gara Kronerruong, Ethelyn Cerniglia, MD Internal Medicine Resident, PGY I [facility] Pager: [phone]

## 2015-04-29 NOTE — Telephone Encounter (Signed)
Pt calling about a refill she requested on 04/23/15.

## 2015-04-29 NOTE — Telephone Encounter (Signed)
Pt informed Tramadol refill was denied (6/23). Pt wants to know why. States she has an appt w/Dr Danella Pentonruong next week; unable to schedule an appt w/Dr Cleophas DunkerWhitfield until after the 4th. States she has been out of Tramadol for 2 weeks and her back "is killing me". Thanks

## 2015-04-30 NOTE — Telephone Encounter (Signed)
Pt calling back

## 2015-04-30 NOTE — Telephone Encounter (Signed)
Pt called no answer message left to call the clinic.

## 2015-05-01 NOTE — Telephone Encounter (Signed)
Tried to call pt again today.  No answer and unable to leave message.

## 2015-05-05 ENCOUNTER — Telehealth: Payer: Self-pay | Admitting: Internal Medicine

## 2015-05-05 NOTE — Telephone Encounter (Signed)
Pt called again today.  Message left that I tried to return her call. She does have a scheduled appointment tomorrow in clinic

## 2015-05-05 NOTE — Telephone Encounter (Signed)
Call to patient to confirm appointment for 05/06/15 at 2:45 lmtcb ° °

## 2015-05-06 ENCOUNTER — Encounter: Payer: Self-pay | Admitting: Internal Medicine

## 2015-05-12 IMAGING — CR DG THORACIC SPINE 3V
3 series · 3 of 3 positions shown · non-contrast
Comparison: None.

CLINICAL DATA: Chronic mid back pain without history of injury.
Initial encounter.

EXAM:
THORACIC SPINE - 2 VIEW + SWIMMERS

[t thoracic spine ap]
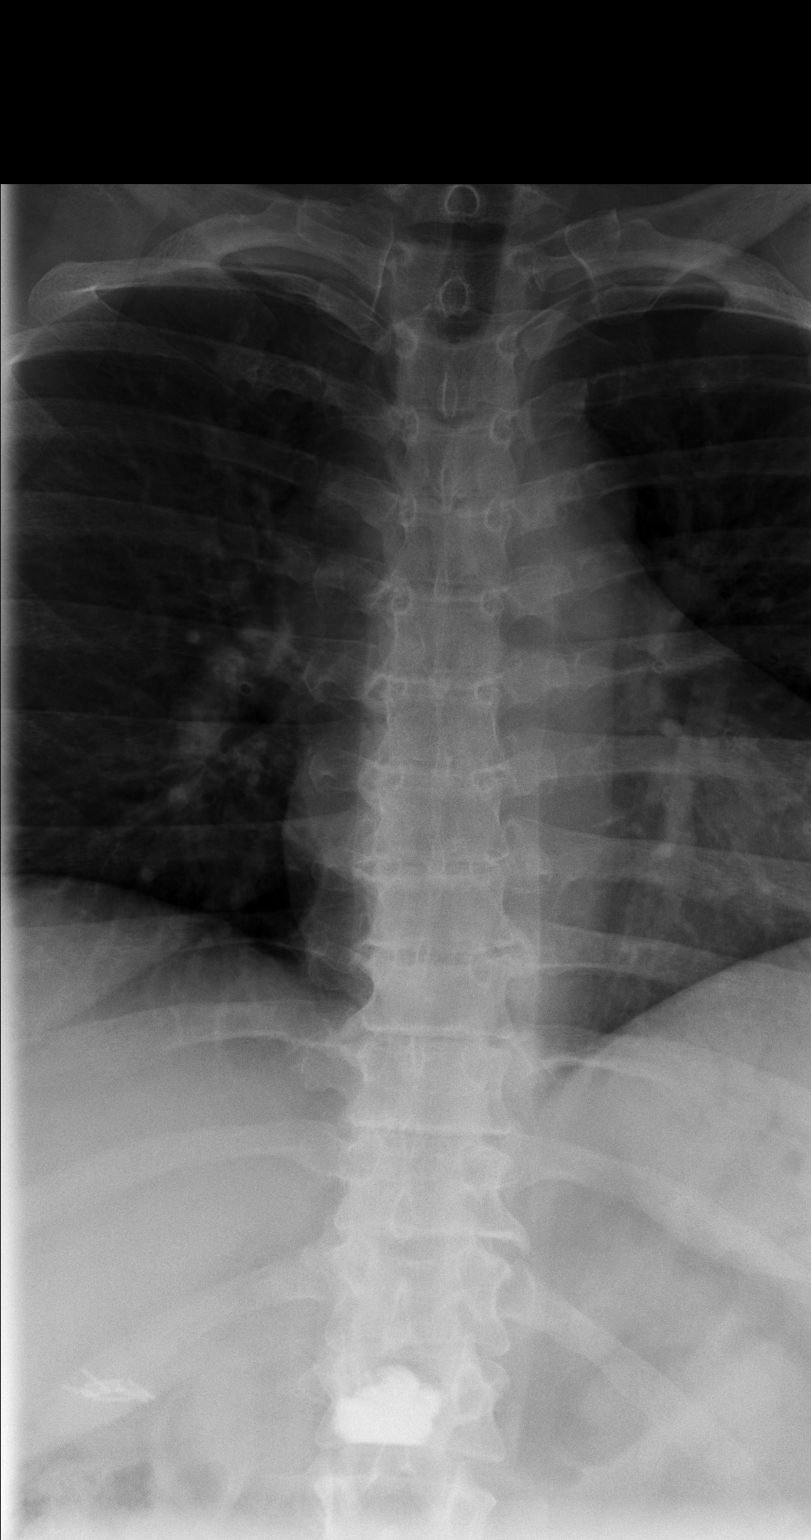

[t thoracic spine lat]
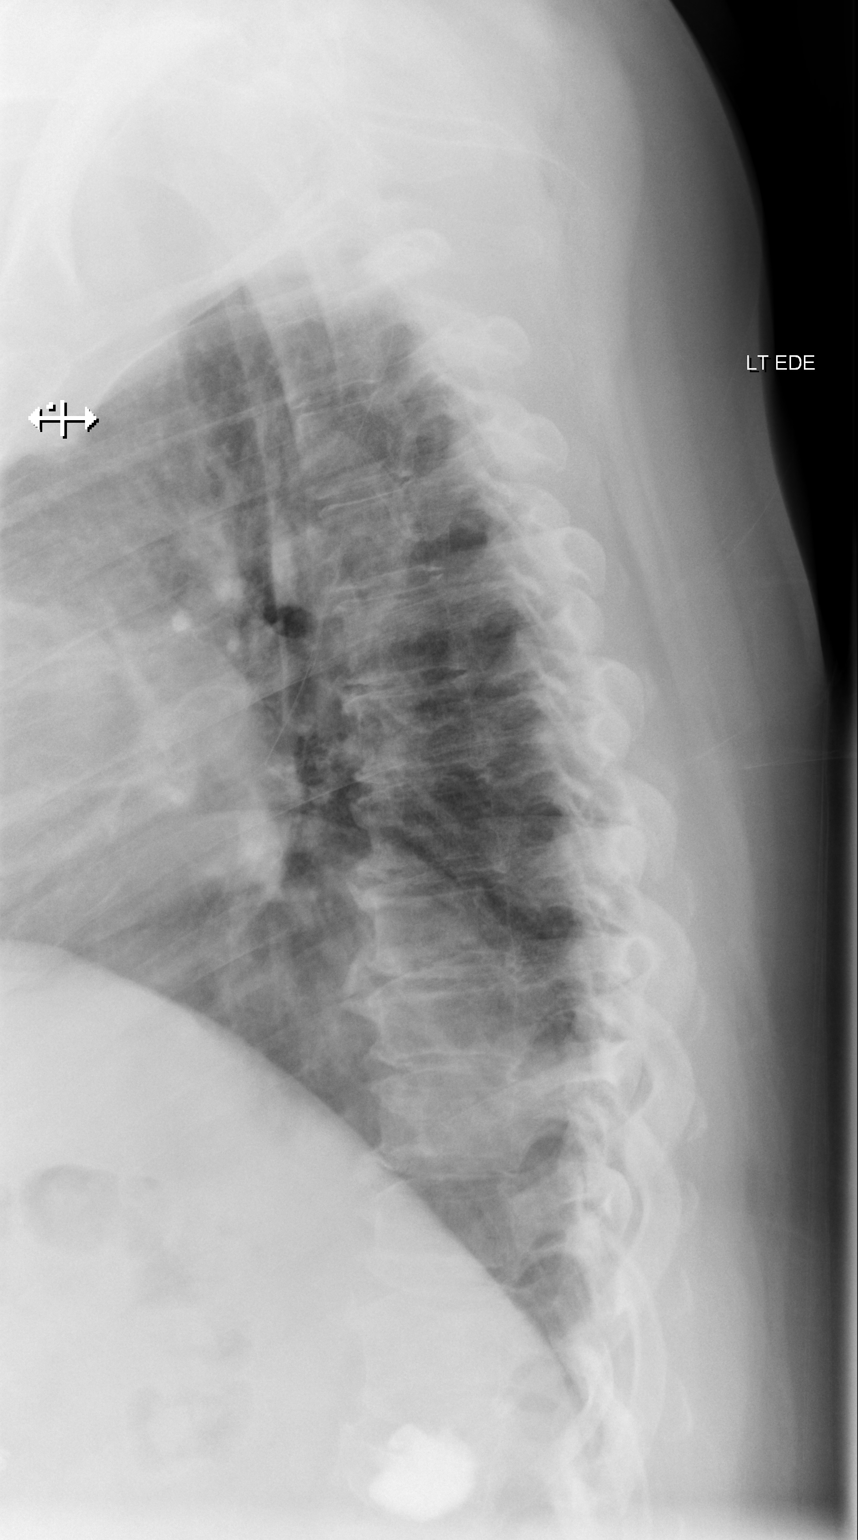

[t thoracic swimmers]
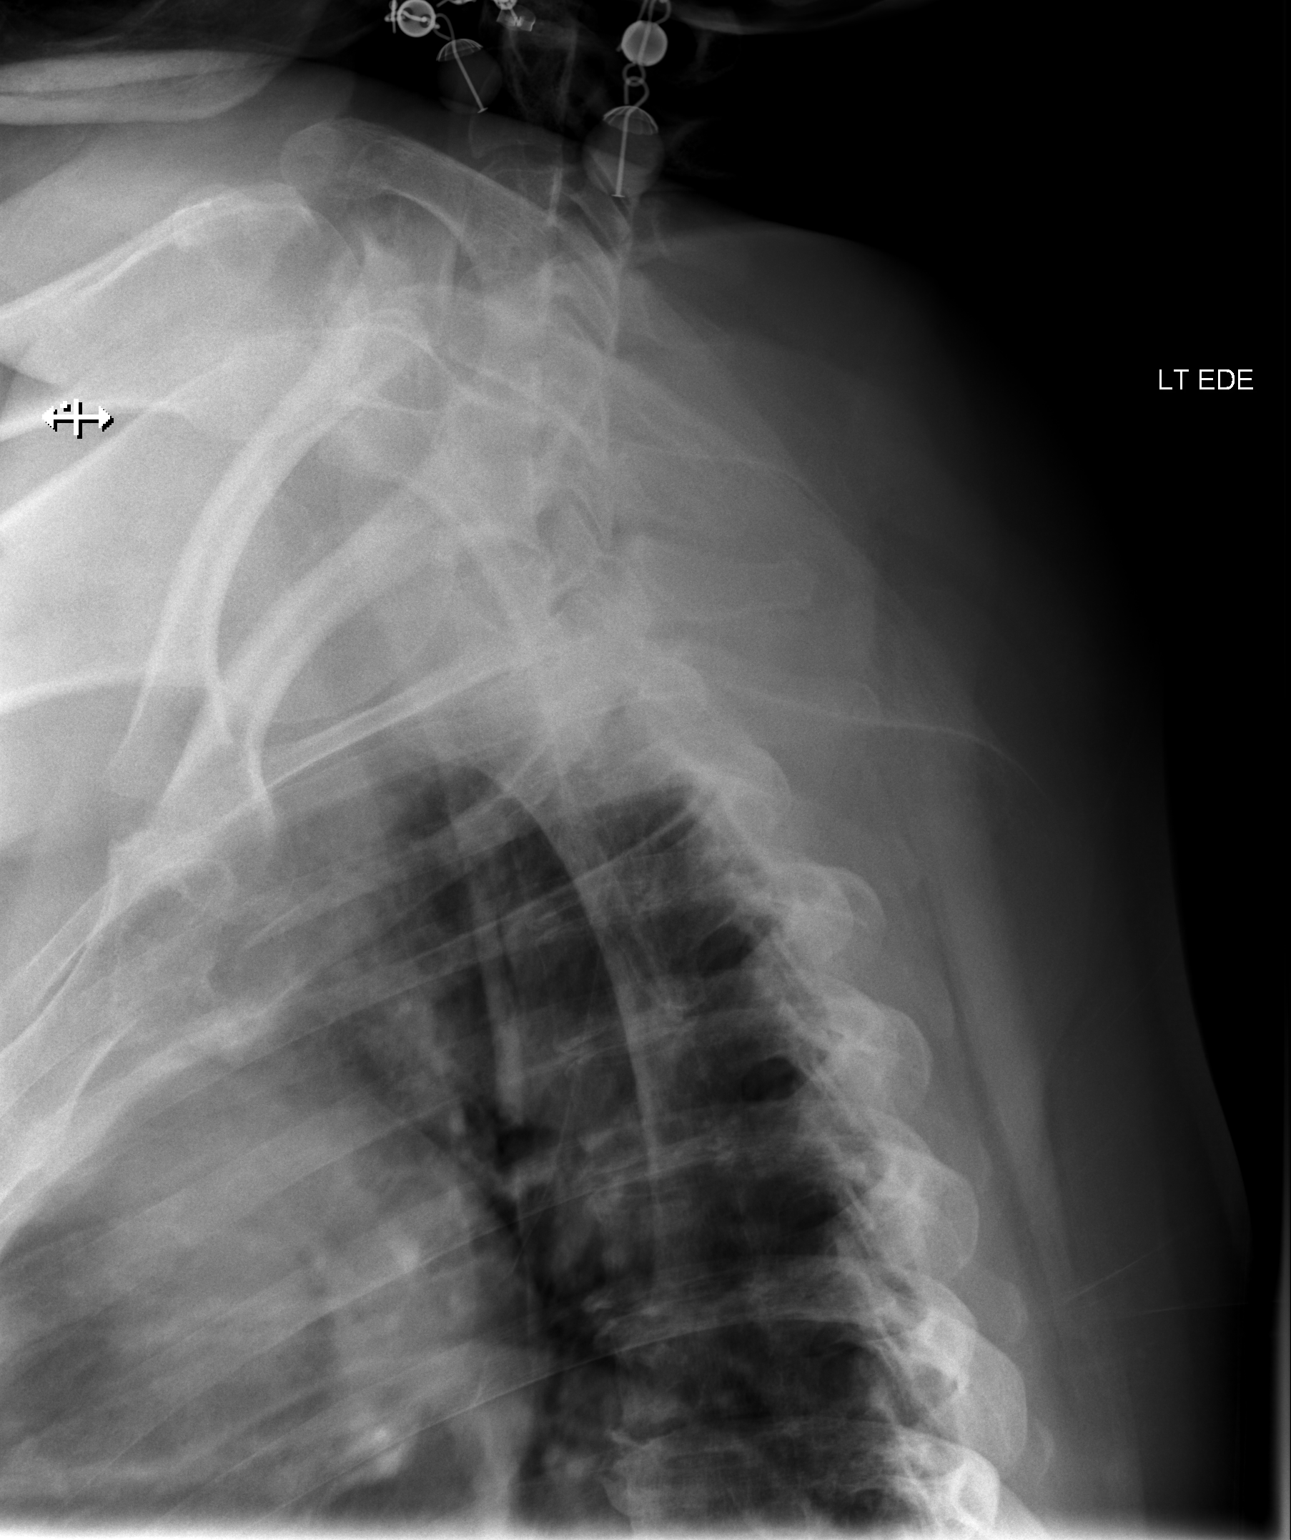

[3 of 3 positions shown; findings below may reference images not displayed]

FINDINGS: There is no evidence of thoracic spine fracture. Alignment is
normal. Status post kyphoplasty of L1 vertebral body. Mild anterior
osteophyte formation is noted in the lower thoracic spine.
IMPRESSION: Mild degenerative changes are noted in lower thoracic spine. No
fracture or spondylolisthesis is noted.

## 2015-05-28 ENCOUNTER — Telehealth: Payer: Self-pay | Admitting: Internal Medicine

## 2015-05-28 NOTE — Telephone Encounter (Signed)
Call to patient to confirm appointment for 05/29/15 at 1:45 lmtcb ° °

## 2015-05-29 ENCOUNTER — Other Ambulatory Visit: Payer: Self-pay

## 2015-05-29 ENCOUNTER — Ambulatory Visit (INDEPENDENT_AMBULATORY_CARE_PROVIDER_SITE_OTHER): Payer: Medicaid Other | Admitting: Internal Medicine

## 2015-05-29 ENCOUNTER — Encounter: Payer: Self-pay | Admitting: Internal Medicine

## 2015-05-29 VITALS — BP 110/70 | HR 54 | Temp 98.2°F | Wt 233.1 lb

## 2015-05-29 DIAGNOSIS — G8929 Other chronic pain: Secondary | ICD-10-CM | POA: Diagnosis not present

## 2015-05-29 DIAGNOSIS — B358 Other dermatophytoses: Secondary | ICD-10-CM | POA: Diagnosis not present

## 2015-05-29 DIAGNOSIS — G43009 Migraine without aura, not intractable, without status migrainosus: Secondary | ICD-10-CM

## 2015-05-29 DIAGNOSIS — I10 Essential (primary) hypertension: Secondary | ICD-10-CM

## 2015-05-29 DIAGNOSIS — Z1231 Encounter for screening mammogram for malignant neoplasm of breast: Secondary | ICD-10-CM

## 2015-05-29 DIAGNOSIS — E039 Hypothyroidism, unspecified: Secondary | ICD-10-CM | POA: Diagnosis not present

## 2015-05-29 DIAGNOSIS — M545 Low back pain, unspecified: Secondary | ICD-10-CM

## 2015-05-29 DIAGNOSIS — Z0000 Encounter for general adult medical examination without abnormal findings: Secondary | ICD-10-CM

## 2015-05-29 DIAGNOSIS — B359 Dermatophytosis, unspecified: Secondary | ICD-10-CM

## 2015-05-29 LAB — TSH: TSH: 29.186 u[IU]/mL — ABNORMAL HIGH (ref 0.350–4.500)

## 2015-05-29 MED ORDER — CLOTRIMAZOLE-BETAMETHASONE 1-0.05 % EX CREA: 1.0000 "application " | TOPICAL_CREAM | Freq: Two times a day (BID) | CUTANEOUS | Status: DC

## 2015-05-29 MED ORDER — RIZATRIPTAN BENZOATE 5 MG PO TABS: 5.0000 mg | ORAL_TABLET | Freq: Once | ORAL | Status: DC | PRN

## 2015-05-29 MED ORDER — LEVOTHYROXINE SODIUM 75 MCG PO TABS: 75.0000 ug | ORAL_TABLET | Freq: Every day | ORAL | Status: DC

## 2015-05-29 NOTE — Progress Notes (Signed)
   Subjective:    Patient ID: ARLETHA MARSCHKE, female    DOB: 06/30/1971, 44 y.o.   MRN: [id number]  HPI   44 y/o F w/ PMHx of hypothyroidism, GERD, migraines, and bipolar d/o who presents to clinic for hypothyroidism f/u. Please see problem list for further details.       Review of Systems  Constitutional: Negative for fever, activity change and appetite change.  Respiratory: Negative for shortness of breath.   Gastrointestinal: Negative for diarrhea and abdominal distention.  Musculoskeletal: Negative for back pain.  Neurological: Positive for dizziness (morning time ). Negative for syncope and weakness.       Objective:   Physical Exam  Constitutional: She appears well-developed and well-nourished. No distress.  HENT:  Mouth/Throat: Oropharynx is clear and moist.  Eyes: Conjunctivae are normal.  Cardiovascular: Normal rate, regular rhythm and normal heart sounds.   No murmur heard. Pulmonary/Chest: Effort normal and breath sounds normal. No respiratory distress. She has no wheezes. She has no rales.  Abdominal: Soft. Bowel sounds are normal. She exhibits no distension. There is no tenderness. There is no rebound and no guarding.  Musculoskeletal: Normal range of motion. She exhibits no edema.  Neurological: She is alert.  Skin: Skin is warm and dry.   rash on left lateral leg above ankle that is red with central clearing and some scaling          Assessment & Plan:  Please see problem based assessment and plan.

## 2015-05-29 NOTE — Patient Instructions (Signed)
STOP taking lisinopril

## 2015-05-30 ENCOUNTER — Telehealth: Payer: Self-pay | Admitting: Internal Medicine

## 2015-05-30 ENCOUNTER — Other Ambulatory Visit: Payer: Self-pay | Admitting: Internal Medicine

## 2015-05-30 LAB — BASIC METABOLIC PANEL WITH GFR
BUN: 14 mg/dL (ref 7–25)
CALCIUM: 9.1 mg/dL (ref 8.6–10.2)
CHLORIDE: 104 mmol/L (ref 98–110)
CO2: 23 mmol/L (ref 20–31)
CREATININE: 1.12 mg/dL — AB (ref 0.50–1.10)
GFR, EST AFRICAN AMERICAN: 69 mL/min (ref >=60)
GFR, EST NON AFRICAN AMERICAN: 59 mL/min — AB (ref >=60)
Glucose, Bld: 100 mg/dL — ABNORMAL HIGH (ref 65–99)
Potassium: 4.1 mmol/L (ref 3.5–5.3)
SODIUM: 139 mmol/L (ref 135–146)

## 2015-05-30 MED ORDER — NYSTATIN 100000 UNIT/GM EX POWD: Freq: Three times a day (TID) | CUTANEOUS | Status: DC

## 2015-05-30 NOTE — Telephone Encounter (Signed)
   Reason for call:   I received a call from [name] at 10.00am  indicating that she had a rash under her bilateral breast. She says it was there yesterday when she came to clinic for her visit yesterday but she did not complain of it because it was not severe, but today after she got out of the shower it became worse. She has had similar episodes in the past. All relived with Nystatin powder.   She denies fever. Rash is reddish.    Pertinent Data:   None   Assessment / Plan / Recommendations:   Will call in a prescription of Nystatin powder, and if no relief patient will come in to clinic during the week.    Onnie Boer, MD   05/30/2015, 10:01 AM

## 2015-06-01 DIAGNOSIS — I10 Essential (primary) hypertension: Secondary | ICD-10-CM | POA: Insufficient documentation

## 2015-06-01 MED ORDER — LEVOTHYROXINE SODIUM 100 MCG PO TABS: 100.0000 ug | ORAL_TABLET | Freq: Every day | ORAL | Status: DC

## 2015-06-01 NOTE — Assessment & Plan Note (Signed)
BP Readings from Last 3 Encounters:  05/29/15 110/70  03/02/15 114/77  01/29/15 100/72    Lab Results  Component Value Date   NA 139 05/29/2015   K 4.1 05/29/2015   CREATININE 1.12* 05/29/2015    Assessment: Blood pressure control:  well controlled Progress toward BP goal:   at goal Comments: BP on low normal side. Pt endoses morning time dizziness when sitting up from laying position. Also requesting refill of meclizine for dizziness. Recently started on propanolol 80 mg for headaches.   Plan: Medications:  Likely dizziness from tightly controlled BP. Stopped lisinopril, continue propanolol. Did not refill meclizine.  Educational resources provided:   Self management tools provided:   Other plans: f/u in 4-6 weeks for BP and thyroid check ( originally anticipated f/u in 3 months however now that lab results are in will need closer f/u. Message sent to front desk). Will check BMET as creatinine was elevated likely from [place] and reassess dizziness.

## 2015-06-01 NOTE — Assessment & Plan Note (Addendum)
Referral for mammogram placed. Declined pap smear, prefers to go to her Ob/gyn.

## 2015-06-01 NOTE — Assessment & Plan Note (Signed)
Denies any back pain. Not taking tramadol.

## 2015-06-01 NOTE — Assessment & Plan Note (Signed)
TSH was 0.085 in May and her synthroid dose of was reduced by half. Today in clinic her TSH is elevated at 29.186. She is also taking cytomel 5 mcg daily. She has been out of synthroid x 1 week. Given 1 week refill of synthroid while waiting for clinic labs to come back. Will send in rx from synthroid and have pt f/u in clinic in 4-6 weeks for TSH check.

## 2015-06-01 NOTE — Assessment & Plan Note (Signed)
Has a rash on left lateral leg above ankle that is red with central clearing and some scaling. Rash itches after she takes a hot shower. It does not bother her and she did not notice it until her neighbor mentioned it. 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 Rx for lotrisone cream applied topically BID.

## 2015-06-02 ENCOUNTER — Telehealth: Payer: Self-pay | Admitting: *Deleted

## 2015-06-02 NOTE — Telephone Encounter (Signed)
Received faxed PA request from pt's pharmacy for rizatriptan.  Per pt, imitrex does not work as well as the rizatriptan.  Pt is currently taking a beta-blocker (a requirement of medicaid) and has also tried NSAIDs in the past.  Request was submitted online via [place] [employer] and approved through 06/01/2016. Pharmacy aware.Kingsley Spittle Cassady8/2/201611:55 AM

## 2015-06-04 NOTE — Progress Notes (Signed)
Internal Medicine Clinic Attending  Case discussed with Dr. Truong soon after the resident saw the patient.  We reviewed the resident's history and exam and pertinent patient test results.  I agree with the assessment, diagnosis, and plan of care documented in the resident's note. 

## 2015-06-08 NOTE — Addendum Note (Signed)
Addended by: Remus Blake on: 06/08/2015 12:49 PM   Modules accepted: Orders

## 2015-06-24 ENCOUNTER — Encounter: Payer: Self-pay | Admitting: Internal Medicine

## 2015-06-24 ENCOUNTER — Emergency Department (HOSPITAL_COMMUNITY): Payer: Medicaid Other

## 2015-06-24 ENCOUNTER — Emergency Department (HOSPITAL_COMMUNITY)
Admission: EM | Admit: 2015-06-24 | Discharge: 2015-06-24 | Disposition: A | Discharge status: Home / Self Care | Payer: Medicaid Other | Attending: Emergency Medicine | Admitting: Emergency Medicine

## 2015-06-24 ENCOUNTER — Encounter (HOSPITAL_COMMUNITY): Payer: Self-pay | Admitting: *Deleted

## 2015-06-24 DIAGNOSIS — R2241 Localized swelling, mass and lump, right lower limb: Secondary | ICD-10-CM | POA: Diagnosis not present

## 2015-06-24 DIAGNOSIS — Z86718 Personal history of other venous thrombosis and embolism: Secondary | ICD-10-CM | POA: Insufficient documentation

## 2015-06-24 DIAGNOSIS — G43909 Migraine, unspecified, not intractable, without status migrainosus: Secondary | ICD-10-CM | POA: Diagnosis not present

## 2015-06-24 DIAGNOSIS — Z720 Tobacco use: Secondary | ICD-10-CM | POA: Diagnosis not present

## 2015-06-24 DIAGNOSIS — Z79899 Other long term (current) drug therapy: Secondary | ICD-10-CM | POA: Insufficient documentation

## 2015-06-24 DIAGNOSIS — M25561 Pain in right knee: Secondary | ICD-10-CM | POA: Diagnosis not present

## 2015-06-24 DIAGNOSIS — R197 Diarrhea, unspecified: Secondary | ICD-10-CM | POA: Insufficient documentation

## 2015-06-24 DIAGNOSIS — F319 Bipolar disorder, unspecified: Secondary | ICD-10-CM | POA: Diagnosis not present

## 2015-06-24 DIAGNOSIS — E039 Hypothyroidism, unspecified: Secondary | ICD-10-CM | POA: Insufficient documentation

## 2015-06-24 DIAGNOSIS — G8929 Other chronic pain: Secondary | ICD-10-CM | POA: Diagnosis not present

## 2015-06-24 MED ORDER — ACETAMINOPHEN 325 MG PO TABS
650.0000 mg | ORAL_TABLET | Freq: Once | ORAL | Status: AC
  Administered 2015-06-24: 650 mg via ORAL
  Filled 2015-06-24: qty 2

## 2015-06-24 MED ORDER — TRAMADOL HCL 50 MG PO TABS
50.0000 mg | ORAL_TABLET | Freq: Four times a day (QID) | ORAL | Status: DC | PRN
Start: 2015-06-24 — End: 2015-09-02

## 2015-06-24 NOTE — ED Provider Notes (Signed)
CSN: [id number]     Arrival date & time 06/24/15  1515 History  This chart was scribed for non-physician practitioner, Everlene Farrier, PA-C working with Samuel Jester, DO by Gwenyth Ober, ED scribe. This patient was seen in room TR07C/TR07C and the patient's care was started at 5:39 PM    Chief Complaint  Patient presents with  . Knee Pain   The history is provided by the patient. No language interpreter was used.   HPI Comments: [name] is a 44 y.o. female who presents to the Emergency Department complaining of constant, 8/10, medial right knee pain that started last night. Patient reports swelling of her knee and a cold sensation in her right foot as associated symptoms. She also notes baseline numbness to the medial aspect of her right knee that is unchanged today. Pt has not tried any treatment PTA. She normally treats chronic pain with Tramadol prescribed by Dr. Cleophas Dunker, her orthopedist, but reports that she ran out of the medication. She has a follow-up scheduled with her orthopedist in 2 days. Pt reports onset of pain started after she was getting into the shower and heard a "pop." She has a history of right knee surgery in 2001 and denies any problems following the procedure. Pt cannot take Ibuprofen due to a stomach ulcer. She denies calf pain or swelling.  Pt denies hitting her head or LOC. She also denies neck pain, back pain, nausea, vomiting, CP, SOB, congestion, abdominal pain and visual changes as associated symptoms.  Past Medical History  Diagnosis Date  . Deep venous thrombosis of leg 2012    completed year of coumadin   . Hx of measles   . Migraines   . Monilia infection 12/31/10  . Vaginal atrophy 03/04/11  . Dyspareunia 03/04/11  . Hypothyroidism     Hypothyroidism since 8th grade.  Managed by Dr. Sharl Ma, Endocrinology.  On synthroid.  No prior thyroid surgery/ablation.   . Bipolar 1 disorder     Followed by Mental Health.  All psychiatric medications prescribed  by Mental Health.  Stable for many years.     Past Surgical History  Procedure Laterality Date  . 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 Cesarean section  1998  . Tonsillectomy    . Wisdom tooth extraction    . Cholecystectomy    . Left elbow    . Abdominal hysterectomy  2000  . Appendectomy     Family History  Problem Relation Age of Onset  . Cancer Maternal Aunt   . Other Father    Social History  Substance Use Topics  . Smoking status: Current Every Day Smoker -- 1.00 packs/day    Types: Cigarettes    Last Attempt to Quit: 10/22/2012  . Smokeless tobacco: Never Used  . Alcohol Use: No   OB History    Gravida Para Term Preterm AB TAB SAB Ectopic Multiple Living   1 1 1       1      Review of Systems  Constitutional: Negative for fever and chills.  HENT: Negative for congestion.   Eyes: Negative for visual disturbance.  Respiratory: Negative for shortness of breath.   Cardiovascular: Negative for chest pain.  Gastrointestinal: Positive for diarrhea. Negative for nausea, vomiting and abdominal pain.  Genitourinary: Negative for difficulty urinating.  Musculoskeletal: Positive for joint swelling and arthralgias.  Skin: Negative for wound.  Neurological: Negative for weakness and numbness.      Allergies  Codeine; Meloxicam; Morphine and related; Naproxen; and Sulfonamide derivatives  Home Medications   Prior to Admission medications   Medication Sig Start Date End Date Taking? Authorizing Provider  ARIPiprazole (ABILIFY) 20 MG tablet Take 1 tablet (20 mg total) by mouth at bedtime. For mood control 12/31/14   Shuvon B Rankin, NP  buPROPion (WELLBUTRIN XL) 300 MG 24 hr tablet Take (one 300 mg tablet) along with (one 150 mg tablet) daily for depression 12/31/14   Shuvon B Rankin, NP  clotrimazole-betamethasone (LOTRISONE) cream Apply 1 application topically 2 (two) times daily. 05/29/15   Denton Brick, MD  diphenhydrAMINE (BENADRYL) 25 MG tablet Take 1 tablet  (25 mg total) by mouth every 6 (six) hours. 01/29/15   Toy Cookey, MD  gabapentin (NEURONTIN) 300 MG capsule Take 1 capsule (300 mg total) by mouth 3 (three) times daily. For neuropathic pain 02/11/15   Denton Brick, MD  levothyroxine (SYNTHROID) 100 MCG tablet Take 1 tablet (100 mcg total) by mouth daily. 06/01/15 05/31/16  Denton Brick, MD  levothyroxine (SYNTHROID, LEVOTHROID) 125 MCG tablet Take 1 tablet (125 mcg total) by mouth daily before breakfast. For hypothyroidism 03/02/15   Denton Brick, MD  liothyronine (CYTOMEL) 5 MCG tablet Take 1 tablet (5 mcg total) by mouth daily. For hypothyroidism 12/31/14   Shuvon B Rankin, NP  liothyronine (CYTOMEL) 5 MCG tablet TAKE 1 TABLET BY MOUTH DAILY 06/01/15   Denton Brick, MD  meclizine (ANTIVERT) 25 MG tablet TAKE 1 TABLET(25 MG) BY MOUTH TWICE DAILY AS NEEDED FOR DIZZINESS 04/27/15   Denton Brick, MD  nystatin (MYCOSTATIN) powder Apply topically 3 (three) times daily. 05/30/15   Ejiroghene Wendall Stade, MD  pantoprazole (PROTONIX) 40 MG tablet Take 1 tablet (40 mg total) by mouth daily. For reflux 01/29/15   Denton Brick, MD  propranolol (INDERAL) 80 MG tablet TAKE 1 TABLET BY MOUTH THREE TIMES DAILY 04/27/15   Denton Brick, MD  rizatriptan (MAXALT) 5 MG tablet Take 1 tablet (5 mg total) by mouth once as needed for migraine. May repeat in 2 hours if needed 05/29/15 05/28/16  Denton Brick, MD  simvastatin (ZOCOR) 40 MG tablet Take 1 tablet (40 mg total) by mouth every morning. 12/31/14   Shuvon B Rankin, NP  traMADol (ULTRAM) 50 MG tablet Take 1 tablet (50 mg total) by mouth every 6 (six) hours as needed. 06/24/15   Everlene Farrier, PA-C  traZODone (DESYREL) 100 MG tablet Take 1 tablet (100 mg total) by mouth at bedtime. For sleep 12/31/14   Shuvon B Rankin, NP   BP 115/79 mmHg  Pulse 70  Temp(Src) 97.9 F (36.6 C) (Oral)  Resp 16  SpO2 100% Physical Exam  Constitutional: She is oriented to person, place, and time. She appears well-developed and  well-nourished. No distress.  Non-toxic appearing  HENT:  Head: Normocephalic and atraumatic.  Eyes: Right eye exhibits no discharge. Left eye exhibits no discharge.  Cardiovascular:  Pulses:      Dorsalis pedis pulses are 2+ on the right side, and 2+ on the left side.       Posterior tibial pulses are 2+ on the right side, and 2+ on the left side.  Pulmonary/Chest: Effort normal. No respiratory distress.  Musculoskeletal: She exhibits edema and tenderness.  Right leg: No calf edema or tenderness. There is mild edema to her right knee. There is tenderness to the medial aspect of her right knee. No knee deformity. Patient is able to raise  her leg using her quadriceps muscle without difficulty. Patient is able to ambulate in the room without difficulty or assistance.  Neurological: She is alert and oriented to person, place, and time. She has normal reflexes. Coordination normal.  Normal and steady gait Able to ambulate without difficulty or assistance Good strength in bilateral LE Sensation intact to bilateral LE  Skin: Skin is warm and dry. No rash noted. She is not diaphoretic. No erythema. No pallor.  Psychiatric: She has a normal mood and affect. Her behavior is normal.  Nursing note and vitals reviewed.   ED Course  Procedures   DIAGNOSTIC STUDIES: Oxygen Saturation is 93% on RA, adequate by my interpretation.    COORDINATION OF CARE: 5:45 PM Discussed treatment plan with pt which includes follow-up with her orthopedic surgeon. Will prescribe Tramadol, but advised pt that narcotics will not be prescribed for the same pain at future visits. Pt agreed to plan.   Labs Review Labs Reviewed - No data to display  Imaging Review Dg Knee Complete 4 Views Right  06/24/2015   CLINICAL DATA:  Pain and swelling of the right knee since an episode of popping last evening, right total knee joint placement in 2012  EXAM: RIGHT KNEE - COMPLETE 4+ VIEW  COMPARISON:  Right knee series of  August 14, 2012  FINDINGS: The bones are adequately mineralized for age. The knee joint prosthesis is appropriately positioned. The interface with the native bone is normal. No joint effusion is evident.  IMPRESSION: There is no acute or chronic bony abnormality of the prosthetic right knee joint. The native bone is unremarkable.   Electronically Signed   By: David  [place] M.D.   On: 06/24/2015 17:21     EKG Interpretation None      Filed Vitals:   06/24/15 1520 06/24/15 1751  BP: 109/73 115/79  Pulse: 70 70  Temp: 98.3 F (36.8 C) 97.9 F (36.6 C)  TempSrc: Oral Oral  Resp: 16 16  SpO2: 93% 100%     MDM   Meds given in ED:  Medications  acetaminophen (TYLENOL) tablet 650 mg (650 mg Oral Given 06/24/15 1754)    New Prescriptions   TRAMADOL (ULTRAM) 50 MG TABLET    Take 1 tablet (50 mg total) by mouth every 6 (six) hours as needed.    Final diagnoses:  Right knee pain   This is a 44 y.o. female who presents to the Emergency Department complaining of constant, 8/10, medial right knee pain that started last night. Patient reports swelling of her knee and a cold sensation in her right foot as associated symptoms. She also notes baseline numbness to the medial aspect of her right knee that is unchanged today. Pt has not tried any treatment PTA. She normally treats chronic pain with Tramadol prescribed by Dr. Cleophas Dunker, her orthopedist, but reports that she ran out of the medication. She has a follow-up scheduled with her orthopedist in 2 days. Pt reports onset of pain started after she was getting into the shower and heard a "pop." She has a history of right knee surgery in 2001 and denies any problems following the procedure. Pt cannot take Ibuprofen due to a stomach ulcer. She denies calf pain or swelling.  On exam patient is afebrile nontoxic appearing. The patient does have mild dyspnea edema. No deformity noted. Patient has tenderness to the medial aspect of her right knee. No  changes in temperature to her feet. Patient is neurovascularly intact in her bilateral lower  extremities. Good pulses and capillary refill. Knee x-ray is unremarkable. Will place the patient and a knee sleeve and encouraged follow-up with orthopedic surgery. Patient reports contraindications to NSAIDs. Will provide patient with a small amount of tramadol for pain control until she see her orthopedic surgeon. I advised the patient to follow-up with their primary care provider this week. I advised the patient to return to the emergency department with new or worsening symptoms or new concerns. The patient verbalized understanding and agreement with plan.    I personally performed the services described in this documentation, which was scribed in my presence. The recorded information has been reviewed and is accurate.    Everlene Farrier, PA-C 06/24/15 1758  Samuel Jester, DO 06/26/15 1401

## 2015-06-24 NOTE — Discharge Instructions (Signed)
Knee Pain °The knee is the complex joint between your thigh and your lower leg. It is made up of bones, tendons, ligaments, and cartilage. The bones that make up the knee are: °· The femur in the thigh. °· The tibia and fibula in the lower leg. °· The patella or kneecap riding in the groove on the lower femur. °CAUSES  °Knee pain is a common complaint with many causes. A few of these causes are: °· Injury, such as: °¨ A ruptured ligament or tendon injury. °¨ Torn cartilage. °· Medical conditions, such as: °¨ Gout °¨ Arthritis °¨ Infections °· Overuse, over training, or overdoing a physical activity. °Knee pain can be minor or severe. Knee pain can accompany debilitating injury. Minor knee problems often respond well to self-care measures or get well on their own. More serious injuries may need medical intervention or even surgery. °SYMPTOMS °The knee is complex. Symptoms of knee problems can vary widely. Some of the problems are: °· Pain with movement and weight bearing. °· Swelling and tenderness. °· Buckling of the knee. °· Inability to straighten or extend your knee. °· Your knee locks and you cannot straighten it. °· Warmth and redness with pain and fever. °· Deformity or dislocation of the kneecap. °DIAGNOSIS  °Determining what is wrong may be very straight forward such as when there is an injury. It can also be challenging because of the complexity of the knee. Tests to make a diagnosis may include: °· Your caregiver taking a history and doing a physical exam. °· Routine X-rays can be used to rule out other problems. X-rays will not reveal a cartilage tear. Some injuries of the knee can be diagnosed by: °· Arthroscopy a surgical technique by which a small video camera is inserted through tiny incisions on the sides of the knee. This procedure is used to examine and repair internal knee joint problems. Tiny instruments can be used during arthroscopy to repair the torn knee cartilage (meniscus). °· Arthrography  is a radiology technique. A contrast liquid is directly injected into the knee joint. Internal structures of the knee joint then become visible on X-ray film. °· An MRI scan is a non X-ray radiology procedure in which magnetic fields and a computer produce two- or three-dimensional images of the inside of the knee. Cartilage tears are often visible using an MRI scanner. MRI scans have largely replaced arthrography in diagnosing cartilage tears of the knee. °· Blood work. °· Examination of the fluid that helps to lubricate the knee joint (synovial fluid). This is done by taking a sample out using a needle and a syringe. °TREATMENT °The treatment of knee problems depends on the cause. Some of these treatments are: °· Depending on the injury, proper casting, splinting, surgery, or physical therapy care will be needed. °· Give yourself adequate recovery time. Do not overuse your joints. If you begin to get sore during workout routines, back off. Slow down or do fewer repetitions. °· For repetitive activities such as cycling or running, maintain your strength and nutrition. °· Alternate muscle groups. For example, if you are a weight lifter, work the upper body on one day and the lower body the next. °· Either tight or weak muscles do not give the proper support for your knee. Tight or weak muscles do not absorb the stress placed on the knee joint. Keep the muscles surrounding the knee strong. °· Take care of mechanical problems. °· If you have flat feet, orthotics or special shoes may help.   See your caregiver if you need help. °· Arch supports, sometimes with wedges on the inner or outer aspect of the heel, can help. These can shift pressure away from the side of the knee most bothered by osteoarthritis. °· A brace called an "unloader" brace also may be used to help ease the pressure on the most arthritic side of the knee. °· If your caregiver has prescribed crutches, braces, wraps or ice, use as directed. The acronym  for this is PRICE. This means protection, rest, ice, compression, and elevation. °· Nonsteroidal anti-inflammatory drugs (NSAIDs), can help relieve pain. But if taken immediately after an injury, they may actually increase swelling. Take NSAIDs with food in your stomach. Stop them if you develop stomach problems. Do not take these if you have a history of ulcers, stomach pain, or bleeding from the bowel. Do not take without your caregiver's approval if you have problems with fluid retention, heart failure, or kidney problems. °· For ongoing knee problems, physical therapy may be helpful. °· Glucosamine and chondroitin are over-the-counter dietary supplements. Both may help relieve the pain of osteoarthritis in the knee. These medicines are different from the usual anti-inflammatory drugs. Glucosamine may decrease the rate of cartilage destruction. °· Injections of a corticosteroid drug into your knee joint may help reduce the symptoms of an arthritis flare-up. They may provide pain relief that lasts a few months. You may have to wait a few months between injections. The injections do have a small increased risk of infection, water retention, and elevated blood sugar levels. °· Hyaluronic acid injected into damaged joints may ease pain and provide lubrication. These injections may work by reducing inflammation. A series of shots may give relief for as long as 6 months. °· Topical painkillers. Applying certain ointments to your skin may help relieve the pain and stiffness of osteoarthritis. Ask your pharmacist for suggestions. Many over the-counter products are approved for temporary relief of arthritis pain. °· In some countries, doctors often prescribe topical NSAIDs for relief of chronic conditions such as arthritis and tendinitis. A review of treatment with NSAID creams found that they worked as well as oral medications but without the serious side effects. °PREVENTION °· Maintain a healthy weight. Extra pounds  put more strain on your joints. °· Get strong, stay limber. Weak muscles are a common cause of knee injuries. Stretching is important. Include flexibility exercises in your workouts. °· Be smart about exercise. If you have osteoarthritis, chronic knee pain or recurring injuries, you may need to change the way you exercise. This does not mean you have to stop being active. If your knees ache after jogging or playing basketball, consider switching to swimming, water aerobics, or other low-impact activities, at least for a few days a week. Sometimes limiting high-impact activities will provide relief. °· Make sure your shoes fit well. Choose footwear that is right for your sport. °· Protect your knees. Use the proper gear for knee-sensitive activities. Use kneepads when playing volleyball or laying carpet. Buckle your seat belt every time you drive. Most shattered kneecaps occur in car accidents. °· Rest when you are tired. °SEEK MEDICAL CARE IF:  °You have knee pain that is continual and does not seem to be getting better.  °SEEK IMMEDIATE MEDICAL CARE IF:  °Your knee joint feels hot to the touch and you have a high fever. °MAKE SURE YOU:  °· Understand these instructions. °· Will watch your condition. °· Will get help right away if you are not   doing well or get worse. °Document Released: 08/14/2007 Document Revised: 01/09/2012 Document Reviewed: 08/14/2007 °[employer]® Patient Information ©2015 [employer]. This information is not intended to replace advice given to you by your health care provider. Make sure you discuss any questions you have with your health care provider. ° °Knee Bracing °Knee braces are supports to help stabilize and protect an injured or painful knee. They come in many different styles. They should support and protect the knee without increasing the chance of other injuries to yourself or others. It is important not to have a false sense of security when using a brace. Knee braces that help you  to keep using your knee: °· Do not restore normal knee stability under high stress forces. °· May decrease some aspects of athletic performance. °Some of the different types of knee braces are: °· Prophylactic knee braces are designed to prevent or reduce the severity of knee injuries during sports that make injury to the knee more likely. °· Rehabilitative knee braces are designed to allow protected motion of: °¨ Injured knees. °¨ Knees that have been treated with or without surgery. °There is no evidence that the use of a supportive knee brace protects the graft following a successful anterior cruciate ligament (ACL) reconstruction. However, braces are sometimes used to:  °· Protect injured ligaments. °· Control knee movement during the initial healing period. °They may be used as part of the treatment program for the various injured ligaments or cartilage of the knee including the: °· Anterior cruciate ligament. °· Medial collateral ligament. °· Medial or lateral cartilage (meniscus). °· Posterior cruciate ligament. °· Lateral collateral ligament. °Rehabilitative knee braces are most commonly used: °· During crutch-assisted walking right after injury. °· During crutch-assisted walking right after surgery to repair the cartilage and/or cruciate ligament injury. °· For a short period of time, 2-8 weeks, after the injury or surgery. °The value of a rehabilitative brace as opposed to a cast or splint includes the: °· Ability to adjust the brace for swelling. °· Ability to remove the brace for examinations, icing, or showering. °· Ability to allow for movement in a controlled range of motion. °Functional knee braces give support to knees that have already been injured. They are designed to provide stability for the injured knee and provide protection after repair. Functional knee braces may not affect performance much. Lower extremity muscle strengthening, flexibility, and improvement in technique are more important  than bracing in treating ligamentous knee injuries. Functional braces are not a substitute for rehabilitation or surgical procedures. °Unloader/off-loader braces are designed to provide pain relief in arthritic knees. Patients with wear and tear arthritis from growing old or from an old cartilage injury (osteoarthritis) of the knee, and bowlegged (varus) or knock-knee (valgus) deformities, often develop increased pain in the arthritic side due to increased loading. Unloader/off-loader braces are made to reduce uneven loading in such knees. There is reduction in bowing out movement in bowlegged knees when the correct unloader brace is used. Patients with advanced osteoarthritis or severe varus or valgus alignment problems would not likely benefit from bracing. °Patellofemoral braces help the kneecap to move smoothly and well centered over the end of the femur in the knee.  °Most people who wear knee braces feel that they help. However, there is a lack of scientific evidence that knee braces are helpful at the level needed for athletic participation to prevent injury. In spite of this, athletes report an increase in knee stability, pain relief, performance improvement, and confidence   during athletics when using a brace.  °Different knee problems require different knee braces: °· Your caregiver may suggest one kind of knee brace after knee surgery. °· A caregiver may choose another kind of knee brace for support instead of surgery for some types of torn ligaments. °· You may also need one for pain in the front of your knee that is not getting better with strengthening and flexibility exercises. °Get your caregiver's advice if you want to try a knee brace. The caregiver will advise you on where to get them and provide a prescription when it is needed to fashion and/or fit the brace. °Knee braces are the least important part of preventing knee injuries or getting better following injury. Stretching, strengthening and  technique improvement are far more important in caring for and preventing knee injuries. When strengthening your knee, increase your activities a little at a time so as not to develop injuries from overuse. Work out an exercise plan with your caregiver and/or physical therapist to get the best program for you. Do not let a knee brace become a crutch. °Always remember, there are no braces which support the knee as well as your original ligaments and cartilage you were born with. Conditioning, proper warm-up, and stretching remain the most important parts of keeping your knees healthy. °HOW TO USE A KNEE BRACE °· During sports, knee braces should be used as directed by your caregiver. °· Make sure that the hinges are where the knee bends. °· Straps, tapes, or hook-and-loop tapes should be fastened around your leg as instructed. °· You should check the placement of the brace during activities to make sure that it has not moved. Poorly positioned braces can hurt rather than help you. °· To work well, a knee brace should be worn during all activities that put you at risk of knee injury. °· Warm up properly before beginning athletic activities. °HOME CARE INSTRUCTIONS °· Knee braces often get damaged during normal use. Replace worn-out braces for maximum benefit. °· Clean regularly with soap and water. °· Inspect your brace often for wear and tear. °· Cover exposed metal to protect others from injury. °· Durable materials may cost more, but last longer. °SEEK IMMEDIATE MEDICAL CARE IF:  °· Your knee seems to be getting worse rather than better. °· You have increasing pain or swelling in the knee. °· You have problems caused by the knee brace. °· You have increased swelling or inflammation (redness or soreness) in your knee. °· Your knee becomes warm and more painful and you develop an unexplained temperature over 101°F (38.3°C). °MAKE SURE YOU:  °· Understand these instructions. °· Will watch your condition. °· Will get  help right away if you are not doing well or get worse. °See your caregiver, physical therapist, or orthopedic surgeon for additional information. °Document Released: 01/07/2004 Document Revised: 03/03/2014 Document Reviewed: 04/15/2009 °[employer]® Patient Information ©2015 [employer]. This information is not intended to replace advice given to you by your health care provider. Make sure you discuss any questions you have with your health care provider. ° °

## 2015-06-24 NOTE — ED Notes (Signed)
Pt reports hx of right knee surgery in 2004, felt "something pop" in her knee and now having pain. Ambulatory at triage.

## 2015-07-07 ENCOUNTER — Ambulatory Visit: Payer: Self-pay

## 2015-07-09 ENCOUNTER — Encounter: Payer: Self-pay | Admitting: Internal Medicine

## 2015-07-16 ENCOUNTER — Encounter: Payer: Self-pay | Admitting: Internal Medicine

## 2015-07-25 ENCOUNTER — Other Ambulatory Visit: Payer: Self-pay | Admitting: Internal Medicine

## 2015-07-25 DIAGNOSIS — M62838 Other muscle spasm: Secondary | ICD-10-CM

## 2015-07-25 MED ORDER — CYCLOBENZAPRINE HCL 5 MG PO TABS: 5.0000 mg | ORAL_TABLET | Freq: Three times a day (TID) | ORAL | Status: DC | PRN

## 2015-07-25 NOTE — Progress Notes (Signed)
Pt with neck muscle spasms for the past 3-4 days without preceding injury or trauma after she woke up from sleep most likely positional in nature. She reports being on flexeril in the past with relief of muscle spasms. Will prescribe short supply of flexeril and instructed to follow-up with PCP if they do not resolve.   Dr. Johna Roles

## 2015-08-17 ENCOUNTER — Other Ambulatory Visit: Payer: Self-pay | Admitting: Internal Medicine

## 2015-08-17 ENCOUNTER — Telehealth: Payer: Self-pay | Admitting: *Deleted

## 2015-08-17 ENCOUNTER — Encounter: Payer: Self-pay | Admitting: Internal Medicine

## 2015-08-17 DIAGNOSIS — H109 Unspecified conjunctivitis: Secondary | ICD-10-CM | POA: Insufficient documentation

## 2015-08-17 MED ORDER — POLYMYXIN B-TRIMETHOPRIM 10000-0.1 UNIT/ML-% OP SOLN: 1.0000 [drp] | OPHTHALMIC | Status: DC

## 2015-08-17 NOTE — Progress Notes (Addendum)
   Reason for call:   I received a call from [name] at 1240  AM indicating possible pink eye.Marland Kitchen.   Pertinent Data:   Concerned about waking up to find her left eye "stuck" with mostly itching/burning sensation, and scant green discharge.    No prior sick contacts, use of contact lens, trauma, insect bites.  No changes in vision, fever, chills, headache, changes in vision.   Assessment / Plan / Recommendations:   Suspect infectious conjunctivitis and requested she be assessed in clinic though requested eye drops.   Advised her that antibacterial eye drops used in the absence of an infection may only put her at risk for future infections from resistant organisms and that she would be better off having her eye assessed tomorrow in clinic.   Agreeable to receiving OTC eye drops for symptom relief.  Advised her to come to the ED if she experienced worsening pain, fever, chills, changes in vision, sensation of foreign body in her eye to which she acknowledged understanding.  As always, pt is advised that if symptoms worsen or new symptoms arise, they should go to an urgent care facility or to to ER for further evaluation.   Wendy Arbourushil Clenton Esper V, MD   08/17/2015, 12:41 AM

## 2015-08-17 NOTE — Telephone Encounter (Signed)
-----   Message from Wendy Arbourushil Patel V, MD sent at 08/17/2015  6:02 AM EDT ----- Patient called overnight complaining of pink eye, and she was requesting antibiotic drops overnight. Advised her to take OTC drops and follow-up in clinic today. No red flag symptoms [see telephone note]. Can you please schedule her in this morning? Thank you.

## 2015-08-17 NOTE — Telephone Encounter (Signed)
We can not prescribe eye drops without examining the patient. I agree that she needs to be seen in [facility] or urgent care prior to prescribing medication. Thank you

## 2015-08-17 NOTE — Telephone Encounter (Signed)
Called pt about her appt - stated her eye is swollen, cannot see out of her eye and she does not drive and her mother 770 [age] years old ,I can't ask her" Stated she only need eye drops. Informed her no appts today unless some one cancels; appt on Wed @ 0845AM. Stated she cannot come ; only need eyedrops. Informed she can go to [facility]. Pt hung up the telephone.

## 2015-08-19 ENCOUNTER — Ambulatory Visit: Payer: Self-pay | Admitting: Internal Medicine

## 2015-09-02 ENCOUNTER — Encounter: Payer: Self-pay | Admitting: Internal Medicine

## 2015-09-02 ENCOUNTER — Ambulatory Visit (INDEPENDENT_AMBULATORY_CARE_PROVIDER_SITE_OTHER): Payer: Medicaid Other | Admitting: Internal Medicine

## 2015-09-02 VITALS — BP 113/74 | HR 58 | Temp 98.1°F | Ht 65.0 in | Wt 233.3 lb

## 2015-09-02 DIAGNOSIS — F17210 Nicotine dependence, cigarettes, uncomplicated: Secondary | ICD-10-CM

## 2015-09-02 DIAGNOSIS — N179 Acute kidney failure, unspecified: Secondary | ICD-10-CM

## 2015-09-02 DIAGNOSIS — Z0000 Encounter for general adult medical examination without abnormal findings: Secondary | ICD-10-CM

## 2015-09-02 DIAGNOSIS — E039 Hypothyroidism, unspecified: Secondary | ICD-10-CM | POA: Diagnosis not present

## 2015-09-02 DIAGNOSIS — I10 Essential (primary) hypertension: Secondary | ICD-10-CM | POA: Diagnosis not present

## 2015-09-02 DIAGNOSIS — F3130 Bipolar disorder, current episode depressed, mild or moderate severity, unspecified: Secondary | ICD-10-CM

## 2015-09-02 DIAGNOSIS — I951 Orthostatic hypotension: Secondary | ICD-10-CM

## 2015-09-02 DIAGNOSIS — L300 Nummular dermatitis: Secondary | ICD-10-CM

## 2015-09-02 DIAGNOSIS — Z23 Encounter for immunization: Secondary | ICD-10-CM | POA: Diagnosis not present

## 2015-09-02 DIAGNOSIS — E7800 Pure hypercholesterolemia, unspecified: Secondary | ICD-10-CM | POA: Diagnosis not present

## 2015-09-02 DIAGNOSIS — E785 Hyperlipidemia, unspecified: Secondary | ICD-10-CM

## 2015-09-02 DIAGNOSIS — E038 Other specified hypothyroidism: Secondary | ICD-10-CM

## 2015-09-02 MED ORDER — MECLIZINE HCL 25 MG PO TABS: ORAL_TABLET | ORAL | Status: DC

## 2015-09-02 MED ORDER — HYDROCORTISONE 1 % EX CREA: TOPICAL_CREAM | CUTANEOUS | Status: DC

## 2015-09-02 MED ORDER — HYDROXYZINE HCL 10 MG PO TABS: 10.0000 mg | ORAL_TABLET | Freq: Three times a day (TID) | ORAL | Status: DC | PRN

## 2015-09-02 MED ORDER — ARIPIPRAZOLE 20 MG PO TABS: 30.0000 mg | ORAL_TABLET | Freq: Every day | ORAL | Status: DC

## 2015-09-02 NOTE — Patient Instructions (Signed)
Start taking meclizine twice a day as needed for dizziness.   Start using hydrocortisone cream twice daily for your skin rashes. Also try and keep legs elevated to reduce swelling.

## 2015-09-03 DIAGNOSIS — L300 Nummular dermatitis: Secondary | ICD-10-CM | POA: Insufficient documentation

## 2015-09-03 LAB — LIPID PANEL
CHOLESTEROL TOTAL: 156 mg/dL (ref 100–199)
Chol/HDL Ratio: 3.9 ratio units (ref 0.0–4.4)
HDL: 40 mg/dL (ref >39)
LDL CALC: 81 mg/dL (ref 0–99)
TRIGLYCERIDES: 175 mg/dL — AB (ref 0–149)
VLDL Cholesterol Cal: 35 mg/dL (ref 5–40)

## 2015-09-03 LAB — TSH: TSH: 2.03 u[IU]/mL (ref 0.450–4.500)

## 2015-09-03 NOTE — Assessment & Plan Note (Signed)
LDL 81 today. Continue simvastatin and fish oil.

## 2015-09-03 NOTE — Assessment & Plan Note (Signed)
Follows w/ mental health. Updated med list to include buspar 30mg  and abilify 20mg  that she states was changed by her provider there.

## 2015-09-03 NOTE — Assessment & Plan Note (Signed)
Flu shot today 

## 2015-09-03 NOTE — Assessment & Plan Note (Signed)
On cytomel 5 mcg and synthroid . TSH today was WNL.   - continue above medications - f/u in 6 months for TSH level recheck.

## 2015-09-03 NOTE — Assessment & Plan Note (Addendum)
Pt had a rash on her left lateral ankle as detailed in physical exam that did not resolve with lotrisone cream as it was initially thought to be due to ringworm. She now has 3 new spots on her anterior shins that are itchy. Rash could be due to a nummular dermatitic or atopic dermatitis. Also could be due to venous stasis. Will try hydrocortisone 1% cream BID. If rash does not improve will do a punch biopsy in clinic.

## 2015-09-03 NOTE — Assessment & Plan Note (Signed)
Her most recent Cr was 1.12 up from 1.02 on her previous BMET. She was taken off lisinopril during her last visit due to orthostatic hypotension.   - recheck BMET today to follow creatinine.

## 2015-09-03 NOTE — Assessment & Plan Note (Signed)
BP Readings from Last 3 Encounters:  09/02/15 113/74  06/24/15 115/79  05/29/15 110/70    Lab Results  Component Value Date   NA 139 05/29/2015   K 4.1 05/29/2015   CREATININE 1.12* 05/29/2015    Assessment: Blood pressure control:  controlled Progress toward BP goal:   at goal Comments: Pt has sx of orthostatic hypotension although her orthostatic vitals are negative today. She is only on propanolol which she takes for her migraines. She was taken off her lisinopril at last visit due to these sx.   Plan: Medications:  continue current medications Educational resources provided:  (PATIENT REFUSED AT THIS TIME) Self management tools provided:  (PATIENT REFUSED) Other plans: f/u in 1 month to see if lightheadedness has improved

## 2015-09-03 NOTE — Assessment & Plan Note (Signed)
Pt has symptoms of orthostatic hypotension as she gets specifically light headed when she stands up. Initially states she gets dizzy when walking around but on further clarification it is only when she gets up from a seated or supine position. She used to take meclizine which helped with dizziness which was discontinued during her last visit when her lisinopril was stopped. Her orthostatic vitals were negative today ( BP 111/74 lying down and 113/74 after standing for 3 minutes). She has previously been on florinef for orthostatic hypotension dx in 2014 but was tapered off it after weight gain and HTN. Refusing to come off of propanolol which she takes for her migraines.   - restarted meclizine - can consider restarting florinef since she tolerated it in the past if lightheaded does not resolve with meclizine.

## 2015-09-03 NOTE — Progress Notes (Signed)
Subjective:   Patient ID: [name] female   DOB: 07-Jan-1971 44 y.o.   MRN: [id number]  HPI: [name] is a 44 y.o. F w/ PMHx outlined below who presents to clinic for a TSH level check and a rash on her left lateral ankle. Her ankle rash did not resolve with the antifungal cream prescribed during her last visit and she states rash has gotten larger. She also has new spots on her anterior shins which she is adamant are not insect bites.   Please see problem list for status of the pt's chronic medical problems.  Past Medical History  Diagnosis Date  . Deep venous thrombosis of leg (HCC) 2012    completed year of coumadin   . Hx of measles   . Migraines   . Monilia infection 12/31/10  . Vaginal atrophy 03/04/11  . Dyspareunia 03/04/11  . Hypothyroidism     Hypothyroidism since 8th grade.  Managed by Dr. Sharl Ma, Endocrinology.  On synthroid.  No prior thyroid surgery/ablation.   . Bipolar 1 disorder (HCC)     Followed by Mental Health.  All psychiatric medications prescribed by Mental Health.  Stable for many years.     Current Outpatient Prescriptions  Medication Sig Dispense Refill  . ARIPiprazole (ABILIFY) 20 MG tablet Take 1.5 tablets (30 mg total) by mouth at bedtime. For mood control 30 tablet 0  . buPROPion (WELLBUTRIN XL) 300 MG 24 hr tablet Take (one 300 mg tablet) along with (one 150 mg tablet) daily for depression 30 tablet 0  . diphenhydrAMINE (BENADRYL) 25 MG tablet Take 1 tablet (25 mg total) by mouth every 6 (six) hours. 12 tablet 0  . gabapentin (NEURONTIN) 300 MG capsule Take 1 capsule (300 mg total) by mouth 3 (three) times daily. For neuropathic pain 90 capsule 6  . hydrocortisone cream 1 % Apply to affected area 2 times daily 30 g 1  . levothyroxine (SYNTHROID) 100 MCG tablet Take 1 tablet (100 mcg total) by mouth daily. 30 tablet 11  . liothyronine (CYTOMEL) 5 MCG tablet Take 1 tablet (5 mcg total) by mouth daily. For hypothyroidism 90 tablet 1  .  meclizine (ANTIVERT) 25 MG tablet TAKE 1 TABLET(25 MG) BY MOUTH TWICE DAILY AS NEEDED FOR DIZZINESS 60 tablet 0  . nystatin (MYCOSTATIN) powder Apply topically 3 (three) times daily. 15 g 0  . pantoprazole (PROTONIX) 40 MG tablet Take 1 tablet (40 mg total) by mouth daily. For reflux 90 tablet 3  . propranolol (INDERAL) 80 MG tablet TAKE 1 TABLET BY MOUTH THREE TIMES DAILY 90 tablet 6  . rizatriptan (MAXALT) 5 MG tablet Take 1 tablet (5 mg total) by mouth once as needed for migraine. May repeat in 2 hours if needed 30 tablet 2  . simvastatin (ZOCOR) 40 MG tablet Take 1 tablet (40 mg total) by mouth every morning. 30 tablet 11  . traZODone (DESYREL) 100 MG tablet Take 1 tablet (100 mg total) by mouth at bedtime. For sleep 30 tablet 5   No current facility-administered medications for this visit.   Family History  Problem Relation Age of Onset  . Cancer Maternal Aunt   . Other Father    Social History   Social History  . Marital Status: Legally Separated    Spouse Name: N/A  . Number of Children: N/A  . Years of Education: N/A   Social History Main Topics  . Smoking status: Current Every Day Smoker -- 1.00 packs/day  Types: Cigarettes    Last Attempt to Quit: 10/22/2012  . Smokeless tobacco: Never Used     Comment: 1PPD  . Alcohol Use: No  . Drug Use: No  . Sexual Activity: Not on file     Comment: hysterectomy   Other Topics Concern  . Not on file   Social History Narrative   Review of Systems: Review of Systems  Constitutional: Negative for fever.  Musculoskeletal: Negative for back pain and falls.  Skin: Positive for rash (as noted in HPI).  Neurological: Positive for dizziness (on standing from a seating or supine position) and headaches (chronic).    Objective:  Physical Exam: Filed Vitals:   09/02/15 1559 09/02/15 1710 09/02/15 1713  BP: 115/65 111/74 113/74  Pulse: 58    Temp: 98.1 F (36.7 C)    TempSrc: Oral    Height: 5\' 5"  (1.651 m)    Weight: 233  lb 4.8 oz (105.824 kg)    SpO2: 97%     Physical Exam  Constitutional: She appears well-developed and well-nourished. No distress.  HENT:  Head: Normocephalic.  Cardiovascular: Regular rhythm.   No murmur heard. Pulmonary/Chest: Effort normal and breath sounds normal. No respiratory distress. She has no wheezes.  Abdominal: Soft. Bowel sounds are normal.  Musculoskeletal: She exhibits edema (trace pedal edema b/l).  Neurological: She is alert.  Skin: Skin is warm and dry. Rash (purple/pink 1.5 inch in diameter rash above left lateral ankle with slight central clearing w/o scale and irrregular borders, blanchable, 2 small 2-3 mm pink spots on rt anterior shin and one on left lateral anterior shin) noted.    Assessment & Plan:   Please see problem based assessment and plan.

## 2015-09-04 ENCOUNTER — Telehealth: Payer: Self-pay | Admitting: Internal Medicine

## 2015-09-04 LAB — BMP8+ANION GAP
ANION GAP: 17 mmol/L (ref 10.0–18.0)
BUN/Creatinine Ratio: 10 (ref 9–23)
BUN: 11 mg/dL (ref 6–24)
CALCIUM: 9.1 mg/dL (ref 8.7–10.2)
CO2: 24 mmol/L (ref 18–29)
Chloride: 104 mmol/L (ref 97–106)
Creatinine, Ser: 1.09 mg/dL — ABNORMAL HIGH (ref 0.57–1.00)
GFR calc Af Amer: 70 mL/min/{1.73_m2} (ref >59)
GFR, EST NON AFRICAN AMERICAN: 61 mL/min/{1.73_m2} (ref >59)
Glucose: 72 mg/dL (ref 65–99)
POTASSIUM: 4.2 mmol/L (ref 3.5–5.2)
Sodium: 145 mmol/L — ABNORMAL HIGH (ref 136–144)

## 2015-09-04 NOTE — Telephone Encounter (Signed)
Pt requesting lab result. Please call back.  °

## 2015-09-04 NOTE — Telephone Encounter (Signed)
Left voicemail for pt, she will need to call back for result.

## 2015-09-04 NOTE — Telephone Encounter (Signed)
Spoke with patient, read her results of TSH and lipid profile

## 2015-09-08 ENCOUNTER — Telehealth: Payer: Self-pay | Admitting: Internal Medicine

## 2015-09-08 NOTE — Telephone Encounter (Signed)
Called pt back and she states this is something new, she has taken otc cough syrup and it makes her cough more, she states her headache is horrible, she also states she cannot come to office to be seen because she cannot drive like this, her mother cannot bring her and transportation arrangements must be made 3 days ahead of time and she cannot wait 3 days, just wants the dr to call in some cough medicine and headache medicine. She was informed she would need to be seen, she abruptly said"thanks for calling" and hung up

## 2015-09-08 NOTE — Telephone Encounter (Signed)
Pt wants the doctor to call something for headache and cough. Please call pt back.

## 2015-09-08 NOTE — Progress Notes (Signed)
Internal Medicine Clinic Attending  Case discussed with Dr. Truong at the time of the visit.  We reviewed the resident's history and exam and pertinent patient test results.  I agree with the assessment, diagnosis, and plan of care documented in the resident's note.  

## 2015-09-20 ENCOUNTER — Other Ambulatory Visit: Payer: Self-pay | Admitting: Internal Medicine

## 2015-09-28 ENCOUNTER — Other Ambulatory Visit: Payer: Self-pay | Admitting: Internal Medicine

## 2015-09-29 ENCOUNTER — Ambulatory Visit: Payer: Self-pay

## 2015-10-15 ENCOUNTER — Ambulatory Visit (INDEPENDENT_AMBULATORY_CARE_PROVIDER_SITE_OTHER): Payer: Medicaid Other | Admitting: Pulmonary Disease

## 2015-10-15 ENCOUNTER — Encounter: Payer: Self-pay | Admitting: Pulmonary Disease

## 2015-10-15 VITALS — BP 111/78 | HR 62 | Temp 98.2°F | Ht 65.0 in | Wt 237.2 lb

## 2015-10-15 DIAGNOSIS — B370 Candidal stomatitis: Secondary | ICD-10-CM | POA: Diagnosis not present

## 2015-10-15 DIAGNOSIS — E039 Hypothyroidism, unspecified: Secondary | ICD-10-CM | POA: Diagnosis not present

## 2015-10-15 DIAGNOSIS — I951 Orthostatic hypotension: Secondary | ICD-10-CM

## 2015-10-15 DIAGNOSIS — G43909 Migraine, unspecified, not intractable, without status migrainosus: Secondary | ICD-10-CM | POA: Diagnosis not present

## 2015-10-15 DIAGNOSIS — I959 Hypotension, unspecified: Secondary | ICD-10-CM | POA: Diagnosis present

## 2015-10-15 DIAGNOSIS — G43009 Migraine without aura, not intractable, without status migrainosus: Secondary | ICD-10-CM

## 2015-10-15 MED ORDER — MECLIZINE HCL 25 MG PO TABS: ORAL_TABLET | ORAL | Status: DC

## 2015-10-15 MED ORDER — NYSTATIN 100000 UNIT/ML MT SUSP: 5.0000 mL | Freq: Four times a day (QID) | OROMUCOSAL | Status: AC

## 2015-10-15 MED ORDER — RIZATRIPTAN BENZOATE 5 MG PO TABS: 5.0000 mg | ORAL_TABLET | Freq: Once | ORAL | Status: DC | PRN

## 2015-10-15 MED ORDER — PROCHLORPERAZINE MALEATE 10 MG PO TABS: 10.0000 mg | ORAL_TABLET | Freq: Four times a day (QID) | ORAL | Status: DC | PRN

## 2015-10-15 NOTE — Progress Notes (Signed)
Subjective:    Patient ID: [name], female    DOB: 21-Aug-1971, 44 y.o.   MRN: [id number]  HPI [name] is a 44 year old woman with history of DVT s/p 1 year of coumadin, migraines, hypothyroidism, bipolar disorder presenting for follow up of her hypothyroidism.  She was last seen in clinic on 09/02/2015. Her TSH was normal and she was told to follow up in 6 months for recheck. She was also noted to have lightheadedness upon standing.  Today she reports her lightheadedness has improved.  Review of Systems Constitutional: no fevers/chills Eyes: no vision changes Ears, nose, mouth, throat, and face: no cough Respiratory: no shortness of breath Cardiovascular: no chest pain Gastrointestinal: no nausea/vomiting, no abdominal pain, no constipation, no diarrhea Genitourinary: no dysuria, no hematuria Integument: no rash Hematologic/lymphatic: no bleeding/bruising, no edema Musculoskeletal: no arthralgias, no myalgias Neurological: no paresthesias, no weakness  Past Medical History  Diagnosis Date  . Deep venous thrombosis of leg (HCC) 2012    completed year of coumadin   . Hx of measles   . Migraines   . Monilia infection 12/31/10  . Vaginal atrophy 03/04/11  . Dyspareunia 03/04/11  . Hypothyroidism     Hypothyroidism since 8th grade.  Managed by Dr. Sharl Ma, Endocrinology.  On synthroid.  No prior thyroid surgery/ablation.   . Bipolar 1 disorder (HCC)     Followed by Mental Health.  All psychiatric medications prescribed by Mental Health.  Stable for many years.      Current Outpatient Prescriptions on File Prior to Visit  Medication Sig Dispense Refill  . ARIPiprazole (ABILIFY) 20 MG tablet Take 1.5 tablets (30 mg total) by mouth at bedtime. For mood control 30 tablet 0  . buPROPion (WELLBUTRIN XL) 300 MG 24 hr tablet Take (one 300 mg tablet) along with (one 150 mg tablet) daily for depression 30 tablet 0  . busPIRone (BUSPAR) 30 MG tablet Take 30 mg by mouth  daily.    . diphenhydrAMINE (BENADRYL) 25 MG tablet Take 1 tablet (25 mg total) by mouth every 6 (six) hours. 12 tablet 0  . gabapentin (NEURONTIN) 300 MG capsule TAKE 1 CAPSULE(300 MG) BY MOUTH THREE TIMES DAILY FOR NEUROPATHIC PAIN 90 capsule 0  . hydrocortisone cream 1 % Apply to affected area 2 times daily 30 g 1  . levothyroxine (SYNTHROID) 100 MCG tablet Take 1 tablet (100 mcg total) by mouth daily. 30 tablet 11  . liothyronine (CYTOMEL) 5 MCG tablet Take 1 tablet (5 mcg total) by mouth daily. For hypothyroidism 90 tablet 1  . meclizine (ANTIVERT) 25 MG tablet TAKE 1 TABLET(25 MG) BY MOUTH TWICE DAILY AS NEEDED FOR DIZZINESS 60 tablet 0  . nystatin (MYCOSTATIN) powder Apply topically 3 (three) times daily. 15 g 0  . pantoprazole (PROTONIX) 40 MG tablet Take 1 tablet (40 mg total) by mouth daily. For reflux 90 tablet 3  . propranolol (INDERAL) 80 MG tablet TAKE 1 TABLET BY MOUTH THREE TIMES DAILY 270 tablet 6  . rizatriptan (MAXALT) 5 MG tablet Take 1 tablet (5 mg total) by mouth once as needed for migraine. May repeat in 2 hours if needed 30 tablet 2  . simvastatin (ZOCOR) 40 MG tablet Take 1 tablet (40 mg total) by mouth every morning. 30 tablet 11  . traZODone (DESYREL) 100 MG tablet Take 1 tablet (100 mg total) by mouth at bedtime. For sleep 30 tablet 5   No current facility-administered medications on file prior to visit.  Today's Vitals   10/15/15 1526  BP: 111/78  Pulse: 62  Temp: 98.2 F (36.8 C)  TempSrc: Oral  Height: 5\' 5"  (1.651 m)  Weight: 237 lb 3.2 oz (107.593 kg)  SpO2: 98%  PainSc: 0-No pain    Objective:  Physical Exam  Constitutional: She appears well-developed and well-nourished. No distress.  HENT:  White debris over gums and throughout mouth. Area of pain on inner lip without obvious lesion.  Eyes: Conjunctivae are normal.  Neck: Neck supple.  Cardiovascular: Normal rate, regular rhythm and normal heart sounds.   No murmur heard. Pulmonary/Chest:  Effort normal and breath sounds normal. No respiratory distress. She has no wheezes. She has no rales.  Musculoskeletal: Normal range of motion. She exhibits no edema.  Neurological: She is alert.  Skin: Skin is warm and dry.    Assessment & Plan:  Please refer to problem based charting.

## 2015-10-17 NOTE — Assessment & Plan Note (Signed)
TSH recheck timing as previously noted at last visit

## 2015-10-17 NOTE — Assessment & Plan Note (Signed)
Assessment: Symptoms have improved.  Plan: Continue to monitor.

## 2015-10-17 NOTE — Assessment & Plan Note (Signed)
Assessment: History of thrush. Appears to have oral candidiasis today.  Plan: -Nystatin 500k u suspension QID for 7 days.

## 2015-10-17 NOTE — Assessment & Plan Note (Signed)
Assessment: She reports her migraine headaches are not well controlled.   Plan:  -Refill rizatriptan -Continue propranolol -Add Compazine 10mg  q6hr prn acute attacks -May consider adding topiramate for prophylaxis

## 2015-10-20 NOTE — Progress Notes (Signed)
Case discussed with Dr. Krall soon after the resident saw the patient.  We reviewed the resident's history and exam and pertinent patient test results.  I agree with the assessment, diagnosis, and plan of care documented in the resident's note. 

## 2015-10-20 NOTE — Addendum Note (Signed)
Addended by: Doneen PoissonKLIMA, Passion Lavin D on: 10/20/2015 02:45 PM   Modules accepted: Level of Service

## 2015-10-26 ENCOUNTER — Other Ambulatory Visit: Payer: Self-pay | Admitting: Internal Medicine

## 2015-10-27 ENCOUNTER — Other Ambulatory Visit: Payer: Self-pay | Admitting: Pulmonary Disease

## 2015-11-09 ENCOUNTER — Telehealth: Payer: Self-pay | Admitting: Internal Medicine

## 2015-11-09 NOTE — Telephone Encounter (Signed)
INTERNAL MEDICINE RESIDENCY PROGRAM After-Hours Telephone Call    Reason for call:   I received a call from [name] on 11/09/2015 at 5:55 PM indicating pt has had a cold x 5 days and would like to know what she should take for it.     Pertinent Data:   Pt has clear rhinorrhea, congestion in her head and chest, cough productive of yellow sputum and a sore throat. She had a measure temp of 101.5F this morning. Denies n/v, and diarrhea. Her son who she lives with was sick over winter break. She is eating well and has mild stomach pain.     Assessment / Plan / Recommendations:   Most likely pt has a viral URI. Advised patient to take flonase for nasal congestion, mucinex for congestion, and tylenol for fever. If symptoms persists for 5-7 more days advised her to make an appt with clinic for further evaluation.   As always, pt is advised that if symptoms worsen or new symptoms arise, they should go to an urgent care facility or to to ER for further evaluation.    Denton Brickiana M Keeton Kassebaum, MD   11/09/2015, 5:55 PM

## 2015-11-12 ENCOUNTER — Ambulatory Visit (INDEPENDENT_AMBULATORY_CARE_PROVIDER_SITE_OTHER): Payer: Medicaid Other | Admitting: Internal Medicine

## 2015-11-12 ENCOUNTER — Encounter: Payer: Self-pay | Admitting: Internal Medicine

## 2015-11-12 VITALS — BP 102/55 | HR 66 | Temp 98.7°F | Wt 234.8 lb

## 2015-11-12 DIAGNOSIS — R05 Cough: Secondary | ICD-10-CM

## 2015-11-12 DIAGNOSIS — H93299 Other abnormal auditory perceptions, unspecified ear: Secondary | ICD-10-CM

## 2015-11-12 DIAGNOSIS — B001 Herpesviral vesicular dermatitis: Secondary | ICD-10-CM

## 2015-11-12 DIAGNOSIS — G43009 Migraine without aura, not intractable, without status migrainosus: Secondary | ICD-10-CM

## 2015-11-12 DIAGNOSIS — J069 Acute upper respiratory infection, unspecified: Secondary | ICD-10-CM

## 2015-11-12 DIAGNOSIS — J029 Acute pharyngitis, unspecified: Secondary | ICD-10-CM | POA: Diagnosis not present

## 2015-11-12 DIAGNOSIS — R509 Fever, unspecified: Secondary | ICD-10-CM

## 2015-11-12 DIAGNOSIS — G43909 Migraine, unspecified, not intractable, without status migrainosus: Secondary | ICD-10-CM | POA: Diagnosis not present

## 2015-11-12 DIAGNOSIS — F17210 Nicotine dependence, cigarettes, uncomplicated: Secondary | ICD-10-CM

## 2015-11-12 DIAGNOSIS — B002 Herpesviral gingivostomatitis and pharyngotonsillitis: Secondary | ICD-10-CM

## 2015-11-12 DIAGNOSIS — J3489 Other specified disorders of nose and nasal sinuses: Secondary | ICD-10-CM

## 2015-11-12 MED ORDER — HYDROCODONE-HOMATROPINE 5-1.5 MG/5ML PO SYRP: 5.0000 mL | ORAL_SOLUTION | Freq: Four times a day (QID) | ORAL | Status: DC | PRN

## 2015-11-12 MED ORDER — PROCHLORPERAZINE MALEATE 10 MG PO TABS: 10.0000 mg | ORAL_TABLET | Freq: Four times a day (QID) | ORAL | Status: DC | PRN

## 2015-11-12 MED ORDER — DEXTROMETHORPHAN HBR 15 MG/5ML PO SYRP: 10.0000 mL | ORAL_SOLUTION | Freq: Four times a day (QID) | ORAL | Status: DC | PRN

## 2015-11-12 MED ORDER — FLUTICASONE PROPIONATE 50 MCG/ACT NA SUSP: 2.0000 | Freq: Every day | NASAL | Status: DC

## 2015-11-12 NOTE — Progress Notes (Signed)
Medicine attending: Medical history, presenting problems, physical findings, and medications, reviewed with resident physician Dr Alexa Richardson on the day of the patient visit and I concur with her evaluation and management plan. 

## 2015-11-12 NOTE — Assessment & Plan Note (Signed)
Patient has evidence of superficial crusting of lesions superior to vermilion border of right upper lip. She states she develop painful blisters in that area one week ago. She admits to a history of oral herpes many years ago, but does not have frequent recurrences. Area appears to be healing. HIV negative 2015.   Plan: -Monitor -If patient develops frequent reccurences, consider antiviral therapy

## 2015-11-12 NOTE — Patient Instructions (Signed)
FOR YOUR COUGH AND CONGESTION: -TAKE FLONASE NASAL SPRAY 2 SPRAYS EACH NOSTRIL ONCE A DAY -USE DEXTROMETHORPHAN EVERY 4 HOURS FOR COUGH -YOU CAN USE THE HYCODAN COUGH SYRUP AT BEDTIME TO HELP YOU SLEEP -GET PLENTY OF REST AND DRINK PLENTY OF FLUIDS  FOR YOUR BLISTERS- THEY ARE LIKELY DUE TO HERPES VIRUS 1 (ORAL HERPES) IF YOU GET FREQUENT RECURRENCES, LET us KNOW AND WE CAN PRESCRIBE A MEDICINE.  Cold Sore A cold sore (fever blister) is a skin infection caused by the herpes simplex virus (HSV-1). HSV-1 is closely related to the virus that causes genital herpes (HSV-2), but they are not the same even though both viruses can cause oral and genital infections. Cold sores are small, fluid-filled sores inside of the mouth or on the lips, gums, nose, chin, cheeks, or fingers.  The herpes simplex virus can be easily passed (contagious) to other people through close personal contact, such as kissing or sharing personal items. The virus can also spread to other parts of the body, such as the eyes or genitals. Cold sores are contagious until the sores crust over completely. They often heal within 2 weeks.  Once a person is infected, the herpes simplex virus remains permanently in the body. Therefore, there is no cure for cold sores, and they often recur when a person is tired, stressed, sick, or gets too much sun. Additional factors that can cause a recurrence include hormone changes in menstruation or pregnancy, certain drugs, and cold weather.  CAUSES  Cold sores are caused by the herpes simplex virus. The virus is spread from person to person through close contact, such as through kissing, touching the affected area, or sharing personal items such as lip balm, razors, or eating utensils.  SYMPTOMS  The first infection may not cause symptoms. If symptoms develop, the symptoms often go through different stages. Here is how a cold sore develops:   Tingling, itching, or burning is felt 1-2 days before the  outbreak.   Fluid-filled blisters appear on the lips, inside the mouth, nose, or on the cheeks.   The blisters start to ooze clear fluid.   The blisters dry up and a yellow crust appears in its place.   The crust falls off.  Symptoms depend on whether it is the initial outbreak or a recurrence. Some other symptoms with the first outbreak may include:   Fever.   Sore throat.   Headache.   Muscle aches.   Swollen neck glands.  DIAGNOSIS  A diagnosis is often made based on your symptoms and looking at the sores. Sometimes, a sore may be swabbed and then examined in the lab to make a final diagnosis. If the sores are not present, blood tests can find the herpes simplex virus.  TREATMENT  There is no cure for cold sores and no vaccine for the herpes simplex virus. Within 2 weeks, most cold sores go away on their own without treatment. Medicines cannot make the infection go away, but medicine can help relieve some of the pain associated with the sores, can work to stop the virus from multiplying, and can also shorten healing time. Medicine may be in the form of creams, gels, pills, or a shot.  HOME CARE INSTRUCTIONS   Only take over-the-counter or prescription medicines for pain, discomfort, or fever as directed by your caregiver. Do not use aspirin.   Use a cotton-tip swab to apply creams or gels to your sores.   Do not touch the sores or pick  the scabs. Wash your hands often. Do not touch your eyes without washing your hands first.   Avoid kissing, oral sex, and sharing personal items until sores heal.   Apply an ice pack on your sores for 10-15 minutes to ease any discomfort.   Avoid hot, cold, or salty foods because they may hurt your mouth. Eat a soft, bland diet to avoid irritating the sores. Use a straw to drink if you have pain when drinking out of a glass.   Keep sores clean and dry to prevent an infection of other tissues.   Avoid the sun and limit stress  if these things trigger outbreaks. If sun causes cold sores, apply sunscreen on the lips before being out in the sun.  SEEK MEDICAL CARE IF:   You have a fever or persistent symptoms for more than 2-3 days.   You have a fever and your symptoms suddenly get worse.   You have pus, not clear fluid, coming from the sores.   You have redness that is spreading.   You have pain or irritation in your eye.   You get sores on your genitals.   Your sores do not heal within 2 weeks.   You have a weakened immune system.   You have frequent recurrences of cold sores.  MAKE SURE YOU:   Understand these instructions.  Will watch your condition.  Will get help right away if you are not doing well or get worse.   This information is not intended to replace advice given to you by your health care provider. Make sure you discuss any questions you have with your health care provider.   Document Released: 10/14/2000 Document Revised: 11/07/2014 Document Reviewed: 02/29/2012 Elsevier Interactive Patient Education [employer]2016 [employer]

## 2015-11-12 NOTE — Assessment & Plan Note (Signed)
Patient presents with 1 week history of fever, chill, aches, sinus pressure, ear popping, nasal congestion, sore throat, productive cough. She denies any nausea, vomiting or diarrhea. She has not tried anything for her symptoms. She admits to her son being recently ill. History and physical exam is consistent with viral URI.  Plan: -Rest, hydration -Tylenol for fever and aches -Flonase 2 sprays each nostril for congestion -Dextromethorphan q4h prn cough -Hycodan QHS prn for cough -Return if symptoms worsen or do not improve in 2 weeks

## 2015-11-12 NOTE — Assessment & Plan Note (Signed)
Patient requests refill on compazine for her chronic migraines.  Plan: -refilled compazine

## 2015-11-12 NOTE — Progress Notes (Signed)
Subjective:    Patient ID: [name], female    DOB: 1971-10-02, 45 y.o.   MRN: [id number]  HPI [name] is a 45 y.o. female with PMHx of migraines, hypothyroidism who presents to the clinic for cough. Please see A&P for the status of the patient's chronic medical problems.   Past Medical History  Diagnosis Date  . Deep venous thrombosis of leg (HCC) 2012    completed year of coumadin   . Hx of measles   . Migraines   . Monilia infection 12/31/10  . Vaginal atrophy 03/04/11  . Dyspareunia 03/04/11  . Hypothyroidism     Hypothyroidism since 8th grade.  Managed by Dr. Sharl Ma, Endocrinology.  On synthroid.  No prior thyroid surgery/ablation.   . Bipolar 1 disorder (HCC)     Followed by Mental Health.  All psychiatric medications prescribed by Mental Health.  Stable for many years.      Outpatient Encounter Prescriptions as of 11/12/2015  Medication Sig  . ARIPiprazole (ABILIFY) 20 MG tablet Take 1.5 tablets (30 mg total) by mouth at bedtime. For mood control  . buPROPion (WELLBUTRIN XL) 300 MG 24 hr tablet Take (one 300 mg tablet) along with (one 150 mg tablet) daily for depression  . busPIRone (BUSPAR) 30 MG tablet Take 30 mg by mouth daily.  Marland Kitchen dextromethorphan 15 MG/5ML syrup Take 10 mLs (30 mg total) by mouth 4 (four) times daily as needed for cough.  . diphenhydrAMINE (BENADRYL) 25 MG tablet Take 1 tablet (25 mg total) by mouth every 6 (six) hours.  . fluticasone (FLONASE) 50 MCG/ACT nasal spray Place 2 sprays into both nostrils daily.  Marland Kitchen gabapentin (NEURONTIN) 300 MG capsule TAKE 1 CAPSULE(300 MG) BY MOUTH THREE TIMES DAILY FOR NEUROPATHIC PAIN  . HYDROcodone-homatropine (HYCODAN) 5-1.5 MG/5ML syrup Take 5 mLs by mouth every 6 (six) hours as needed for cough.  . hydrocortisone cream 1 % Apply to affected area 2 times daily  . levothyroxine (SYNTHROID) 100 MCG tablet Take 1 tablet (100 mcg total) by mouth daily.  Marland Kitchen liothyronine (CYTOMEL) 5 MCG tablet Take 1 tablet (5 mcg  total) by mouth daily. For hypothyroidism  . meclizine (ANTIVERT) 25 MG tablet TAKE 1 TABLET(25 MG) BY MOUTH TWICE DAILY AS NEEDED FOR DIZZINESS  . nystatin (MYCOSTATIN) powder Apply topically 3 (three) times daily.  . pantoprazole (PROTONIX) 40 MG tablet Take 1 tablet (40 mg total) by mouth daily. For reflux  . prochlorperazine (COMPAZINE) 10 MG tablet Take 1 tablet (10 mg total) by mouth every 6 (six) hours as needed (migraine headache).  . propranolol (INDERAL) 80 MG tablet TAKE 1 TABLET BY MOUTH THREE TIMES DAILY  . rizatriptan (MAXALT) 5 MG tablet Take 1 tablet (5 mg total) by mouth once as needed for migraine. May repeat in 2 hours if needed  . simvastatin (ZOCOR) 40 MG tablet Take 1 tablet (40 mg total) by mouth every morning.  . traZODone (DESYREL) 100 MG tablet Take 1 tablet (100 mg total) by mouth at bedtime. For sleep  . [DISCONTINUED] prochlorperazine (COMPAZINE) 10 MG tablet Take 1 tablet (10 mg total) by mouth every 6 (six) hours as needed (migraine headache).   No facility-administered encounter medications on file as of 11/12/2015.    Family History  Problem Relation Age of Onset  . Cancer Maternal Aunt   . Other Father     Social History   Social History  . Marital Status: Legally Separated    Spouse Name: N/A  .  Number of Children: N/A  . Years of Education: N/A   Occupational History  . Not on file.   Social History Main Topics  . Smoking status: Current Every Day Smoker -- 1.00 packs/day    Types: Cigarettes    Last Attempt to Quit: 10/22/2012  . Smokeless tobacco: Never Used     Comment: 1PPD  . Alcohol Use: No  . Drug Use: No  . Sexual Activity: Not on file     Comment: hysterectomy   Other Topics Concern  . Not on file   Social History Narrative   Review of Systems General: Admits to fever, chills, fatigue. Denies change in appetite and diaphoresis.  HEENT: Admits to nasal congestion, sore throat, ear popping, sinus pressure.  Respiratory:  Admits to productive cough. Denies SOB, chest tightness, and wheezing.   Cardiovascular: Denies chest pain and palpitations.  Gastrointestinal: Denies nausea, vomiting, abdominal pain, diarrhea Musculoskeletal: Admits to generalized aches and myalgias. Skin: Admits to painful blisters under nose.   Neurological: Denies dizziness, headaches, weakness, lightheadedness     Objective:   Physical Exam Filed Vitals:   11/12/15 1356  BP: 102/55  Pulse: 66  Temp: 98.7 F (37.1 C)  TempSrc: Oral  Weight: 234 lb 12.8 oz (106.505 kg)  SpO2: 96%   General: Vital signs reviewed.  Patient is well-developed and well-nourished, in no acute distress and cooperative with exam.  Eyes: EOMI, conjunctivae normal, no conjunctival injection. Nose: Erythematous, edematous nasal turbinates.  Mouth: Normal posterior oropharynx, no exudate. No evidence of thrush. Neck: No anterior cervical lymphadenopathy.  Cardiovascular: RRR, S1 normal, S2 normal, no murmurs, gallops, or rubs. Pulmonary/Chest: Clear to auscultation bilaterally, no wheezes, rales, or rhonchi. Abdominal: Soft, non-tender, non-distended.  Extremities: No lower extremity edema bilaterally Skin: Superficial crusting of lesions superior to vermilion border of right upper lip. Psychiatric: Normal mood and affect. speech and behavior is normal.      Assessment & Plan:   Please see problem based assessment and plan.

## 2015-11-17 ENCOUNTER — Other Ambulatory Visit: Payer: Self-pay | Admitting: Pulmonary Disease

## 2015-11-22 ENCOUNTER — Other Ambulatory Visit: Payer: Self-pay | Admitting: Internal Medicine

## 2015-12-09 ENCOUNTER — Telehealth: Payer: Self-pay | Admitting: Internal Medicine

## 2015-12-09 NOTE — Telephone Encounter (Signed)
   Reason for call:   I received a call from Ms. Liston Alba at 5:30 PM indicating she had an ulcer on her gums just below the bottom teeth.   Pertinent Data:   Ulcer has been present for a few days.    Spicy and sour foods irritate it.  She wears dentures.  She has hx of herpes labialis and oral candidiasis.   Assessment / Plan / Recommendations:   Advised she come in for acute visit tomorrow since it is difficult to know if this ulcer is infectious or canker sore.  Advised swishing with warm salt water and avoiding foods that irritate it until she can be seen.  As always, pt is advised that if symptoms worsen or new symptoms arise, they should go to an urgent care facility or to to ER for further evaluation.   Yolanda Manges, DO   12/09/2015, 5:46 PM

## 2015-12-10 ENCOUNTER — Other Ambulatory Visit: Payer: Self-pay | Admitting: Internal Medicine

## 2015-12-19 ENCOUNTER — Other Ambulatory Visit: Payer: Self-pay | Admitting: Pulmonary Disease

## 2015-12-22 ENCOUNTER — Telehealth: Payer: Self-pay | Admitting: Pulmonary Disease

## 2015-12-22 NOTE — Telephone Encounter (Signed)
Pt requesting simvastatin to be filled.  °

## 2015-12-22 NOTE — Telephone Encounter (Signed)
Added to another encounter and sent to PCP

## 2015-12-23 MED ORDER — SIMVASTATIN 40 MG PO TABS: 40.0000 mg | ORAL_TABLET | Freq: Every morning | ORAL | Status: DC

## 2015-12-23 MED ORDER — PANTOPRAZOLE SODIUM 40 MG PO TBEC: 40.0000 mg | DELAYED_RELEASE_TABLET | Freq: Every day | ORAL | Status: DC

## 2015-12-23 NOTE — Telephone Encounter (Signed)
Pt aware rx have been sent to [facility]

## 2015-12-24 ENCOUNTER — Ambulatory Visit: Payer: Self-pay | Admitting: Internal Medicine

## 2015-12-29 ENCOUNTER — Telehealth: Payer: Self-pay | Admitting: Pulmonary Disease

## 2015-12-29 ENCOUNTER — Encounter: Payer: Self-pay | Admitting: Internal Medicine

## 2015-12-29 ENCOUNTER — Ambulatory Visit (INDEPENDENT_AMBULATORY_CARE_PROVIDER_SITE_OTHER): Payer: Medicaid Other | Admitting: Internal Medicine

## 2015-12-29 VITALS — BP 102/61 | HR 66 | Temp 98.1°F | Ht 65.0 in | Wt 233.9 lb

## 2015-12-29 DIAGNOSIS — N6459 Other signs and symptoms in breast: Secondary | ICD-10-CM

## 2015-12-29 DIAGNOSIS — E039 Hypothyroidism, unspecified: Secondary | ICD-10-CM

## 2015-12-29 DIAGNOSIS — N644 Mastodynia: Secondary | ICD-10-CM

## 2015-12-29 NOTE — Telephone Encounter (Signed)
Called pt, states she has an appt for today

## 2015-12-29 NOTE — Progress Notes (Signed)
Subjective:   Patient ID: [name] female   DOB: 12-07-1970 45 y.o.   MRN: [id number]  HPI: [name] is a 45 y.o. female w/ PMHx of Bipolar 1 Disorder, h/o DVT, hypothyroidism, and h/o migraines, presents to the clinic today for an acute visit for breast pain. Patient says the pain is on her left breast, says she noticed it 2 days ago in the shower. Noticed a small red area that was very tender to the touch and looked like a boil. Has not had this before. No systemic symptoms of infection; no fever, chills, nausea, vomiting, diarrhea. No nipple discharge. Had a mammogram 2 years ago that was normal.   Also states she feels like her hair is falling out. Wants to have her thyroid checked. Does state her hair is longer now than it has been. No dry skin, fatigue, dry eyes, or other changes that she has notice.   Past Medical History  Diagnosis Date  . Deep venous thrombosis of leg (HCC) 2012    completed year of coumadin   . Hx of measles   . Migraines   . Monilia infection 12/31/10  . Vaginal atrophy 03/04/11  . Dyspareunia 03/04/11  . Hypothyroidism     Hypothyroidism since 8th grade.  Managed by Dr. Sharl Ma, Endocrinology.  On synthroid.  No prior thyroid surgery/ablation.   . Bipolar 1 disorder (HCC)     Followed by Mental Health.  All psychiatric medications prescribed by Mental Health.  Stable for many years.     Current Outpatient Prescriptions  Medication Sig Dispense Refill  . ARIPiprazole (ABILIFY) 20 MG tablet Take 1.5 tablets (30 mg total) by mouth at bedtime. For mood control 30 tablet 0  . buPROPion (WELLBUTRIN XL) 300 MG 24 hr tablet Take (one 300 mg tablet) along with (one 150 mg tablet) daily for depression 30 tablet 0  . busPIRone (BUSPAR) 30 MG tablet Take 30 mg by mouth daily.    Marland Kitchen dextromethorphan 15 MG/5ML syrup Take 10 mLs (30 mg total) by mouth 4 (four) times daily as needed for cough. 120 mL 0  . diphenhydrAMINE (BENADRYL) 25 MG tablet Take 1 tablet  (25 mg total) by mouth every 6 (six) hours. 12 tablet 0  . fluticasone (FLONASE) 50 MCG/ACT nasal spray Place 2 sprays into both nostrils daily. 16 g 2  . gabapentin (NEURONTIN) 300 MG capsule TAKE 1 CAPSULE(300 MG) BY MOUTH THREE TIMES DAILY FOR NEUROPATHIC PAIN 270 capsule 0  . HYDROcodone-homatropine (HYCODAN) 5-1.5 MG/5ML syrup Take 5 mLs by mouth every 6 (six) hours as needed for cough. 120 mL 0  . hydrocortisone cream 1 % Apply to affected area 2 times daily 30 g 1  . levothyroxine (SYNTHROID) 100 MCG tablet Take 1 tablet (100 mcg total) by mouth daily. 30 tablet 11  . liothyronine (CYTOMEL) 5 MCG tablet Take 1 tablet (5 mcg total) by mouth daily. For hypothyroidism 90 tablet 1  . meclizine (ANTIVERT) 25 MG tablet TAKE 1 TABLET(25 MG) BY MOUTH TWICE DAILY AS NEEDED FOR DIZZINESS 60 tablet 0  . nystatin (MYCOSTATIN) powder Apply topically 3 (three) times daily. 15 g 0  . pantoprazole (PROTONIX) 40 MG tablet Take 1 tablet (40 mg total) by mouth daily. For reflux 90 tablet 3  . prochlorperazine (COMPAZINE) 10 MG tablet TAKE 1 TABLET(10 MG) BY MOUTH EVERY 6 HOURS AS NEEDED FOR MIGRAINE HEADACHE 30 tablet 0  . propranolol (INDERAL) 80 MG tablet TAKE 1 TABLET BY MOUTH  THREE TIMES DAILY 90 tablet 5  . rizatriptan (MAXALT) 5 MG tablet Take 1 tablet (5 mg total) by mouth once as needed for migraine. May repeat in 2 hours if needed 30 tablet 2  . simvastatin (ZOCOR) 40 MG tablet Take 1 tablet (40 mg total) by mouth every morning. 30 tablet 11  . traZODone (DESYREL) 100 MG tablet Take 1 tablet (100 mg total) by mouth at bedtime. For sleep 30 tablet 5   No current facility-administered medications for this visit.    Review of Systems: General: Denies fever, chills, diaphoresis, appetite change and fatigue.  Respiratory: Denies SOB, DOE, cough, and wheezing.   Cardiovascular: Denies chest pain and palpitations.  Gastrointestinal: Denies nausea, vomiting, abdominal pain, and diarrhea.  Genitourinary:  Denies dysuria, increased frequency, and flank pain. Endocrine: Positive for hair loss. Denies hot or cold intolerance, polyuria, and polydipsia. Musculoskeletal: Denies myalgias, back pain, joint swelling, arthralgias and gait problem.  Skin: Positive for left breast boil. Denies pallor, rash and wounds.  Neurological: Denies dizziness, seizures, syncope, weakness, lightheadedness, numbness and headaches.  Psychiatric/Behavioral: Denies mood changes, and sleep disturbances.  Objective:   Physical Exam: Filed Vitals:   12/29/15 1541  BP: 102/61  Pulse: 66  Temp: 98.1 F (36.7 C)  TempSrc: Oral  Height:  (1.651 m)  Weight: 233 lb 14.4 oz (106.096 kg)  SpO2: 97%    General: Obese white female, alert, cooperative, NAD. HEENT: PERRL, EOMI. Moist mucus membranes. Hair appears healthy, just past shoulder length, normal density.  Neck: Full range of motion without pain, supple, no lymphadenopathy or carotid bruits Lungs: Clear to ascultation bilaterally, normal work of respiration, no wheezes, rales, rhonchi Heart: RRR, no murmurs, gallops, or rubs Breast: Large pendulous breasts with heterogenous breast tissue. No lymphadenopathy bilaterally. Left breast with small, erythematous lesion in the upper outer quadrant, but somewhat close to the nipple. Slightly tender to palpation, 1 x 1.5 cm in size, no induration or fluctuance. Appears to be healing. No drainage or discharge. No underlying lump, mass, or suspicion for tunneling or abscess. No nipple discharge. Rest of breast exam benign.  Abdomen: Soft, non-tender, non-distended, BS + Extremities: No cyanosis, clubbing, or edema Neurologic: Alert & oriented X3, cranial nerves II-XII intact, strength grossly intact, sensation intact to light touch   Assessment & Plan:   Please see problem based assessment and plan.

## 2015-12-29 NOTE — Patient Instructions (Signed)
1. Please make a follow up for 6 weeks.   2. Please take all medications as previously prescribed.  If your breast wound doesn't get better or gets worse over the next 72 hours, please call the clinic and come back and see [place].   I will call you if there are abnormalities with your TSH.   3. If you have worsening of your symptoms or new symptoms arise, please call the clinic ([phone]), or go to the ER immediately if symptoms are severe.  You have done a great job in taking all your medications. Please continue to do this.

## 2015-12-29 NOTE — Telephone Encounter (Signed)
Pt states that she has an abscess on left breast would like appt. Offered appt on Thursday but pt denied wanted to come in sooner. Pt asked to speak with nurse.

## 2015-12-30 LAB — TSH: TSH: 2.27 u[IU]/mL (ref 0.450–4.500)

## 2015-12-30 NOTE — Assessment & Plan Note (Signed)
Patient with small, mildly erythematous, somewhat tender breast lesion for the past 2 days. Says she noticed it in the shower and it was slightly painful. About 1 x 1.5 cm in size. No obvious fluctuance, discharge, induration, or underlying mass or suspicious findings. Suspect this is a small skin lesion (ie: a boil) rather than mastitis, or significant breast abscess. Appears to be healing well on its own at this time. No systemic symptoms. No nipple discharge. No lymphadenopathy. Patient has heterogenous density breasts, quite normal breast exam. Previous mammogram in 2015 normal.  -Watch and wait at this time. If not improved in 48-72 hours, she will call and return for follow up for possible antibiotics, mammogram, and [place].

## 2015-12-30 NOTE — Assessment & Plan Note (Signed)
Patient states she feels her hair is falling out. Suspect this is primarily related to her hair being longer than the recent past, maybe is noticing mild, normal physiologic hair loss more. Checked TSH, within normal limits. Hair appears normal, does not look thin, not falling out in clumps, only one or two strands if she runs her fingers through.  -If this continues to be an issue, can pursue further workup

## 2015-12-31 ENCOUNTER — Ambulatory Visit: Payer: Self-pay | Admitting: Internal Medicine

## 2016-01-01 ENCOUNTER — Telehealth: Payer: Self-pay | Admitting: Internal Medicine

## 2016-01-01 NOTE — Telephone Encounter (Signed)
   Reason for call:   I received a call from Ms. Liston AlbaMelanie B Roland at 12:10 AM indicating that she is having low back pain.  She is requesting tramadol and flexeril for pain because this is the only thing that has helped her pain in the past.   Pertinent Data:   She reports a history of chronic intermittent low back pain and is s/p low back surgery about 2 years ago.  The pain has returned in the past 3 days and is dull, non-radiating, 10/10.    This is the same pain she has experienced in the past.  She denies loss of sensation/saddle anesthesia, loss of bowel/bladder control or loss of strength.  She is able to walk and go about her routine without difficulty.  Tylenol is not helping.  Recent clinic note (from 3 days ago) reviewed and there was no complaint of back pain at that visit.   Assessment / Plan / Recommendations:   I informed her that I could not send in prescriptions for Tramadol or flexeril and that she would need to be evaluated in clinic.  I advised she continue Tylenol but be careful to limit to 3g or less per day as needed.  I advised she continue conservative measures, including ice/heat, rest.    I advised she arrange a clinic visit for further evaluation and to determine what additional therapy may be needed.  As always, pt is advised that if symptoms worsen or new symptoms arise, they should go to an urgent care facility or to to ER for further evaluation.   Yolanda MangesAlex M Makenzie Vittorio, DO   01/01/2016, 12:21 AM

## 2016-01-02 NOTE — Progress Notes (Signed)
Internal Medicine Clinic Attending  Case discussed with Dr. Jones at the time of the visit.  We reviewed the resident's history and exam and pertinent patient test results.  I agree with the assessment, diagnosis, and plan of care documented in the resident's note.  

## 2016-01-06 ENCOUNTER — Other Ambulatory Visit: Payer: Self-pay | Admitting: Pulmonary Disease

## 2016-01-06 NOTE — Telephone Encounter (Signed)
If it is truly not any worse, she does not need an antibiotic. She should use warm compresses on the area when she is at home. If it continues to not go away, she should still be seen, because then she may need a mammogram, ultrasound, etc. Regardless, she would then need a re-evaluation. Antibiotic is not appropriate at this time if it is truly unchanged.

## 2016-01-06 NOTE — Telephone Encounter (Signed)
Pt states she now has 2 areas of concern, a new "bump popped up this am" the other has not healed and she needs an abx. She is very emotional, she states she cannot be seen and her motherinlaw is close to death now

## 2016-01-06 NOTE — Telephone Encounter (Signed)
She should be seen if it is worse before just giving her an antibiotic.

## 2016-01-06 NOTE — Telephone Encounter (Signed)
Dr. Yetta BarreJones, just to clarify, you do want pt to come back regarding her boil for more of workup, not just calling in antibiotic?

## 2016-01-06 NOTE — Telephone Encounter (Signed)
Pt states she still have the same problem with the left breast since she left the office on 12/29/2015. Requesting antibiotic, please call pt back.

## 2016-01-06 NOTE — Telephone Encounter (Signed)
Called patient, she said she cannot come in, would not be able to get in for several days due to a current situation with her mother in law being hospitalized and in critical condition.  She is requesting antibiotic now and agrees to schedule to be seen for further work up but is insistent and tearful in her request to not have to come in until things settle down with her family.  She isn't reporting any worsening, just no improvement.

## 2016-01-07 NOTE — Telephone Encounter (Signed)
Called pt with dr Yetta Barrejones in the room, pt is very calm today stating the problem is not any better, she is ask to make and appt and come in for abx, she states she will need to call back and make the appt later, call ended

## 2016-01-11 ENCOUNTER — Ambulatory Visit (INDEPENDENT_AMBULATORY_CARE_PROVIDER_SITE_OTHER): Payer: Medicaid Other | Admitting: Internal Medicine

## 2016-01-11 ENCOUNTER — Encounter: Payer: Self-pay | Admitting: Internal Medicine

## 2016-01-11 VITALS — BP 108/70 | HR 67 | Temp 98.1°F | Wt 241.4 lb

## 2016-01-11 DIAGNOSIS — N611 Abscess of the breast and nipple: Secondary | ICD-10-CM

## 2016-01-11 DIAGNOSIS — M545 Low back pain, unspecified: Secondary | ICD-10-CM

## 2016-01-11 DIAGNOSIS — Z720 Tobacco use: Secondary | ICD-10-CM

## 2016-01-11 DIAGNOSIS — F17210 Nicotine dependence, cigarettes, uncomplicated: Secondary | ICD-10-CM | POA: Diagnosis not present

## 2016-01-11 MED ORDER — CYCLOBENZAPRINE HCL 5 MG PO TABS: 5.0000 mg | ORAL_TABLET | Freq: Every evening | ORAL | Status: DC | PRN

## 2016-01-11 MED ORDER — NICOTINE 21 MG/24HR TD PT24: 21.0000 mg | MEDICATED_PATCH | Freq: Every day | TRANSDERMAL | Status: DC

## 2016-01-11 NOTE — Patient Instructions (Signed)
Thank you for your visit today.   Please return to the internal medicine clinic in 1 month(s) or sooner if needed.     I have made the following additions/changes to your medications:  I think you have a muscle strain in your back.  You may use flexeril at bedtime as needed. I will also send a prescription for a nicotine patch to your pharmacy. For your left breast, please continue warm compresses several times daily.  Please let us know if the area gets worse or you develop fever, chills, nausea, vomiting.    Please be sure to bring all of your medications with you to every visit; this includes herbal supplements, vitamins, eye drops, and any over-the-counter medications.   Should you have any questions regarding your medications and/or any new or worsening symptoms, please be sure to call the clinic at [phone].   If you believe that you are suffering from a life threatening condition or one that may result in the loss of limb or function, then you should call 911 and proceed to the nearest Emergency Department.   A healthy lifestyle and preventative care can promote health and wellness.   Maintain regular health, dental, and eye exams.  Eat a healthy diet. Foods like vegetables, fruits, whole grains, low-fat dairy products, and lean protein foods contain the nutrients you need without too many calories. Decrease your intake of foods high in solid fats, added sugars, and salt. Get information about a proper diet from your caregiver, if necessary.  Regular physical exercise is one of the most important things you can do for your health. Most adults should get at least 150 minutes of moderate-intensity exercise (any activity that increases your heart rate and causes you to sweat) each week. In addition, most adults need muscle-strengthening exercises on 2 or more days a week.   Maintain a healthy weight. The body mass index (BMI) is a screening tool to identify possible weight  problems. It provides an estimate of body fat based on height and weight. Your caregiver can help determine your BMI, and can help you achieve or maintain a healthy weight. For adults 20 years and older:  A BMI below 18.5 is considered underweight.  A BMI of 18.5 to 24.9 is normal.  A BMI of 25 to 29.9 is considered overweight.  A BMI of 30 and above is considered obese.

## 2016-01-11 NOTE — Progress Notes (Signed)
Patient ID: [name], female   DOB: Mar 29, 1971, 45 y.o.   MRN: [id number]     Subjective:   Patient ID: [name] female    DOB: Mar 29, 1971 45 y.o.    MRN: [id number] Health Maintenance Due: Health Maintenance Due  Topic Date Due  . PAP SMEAR  02/28/2014    _________________________________________________  HPI: [name] is a 45 y.o. female here for an acute visit for a breast lesion.  Also c/o LBP. Pt has a PMH outlined below.  Please see problem-based charting assessment and plan for further status of patient's chronic medical problems addressed at today's visit.  PMH: Past Medical History  Diagnosis Date  . Deep venous thrombosis of leg (HCC) 2012    completed year of coumadin   . Hx of measles   . Migraines   . Monilia infection 12/31/10  . Vaginal atrophy 03/04/11  . Dyspareunia 03/04/11  . Hypothyroidism     Hypothyroidism since 8th grade.  Managed by Dr. Sharl MaKerr, Endocrinology.  On synthroid.  No prior thyroid surgery/ablation.   . Bipolar 1 disorder (HCC)     Followed by Mental Health.  All psychiatric medications prescribed by Mental Health.  Stable for many years.      Medications: Current Outpatient Prescriptions on File Prior to Visit  Medication Sig Dispense Refill  . ARIPiprazole (ABILIFY) 20 MG tablet Take 1.5 tablets (30 mg total) by mouth at bedtime. For mood control 30 tablet 0  . buPROPion (WELLBUTRIN XL) 300 MG 24 hr tablet Take (one 300 mg tablet) along with (one 150 mg tablet) daily for depression 30 tablet 0  . busPIRone (BUSPAR) 30 MG tablet Take 30 mg by mouth daily.    Marland Kitchen. dextromethorphan 15 MG/5ML syrup Take 10 mLs (30 mg total) by mouth 4 (four) times daily as needed for cough. 120 mL 0  . diphenhydrAMINE (BENADRYL) 25 MG tablet Take 1 tablet (25 mg total) by mouth every 6 (six) hours. 12 tablet 0  . fluticasone (FLONASE) 50 MCG/ACT nasal spray Place 2 sprays into both nostrils daily. 16 g 2  . gabapentin (NEURONTIN) 300 MG  capsule TAKE 1 CAPSULE(300 MG) BY MOUTH THREE TIMES DAILY FOR NEUROPATHIC PAIN 270 capsule 0  . HYDROcodone-homatropine (HYCODAN) 5-1.5 MG/5ML syrup Take 5 mLs by mouth every 6 (six) hours as needed for cough. 120 mL 0  . hydrocortisone cream 1 % Apply to affected area 2 times daily 30 g 1  . levothyroxine (SYNTHROID) 100 MCG tablet Take 1 tablet (100 mcg total) by mouth daily. 30 tablet 11  . liothyronine (CYTOMEL) 5 MCG tablet Take 1 tablet (5 mcg total) by mouth daily. For hypothyroidism 90 tablet 1  . meclizine (ANTIVERT) 25 MG tablet TAKE 1 TABLET(25 MG) BY MOUTH TWICE DAILY AS NEEDED FOR DIZZINESS 60 tablet 0  . nystatin (MYCOSTATIN) powder Apply topically 3 (three) times daily. 15 g 0  . pantoprazole (PROTONIX) 40 MG tablet Take 1 tablet (40 mg total) by mouth daily. For reflux 90 tablet 3  . prochlorperazine (COMPAZINE) 10 MG tablet TAKE 1 TABLET(10 MG) BY MOUTH EVERY 6 HOURS AS NEEDED FOR MIGRAINE HEADACHE 30 tablet 0  . propranolol (INDERAL) 80 MG tablet TAKE 1 TABLET BY MOUTH THREE TIMES DAILY 90 tablet 5  . rizatriptan (MAXALT) 5 MG tablet Take 1 tablet (5 mg total) by mouth once as needed for migraine. May repeat in 2 hours if needed 30 tablet 2  . simvastatin (ZOCOR) 40 MG tablet Take  1 tablet (40 mg total) by mouth every morning. 30 tablet 11  . traZODone (DESYREL) 100 MG tablet Take 1 tablet (100 mg total) by mouth at bedtime. For sleep 30 tablet 5   No current facility-administered medications on file prior to visit.    Allergies: Allergies  Allergen Reactions  . Codeine Nausea And Vomiting  . Meloxicam Nausea Only  . Morphine And Related Other (See Comments)    Makes me loopy and hallucinates  . Naproxen Nausea Only and Rash  . Sulfonamide Derivatives Nausea Only and Rash    FH: Family History  Problem Relation Age of Onset  . Cancer Maternal Aunt   . Other Father     SH: Social History   Social History  . Marital Status: Legally Separated    Spouse Name: N/A   . Number of Children: N/A  . Years of Education: N/A   Social History Main Topics  . Smoking status: Current Every Day Smoker -- 1.00 packs/day    Types: Cigarettes    Last Attempt to Quit: 10/22/2012  . Smokeless tobacco: Never Used     Comment: 1PPD  . Alcohol Use: No  . Drug Use: No  . Sexual Activity: Not on file     Comment: hysterectomy   Other Topics Concern  . Not on file   Social History Narrative    Review of Systems: Constitutional: Negative for fever, +chills and neg weight loss.  Eyes: Negative for blurred vision.  Respiratory: Negative for cough and shortness of breath.  Cardiovascular: Negative for chest pain, palpitations and leg swelling.  Gastrointestinal: Negative for nausea, vomiting, abdominal pain, diarrhea, constipation and blood in stool.  Genitourinary: Negative for dysuria, urgency and frequency.  Musculoskeletal: Negative for myalgias and +back pain.  Neurological: Negative for dizziness, weakness and headaches.  Skin: +left breast lesion.     Objective:   Vital Signs: There were no vitals filed for this visit.    BP Readings from Last 3 Encounters:  12/29/15 102/61  11/12/15 102/55  10/15/15 111/78    Physical Exam: Constitutional: Vital signs reviewed.  Patient is in NAD and cooperative with exam.  Head: Normocephalic and atraumatic. Eyes: EOMI, conjunctivae nl, no scleral icterus.  Neck: Supple. Cardiovascular: RRR, no MRG. Pulmonary/Chest: normal effort, CTAB, no wheezes, rales, or rhonchi. Breast: Left lower outer quadrant exhibits ~1cm circular erythematous lesion with a small scab in the center.  No fluctuance, warmth, or drainage.  No peau d'orange.   Abdominal: Soft. Obese. NT/ND +BS. Musculoskeletal: Tenderness to palpation in left lumbar paraspinal area.  Patellar reflexes equal and intact.  LE strength intact.  Negative SLR b/l.   Neurological: A&O x3, cranial nerves II-XII are grossly intact, moving all  extremities. Extremities: 2+DP b/l; no pitting edema. Skin: Warm, dry and intact.    Assessment & Plan:   Assessment and plan was discussed and formulated with my attending.

## 2016-01-12 ENCOUNTER — Telehealth: Payer: Self-pay | Admitting: Pulmonary Disease

## 2016-01-12 NOTE — Telephone Encounter (Signed)
Pt is asking for a rx for pain medication states she has hurt her back. Says that she was seen yesterday by dr gill but would not give her anything for pain.

## 2016-01-12 NOTE — Telephone Encounter (Signed)
I would defer to Dr. Delane GingerGill who saw and evaluated her 01/11/2016. It looks like she ordered Flexeril. She may otherwise use tylenol, heating pad.

## 2016-01-13 DIAGNOSIS — M545 Low back pain, unspecified: Secondary | ICD-10-CM | POA: Insufficient documentation

## 2016-01-13 HISTORY — DX: Low back pain, unspecified: M54.50

## 2016-01-13 NOTE — Telephone Encounter (Signed)
[name] did not request any pain medication.  Furthermore, she has a muscle strain and as I instructed her to take tylenol and/or naproxen.  I also provided her with a short supply of flexeril (which she also requested).  She would not need a prescription for pain medication.  She may also try a heating pad on a low setting.  Thanks.

## 2016-01-13 NOTE — Assessment & Plan Note (Addendum)
A: Acute lower back strain: Pt reports picking up something from the floor over the weekend and felt a "pop" in her lower back.  She reports feeling that her legs are like "jelly" when I asked if she had weakness.  On exam, she has good ROM of her lumbar spine with tenderness to palpation of the right lumbar paraspinal area.  Neg SLR. Patellar reflexes intact b/l.  Normal LE strength.  SCr stable, normal GFR.     P: -imaging not needed for acute onset with normal neuro exam  -requested flexeril which I think is reasonable for a short course, advised her to not drive while taking this -advised to try tylenol and/or naproxen for pain

## 2016-01-13 NOTE — Telephone Encounter (Signed)
Pt has called and is very upset and wants a short course of tramadol. She states the pain is very bad in her back and the 7 flexeril is not helping Please advise She was given the message to use heat on low setting, tylenol/ naproxen, she states she has tried those things and its not working Please advise

## 2016-01-13 NOTE — Telephone Encounter (Signed)
I am unable to authorize a prescription for tramadol--this is not the standard of care for acute back pain.  If her back pain is that severe she may need to be seen again.  I would also want to know how often she is taking tylenol or naproxen?  We could possibly try a toradol injection.  Thanks.

## 2016-01-13 NOTE — Assessment & Plan Note (Addendum)
A: Left breast abscess: Pt reports a left breast lesion that came up on Saturday.  She reports having previous similar lesions.  She states that it "popped" and something greenish came out.  She reports using a warm compress that has helped.  She reports minimal pain without pruritis.  She denies fever, N/V. Does endorse chills.  She has about a 1cm erythematous area on her left lower outer quadrant of her left breast.  There is no fluctuance or warmth.  There is a small scab in the center of the erythematous area.  No history of MRSA. P: -continue warm compresses -does not need abx at this point given that the abscess appears to have drained spontaneously and she is not immunocompromised -advised to RTC if develops fever, worsening pain/swelling/erythema -she agrees with plan

## 2016-01-13 NOTE — Assessment & Plan Note (Signed)
A: Tobacco abuse: Pt reports smoking about 2 packs of cig/day and is requesting nicotine patch. P; -provided 21mg  nicotine patch

## 2016-01-14 NOTE — Telephone Encounter (Signed)
Message given to pt, she was ask twice about tylenol use and naproxen use, she never answered, she was offered an appt and refused, stating transportation issues, urg care and ED was offered, she states she will go to ED.

## 2016-01-14 NOTE — Telephone Encounter (Signed)
Thanks

## 2016-01-15 NOTE — Progress Notes (Signed)
[facility] Clinic Attending  Case discussed with Dr. Gill soon after the resident saw the patient.  We reviewed the resident's history and exam and pertinent patient test results.  I agree with the assessment, diagnosis, and plan of care documented in the resident's note.  

## 2016-01-16 ENCOUNTER — Telehealth: Payer: Self-pay | Admitting: Pulmonary Disease

## 2016-01-16 NOTE — Telephone Encounter (Signed)
   Reason for call:   I received a call from [name] at 6:24 PM indicating 2 more "bumps" on the left side of her L breast.   Pertinent Data:   Chills but this is baseline for her. No fevers. No nausea or vomiting.  The bumps are not draining. They are white with surrounding erythema.   Has tried to use warm compresses. Uses it every 3 every hours.  The abscess assessed by Dr. Delane GingerGill on 3/13 has worsened - feels that it is deeper.    Assessment / Plan / Recommendations:   I advised her to continue using the warm compresses. If she has drainage to put gauze on it to keep the drainage from affecting the other parts of her breast.  Will send a message to front desk so she can be seen in clinic soon for possible I&D +/- abx or consideration of referral for breast u/s and drainage.  As always, pt is advised that if symptoms worsen or new symptoms arise, they should go to an urgent care facility or to to ER for further evaluation.   Wendy PaulaJennifer T Krall, MD   01/16/2016, 6:18 PM

## 2016-01-18 ENCOUNTER — Telehealth: Payer: Self-pay | Admitting: Pulmonary Disease

## 2016-01-18 ENCOUNTER — Ambulatory Visit: Payer: Self-pay | Admitting: Internal Medicine

## 2016-01-18 NOTE — Telephone Encounter (Signed)
Called pt and offered assist, She wanted to know why she must keep coming in for new "places on my breast", she was advised each area is new and could be something totally different from the last or an underlying problem could be coming to light She wanted lisinopril refilled, this med had been stopped in mid 2016, i went back and read her the visit note information on why the med was stopped, she states she has continued to take the lisinopril and she wanted more She cancelled her appt coming up and said she would call back when she was ready

## 2016-01-18 NOTE — Telephone Encounter (Signed)
QUESTIONS ABOUT HER APPT TOMORROW

## 2016-01-19 ENCOUNTER — Ambulatory Visit: Payer: Self-pay | Admitting: Internal Medicine

## 2016-01-21 ENCOUNTER — Other Ambulatory Visit: Payer: Self-pay | Admitting: Internal Medicine

## 2016-01-23 ENCOUNTER — Other Ambulatory Visit: Payer: Self-pay | Admitting: Pulmonary Disease

## 2016-01-29 ENCOUNTER — Ambulatory Visit: Payer: Self-pay | Admitting: Internal Medicine

## 2016-01-31 ENCOUNTER — Other Ambulatory Visit: Payer: Self-pay | Admitting: Pulmonary Disease

## 2016-02-29 ENCOUNTER — Ambulatory Visit: Payer: Self-pay | Admitting: Internal Medicine

## 2016-03-01 ENCOUNTER — Other Ambulatory Visit: Payer: Self-pay

## 2016-03-01 NOTE — Telephone Encounter (Signed)
Pt requesting meclizine to be filled @ [facility] on [address].

## 2016-03-03 MED ORDER — MECLIZINE HCL 25 MG PO TABS: 25.0000 mg | ORAL_TABLET | Freq: Two times a day (BID) | ORAL | Status: DC

## 2016-03-21 ENCOUNTER — Encounter: Payer: Self-pay | Admitting: Internal Medicine

## 2016-03-21 ENCOUNTER — Ambulatory Visit (INDEPENDENT_AMBULATORY_CARE_PROVIDER_SITE_OTHER): Payer: Medicaid Other | Admitting: Internal Medicine

## 2016-03-21 VITALS — BP 116/74 | HR 65 | Temp 98.0°F | Ht 65.0 in | Wt 229.4 lb

## 2016-03-21 DIAGNOSIS — M47816 Spondylosis without myelopathy or radiculopathy, lumbar region: Secondary | ICD-10-CM | POA: Insufficient documentation

## 2016-03-21 DIAGNOSIS — M545 Low back pain: Secondary | ICD-10-CM

## 2016-03-21 MED ORDER — CYCLOBENZAPRINE HCL 5 MG PO TABS: 5.0000 mg | ORAL_TABLET | Freq: Three times a day (TID) | ORAL | Status: DC | PRN

## 2016-03-21 NOTE — Patient Instructions (Signed)
Please take Flexeril 1 tablet every 8 hours as needed. Try activity as much as you can tolerate.   We have referred you to physical therapy as well to help to prevent this down the road.

## 2016-03-21 NOTE — Progress Notes (Signed)
   Subjective:    Patient ID: [name], female    DOB: [birth year], 45 y.o.   MRN: [id number]  HPI [name] is a 45 year old female with morbid obesity, history of DVT, hypothyroidism who presents today for back pain. Please see assessment & plan for status of chronic medical problems.    Review of Systems  Genitourinary: Negative for difficulty urinating.  Musculoskeletal: Positive for back pain and gait problem.  Neurological: Negative for numbness.       Objective:   Physical Exam  Constitutional: She is oriented to person, place, and time. She appears well-developed and well-nourished.  HENT:  Head: Normocephalic and atraumatic.  Eyes: Conjunctivae are normal. No scleral icterus.  Musculoskeletal:  Tenderness noted with palpation of the left mid back. Warmth noted asymmetric to the contralateral side. Pain elicited with left hip flexion/extension. Minimal pain elicited with flexion, abduction, external rotation of left lower extremity. 5 out of 5 leg extension/flexion.  Neurological: She is alert and oriented to person, place, and time.          Assessment & Plan:

## 2016-03-21 NOTE — Assessment & Plan Note (Signed)
Overview 3 days ago, she noted the acute onset of left lower back pain when she was trying to get up from sitting on the couch. She is concerned that the "cement placed in her spine may have moved." Per review of the chart, she did indeed have kyphoplasty back in December 2014 for L1 compression fracture. Her pain is worse with standing/walking and is relieved while sitting. She has been taking extra strength Tylenol 2 pills twice daily over this interval without adequate relief in her pain. She denies any neurologic symptoms, like numbness, tingling, bowel/bladder dysfunction. She reports intolerance to NSAID therapy given prior history of ulcer.   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 Most recent imaging findings in January 2016 are also consistent as is her morbid obesity.  Plan -Offered trial of anti-inflammatory, like meloxicam, which she politely declined -Prescribed cyclobenzaprine 5 mg to be taken every hours as needed 15 tablets -Refer her to physical therapy once symptoms have been adequately controlled for long-term strengthening of her muscles -Encouraged her to consider nutrition and weight loss to also prevent future reoccurrences

## 2016-03-23 NOTE — Progress Notes (Signed)
Internal Medicine Clinic Attending  Case discussed with Dr. Patel,Rushil at the time of the visit.  We reviewed the resident's history and exam and pertinent patient test results.  I agree with the assessment, diagnosis, and plan of care documented in the resident's note.  

## 2016-04-02 ENCOUNTER — Other Ambulatory Visit: Payer: Self-pay | Admitting: Pulmonary Disease

## 2016-04-06 NOTE — Addendum Note (Signed)
Addended by: Neomia DearPOWERS, Mel Tadros E on: 04/06/2016 06:24 PM   Modules accepted: Orders

## 2016-04-14 ENCOUNTER — Encounter: Payer: Self-pay | Admitting: Pulmonary Disease

## 2016-04-18 ENCOUNTER — Ambulatory Visit (INDEPENDENT_AMBULATORY_CARE_PROVIDER_SITE_OTHER): Payer: Medicaid Other | Admitting: Internal Medicine

## 2016-04-18 ENCOUNTER — Encounter: Payer: Self-pay | Admitting: Internal Medicine

## 2016-04-18 VITALS — BP 114/74 | HR 57 | Temp 98.0°F | Ht 65.0 in | Wt 235.0 lb

## 2016-04-18 DIAGNOSIS — E039 Hypothyroidism, unspecified: Secondary | ICD-10-CM | POA: Diagnosis present

## 2016-04-18 NOTE — Patient Instructions (Signed)
1. STOP taking Cytomel.  We will recheck your symptoms in 6 weeks. 2. Continue Synthroid 100 mcg daily. 3. Return to clinic in 6 weeks.  Hypothyroidism Hypothyroidism is a disorder of the thyroid. The thyroid is a large gland that is located in the lower front of the neck. The thyroid releases hormones that control how the body works. With hypothyroidism, the thyroid does not make enough of these hormones. CAUSES Causes of hypothyroidism may include:  Viral infections.  Pregnancy.  Your own defense system (immune system) attacking your thyroid.  Certain medicines.  Birth defects.  Past radiation treatments to your head or neck.  Past treatment with radioactive iodine.  Past surgical removal of part or all of your thyroid.  Problems with the gland that is located in the center of your brain (pituitary). SIGNS AND SYMPTOMS Signs and symptoms of hypothyroidism may include:  Feeling as though you have no energy (lethargy).  Inability to tolerate cold.  Weight gain that is not explained by a change in diet or exercise habits.  Dry skin.  Coarse hair.  Menstrual irregularity.  Slowing of thought processes.  Constipation.  Sadness or depression. DIAGNOSIS  Your health care provider may diagnose hypothyroidism with blood tests and ultrasound tests. TREATMENT Hypothyroidism is treated with medicine that replaces the hormones that your body does not make. After you begin treatment, it may take several weeks for symptoms to go away. HOME CARE INSTRUCTIONS   Take medicines only as directed by your health care provider.  If you start taking any new medicines, tell your health care provider.  Keep all follow-up visits as directed by your health care provider. This is important. As your condition improves, your dosage needs may change. You will need to have blood tests regularly so that your health care provider can watch your condition. SEEK MEDICAL CARE IF:  Your  symptoms do not get better with treatment.  You are taking thyroid replacement medicine and:  You sweat excessively.  You have tremors.  You feel anxious.  You lose weight rapidly.  You cannot tolerate heat.  You have emotional swings.  You have diarrhea.  You feel weak. SEEK IMMEDIATE MEDICAL CARE IF:   You develop chest pain.  You develop an irregular heartbeat.  You develop a rapid heartbeat.   This information is not intended to replace advice given to you by your health care provider. Make sure you discuss any questions you have with your health care provider.   Document Released: 10/17/2005 Document Revised: 11/07/2014 Document Reviewed: 03/04/2014 Elsevier Interactive Patient Education [employer]2016 [employer] Inc.

## 2016-04-18 NOTE — Assessment & Plan Note (Signed)
Patient reports she has experienced weight gain (approximately 6 lbs over the last month) and hair thinning. She denies cold intolerance, but says she is usually hot. She denies constipation or diarrhea.  She is currently taking Synthroid 100 mcg daily and Liothyronine 5 mcg daily.  She endorses poor diet, eating lots of ice cream, cake, chocolate syrup, and "everything I know I shouldn't be eating."  She exercises minimally, but does walk about 0.5 miles daily.      A/P: Hypothyroidism.  The Cytomel was prescribed by her previous endocrinologist, but Epic shows it was last ordered March-May 2016.  It is unclear how she is still on it.  Her last TSH check was WNL and her symptoms are currently to incongruous to say that she is uncontrolled.  Therefore, will have patient STOP Cytomel, continue current Synthroid dose, and recheck symptoms and TSH in 6 weeks.  If patient's symptoms remain as they are now, I would recommend continuing Synthroid alone.  However, if her symptoms worsen and TSH WNL, then referral back to Endocrinology may be warranted for restarting of Cytomel. - Synthroid 100 mcg daily - STOP Cytomel - RTC 6 weeks for symptoms and TSH recheck

## 2016-04-18 NOTE — Progress Notes (Signed)
Patient ID: [name], female   DOB: 06-16-1971, 45 y.o.   MRN: [id number]   Subjective:   Patient ID: [name] female   DOB: [birth year] 45 y.o.   MRN: [id number]  HPI: [name] is a 45 y.o. female with PMH as below, here for f/u hypothyroidism.  Please see Problem-Based charting for the status of the patient's chronic medical issues.     Past Medical History  Diagnosis Date  . Deep venous thrombosis of leg (HCC) 2012    completed year of coumadin   . Hx of measles   . Migraines   . Monilia infection 12/31/10  . Vaginal atrophy 03/04/11  . Dyspareunia 03/04/11  . Hypothyroidism     Hypothyroidism since 8th grade.  Managed by Dr. Sharl Ma, Endocrinology.  On synthroid.  No prior thyroid surgery/ablation.   . Bipolar 1 disorder (HCC)     Followed by Mental Health.  All psychiatric medications prescribed by Mental Health.  Stable for many years.     Current Outpatient Prescriptions  Medication Sig Dispense Refill  . ARIPiprazole (ABILIFY) 20 MG tablet Take 1.5 tablets (30 mg total) by mouth at bedtime. For mood control 30 tablet 0  . buPROPion (WELLBUTRIN XL) 300 MG 24 hr tablet Take (one 300 mg tablet) along with (one 150 mg tablet) daily for depression 30 tablet 0  . busPIRone (BUSPAR) 30 MG tablet Take 30 mg by mouth daily.    . cyclobenzaprine (FLEXERIL) 5 MG tablet Take 1 tablet (5 mg total) by mouth every 8 (eight) hours as needed for muscle spasms. 15 tablet 0  . dextromethorphan 15 MG/5ML syrup Take 10 mLs (30 mg total) by mouth 4 (four) times daily as needed for cough. 120 mL 0  . diphenhydrAMINE (BENADRYL) 25 MG tablet Take 1 tablet (25 mg total) by mouth every 6 (six) hours. 12 tablet 0  . fluticasone (FLONASE) 50 MCG/ACT nasal spray Place 2 sprays into both nostrils daily. 16 g 2  . gabapentin (NEURONTIN) 300 MG capsule TAKE 1 CAPSULE(300 MG) BY MOUTH THREE TIMES DAILY FOR NEUROPATHIC PAIN 270 capsule 0  . HYDROcodone-homatropine (HYCODAN) 5-1.5 MG/5ML  syrup Take 5 mLs by mouth every 6 (six) hours as needed for cough. 120 mL 0  . hydrocortisone cream 1 % Apply to affected area 2 times daily 30 g 1  . levothyroxine (SYNTHROID) 100 MCG tablet Take 1 tablet (100 mcg total) by mouth daily. 30 tablet 11  . liothyronine (CYTOMEL) 5 MCG tablet Take 1 tablet (5 mcg total) by mouth daily. For hypothyroidism 90 tablet 1  . meclizine (ANTIVERT) 25 MG tablet TAKE 1 TABLET(25 MG) BY MOUTH TWICE DAILY 60 tablet 0  . nicotine (NICODERM CQ - DOSED IN MG/24 HOURS) 21 mg/24hr patch Place 1 patch (21 mg total) onto the skin daily. 28 patch 0  . nystatin (MYCOSTATIN) powder Apply topically 3 (three) times daily. 15 g 0  . pantoprazole (PROTONIX) 40 MG tablet Take 1 tablet (40 mg total) by mouth daily. For reflux 90 tablet 3  . prochlorperazine (COMPAZINE) 10 MG tablet TAKE 1 TABLET(10 MG) BY MOUTH EVERY 6 HOURS AS NEEDED FOR MIGRAINE HEADACHE 30 tablet 0  . propranolol (INDERAL) 80 MG tablet TAKE 1 TABLET BY MOUTH THREE TIMES DAILY 90 tablet 5  . rizatriptan (MAXALT) 5 MG tablet TAKE 1 TABLET(5 MG) BY MOUTH 1 TIME AS NEEDED FOR MIGRAINE. MAY REPEAT IN 2 HOURS AS NEEDED 30 tablet 0  . simvastatin (ZOCOR) 40 MG  tablet Take 1 tablet (40 mg total) by mouth every morning. 30 tablet 11  . traZODone (DESYREL) 100 MG tablet Take 1 tablet (100 mg total) by mouth at bedtime. For sleep 30 tablet 5   No current facility-administered medications for this visit.   Family History  Problem Relation Age of Onset  . Cancer Maternal Aunt   . Other Father    Social History   Social History  . Marital Status: Legally Separated    Spouse Name: N/A  . Number of Children: N/A  . Years of Education: N/A   Social History Main Topics  . Smoking status: Current Every Day Smoker -- 1.00 packs/day    Types: Cigarettes    Last Attempt to Quit: 10/22/2012  . Smokeless tobacco: Never Used     Comment: 1PPD  . Alcohol Use: No  . Drug Use: No  . Sexual Activity: Not Asked      Comment: hysterectomy   Other Topics Concern  . None   Social History Narrative   Review of Systems: Endorses weight gain, hair thinning, always feeling hot, and depressed mood. She denies cold intolerance, constipation, or diarrhea. Objective:  Physical Exam: Filed Vitals:   04/18/16 1356  BP: 114/74  Pulse: 57  Temp: 98 F (36.7 C)  TempSrc: Oral  Height: 5\' 5"  (1.651 m)  Weight: 235 lb (106.595 kg)  SpO2: 97%   Physical Exam  Constitutional: She is oriented to person, place, and time and well-developed, well-nourished, and in no distress. No distress.  HENT:  Head: Normocephalic and atraumatic.  Eyes: EOM are normal. No scleral icterus.  Neck: No tracheal deviation present.  Cardiovascular: Regular rhythm.   Borderline bradycardic. Heart sounds distant. No murmurs appreciated.  Pulmonary/Chest: Effort normal and breath sounds normal. No stridor. No respiratory distress. She has no wheezes.  Musculoskeletal:  Trace peripheral edema.  Neurological: She is alert and oriented to person, place, and time.  Skin: Skin is warm and dry. She is not diaphoretic.     Assessment & Plan:   Patient and case were discussed with Dr. Criselda PeachesMullen.  Please refer to Problem Based charting for further documentation.

## 2016-04-19 NOTE — Progress Notes (Signed)
Internal Medicine Clinic Attending  Case discussed with Dr. Taylor at the time of the visit.  We reviewed the resident's history and exam and pertinent patient test results.  I agree with the assessment, diagnosis, and plan of care documented in the resident's note. 

## 2016-04-25 ENCOUNTER — Telehealth: Payer: Self-pay | Admitting: Internal Medicine

## 2016-04-25 ENCOUNTER — Other Ambulatory Visit: Payer: Self-pay | Admitting: Internal Medicine

## 2016-04-25 ENCOUNTER — Other Ambulatory Visit: Payer: Self-pay

## 2016-04-25 DIAGNOSIS — M545 Low back pain, unspecified: Secondary | ICD-10-CM

## 2016-04-25 MED ORDER — GABAPENTIN 300 MG PO CAPS: ORAL_CAPSULE | ORAL | Status: DC

## 2016-04-25 NOTE — Telephone Encounter (Signed)
RX sent in by Dr. Danella Pentonruong  E-Prescribing Status: Receipt confirmed by pharmacy (04/25/2016 5:11 PM EDT)

## 2016-04-25 NOTE — Telephone Encounter (Signed)
Pt requesting refill of gabapentin 300mg  TID. Last seen in clinic on 6/19 and she has been waiting on refill since then.   - will send in refill for gabapentin 300mg  TID  Gara Kronerruong, Diana, MD Internal Medicine Resident, PGY [facility] Pager: [phone]

## 2016-04-25 NOTE — Addendum Note (Signed)
Addended by: Neomia DearPOWERS, Aerielle Stoklosa E on: 04/25/2016 03:10 PM   Modules accepted: Orders

## 2016-04-25 NOTE — Telephone Encounter (Signed)
Pt states gabapentin is not at the pharmacy. Please call pt back.

## 2016-05-04 ENCOUNTER — Other Ambulatory Visit: Payer: Self-pay

## 2016-05-04 ENCOUNTER — Other Ambulatory Visit: Payer: Self-pay | Admitting: Pulmonary Disease

## 2016-05-04 ENCOUNTER — Other Ambulatory Visit: Payer: Self-pay | Admitting: Internal Medicine

## 2016-05-04 NOTE — Telephone Encounter (Signed)
Started 03/01/2016 for low back pain

## 2016-05-04 NOTE — Telephone Encounter (Signed)
Sent to PCP for refill

## 2016-05-04 NOTE — Telephone Encounter (Signed)
Requesting Meclizine and flexeril to be filled.

## 2016-05-05 MED ORDER — MECLIZINE HCL 25 MG PO TABS: 25.0000 mg | ORAL_TABLET | Freq: Two times a day (BID) | ORAL | Status: DC | PRN

## 2016-05-05 NOTE — Telephone Encounter (Signed)
[facility] for [facility] pain and refill of flexeril. Patient denied fever. Advised patient to go to ED if fever develops.

## 2016-05-06 ENCOUNTER — Encounter: Payer: Self-pay | Admitting: Internal Medicine

## 2016-05-06 ENCOUNTER — Ambulatory Visit (INDEPENDENT_AMBULATORY_CARE_PROVIDER_SITE_OTHER): Payer: Medicaid Other | Admitting: Internal Medicine

## 2016-05-06 VITALS — BP 116/78 | HR 57 | Temp 98.4°F | Ht 65.0 in | Wt 235.5 lb

## 2016-05-06 DIAGNOSIS — F319 Bipolar disorder, unspecified: Secondary | ICD-10-CM

## 2016-05-06 DIAGNOSIS — F430 Acute stress reaction: Secondary | ICD-10-CM

## 2016-05-06 DIAGNOSIS — F418 Other specified anxiety disorders: Secondary | ICD-10-CM | POA: Insufficient documentation

## 2016-05-06 DIAGNOSIS — M62838 Other muscle spasm: Secondary | ICD-10-CM | POA: Diagnosis not present

## 2016-05-06 MED ORDER — DIAZEPAM 5 MG PO TABS: 5.0000 mg | ORAL_TABLET | Freq: Three times a day (TID) | ORAL | Status: DC | PRN

## 2016-05-06 NOTE — Progress Notes (Signed)
Patient ID: [name], female   DOB: May 27, 1971, 45 y.o.   MRN: [id number]   CC: feeling anxious and neck pain  HPI:  [name] is a [age] y.o. woman with PMHx as detailed below who is presenting today with neck pain and anxiety.  Neck pain began 2 days ago.  She reports feeling a "pulse in back of my neck" that radiates to the right side of her neck.  No radiating symptoms down her arm or associated weakness.  She describes a throbbing sensation in her paraspinal muscles and feels her muscles are very tense.  The pain is aggravated by her anxiety and she believes this is the source of her pain.  She has obtained partial relief with Aleve and no relief with Tylenol.  The pain is constant, but slightly worse at bedtime.  It is a 7/10 in intensity.  Her anxiety is a chronic condition that she is on Buspar and Trazadone for.  She follows with [facility] for her mental health needs related to this and her Bipolar Disorder.  Her anxiety has worsened after she spent last week with her mom, stepdad, and son.  She came home and now feels afraid to be alone.  Asked her mom if she could spend the night with them and her mom said she had plans.  This type of anxiety is new for her.  Usually, she worries about her mom or step dad being hurt in an accident.  She says she feels safe at home and denies SI/HI.  She is hoping I will call her mother to discuss having her stay the night.  Past Medical History  Diagnosis Date  . Deep venous thrombosis of leg (HCC) 2012    completed year of coumadin   . Hx of measles   . Migraines   . Monilia infection 12/31/10  . Vaginal atrophy 03/04/11  . Dyspareunia 03/04/11  . Hypothyroidism     Hypothyroidism since 8th grade.  Managed by Dr. Sharl MaKerr, Endocrinology.  On synthroid.  No prior thyroid surgery/ablation.   . Bipolar 1 disorder (HCC)     Followed by [facility] Health.  All psychiatric medications prescribed by Mental Health.  Stable for many years.      Review  of Systems:   Review of Systems  Constitutional: Negative for fever and chills.  Eyes: Negative for blurred vision.  Musculoskeletal: Positive for neck pain.  Neurological: Negative for tingling, sensory change and headaches.  Psychiatric/Behavioral: Negative for suicidal ideas. The patient is nervous/anxious and has insomnia.      Physical Exam:  Filed Vitals:   05/06/16 1336  BP: 116/78  Pulse: 57  Temp: 98.4 F (36.9 C)  TempSrc: Oral  Height: 5\' 5"  (1.651 m)  Weight: 235 lb 8 oz (106.822 kg)  SpO2: 98%   Physical Exam  Constitutional:  Pleasant, middle-age female, mildly anxious  HENT:  Head: Normocephalic and atraumatic.  Cardiovascular: Normal rate, regular rhythm and normal heart sounds.   Pulmonary/Chest: Effort normal.  Musculoskeletal:  She has decreased range of motion with neck rotation, tenderness and spasm of right cervical paraspinal muscles. Upper extremity strength 5/5 proximally and distally.  Skin: Skin is warm and dry.     Assessment & Plan:   See encounters tab for problem based medical decision making.   Patient discussed with Dr. Cleda DaubE. Hoffman   Cervical paraspinal muscle spasm A: She appears to have a spasm of her cervical paraspinal muscles brought on by her acute worsening  of anxiety.  In the past she has been treated with Flexeril and Tramadol.  She is asking today for Flexeril to treat her symptoms.  P: - will provide Valium 5mg  #12 tablets to treat both her situational anxiety as well as muscle spasm - RTC 1-2 weeks if not better - follow up with [facility] on Tuesday of next week  Situational anxiety A: Patient presenting with acute situational anxiety about being alone after spending a week with family.  She reports feeling safe at home, denies SI/HI.  I called her mother and spoke to her.  She confirmed they have plans tonight and are unable to have her spend the night.  She said this is not uncommon for her daughter to behave this  way.  P: - Valium 5mg  TID as needed #12 tablets to treat acute anxiety and muscle spasm - follow up with [facility] on Tuesday as scheduled. - RTC as needed for anxiety and muscle spasm

## 2016-05-06 NOTE — Assessment & Plan Note (Signed)
A: Patient presenting with acute situational anxiety about being alone after spending a week with family.  She reports feeling safe at home, denies SI/HI.  I called her mother and spoke to her.  She confirmed they have plans tonight and are unable to have her spend the night.  She said this is not uncommon for her daughter to behave this way.  P: - Valium 5mg  TID as needed #12 tablets to treat acute anxiety and muscle spasm - follow up with [facility] on Tuesday as scheduled. - RTC as needed for anxiety and muscle spasm

## 2016-05-06 NOTE — Assessment & Plan Note (Signed)
A: She appears to have a spasm of her cervical paraspinal muscles brought on by her acute worsening of anxiety.  In the past she has been treated with Flexeril and Tramadol.  She is asking today for Flexeril to treat her symptoms.  P: - will provide Valium 5mg  #12 tablets to treat both her situational anxiety as well as muscle spasm - RTC 1-2 weeks if not better - follow up with [facility] on Tuesday of next week

## 2016-05-06 NOTE — Progress Notes (Signed)
Internal Medicine Clinic Attending  Case discussed with Dr. Earlene PlaterWallace soon after the resident saw the patient.  We reviewed the resident's history and exam and pertinent patient test results.  I agree with the assessment, diagnosis, and plan of care documented in the resident's note.   Of note Depression seems situational, given recent changes to thyroid medication will check TSH at next visit but it is too soon to check today.

## 2016-05-06 NOTE — Patient Instructions (Signed)
Thank you for coming to see me today. It was a pleasure. Today we talked about:   Anxiety and Neck Pain: I want to give you a short course of Valium to treat both your neck pain and anxiety.  Take as directed over the next few days and be sure to follow up with your Psychiatrist.   Please follow-up with us in 1-2 weeks if symptoms not better.  If you have any questions or concerns, please do not hesitate to call the office at [phone].  Take Care,   Gwynn BurlyAndrew Stephone Gum, DO

## 2016-05-07 ENCOUNTER — Other Ambulatory Visit: Payer: Self-pay | Admitting: Internal Medicine

## 2016-05-08 ENCOUNTER — Other Ambulatory Visit: Payer: Self-pay | Admitting: Pulmonary Disease

## 2016-05-09 NOTE — Telephone Encounter (Deleted)
Needs to have TSH rechecked. See office note from visit with Dr. Ladona Ridgelaylor.

## 2016-05-14 ENCOUNTER — Telehealth: Payer: Self-pay | Admitting: Internal Medicine

## 2016-05-14 NOTE — Telephone Encounter (Signed)
   Reason for call:   I received a call from Ms. Liston AlbaMelanie B Mesina at 10:30  PM indicating she was having a migraine headache.   Pertinent Data:   Patient stated having migraine since early this afternoon.  Felt like her typical migraine but did not improve with 1 time dose of compazine and 2 doses of rizatriptan.  States she has not tried any other options to abate her headache.  She denies any current nausea or vomiting.   She reports some photophobia.   Assessment / Plan / Recommendations:   Recommended trying either naproxen or ibuprofen with Tylenol.  Recommended lying in quiet, dark room with minimal distraction.  Recommended Benadryl as well if needed.  Recommended she could repeat dosing of Compazine as needed Q6H.  As always, pt is advised that if symptoms worsen or new symptoms arise, they should go to an urgent care facility or to to ER for further evaluation.   Gwynn BurlyAndrew Ramiyah Mcclenahan, DO   05/14/2016, 10:27 PM

## 2016-05-23 ENCOUNTER — Telehealth: Payer: Self-pay

## 2016-05-23 NOTE — Telephone Encounter (Signed)
Pt is called back and she states she is not coming for an appt but she wants a referral for a diag mammogram because of the lesions on her breasts, she states they started on one breast and now are on both breasts, she states she has been here numerous times for this problem or at least 2 or 3 and no one does anything, states her mother told her that there is something serious going on and it needs to be checked Please advise

## 2016-05-23 NOTE — Telephone Encounter (Signed)
Requesting to speak with a nurse regarding bump on breast.

## 2016-05-25 ENCOUNTER — Ambulatory Visit: Payer: Self-pay

## 2016-05-25 NOTE — Telephone Encounter (Signed)
Noted that she had a visit for breast abscess. She will need breast ultrasound but cannot order unless she comes in so we can see where the lesions are on her breasts - especially if there are new lesions.

## 2016-05-26 ENCOUNTER — Encounter: Payer: Self-pay | Admitting: Pulmonary Disease

## 2016-05-27 ENCOUNTER — Ambulatory Visit (INDEPENDENT_AMBULATORY_CARE_PROVIDER_SITE_OTHER): Payer: Medicaid Other | Admitting: Internal Medicine

## 2016-05-27 ENCOUNTER — Encounter (INDEPENDENT_AMBULATORY_CARE_PROVIDER_SITE_OTHER): Payer: Self-pay

## 2016-05-27 VITALS — BP 108/73 | HR 71 | Temp 98.1°F | Wt 237.2 lb

## 2016-05-27 DIAGNOSIS — N6002 Solitary cyst of left breast: Secondary | ICD-10-CM

## 2016-05-27 DIAGNOSIS — N6489 Other specified disorders of breast: Secondary | ICD-10-CM | POA: Diagnosis not present

## 2016-05-27 DIAGNOSIS — N6009 Solitary cyst of unspecified breast: Secondary | ICD-10-CM | POA: Insufficient documentation

## 2016-05-27 MED ORDER — PROPRANOLOL HCL 80 MG PO TABS: 80.0000 mg | ORAL_TABLET | Freq: Three times a day (TID) | ORAL | 5 refills | Status: DC

## 2016-05-27 NOTE — Telephone Encounter (Signed)
Spoke with patient who has an appointment today.

## 2016-05-27 NOTE — Assessment & Plan Note (Signed)
A: Patient with 2 small, mildly erythematous, non-tender breast lesions for the past week. About 1 x 1 cm in size. No obvious fluctuance, discharge, induration, or underlying mass or suspicious findings. Suspect this is a small skin lesion suchas a boil rather than mastitis, or significant breast abscess. Appears to be healing well on its own at this time. No systemic symptoms. No nipple discharge. No lymphadenopathy. Previous mammogram in 2015 normal.  She states she started getting mammograms at age 59-26 on a yearly basis due to maternal aunt having breast cancer which seems odd but she has never had an abnormal mammogram per her report.  I do not find any other records of previous mammograms.    P: - low suspicion for any sort of infectious etiology at this point to warrant treatment. - recommended daily breast hygiene. - she is anxious that something is wrong and would like her biennial mammogram.  [facility] that she needed an order for diagnostic mammogram.  I went ahead and ordered this for her as she is about due for her mammogram at this point.

## 2016-05-27 NOTE — Patient Instructions (Signed)
Thank you for coming to see me today. It was a pleasure. Today we talked about:   Breast Cyst: - I have ordered a diagnostic mammogram for you. - To help prevent these from re-occurring, please practice good breast hygiene by keeping the area clean and washing daily with soap and water.  Please follow-up with your primary doctor in 4 weeks or earlier if needed.  If you have any questions or concerns, please do not hesitate to call the office at [phone].  Take Care,   Gwynn Burly, DO

## 2016-05-27 NOTE — Progress Notes (Signed)
CC: cyst-like lesions on left breast  HPI:  [name] is a 45 y.o. woman with PMHx as below presenting today for complaint of a "cyst" on her left breast.  States that since March, these lesions will come and go.  Mostly on left breast but occasionally on right as well.  Currently, she states she has 3 on her left breast.  Describes as a cyst-like pimple that is dark red.  Most recent ones came up about a week ago.  She states they drain "greenish" fluid and occasionally bleed.  They are painful described as stabbing and aggravated by touch and pressure like from her bra.  She denies any nausea or vomiting.  No fever or chills.  No drainage or crusting from her nipple.  She cannot recall what makes the lesions better.  Please see A&P for status of medical conditions addressed today.  Past Medical History:  Diagnosis Date  . Bipolar 1 disorder (HCC)    Followed by [facility].  All psychiatric medications prescribed by [facility].  Stable for many years.    . Deep venous thrombosis of leg (HCC) 2012   completed year of coumadin   . Dyspareunia 03/04/11  . Hx of measles   . Hypothyroidism    Hypothyroidism since 8th grade.  Managed by Dr. Sharl Ma, Endocrinology.  On synthroid.  No prior thyroid surgery/ablation.   . Migraines   . Monilia infection 12/31/10  . Vaginal atrophy 03/04/11    Review of Systems:   As per HPI  Physical Exam:  Vitals:   05/27/16 1339  BP: 108/73  Pulse: 71  Temp: 98.1 F (36.7 C)  TempSrc: Oral  Weight: 237 lb 3.2 oz (107.6 kg)   Physical Exam  Constitutional: She is well-developed, well-nourished, and in no distress.  HENT:  Head: Normocephalic and atraumatic.  Pulmonary/Chest: Effort normal.  Skin:  Large breasts with heterogenous breast tissue. No axillary lymphadenopathy bilaterally. Left breast with 2 small, erythematous lesions in the upper outer quadrant, but somewhat close to the nipple. Not tender to palpation, 1 x 1 cm in size, no  induration or fluctuance. Appears to be healing and scabbed. No drainage or discharge. No underlying lump, mass, or abscess. No nipple discharge. Rest of breast exam benign.  Psychiatric: Mood and affect normal.     Assessment & Plan:   See Encounters Tab for problem based charting.   Patient discussed with Dr. Cyndie Chime   Breast cyst A: Patient with 2 small, mildly erythematous, non-tender breast lesions for the past week. About 1 x 1 cm in size. No obvious fluctuance, discharge, induration, or underlying mass or suspicious findings. Suspect this is a small skin lesion suchas a boil rather than mastitis, or significant breast abscess. Appears to be healing well on its own at this time. No systemic symptoms. No nipple discharge. No lymphadenopathy. Previous mammogram in 2015 normal.  She states she started getting mammograms at age [age] on a yearly basis due to maternal aunt having breast cancer which seems odd but she has never had an abnormal mammogram per her report.  I do not find any other records of previous mammograms.    P: - low suspicion for any sort of infectious etiology at this point to warrant treatment. - recommended daily breast hygiene. - she is anxious that something is wrong and would like her biennial mammogram.  [facility] told her that she needed an order for diagnostic mammogram.  I  went ahead and ordered this for her as she is about due for her mammogram at this point.

## 2016-05-30 NOTE — Progress Notes (Signed)
Medicine attending: Medical history, presenting problems, physical findings, and medications, reviewed with resident physician Dr Andrew Wallace on the day of the patient visit and I concur with his evaluation and management plan. 

## 2016-06-01 ENCOUNTER — Ambulatory Visit: Payer: Self-pay

## 2016-06-01 NOTE — Addendum Note (Signed)
Addended by: Maura Crandall on: 06/01/2016 01:32 PM   Modules accepted: Orders

## 2016-06-05 ENCOUNTER — Other Ambulatory Visit: Payer: Self-pay | Admitting: Pulmonary Disease

## 2016-06-07 ENCOUNTER — Telehealth: Payer: Self-pay | Admitting: Internal Medicine

## 2016-06-07 DIAGNOSIS — R42 Dizziness and giddiness: Secondary | ICD-10-CM

## 2016-06-07 MED ORDER — MECLIZINE HCL 25 MG PO TABS: ORAL_TABLET | ORAL | 3 refills | Status: DC

## 2016-06-07 NOTE — Telephone Encounter (Signed)
   Reason for call:   I received a call from [name] at 7:05 PM indicating she needs a medication refill.   Pertinent Data:   Reports she called a few days ago and that her Meclizine has not been refilled yet  She takes Meclizine for vertigo   Assessment / Plan / Recommendations:   I refilled the patient's medication. Appears she has not been evaluated for vertigo in over a year. This should be addressed at the next visit.    [name] Rivet, MD   06/07/2016, 7:04 PM

## 2016-06-09 ENCOUNTER — Ambulatory Visit
Admission: RE | Admit: 2016-06-09 | Discharge: 2016-06-09 | Disposition: A | Discharge status: Home / Self Care | Payer: Medicaid Other | Source: Ambulatory Visit | Attending: Oncology | Admitting: Oncology

## 2016-06-09 DIAGNOSIS — N6002 Solitary cyst of left breast: Secondary | ICD-10-CM

## 2016-06-14 ENCOUNTER — Emergency Department (HOSPITAL_COMMUNITY): Payer: Medicaid Other

## 2016-06-14 ENCOUNTER — Inpatient Hospital Stay (HOSPITAL_COMMUNITY)
Admission: EM | Admit: 2016-06-14 | Discharge: 2016-06-16 | DRG: 313 | Disposition: A | Discharge status: Home / Self Care | Payer: Medicaid Other | Attending: Internal Medicine | Admitting: Internal Medicine

## 2016-06-14 ENCOUNTER — Encounter (HOSPITAL_COMMUNITY): Payer: Self-pay

## 2016-06-14 DIAGNOSIS — I951 Orthostatic hypotension: Secondary | ICD-10-CM | POA: Diagnosis present

## 2016-06-14 DIAGNOSIS — Z86718 Personal history of other venous thrombosis and embolism: Secondary | ICD-10-CM

## 2016-06-14 DIAGNOSIS — Z720 Tobacco use: Secondary | ICD-10-CM | POA: Diagnosis present

## 2016-06-14 DIAGNOSIS — Z79899 Other long term (current) drug therapy: Secondary | ICD-10-CM

## 2016-06-14 DIAGNOSIS — R079 Chest pain, unspecified: Secondary | ICD-10-CM

## 2016-06-14 DIAGNOSIS — F319 Bipolar disorder, unspecified: Secondary | ICD-10-CM | POA: Diagnosis present

## 2016-06-14 DIAGNOSIS — Z882 Allergy status to sulfonamides status: Secondary | ICD-10-CM

## 2016-06-14 DIAGNOSIS — E7800 Pure hypercholesterolemia, unspecified: Secondary | ICD-10-CM | POA: Diagnosis present

## 2016-06-14 DIAGNOSIS — R55 Syncope and collapse: Secondary | ICD-10-CM

## 2016-06-14 DIAGNOSIS — I10 Essential (primary) hypertension: Secondary | ICD-10-CM | POA: Diagnosis present

## 2016-06-14 DIAGNOSIS — E039 Hypothyroidism, unspecified: Secondary | ICD-10-CM | POA: Diagnosis present

## 2016-06-14 DIAGNOSIS — Z885 Allergy status to narcotic agent status: Secondary | ICD-10-CM

## 2016-06-14 DIAGNOSIS — Z888 Allergy status to other drugs, medicaments and biological substances status: Secondary | ICD-10-CM

## 2016-06-14 DIAGNOSIS — R0789 Other chest pain: Secondary | ICD-10-CM | POA: Diagnosis present

## 2016-06-14 DIAGNOSIS — G43809 Other migraine, not intractable, without status migrainosus: Secondary | ICD-10-CM

## 2016-06-14 DIAGNOSIS — E785 Hyperlipidemia, unspecified: Secondary | ICD-10-CM | POA: Diagnosis present

## 2016-06-14 DIAGNOSIS — F3130 Bipolar disorder, current episode depressed, mild or moderate severity, unspecified: Secondary | ICD-10-CM | POA: Diagnosis present

## 2016-06-14 DIAGNOSIS — R197 Diarrhea, unspecified: Secondary | ICD-10-CM | POA: Diagnosis present

## 2016-06-14 DIAGNOSIS — K219 Gastro-esophageal reflux disease without esophagitis: Secondary | ICD-10-CM | POA: Diagnosis present

## 2016-06-14 DIAGNOSIS — R42 Dizziness and giddiness: Secondary | ICD-10-CM

## 2016-06-14 DIAGNOSIS — H8110 Benign paroxysmal vertigo, unspecified ear: Secondary | ICD-10-CM

## 2016-06-14 LAB — BASIC METABOLIC PANEL
Anion gap: 10 (ref 5–15)
BUN: 11 mg/dL (ref 6–20)
CO2: 24 mmol/L (ref 22–32)
CREATININE: 1.16 mg/dL — AB (ref 0.44–1.00)
Calcium: 9.2 mg/dL (ref 8.9–10.3)
Chloride: 106 mmol/L (ref 101–111)
GFR calc non Af Amer: 56 mL/min — ABNORMAL LOW (ref >60)
Glucose, Bld: 120 mg/dL — ABNORMAL HIGH (ref 65–99)
Potassium: 3.6 mmol/L (ref 3.5–5.1)
SODIUM: 140 mmol/L (ref 135–145)

## 2016-06-14 LAB — CBC
HEMATOCRIT: 40.6 % (ref 36.0–46.0)
Hemoglobin: 13.8 g/dL (ref 12.0–15.0)
MCH: 29.8 pg (ref 26.0–34.0)
MCHC: 34 g/dL (ref 30.0–36.0)
MCV: 87.7 fL (ref 78.0–100.0)
PLATELETS: 133 10*3/uL — AB (ref 150–400)
RBC: 4.63 MIL/uL (ref 3.87–5.11)
RDW: 13.5 % (ref 11.5–15.5)
WBC: 10.5 10*3/uL (ref 4.0–10.5)

## 2016-06-14 LAB — CBG MONITORING, ED: Glucose-Capillary: 115 mg/dL — ABNORMAL HIGH (ref 65–99)

## 2016-06-14 LAB — I-STAT TROPONIN, ED: Troponin i, poc: 0 ng/mL (ref 0.00–0.08)

## 2016-06-14 MED ORDER — ASPIRIN 81 MG PO CHEW
324.0000 mg | CHEWABLE_TABLET | Freq: Once | ORAL | Status: AC
  Administered 2016-06-15: 324 mg via ORAL
  Filled 2016-06-14: qty 4

## 2016-06-14 MED ORDER — DIAZEPAM 5 MG/ML IJ SOLN
2.5000 mg | Freq: Once | INTRAMUSCULAR | Status: AC
  Administered 2016-06-14: 2.5 mg via INTRAVENOUS
  Filled 2016-06-14: qty 2

## 2016-06-14 MED ORDER — KETOROLAC TROMETHAMINE 30 MG/ML IJ SOLN
30.0000 mg | Freq: Once | INTRAMUSCULAR | Status: AC
  Administered 2016-06-14: 30 mg via INTRAVENOUS
  Filled 2016-06-14: qty 1

## 2016-06-14 MED ORDER — SODIUM CHLORIDE 0.9 % IV BOLUS (SEPSIS)
1000.0000 mL | Freq: Once | INTRAVENOUS | Status: AC
  Administered 2016-06-14 – 2016-06-15 (×2): 1000 mL via INTRAVENOUS

## 2016-06-14 MED ORDER — FENTANYL CITRATE (PF) 100 MCG/2ML IJ SOLN
50.0000 ug | Freq: Once | INTRAMUSCULAR | Status: AC
  Administered 2016-06-15: 50 ug via INTRAVENOUS
  Filled 2016-06-14: qty 2

## 2016-06-14 MED ORDER — ONDANSETRON HCL 4 MG/2ML IJ SOLN
4.0000 mg | Freq: Once | INTRAMUSCULAR | Status: AC
  Administered 2016-06-14: 4 mg via INTRAVENOUS
  Filled 2016-06-14: qty 2

## 2016-06-14 NOTE — ED Provider Notes (Signed)
By signing my name below, I, Emmanuella Mensah, attest that this documentation has been prepared under the direction and in the presence of Boyd Litaker N Nikoleta Dady, DO. Electronically Signed: Angelene GiovanniEmmanuella Mensah, ED Scribe. 06/14/16. 11:21 PM.   TIME SEEN: 11:13 PM   CHIEF COMPLAINT: Dizziness   HPI:  [name] is a 45 y.o. female with a hx of Vertigo, Hypothyroidism, hypercholesterolemia, Hypertension, and migraines brought in by ambulance, who presents to the Emergency Department complaining of gradually worsening intermittent episodes of dizziness that she describes as the room spinning onset 9 pm tonight. Pt reports associated ongoing generalized headache that started in the ED, pressure/tightness substernal chest pain, and shortness of breath. She adds that standing up and ambulating makes her chest pain worse and when she stands up very quickly, that makes her dizziness worse. She notes that her headache is consistent with her hx of migraines and is diffuse and throbbing. She also reports that she has been having multiple episodes of non-bloody diarrhea for approx one week ago and nausea for 2 days.  No vomiting.  No fever.  No abdominal pain. No alleviating factors noted. Pt reports that she has tried OTC Imodium for the diarrhea with no relief. She also states that she takes Meclizine twice a day and although she was complaint with the medication, they have provided no relief for these symptoms. She reports that she is a current smoker. Pt had a heart cath 12 years ago and denies any stents. No fever, blood in stool, melena, vaginal bleeding, hematemesis.   ROS: See HPI Constitutional: no fever  Eyes: no drainage  ENT: no runny nose   Cardiovascular:  chest pain  Resp: SOB  GI: no vomiting GU: no dysuria Integumentary: no rash  Allergy: no hives  Musculoskeletal: no leg swelling  Neurological: no slurred speech ROS otherwise negative  PAST MEDICAL HISTORY/PAST SURGICAL HISTORY:  Past  Medical History:  Diagnosis Date  . Bipolar 1 disorder (HCC)    Followed by Mental Health.  All psychiatric medications prescribed by Mental Health.  Stable for many years.    . Deep venous thrombosis of leg (HCC) 2012   completed year of coumadin   . Dyspareunia 03/04/11  . Hx of measles   . Hypothyroidism    Hypothyroidism since 8th grade.  Managed by Dr. Sharl MaKerr, Endocrinology.  On synthroid.  No prior thyroid surgery/ablation.   . Migraines   . Monilia infection 12/31/10  . Vaginal atrophy 03/04/11    MEDICATIONS:  Prior to Admission medications   Medication Sig Start Date End Date Taking? Authorizing Provider  ARIPiprazole (ABILIFY) 30 MG tablet Take 30 mg by mouth at bedtime. 05/20/16  Yes Historical Provider, MD  buPROPion (WELLBUTRIN XL) 150 MG 24 hr tablet Take 150 mg by mouth every morning.  05/21/16  Yes Historical Provider, MD  busPIRone (BUSPAR) 10 MG tablet Take 10 mg by mouth 4 (four) times daily. 05/10/16  Yes Historical Provider, MD  gabapentin (NEURONTIN) 300 MG capsule TAKE 1 CAPSULE(300 MG) BY MOUTH THREE TIMES DAILY FOR NEUROPATHIC PAIN 04/25/16  Yes Denton Brickiana M Truong, MD  levothyroxine (SYNTHROID, LEVOTHROID) 100 MCG tablet TAKE 1 TABLET(100 MCG) BY MOUTH DAILY 05/09/16  Yes [name], MD  liothyronine (CYTOMEL) 5 MCG tablet Take 5 mcg by mouth daily. 05/07/16  Yes Historical Provider, MD  meclizine (ANTIVERT) 25 MG tablet TAKE 1 TABLET(25 MG) BY MOUTH TWICE DAILY AS NEEDED FOR DIZZINESS Patient taking differently: Take 25 mg by mouth 2 (two) times  daily.  06/07/16  Yes Carly Arlyce HarmanJ Rivet, MD  pantoprazole (PROTONIX) 40 MG tablet Take 1 tablet (40 mg total) by mouth daily. For reflux 12/23/15  Yes Nischal Narendra, MD  prochlorperazine (COMPAZINE) 10 MG tablet TAKE 1 TABLET(10 MG) BY MOUTH EVERY 6 HOURS AS NEEDED FOR MIGRAINE HEADACHE 05/09/16  Yes [name], MD  propranolol (INDERAL) 80 MG tablet Take 1 tablet (80 mg total) by mouth 3 (three) times daily. 05/27/16  Yes Gwynn BurlyAndrew  Wallace, DO  rizatriptan (MAXALT) 5 MG tablet TAKE 1 TABLET(5 MG) BY MOUTH 1 TIME AS NEEDED FOR MIGRAINE. MAY REPEAT IN 2 HOURS AS NEEDED 05/09/16  Yes [name], MD  simvastatin (ZOCOR) 40 MG tablet Take 1 tablet (40 mg total) by mouth every morning. 12/23/15  Yes Nischal Narendra, MD  traZODone (DESYREL) 100 MG tablet Take 1 tablet (100 mg total) by mouth at bedtime. For sleep 12/31/14  Yes Shuvon B Rankin, NP    ALLERGIES:  Allergies  Allergen Reactions  . Codeine Nausea And Vomiting and Rash  . Morphine Shortness Of Breath  . Sulfonamide Derivatives Shortness Of Breath, Nausea Only and Rash  . Meloxicam Nausea Only  . Morphine And Related Other (See Comments)    Makes me loopy and hallucinates  . Naproxen Nausea Only and Rash    SOCIAL HISTORY:  Social History  Substance Use Topics  . Smoking status: Current Every Day Smoker    Packs/day: 1.00    Types: Cigarettes    Last attempt to quit: 10/22/2012  . Smokeless tobacco: Never Used     Comment: 1PPD  . Alcohol use No    FAMILY HISTORY: Family History  Problem Relation Age of Onset  . Other Father   . Cancer Maternal Aunt     EXAM: BP 116/83   Pulse 90   Temp 98.4 F (36.9 C)   Resp 18   Ht 5\' 5"  (1.651 m)   Wt 236 lb (107 kg)   SpO2 93%   BMI 39.27 kg/m  CONSTITUTIONAL: Alert and oriented and responds appropriately to questions. Obese, appears well-hydrated, appears uncomfortable HEAD: Normocephalic EYES: Conjunctivae clear, PERRL, extraocular movements intact ENT: normal nose; no rhinorrhea; moist mucous membranes NECK: Supple, no meningismus, no LAD  CARD: RRR; S1 and S2 appreciated; no murmurs, no clicks, no rubs, no gallops RESP: Normal chest excursion without splinting or tachypnea; breath sounds clear and equal bilaterally; no wheezes, no rhonchi, no rales, no hypoxia or respiratory distress, speaking full sentences ABD/GI: Normal bowel sounds; non-distended; soft, non-tender, no rebound, no  guarding, no peritoneal signs BACK:  The back appears normal and is non-tender to palpation, there is no CVA tenderness EXT: Normal ROM in all joints; non-tender to palpation; no edema; normal capillary refill; no cyanosis, no calf tenderness or swelling    SKIN: Normal color for age and race; warm; no rash NEURO: Moves all extremities equally, sensation to light touch intact diffusely, cranial nerves II through XII intact. No dysmetria with finger to nose testing bilaterally; no pronator drift PSYCH: The patient's mood and manner are appropriate. Grooming and personal hygiene are appropriate.  MEDICAL DECISION MAKING: Patient here with multiple different complaints. She is complaining of vertigo which seems to be a chronic issue for her and she is on meclizine twice daily. This has not resolved any of her symptoms. Does have a component of vertigo changing with standing upright. We'll check orthostatic vital signs and then give IV fluids. Will treat her symptoms with  IV Valium. She states vertigo is something that is chronic for her. Also complaining of chest pressure and shortness of breath and feeling what she was going to pass out tonight when walking to the front door to into the door for her neighbor. Still having chest pressure currently. Does have multiple risk factors for ACS including hypertension, hyperlipidemia, tobacco use, obesity. Has never had a stress test and last cardiac catheterization she states was 12 years ago. I feel she will need admission for ACS rule out.  No h/o PE, DVT.  Will avoid nitroglycerin at this time given there may be component of orthostasis to her dizziness and this will also worsen her headache. We'll give aspirin for cardioprotection. EKG shows T-wave changes in anterior lateral leads that are similar compared to EKG in 2001 but did resolve and EKG in 2015. We'll obtain cardiac labs, chest x-ray. A patient's headache, she states this is similar to her prior migraines.  We'll give her Toradol, Zofran and reassess. She has no neurologic deficits on exam, meningismus, fever. I do not think she has subarachnoid hemorrhage or other intracranial hemorrhage, meningitis, encephalitis, CVA. I do not feel she needs emergent imaging of her head.  ED PROGRESS: Patient's blood pressure does drop 15 mm Hg systolic with standing upright but no change in heart rate. Receiving IV fluids. Has received aspirin and fentanyl for her chest pain. Troponin negative. Chest x-ray clear. We'll discuss with medicine for admission. She states she sees [facility] internal medicine, Dr. Isabella Bowens.   12:15 AM  She reports her headache and vertigo have improved significantly but she still having chest pain. We'll give her nitroglycerin and reassess.   12:20 AM  D/w Dr. Beckie Salts with IM teaching service.  They will see patient in the emergency department for admission and place her own admission orders. Care has been transferred to the internal medicine service. Patient has been updated with this plan.   I reviewed all nursing notes, vitals, pertinent old records, EKGs, labs, imaging (as available).     EKG Interpretation  Date/Time:  Tuesday June 14 2016 22:24:23 EDT Ventricular Rate:  80 PR Interval:    QRS Duration: 121 QT Interval:  408 QTC Calculation: 471 R Axis:   30 Text Interpretation:  Sinus rhythm IVCD, consider atypical RBBB Anteroseptal infarct, age indeterminate T wave inversion in anterolateral leads returned since 2015 but similar to EKG in 2001 Confirmed by Aubryana Vittorio,  DO, Sonora Catlin [phone]) on 06/14/2016 11:06:29 PM       I personally performed the services described in this documentation, which was scribed in my presence. The recorded information has been reviewed and is accurate.    Layla Maw Erna Brossard, DO 06/15/16 0021

## 2016-06-14 NOTE — ED Triage Notes (Signed)
Pt comes from home via [facility], c/o dizziness , nausea and vertigo starting about 2100. Pt has hx of vertigo, feels like she may have syncopal episode.

## 2016-06-15 ENCOUNTER — Other Ambulatory Visit: Payer: Self-pay

## 2016-06-15 DIAGNOSIS — I951 Orthostatic hypotension: Secondary | ICD-10-CM

## 2016-06-15 DIAGNOSIS — F319 Bipolar disorder, unspecified: Secondary | ICD-10-CM

## 2016-06-15 DIAGNOSIS — R0789 Other chest pain: Secondary | ICD-10-CM | POA: Diagnosis present

## 2016-06-15 DIAGNOSIS — E039 Hypothyroidism, unspecified: Secondary | ICD-10-CM

## 2016-06-15 DIAGNOSIS — K219 Gastro-esophageal reflux disease without esophagitis: Secondary | ICD-10-CM | POA: Diagnosis not present

## 2016-06-15 DIAGNOSIS — R42 Dizziness and giddiness: Secondary | ICD-10-CM | POA: Diagnosis present

## 2016-06-15 DIAGNOSIS — Z79899 Other long term (current) drug therapy: Secondary | ICD-10-CM

## 2016-06-15 DIAGNOSIS — Z882 Allergy status to sulfonamides status: Secondary | ICD-10-CM | POA: Diagnosis not present

## 2016-06-15 DIAGNOSIS — R079 Chest pain, unspecified: Secondary | ICD-10-CM | POA: Diagnosis not present

## 2016-06-15 DIAGNOSIS — E785 Hyperlipidemia, unspecified: Secondary | ICD-10-CM | POA: Diagnosis not present

## 2016-06-15 DIAGNOSIS — Z885 Allergy status to narcotic agent status: Secondary | ICD-10-CM | POA: Diagnosis not present

## 2016-06-15 DIAGNOSIS — I10 Essential (primary) hypertension: Secondary | ICD-10-CM | POA: Diagnosis not present

## 2016-06-15 DIAGNOSIS — Z86718 Personal history of other venous thrombosis and embolism: Secondary | ICD-10-CM | POA: Diagnosis not present

## 2016-06-15 DIAGNOSIS — R197 Diarrhea, unspecified: Secondary | ICD-10-CM | POA: Diagnosis not present

## 2016-06-15 DIAGNOSIS — Z888 Allergy status to other drugs, medicaments and biological substances status: Secondary | ICD-10-CM | POA: Diagnosis not present

## 2016-06-15 LAB — URINALYSIS, ROUTINE W REFLEX MICROSCOPIC
Glucose, UA: NEGATIVE mg/dL
Hgb urine dipstick: NEGATIVE
KETONES UR: NEGATIVE mg/dL
NITRITE: NEGATIVE
PROTEIN: NEGATIVE mg/dL
SPECIFIC GRAVITY, URINE: 1.022 (ref 1.005–1.030)
pH: 6 (ref 5.0–8.0)

## 2016-06-15 LAB — BASIC METABOLIC PANEL
Anion gap: 4 — ABNORMAL LOW (ref 5–15)
BUN: 11 mg/dL (ref 6–20)
CHLORIDE: 111 mmol/L (ref 101–111)
CO2: 25 mmol/L (ref 22–32)
CREATININE: 1.09 mg/dL — AB (ref 0.44–1.00)
Calcium: 8.4 mg/dL — ABNORMAL LOW (ref 8.9–10.3)
GFR calc Af Amer: >60 mL/min (ref >60)
GFR calc non Af Amer: 60 mL/min — ABNORMAL LOW (ref >60)
GLUCOSE: 85 mg/dL (ref 65–99)
Potassium: 4.4 mmol/L (ref 3.5–5.1)
SODIUM: 140 mmol/L (ref 135–145)

## 2016-06-15 LAB — GLUCOSE, CAPILLARY: Glucose-Capillary: 123 mg/dL — ABNORMAL HIGH (ref 65–99)

## 2016-06-15 LAB — URINE MICROSCOPIC-ADD ON

## 2016-06-15 LAB — MRSA PCR SCREENING: MRSA BY PCR: NEGATIVE

## 2016-06-15 LAB — TROPONIN I
Troponin I: <0.03 ng/mL (ref <0.03)
Troponin I: 0.03 ng/mL (ref <0.03)

## 2016-06-15 MED ORDER — GABAPENTIN 300 MG PO CAPS
300.0000 mg | ORAL_CAPSULE | Freq: Three times a day (TID) | ORAL | Status: DC
  Administered 2016-06-15 – 2016-06-16 (×4): 300 mg via ORAL
  Filled 2016-06-15 (×5): qty 1

## 2016-06-15 MED ORDER — ACETAMINOPHEN 325 MG PO TABS
650.0000 mg | ORAL_TABLET | Freq: Four times a day (QID) | ORAL | Status: DC | PRN
  Administered 2016-06-15: 650 mg via ORAL
  Filled 2016-06-15 (×2): qty 2

## 2016-06-15 MED ORDER — NITROGLYCERIN 0.4 MG SL SUBL
0.4000 mg | SUBLINGUAL_TABLET | SUBLINGUAL | Status: AC | PRN
  Administered 2016-06-15 (×3): 0.4 mg via SUBLINGUAL
  Filled 2016-06-15: qty 1

## 2016-06-15 MED ORDER — GI COCKTAIL ~~LOC~~
30.0000 mL | Freq: Three times a day (TID) | ORAL | Status: DC | PRN
  Administered 2016-06-15: 30 mL via ORAL
  Filled 2016-06-15: qty 30

## 2016-06-15 MED ORDER — SODIUM CHLORIDE 0.9 % IV SOLN
INTRAVENOUS | Status: DC
  Administered 2016-06-15 (×3): via INTRAVENOUS

## 2016-06-15 MED ORDER — ARIPIPRAZOLE 10 MG PO TABS
30.0000 mg | ORAL_TABLET | Freq: Every day | ORAL | Status: DC
  Administered 2016-06-15: 30 mg via ORAL
  Filled 2016-06-15 (×2): qty 3

## 2016-06-15 MED ORDER — DICLOFENAC SODIUM 1 % TD GEL
2.0000 g | Freq: Four times a day (QID) | TRANSDERMAL | Status: DC
  Administered 2016-06-15 – 2016-06-16 (×3): 2 g via TOPICAL
  Filled 2016-06-15: qty 100

## 2016-06-15 MED ORDER — BUSPIRONE HCL 10 MG PO TABS
10.0000 mg | ORAL_TABLET | Freq: Four times a day (QID) | ORAL | Status: DC
  Administered 2016-06-15 – 2016-06-16 (×5): 10 mg via ORAL
  Filled 2016-06-15 (×8): qty 1

## 2016-06-15 MED ORDER — BUPROPION HCL ER (XL) 150 MG PO TB24: 150.0000 mg | ORAL_TABLET | ORAL | Status: DC

## 2016-06-15 MED ORDER — LEVOTHYROXINE SODIUM 100 MCG PO TABS
100.0000 ug | ORAL_TABLET | Freq: Every day | ORAL | Status: DC
  Administered 2016-06-15 – 2016-06-16 (×2): 100 ug via ORAL
  Filled 2016-06-15 (×2): qty 1

## 2016-06-15 MED ORDER — BUPROPION HCL ER (XL) 150 MG PO TB24
150.0000 mg | ORAL_TABLET | Freq: Every day | ORAL | Status: DC
  Administered 2016-06-15 – 2016-06-16 (×2): 150 mg via ORAL
  Filled 2016-06-15 (×2): qty 1

## 2016-06-15 MED ORDER — PANTOPRAZOLE SODIUM 40 MG PO TBEC
40.0000 mg | DELAYED_RELEASE_TABLET | Freq: Every day | ORAL | Status: DC
  Administered 2016-06-15 – 2016-06-16 (×2): 40 mg via ORAL
  Filled 2016-06-15 (×2): qty 1

## 2016-06-15 MED ORDER — PROPRANOLOL HCL 80 MG PO TABS
80.0000 mg | ORAL_TABLET | Freq: Three times a day (TID) | ORAL | Status: DC
  Administered 2016-06-15 – 2016-06-16 (×4): 80 mg via ORAL
  Filled 2016-06-15 (×4): qty 1

## 2016-06-15 MED ORDER — SIMETHICONE 80 MG PO CHEW
80.0000 mg | CHEWABLE_TABLET | Freq: Once | ORAL | Status: AC
  Administered 2016-06-15: 80 mg via ORAL
  Filled 2016-06-15: qty 1

## 2016-06-15 MED ORDER — LIOTHYRONINE SODIUM 5 MCG PO TABS
5.0000 ug | ORAL_TABLET | Freq: Every day | ORAL | Status: DC
  Administered 2016-06-15 – 2016-06-16 (×2): 5 ug via ORAL
  Filled 2016-06-15 (×2): qty 1

## 2016-06-15 MED ORDER — SODIUM CHLORIDE 0.9% FLUSH
3.0000 mL | Freq: Two times a day (BID) | INTRAVENOUS | Status: DC
  Administered 2016-06-15: 3 mL via INTRAVENOUS

## 2016-06-15 MED ORDER — ACETAMINOPHEN 650 MG RE SUPP: 650.0000 mg | Freq: Four times a day (QID) | RECTAL | Status: DC | PRN

## 2016-06-15 MED ORDER — MECLIZINE HCL 25 MG PO TABS
25.0000 mg | ORAL_TABLET | Freq: Two times a day (BID) | ORAL | Status: DC
  Administered 2016-06-15 – 2016-06-16 (×3): 25 mg via ORAL
  Filled 2016-06-15 (×4): qty 1

## 2016-06-15 MED ORDER — SIMVASTATIN 40 MG PO TABS
40.0000 mg | ORAL_TABLET | Freq: Every morning | ORAL | Status: DC
  Administered 2016-06-15 – 2016-06-16 (×2): 40 mg via ORAL
  Filled 2016-06-15 (×2): qty 1

## 2016-06-15 MED ORDER — GI COCKTAIL ~~LOC~~
30.0000 mL | Freq: Once | ORAL | Status: AC
Start: 2016-06-15 — End: 2016-06-15
  Administered 2016-06-15: 30 mL via ORAL
  Filled 2016-06-15: qty 30

## 2016-06-15 MED ORDER — ENOXAPARIN SODIUM 40 MG/0.4ML ~~LOC~~ SOLN: 40.0000 mg | SUBCUTANEOUS | Status: DC

## 2016-06-15 MED ORDER — PROCHLORPERAZINE MALEATE 10 MG PO TABS
10.0000 mg | ORAL_TABLET | Freq: Four times a day (QID) | ORAL | Status: DC | PRN
  Administered 2016-06-15 (×2): 10 mg via ORAL
  Filled 2016-06-15 (×3): qty 1

## 2016-06-15 MED ORDER — TRAZODONE HCL 100 MG PO TABS
100.0000 mg | ORAL_TABLET | Freq: Every day | ORAL | Status: DC
  Administered 2016-06-15: 100 mg via ORAL
  Filled 2016-06-15: qty 1

## 2016-06-15 MED ORDER — ENOXAPARIN SODIUM 40 MG/0.4ML ~~LOC~~ SOLN
40.0000 mg | SUBCUTANEOUS | Status: DC
Start: 2016-06-15 — End: 2016-06-16
  Administered 2016-06-15: 40 mg via SUBCUTANEOUS
  Filled 2016-06-15 (×2): qty 0.4

## 2016-06-15 NOTE — ED Notes (Signed)
Admitting at bedside 

## 2016-06-15 NOTE — Care Management Note (Signed)
Case Management Note  Patient Details  Name: Liston AlbaMelanie B Alesi MRN: [id number] Date of Birth: [birth year]  Subjective/Objective:  Patient is from home alone, pta indep.  PCP is Griffin BasilJennifer Krall, has [employer] [place]. Presents with chest pain, await pt eval. NCM will cont to follow for dc needs.                   Action/Plan:   Expected Discharge Date:  06/16/16               Expected Discharge Plan:  Home/Self Care  In-House Referral:     Discharge planning Services  CM Consult  Post Acute Care Choice:    Choice offered to:     DME Arranged:    DME Agency:     HH Arranged:    HH Agency:     Status of Service:  In process, will continue to follow  If discussed at Long Length of Stay Meetings, dates discussed:    Additional Comments:  Leone Havenaylor, Devere Brem Clinton, RN 06/15/2016, 12:33 PM

## 2016-06-15 NOTE — H&P (Signed)
Date: 06/15/2016               Patient Name:  [name] Sublette MRN: [id number]  DOB: 1971-08-02 Age / Sex: 45 y.o., female   PCP: [name], MD         Medical Service: Internal Medicine Teaching Service         Attending Physician: Dr. Earl LagosNischal Narendra, MD    First Contact: Dr. Nyra MarketGorica Svalina, MD Pager: [phone]  Second Contact: Dr. Hyacinth Meekerasrif Ahmed, MD Pager: [phone]       After Hours (After 5p/  First Contact Pager: [phone]  weekends / holidays): Second Contact Pager: [phone]   Chief Complaint: dizziness, chest pain.  History of Present Illness:  This is a 45 y/o F with MHx significant for vertigo, GERD, HTN, HLD, hypothyroidism and tobacco abuse who presented to ED via EMS for evaluation of multiple complaints. Patient reports she came to the ED because at around 9pm, when standing up, she experienced dizziness that felt like "the room spinning." She reports she was so dizzy that she felt like she was going to collapse. She also reports nausea and a headache that began shortly after. The patient admits dizziness similar to her previous episodes of vertigo, which she notes are rare, however she reports taking meclizine daily.  She also complains of 3-4 days of non-radiating chest pain, described as dull tightness, rated 6/10 in intensity. When asked to demonstrate where the pain is, she points to her epigastric region. She notes associated shortness of breath when her chest pain began but has subsided at this time. Pt reports she has never had chest pain before however states she had an "in office" cardiac cath performed in 2012 with Dr. Clarene DukeLittle. She however cannot remember the results.   Pt reports 4 watery stools over the past 4 days as well. Patient denies any fevers, chills, vomiting, abdominal pain, leg swelling, dysuria or blood in stool  In the ED, vital signs were stable. Orthostatic evaluation was positive and she was given a 1L bolus of IV ns. ASA, Fentanyl, Toradol,  Valium and Zofran were also administered in the ED with improvement of her HA and dizziness. She now complains of only mild dizziness when standing. Patient reports some improvement of her CP and rates it 5/10. GI cocktail ordered in ED administrated after examination.  Meds:  Current Meds  Medication Sig  . ARIPiprazole (ABILIFY) 30 MG tablet Take 30 mg by mouth at bedtime.  Marland Kitchen. buPROPion (WELLBUTRIN XL) 150 MG 24 hr tablet Take 150 mg by mouth every morning.   . busPIRone (BUSPAR) 10 MG tablet Take 10 mg by mouth 4 (four) times daily.  Marland Kitchen. gabapentin (NEURONTIN) 300 MG capsule TAKE 1 CAPSULE(300 MG) BY MOUTH THREE TIMES DAILY FOR NEUROPATHIC PAIN  . levothyroxine (SYNTHROID, LEVOTHROID) 100 MCG tablet TAKE 1 TABLET(100 MCG) BY MOUTH DAILY  . liothyronine (CYTOMEL) 5 MCG tablet Take 5 mcg by mouth daily.  . meclizine (ANTIVERT) 25 MG tablet TAKE 1 TABLET(25 MG) BY MOUTH TWICE DAILY AS NEEDED FOR DIZZINESS (Patient taking differently: Take 25 mg by mouth 2 (two) times daily. )  . pantoprazole (PROTONIX) 40 MG tablet Take 1 tablet (40 mg total) by mouth daily. For reflux  . prochlorperazine (COMPAZINE) 10 MG tablet TAKE 1 TABLET(10 MG) BY MOUTH EVERY 6 HOURS AS NEEDED FOR MIGRAINE HEADACHE  . propranolol (INDERAL) 80 MG tablet Take 1 tablet (80 mg total) by mouth 3 (three) times daily.  .Marland Kitchen  rizatriptan (MAXALT) 5 MG tablet TAKE 1 TABLET(5 MG) BY MOUTH 1 TIME AS NEEDED FOR MIGRAINE. MAY REPEAT IN 2 HOURS AS NEEDED  . simvastatin (ZOCOR) 40 MG tablet Take 1 tablet (40 mg total) by mouth every morning.  . traZODone (DESYREL) 100 MG tablet Take 1 tablet (100 mg total) by mouth at bedtime. For sleep    Allergies: Allergies as of 06/14/2016 - Review Complete 06/14/2016  Allergen Reaction Noted  . Codeine Nausea And Vomiting and Rash   . Morphine Shortness Of Breath   . Sulfonamide derivatives Shortness Of Breath, Nausea Only, and Rash 01/17/2007  . Meloxicam Nausea Only 10/09/2012  . Morphine and  related Other (See Comments) 11/22/2011  . Naproxen Nausea Only and Rash    Past Medical History:  Diagnosis Date  . Bipolar 1 disorder (HCC)    Followed by Mental Health.  All psychiatric medications prescribed by Mental Health.  Stable for many years.    . Deep venous thrombosis of leg (HCC) 2012   completed year of coumadin   . Dyspareunia 03/04/11  . Hx of measles   . Hypothyroidism    Hypothyroidism since 8th grade.  Managed by Dr. Sharl Ma, Endocrinology.  On synthroid.  No prior thyroid surgery/ablation.   . Migraines   . Monilia infection 12/31/10  . Vaginal atrophy 03/04/11    Family History: Mother: HTN Denies any other FHx  Social History: Tobacco: 1 pk/day x 30 years Alcohol: denies Drug use: denies  Review of Systems: A complete ROS was negative except as per HPI.   Physical Exam: Blood pressure 113/71, pulse 78, temperature 98.4 F (36.9 C), resp. rate 13, height 5\' 5"  (1.651 m), weight 107 kg (236 lb), SpO2 93 %. General: Alert, obese female laying comfortably in bed. In no acute distress. Responds appropriately to questions.  HENT: Normocephalic and atraumatic. PERLL.  Cardiovascular: Regular rate and rhythm, without murmur, gallop or rub. Chest pain reproducible to palpation of epigastrium. Pulmonary: CTA BL, without wheezing or rales. Breathing unlabored, pt speaking in full sentences. Abdominal: Tenderness to palpation of epigastric region. Non-distended, not rigid. No guarding.  Extremities: Without swelling. No calf tenderness or LE erythema.  EKG:   EKG Interpretation  Date/Time:  Tuesday June 14 2016 22:24:23 EDT Ventricular Rate:  80 PR Interval:    QRS Duration: 121 QT Interval:  408 QTC Calculation: 471 R Axis:   30 Text Interpretation:  Sinus rhythm IVCD, consider atypical RBBB Anteroseptal infarct, age indeterminate T wave inversion in anterolateral leads returned since 2015 but similar to EKG in 2001 Confirmed by WARD,  DO, KRISTEN [phone]) on  06/14/2016 11:06:29 PM      CXR: No active cardiopulmonary disease.  Assessment & Plan by Problem: Principal Problem:   Chest pain Active Problems:   Hypothyroidism   HYPERCHOLESTEROLEMIA, PURE   Bipolar I disorder, most recent episode depressed (HCC)   GERD   History of DVT of lower extremity   Tobacco abuse   Vertigo   HTN (hypertension)  1. Orthostatic Hypotension Patients orthostats positive in ED. Bolus of 1 L ns given in ED and IVF continued at 125 mL/hr. Patients symptoms have improved significantly since fluid administration. She does report a history of orthostatic hypotension and vertigo, controlled with meclizine BID. On examination, the patient does not appear dry however she admits to several episodes of watery diarrhea over the past 4-5 days.   2. Chest pain Pt has 2-3 days of non-radiating chest tightness, rated 6/10, which was associated  with SOB described as "taking her breath away."  EKG demonstrates T-wave inversions in anterolateral leads which were present in EKG from 2001 however were not present in EKG from 2015.  CXR without acute process. Troponin negative x1. Trending. Repeat EKG ordered for AM. On re-evaluation, patients pain was completely resolved with administration of GI cocktail, indicating a likely non-cardiac cause of her chest tightness.   3. GERD Patient has long-standing history of GERD and takes Protonix at home. Continued.  4. Vertigo Patient reports long history of Vertigo with infrequent exacerbations. She reports taking Meclizine BID daily with rare breakthroughs. Continued  5. HTN Stable at this time  6. Hypothyroidism Home Synthroid 100mcg and Cytomel continued  7. Bipolar disorder Patient reports good control of symptoms with her current regimen and sees psych as an outpatient. Abilify, Wellbutrin, Buspar continued.   8. Hyperlipidemia Zocor continued.   IVF: NS @ 15225ml/hr Diet: HH DVT Prophylaxis: Heparin SQ Code:  Full  Dispo: Admit patient to Observation with expected length of stay less than 2 midnights.  SignedNoemi Chapel: Dmauri Rosenow, DO 06/15/2016, 12:48 AM  Pager: [phone]

## 2016-06-15 NOTE — Progress Notes (Signed)
   Subjective: Patient complains of continued chest pain that is dull in nature, nonradiating, without symptoms of shortness of breath or palpitations. She does endorse mild nausea but has been able to eat and drink without problems.   Objective:  Vital signs in last 24 hours: Vitals:   06/15/16 1200 06/15/16 1300 06/15/16 1400 06/15/16 1430  BP: 114/72 124/76 132/80 (!) 145/84  Pulse: (!) 52 75 (!) 56 72  Resp: 15 (!) 25 20 19   Temp:    98 F (36.7 C)  TempSrc:    Oral  SpO2: 96% 97% 95% 95%  Weight:      Height:       Constitutional: NAD CV: RRR, no murmurs, rubs or gallops Chest: tender to palpation bilaterally in parasternal regions L>R Abd: soft, nontender, nondistended, +BS Ext: 2+ pulses throughout, no edema  Assessment/Plan:  Principal Problem:   Chest pain Active Problems:   Hypothyroidism   HYPERCHOLESTEROLEMIA, PURE   Bipolar I disorder, most recent episode depressed (HCC)   GERD   History of DVT of lower extremity   Tobacco abuse   Vertigo   HTN (hypertension)  Orthostatic Hypotension Patients orthostats positive in ED. Bolus of 1 L ns given in ED and IVF continued at 125 mL/hr. Patients symptoms have improved significantly since fluid administration. She does report a history of orthostatic hypotension and vertigo, controlled with meclizine BID. Pt has had several episodes of diarrhea in the past 4-5 days prior to admission, which likely accounts for current symptoms.  --repeat orthostatics --if stable pos d/c tomorrow  --PT consulted  Chest pain Pt has 2-3 days of non-radiating chest tightness, rated 6/10, which was associated with SOB described as "taking her breath away." EKG demonstrates T-wave inversions in anterolateral leads which were present in EKG from 2001 however were not present in EKG from 2015. CXR without acute process. Shortly after admission, pt's pain was resolved with GI cocktail. Today pt states pain has resumed, is dull, nonradiating,  without shortness of breath and without relief with GI cocktail. Pain is reproducible in parasternal regions L>R. Will try Voltaren gel as pain is likely musculoskeletal in nature. Troponins: neg, neg, 0.03.  --on tele --Voltaren gel --monitor chest pain  --plan for outpatient stress test (was Dr. Fredirick MaudlinLittle's pt)  GERD Patient has long-standing history of GERD and takes Protonix at home which was continued.  Vertigo Patient reports long history of Vertigo with infrequent exacerbations. She reports taking Meclizine BID daily with rare breakthroughs. Continued  HTN Stable at this time.  Hypothyroidism Home Synthroid 100mcg and Cytomel which were continued.   Bipolar disorder Patient reports good control of symptoms with her current regimen and sees psych as an outpatient. Treated with Abilify, Wellbutrin, and Buspar which were continued.   Hyperlipidemia Patient on management with Simvastatin 40mg , which was continued.  Dispo: Anticipated discharge in approximately 1 day.   Nyra MarketGorica Taevion Sikora, MD 06/15/2016, 3:31 PM Pager: [phone]

## 2016-06-15 NOTE — Evaluation (Addendum)
Physical Therapy Evaluation/Vestibular Assessment Patient Details Name: Wendy AlbaMelanie B Chase MRN: [id number] DOB: Feb 02, 1971 Today's Date: 06/15/2016   History of Present Illness  45 y.o. female [facility] for dizzinessand chest pain with associated SOB. Medical and cardiac workup pending.  EKG showed T-wave inversions which were present on EKG in 2001, but not 2015.  orthostatic vitals were (+) in the ED.  Pt with significant PMhx of migraines, DVT, bipolar disorder, and R TKA.    Clinical Impression  Pt with (+) symptoms(spinning) with horizontal canal testing, but without actual nystagmus.  It is difficult to prescribe treatment without specific nystagmus especially with both right and left horizontal canal testing was positive with no one being worse than the other.  She stated that she had meclizine recently, but in fact, she last received it at 10 am, so it should not be affecting her symptoms at this point in the day.  She would benefit from an ENT referral as an OP to f/u on her vertigo episodes, and OP PT for further vertigo testing, if she qualifies, but her medicaid may not cover it.  I do plan to come to see her prior to d/c in AM for further testing and to assess her OOB mobility.  Orthostatic vitals were negative (see vitals flow sheet for details).   PT to follow acutely for deficits listed below.        Follow Up Recommendations Outpatient PT;Other (comment) (vestibular PT, if she qualifies)    Equipment Recommendations  None recommended by PT    Recommendations for Other Services   NA    Precautions / Restrictions Precautions Precautions: Fall Precaution Comments: due to dizziness in standing.       Mobility  Bed Mobility Overal bed mobility: Modified Independent                Transfers Overall transfer level: Needs assistance Equipment used: None;4-wheeled walker (bed rail) Transfers: Sit to/from Stand Sit to Stand: Supervision          General transfer comment: supervision for safety to stand at EOB.  Pt relying heavily on bed rail for support in standing due to reports of vertig, but able to stand 3 mins to get orthostatic vitals and without LE buckling noted.   Ambulation/Gait             General Gait Details: NT today, will need to assess in AM prior to d/c         Balance Overall balance assessment: Needs assistance Sitting-balance support: Feet supported;Bilateral upper extremity supported Sitting balance-Leahy Scale: Good     Standing balance support: Single extremity supported Standing balance-Leahy Scale: Poor           06/15/16 1819  Vestibular Assessment  General Observation Eye alignement WNL, pt sees better without her glasses, going to get her eyes checked next month and has not had them checked in years.   Pt does report some sinus drainage last night.  None currently.    Symptom Behavior  Type of Dizziness Spinning ("aura" and associated most of the time with a migraine)  Frequency of Dizziness with position changes (rolling, supine to sit, sit to stand, sit to supine).   Duration of Dizziness < 1 min  Aggravating Factors Supine to sit;Sit to stand;Rolling to right;Rolling to left;Activity in general  Relieving Factors Closing eyes;Rest;Lying supine  Occulomotor Exam  Occulomotor Alignment Normal  Spontaneous Absent  Gaze-induced Absent  Head shaking Horizontal Absent  Head  Shaking Vertical Absent  Smooth Pursuits Intact  Saccades Intact  Comment head thrust (-) bil  Vestibulo-Occular Reflex  VOR 1 Head Only (x 1 viewing) intact  Visual Acuity  Static (reports she has not been to eye MD in years, bifocals)  Auditory  Comments no ringing or fullness reported in ears  Positional Testing  Dix-Hallpike Dix-Hallpike Right;Dix-Hallpike Left  Sidelying Test Sidelying Right;Sidelying Left  Horizontal Canal Testing Horizontal Canal Right;Horizontal Canal Left;Horizontal Canal Right  Intensity;Horizontal Canal Left Intensity  Dix-Hallpike Right  Dix-Hallpike Right Duration 0 (modified, pt refused traditional testing position)  Dix-Hallpike Right Symptoms No nystagmus  Dix-Hallpike Left  Dix-Hallpike Left Duration 0  Dix-Hallpike Left Symptoms No nystagmus (modified position in side lying, refused traditional positio)  Sidelying Right  Sidelying Right Duration <1 min  Sidelying Right Symptoms No nystagmus;Other (comment) (symptoms of spinning only)  Sidelying Left  Sidelying Left Duration <1 min  Sidelying Left Symptoms No nystagmus;Other (comment) (symptoms in side lying better than right side)  Horizontal Canal Right  Horizontal Canal Right Duration 0  Horizontal Canal Right Symptoms Normal  Horizontal Canal Left  Horizontal Canal Left Duration <1 min  Horizontal Canal Left Symptoms Normal;Other (comment) (symptoms only)  Horizontal Canal Left Intensity  Horizontal Canal Left Intensity Moderate  Cognition  Cognition Orientation Level Appropriate for developmental age  Positional Sensitivities  Sit to Supine 3  Supine to Right Side 3  Supine to Sitting 3  Head Turning x 5 0  Head Nodding x 5 0  Rolling Right 2  Rolling Left 0                          Pertinent Vitals/Pain Pain Assessment: Faces Faces Pain Scale: Hurts little more Pain Location: head Pain Descriptors / Indicators: Aching Pain Intervention(s): Limited activity within patient's tolerance;Monitored during session;Repositioned    Home Living Family/patient expects to be discharged to:: Private residence Living Arrangements: Alone Available Help at Discharge: Family;Available PRN/intermittently (family and neighbors/friends) Type of Home: Apartment Home Access: Stairs to enter Entrance Stairs-Rails: None Entrance Stairs-Number of Steps: 3 Home Layout: One level Home Equipment: Walker - 2 wheels;Cane - single point      Prior Function Level of Independence: Independent          Comments: Pt reports she has never used the cane or the walker even during her vertigo flare ups, but does have them there if she needs them.  She is on disability and does not drive.  Her mother takes her to the store.         Extremity/Trunk Assessment   Upper Extremity Assessment: Overall WFL for tasks assessed           Lower Extremity Assessment: Overall WFL for tasks assessed      Cervical / Trunk Assessment: Normal  Communication   Communication: No difficulties  Cognition Arousal/Alertness: Awake/alert Behavior During Therapy: WFL for tasks assessed/performed Overall Cognitive Status: No family/caregiver present to determine baseline cognitive functioning                      General Comments General comments (skin integrity, edema, etc.): Pt reports she just had meclazine before I came to see her.  The RN reports her last meclazine was at 10 am today.  It should have worn off by now.            Assessment/Plan    PT Assessment Patient needs continued PT services  PT Diagnosis Difficulty walking;Abnormality of gait;Generalized weakness;Other (comment) (vertigo)   PT Problem List Decreased strength;Decreased activity tolerance;Decreased balance;Decreased mobility;Decreased knowledge of use of DME;Obesity;Other (comment) (vertigo)  PT Treatment Interventions DME instruction;Gait training;Stair training;Functional mobility training;Therapeutic activities;Therapeutic exercise;Balance training;Neuromuscular re-education;Patient/family education   PT Goals (Current goals can be found in the Care Plan section) Acute Rehab PT Goals Patient Stated Goal: to feel better PT Goal Formulation: With patient Time For Goal Achievement: 06/29/16 Potential to Achieve Goals: Good    Frequency Min 4X/week           End of Session   Activity Tolerance: Other (comment) (limited by dizziness) Patient left: in bed;with call bell/phone within reach Nurse  Communication: Mobility status;Other (comment) (BPs)    Functional Assessment Tool Used: assist level Functional Limitation: Mobility: Walking and moving around Mobility: Walking and Moving Around Current Status [phone]): At least 20 percent but less than 40 percent impaired, limited or restricted Mobility: Walking and Moving Around Goal Status [phone]): At least 1 percent but less than 20 percent impaired, limited or restricted    Time: 1720-1755 PT Time Calculation (min) (ACUTE ONLY): 35 min   Charges:   PT Evaluation $PT Eval Moderate Complexity: 1 Procedure PT Treatments $Therapeutic Activity: 8-22 mins   PT G Codes:   PT G-Codes **NOT FOR INPATIENT CLASS** Functional Assessment Tool Used: assist level Functional Limitation: Mobility: Walking and moving around Mobility: Walking and Moving Around Current Status (U9811): At least 20 percent but less than 40 percent impaired, limited or restricted Mobility: Walking and Moving Around Goal Status [phone]): At least 1 percent but less than 20 percent impaired, limited or restricted    Burr Soffer B. Jackson Coffield, PT, DPT [phone]   06/15/2016, 6:12 PM

## 2016-06-15 NOTE — Progress Notes (Signed)
Patient has been c/o epigastric pain and some nausea since lunchtime. She received simethicone prior to this and then received a dose of GI cocktail. Neither were very effective. MD notified and came to see patient. Aware of slightly elevated troponin at 0.03. Vitals have been stable. Awaiting new orders.  Leanna BattlesEckelmann, Otha Rickles Eileen, RN.

## 2016-06-15 NOTE — ED Notes (Signed)
Attempted report 

## 2016-06-15 NOTE — ED Notes (Signed)
Pt given two cokes 

## 2016-06-16 DIAGNOSIS — R0789 Other chest pain: Principal | ICD-10-CM

## 2016-06-16 DIAGNOSIS — R55 Syncope and collapse: Secondary | ICD-10-CM

## 2016-06-16 LAB — BASIC METABOLIC PANEL
Anion gap: 5 (ref 5–15)
BUN: 10 mg/dL (ref 6–20)
CALCIUM: 8.7 mg/dL — AB (ref 8.9–10.3)
CHLORIDE: 112 mmol/L — AB (ref 101–111)
CO2: 26 mmol/L (ref 22–32)
CREATININE: 1.2 mg/dL — AB (ref 0.44–1.00)
GFR, EST NON AFRICAN AMERICAN: 53 mL/min — AB (ref >60)
Glucose, Bld: 100 mg/dL — ABNORMAL HIGH (ref 65–99)
Potassium: 4.5 mmol/L (ref 3.5–5.1)
SODIUM: 143 mmol/L (ref 135–145)

## 2016-06-16 MED ORDER — PANTOPRAZOLE SODIUM 40 MG PO TBEC: 40.0000 mg | DELAYED_RELEASE_TABLET | Freq: Two times a day (BID) | ORAL | 3 refills | Status: DC

## 2016-06-16 MED ORDER — DICLOFENAC SODIUM 1 % TD GEL: 2.0000 g | Freq: Four times a day (QID) | TRANSDERMAL | 2 refills | Status: DC

## 2016-06-16 NOTE — Discharge Summary (Signed)
Name: [name] MRN: [id number] DOB: [birth year] 45 y.o. PCP: Wendy PaulaJennifer T Krall, MD  Date of Admission: 06/14/2016 10:13 PM Date of Discharge: 06/16/2016 Attending Physician: No att. providers found  Discharge Diagnosis: Musculoskeletal chest pain Principal Problem:   Musculoskeletal chest pain Active Problems:   Hypothyroidism   HYPERCHOLESTEROLEMIA, PURE   Bipolar I disorder, most recent episode depressed (HCC)   GERD   History of DVT of lower extremity   Tobacco abuse   Vertigo   HTN (hypertension)   Near syncope   Discharge Medications:   Medication List    TAKE these medications   ARIPiprazole 30 MG tablet Commonly known as:  ABILIFY Take 30 mg by mouth at bedtime.   buPROPion 150 MG 24 hr tablet Commonly known as:  WELLBUTRIN XL Take 150 mg by mouth every morning.   busPIRone 10 MG tablet Commonly known as:  BUSPAR Take 10 mg by mouth 4 (four) times daily.   diclofenac sodium 1 % Gel Commonly known as:  VOLTAREN Apply 2 g topically 4 (four) times daily.   gabapentin 300 MG capsule Commonly known as:  NEURONTIN TAKE 1 CAPSULE(300 MG) BY MOUTH THREE TIMES DAILY FOR NEUROPATHIC PAIN   levothyroxine 100 MCG tablet Commonly known as:  SYNTHROID, LEVOTHROID TAKE 1 TABLET(100 MCG) BY MOUTH DAILY   liothyronine 5 MCG tablet Commonly known as:  CYTOMEL Take 5 mcg by mouth daily.   meclizine 25 MG tablet Commonly known as:  ANTIVERT TAKE 1 TABLET(25 MG) BY MOUTH TWICE DAILY AS NEEDED FOR DIZZINESS What changed:  how much to take  how to take this  when to take this  additional instructions   pantoprazole 40 MG tablet Commonly known as:  PROTONIX Take 1 tablet (40 mg total) by mouth 2 (two) times daily. Take 30 minutes before breakfast and dinner. What changed:  when to take this  additional instructions   prochlorperazine 10 MG tablet Commonly known as:  COMPAZINE TAKE 1 TABLET(10 MG) BY MOUTH EVERY 6 HOURS AS NEEDED FOR MIGRAINE  HEADACHE   propranolol 80 MG tablet Commonly known as:  INDERAL Take 1 tablet (80 mg total) by mouth 3 (three) times daily.   rizatriptan 5 MG tablet Commonly known as:  MAXALT TAKE 1 TABLET(5 MG) BY MOUTH 1 TIME AS NEEDED FOR MIGRAINE. MAY REPEAT IN 2 HOURS AS NEEDED   simvastatin 40 MG tablet Commonly known as:  ZOCOR Take 1 tablet (40 mg total) by mouth every morning.   traZODone 100 MG tablet Commonly known as:  DESYREL Take 1 tablet (100 mg total) by mouth at bedtime. For sleep       Disposition and follow-up:   [name] [facility].  At the hospital follow up visit please address:  1.  --chest pain (costochondritis vs gerd), d/c with voltaren gel which provided relief --GERD symptoms and esophageal spasm improved on BID PPI? --would like to speak to [name] about weight management and dietary suggestions  2.  Labs / imaging needed at time of follow-up:  --TSH (on double thyroid replacement per Dr. Sharl MaKerr who she hasn't seen recenly; [facility] last on 12/29/15 TSH 2.270 --Stress test? Had previous workup with Dr. Clarene DukeLittle ~12 years ago; initially chest pain was non-reproducible and presented with shortness of breath; neg troponins.   3.  Pending labs/ test needing follow-up: none  Follow-up Appointments: Follow-up Information    [facility] .  Specialty:  Rehabilitation Why:  outpt pt, they will contact you at home.  Call [phone] to see if you can get assistance in paying for outpatient pt. Contact information: [address] [id number] mc [place] [address] [phone]       [facility]. Go on 06/23/2016.   Why:  2:45 with Dr. Gladys Damme information: [address] [place] [address] [phone]          Hospital Course by problem list: Principal Problem:    Musculoskeletal chest pain Active Problems:   Hypothyroidism   HYPERCHOLESTEROLEMIA, PURE   Bipolar I disorder, most recent episode depressed (HCC)   GERD   History of DVT of lower extremity   Tobacco abuse   Vertigo   HTN (hypertension)   Near syncope   Musculoskeletal Chest Pain:  Pt has 2-3 days of non-radiating chest tightness, rated 6/10, which was associated with SOB described as "taking her breath away." EKG demonstrates T-wave inversions in anterolateral leads which were present in EKG from 2001 however were not present in EKG from 2015. CXR without acute process. Shortly after admission, pt's pain was resolved with GI cocktail. Troponins: neg, neg, 0.03. While in hospital, pt states pain began again, is dull, nonradiating, without shortness of breath and without relief with GI cocktail. Pain is reproducible in parasternal regions L>R. Voltaren gel relieved pain almost to zero. Patient described symptoms of esophageal spasms (regurgitation of food without complete swallowing associated with dull pain in the chest, with ice cream and pickles) prior to hospitalizations which could be contributing to chest pain. We will try increasing PPI to BID.   Orthostatic Hypotension Patient presented to the ED with complaints of dizziness on standing with feeling of the room spinning almost to the point of passing out. She does have a history of vertigo on meclizine which she reports being compliant with. She reported a few episodes of diarrhea in the 4-5 days before admission. Patients orthostatics were positive in ED; she was given a 1L bolus and started on continuous IVF. Patients symptoms improved significantly since fluid administration. Repeat orthostatics were negative, though patient still noted some dizziness on standing that was more like her usual vertigo. Initial orthostatic hypotension was most likely caused by volume depletion 2/2 diarrhea. Pt had no episodes of diarrhea during admission and  was tolerating food well.   GERD Patient has long-standing history of GERD and takes Protonix at home which was continued. She described symptoms of esophageal spasm which could be exacerbated by uncontrolled GERD. On d/c we will try PPI BID for a couple of weeks. Patient educated on correct usage of PPI.  Vertigo Patient reports long history of Vertigo with infrequent exacerbations. She reports taking Meclizine BID daily with rare breakthroughs. Her Meclizine BID was continued on admission. PT recommended outpatient rehab for symptom management if insurance would cover it.   Hypothyroidism Home Synthroid and Cytomel 5 which were continued. She was being managed by Dr. Sharl Ma with Endocrinology but has not seen him in a long time (she could not specify). F/u on TSH level outpatient with consideration of re-evaluating her current treatment.  Bipolar disorder Patient reports good control of symptoms with her current regimen and sees psych as an outpatient. Treated with Abilify, Wellbutrin, and Buspar which were continued.  Hyperlipidemia Patient on management with Simvastatin 40mg , which was continued.   Discharge Vitals:   BP (!) 139/94 (BP Location: Left Arm)   Pulse Marland Kitchen)  54   Temp 98.4 F (36.9 C) (Oral)   Resp 16   Ht 5\' 5"  (1.651 m)   Wt 109.2 kg (240 lb 11.9 oz)   SpO2 97%   BMI 40.06 kg/m    Pertinent Labs, Studies, and Procedures:  BMP Latest Ref Rng & Units 06/16/2016 06/15/2016 06/14/2016  Glucose 65 - 99 mg/dL 409(W100(H) 85 119(J120(H)  BUN 6 - 20 mg/dL 10 11 11   Creatinine 0.44 - 1.00 mg/dL 4.78(G1.20(H) 9.56(O1.09(H) 1.30(Q1.16(H)  BUN/Creat Ratio 9 - 23 - - -  Sodium 135 - 145 mmol/L 143 140 140  Potassium 3.5 - 5.1 mmol/L 4.5 4.4 3.6  Chloride 101 - 111 mmol/L 112(H) 111 106  CO2 22 - 32 mmol/L 26 25 24   Calcium 8.9 - 10.3 mg/dL 6.5(H8.7(L) 8.4(O8.4(L) 9.2    Ref. Range 06/14/2016 22:32  WBC Latest Ref Range: 4.0 - 10.5 K/uL 10.5  RBC Latest Ref Range: 3.87 - 5.11 MIL/uL 4.63  Hemoglobin Latest  Ref Range: 12.0 - 15.0 g/dL 96.213.8  HCT Latest Ref Range: 36.0 - 46.0 % 40.6  MCV Latest Ref Range: 78.0 - 100.0 fL 87.7  MCH Latest Ref Range: 26.0 - 34.0 pg 29.8  MCHC Latest Ref Range: 30.0 - 36.0 g/dL 95.234.0  RDW Latest Ref Range: 11.5 - 15.5 % 13.5  Platelets Latest Ref Range: 150 - 400 K/uL 133 (L)    Ref. Range 06/15/2016 00:53 06/15/2016 06:53 06/15/2016 13:20  Troponin I Latest Ref Range: <0.03 ng/mL <0.03 <0.03 0.03 [facility])   Discharge Instructions: Discharge Instructions    Diet - low sodium heart healthy    Complete by:  As directed   Discharge instructions    Complete by:  As directed   Please continue to apply the voltaren gel on your chest for pain as needed every 4 hours.   We changed how you take your Protonix (pantoprazole). You will take it twice a day; 30 minutes before breakfast and 30 minutes before dinner. Hopefully that will help you with your acid reflux and feeling of esophageal spasm.  Please keep your appointment with Dr. Isabella BowensKrall on Thursday Aug 24th at 2:45.  Thank you!   Increase activity slowly    Complete by:  As directed      Signed: Nyra MarketGorica General Wearing, MD 06/16/2016, 3:02 PM   Pager: [phone]

## 2016-06-16 NOTE — Progress Notes (Signed)
Subjective: Patient states that her pain is greatly improved after voltaren gel application. She states the pain is almost gone. Upon discussion with patient, she remembered symptoms of regurgitation upon swallowing and tightening feeling in chest with ice cream, pickles, and some other foods that is intermittent. She states she has not had these symptoms in the hospital. Her last EGD was 12 years ago which was negative per patient. She is on PPI for GERD, which she states she takes 30-45 mins before her first meal.    Objective:  Vital signs in last 24 hours: Vitals:   06/16/16 0004 06/16/16 0544 06/16/16 0752 06/16/16 0929  BP: 124/90 124/84 129/86 (!) 134/96  Pulse: 75 72 80 71  Resp: 15 12 (!) 23 18  Temp: 97.9 F (36.6 C) 98.7 F (37.1 C) 97.9 F (36.6 C)   TempSrc: Oral Oral Oral   SpO2: 92% 96% 95%   Weight:      Height:       Constitutional: NAD CV: RRR, no murmurs, rubs or gallops Chest: mildly tender to palpation bilaterally in parasternal regions L>R Abd: soft, nontender, nondistended, +BS Ext: 2+ pulses throughout, no edema  Assessment/Plan:  Principal Problem:   Pain in the chest Active Problems:   Hypothyroidism   HYPERCHOLESTEROLEMIA, PURE   Bipolar I disorder, most recent episode depressed (HCC)   GERD   History of DVT of lower extremity   Tobacco abuse   Vertigo   HTN (hypertension)   Near syncope  Orthostatic Hypotension Patients orthostats positive in ED. Bolus of 1 L ns given in ED and IVF continued at 125 mL/hr. Patients symptoms have improved significantly since fluid administration. She does report a history of orthostatic hypotension and vertigo, controlled with meclizine BID. Pt has had several episodes of diarrhea in the past 4-5 days prior to admission, which likely accounts for current symptoms.  --repeat orthostatics were negative  Chest pain Pt has 2-3 days of non-radiating chest tightness, rated 6/10, which was associated with SOB  described as "taking her breath away." EKG demonstrates T-wave inversions in anterolateral leads which were present in EKG from 2001 however were not present in EKG from 2015. CXR without acute process. Shortly after admission, pt's pain was resolved with GI cocktail. Today pt states pain has resumed, is dull, nonradiating, without shortness of breath and without relief with GI cocktail. Pain is reproducible in parasternal regions L>R. Voltaren gel relieved pain almost to zero. Troponins: neg, neg, 0.03. Patient described symptoms of esophageal spasms prior to hospitalizations which could be contributing to chest pain. We will try increasing PPI to BID. --Voltaren gel --plan for outpatient stress test (was Dr. Fredirick MaudlinLittle's pt)  GERD Patient has long-standing history of GERD and takes Protonix at home which was continued. She described symptoms of esophageal spasm which could be exacerbated by uncontrolled GERD. On d/c we will try PPI BID for a couple of weeks. Patient educated on correct usage of PPI.  Vertigo Patient reports long history of Vertigo with infrequent exacerbations. She reports taking Meclizine BID daily with rare breakthroughs. Continued  HTN Stable at this time.  Hypothyroidism Home Synthroid 100mcg and Cytomel 5 which were continued.   Bipolar disorder Patient reports good control of symptoms with her current regimen and sees psych as an outpatient. Treated with Abilify, Wellbutrin, and Buspar which were continued.   Hyperlipidemia Patient on management with Simvastatin 40mg , which was continued.  Dispo: Anticipated discharge in approximately 1 day.   Nyra MarketGorica Virdell Hoiland, MD 06/16/2016,  11:35 AM Pager: [phone]

## 2016-06-16 NOTE — Progress Notes (Signed)
D/c instructions reviewed with pt, copy of instructions given to pt, pt d/c'd at this time via wheelchair with her belongings, pt's mother picking her up at ER entrance.

## 2016-06-16 NOTE — Care Management Note (Signed)
Case Management Note  Patient Details  Name: [name] MRN: [id number] Date of Birth: May 17, 1971  Subjective/Objective:  Patient is from home alone, pta indep.  PCP is Griffin BasilJennifer Krall, has [employer] insurance. Presents with chest pain and dizziness, Per pt eval rec outpatient pt.  Patient states she would like to get outpatient pt but can not pay privately for it, because Medicaid usually does not cover this.  NCM informed her that will make referral for her and gave her number to patient accounting to see if she would be able to get any assistance with the outpatient pt.  NCM informed her that Outpatient rehab will contact her at home to schedule apt time.                  Action/Plan:   Expected Discharge Date:  06/16/16               Expected Discharge Plan:  Home/Self Care  In-House Referral:     Discharge planning Services  CM Consult  Post Acute Care Choice:    Choice offered to:     DME Arranged:    DME Agency:     HH Arranged:    HH Agency:     Status of Service:  Completed, signed off  If discussed at [employer] Length of Stay Meetings, dates discussed:    Additional Comments:  Leone Havenaylor, Keshawna Dix Clinton, RN 06/16/2016, 10:28 AM

## 2016-06-16 NOTE — Progress Notes (Signed)
Physical Therapy Treatment Patient Details Name: [name] MRN: [id number] DOB: 1970/11/18 Today's Date: 06/16/2016    History of Present Illness 45 y.o. female [facility] for dizzinessand chest pain with associated SOB. Medical and cardiac workup pending.  EKG showed T-wave inversions which were present on EKG in 2001, but not 2015.  orthostatic vitals were (+) in the ED.  Pt with significant PMhx of migraines, DVT, bipolar disorder, and R TKA.      PT Comments    Pt progressing toward goals. Tolerated ambulation of 120 ft well this session with supervision and no AD. PT treated symptomatic vertigo with modified gufoni's maneuver for L horizontal canal. Pt with no report of dizziness after treatment. PT recommendation remains appropriate. Continue acute follow to assess gait and vestibular deficits.    Follow Up Recommendations  Outpatient PT;Other (comment) (vestibular PT)     Equipment Recommendations  None recommended by PT    Recommendations for Other Services       Precautions / Restrictions Precautions Precautions: Fall Precaution Comments: due to dizziness in standing.  Restrictions Weight Bearing Restrictions: No    Mobility  Bed Mobility Overal bed mobility: Modified Independent                Transfers Overall transfer level: Needs assistance Equipment used: None Transfers: Sit to/from Stand Sit to Stand: Supervision         General transfer comment: Pt able to stand without use of bedrails for balance. No report of dizziness for sit<>stand. Pt with slight LOB while turning to sit in chair, requiring supervision for safety but able to self correct.    Ambulation/Gait Ambulation/Gait assistance: Supervision Ambulation Distance (Feet): 120 Feet Assistive device: None Gait Pattern/deviations: Step-to pattern;Wide base of support;Decreased stride length   Gait velocity interpretation: Below normal speed for age/gender General  Gait Details: Pt with hesitancy to perform vertical or horizontal head movement during ambulation. Decreased gait speed and reaching for wall with vertical movements. Decreased gait speed and drifting with horizontal head movements. Only slight increased in gait speed when cued to walk as fast as pt feels safe.  HR reached 90 bpm and RR reached 19 bpm during ambulation.   Stairs            Wheelchair Mobility    Modified Rankin (Stroke Patients Only)       Balance Overall balance assessment: Needs assistance Sitting-balance support: Feet supported;No upper extremity supported Sitting balance-Leahy Scale: Good     Standing balance support: No upper extremity supported Standing balance-Leahy Scale: Fair       06/16/16 1007  Vestibular Assessment  General Observation Eye alignment WNL, no reports of pain or dizziness at rest or with mobility.    Symptom Behavior  Type of Dizziness Spinning  Frequency of Dizziness with position changes (rolling, supine to sit, sit to stand, sit to supine).   Duration of Dizziness < 1 min  Aggravating Factors Supine to sit;Sit to stand;Rolling to right;Rolling to left;Activity in general  Relieving Factors Closing eyes;Rest;Lying supine (lying supine with head elevated)  Occulomotor Exam  Occulomotor Alignment Normal  Spontaneous Absent  Positional Testing  Sidelying Test Sidelying Right;Sidelying Left  Horizontal Canal Testing Horizontal Canal Right;Horizontal Canal Left  Sidelying Right  Sidelying Right Duration 0  Sidelying Right Symptoms No nystagmus  Sidelying Left  Sidelying Left Duration 0  Sidelying Left Symptoms No nystagmus  Horizontal Canal Right  Horizontal Canal Right Duration 0  Horizontal Canal  Right Symptoms Normal  Horizontal Canal Left  Horizontal Canal Left Duration <1 min  Horizontal Canal Left Symptoms Normal (symptomatic spinning )  Horizontal Canal Right Intensity  Horizontal Canal Right Intensity Mild   Right Intensity Comment she did not clutch to rail, only wanted to close eyes  Cognition  Cognition Orientation Level Appropriate for developmental age  Positional Sensitivities  Sit to Supine 3  Supine to Left Side 0  Supine to Sitting 2  Rolling Right 0  Rolling Left 0  Positional Sensitivities Comments Turning head left 3                     Cognition Arousal/Alertness: Awake/alert Behavior During Therapy: WFL for tasks assessed/performed Overall Cognitive Status: No family/caregiver present to determine baseline cognitive functioning                      Exercises      General Comments General comments (skin integrity, edema, etc.): Pt reports feeling better while walking. Still hesitant with head movement due to previous dizziness.        Pertinent Vitals/Pain Pain Assessment: No/denies pain    Home Living                      Prior Function            PT Goals (current goals can now be found in the care plan section) Acute Rehab PT Goals Patient Stated Goal: to go home today PT Goal Formulation: With patient Time For Goal Achievement: 06/29/16 Potential to Achieve Goals: Good Progress towards PT goals: Progressing toward goals    Frequency  Min 4X/week    PT Plan Current plan remains appropriate    Co-evaluation             End of Session Equipment Utilized During Treatment: Gait belt Activity Tolerance: Patient tolerated treatment well Patient left: in chair;with call bell/phone within reach     Time: 0925-0959 PT Time Calculation (min) (ACUTE ONLY): 34 min  Charges:  $Gait Training: 8-22 mins $Canalith Rep Proc: 8-22 mins                    G Codes:      Dreama SaaCara Kelsen Celona 06/16/2016, 10:33 AM  Park Literara A Kirandeep Fariss, SPT (student physical therapist) Acute Rehabilitation Services [phone]

## 2016-06-17 ENCOUNTER — Other Ambulatory Visit: Payer: Self-pay | Admitting: Pulmonary Disease

## 2016-06-20 ENCOUNTER — Other Ambulatory Visit: Payer: Self-pay | Admitting: Internal Medicine

## 2016-06-20 ENCOUNTER — Other Ambulatory Visit: Payer: Self-pay | Admitting: Pulmonary Disease

## 2016-06-23 ENCOUNTER — Encounter: Payer: Self-pay | Admitting: Pulmonary Disease

## 2016-06-23 ENCOUNTER — Ambulatory Visit (INDEPENDENT_AMBULATORY_CARE_PROVIDER_SITE_OTHER): Payer: Medicaid Other | Admitting: Pulmonary Disease

## 2016-06-23 VITALS — BP 110/69 | HR 62 | Temp 98.3°F | Resp 20 | Wt 236.0 lb

## 2016-06-23 DIAGNOSIS — E669 Obesity, unspecified: Secondary | ICD-10-CM

## 2016-06-23 DIAGNOSIS — Z9049 Acquired absence of other specified parts of digestive tract: Secondary | ICD-10-CM | POA: Diagnosis not present

## 2016-06-23 DIAGNOSIS — Z6839 Body mass index (BMI) 39.0-39.9, adult: Secondary | ICD-10-CM

## 2016-06-23 DIAGNOSIS — I10 Essential (primary) hypertension: Secondary | ICD-10-CM

## 2016-06-23 DIAGNOSIS — E039 Hypothyroidism, unspecified: Secondary | ICD-10-CM

## 2016-06-23 DIAGNOSIS — K219 Gastro-esophageal reflux disease without esophagitis: Secondary | ICD-10-CM | POA: Diagnosis not present

## 2016-06-23 DIAGNOSIS — R42 Dizziness and giddiness: Secondary | ICD-10-CM

## 2016-06-23 DIAGNOSIS — Z79899 Other long term (current) drug therapy: Secondary | ICD-10-CM | POA: Diagnosis not present

## 2016-06-23 DIAGNOSIS — R197 Diarrhea, unspecified: Secondary | ICD-10-CM

## 2016-06-23 NOTE — Progress Notes (Signed)
   CC: chest pain follow up  HPI:  [name] is a 45 year old woman with history of DVT s/p 1 year of coumadin, migraines, hypothyroidism, bipolar disorder presenting for follow up of her chest pain and hypothyroidism.  Chest pain: Resolved.  GERD: Taking PPI twice a day. Symptoms controlled and improved.   Vertigo greatly improved. Taking meclizine twice a day still. She has not done outpatient rehab.  Has had loose stools for a long time. Occurs about an hour after eating. Denies fevers or chills. No hematochezia. No melena. No nausea or vomiting. She has abdominal pain with her episodes of loose stools. Has to take immodium every day so that her stools are more formed. Has had lap appendectomy and lap cholecystectomy in the past.  Sees Dr. Pennie RushingHaygood, gynecology. 2015 had pap smear. Had hysterectomy 15 years ago.   Past Medical History:  Diagnosis Date  . Bipolar 1 disorder (HCC)    Followed by Mental Health.  All psychiatric medications prescribed by Mental Health.  Stable for many years.    . Deep venous thrombosis of leg (HCC) 2012   completed year of coumadin   . Dyspareunia 03/04/11  . Hx of measles   . Hypothyroidism    Hypothyroidism since 8th grade.  Managed by Dr. Sharl MaKerr, Endocrinology.  On synthroid.  No prior thyroid surgery/ablation.   . Migraines   . Monilia infection 12/31/10  . Vaginal atrophy 03/04/11    Review of Systems:   Constitutional: no fevers/chills Eyes: no vision changes Respiratory: no shortness of breath   Physical Exam:  Vitals:   06/23/16 1502  BP: 110/69  Pulse: 62  Resp: 20  Temp: 98.3 F (36.8 C)  TempSrc: Oral  SpO2: 96%  Weight: 107 kg (236 lb)   General Apperance: NAD HEENT: Normocephalic, atraumatic, anicteric sclera Neck: Supple, trachea midline Lungs: Clear to auscultation bilaterally. No wheezes, rhonchi or rales. Breathing comfortably Heart: Regular rate and rhythm, no murmur/rub/gallop Abdomen: Soft, nontender,  nondistended, no rebound/guarding Extremities: Warm and well perfused, no edema Skin: No rashes or lesions Neurologic: Alert and interactive. No gross deficits.   Assessment & Plan:   See Encounters Tab for problem based charting.  Patient discussed with Dr. Oswaldo DoneVincent

## 2016-06-23 NOTE — Patient Instructions (Addendum)
Continue taking Protonix twice a day for three more weeks, then return to once a day.  Call [phone] to see if you can get assistance in paying for outpatient physical therapy.  Please try avoiding lactose. This can be found in dairy products.   Some people get diarrhea after they have had their gallbladder removed, Try avoiding high-fat or spicy foods.  Follow up in 6-8 weeks

## 2016-06-24 DIAGNOSIS — E669 Obesity, unspecified: Secondary | ICD-10-CM

## 2016-06-24 HISTORY — DX: Obesity, unspecified: E66.9

## 2016-06-24 LAB — BMP8+ANION GAP
Anion Gap: 17 mmol/L (ref 10.0–18.0)
BUN / CREAT RATIO: 11 (ref 9–23)
BUN: 15 mg/dL (ref 6–24)
CO2: 23 mmol/L (ref 18–29)
CREATININE: 1.34 mg/dL — AB (ref 0.57–1.00)
Calcium: 9.3 mg/dL (ref 8.7–10.2)
Chloride: 102 mmol/L (ref 96–106)
GFR calc Af Amer: 55 mL/min/{1.73_m2} — ABNORMAL LOW (ref >59)
GFR, EST NON AFRICAN AMERICAN: 48 mL/min/{1.73_m2} — AB (ref >59)
Glucose: 81 mg/dL (ref 65–99)
Potassium: 4.2 mmol/L (ref 3.5–5.2)
SODIUM: 142 mmol/L (ref 134–144)

## 2016-06-24 LAB — TSH: TSH: 1.63 u[IU]/mL (ref 0.450–4.500)

## 2016-06-24 NOTE — Assessment & Plan Note (Signed)
She has been on meclizine since 2015. Was evaluated by PT during recent hospitalization. They recommended outpatient PT.  Noted symptoms(spinning) with horizontal canal testing, but without actual nystagmus.   Discuss with her ENT referral as an OP to f/u on her vertigo episodes at next visit.  Gave her phone number for possible financial assistance with OP PT for further vertigo testing. Consider MRI with and without contrast. Previously had MRI without contrast in 2014 which was unremarkable.

## 2016-06-24 NOTE — Assessment & Plan Note (Signed)
Assessment: BP 110/69  Plan:  On propranolol for her migraines.

## 2016-06-24 NOTE — Assessment & Plan Note (Signed)
Improved on BID PPI. Will continue for total 4 weeks then go back to once daily dosing. Watch for red flag symptoms.

## 2016-06-24 NOTE — Assessment & Plan Note (Signed)
Wants to discuss healthy lifestyle changes with [name]. Referral placed.

## 2016-06-24 NOTE — Assessment & Plan Note (Signed)
Assessment: She reports loose stools that fill toilet bowl and somewhat greasy for years. She avoids lactose.  Plan: Could be post cholecystectomy diarrhea. Avoid high fat and spicy foods. Consider further work up at follow up if still ongoing, bile acid sequestrant, etc

## 2016-06-24 NOTE — Assessment & Plan Note (Signed)
Assessment: Cytomel still showing up - will have to ask patient at next visit who is prescribing this. Has not been back to see Dr. Sharl MaKerr because she has a balance at his clinic. TSH within normal limits presently.  Plan: Continue Synthroid daily

## 2016-06-27 NOTE — Progress Notes (Signed)
Internal Medicine Clinic Attending  Case discussed with Dr. Krall at the time of the visit.  We reviewed the resident's history and exam and pertinent patient test results.  I agree with the assessment, diagnosis, and plan of care documented in the resident's note.  

## 2016-06-29 ENCOUNTER — Telehealth: Payer: Self-pay | Admitting: *Deleted

## 2016-06-29 NOTE — Telephone Encounter (Signed)
Thanks for updating her. Nothing to do about renal function. Will recheck at follow up.

## 2016-06-29 NOTE — Telephone Encounter (Signed)
Call from patient requesting her "thyroid" results.  Pt informed that results are wnl, she then proceeded to ask about her "kidney" test. Pt informed creatinine was a little elevated from the last time it was check.   She wants to know if anything is going to be done about it. Pt already has an appt scheduled for 10/5 w/pcp.  Will forward info to pcp for review.  Please advise. Criss Alvine.Goldston, Darlene Cassady8/30/201710:29 AM

## 2016-07-07 ENCOUNTER — Encounter: Payer: Self-pay | Admitting: *Deleted

## 2016-07-16 ENCOUNTER — Other Ambulatory Visit: Payer: Self-pay | Admitting: Pulmonary Disease

## 2016-07-18 ENCOUNTER — Other Ambulatory Visit: Payer: Self-pay | Admitting: Pulmonary Disease

## 2016-08-04 ENCOUNTER — Ambulatory Visit (INDEPENDENT_AMBULATORY_CARE_PROVIDER_SITE_OTHER): Payer: Medicaid Other | Admitting: Dietician

## 2016-08-04 ENCOUNTER — Ambulatory Visit (INDEPENDENT_AMBULATORY_CARE_PROVIDER_SITE_OTHER): Payer: Medicaid Other | Admitting: Pulmonary Disease

## 2016-08-04 ENCOUNTER — Encounter: Payer: Self-pay | Admitting: Pulmonary Disease

## 2016-08-04 VITALS — BP 106/67 | HR 64 | Temp 98.6°F | Ht 64.0 in | Wt 231.8 lb

## 2016-08-04 DIAGNOSIS — Z713 Dietary counseling and surveillance: Secondary | ICD-10-CM | POA: Diagnosis not present

## 2016-08-04 DIAGNOSIS — Z8379 Family history of other diseases of the digestive system: Secondary | ICD-10-CM

## 2016-08-04 DIAGNOSIS — K529 Noninfective gastroenteritis and colitis, unspecified: Secondary | ICD-10-CM

## 2016-08-04 DIAGNOSIS — E669 Obesity, unspecified: Secondary | ICD-10-CM | POA: Diagnosis not present

## 2016-08-04 DIAGNOSIS — R197 Diarrhea, unspecified: Secondary | ICD-10-CM | POA: Diagnosis not present

## 2016-08-04 DIAGNOSIS — Z6839 Body mass index (BMI) 39.0-39.9, adult: Secondary | ICD-10-CM

## 2016-08-04 NOTE — Patient Instructions (Addendum)
  You are doing a great job- keep up the work eating healthier cutting back on ice cream, soda!  Tell your support friends what you learned today..  You need about 1900-2100 Calories/day to maintain your weight.  You need [phone] Calories/day to lose 1 lb per week.  You need [phone] Calories/day to lose 2 lb per week.  Calories are how much energy your body needs to run ( like gas to a car)  We get calories from the food we eat.   Fat on our body is the way our body stores calories for later use.  We want to force our body to take calories out of storage and use them so we can loose fat/weight.   Coke has 100 calories in 8 ounces. It also has phosphoric acid as a preservative that is not good for out kidneys.    Women have 100 calories to spend on sugar added to food that does not have nutrition with it - sweets like soda ice cream, yogurt, jelly, etc... Have added sugar Recommend eating every 2-4 hours a day to PREVENT low blood sugar symptoms.      Also talk to them about 30 minutes activity for 5 days of the week. (Or however you want to get the recommend 150 minutes of activity per week. Dance, yoga or do zumba? )    Cutting calories + 150-300 minutes of activity each week usually = weight loss    Hypoglycemia Low blood sugar (hypoglycemia) means that the level of sugar in your blood is lower than it should be. Signs of low blood sugar include:  Getting sweaty.  Feeling hungry.  Feeling dizzy or weak.  Feeling sleepier than normal.  Feeling nervous.  Headaches.  Having a fast heartbeat. Low blood sugar can happen fast and can be an emergency. Your doctor can do tests to check your blood sugar level. You can have low blood sugar and not have diabetes. HOME CARE   take 1 of the following:  3 to 4 glucose tablets.   cup  juice.   cup soda  not diet.  1 cup milk.  5 to 6 hard candies.  Wait 15 minutes. Repeat the above treatment if you do not feel  better .  Eat a snack if it is more than 1 hour until the next meal.  Only take medicine as told by your doctor.  Do not skip meals. Eat on time.  Do not drink alcohol except with meals..  Always carry treatment with you, such as glucose pills.  Marland Kitchen.  .Marland Kitchen

## 2016-08-04 NOTE — Patient Instructions (Signed)
Please complete the stool cards and mail them in. Follow up in 3 months or sooner as needed

## 2016-08-04 NOTE — Progress Notes (Addendum)
   CC: diarrhea  HPI:  [name] is a 45 year old woman with history of DVT s/p 1 year of coumadin, migraines, hypothyroidism, bipolar disorder presenting for follow up of chronic diarrhea.  Last bowel movement was at 4:30AM. It woke her from her sleep. Started out formed. Bowel movement at 7:30am was diarrhea - watery, did not look fatty, did not float, no blood. No family history of celiac disease. She did try to avoid fatty foods. No abdominal pain. No nausea or vomiting. Ongoing issue for 7+ months. Comes and goes but this time has been more constant. No foreign travel. She has city water. No sick contacts. Mother, maternal grandfather and sister have chronic diarrhea. No mouth ulcers.   Past Medical History:  Diagnosis Date  . Bipolar 1 disorder (HCC)    Followed by Mental Health.  All psychiatric medications prescribed by Mental Health.  Stable for many years.    . Deep venous thrombosis of leg (HCC) 2012   completed year of coumadin   . Dyspareunia 03/04/11  . Hx of measles   . Hypothyroidism    Hypothyroidism since 8th grade.  Managed by Dr. Sharl MaKerr, Endocrinology.  On synthroid.  No prior thyroid surgery/ablation.   . Migraines   . Monilia infection 12/31/10  . Vaginal atrophy 03/04/11    Review of Systems:   No fevers, chills, night sweats, chest pain, or shortness of breath.  Physical Exam:  Vitals:   08/04/16 1455  BP: 106/67  Pulse: 64  Temp: 98.6 F (37 C)  TempSrc: Oral  SpO2: 96%  Weight: 231 lb 12.8 oz (105.1 kg)  Height: 5\' 4"  (1.626 m)   General Apperance: NAD HEENT: Normocephalic, atraumatic, anicteric sclera Neck: Supple, trachea midline Lungs: Clear to auscultation bilaterally. No wheezes, rhonchi or rales. Breathing comfortably Heart: Regular rate and rhythm, no murmur/rub/gallop Abdomen: Soft, nontender, nondistended, no rebound/guarding Extremities: Warm and well perfused, no edema Skin: No rashes or lesions Neurologic: Alert and  interactive. No gross deficits.   Assessment & Plan:   See Encounters Tab for problem based charting.  Patient discussed with Dr. Heide SparkNarendra

## 2016-08-04 NOTE — Progress Notes (Signed)
  Medical Nutrition Therapy:  Appt start time: 1408 end time:  1445. Visit # 1  Assessment:  Primary concerns today: weight loss.  Wendy OrleansMelanie wants to lose weight. She has already cut out her ice cream and started eating more vegetables, she is not ready to cut back on her regular soda consumption. She has a support system in friends and her mother and thinks she can lose weight on her own.  Preferred Learning Style: No preference indicated Learning Readiness: Ready and Change in progress  ANTHROPOMETRICS: BMI-39.74- class II obesity WEIGHT HISTORY:no previous weight loss attempts SLEEP:not assessed today MEDICATIONS: propanolol, Abilify, gabapentin can cause weight gain.  BLOOD SUGAR: reports has trouble with symptoms of low blood sugar when she doesn't eat enough DIETARY INTAKE: Usual eating pattern includes 3 meals and 1 snacks per day. Everyday foods include 20 ounces coke x2-3 per day, used to eat ice cream but recently stopped.   Avoided foods include eggs, tofu, red meat, fish except fried shrimp.   24-hr recall:  B ( AM): raisin bread with peanut butter, glass of milk L ( PM): vegetables with ranch dressing and glass of milk D ( PM): chicken, Mal[place]key, vegetables or salad Snacks are yogurt sweetened, coke, fruit or veggies with ranch Beverages: tea, coke 60 ounces per day ( 500-700 calories/day  Usual physical activity: watches stories with friends, walks to mailbox daily that is a short distance  Estimated daily energy needs: 1500-1600 calories for 1 # weight loss per week  Progress Towards Goal(s):  In progress.   Nutritional Diagnosis:  NB-1.1 Food and nutrition-related knowledge deficit As related to lack of prior sufficient training on meal planning  for weight loss.  As evidenced by her questions, inability to answer questions and report. .    Intervention:  Nutrition education on principles of weight loss, calories needs, encouraging higher nutrition density food choices.  . Coordination of care: consider alternative medications to Abilify, propanolol, gabapentin as these can cause weight gain  Teaching Method Utilized: Visual..Auditory,Hands on Handouts given during visit include: Barriers to learning/adherence to lifestyle change: depression , medicine that cause weight gain Demonstrated degree of understanding via:  Teach Back   Monitoring/Evaluation:  Dietary intake, exercise, and body weight prn.

## 2016-08-06 NOTE — Assessment & Plan Note (Addendum)
Assessment: She has tried avoiding high fat and spicy foods with no improvement in her symptoms. Renal function improved on CMP. LFTs unremarkable. Albumin within normal limits. ESR and CRP unremarkable.   Plan:  Stool occult blood pending Celiac testing pending Consider referral to GI  Addendum: celiac testing negative.

## 2016-08-07 ENCOUNTER — Other Ambulatory Visit: Payer: Self-pay | Admitting: Internal Medicine

## 2016-08-07 LAB — SEDIMENTATION RATE: SED RATE: 11 mm/h (ref 0–32)

## 2016-08-07 LAB — CMP14 + ANION GAP
ALBUMIN: 4 g/dL (ref 3.5–5.5)
ALK PHOS: 110 IU/L (ref 39–117)
ALT: 23 IU/L (ref 0–32)
AST: 22 IU/L (ref 0–40)
Albumin/Globulin Ratio: 1.4 (ref 1.2–2.2)
Anion Gap: 16 mmol/L (ref 10.0–18.0)
BILIRUBIN TOTAL: 0.3 mg/dL (ref 0.0–1.2)
BUN / CREAT RATIO: 12 (ref 9–23)
BUN: 15 mg/dL (ref 6–24)
CHLORIDE: 104 mmol/L (ref 96–106)
CO2: 24 mmol/L (ref 18–29)
Calcium: 9.4 mg/dL (ref 8.7–10.2)
Creatinine, Ser: 1.26 mg/dL — ABNORMAL HIGH (ref 0.57–1.00)
GFR calc Af Amer: 59 mL/min/{1.73_m2} — ABNORMAL LOW (ref >59)
GFR calc non Af Amer: 51 mL/min/{1.73_m2} — ABNORMAL LOW (ref >59)
GLUCOSE: 69 mg/dL (ref 65–99)
Globulin, Total: 2.9 g/dL (ref 1.5–4.5)
POTASSIUM: 4.1 mmol/L (ref 3.5–5.2)
Sodium: 144 mmol/L (ref 134–144)
Total Protein: 6.9 g/dL (ref 6.0–8.5)

## 2016-08-07 LAB — CELIAC AB TTG DGP TIGA
ANTIGLIADIN ABS, IGA: 7 U (ref 0–19)
Gliadin IgG: 2 units (ref 0–19)
IgA/Immunoglobulin A, Serum: 281 mg/dL (ref 87–352)
Tissue Transglut Ab: <2 U/mL (ref 0–5)
Transglutaminase IgA: <2 U/mL (ref 0–3)

## 2016-08-07 LAB — C-REACTIVE PROTEIN: CRP: 3.7 mg/L (ref 0.0–4.9)

## 2016-08-08 NOTE — Progress Notes (Signed)
Internal Medicine Clinic Attending  Case discussed with Dr. Krall soon after the resident saw the patient.  We reviewed the resident's history and exam and pertinent patient test results.  I agree with the assessment, diagnosis, and plan of care documented in the resident's note. 

## 2016-08-16 ENCOUNTER — Telehealth: Payer: Self-pay

## 2016-08-16 NOTE — Telephone Encounter (Signed)
Requesting lab result. 

## 2016-08-17 NOTE — Telephone Encounter (Signed)
Called pt, gave her your message, she would like to see a GI specialist, please send to your nurse and [facility] for referral, thanks

## 2016-08-17 NOTE — Telephone Encounter (Signed)
Could you please let her know that her kidney function improved from our last check and that the rest of her labs were normal. No inflammation and no celiac disease. I can put in a referral to GI for her diarrhea if she would be willing to go.  Thanks!

## 2016-08-18 NOTE — Addendum Note (Signed)
Addended by: Griffin BasilKRALL, JENNIFER T on: 08/18/2016 08:36 AM   Modules accepted: Orders

## 2016-08-31 ENCOUNTER — Ambulatory Visit (INDEPENDENT_AMBULATORY_CARE_PROVIDER_SITE_OTHER): Payer: Medicaid Other | Admitting: Internal Medicine

## 2016-08-31 ENCOUNTER — Encounter: Payer: Self-pay | Admitting: Internal Medicine

## 2016-08-31 VITALS — BP 116/72 | HR 68 | Temp 98.2°F | Ht 65.0 in | Wt 234.6 lb

## 2016-08-31 DIAGNOSIS — R197 Diarrhea, unspecified: Secondary | ICD-10-CM

## 2016-08-31 DIAGNOSIS — F17210 Nicotine dependence, cigarettes, uncomplicated: Secondary | ICD-10-CM | POA: Diagnosis not present

## 2016-08-31 DIAGNOSIS — K219 Gastro-esophageal reflux disease without esophagitis: Secondary | ICD-10-CM

## 2016-08-31 DIAGNOSIS — K529 Noninfective gastroenteritis and colitis, unspecified: Secondary | ICD-10-CM

## 2016-08-31 DIAGNOSIS — R1310 Dysphagia, unspecified: Secondary | ICD-10-CM | POA: Diagnosis not present

## 2016-08-31 MED ORDER — SUCRALFATE 1 GM/10ML PO SUSP: 1.0000 g | Freq: Four times a day (QID) | ORAL | 1 refills | Status: DC

## 2016-08-31 NOTE — Assessment & Plan Note (Addendum)
Assessment Over the last week, she has been experiencing burning epigastric pain which is associated with nausea. She is having difficulty swallowing solids and liquids with what she describes as a "choking sensation." She has had a prior endoscopy about 10 years ago which I am unable to find in our system. She has avoided spicy foods at the advice of her PCP without much relief in her symptoms. She continues to take pantoprazole 40 mg twice daily. She continues to smoke a pack a day and has done so for least 23 years but denies any current or past alcohol abuse.  Given her history of GERD and her ongoing tobacco use, I would be most concerned about a mass lesion.  Plan -Ordered barium swallow study for initial evaluation as we await status of her GI referral -Continue pantoprazole 40 mg twice daily -Prescribed Carafate solution to be taken up to 4 times daily and advised her that she may not have much relief with this medication if she does not have esophagitis to which she expressed understanding and would like to give it a trial -Follow-up with PCP in one month. I did not get a chance to address today, but smoking cessation would be in her interest for long-term control of her symptoms.

## 2016-08-31 NOTE — Progress Notes (Signed)
   CC: epigastric pain, diarrhea  HPI:  [name] is a 45 y.o. female who presents today for epigastric pain and diarrhea. Please see assessment & plan for status of chronic medical problems.   Past Medical History:  Diagnosis Date  . Bipolar 1 disorder (HCC)    Followed by Mental Health.  All psychiatric medications prescribed by Mental Health.  Stable for many years.    . Deep venous thrombosis of leg (HCC) 2012   completed year of coumadin   . Dyspareunia 03/04/11  . Hx of measles   . Hypothyroidism    Hypothyroidism since 8th grade.  Managed by Dr. Sharl MaKerr, Endocrinology.  On synthroid.  No prior thyroid surgery/ablation.   . Migraines   . Monilia infection 12/31/10  . Vaginal atrophy 03/04/11    Review of Systems:  Please see each problem below for a pertinent review of systems.   Physical Exam: Vitals:   08/31/16 1329  BP: 116/72  Pulse: 68  Temp: 98.2 F (36.8 C)  TempSrc: Oral  SpO2: 97%  Weight: 234 lb 9.6 oz (106.4 kg)  Height: 5\' 5"  (1.651 m)   Physical Exam  Constitutional: No distress.  HENT:  Head: Normocephalic and atraumatic.  Pulmonary/Chest: Effort normal. No respiratory distress.  Abdominal: Soft. Bowel sounds are normal. She exhibits no distension and no mass. There is tenderness (Mild tenderness overlying the bilateral lower quadrants). There is no rebound and no guarding.  Skin: She is not diaphoretic.     Assessment & Plan:   See Encounters Tab for problem based charting.  Patient discussed with Dr. Cyndie ChimeGranfortuna

## 2016-08-31 NOTE — Assessment & Plan Note (Signed)
Assessment She was last seen by her PCP on 08/04/16 at which time a [facility] with [facility]. Her celiac testing was unremarkable. She feels her diarrhea is actually worse. She feels she has to use restroom at least 5 times a day and at specific times a day, like before lunch, after dinner, at night when she is asleep. Her diarrhea is associated with abdominal cramping and bloating though she denies any blood in her stool. Her symptoms are somewhat relieved with Imodium that she takes 1-2 times a day.  At this point, further workup would be indicated with stool studies.  Plan -Recommended we proceed with stool studies today though she declined -Follow-up on referral to GI

## 2016-08-31 NOTE — Patient Instructions (Signed)
We will work on scheduling you a swallow test where we can see how things are going down.   Please try the carafate solution up to 4 times a day.   Please come back in 1 month to see your PCP, Dr. Isabella BowensKrall.

## 2016-09-01 ENCOUNTER — Encounter: Payer: Self-pay | Admitting: Gastroenterology

## 2016-09-01 NOTE — Progress Notes (Signed)
Medicine attending: Medical history, presenting problems, physical findings, and medications, reviewed with resident physician Dr Rushil Patel on the day of the patient visit and I concur with his evaluation and management plan. 

## 2016-09-06 ENCOUNTER — Telehealth: Payer: Self-pay | Admitting: Pulmonary Disease

## 2016-09-06 ENCOUNTER — Ambulatory Visit (HOSPITAL_COMMUNITY): Admission: RE | Admit: 2016-09-06 | Payer: Medicaid Other | Source: Ambulatory Visit

## 2016-09-06 NOTE — Telephone Encounter (Signed)
   Reason for call:   I received a call from Ms. Liston AlbaMelanie B Savini at 7 AM indicating she is having worse diarrhea that is not controlled by immodium.   Pertinent Data:   She reports that carafate prescribed upset her stomach.  Denies fevers, chills.   Assessment / Plan / Recommendations:   I told her she can try to use Pepto Bismol or kaopectate. She has an appointment with GI in January.   I asked her to call if her symptoms fail to improve or worsen.   As always, pt is advised that if symptoms worsen or new symptoms arise, they should go to an urgent care facility or to ER for further evaluation.   Lora PaulaJennifer T Toshika Parrow, MD   09/06/2016, 4:05 PM

## 2016-09-19 ENCOUNTER — Other Ambulatory Visit: Payer: Self-pay | Admitting: Pulmonary Disease

## 2016-09-28 ENCOUNTER — Telehealth: Payer: Self-pay

## 2016-09-28 NOTE — Telephone Encounter (Signed)
Needs to speak with a nurse regarding meds.  

## 2016-09-28 NOTE — Telephone Encounter (Signed)
Pt does not want to be seen, she would like you to call claritin in for itchy eyes, runny nose and sore throat, denies fevers, refuses appt

## 2016-09-29 NOTE — Telephone Encounter (Signed)
Called pt, she was agreeable

## 2016-09-29 NOTE — Telephone Encounter (Signed)
Could you please let her know that she can buy Claritin over the counter. Generic is loratidine. [facility] has 10 tablets for $2.48 and 45 tablets for $5.98. Thanks!

## 2016-10-05 ENCOUNTER — Ambulatory Visit (INDEPENDENT_AMBULATORY_CARE_PROVIDER_SITE_OTHER): Payer: Medicaid Other | Admitting: Pulmonary Disease

## 2016-10-05 ENCOUNTER — Encounter: Payer: Self-pay | Admitting: Pulmonary Disease

## 2016-10-05 ENCOUNTER — Encounter (INDEPENDENT_AMBULATORY_CARE_PROVIDER_SITE_OTHER): Payer: Self-pay

## 2016-10-05 DIAGNOSIS — R05 Cough: Secondary | ICD-10-CM

## 2016-10-05 DIAGNOSIS — J0100 Acute maxillary sinusitis, unspecified: Secondary | ICD-10-CM

## 2016-10-05 DIAGNOSIS — J3489 Other specified disorders of nose and nasal sinuses: Secondary | ICD-10-CM

## 2016-10-05 MED ORDER — HYDROCODONE-HOMATROPINE 5-1.5 MG/5ML PO SYRP: 5.0000 mL | ORAL_SOLUTION | Freq: Four times a day (QID) | ORAL | 0 refills | Status: DC | PRN

## 2016-10-05 MED ORDER — FLUTICASONE FUROATE 27.5 MCG/SPRAY NA SUSP: 2.0000 | Freq: Every day | NASAL | 1 refills | Status: DC

## 2016-10-05 NOTE — Patient Instructions (Signed)
Please follow up in 6-8 weeks for your regular follow up with your primary care provider. Please see us sooner if your symptoms fail to improve or if you worsen.  NASAL STEROID USE INSTRUCTIONS  Step 1. Prepare the nose. Blow the nose before administering the drug.  Step 2. Prime and activate the delivery device as recommended by the manufacturer.  Step 3. Position the head by tilting the head forward.  Step 4. Insert the tip of the applicator gently, avoiding contact with the septum.  Step 5. Aim the applicator tip about 45 from the floor of the nose and direct it at the outer corner of the eye on the same side to avoid traumatizing or spraying the septum.  Step 6. Close the other nostril gently with a finger.   Step 7. Sniff or inhale gently while delivering the drug.  BUFFERED ISOTONIC SALINE NASAL IRRIGATION  The Benefits:  1. When you irrigate, the isotonic saline (salt water) acts as a solvent and washes the mucus crusts and other debris from your nose.  2. This decongests and improves the airflow into your nose. The sinus passages begin to open.  3. Studies have also shown that a salt water and an alkaline (baking soda) irrigation solution improves nasal membrane cell function (mucociliary flow of mucus debris).  The Recipe:  1. Choose a 1-quart glass jar that is thoroughly cleansed.  2. Fill with sterile or distilled water, or you can boil water from the tap.  3. Add 1 to 2 heaping teaspoons of "pickling/canning/sea" salt (NOT table salt as it contains a large number of additives). This salt is available at the grocery store in the food canning section.  4. Add 1 teaspoon of Arm [employer] Hammer Baking Soda (pure bicarbonate).  5. Mix ingredients together and store at room temperature. Discard after one week. If you find this solution too strong, you may decrease the amount of salt added to 1 to 1  teaspoons. With children it is often best to start with a milder solution and  advance slowly. Irrigate with 240 ml (8 oz) twice daily.  The Instructions:  You should plan to irrigate your nose with buffered isotonic saline 2 times per day. Many people prefer to warm the solution slightly in the microwave - but be sure that the solution is NOT HOT. Stand over the sink (some do this in the shower) and squirt the solution into each side of your nose, keeping your mouth open. This allows you to spit the saltwater out of your mouth. It will not harm you if you swallow a little.  If you have been told to use a nasal steroid such as Flonase, Nasonex, or Nasacort, you should always use isortonic saline solution first, then use your nasal steroid product. The nasal steroid is much more effective when sprayed onto clean nasal membranes and the steroid medicine will reach deeper into the nose.  Most people experience a little burning sensation the first few times they use a isotonic saline solution, but this usually goes away within a few days.

## 2016-10-05 NOTE — Assessment & Plan Note (Addendum)
Assessment: Post nasal drip and sinus pain with cough for 7 days. Likely viral at this point.  Plan:  Saline rinses Flonase daily Hycodan cough syrup Return precautions discussed with patient.

## 2016-10-05 NOTE — Progress Notes (Deleted)
   CC: ***  HPI:  [name] is a 45 y.o.   Past Medical History:  Diagnosis Date  . Bipolar 1 disorder (HCC)    Followed by Mental Health.  All psychiatric medications prescribed by Mental Health.  Stable for many years.    . Deep venous thrombosis of leg (HCC) 2012   completed year of coumadin   . Dyspareunia 03/04/11  . Hx of measles   . Hypothyroidism    Hypothyroidism since 8th grade.  Managed by Dr. Sharl MaKerr, Endocrinology.  On synthroid.  No prior thyroid surgery/ablation.   . Migraines   . Monilia infection 12/31/10  . Vaginal atrophy 03/04/11    Review of Systems:   No nausea or vomiting No dysuria  Physical Exam:  Vitals:   10/05/16 1330  BP: 110/68  Pulse: (!) 56  Temp: 98.3 F (36.8 C)  TempSrc: Other (Comment)  SpO2: 99%  Weight: 232 lb 3.2 oz (105.3 kg)   ***  Assessment & Plan:   See Encounters Tab for problem based charting.  Patient {GC/GE:3044014::"discussed with","seen with"} Dr. {NAMES:3044014::"Butcher","Granfortuna","E. Hoffman","Klima","Mullen","Narendra","Vincent"}

## 2016-10-05 NOTE — Progress Notes (Signed)
   CC: cough  HPI:  [name] is a 45 y.o. presenting for evaluation of cough.  She [facility] her [facility] ago [facility]. Thursday, she started to have chest and head congestion, coughing. Her cough is nonproductive. She has popping in her ears. She has pain in her chest with the coughing. No sick contacts. Similar to episode last year in January. At that time she was prescribed cough medication.   No fevers or chills. She has sinus pain/pressure. No rhinorrhea. She has post nasal drip.    Past Medical History:  Diagnosis Date  . Bipolar 1 disorder (HCC)    Followed by Mental Health.  All psychiatric medications prescribed by Mental Health.  Stable for many years.    . Deep venous thrombosis of leg (HCC) 2012   completed year of coumadin   . Dyspareunia 03/04/11  . Hx of measles   . Hypothyroidism    Hypothyroidism since 8th grade.  Managed by Dr. Sharl MaKerr, Endocrinology.  On synthroid.  No prior thyroid surgery/ablation.   . Migraines   . Monilia infection 12/31/10  . Vaginal atrophy 03/04/11    Review of Systems:   No vision changes. No nausea/vomiting  Physical Exam:  Vitals:   10/05/16 1330  BP: 110/68  Pulse: (!) 56  Temp: 98.3 F (36.8 C)  TempSrc: Other (Comment)  SpO2: 99%  Weight: 232 lb 3.2 oz (105.3 kg)   General Apperance: NAD HEENT: Normocephalic, atraumatic, anicteric sclera. TM with mild erythema and mild effusion. No perforation. Nasal turbinates erythematous without obvious drainage. Neck: Supple, trachea midline Lungs: Clear to auscultation bilaterally. No wheezes, rhonchi or rales. Breathing comfortably Heart: Regular rate and rhythm, no murmur/rub/gallop Abdomen: Soft, nontender, nondistended, no rebound/guarding Extremities: Warm and well perfused, no edema Skin: No rashes or lesions Neurologic: Alert and interactive. No gross deficits.   Assessment & Plan:   See Encounters Tab for problem based  charting.  Patient discussed with Dr. Oswaldo DoneVincent

## 2016-10-06 NOTE — Progress Notes (Signed)
Internal Medicine Clinic Attending  Case discussed with Dr. Krall at the time of the visit.  We reviewed the resident's history and exam and pertinent patient test results.  I agree with the assessment, diagnosis, and plan of care documented in the resident's note.  

## 2016-10-11 ENCOUNTER — Telehealth: Payer: Self-pay

## 2016-10-11 NOTE — Telephone Encounter (Signed)
Needs to speak with a nurse regarding personal problem.  

## 2016-10-11 NOTE — Telephone Encounter (Signed)
Pt calls and states she is not having fevers but when she blows her nose there is "old, brown, mucousy blood" it is not constantly coming out, she is not weak or dizzy but is very concerned about this.  She was reassured and told that the md would be notified and she would be called also if it became uncontrollable and bright red to call 911, she was agreeable

## 2016-10-12 NOTE — Telephone Encounter (Signed)
Agreed. This can happen with sinusitis. She can try doing saline sinus rinses (instructions were given with AVS).

## 2016-10-13 NOTE — Telephone Encounter (Signed)
Lm for pt to rtc

## 2016-11-03 ENCOUNTER — Ambulatory Visit: Payer: Self-pay | Admitting: Gastroenterology

## 2016-11-08 ENCOUNTER — Other Ambulatory Visit: Payer: Self-pay | Admitting: Internal Medicine

## 2016-11-08 ENCOUNTER — Other Ambulatory Visit: Payer: Self-pay | Admitting: Pulmonary Disease

## 2016-11-10 ENCOUNTER — Other Ambulatory Visit: Payer: Self-pay | Admitting: Pulmonary Disease

## 2016-11-19 ENCOUNTER — Other Ambulatory Visit: Payer: Self-pay | Admitting: Internal Medicine

## 2016-11-22 ENCOUNTER — Other Ambulatory Visit: Payer: Self-pay | Admitting: Pulmonary Disease

## 2016-11-29 ENCOUNTER — Ambulatory Visit: Payer: Self-pay | Admitting: Gastroenterology

## 2016-12-01 ENCOUNTER — Encounter: Payer: Self-pay | Admitting: Pulmonary Disease

## 2016-12-02 ENCOUNTER — Other Ambulatory Visit: Payer: Self-pay

## 2016-12-02 NOTE — Telephone Encounter (Signed)
Asking to speak with a nurse regarding med. Please call back.

## 2016-12-02 NOTE — Telephone Encounter (Signed)
Talked to pt - requesting phenergan for nausea and does not want to come to the hospital. informed Compazine is on her med list. Wants refill on Compazine.

## 2016-12-02 NOTE — Telephone Encounter (Signed)
Talked to Dr Isabella BowensKrall - stated pt can try OTC med but if she wants a rx she has to be seen.  I called pt , informed of Dr Joaquin MusicKrall's response. Wanted to why the doctor cannot call in a rx. Informed again she needs to be evaluated.She said it's the stomach flu; throwing up all night and this morning.Told her she needs to come in - she said "never mind" and hung up the telephone.

## 2016-12-09 ENCOUNTER — Ambulatory Visit (INDEPENDENT_AMBULATORY_CARE_PROVIDER_SITE_OTHER): Payer: Self-pay | Admitting: Orthopaedic Surgery

## 2016-12-10 ENCOUNTER — Other Ambulatory Visit: Payer: Self-pay | Admitting: Pulmonary Disease

## 2016-12-18 ENCOUNTER — Other Ambulatory Visit: Payer: Self-pay | Admitting: Pulmonary Disease

## 2016-12-18 ENCOUNTER — Other Ambulatory Visit: Payer: Self-pay | Admitting: Internal Medicine

## 2016-12-19 ENCOUNTER — Other Ambulatory Visit: Payer: Self-pay | Admitting: Pulmonary Disease

## 2016-12-22 ENCOUNTER — Encounter: Payer: Self-pay | Admitting: Pulmonary Disease

## 2017-01-01 ENCOUNTER — Telehealth: Payer: Self-pay | Admitting: Internal Medicine

## 2017-01-01 MED ORDER — NYSTATIN 100000 UNIT/GM EX POWD: Freq: Four times a day (QID) | CUTANEOUS | 0 refills | Status: DC

## 2017-01-01 NOTE — Telephone Encounter (Signed)
.   INTERNAL MEDICINE RESIDENCY PROGRAM After-Hours Telephone Call    Reason for call:   I received a call from [name] at 8:01 PM, 01/01/2017 indicating she has redness and itching underneath her bilateral breasts. She states she has a history of fungal infection under breasts that she has been treated for in the past with nystatin powder. She states she is now out of this. She denies fever, chills, open sores, nausea or vomiting.    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   Patient voices understanding  As always, pt is advised that if symptoms worsen or new symptoms arise, they should go to an urgent care facility or to to ER for further evaluation.    Karlene LinemanAlexa Burns, DO PGY-3 Internal Medicine Resident Pager # [phone] 01/01/2017 8:04 PM

## 2017-01-05 ENCOUNTER — Other Ambulatory Visit: Payer: Self-pay | Admitting: Internal Medicine

## 2017-01-06 ENCOUNTER — Other Ambulatory Visit: Payer: Self-pay | Admitting: Internal Medicine

## 2017-01-12 NOTE — Telephone Encounter (Signed)
She should try to go back to once a day dosing. Thanks!

## 2017-02-07 ENCOUNTER — Ambulatory Visit (INDEPENDENT_AMBULATORY_CARE_PROVIDER_SITE_OTHER): Payer: Medicaid Other | Admitting: Internal Medicine

## 2017-02-07 ENCOUNTER — Encounter (INDEPENDENT_AMBULATORY_CARE_PROVIDER_SITE_OTHER): Payer: Self-pay

## 2017-02-07 VITALS — BP 111/71 | HR 60 | Temp 98.0°F | Ht 64.0 in | Wt 235.7 lb

## 2017-02-07 DIAGNOSIS — R21 Rash and other nonspecific skin eruption: Secondary | ICD-10-CM

## 2017-02-07 DIAGNOSIS — J309 Allergic rhinitis, unspecified: Secondary | ICD-10-CM | POA: Diagnosis not present

## 2017-02-07 DIAGNOSIS — R51 Headache: Secondary | ICD-10-CM

## 2017-02-07 DIAGNOSIS — J0100 Acute maxillary sinusitis, unspecified: Secondary | ICD-10-CM

## 2017-02-07 DIAGNOSIS — J3489 Other specified disorders of nose and nasal sinuses: Secondary | ICD-10-CM

## 2017-02-07 DIAGNOSIS — L300 Nummular dermatitis: Secondary | ICD-10-CM

## 2017-02-07 MED ORDER — LEVOFLOXACIN 500 MG PO TABS: 500.0000 mg | ORAL_TABLET | Freq: Every day | ORAL | 0 refills | Status: DC

## 2017-02-07 MED ORDER — ACETAMINOPHEN 500 MG PO TABS: 500.0000 mg | ORAL_TABLET | Freq: Four times a day (QID) | ORAL | 0 refills | Status: DC | PRN

## 2017-02-07 MED ORDER — SALINE SPRAY 0.65 % NA SOLN: 1.0000 | NASAL | 0 refills | Status: DC | PRN

## 2017-02-07 NOTE — Progress Notes (Signed)
   CC: Right sided headache and sinus drainage  HPI:  [name] is a 46 y.o. woman here today for a 2 week history of sinus drainage and pain now with severe right sided headache since Sunday.   See problem based assessment and plan below for additional details  Past Medical History:  Diagnosis Date  . Bipolar 1 disorder (HCC)    Followed by Mental Health.  All psychiatric medications prescribed by Mental Health.  Stable for many years.    . Deep venous thrombosis of leg (HCC) 2012   completed year of coumadin   . Dyspareunia 03/04/11  . Hx of measles   . Hypothyroidism    Hypothyroidism since 8th grade.  Managed by Dr. Sharl Ma, Endocrinology.  On synthroid.  No prior thyroid surgery/ablation.   . Migraines   . Monilia infection 12/31/10  . Vaginal atrophy 03/04/11    Review of Systems:  Review of Systems  Constitutional: Positive for chills. Negative for fever.  HENT: Positive for congestion and sinus pain. Negative for ear pain, hearing loss and sore throat.   Eyes: Negative for blurred vision.  Respiratory: Positive for cough. Negative for wheezing.   Gastrointestinal: Negative for diarrhea, nausea and vomiting.  Genitourinary: Negative for dysuria.  Skin: Positive for rash.    Physical Exam: Physical Exam  Constitutional: She is well-developed, well-nourished, and in no distress. No distress.  HENT:  Conjunctivae non-injected, TMs clear bilaterally, tenderness to palpation over the right sided maxillary and frontal sinuses, erythematous and edematous nasal turbinates  Cardiovascular: Normal rate and regular rhythm.   Pulmonary/Chest: Effort normal and breath sounds normal.  Musculoskeletal: She exhibits no edema.  Lymphadenopathy:    She has no cervical adenopathy.  Skin:  Approxiamtely 3cm diameter flat, faintly erythematous ring shaped rash on lateral left ankle 1cm diameter flat erythematous rash below left clavicle    Vitals:   02/07/17 1546  BP: 111/71    Pulse: 60  Temp: 98 F (36.7 C)  TempSrc: Oral  SpO2: 100%  Weight: 235 lb 11.2 oz (106.9 kg)  Height:  (1.626 m)    Assessment & Plan:   See Encounters Tab for problem based charting.  Patient discussed with Dr. Heide Spark

## 2017-02-07 NOTE — Patient Instructions (Signed)
It was a pleasure to see you today [name].  I think your symptoms are mostly due to a sinus infection. I prescribed Levaquin  once daily for 10 days to treat this. In the meantime your symptoms will also probably improve with using a saline nasal spray to rinse some of the mucous out of your airway. You can also take tylenol  up to every 6 hours as needed for headache or fevers while waiting for this to improve.  If your symptoms do not get better after this treatment please let us know right away otherwise we can see you as needed.

## 2017-02-08 ENCOUNTER — Other Ambulatory Visit: Payer: Self-pay | Admitting: Pulmonary Disease

## 2017-02-08 NOTE — Assessment & Plan Note (Signed)
HPI: She has a history of several previous sinus infections as well as chronic allergic rhinitis with use of saline, flonase, cetirizine although she is taking none of these at present. Her current worst symptom is headache in the area of tenderness over sinuses that was preceded by about 2 weeks of worsening sinus symptoms with green mucous and scant blood. This headache has not improved with taking her Maxalt which is generally effective for aborting migraines.  A: Acute sinusitis She does not have significant fevers but the duration and severity of symptoms suggest to me she would benefit with antibiotic treatment. Seems to involve maxillary and maybe frontal sinuses. She reports and has documented multiple adverse drug reactions so I will select an antibiotic that has been well tolerated in the past.  P: Levofloxacin  daily x10 days Nasal saline irrigation daily Acetaminophen  q6hrs PRN Instructed to return to clinic if symptoms were not improved by completion of treatment

## 2017-02-08 NOTE — Assessment & Plan Note (Signed)
This is not a new problem although one new site under her left clavicle appeared recently. Ideally this could benefit from a punch biopsy to evaluate although a problem such as nummular eczema seems most likely. Worsening of a new spot could be due to a current URI so I will treat this first before pursuing other workup of this mild skin rash.

## 2017-02-09 NOTE — Progress Notes (Signed)
Internal Medicine Clinic Attending  Case discussed with Dr. Rice at the time of the visit.  We reviewed the resident's history and exam and pertinent patient test results.  I agree with the assessment, diagnosis, and plan of care documented in the resident's note.  

## 2017-02-14 ENCOUNTER — Ambulatory Visit: Payer: Self-pay

## 2017-02-16 ENCOUNTER — Other Ambulatory Visit: Payer: Self-pay | Admitting: Pulmonary Disease

## 2017-02-17 ENCOUNTER — Telehealth: Payer: Self-pay

## 2017-02-17 NOTE — Telephone Encounter (Signed)
appt made for mon pm in [facility]

## 2017-02-17 NOTE — Telephone Encounter (Signed)
Want med for sinus infection. Please call pt back.

## 2017-02-20 ENCOUNTER — Telehealth: Payer: Self-pay | Admitting: *Deleted

## 2017-02-20 ENCOUNTER — Ambulatory Visit: Payer: Self-pay

## 2017-02-20 NOTE — Telephone Encounter (Signed)
Received call from patient stating there was an unexpected death of friend/family on 26-Mar-2023 night.  Pt is requesting something for her "nerves".  States she just needs something to "get me over the hump" "something short-term".  Will forward request to pcp for review. Please advise.Wendy Spittle Cassady4/23/20189:29 AM

## 2017-02-21 ENCOUNTER — Other Ambulatory Visit: Payer: Self-pay

## 2017-02-21 NOTE — Telephone Encounter (Signed)
Patient called back requesting something for anxiety states her niece died unexpectedly and she is having a hard time. Advised patient per Dr. Isabella Bowens she needs to go to [facility] for grief counseling or come to [facility] for visit  She will not prescibe anxiety medications at this time. Patient said thank you and hung up phone.

## 2017-02-21 NOTE — Telephone Encounter (Signed)
Please send her information on [facility] grief counseling [phone]) or she can walk in to [place].

## 2017-02-21 NOTE — Telephone Encounter (Signed)
Pt calling back for nurse to call

## 2017-02-21 NOTE — Telephone Encounter (Signed)
Requesting nerve med to be filled. Please call pt back.

## 2017-02-21 NOTE — Telephone Encounter (Signed)
I don't see that we have ever prescribed her Abilify and Buspar. Can you find out who prescribes those for her. She probably needs to go back and see psychiatry.

## 2017-02-22 NOTE — Telephone Encounter (Signed)
She was called, see other note

## 2017-02-27 ENCOUNTER — Other Ambulatory Visit: Payer: Self-pay | Admitting: Internal Medicine

## 2017-03-09 ENCOUNTER — Ambulatory Visit (INDEPENDENT_AMBULATORY_CARE_PROVIDER_SITE_OTHER): Payer: Medicaid Other | Admitting: Internal Medicine

## 2017-03-09 ENCOUNTER — Other Ambulatory Visit: Payer: Self-pay | Admitting: Pulmonary Disease

## 2017-03-09 ENCOUNTER — Encounter (INDEPENDENT_AMBULATORY_CARE_PROVIDER_SITE_OTHER): Payer: Self-pay

## 2017-03-09 ENCOUNTER — Encounter: Payer: Self-pay | Admitting: Internal Medicine

## 2017-03-09 DIAGNOSIS — R21 Rash and other nonspecific skin eruption: Secondary | ICD-10-CM | POA: Diagnosis present

## 2017-03-09 NOTE — Assessment & Plan Note (Signed)
Patient is here for evaluation of a chronic rash. Patient has two patches of dark and discolored skin, one over her left ankle and the other over her right heel. She reports that both patches started as small red bumps that have slowly grown and changed in color. She denies itching or pain. She has been applying over-the-counter hydrocortisone cream without relief. She is concerned about the spots cosmetically. -- Dermatology referral

## 2017-03-09 NOTE — Progress Notes (Signed)
   CC: Chronic rash   HPI:  [name] is a 46 y.o. female with past medical history outlined below here for chronic rash. For the details of today's visit, please refer to the assessment and plan.  Past Medical History:  Diagnosis Date  . Bipolar 1 disorder (HCC)    Followed by Mental Health.  All psychiatric medications prescribed by Mental Health.  Stable for many years.    . Deep venous thrombosis of leg (HCC) 2012   completed year of coumadin   . Dyspareunia 03/04/11  . Hx of measles   . Hypothyroidism    Hypothyroidism since 8th grade.  Managed by Dr. Sharl MaKerr, Endocrinology.  On synthroid.  No prior thyroid surgery/ablation.   . Migraines   . Monilia infection 12/31/10  . Vaginal atrophy 03/04/11    Review of Systems:  All pertinents listed in HPI, otherwise negative  Physical Exam:  Vitals:   03/09/17 1423  BP: 108/70  Weight: 235 lb 14.4 oz (107 kg)    Constitutional: NAD, appears comfortable Cardiovascular: RRR Pulmonary/Chest: CTAB Extremities: Warm and well perfused. No edema.  Neurological: A&Ox3, CN II - XII grossly intact.  Skin: Two patches of darkened discolored skin over her left ankle and right heel       Assessment & Plan:   See Encounters Tab for problem based charting.  Patient discussed with Dr. Heide SparkNarendra

## 2017-03-09 NOTE — Progress Notes (Signed)
Internal Medicine Clinic Attending  Case discussed with Dr. Guilloud at the time of the visit.  We reviewed the resident's history and exam and pertinent patient test results.  I agree with the assessment, diagnosis, and plan of care documented in the resident's note.  

## 2017-03-09 NOTE — Patient Instructions (Signed)
[name],  It was a pleasure to see you today. I have referred you to dermatology for your rash. You will be contacted for an appointment. If you have any questions or concerns, call our clinic at [phone] or after hours call [phone] and ask for the internal medicine resident on call. Thank you!  - Dr. Antony ContrasGuilloud

## 2017-04-04 ENCOUNTER — Other Ambulatory Visit: Payer: Self-pay | Admitting: Pulmonary Disease

## 2017-04-10 ENCOUNTER — Ambulatory Visit (INDEPENDENT_AMBULATORY_CARE_PROVIDER_SITE_OTHER): Payer: Self-pay | Admitting: Orthopaedic Surgery

## 2017-04-14 ENCOUNTER — Encounter: Payer: Self-pay | Admitting: *Deleted

## 2017-04-17 ENCOUNTER — Other Ambulatory Visit: Payer: Self-pay | Admitting: Pulmonary Disease

## 2017-04-23 ENCOUNTER — Other Ambulatory Visit: Payer: Self-pay | Admitting: Pulmonary Disease

## 2017-04-24 NOTE — Telephone Encounter (Signed)
Reported to be on for migraines.  Needs better documentation.

## 2017-05-04 ENCOUNTER — Other Ambulatory Visit: Payer: Self-pay | Admitting: Pulmonary Disease

## 2017-05-05 ENCOUNTER — Telehealth: Payer: Self-pay | Admitting: Internal Medicine

## 2017-05-05 ENCOUNTER — Other Ambulatory Visit: Payer: Self-pay | Admitting: *Deleted

## 2017-05-05 ENCOUNTER — Other Ambulatory Visit: Payer: Self-pay | Admitting: Pulmonary Disease

## 2017-05-05 NOTE — Telephone Encounter (Signed)
Refused to refill this earlier today.  Here is my note for that refill request: I reviewed Dr Elmer SowKralls last note in August 2017 that address her hypothyroidism. It appears she was previously followed by Dr Sharl MaKerr, however stopped being seen due to balance. Now we are following her hypothyroidism.  Per Dr Elmer SowKralls note she is to be on synthroid and not Cytomel, therefore I will refuse this fill.  Her last TSH was normal.

## 2017-05-05 NOTE — Telephone Encounter (Signed)
   Reason for call:   I received a call from [name] at 7:52 PM indicating that she needed a refill for her Cytomel.  She called clinic earlier today requesting the medication refill. Dr. Neita Chase's note from earlier today outlines that per PCP, Dr. Isabella Chase, saw her 05/2016 and addressed her hypothyroidism. Has been followed by Dr. Sharl Chase, endocrinology, in the past but was forced to stop going due to insurance issues. Dr. Joaquin Chase's note indicated that she is on Synthroid and is unaware of who has prescribed for the Cytomel. Note indicated that she would continue the Synthroid only. Last TSH 06/23/16 was normal.   Relayed this information to the patient and that I would not be able to fill the medication for her at this time. Recommended follow up in clinic on Monday for further evaluation. She became very upset. States that her mother has had two strokes since Mother's day and her dad is disabled; she can't just come to the clinic 'all the time.'    Pertinent Data:   Request for Cytomel refill. Patient not [facility] per chart review. Refuse request.    Assessment / Plan / Recommendations:   As above, recommended follow up in clinic   As always, pt is advised that if symptoms worsen or new symptoms arise, they should go to an urgent care facility or to to ER for further evaluation.   [name], Wendy Rowen, MD   05/05/2017, 8:39 PM

## 2017-05-05 NOTE — Telephone Encounter (Signed)
I reviewed Dr Elmer SowKralls last note in August 2017 that address her hypothyroidism. It appears she was previously followed by Dr Sharl MaKerr, however stopped being seen due to balance. Now we are following her hypothyroidism.  Per Dr Elmer SowKralls note she is to be on synthroid and not Cytomel, therefore I will refuse this fill.  Her last TSH was normal.

## 2017-05-31 ENCOUNTER — Other Ambulatory Visit: Payer: Self-pay

## 2017-05-31 MED ORDER — PANTOPRAZOLE SODIUM 40 MG PO TBEC: DELAYED_RELEASE_TABLET | ORAL | 3 refills | Status: DC

## 2017-05-31 NOTE — Telephone Encounter (Signed)
pantoprazole (PROTONIX) 40 MG tablet, refill request @ [facility] on [address].

## 2017-06-03 ENCOUNTER — Telehealth: Payer: Self-pay | Admitting: Internal Medicine

## 2017-06-03 NOTE — Telephone Encounter (Signed)
   Reason for call:   I received a call from [name] at 7 PM indicating that she was having a headache. The headache began yesterday evening and has not resolved since then. It is associated with tenderness over her left temporal lobe and photophobia. Last night she slept on and off, she was woken up twice with the headache.    Pertinent Data:   She tried taking rizatriptan and prochlorperazine, both twice today and the headache did not resolve.   Denies photophobia, changes in vison, weakness, jaw claudication, or fever.   She has a history of migraines which usually present as a soreness in her neck associated with phonophobia. It is not unusual for her to have migraines which do not respond to abortive therapy. This headache is different from her migraines which have never been associated with temporal tenderness or woken her up at night.   As the conversation continued [name] stated that she was having a lot of stress related to loosing a friend to drug overdose 2 months ago. She said that her headache was "easing off now" and feeling better than when we had begun talking.    Assessment / Plan / Recommendations:  Changes in this headache in comparison to the features of her chronic migraines is concerning. She has red flag features such as the headache waking her from sleep at night and concerning feature of temporal tenderness.   I advised [name] to come in to the ED for evaluation if her headache continues to wake her up this evening or becomes associated with new concerning features. She was begging to feel better towards the end of the conversation but understood the concerns. If the headache resolves she will follow up in the clinic next week.   As always, pt is advised that if symptoms worsen or new symptoms arise, they should go to an urgent care facility or to to ER for further evaluation.   Eulah PontBlum, Yaasir Menken, MD   06/03/2017, 7:56 PM

## 2017-06-08 ENCOUNTER — Telehealth: Payer: Self-pay

## 2017-06-08 NOTE — Telephone Encounter (Signed)
[facility]

## 2017-06-08 NOTE — Telephone Encounter (Signed)
Needs to speak with a nurse about something. Please call pt back.  

## 2017-06-09 ENCOUNTER — Other Ambulatory Visit: Payer: Self-pay

## 2017-06-09 MED ORDER — MECLIZINE HCL 25 MG PO TABS: ORAL_TABLET | ORAL | 0 refills | Status: DC

## 2017-06-09 NOTE — Telephone Encounter (Signed)
meclizine (ANTIVERT) 25 MG tablet, refill request @ [facility] on [address].

## 2017-06-13 ENCOUNTER — Ambulatory Visit: Payer: Self-pay

## 2017-06-20 ENCOUNTER — Ambulatory Visit (INDEPENDENT_AMBULATORY_CARE_PROVIDER_SITE_OTHER): Payer: Medicaid Other | Admitting: Internal Medicine

## 2017-06-20 VITALS — BP 121/76 | HR 97 | Temp 98.0°F | Ht 65.0 in | Wt 233.1 lb

## 2017-06-20 DIAGNOSIS — E039 Hypothyroidism, unspecified: Secondary | ICD-10-CM

## 2017-06-20 DIAGNOSIS — R21 Rash and other nonspecific skin eruption: Secondary | ICD-10-CM | POA: Diagnosis not present

## 2017-06-20 DIAGNOSIS — Z79899 Other long term (current) drug therapy: Secondary | ICD-10-CM | POA: Diagnosis not present

## 2017-06-20 MED ORDER — FLUOCINONIDE-E 0.05 % EX CREA: 1.0000 "application " | TOPICAL_CREAM | Freq: Two times a day (BID) | CUTANEOUS | 1 refills | Status: DC

## 2017-06-20 NOTE — Patient Instructions (Addendum)
It was nice to meet you today, [name].  I have sent a prescription for Lidex to your pharmacy. Please apply this cream 2 times a day in your hands as needed.  I have also placed a referral to dermatology. They will contact you for an appointment.  I will call you if results from your blood tests are abnormal.  Please call or make an appointment if your rash worsens or if you start to develop new symptoms.

## 2017-06-20 NOTE — Progress Notes (Signed)
   CC: Rash  HPI:  [name] is a 46 y.o. female with PMH listed below who presents to clinic for a rash in the palm of the hands. Please see problem based assessment and plan for further details.  Past Medical History:  Diagnosis Date  . Bipolar 1 disorder (HCC)    Followed by Mental Health.  All psychiatric medications prescribed by Mental Health.  Stable for many years.    . Deep venous thrombosis of leg (HCC) 2012   completed year of coumadin   . Dyspareunia 03/04/11  . Hx of measles   . Hypothyroidism    Hypothyroidism since 8th grade.  Managed by Dr. Sharl Ma, Endocrinology.  On synthroid.  No prior thyroid surgery/ablation.   . Migraines   . Monilia infection 12/31/10  . Vaginal atrophy 03/04/11   Review of Systems:   Review of Systems  Constitutional: Negative for chills and fever.  Musculoskeletal: Negative for joint pain and myalgias.  Skin: Positive for itching and rash.  Neurological: Negative for focal weakness.    Physical Exam:  Vitals:   06/20/17 1524  BP: 121/76  Pulse: 97  Temp: 98 F (36.7 C)  TempSrc: Oral  SpO2: 96%  Weight: 233 lb 1.6 oz (105.7 kg)  Height: 5\' 5"  (1.651 m)   General: Pleasant, overweight female, well-developed, in no acute distress HENT: NCAT, neck supple and FROM, OP clear without exudates or erythema, no oral lesions noted  Cardiac: regular rate and rhythm, nl S1/S2, no murmurs, rubs or gallops  Pulm: CTAB, no wheezes or crackles, no increased work of breathing  Derm: Scattered erythema throughout the palm of both hands associated with small papules scattered throughout fingers and finger webs, no blisters or pustular lesions noted (please see media tab for pictures), healed blister at the base of left thumb noted   Assessment & Plan:   See Encounters Tab for problem based charting.  Patient seen with Dr. Rogelia Boga

## 2017-06-20 NOTE — Assessment & Plan Note (Signed)
Patient presents with a rash on the palms of her hands that started on 8/16. States she was washing dishes she first noticed a rash. She describes it as burning, painful, and mildly itchy. She has also noticed small blisters that have opened up and drained white discharge. She has not noticed any other rashes or lesions on her body. States she has not high any new hand lotions or had any recent changes in medications. Lives at home with parents who have not experienced similar symptoms.  On exam, she has diffuse erythema over the palm of her hands and several, small papules on the fingers and at finger webs. 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 Contact dermatitis also in the differential given the patient was washing dishes at the time she noticed a rash. However she denies using new cleaning products. Unlikely to be scabies, would expect severe pruritus with this and possibly similar symptoms on family members who live with her. She has a history of nummular dermatitis however current lesions are not consistent with this.  - Fluocinonide-emollient (Lidex) 0.05% cream twice daily. Patient advised not to apply in face or other areas as this is a high potency topical steroid cream.  - Dermatology referral

## 2017-06-20 NOTE — Assessment & Plan Note (Signed)
Patient wanted her TSH checked today. Last TSH 1.6 on 05/2016. Currently on Synthroid mcg daily.   - TSH. Will call patient if results are abnormal and need to adjust Synthroid dose.

## 2017-06-21 ENCOUNTER — Telehealth: Payer: Self-pay | Admitting: *Deleted

## 2017-06-21 ENCOUNTER — Other Ambulatory Visit: Payer: Self-pay

## 2017-06-21 DIAGNOSIS — R21 Rash and other nonspecific skin eruption: Secondary | ICD-10-CM

## 2017-06-21 LAB — LIPID PANEL
CHOLESTEROL TOTAL: 151 mg/dL (ref 100–199)
Chol/HDL Ratio: 3.6 ratio (ref 0.0–4.4)
HDL: 42 mg/dL (ref >39)
LDL Calculated: 62 mg/dL (ref 0–99)
TRIGLYCERIDES: 236 mg/dL — AB (ref 0–149)
VLDL Cholesterol Cal: 47 mg/dL — ABNORMAL HIGH (ref 5–40)

## 2017-06-21 LAB — TSH: TSH: 4.04 u[IU]/mL (ref 0.450–4.500)

## 2017-06-21 NOTE — Telephone Encounter (Signed)
propranolol (INDERAL) 80 MG tablet, Refill request @ [facility].   Requesting PA on Lidex.

## 2017-06-21 NOTE — Telephone Encounter (Signed)
Needs PA on lidex

## 2017-06-21 NOTE — Telephone Encounter (Signed)
Pt is calling back for meds. Please call pt back. 

## 2017-06-22 ENCOUNTER — Telehealth: Payer: Self-pay | Admitting: Emergency Medicine

## 2017-06-22 MED ORDER — FLUTICASONE PROPIONATE 0.05 % EX CREA: TOPICAL_CREAM | Freq: Two times a day (BID) | CUTANEOUS | 1 refills | Status: DC

## 2017-06-22 MED ORDER — PROPRANOLOL HCL 80 MG PO TABS: 80.0000 mg | ORAL_TABLET | Freq: Three times a day (TID) | ORAL | 0 refills | Status: DC

## 2017-06-22 NOTE — Telephone Encounter (Signed)
Pt calls and wants something insurance covers for her fingers, called pharmacy and the lidex-e nor the reg lidex are not covered by medicaid.  Pt also states the rash is starting to have a foul odor-as something dead- to it, offered multiple appt times and days, pt refused Please order something else and will have pharmacist to run it through the system, pt is stating she has to have something asap

## 2017-06-22 NOTE — Telephone Encounter (Signed)
Pt would like to know if you are willing to accept her on as a patient. We currently see her mother, Loyal Jacobson.

## 2017-06-22 NOTE — Telephone Encounter (Signed)
Call to [facility].  Prior Authorization for fluocinonide -emollient creme approved 06/22/2017 thru 09/20/2017.  [facility] on [address] and patient were called and informed of.  Angelina Ok, RN 06/22/2017 4:53 PM.

## 2017-06-22 NOTE — Telephone Encounter (Signed)
Pt is aware.  

## 2017-06-22 NOTE — Telephone Encounter (Signed)
Has Medicaid and we do not accept medicaid

## 2017-06-23 NOTE — Progress Notes (Signed)
Internal Medicine Clinic Attending  I saw and evaluated the patient.  I personally confirmed the key portions of the history and exam documented by Dr. Santos and I reviewed pertinent patient test results.  The assessment, diagnosis, and plan were formulated together and I agree with the documentation in the resident's note. 

## 2017-06-30 ENCOUNTER — Telehealth: Payer: Self-pay | Admitting: Internal Medicine

## 2017-06-30 NOTE — Telephone Encounter (Signed)
Patient calling back about lab work.  

## 2017-06-30 NOTE — Telephone Encounter (Signed)
Called patient and discussed results. Patient voiced understanding.  No intervention needed at this time.

## 2017-07-06 ENCOUNTER — Other Ambulatory Visit: Payer: Self-pay | Admitting: Internal Medicine

## 2017-07-13 ENCOUNTER — Encounter: Payer: Self-pay | Admitting: Internal Medicine

## 2017-07-26 ENCOUNTER — Other Ambulatory Visit: Payer: Self-pay | Admitting: Internal Medicine

## 2017-07-26 NOTE — Telephone Encounter (Signed)
Pls call pt regarding medicine °

## 2017-07-27 MED ORDER — LEVOTHYROXINE SODIUM 100 MCG PO TABS: ORAL_TABLET | ORAL | 3 refills | Status: DC

## 2017-07-27 NOTE — Telephone Encounter (Signed)
Requesting refill on Levothyroxine. She did not want to re-schedule her appt w/PCP at this time.

## 2017-07-29 ENCOUNTER — Emergency Department (HOSPITAL_COMMUNITY)
Admission: EM | Admit: 2017-07-29 | Discharge: 2017-07-29 | Disposition: A | Discharge status: Home / Self Care | Payer: Medicaid Other | Attending: Emergency Medicine | Admitting: Emergency Medicine

## 2017-07-29 ENCOUNTER — Encounter (HOSPITAL_COMMUNITY): Payer: Self-pay | Admitting: Emergency Medicine

## 2017-07-29 ENCOUNTER — Telehealth: Payer: Self-pay | Admitting: Internal Medicine

## 2017-07-29 ENCOUNTER — Emergency Department (HOSPITAL_COMMUNITY): Payer: Medicaid Other

## 2017-07-29 DIAGNOSIS — E039 Hypothyroidism, unspecified: Secondary | ICD-10-CM | POA: Diagnosis not present

## 2017-07-29 DIAGNOSIS — Z79899 Other long term (current) drug therapy: Secondary | ICD-10-CM | POA: Insufficient documentation

## 2017-07-29 DIAGNOSIS — M79671 Pain in right foot: Secondary | ICD-10-CM | POA: Diagnosis present

## 2017-07-29 DIAGNOSIS — R52 Pain, unspecified: Secondary | ICD-10-CM

## 2017-07-29 DIAGNOSIS — I10 Essential (primary) hypertension: Secondary | ICD-10-CM | POA: Insufficient documentation

## 2017-07-29 DIAGNOSIS — L301 Dyshidrosis [pompholyx]: Secondary | ICD-10-CM | POA: Diagnosis not present

## 2017-07-29 DIAGNOSIS — F17210 Nicotine dependence, cigarettes, uncomplicated: Secondary | ICD-10-CM | POA: Insufficient documentation

## 2017-07-29 DIAGNOSIS — Z96651 Presence of right artificial knee joint: Secondary | ICD-10-CM | POA: Insufficient documentation

## 2017-07-29 DIAGNOSIS — M722 Plantar fascial fibromatosis: Secondary | ICD-10-CM | POA: Diagnosis not present

## 2017-07-29 MED ORDER — METHYLPREDNISOLONE 4 MG PO TBPK: ORAL_TABLET | ORAL | 0 refills | Status: DC

## 2017-07-29 NOTE — ED Triage Notes (Signed)
Pt states three weeks of left heel pain, states she could have a splinter or glass in foot. States she was on gravel. Nothing palpable on assessment. Pt also has notable rash to bilateral palms of hands, states she has had this for a week with no hx of same.

## 2017-07-29 NOTE — Telephone Encounter (Signed)
    Reason for call:   I received a call from Ms. Liston Alba at 5:50 PM indicating that she was just seen in the ER for left heel pain.   Pertinent Data:   She mentions that she is having left heel pain for 3 weeks, and was eval in the ER few hours ago. She received medrol pack for prescription. She is wanting tramadol for pain.   I discussed with the PA after Ms Cammy Copa who eval the patient and she was not given narcotics because they did not think it was necessary.    Assessment / Plan / Recommendations:   Explained to take OTC tylenol, and ibuprofen as needed. Patient says she has been able to tolerate ibuprofen without any allergic reaction but it does not help the pain  Her allergies are listed for aleve, codeine, morphine, sulfa, and meloxicam, so I am unable to give any pain medicines containing those ingredients.  Unable to give tramadol without eval the patient myself.   As always, pt is advised that if symptoms worsen or new symptoms arise, they should go to an urgent care facility or to to ER for further evaluation.   Deneise Lever, MD   07/29/2017, 6:00 PM

## 2017-07-29 NOTE — ED Notes (Signed)
Declined W/C at D/C and was escorted to lobby by RN. 

## 2017-07-29 NOTE — Discharge Instructions (Signed)
Contact a health care provider if: Your symptoms do not go away after treatment with home care measures. Your pain gets worse. Your pain affects your ability to move or do your daily activities. Contact a health care provider if: Your rash does not improve during the first week of treatment. Your rash is red or tender. Your rash has pus coming from it. Your rash spreads.

## 2017-07-29 NOTE — ED Provider Notes (Signed)
[facility]-EMERGENCY DEPT Provider Note   CSN: [id number] Arrival date & time: 07/29/17  1233     History   Chief Complaint Chief Complaint  Patient presents with  . Rash  . possible splinter    HPI [name] is a 46 y.o. female.He presents emergency Department with chief complaint of left heel pain. She states has been ongoing for about 3 weeks. Pain is worse in the morning when she takes her first step in the morning. She thought maybe she had a rock embedded in her foot since it seemed to begin after she walked in a gravel driveway without her shoes on. She denies any signs of infection, chills or fever. She has no previous injuries to the foot or ankle. Patient also complains of a palm on her rash which has been present for several weeks. She saw her PCP about it and has not yet seen a dermatologist. She does state that she washes her hands all the time. She denies any new lotions or soaps. It is localized to her palms. No other family members have the same symptoms. She has been using a steroid cream and has not improved much. She has no risk factors for STD or scabies.  HPI  Past Medical History:  Diagnosis Date  . Bipolar 1 disorder (HCC)    Followed by Mental Health.  All psychiatric medications prescribed by Mental Health.  Stable for many years.    . Deep venous thrombosis of leg (HCC) 2012   completed year of coumadin   . Dyspareunia 03/04/11  . Hx of measles   . Hypothyroidism    Hypothyroidism since 8th grade.  Managed by Dr. Sharl Ma, Endocrinology.  On synthroid.  No prior thyroid surgery/ablation.   . Migraines   . Monilia infection 12/31/10  . Vaginal atrophy 03/04/11    Patient Active Problem List   Diagnosis Date Noted  . Rash 03/09/2017  . Obesity 06/24/2016  . Musculoskeletal chest pain 06/15/2016  . Breast cyst 05/27/2016  . Cervical paraspinal muscle spasm 05/06/2016  . Situational anxiety 05/06/2016  . 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 Acute  right-sided low back pain without sciatica 01/13/2016  . 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 HTN (hypertension) 06/01/2015  . New onset of headaches 08/12/2014  . Chronic diarrhea 08/12/2014  . Migraine 08/12/2014  . Vertigo 05/21/2014  . Insomnia 03/11/2014  . Frequent falls 09/02/2013  . Preventative health care 08/16/2013  . Acute sinusitis 10/09/2012  . Tobacco abuse 08/14/2012  . Menopausal symptoms 03/16/2012  . History of DVT of lower extremity 03/16/2012  . HYPERCHOLESTEROLEMIA, PURE 04/10/2007  . RHINITIS, ALLERGIC NOS 04/10/2007  . GERD 04/10/2007  . Hypothyroidism 01/17/2007  . Bipolar I disorder, most recent episode depressed (HCC) 01/17/2007    Past Surgical History:  Procedure Laterality Date  . ABDOMINAL HYSTERECTOMY  2000  . APPENDECTOMY    . CESAREAN SECTION  1998  . CHOLECYSTECTOMY    . left elbow    . 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 TONSILLECTOMY    . WISDOM TOOTH EXTRACTION      OB History    Gravida Para Term Preterm AB Living   SAB TAB Ectopic Multiple Live Births                   Home Medications    Prior to Admission medications   Medication Sig Start Date  End Date Taking? Authorizing Provider  acetaminophen (TYLENOL) 500 MG tablet Take 1 tablet (500 mg total) by mouth every 6 (six) hours as needed. 02/07/17   Fuller Plan, MD  ARIPiprazole (ABILIFY) 30 MG tablet Take 30 mg by mouth at bedtime. 05/20/16   [provider]  buPROPion (WELLBUTRIN XL) 150 MG 24 hr tablet Take 150 mg by mouth every morning.  05/21/16   [provider]  busPIRone (BUSPAR) 10 MG tablet Take 10 mg by mouth 4 (four) times daily. 05/10/16   [provider]  busPIRone (BUSPAR) 15 MG tablet TK 1 T PO QID 02/07/17   [provider]  chlorhexidine (PERIDEX) 0.12 % solution TK UTD 02/17/17   [provider]  diclofenac sodium (VOLTAREN) 1 % GEL Apply 2 g topically 4 (four) times daily. 06/16/16    Nyra Market, MD  fluticasone (CUTIVATE) 0.05 % cream Apply topically 2 (two) times daily. 06/22/17   Santos-Sanchez, Chelsea Primus, MD  fluticasone (VERAMYST) 27.5 MCG/SPRAY nasal spray Place 2 sprays into the nose daily. 10/05/16   Lora Paula, MD  gabapentin (NEURONTIN) 300 MG capsule TAKE 1 CAPSULE(300 MG) BY MOUTH THREE TIMES DAILY FOR NEUROPATHIC PAIN 09/20/16   Lora Paula, MD  HYDROcodone-homatropine [facility]) 5-1.5 MG/5ML syrup Take 5 mLs by mouth every 6 (six) hours as needed for cough. 10/05/16   Lora Paula, MD  hydrOXYzine (VISTARIL) 25 MG capsule TK 1 C PO TID PRA 02/07/17   [provider]  levofloxacin (LEVAQUIN) 500 MG tablet Take 1 tablet (500 mg total) by mouth daily. 02/07/17   Rice, Jamesetta Orleans, MD  levothyroxine (SYNTHROID, LEVOTHROID) 100 MCG tablet TAKE 1 TABLET(100 MCG) BY MOUTH DAILY 07/27/17   Tyson Alias, MD  liothyronine (CYTOMEL) 5 MCG tablet TAKE 1 TABLET BY MOUTH DAILY 02/09/17   Lora Paula, MD  meclizine (ANTIVERT) 25 MG tablet TAKE 1 TABLET BY MOUTH TWICE DAILY AS NEEDED FOR DIZZINESS 07/10/17   Earl Lagos, MD  nystatin (MYCOSTATIN/NYSTOP) powder Apply topically 4 (four) times daily. 01/01/17   Burns, Tinnie Gens, MD  pantoprazole (PROTONIX) 40 MG tablet TAKE 1 TABLET(40 MG) BY MOUTH TWICE DAILY 30 MINUTES BEFORE BREAKFAST AND DINNER 05/31/17   Burns Spain, MD  prochlorperazine (COMPAZINE) 10 MG tablet TAKE 1 TABLET(10 MG) BY MOUTH EVERY 6 HOURS AS NEEDED FOR MIGRAINE HEADACHE 12/20/16   Lora Paula, MD  propranolol (INDERAL) 80 MG tablet Take 1 tablet (80 mg total) by mouth 3 (three) times daily. 06/22/17   Earl Lagos, MD  rizatriptan (MAXALT) 5 MG tablet TAKE 1 TABLET(5 MG) BY MOUTH 1 TIME AS NEEDED FOR MIGRAINE. MAY REPEAT IN 2 HOURS AS NEEDED 07/20/16   Lora Paula, MD  simvastatin (ZOCOR) 40 MG tablet TAKE 1 TABLET(40 MG) BY MOUTH EVERY MORNING 12/19/16   Lora Paula, MD  sodium chloride (OCEAN)  0.65 % SOLN nasal spray Place 1 spray into both nostrils as needed for congestion. 02/07/17   Rice, Jamesetta Orleans, MD  sucralfate (CARAFATE) 1 GM/10ML suspension Take 10 mLs (1 g total) by mouth 4 (four) times daily. 08/31/16 08/31/17  Beather Arbour, MD  traZODone (DESYREL) 100 MG tablet Take 1 tablet (100 mg total) by mouth at bedtime. For sleep 12/31/14   Rankin, Rada Hay, NP    Family History Family History  Problem Relation Age of Onset  . Other Father   . Cancer Maternal Aunt     Social History Social History  Substance Use  Topics  . Smoking status: Current Every Day Smoker    Packs/day: 1.00    Types: Cigarettes    Last attempt to quit: 10/22/2012  . Smokeless tobacco: Never Used     Comment: 0.5 ppd now - trying  to cut back and then out  . Alcohol use No     Allergies   Codeine; Morphine; Sulfonamide derivatives; Meloxicam; Morphine and related; and Naproxen   Review of Systems Review of Systems  Ten systems reviewed and are negative for acute change, except as noted in the HPI.   Physical Exam Updated Vital Signs BP (!) 127/94   Pulse 70   Temp 98.3 F (36.8 C)   Resp 18   SpO2 100%   Physical Exam  Constitutional: She is oriented to person, place, and time. She appears well-developed and well-nourished. No distress.  HENT:  Head: Normocephalic and atraumatic.  Eyes: Conjunctivae are normal. No scleral icterus.  Neck: Normal range of motion.  Cardiovascular: Normal rate, regular rhythm and normal heart sounds.  Exam reveals no gallop and no friction rub.   No murmur heard. Pulmonary/Chest: Effort normal and breath sounds normal. No respiratory distress.  Abdominal: Soft. Bowel sounds are normal. She exhibits no distension and no mass. There is no tenderness. There is no guarding.  Musculoskeletal:  Left foot without significant abnormality, normal DP and PT pulses bilaterally. No evidence of foreign body in soft tissue. Tender to palpation along the heel,  no swelling heat or redness.  Neurological: She is alert and oriented to person, place, and time.  Skin: Skin is warm and dry. She is not diaphoretic.  Bilateral hands with palmar erythema, multiple singular papulovesicular rash over the palms. No rashes elsewhere.  Psychiatric: Her behavior is normal.  Nursing note and vitals reviewed.    ED Treatments / Results  Labs (all labs ordered are listed, but only abnormal results are displayed) Labs Reviewed - No data to display  EKG  EKG Interpretation None       Radiology Dg Foot Complete Right  Result Date: 07/29/2017 CLINICAL DATA:  Rule out foreign body to right heel; Has had tenderness and pain for several weeks after walking barefoot outside EXAM: RIGHT FOOT COMPLETE - 3+ VIEW COMPARISON:  None. FINDINGS: There is no evidence of fracture or dislocation. No radiopaque foreign body or soft tissue gas. Note is made of a small plantar spur. IMPRESSION: No evidence for acute  abnormality. Electronically Signed   By: Norva Pavlov M.D.   On: 07/29/2017 14:20    Procedures Procedures (including critical care time)  Medications Ordered in ED Medications - No data to display   Initial Impression / Assessment and Plan / ED Course  I have reviewed the triage vital signs and the nursing notes.  Pertinent labs & imaging results that were available during my care of the patient were reviewed by me and considered in my medical decision making (see chart for details).     Patient with plantar fasciitis and what appears to be dyshidrotic dermatitis. She is a on steroid cream. We'll add an oral steroid, and anti-inflammatory and patient is to follow closely with podiatry and a dermatologist. She appears appropriate for discharge at this time. Final Clinical Impressions(s) / ED Diagnoses   Final diagnoses:  Pain    New Prescriptions New Prescriptions   No medications on file     Arthor Captain, PA-C 07/29/17 1701    Charlynne Pander, MD 07/29/17 [phone]

## 2017-08-06 ENCOUNTER — Other Ambulatory Visit: Payer: Self-pay | Admitting: Internal Medicine

## 2017-08-07 NOTE — Telephone Encounter (Signed)
On meclizine for many years and taking about 2 a day - every day. Pt really only has had acute appts rather than CC for chronic med mgmt. On a lot of PRN, sx based meds 1. Will ask Dr if her team can do med rec - what is she filling and how often? 2. Would like an appt for med rec and mgmt, inc why meclizine BID?

## 2017-08-09 ENCOUNTER — Ambulatory Visit (INDEPENDENT_AMBULATORY_CARE_PROVIDER_SITE_OTHER): Payer: Medicaid Other | Admitting: Podiatry

## 2017-08-09 ENCOUNTER — Encounter: Payer: Self-pay | Admitting: Podiatry

## 2017-08-09 DIAGNOSIS — M722 Plantar fascial fibromatosis: Secondary | ICD-10-CM

## 2017-08-09 MED ORDER — DICLOFENAC SODIUM 75 MG PO TBEC: 75.0000 mg | DELAYED_RELEASE_TABLET | Freq: Two times a day (BID) | ORAL | 0 refills | Status: DC

## 2017-08-14 ENCOUNTER — Other Ambulatory Visit: Payer: Self-pay

## 2017-08-14 ENCOUNTER — Telehealth: Payer: Self-pay | Admitting: Podiatry

## 2017-08-14 NOTE — Progress Notes (Signed)
Error

## 2017-08-14 NOTE — Telephone Encounter (Signed)
I saw Dr. Logan Bores last Wednesday and he prescribed me some medication that needed prior-authorization because of my Medicaid. I was calling to check the status of that because I'm in a lot of pain. You can call me back at [phone]. Thank you.

## 2017-08-15 ENCOUNTER — Telehealth: Payer: Self-pay | Admitting: *Deleted

## 2017-08-15 ENCOUNTER — Other Ambulatory Visit: Payer: Self-pay | Admitting: Podiatry

## 2017-08-15 ENCOUNTER — Telehealth: Payer: Self-pay | Admitting: Podiatry

## 2017-08-15 MED ORDER — IBUPROFEN 800 MG PO TABS: 800.0000 mg | ORAL_TABLET | Freq: Three times a day (TID) | ORAL | 0 refills | Status: DC | PRN

## 2017-08-15 NOTE — Telephone Encounter (Signed)
I informed pt of Dr. Logan Bores orders for Ibuprofen.

## 2017-08-15 NOTE — Telephone Encounter (Deleted)
-----   Message from Marylou Mccoy, RN sent at 08/15/2017 10:27 AM EDT ----- MRI wo contrast Lt foot, rear foot PT tendon traumatic tear, flat foot deformity.  I would put all that in but Im juggling things at the moment, thank you.

## 2017-08-15 NOTE — Progress Notes (Signed)
   Subjective: Patient presents today for pain and tenderness in the right plantar heel and arch that began 2 months ago. She reports hearing an associated popping noise when she walks. Patient states that it hurts in the mornings with the first steps out of bed. She states she was seen in the ED on 07/29/17, had an X-Ray and was prescribed a Medrol Dose Pak. Patient presents today for further treatment and evaluation.   Past Medical History:  Diagnosis Date  . Bipolar 1 disorder (HCC)    Followed by Mental Health.  All psychiatric medications prescribed by Mental Health.  Stable for many years.    . Deep venous thrombosis of leg (HCC) 2012   completed year of coumadin   . Dyspareunia 03/04/11  . Hx of measles   . Hypothyroidism    Hypothyroidism since 8th grade.  Managed by Dr. Sharl Ma, Endocrinology.  On synthroid.  No prior thyroid surgery/ablation.   . Migraines   . Monilia infection 12/31/10  . Vaginal atrophy 03/04/11     Objective: Physical Exam General: The patient is alert and oriented x3 in no acute distress.  Dermatology: Skin is warm, dry and supple bilateral lower extremities. Negative for open lesions or macerations bilateral.   Vascular: Dorsalis Pedis and Posterior Tibial pulses palpable bilateral.  Capillary fill time is immediate to all digits.  Neurological: Epicritic and protective threshold intact bilateral.   Musculoskeletal: Tenderness to palpation at the medial calcaneal tubercale and through the insertion of the plantar fascia of the right foot. All other joints range of motion within normal limits bilateral. Strength 5/5 in all groups bilateral.    Assessment: 1. Plantar fasciitis right 2. Pain in right foot  Plan of Care:  1. Patient evaluated. Xrays reviewed.   2. Injection of 0.5cc Celestone soluspan injected into the right plantar fascia  3. Rx for Diclofenac  PO BID ordered for patient. 4. Plantar fascial band(s) dispensed 5. Instructed patient  regarding therapies and modalities at home to alleviate symptoms.  6. Return to clinic in 4 weeks.     Felecia Shelling, DPM [facility]  Dr. Felecia Shelling, DPM    [address] [place], [place] [address]                Office [phone]  Fax [phone]

## 2017-08-15 NOTE — Telephone Encounter (Signed)
I called yesterday about the prior authorization for the Voltaren for my pain because its not covered by Medicaid. I need something for pain please as I have not had anything for the pain or swelling. Everything is wearing off including the shots. Please call me back at [phone]. Thanks.

## 2017-08-15 NOTE — Telephone Encounter (Signed)
Delydia I did not have a chance to get to this.

## 2017-08-15 NOTE — Addendum Note (Signed)
Addended by: Alphia Kava D on: 08/15/2017 01:22 PM   Modules accepted: Orders

## 2017-08-15 NOTE — Telephone Encounter (Signed)
Yes, thank you.

## 2017-08-16 NOTE — Telephone Encounter (Signed)
ENTERED IN ERROR

## 2017-08-16 NOTE — Telephone Encounter (Deleted)
-----   Message from Marylou MccoyJessica L Quintana, RN sent at 08/15/2017 10:27 AM EDT ----- MRI wo contrast Lt foot, rear foot PT tendon traumatic tear, flat foot deformity.  I would put all that in but Im juggling things at the moment, thank you

## 2017-08-23 ENCOUNTER — Telehealth: Payer: Self-pay | Admitting: *Deleted

## 2017-08-23 NOTE — Telephone Encounter (Signed)
Pt states the ibuprofen 800mg  is making her sick. I told pt I would contact Dr. Logan BoresEvans and call again, but to stop the ibuprofen and get an appt.

## 2017-08-24 ENCOUNTER — Ambulatory Visit: Payer: Self-pay | Admitting: Pharmacist

## 2017-08-25 ENCOUNTER — Telehealth: Payer: Self-pay | Admitting: Podiatry

## 2017-08-25 MED ORDER — TRAMADOL HCL 50 MG PO TABS: 50.0000 mg | ORAL_TABLET | Freq: Four times a day (QID) | ORAL | 0 refills | Status: DC | PRN

## 2017-08-25 NOTE — Telephone Encounter (Signed)
Pt called again stating she is having a lot of pain in her foot and needs pain medication. She is scheduled to see Dr Logan BoresEvans on 10.29.18 but states she cannot make it thru the weekend without any pain medication and the ibuprofen made her deathly sick.

## 2017-08-25 NOTE — Telephone Encounter (Signed)
I told pt Dr. Logan BoresEvans okayed Tramadol 50mg  #30 one tablet every 6 hours prn pain. Pt states understanding. [facility] [id number]

## 2017-08-28 ENCOUNTER — Ambulatory Visit (INDEPENDENT_AMBULATORY_CARE_PROVIDER_SITE_OTHER): Payer: Medicaid Other | Admitting: Podiatry

## 2017-08-28 DIAGNOSIS — M722 Plantar fascial fibromatosis: Secondary | ICD-10-CM

## 2017-08-28 MED ORDER — MELOXICAM 15 MG PO TABS: 15.0000 mg | ORAL_TABLET | Freq: Every day | ORAL | 1 refills | Status: AC

## 2017-08-28 MED ORDER — BETAMETHASONE SOD PHOS & ACET 6 (3-3) MG/ML IJ SUSP: 3.0000 mg | Freq: Once | INTRAMUSCULAR | Status: DC

## 2017-08-30 NOTE — Progress Notes (Signed)
   Subjective: Patient presents today for follow up evaluation of right plantar fasciitis. She reports continued constant pain and states it is unchanged from the last visit. She has been taking tramadol as directed with some temporary relief. Wearing the fascial brace, icing and exercising the foot does not help alleviate the pain. Patient presents today for further treatment and evaluation.   Past Medical History:  Diagnosis Date  . Bipolar 1 disorder (HCC)    Followed by Mental Health.  All psychiatric medications prescribed by Mental Health.  Stable for many years.    . Deep venous thrombosis of leg (HCC) 2012   completed year of coumadin   . Dyspareunia 03/04/11  . Hx of measles   . Hypothyroidism    Hypothyroidism since 8th grade.  Managed by Dr. Sharl MaKerr, Endocrinology.  On synthroid.  No prior thyroid surgery/ablation.   . Migraines   . Monilia infection 12/31/10  . Vaginal atrophy 03/04/11     Objective: Physical Exam General: The patient is alert and oriented x3 in no acute distress.  Dermatology: Skin is warm, dry and supple bilateral lower extremities. Negative for open lesions or macerations bilateral.   Vascular: Dorsalis Pedis and Posterior Tibial pulses palpable bilateral.  Capillary fill time is immediate to all digits.  Neurological: Epicritic and protective threshold intact bilateral.   Musculoskeletal: Tenderness to palpation at the medial calcaneal tubercale and through the insertion of the plantar fascia of the right foot. All other joints range of motion within normal limits bilateral. Strength 5/5 in all groups bilateral.    Assessment: 1. Plantar fasciitis right 2. Pain in right foot  Plan of Care:  1. Patient evaluated.  2. Injection of 0.5cc Celestone soluspan injected into the right plantar fascia  3. Continue wearing plantar fascial brace. 4. Prescription for meloxicam given to patient. Medicaid would not pay for Diclofenac.  5. Recommended good shoe  gear.  6. Continue taking Tramadol as needed. 7. Return to clinic in 4 weeks.      Felecia ShellingBrent M. Stryder Poitra, DPM [facility]  Dr. Felecia ShellingBrent M. Alyiah Ulloa, DPM    [address]                                        [place], [place] [address]                Office [phone]  Fax [phone]

## 2017-09-04 ENCOUNTER — Other Ambulatory Visit: Payer: Self-pay | Admitting: Podiatry

## 2017-09-04 ENCOUNTER — Telehealth: Payer: Self-pay | Admitting: Internal Medicine

## 2017-09-05 NOTE — Telephone Encounter (Signed)
Called patient's home, a female picked up & patient not at home. left message for patient to call back tomorrow to speak to the nurse.  (Regarding Meclizine refill request) Refer to MD note.

## 2017-09-05 NOTE — Telephone Encounter (Signed)
Left message informing pt of Dr. Logan BoresEvans orders to refill the Tramadol and [phone]

## 2017-09-05 NOTE — Telephone Encounter (Signed)
What is the status of her follow up appointment? In my review of her records the issue of vertigo has not been readdressed in recent visits.  Dr Rogelia BogaButcher noticed this a month ago with the refill on 08/06/17 and asked that she be scheduled for a visit to address this as well as a medication reconciliation.

## 2017-09-06 ENCOUNTER — Ambulatory Visit: Payer: Medicaid Other | Admitting: Podiatry

## 2017-09-06 MED ORDER — RIZATRIPTAN BENZOATE 5 MG PO TABS: ORAL_TABLET | ORAL | 0 refills | Status: DC

## 2017-09-06 NOTE — Telephone Encounter (Signed)
Received incoming call from patient-states her mother had a recent stroke and it will be about another 3 weeks before she will be able to leave her side. Pt states the meclizine helps with her ongoing vertigo.  She is also requesting a refill on her rizatriptan.  Pt informed that I will send request to MD for review.  Please advise.   Pt also states she will make an appt to be seen within the next 30 days.Criss AlvineGoldston, Fotios Amos Cassady11/7/20189:49 AM

## 2017-09-06 NOTE — Telephone Encounter (Signed)
Provided 1 month refill, but she will need to be seen before further refills are provided to have the need for these medications addressed.

## 2017-09-06 NOTE — Telephone Encounter (Signed)
Per dr Mikey Bussinghoffman, pt needs appt asap

## 2017-09-08 NOTE — Telephone Encounter (Signed)
Patient has cancelled last two appointments with Dr. Renaldo ReelHuang on 07-13-2017 and 10-12-2017 stating she did want the appointments.  Forwarding message to Tyler AasDoris so she can speak with patient about the importance of keeping her appointments with her primary.

## 2017-09-16 ENCOUNTER — Other Ambulatory Visit: Payer: Self-pay | Admitting: Internal Medicine

## 2017-09-16 DIAGNOSIS — G43009 Migraine without aura, not intractable, without status migrainosus: Secondary | ICD-10-CM

## 2017-09-16 DIAGNOSIS — I10 Essential (primary) hypertension: Secondary | ICD-10-CM

## 2017-09-18 ENCOUNTER — Telehealth: Payer: Self-pay | Admitting: Podiatry

## 2017-09-18 MED ORDER — TRAMADOL HCL 50 MG PO TABS: 50.0000 mg | ORAL_TABLET | Freq: Four times a day (QID) | ORAL | 0 refills | Status: DC | PRN

## 2017-09-18 NOTE — Telephone Encounter (Signed)
I'm calling to request a refill of the Tramadol. I left a message at 8:43 am but I don't know who's voicemail I left the message on. I would like to get this refilled today because I've been without it through the weekend. My number is [phone]. Thank you.

## 2017-09-18 NOTE — Telephone Encounter (Addendum)
I informed pt Dr. Logan BoresEvans would fill the Tramadol, and she would need to keep her 09/25/2017 appt. Pt states understanding. [facility]

## 2017-09-18 NOTE — Addendum Note (Signed)
Addended by: Alphia Kava'CONNELL, Mahina Salatino D on: 09/18/2017 03:57 PM   Modules accepted: Orders

## 2017-09-19 NOTE — Telephone Encounter (Signed)
Spoke with patient and she is scheduled for 09/28/17 at 2:45 PM.  States she cannot come on Fridays. She has been RA to Dr. Caron PresumeHelberg who has Thursday Clinics.

## 2017-09-25 ENCOUNTER — Ambulatory Visit: Payer: Medicaid Other | Admitting: Podiatry

## 2017-09-28 ENCOUNTER — Ambulatory Visit: Payer: Self-pay

## 2017-09-28 ENCOUNTER — Encounter: Payer: Self-pay | Admitting: Internal Medicine

## 2017-10-12 ENCOUNTER — Encounter: Payer: Self-pay | Admitting: Internal Medicine

## 2017-10-17 ENCOUNTER — Ambulatory Visit: Payer: Self-pay

## 2017-10-20 ENCOUNTER — Telehealth: Payer: Self-pay | Admitting: *Deleted

## 2017-10-20 MED ORDER — TRAMADOL HCL 50 MG PO TABS: 50.0000 mg | ORAL_TABLET | Freq: Four times a day (QID) | ORAL | 0 refills | Status: DC | PRN

## 2017-10-20 NOTE — Telephone Encounter (Addendum)
Pt states her foot is starting to act up again and she would like refill of the tramadol. Dr. Philomena DohenyEvans okayed refill of the Tramadol as previously written, but no further refills until seen. [facility]

## 2017-10-30 ENCOUNTER — Ambulatory Visit: Payer: Medicaid Other | Admitting: Podiatry

## 2017-10-30 ENCOUNTER — Encounter: Payer: Self-pay | Admitting: Podiatry

## 2017-10-30 DIAGNOSIS — M722 Plantar fascial fibromatosis: Secondary | ICD-10-CM

## 2017-11-01 ENCOUNTER — Other Ambulatory Visit: Payer: Self-pay

## 2017-11-01 ENCOUNTER — Other Ambulatory Visit: Payer: Self-pay | Admitting: Internal Medicine

## 2017-11-01 NOTE — Telephone Encounter (Signed)
gabapentin (NEURONTIN) 300 MG capsule, Refill request @ [facility] on [address].

## 2017-11-01 NOTE — Telephone Encounter (Signed)
Patient requesting refill Compazine

## 2017-11-02 ENCOUNTER — Other Ambulatory Visit: Payer: Self-pay

## 2017-11-02 ENCOUNTER — Ambulatory Visit: Payer: Medicaid Other | Admitting: Internal Medicine

## 2017-11-02 VITALS — BP 103/73 | HR 64 | Temp 98.1°F | Ht 65.0 in | Wt 232.4 lb

## 2017-11-02 DIAGNOSIS — Z79890 Hormone replacement therapy (postmenopausal): Secondary | ICD-10-CM | POA: Diagnosis not present

## 2017-11-02 DIAGNOSIS — I10 Essential (primary) hypertension: Secondary | ICD-10-CM | POA: Diagnosis not present

## 2017-11-02 DIAGNOSIS — F17210 Nicotine dependence, cigarettes, uncomplicated: Secondary | ICD-10-CM

## 2017-11-02 DIAGNOSIS — R42 Dizziness and giddiness: Secondary | ICD-10-CM

## 2017-11-02 DIAGNOSIS — E039 Hypothyroidism, unspecified: Secondary | ICD-10-CM | POA: Diagnosis not present

## 2017-11-02 DIAGNOSIS — G43909 Migraine, unspecified, not intractable, without status migrainosus: Secondary | ICD-10-CM

## 2017-11-02 DIAGNOSIS — Z79899 Other long term (current) drug therapy: Secondary | ICD-10-CM

## 2017-11-02 DIAGNOSIS — Z90710 Acquired absence of both cervix and uterus: Secondary | ICD-10-CM | POA: Diagnosis not present

## 2017-11-02 DIAGNOSIS — Z0000 Encounter for general adult medical examination without abnormal findings: Secondary | ICD-10-CM

## 2017-11-02 DIAGNOSIS — R002 Palpitations: Secondary | ICD-10-CM | POA: Diagnosis not present

## 2017-11-02 DIAGNOSIS — F319 Bipolar disorder, unspecified: Secondary | ICD-10-CM | POA: Diagnosis not present

## 2017-11-02 MED ORDER — GABAPENTIN 300 MG PO CAPS: 300.0000 mg | ORAL_CAPSULE | Freq: Three times a day (TID) | ORAL | 6 refills | Status: DC

## 2017-11-02 MED ORDER — MECLIZINE HCL 25 MG PO TABS: ORAL_TABLET | ORAL | 0 refills | Status: DC

## 2017-11-02 NOTE — Assessment & Plan Note (Signed)
Assessment Reports symptoms of spinning which mostly occur when she stands up too quickly. She has been on meclizine since 2015 and she reports this relieves her episodes of vertigo.  Plan - Refilled meclizine 25mg  daily

## 2017-11-02 NOTE — Assessment & Plan Note (Signed)
Influenza Patient states she does not want the flu vaccine today because she states that she has heard that the flu vaccine can give her the flu. I educated the patient and provided information regarding the benefits of yearly influenza vaccination. - Advised patient to RTC or to go to a pharmacy if she decides she wants a flu vaccine - Provided information to patient regarding benefits of flu vaccine  PAP Smear S/p hysterectomy. Last PAP smear in 2015 with Dr. Pennie RushingHaygood. She is due for another one. She prefers to return to Dr. Pennie RushingHaygood for her PAP smear. - Encouraged patient to call Dr. Pennie RushingHaygood to set up an appointment for PAP smear.

## 2017-11-02 NOTE — Patient Instructions (Addendum)
FOLLOW-UP INSTRUCTIONS When: 3 months For: BP and hypothyroidism management What to bring: medications   [name],  It was a pleasure to meet you today.  We are checking your thyroid level today. I will call you if the result is abnormal.  I have refilled your gabapentin and meclizine.  Please be sure to call Dr. Lilian Coma office to get a PAP smear done.  Please call our clinic if you would like a flu shot or you can go to any pharmacy and get a flu shot.  Please return to our clinic in 3 months.    Preventing Influenza, Adult Influenza, more commonly known as "the flu," is a viral infection that mainly affects the respiratory tract. The respiratory tract includes structures that help you breathe, such as the lungs, nose, and throat. The flu causes many common cold symptoms, as well as a high fever and body aches. The flu spreads easily from person to person (is contagious). The flu is most common from December through March. This is called flu season.You can catch the flu virus by:  Breathing in droplets from an infected person's cough or sneeze.  Touching something that was recently contaminated with the virus and then touching your mouth, nose, or eyes.  What can I do to lower my risk? You can decrease your risk of getting the flu by:  Getting a flu shot (influenza vaccination) every year. This is the best way to prevent the flu. A flu shot is recommended for everyone age 31 months and older. ? It is best to get a flu shot in the fall, as soon as it is available. Getting a flu shot during winter or spring instead is still a good idea. Flu season can last into early spring. ? Preventing the flu through vaccination requires getting a new flu shot every year. This is because the flu virus changes slightly (mutates) from one year to the next. Even if a flu shot does not completely protect you from all flu virus mutations, it can reduce the severity of your illness and prevent  dangerous complications of the flu. ? If you are pregnant, you can and should get a flu shot. ? If you have had a reaction to the shot in the past or if you are allergic to eggs, check with your health care provider before getting a flu shot. ? Sometimes the vaccine is available as a nasal spray. In some years, the nasal spray has not been as effective against the flu virus. Check with your health care provider if you have questions about this.  Practicing good health habits. This is especially important during flu season. ? Avoid contact with people who are sick with flu or cold symptoms. ? Wash your hands with soap and water often. If soap and water are not available, use hand sanitizer. ? Avoid touching your hands to your face, especially when you have not washed your hands recently. ? Use a disinfectant to clean surfaces at home and at work that may be contaminated with the flu virus. ? Keep your body's disease-fighting system (immune system) in good shape by eating a healthy diet, drinking plenty of fluids, getting enough sleep, and exercising regularly.  If you do get the flu, avoid spreading it to others by:  Staying home until your symptoms have been gone for at least one day.  Covering your mouth and nose with your elbow when you cough or sneeze.  Avoiding close contact with others, especially babies and  elderly people.  Why are these changes important? Getting a flu shot and practicing good health habits protects you as well as other people. If you get the flu, your friends, family, and co-workers are also at risk of getting it, because it spreads so easily to others. Each year, about 2 out of every 10 people get the flu. Having the flu can lead to complications, such as pneumonia, ear infection, and sinus infection. The flu also can be deadly, especially for babies, people older than age 49, and people who have serious long-term diseases. How is this treated? Most people recover  from the flu by resting at home and drinking plenty of fluids. However, a prescription antiviral medicine may reduce your flu symptoms and may make your flu go away sooner. This medicine must be started within a few days of getting flu symptoms. You can talk with your health care provider about whether you need an antiviral medicine. Antiviral medicine may be prescribed for people who are at risk for more serious flu symptoms. This includes people who:  Are older than age 49.  Are pregnant.  Have a condition that makes the flu worse or more dangerous.  Where to find more information:  Centers for Disease Control and Prevention: tsavxtf.comwww.cdc.gov/flu/index.htm  ItsBlog.frFlu.gov: InternetEnthusiasts.huwww.flu.gov/prevention-vaccination  American Academy of Family Physicians: familydoctor.org/familydoctor/en/kids/vaccines/preventing-the-flu.html Contact a health care provider if:  You have influenza and you develop new symptoms.  You have: ? Chest pain. ? Diarrhea. ? A fever.  Your cough gets worse, or you produce more mucus. Summary  The best way to prevent the flu is to get a flu shot every year in the fall.  Even if you get the flu after you have received the yearly vaccine, your flu may be milder and go away sooner because of your flu shot.  If you get the flu, antiviral medicines that are started with a few days of symptoms may reduce your flu symptoms and may make your flu go away sooner.  You can also help prevent the flu by practicing good health habits. This information is not intended to replace advice given to you by your health care provider. Make sure you discuss any questions you have with your health care provider. Document Released: 11/01/2015 Document Revised: 06/25/2016 Document Reviewed: 06/25/2016 Elsevier Interactive Patient Education  [employer]2018 Elsevier Inc.

## 2017-11-02 NOTE — Assessment & Plan Note (Addendum)
Assessment BP 103/73. Chronic and stable  Plan - Continue propranolol 80mg  TID for migraines

## 2017-11-02 NOTE — Assessment & Plan Note (Addendum)
Patient wanted her TSH checked today. She endorses hair loss, some palpitations, and feeling restless. Denies diarrhea. Last TSH was normal in 05/2017. Currently on Synthroid 100mcg daily.  Plan - Check TSH - Continue levothyroxine 100mcg daily  ADDENDUM, 11/03/2017 TSH elevated at 11.980. Called patient and informed her about the elevated result and to increase to levothyroxine 112mcg daily (new prescription sent to pharmacy). Also asked patient to return to clinic in 6 weeks for a labs only visit. - Increase to levothyroxine 112mcg daily - RTC in 6 weeks for TSH check

## 2017-11-02 NOTE — Progress Notes (Signed)
   CC: management of hypothyroidism  HPI:  [name] is a 47 y.o. with PMH of bipolar 1 disorder, HTN, and hypothyroidism who presents for management of hypothyroidism and medication refills.  Please see the assessment and plan below for the status of the patient's chronic medical problems.  Past Medical History:  Diagnosis Date  . Bipolar 1 disorder (HCC)    Followed by Mental Health.  All psychiatric medications prescribed by Mental Health.  Stable for many years.    . Deep venous thrombosis of leg (HCC) 2012   completed year of coumadin   . Dyspareunia 03/04/11  . Hx of measles   . Hypothyroidism    Hypothyroidism since 8th grade.  Managed by Dr. Sharl MaKerr, Endocrinology.  On synthroid.  No prior thyroid surgery/ablation.   . Migraines   . Monilia infection 12/31/10  . Vaginal atrophy 03/04/11   Review of Systems:   CV: Denies chest pain. Endorses some palpitations. PULM: Denies shortness of breath. ABD: Denies N/V/D. NEURO: Endorses episodes of dizziness that mostly occur when she stands up too quickly.  Physical Exam:  Vitals:   11/02/17 1426  BP: 103/73  Pulse: 64  Temp: 98.1 F (36.7 C)  TempSrc: Oral  SpO2: 99%  Weight: 232 lb 6.4 oz (105.4 kg)  Height: 5\' 5"  (1.651 m)   GEN: Alert and oriented.  CV: NR & RR, no m/r/g PULM: CTAB. No wheezes, rhonchi, or rales EXT: No edema  Assessment & Plan:   See Encounters Tab for problem based charting.  Patient discussed with Dr. Josem KaufmannKlima

## 2017-11-03 LAB — TSH: TSH: 11.98 u[IU]/mL — AB (ref 0.450–4.500)

## 2017-11-03 MED ORDER — LEVOTHYROXINE SODIUM 112 MCG PO TABS: 112.0000 ug | ORAL_TABLET | Freq: Every day | ORAL | 0 refills | Status: DC

## 2017-11-03 NOTE — Addendum Note (Signed)
Addended by: Pearletha AlfredHUANG, Ramona Ruark J on: 11/03/2017 09:37 AM   Modules accepted: Orders

## 2017-11-04 NOTE — Progress Notes (Signed)
   Subjective: Patient presents today for follow up evaluation of right plantar fasciitis.  She states the pain in her right heel has worsened.  She reports some relief after receiving the injection for approximately 2 weeks.  She has been wearing the fascial brace with some relief as well.  Taking tramadol and meloxicam do not provide any significant relief.  Patient presents today for further treatment and evaluation.   Past Medical History:  Diagnosis Date  . Bipolar 1 disorder (HCC)    Followed by Mental Health.  All psychiatric medications prescribed by Mental Health.  Stable for many years.    . Deep venous thrombosis of leg (HCC) 2012   completed year of coumadin   . Dyspareunia 03/04/11  . Hx of measles   . Hypothyroidism    Hypothyroidism since 8th grade.  Managed by Dr. Sharl MaKerr, Endocrinology.  On synthroid.  No prior thyroid surgery/ablation.   . Migraines   . Monilia infection 12/31/10  . Vaginal atrophy 03/04/11     Objective: Physical Exam General: The patient is alert and oriented x3 in no acute distress.  Dermatology: Skin is warm, dry and supple bilateral lower extremities. Negative for open lesions or macerations bilateral.   Vascular: Dorsalis Pedis and Posterior Tibial pulses palpable bilateral.  Capillary fill time is immediate to all digits.  Neurological: Epicritic and protective threshold intact bilateral.   Musculoskeletal: Tenderness to palpation at the medial calcaneal tubercale and through the insertion of the plantar fascia of the right foot. All other joints range of motion within normal limits bilateral. Strength 5/5 in all groups bilateral.    Assessment: 1. Plantar fasciitis right 2. Pain in right foot  Plan of Care:  1. Patient evaluated.  2. Injection of 0.5cc Celestone soluspan injected into the right plantar fascia  3. Continue wearing plantar fascial brace and taking meloxicam. 4.  Stressed importance of stretching. 5.  Recommended good shoe  gear. 6.  Return to clinic in 6 weeks.  Felecia ShellingBrent M. Cieanna Stormes, DPM [facility]  Dr. Felecia ShellingBrent M. Shresta Risden, DPM    [address]                                        [place], [place] [address]                Office [phone]) [phone]  Fax [phone]

## 2017-11-05 ENCOUNTER — Other Ambulatory Visit: Payer: Self-pay | Admitting: Podiatry

## 2017-11-06 ENCOUNTER — Telehealth: Payer: Self-pay | Admitting: *Deleted

## 2017-11-06 MED ORDER — TRAMADOL HCL 50 MG PO TABS: 50.0000 mg | ORAL_TABLET | Freq: Four times a day (QID) | ORAL | 0 refills | Status: DC | PRN

## 2017-11-06 NOTE — Telephone Encounter (Signed)
Pt requested refill of the tramadol. I told Dr. Logan BoresEvans pt has an appt 12/11/2017, and he okayed the refill tramadol. [id number].

## 2017-11-07 ENCOUNTER — Ambulatory Visit: Payer: Self-pay

## 2017-11-07 ENCOUNTER — Telehealth: Payer: Self-pay | Admitting: Internal Medicine

## 2017-11-07 NOTE — Telephone Encounter (Signed)
Pt states she would like to go back on Cytomel. Inform she will have to come in b/c"It is not routine therapy and normally managed by an endocrinologist (she previously saw Dr. Sharl MaKerr)," per Dr Criselda PeachesMullen. States she has an outstanding balance at Dr Daune PerchKerr's ( >600.00) , she does not drive and was just here on 1/3, cannot come in .  Next appt w/Dr Renaldo ReelHuang is 02/01/18.

## 2017-11-07 NOTE — Telephone Encounter (Signed)
Since last visit, she had an increase in her synthroid dose and was also on Cytomel in 2016.  Does she want to go back to her previous dose or add back Cytomel?    She would need to be seen for adding back Cytomel.  It is not routine therapy and normally managed by an endocrinologist (she previously saw Dr. Sharl MaKerr), so I would not add that back over the phone.  Does she have an appointment coming up to discuss with Dr. Renaldo ReelHuang?

## 2017-11-07 NOTE — Telephone Encounter (Signed)
I am empathetic to her situation, but this is not appropriate to treat over the phone as I have not evaluated the patient. Further, Cytomel is not routine care outside of endocrinology.  We would be happy to further discuss in the clinic.  If she has an acute change to her situation or symptoms, she should come in and be evaluated.  Thanks!

## 2017-11-07 NOTE — Telephone Encounter (Signed)
Patient is requesting to go back to the old Thyroid medicine that she was previous on, not sure what the name of it is.  Patient also says she needs to have labs done regarding thyroid test.

## 2017-11-08 NOTE — Telephone Encounter (Signed)
Called pt - explained in order to be re-started on Cytomel, she needs to schedule an appt per Dr Criselda PeachesMullen. Pt stated "thank-you for calling"; call ended.

## 2017-11-13 NOTE — Addendum Note (Signed)
Addended by: Doneen PoissonKLIMA, Askia Hazelip D on: 11/13/2017 08:04 AM   Modules accepted: Level of Service

## 2017-11-13 NOTE — Progress Notes (Signed)
Patient ID: Wendy AlbaMelanie B Chase, female   DOB: Sep 06, 1971, 47 y.o.   MRN: [id number]  Case discussed with Dr. Renaldo ReelHuang at the time of the visit.  We reviewed the resident's history and exam and pertinent patient test results.  I agree with the assessment, diagnosis, and plan of care documented in the resident's note.

## 2017-12-04 ENCOUNTER — Other Ambulatory Visit: Payer: Self-pay | Admitting: Podiatry

## 2017-12-05 ENCOUNTER — Other Ambulatory Visit: Payer: Self-pay | Admitting: Podiatry

## 2017-12-06 ENCOUNTER — Other Ambulatory Visit: Payer: Self-pay | Admitting: Internal Medicine

## 2017-12-06 ENCOUNTER — Telehealth: Payer: Self-pay | Admitting: Podiatry

## 2017-12-06 MED ORDER — TRAMADOL HCL 50 MG PO TABS: 50.0000 mg | ORAL_TABLET | Freq: Four times a day (QID) | ORAL | 0 refills | Status: DC | PRN

## 2017-12-06 NOTE — Telephone Encounter (Signed)
Informed pt Dr. Logan BoresEvans had refilled the tramadol. [facility] [phone].

## 2017-12-06 NOTE — Telephone Encounter (Signed)
Patient is requesting a refill on meclizine, [facility]. Pls call

## 2017-12-06 NOTE — Addendum Note (Signed)
Addended by: Alphia Kava'CONNELL, VALERY D on: 12/06/2017 09:49 AM   Modules accepted: Orders

## 2017-12-06 NOTE — Telephone Encounter (Signed)
I informed pt that I had not refused the tramadol because she did not have an appt, it was because she was continuing to have pain which needed to be evaluated. I told pt I would ask Dr. Logan BoresEvans for the refill and call again. Dr. Logan BoresEvans states refill as previously.

## 2017-12-06 NOTE — Telephone Encounter (Signed)
I'm a pt of Dr. Logan BoresEvans and I was denied my request for tramadol because I don't have an appointment. Well I do have an appointment on Monday afternoon. So could you please call in my medication? Thank you.

## 2017-12-07 MED ORDER — MECLIZINE HCL 25 MG PO TABS: ORAL_TABLET | ORAL | 1 refills | Status: DC

## 2017-12-11 ENCOUNTER — Encounter: Payer: Self-pay | Admitting: Podiatry

## 2017-12-11 ENCOUNTER — Ambulatory Visit: Payer: Medicaid Other | Admitting: Podiatry

## 2017-12-11 DIAGNOSIS — M722 Plantar fascial fibromatosis: Secondary | ICD-10-CM

## 2017-12-11 NOTE — Patient Instructions (Signed)
Pre-Operative Instructions  Congratulations, you have decided to take an important step towards improving your quality of life.  You can be assured that the doctors and staff at [facility] will be with you every step of the way.  Here are some important things you should know:  1. Plan to be at the surgery center/hospital at least 1 (one) hour prior to your scheduled time, unless otherwise directed by the surgical center/hospital staff.  You must have a responsible adult accompany you, remain during the surgery and drive you home.  Make sure you have directions to the surgical center/hospital to ensure you arrive on time. 2. If you are having surgery at [facility] or [facility], you will need a copy of your medical history and physical form from your family physician within one month prior to the date of surgery. We will give you a form for your primary physician to complete.  3. We make every effort to accommodate the date you request for surgery.  However, there are times where surgery dates or times have to be moved.  We will contact you as soon as possible if a change in schedule is required.   4. No aspirin/ibuprofen for one week before surgery.  If you are on aspirin, any non-steroidal anti-inflammatory medications (Mobic, Aleve, Ibuprofen) should not be taken seven (7) days prior to your surgery.  You make take Tylenol for pain prior to surgery.  5. Medications - If you are taking daily heart and blood pressure medications, seizure, reflux, allergy, asthma, anxiety, pain or diabetes medications, make sure you notify the surgery center/hospital before the day of surgery so they can tell you which medications you should take or avoid the day of surgery. 6. No food or drink after midnight the night before surgery unless directed otherwise by surgical center/hospital staff. 7. No alcoholic beverages 24-hours prior to surgery.  No smoking 24-hours prior or 24-hours after  surgery. 8. Wear loose pants or shorts. They should be loose enough to fit over bandages, boots, and casts. 9. Don't wear slip-on shoes. Sneakers are preferred. 10. Bring your boot with you to the surgery center/hospital.  Also bring crutches or a walker if your physician has prescribed it for you.  If you do not have this equipment, it will be provided for you after surgery. 11. If you have not been contacted by the surgery center/hospital by the day before your surgery, call to confirm the date and time of your surgery. 12. Leave-time from work may vary depending on the type of surgery you have.  Appropriate arrangements should be made prior to surgery with your employer. 13. Prescriptions will be provided immediately following surgery by your doctor.  Fill these as soon as possible after surgery and take the medication as directed. Pain medications will not be refilled on weekends and must be approved by the doctor. 14. Remove nail polish on the operative foot and avoid getting pedicures prior to surgery. 15. Wash the night before surgery.  The night before surgery wash the foot and leg well with water and the antibacterial soap provided. Be sure to pay special attention to beneath the toenails and in between the toes.  Wash for at least three (3) minutes. Rinse thoroughly with water and dry well with a towel.  Perform this wash unless told not to do so by your physician.  Enclosed: 1 Ice pack (please put in freezer the night before surgery)   1 Hibiclens skin cleaner     Pre-op instructions  If you have any questions regarding the instructions, please do not hesitate to call our office.  [place]: [address], [place], [place] [address] -- [phone]  [place]: [address] [place], [place] [address] -- [phone]  [place]: [address]  , [place] [address] -- [phone]  [place]: [address], [address], [place], [place] [address] -- [phone]  Website:  [email or web address] 

## 2017-12-12 ENCOUNTER — Telehealth: Payer: Self-pay | Admitting: *Deleted

## 2017-12-12 NOTE — Progress Notes (Signed)
   Subjective: Patient presents today for follow up evaluation of right plantar fasciitis. She reports continued intermittent pain. She states she has difficulty bearing weight at times. The injection from the previous visit provided some relief as well as taking the Mobic and tramadol. Patient presents today for further treatment and evaluation.   Past Medical History:  Diagnosis Date  . Bipolar 1 disorder (HCC)    Followed by Mental Health.  All psychiatric medications prescribed by Mental Health.  Stable for many years.    . Deep venous thrombosis of leg (HCC) 2012   completed year of coumadin   . Dyspareunia 03/04/11  . Hx of measles   . Hypothyroidism    Hypothyroidism since 8th grade.  Managed by Dr. Sharl MaKerr, Endocrinology.  On synthroid.  No prior thyroid surgery/ablation.   . Migraines   . Monilia infection 12/31/10  . Vaginal atrophy 03/04/11     Objective: Physical Exam General: The patient is alert and oriented x3 in no acute distress.  Dermatology: Skin is warm, dry and supple bilateral lower extremities. Negative for open lesions or macerations bilateral.   Vascular: Dorsalis Pedis and Posterior Tibial pulses palpable bilateral.  Capillary fill time is immediate to all digits.  Neurological: Epicritic and protective threshold intact bilateral.   Musculoskeletal: Tenderness to palpation at the medial calcaneal tubercale and through the insertion of the plantar fascia of the right foot. All other joints range of motion within normal limits bilateral. Strength 5/5 in all groups bilateral.    Assessment: 1. Plantar fasciitis right 2. Pain in right foot  Plan of Care:  1. Patient evaluated.  2. Today we discussed the conservative versus surgical management of the presenting pathology. The patient opts for surgical management. All possible complications and details of the procedure were explained. All patient questions were answered. No guarantees were expressed or implied. 3.  Authorization for surgery was initiated today. Surgery will consist of EPF right.  4. Return to clinic 1 week post op.    Felecia ShellingBrent M. Dayquan Buys, DPM [facility]  Dr. Felecia ShellingBrent M. Tevon Berhane, DPM    [address]                                        [place], [place] [address]                Office [phone]  Fax [phone]

## 2017-12-12 NOTE — Telephone Encounter (Signed)
"  I'm calling about my surgery.  I'm supposed to be scheduled."  We have you scheduled for March 13.  "Okay, what time?"  Someone from the surgical center will call you a day or two prior to your surgery date with the arrival time.  You need to go online and register with the surgical center.  "I'm not good at stuff like that."  No worries, someone from the surgical center will call you to get the information that they need.  "Okay, thank you."

## 2017-12-14 ENCOUNTER — Telehealth: Payer: Self-pay | Admitting: *Deleted

## 2017-12-14 NOTE — Telephone Encounter (Signed)
"  I'm a patient of Dr. Logan BoresEvans.  I scheduled for surgery on March 7.  I was just calling to see if it had been approved by Medicaid.  If you could call me back.  Thank you."  I'm returning your call.  Medicaid does not require authorization for surgery.  "Oh no, I'm not going to be able to have the surgery!"  Yes, you will be able to have the surgery.  Medicaid does not require us to get authorizations for surgery.  "Oh, I zoned out, I misunderstood what you were saying.  Thank you so much for letting me know."

## 2017-12-15 ENCOUNTER — Telehealth: Payer: Self-pay | Admitting: Internal Medicine

## 2017-12-15 NOTE — Telephone Encounter (Signed)
Patient said she called on Wednesday, medicine hasn't been sent to the pharmacy

## 2017-12-18 ENCOUNTER — Telehealth: Payer: Self-pay | Admitting: Internal Medicine

## 2017-12-18 ENCOUNTER — Other Ambulatory Visit: Payer: Self-pay | Admitting: *Deleted

## 2017-12-18 MED ORDER — SIMVASTATIN 40 MG PO TABS: 40.0000 mg | ORAL_TABLET | Freq: Every day | ORAL | 1 refills | Status: DC

## 2017-12-18 NOTE — Telephone Encounter (Signed)
done

## 2017-12-18 NOTE — Telephone Encounter (Signed)
refilled 

## 2017-12-18 NOTE — Telephone Encounter (Signed)
Patient is calling back, checking the status of her medicine

## 2017-12-19 ENCOUNTER — Other Ambulatory Visit: Payer: Self-pay | Admitting: Podiatry

## 2017-12-20 ENCOUNTER — Telehealth: Payer: Self-pay | Admitting: *Deleted

## 2017-12-20 NOTE — Telephone Encounter (Signed)
Request for refill of Tramadol. Faxed to [facility] [phone].

## 2017-12-28 ENCOUNTER — Other Ambulatory Visit: Payer: Self-pay

## 2017-12-28 NOTE — Telephone Encounter (Signed)
Needs to speak with a nurse about a refill on med. Please call pt back.

## 2017-12-29 ENCOUNTER — Telehealth: Payer: Self-pay | Admitting: *Deleted

## 2017-12-29 NOTE — Telephone Encounter (Signed)
"  What type of brace will I be in?"    I attempted to return her call.  I left her a message that I called and to return my call.  I left her a message that she will be wearing an air fracture walker after surgery.  I asked her to call if she had further questions.

## 2018-01-01 MED ORDER — LEVOTHYROXINE SODIUM 112 MCG PO TABS: 112.0000 ug | ORAL_TABLET | Freq: Every day | ORAL | 1 refills | Status: DC

## 2018-01-01 NOTE — Telephone Encounter (Signed)
Pt called / informed of refill. 

## 2018-01-03 ENCOUNTER — Telehealth: Payer: Self-pay | Admitting: *Deleted

## 2018-01-03 NOTE — Telephone Encounter (Signed)
Female caller states she has questions about pt's surgery.

## 2018-01-03 NOTE — Telephone Encounter (Signed)
Can I speak to [name]?  "This is her niece.  I'm not with her now but you can reach her at [phone]."  I'll give her a call at this number.  I am returning your call.  How can I help you?  "I was starting to panic.  I was going to ask Dr. Logan BoresEvans to call me something in but I'm okay now."  Okay, great.

## 2018-01-04 ENCOUNTER — Telehealth: Payer: Self-pay | Admitting: Podiatry

## 2018-01-04 ENCOUNTER — Encounter: Payer: Self-pay | Admitting: Podiatry

## 2018-01-04 DIAGNOSIS — M722 Plantar fascial fibromatosis: Secondary | ICD-10-CM | POA: Diagnosis not present

## 2018-01-04 MED ORDER — MELOXICAM 15 MG PO TABS: 15.0000 mg | ORAL_TABLET | Freq: Every day | ORAL | 0 refills | Status: DC

## 2018-01-04 NOTE — Addendum Note (Signed)
Addended by: Alphia Kava'CONNELL, Merla Sawka D on: 01/04/2018 02:38 PM   Modules accepted: Orders

## 2018-01-04 NOTE — Telephone Encounter (Signed)
I had surgery with Dr. Logan BoresEvans this morning. I forgot to ask for a prescription for meloxicam as I'm out of that. [facility] [address]? My telephone number is [phone]. Thank you.

## 2018-01-04 NOTE — Telephone Encounter (Signed)
I informed pt I would refill the Meloxicam for 30 days in case Dr. Logan BoresEvans wanted to change the medication, and if she wanted to refill ask him at the POV appt.

## 2018-01-04 NOTE — Telephone Encounter (Signed)
Dr. Logan BoresEvans states refill Meloxicam as 08/28/2017.

## 2018-01-05 NOTE — Progress Notes (Signed)
DOS 01/04/18 Endoscopic plantar fasciotomy Rt. 

## 2018-01-10 ENCOUNTER — Ambulatory Visit (INDEPENDENT_AMBULATORY_CARE_PROVIDER_SITE_OTHER): Payer: Medicaid Other | Admitting: Podiatry

## 2018-01-10 ENCOUNTER — Telehealth: Payer: Self-pay | Admitting: *Deleted

## 2018-01-10 ENCOUNTER — Encounter: Payer: Self-pay | Admitting: Podiatry

## 2018-01-10 VITALS — BP 121/83 | HR 68 | Temp 98.2°F

## 2018-01-10 DIAGNOSIS — Z9889 Other specified postprocedural states: Secondary | ICD-10-CM

## 2018-01-10 DIAGNOSIS — M722 Plantar fascial fibromatosis: Secondary | ICD-10-CM

## 2018-01-10 NOTE — Telephone Encounter (Signed)
I informed pt I would send message to Dr. Logan BoresEvans, and once he had responded I would call again.

## 2018-01-10 NOTE — Telephone Encounter (Signed)
Pt states she has two more percocet, left and she feels she will need more.

## 2018-01-11 ENCOUNTER — Telehealth: Payer: Self-pay | Admitting: Podiatry

## 2018-01-11 ENCOUNTER — Encounter: Payer: Self-pay | Admitting: *Deleted

## 2018-01-11 MED ORDER — OXYCODONE-ACETAMINOPHEN 5-325 MG PO TABS: 1.0000 | ORAL_TABLET | Freq: Three times a day (TID) | ORAL | 0 refills | Status: DC | PRN

## 2018-01-11 NOTE — Telephone Encounter (Signed)
I had surgery with Dr. Logan BoresEvans last Thursday 07 March and I'm out of my pain medication. I spoke to the nurse yesterday and she said she was going to talk to Dr. Logan BoresEvans but she never called me back. I placed the pt on hold and told Joya SanValery she was on the phone. Per Joya SanValery, I told the pt that she has not heard back from Dr. Logan BoresEvans but when she did Joya SanValery would give her a call. Pt stated thank you.

## 2018-01-11 NOTE — Telephone Encounter (Signed)
I informed pt Dr. Logan BoresEvans had ordered the percocet and she could pick it up in the [place] office.

## 2018-01-11 NOTE — Progress Notes (Signed)
   Subjective:  Patient presents today status post EPF right. DOS: 01/04/18. She states she is doing well. She denies any pain at this time. She denies fever or chill. Patient is here for further evaluation and treatment.    Past Medical History:  Diagnosis Date  . Bipolar 1 disorder (HCC)    Followed by Mental Health.  All psychiatric medications prescribed by Mental Health.  Stable for many years.    . Deep venous thrombosis of leg (HCC) 2012   completed year of coumadin   . Dyspareunia 03/04/11  . Hx of measles   . Hypothyroidism    Hypothyroidism since 8th grade.  Managed by Dr. Sharl MaKerr, Endocrinology.  On synthroid.  No prior thyroid surgery/ablation.   . Migraines   . Monilia infection 12/31/10  . Vaginal atrophy 03/04/11      Objective/Physical Exam Neurovascular status intact.  Skin incisions appear to be well coapted with sutures and staples intact. No sign of infectious process noted. No dehiscence. No active bleeding noted. Moderate edema noted to the surgical extremity.  Assessment: 1. s/p EPF right. DOS: 01/04/18   Plan of Care:  1. Patient was evaluated.  2. Antibiotic ointment applied with a Band-Aid. Continue doing this at home daily.  3. Post op shoe dispensed. Discontinue using CAM boot.  4. Return to clinic in 2 weeks.    Felecia ShellingBrent M. Judit Awad, DPM [facility]  Dr. Felecia ShellingBrent M. Bereket Gernert, DPM    [address]                                        [place], [place] [address]                Office [phone]  Fax [phone]

## 2018-01-11 NOTE — Telephone Encounter (Addendum)
Dr. Logan BoresEvans states okay to refill percocet 5/325mg  #30 one tablet every 8 hours.

## 2018-01-11 NOTE — Addendum Note (Signed)
Addended by: Alphia Kava'CONNELL, Nickoli Bagheri D on: 01/11/2018 02:38 PM   Modules accepted: Orders

## 2018-01-11 NOTE — Telephone Encounter (Signed)
I'm calling in regards to getting some pain medication for my foot. No one got back in touch me with yesterday and I was just wondering what's going on. My phone number is [phone]. Thanks.

## 2018-01-12 ENCOUNTER — Telehealth: Payer: Self-pay | Admitting: *Deleted

## 2018-01-12 NOTE — Telephone Encounter (Signed)
Pt states the pain medication Dr. Logan BoresEvans prescribed needs prior authorization.

## 2018-01-12 NOTE — Telephone Encounter (Signed)
Faxed required form to [employer] for prior authorization for oxycodone of 01/11/2018.

## 2018-01-12 NOTE — Telephone Encounter (Signed)
I informed pt the PA would take 7-10 days for results. Pt asked what she was to do and I told her she could self-pay for the medication.

## 2018-01-15 ENCOUNTER — Telehealth: Payer: Self-pay | Admitting: Podiatry

## 2018-01-15 NOTE — Telephone Encounter (Signed)
His patient called and said she was having pain at the surgical site.  She had just seen Dr. Logan BoresEvans in the office who removed the bandage applied at the surgical center.  She now described having severe pain.  I   told her  that I was unable to call pain into the pharmacy for her. I did tell her to ice and elevate the surgical site.  I told Dr.  Logan BoresEvans of her call on Wednesday night and he was going to have the office call her,   Wendy GuntherGregory Gara [facility]

## 2018-01-22 ENCOUNTER — Ambulatory Visit (INDEPENDENT_AMBULATORY_CARE_PROVIDER_SITE_OTHER): Payer: Medicaid Other | Admitting: Podiatry

## 2018-01-22 ENCOUNTER — Encounter: Payer: Self-pay | Admitting: Podiatry

## 2018-01-22 DIAGNOSIS — Z9889 Other specified postprocedural states: Secondary | ICD-10-CM

## 2018-01-24 NOTE — Progress Notes (Signed)
   Subjective:  Patient presents today status post EPF right. DOS: 01/04/18. She states she is doing well overall. She reports some discomfort along the medial aspect of the foot along the incision site. There are no modifying factors of the pain noted. Patient is here for further evaluation and treatment.    Past Medical History:  Diagnosis Date  . Bipolar 1 disorder (HCC)    Followed by Mental Health.  All psychiatric medications prescribed by Mental Health.  Stable for many years.    . Deep venous thrombosis of leg (HCC) 2012   completed year of coumadin   . Dyspareunia 03/04/11  . Hx of measles   . Hypothyroidism    Hypothyroidism since 8th grade.  Managed by Dr. Sharl MaKerr, Endocrinology.  On synthroid.  No prior thyroid surgery/ablation.   . Migraines   . Monilia infection 12/31/10  . Vaginal atrophy 03/04/11      Objective/Physical Exam Neurovascular status intact.  Skin incisions appear to be well coapted with sutures and staples intact. No sign of infectious process noted. No dehiscence. No active bleeding noted. Moderate edema noted to the surgical extremity.  Assessment: 1. s/p EPF right. DOS: 01/04/18   Plan of Care:  1. Patient was evaluated.  2. Sutures removed. Recommended antibiotic ointment and Band-Aid daily.  3. Transition into good sneakers.  4. Return to clinic in 3 weeks.    Felecia ShellingBrent M. Kiamesha Samet, DPM [facility]  Dr. Felecia ShellingBrent M. Ruba Outen, DPM    [address]                                        [place], [place] [address]                Office [phone]  Fax [phone]

## 2018-01-31 ENCOUNTER — Telehealth: Payer: Self-pay | Admitting: Podiatry

## 2018-01-31 ENCOUNTER — Telehealth: Payer: Self-pay | Admitting: *Deleted

## 2018-01-31 MED ORDER — OXYCODONE-ACETAMINOPHEN 5-325 MG PO TABS: 1.0000 | ORAL_TABLET | Freq: Three times a day (TID) | ORAL | 0 refills | Status: DC | PRN

## 2018-01-31 NOTE — Telephone Encounter (Signed)
I was wondering if Dr. Logan BoresEvans would please call me in some percocet at the drug store. I'm hurting so bad and I know its unusual for me to call and ask for pain medication like this. I'm just having a real bad time with it right now. You can call me on my brother-in-law's phone at [phone]. Thank you.

## 2018-01-31 NOTE — Telephone Encounter (Signed)
I informed pt that the prescription was waiting at the front desk in [place] for pick up.

## 2018-01-31 NOTE — Telephone Encounter (Signed)
I was wondering if Dr. Logan BoresEvans could call in some percocet for my foot. I use [facility] on [address]. My number is [phone].

## 2018-01-31 NOTE — Telephone Encounter (Signed)
Pt request refill of the percocet.

## 2018-01-31 NOTE — Telephone Encounter (Signed)
Dr.Evans refilled the percocet 5/325 as previously.Left message informing pt a percocet rx would be [place] at [place].

## 2018-02-01 ENCOUNTER — Other Ambulatory Visit: Payer: Self-pay | Admitting: Internal Medicine

## 2018-02-01 ENCOUNTER — Encounter: Payer: Self-pay | Admitting: Internal Medicine

## 2018-02-01 MED ORDER — MECLIZINE HCL 25 MG PO TABS: ORAL_TABLET | ORAL | 0 refills | Status: DC

## 2018-02-01 NOTE — Telephone Encounter (Signed)
Patient is requesting a refill on meclizine

## 2018-02-12 ENCOUNTER — Ambulatory Visit (INDEPENDENT_AMBULATORY_CARE_PROVIDER_SITE_OTHER): Payer: Medicaid Other

## 2018-02-12 ENCOUNTER — Ambulatory Visit (INDEPENDENT_AMBULATORY_CARE_PROVIDER_SITE_OTHER): Payer: Medicaid Other | Admitting: Podiatry

## 2018-02-12 ENCOUNTER — Encounter: Payer: Self-pay | Admitting: Podiatry

## 2018-02-12 DIAGNOSIS — M722 Plantar fascial fibromatosis: Secondary | ICD-10-CM

## 2018-02-12 DIAGNOSIS — M778 Other enthesopathies, not elsewhere classified: Secondary | ICD-10-CM

## 2018-02-12 DIAGNOSIS — M779 Enthesopathy, unspecified: Secondary | ICD-10-CM

## 2018-02-12 DIAGNOSIS — Z9889 Other specified postprocedural states: Secondary | ICD-10-CM

## 2018-02-12 MED ORDER — METHYLPREDNISOLONE 4 MG PO TBPK: ORAL_TABLET | ORAL | 0 refills | Status: DC

## 2018-02-16 NOTE — Progress Notes (Signed)
   Subjective:  Patient presents today status post EPF right. DOS: 01/04/18. She states the heel is doing much better. She denies any complaints from her surgery at this time.  She does reports a new complaint of pain to the dorsum of the right foot that began 5 days ago. She reports associated radiating pain into the toes. She has not done anything to treat these symptoms. There are no modifying factors noted. She denies any known trauma or injury. Patient is here for further evaluation and treatment.    Past Medical History:  Diagnosis Date  . Bipolar 1 disorder (HCC)    Followed by Mental Health.  All psychiatric medications prescribed by Mental Health.  Stable for many years.    . Deep venous thrombosis of leg (HCC) 2012   completed year of coumadin   . Dyspareunia 03/04/11  . Hx of measles   . Hypothyroidism    Hypothyroidism since 8th grade.  Managed by Dr. Sharl MaKerr, Endocrinology.  On synthroid.  No prior thyroid surgery/ablation.   . Migraines   . Monilia infection 12/31/10  . Vaginal atrophy 03/04/11      Objective/Physical Exam Neurovascular status intact.  Skin incisions appear to be well coapted. No sign of infectious process noted. No dehiscence. No active bleeding noted. Moderate edema noted to the surgical extremity. Pain with palpation to the dorsum of the right midfoot.   Radiographic Exam:  Normal osseous mineralization. Joint spaces preserved. No fracture/dislocation/boney destruction.    Assessment: 1. s/p EPF right. DOS: 01/04/18 2. Right midfoot capsulitis   Plan of Care:  1. Patient was evaluated. X-Rays reviewed.  2. Compression anklet dispensed.  3. Patient declined injection today.  4. Prescription for Medrol Dose Pak provided to patient.  5. Continue taking Mobic after finishing Medrol Dose Pak.  6. Recommended good shoe gear.  7. Return to clinic as needed.    Felecia ShellingBrent M. Laith Antonelli, DPM [facility]  Dr. Felecia ShellingBrent M. Bralee Feldt, DPM    [address]                                         [place], [place] [address]                Office [phone]  Fax [phone]

## 2018-03-01 ENCOUNTER — Ambulatory Visit: Payer: Medicaid Other | Admitting: Internal Medicine

## 2018-03-01 ENCOUNTER — Encounter: Payer: Self-pay | Admitting: Internal Medicine

## 2018-03-01 ENCOUNTER — Other Ambulatory Visit: Payer: Self-pay

## 2018-03-01 VITALS — BP 118/70 | HR 64 | Temp 99.5°F | Ht 65.0 in | Wt 232.9 lb

## 2018-03-01 DIAGNOSIS — R197 Diarrhea, unspecified: Secondary | ICD-10-CM | POA: Diagnosis present

## 2018-03-01 DIAGNOSIS — Z79899 Other long term (current) drug therapy: Secondary | ICD-10-CM

## 2018-03-01 DIAGNOSIS — F319 Bipolar disorder, unspecified: Secondary | ICD-10-CM | POA: Diagnosis not present

## 2018-03-01 DIAGNOSIS — Z9049 Acquired absence of other specified parts of digestive tract: Secondary | ICD-10-CM

## 2018-03-01 DIAGNOSIS — Z0000 Encounter for general adult medical examination without abnormal findings: Secondary | ICD-10-CM

## 2018-03-01 DIAGNOSIS — K219 Gastro-esophageal reflux disease without esophagitis: Secondary | ICD-10-CM

## 2018-03-01 DIAGNOSIS — Z79890 Hormone replacement therapy (postmenopausal): Secondary | ICD-10-CM

## 2018-03-01 DIAGNOSIS — E039 Hypothyroidism, unspecified: Secondary | ICD-10-CM | POA: Diagnosis not present

## 2018-03-01 DIAGNOSIS — Z90710 Acquired absence of both cervix and uterus: Secondary | ICD-10-CM | POA: Diagnosis not present

## 2018-03-01 DIAGNOSIS — I10 Essential (primary) hypertension: Secondary | ICD-10-CM

## 2018-03-01 DIAGNOSIS — E7800 Pure hypercholesterolemia, unspecified: Secondary | ICD-10-CM

## 2018-03-01 DIAGNOSIS — G43909 Migraine, unspecified, not intractable, without status migrainosus: Secondary | ICD-10-CM | POA: Diagnosis not present

## 2018-03-01 MED ORDER — CHOLESTYRAMINE LIGHT 4 G PO PACK: 2.0000 g | PACK | Freq: Every day | ORAL | 0 refills | Status: DC

## 2018-03-01 NOTE — Patient Instructions (Addendum)
FOLLOW-UP INSTRUCTIONS When: 3 months For: health maintenance What to bring: medications   [name],  It was good to see you again.  We are checking some bloodwork today. I am not sure exactly what is causing your diarrhea, but I think it could be diarrhea that people get after having a cholecystectomy (post-cholecystectomy diarrhea). This is caused by there being too much bile secreted into your intestines, which overwhelms the intestines and leads to diarrhea. The treatment we'll try is cholestyramine (Prevalite). Please use 2g once a day with a meal to see if this helps with your diarrhea. If after 7 days, you still do not have improvement, please increase to 2g twice a day with meals. If your diarrhea gets worse, you start to lose weight, or see blood, please call our office so we can figure out if there is anything else we need to do.  Please be sure to call Dr. Lilian [facility] office to get your PAP smear.  Otherwise, we will see you back in about 3 months  If you have any questions or concerns, call our clinic at [phone] or after hours call [phone] and ask for the internal medicine resident on call.

## 2018-03-01 NOTE — Progress Notes (Signed)
   CC: diarrhea  HPI:  [name] is a 47 y.o. female with PMH of bipolar disorder, hypothyroidism, and migraines who presents for evaluation of diarrhea.  Diarrhea: She endorses loose watery diarrhea after eating for the last month. She has approximately 4 episodes a day. She denies bloody stools. She also endorses constant nonradiating abdominal pain that is worse after eating and sometimes relieved by defecation. She endorses nausea but denies emesis. She denies nocturnal diarrhea, denies weight loss, or rashes. She is tolerating PO intake. She denies changes in her diet or recent travel. She did have a plantar fasciotomy in 01/04/2018 and had some medication changes at that time, but no medication changes recently leading up to the onset of her diarrhea. She has tried loperamide without relief.  She had similar diarrhea about 2 years ago. Celiac Ab, ESR, CRP, at that time were normal. The diarrhea resolved on its own at that time. Colonoscopy in 2010 showed diverticulosis. She denies smoking or alcohol use. Denies history of pancreatitis. She did have a cholecystectomy and appendectomy. She states that the reason for her cholecystectomy was that "my gallbladder was about to burst". Denies knowledge of gallstones. Denies recent antibiotics.  Past Medical History:  Diagnosis Date  . Bipolar 1 disorder ([place])    Followed by Mental Health.  All psychiatric medications prescribed by Mental Health.  Stable for many years.    . Deep venous thrombosis of leg ([place]) 2012   completed year of coumadin   . Dyspareunia 03/04/11  . Hx of measles   . Hypothyroidism    Hypothyroidism since 8th grade.  Managed by Dr. Buddy Duty, Endocrinology.  On synthroid.  No prior thyroid surgery/ablation.   . Migraines   . Monilia infection 12/31/10  . Vaginal atrophy 03/04/11   Review of Systems:   GEN: Negative for fevers CV: Negative for chest pain  PULM: Negative for cough or SOB ABD: Positive for abdominal pain,  diarrhea, and nausea EXT: Negative for LE swelling  Physical Exam:  Vitals:   03/01/18 1515  BP: 118/70  Pulse: 64  Temp: 99.5 F (37.5 C)  TempSrc: Oral  SpO2: 98%  Weight: 232 lb 14.4 oz (105.6 kg)  Height: '5\' 5"'$  (1.651 m)   GEN: Sitting comfortably in chair in NAD HENT: MMM, no visible lesions CV: NR & RR, no m/r/g PULM: CTAB, no wheezes or rales ABD: Soft, TTP in B/L lower quadrants, nondistended, +BS. No rebound or guarding. EXT: No LE edema SKIN: No rashes, good skin turgor, capillary refill <2sec  Assessment & Plan:   See Encounters Tab for problem based charting.  Patient discussed with Dr. Rebeca Alert

## 2018-03-01 NOTE — Assessment & Plan Note (Addendum)
Cervical cancer screening S/p hysterectomy. Last PAP smear in 2015 with Dr. Pennie Rushing. Gets PAP smears with GYN Dr. Pennie Rushing and prefers to have them done there. States she has not seen them again yet but will follow up with them. - Encouraged f/u with Dr. Pennie Rushing for PAP smear

## 2018-03-01 NOTE — Assessment & Plan Note (Addendum)
Assessment Previously had diarrhea in 2017. Celiac testing was unremarkable. ESR/CRP normal. Referral to GI was never completed. Stool studies were recommended, however [name] declined at that time. Diarrhea had completely resolved until about a month ago, when [name] had onset of postprandial abdominal pain and loose watery diarrhea again. No red flags. Weight stable. [name] is afebrile here and HDS. Tolerating PO intake.  Differential includes post-cholecystectomy diarrhea vs  hyperthyroidism (if we are now overtreating with levothyroxine), or IBS although this is a diagnosis of exclusion. Less likely IBD given no bloody diarrhea and patient's age, less likely mesenteric ischemia given stable weight, less likely chronic pancreatitis given no risk factors. Less likely infection given the resolution and recurrence of her symptoms and no recent travel or antibiotics.  Plan - Check TSH, CBC, CMP - Cholestyramine 2g once daily with meal --- Can increase to 2g BID with meals if no improvement after 7 days - Advised patient of return precautions - If still no improvement, can consider further work up with stool studies, FOBT, or GI referral  ADDENDUM: TSH normal. WBC slightly elevated, but otherwise labs are stable.

## 2018-03-01 NOTE — Assessment & Plan Note (Signed)
Stable.  Continue simvastatin. 

## 2018-03-01 NOTE — Assessment & Plan Note (Signed)
Assessment BP well controlled at 118/70.  Plan - Continue propranolol  TID for migraines

## 2018-03-01 NOTE — Assessment & Plan Note (Signed)
Stable.  Continue PPI. 

## 2018-03-01 NOTE — Assessment & Plan Note (Addendum)
Assessment Endorses diarrhea. Last TSH was elevated at 11.980 (11/03/2017), so levothyroxine was increased to daily. She reports compliance with his new increased dose.  Plan - Check TSH - Continue levothyroxine daily  ADDENDUM:  TSH normal. Called pt to let her know

## 2018-03-02 ENCOUNTER — Telehealth: Payer: Self-pay | Admitting: Internal Medicine

## 2018-03-02 LAB — CMP14 + ANION GAP
ALT: 23 IU/L (ref 0–32)
AST: 18 IU/L (ref 0–40)
Albumin/Globulin Ratio: 1.5 (ref 1.2–2.2)
Albumin: 3.8 g/dL (ref 3.5–5.5)
Alkaline Phosphatase: 105 IU/L (ref 39–117)
Anion Gap: 12 mmol/L (ref 10.0–18.0)
BUN/Creatinine Ratio: 9 (ref 9–23)
BUN: 12 mg/dL (ref 6–24)
Bilirubin Total: <0.2 mg/dL (ref 0.0–1.2)
CO2: 25 mmol/L (ref 20–29)
Calcium: 8.8 mg/dL (ref 8.7–10.2)
Chloride: 107 mmol/L — ABNORMAL HIGH (ref 96–106)
Creatinine, Ser: 1.27 mg/dL — ABNORMAL HIGH (ref 0.57–1.00)
GFR calc Af Amer: 58 mL/min/{1.73_m2} — ABNORMAL LOW (ref >59)
GFR calc non Af Amer: 50 mL/min/{1.73_m2} — ABNORMAL LOW (ref >59)
Globulin, Total: 2.5 g/dL (ref 1.5–4.5)
Glucose: 80 mg/dL (ref 65–99)
Potassium: 4.5 mmol/L (ref 3.5–5.2)
Sodium: 144 mmol/L (ref 134–144)
Total Protein: 6.3 g/dL (ref 6.0–8.5)

## 2018-03-02 LAB — CBC WITH DIFFERENTIAL/PLATELET
Basophils Absolute: 0 10*3/uL (ref 0.0–0.2)
Basos: 0 %
EOS (ABSOLUTE): 0.1 10*3/uL (ref 0.0–0.4)
Eos: 1 %
Hematocrit: 38.5 % (ref 34.0–46.6)
Hemoglobin: 13 g/dL (ref 11.1–15.9)
Immature Grans (Abs): 0 10*3/uL (ref 0.0–0.1)
Immature Granulocytes: 0 %
Lymphocytes Absolute: 3.2 10*3/uL — ABNORMAL HIGH (ref 0.7–3.1)
Lymphs: 29 %
MCH: 28.2 pg (ref 26.6–33.0)
MCHC: 33.8 g/dL (ref 31.5–35.7)
MCV: 84 fL (ref 79–97)
Monocytes Absolute: 0.6 10*3/uL (ref 0.1–0.9)
Monocytes: 5 %
Neutrophils Absolute: 7.2 10*3/uL — ABNORMAL HIGH (ref 1.4–7.0)
Neutrophils: 65 %
Platelets: 205 10*3/uL (ref 150–379)
RBC: 4.61 x10E6/uL (ref 3.77–5.28)
RDW: 14.3 % (ref 12.3–15.4)
WBC: 11.1 10*3/uL — ABNORMAL HIGH (ref 3.4–10.8)

## 2018-03-02 LAB — TSH: TSH: 2.31 u[IU]/mL (ref 0.450–4.500)

## 2018-03-02 NOTE — Telephone Encounter (Signed)
Patient calling to check labs results

## 2018-03-03 NOTE — Telephone Encounter (Signed)
Spoke to pt on phone to let her know of lab results. Thanks

## 2018-03-05 NOTE — Progress Notes (Signed)
Internal Medicine Clinic Attending  Case discussed with Dr. Renaldo Reel  at the time of the visit.  We reviewed the resident's history and exam and pertinent patient test results.  I agree with the excellent assessment, diagnosis, and plan of care documented in Dr. Phylliss Bob note.  Anne Shutter, MD

## 2018-03-12 ENCOUNTER — Encounter (HOSPITAL_COMMUNITY): Payer: Self-pay | Admitting: Emergency Medicine

## 2018-03-12 ENCOUNTER — Ambulatory Visit (HOSPITAL_COMMUNITY)
Admission: EM | Admit: 2018-03-12 | Discharge: 2018-03-12 | Disposition: A | Discharge status: Home / Self Care | Payer: Medicaid Other | Attending: Family Medicine | Admitting: Family Medicine

## 2018-03-12 DIAGNOSIS — J40 Bronchitis, not specified as acute or chronic: Secondary | ICD-10-CM

## 2018-03-12 MED ORDER — BENZONATATE 100 MG PO CAPS: 100.0000 mg | ORAL_CAPSULE | Freq: Two times a day (BID) | ORAL | 0 refills | Status: DC | PRN

## 2018-03-12 MED ORDER — CEPHALEXIN 500 MG PO CAPS: 500.0000 mg | ORAL_CAPSULE | Freq: Three times a day (TID) | ORAL | 0 refills | Status: DC

## 2018-03-12 NOTE — ED Triage Notes (Signed)
Pt c/o sore throat, chills, cough, "my lungs feel heavy".

## 2018-03-12 NOTE — ED Provider Notes (Signed)
[facility]   [id number] 03/12/18 Arrival Time: 1759   SUBJECTIVE:  [name] is a 47 y.o. female who presents to the urgent care with complaint of sore throat, chills, cough, "my lungs feel heavy".   Patient is a smoker but does not have a history of asthma.  She had a temperature up to 101 F.  She is bringing up green thick phlegm.  She has no shortness of breath.  Patient has no leg pain.  She has some anterior substernal tightness that is relieved by the cough.  She is doing a lot of coughing at night.  Patient spends her day caring for her mother who has had a stroke with right hemiparesis and verbal aphasia. Past Medical History:  Diagnosis Date  . Bipolar 1 disorder (HCC)    Followed by Mental Health.  All psychiatric medications prescribed by Mental Health.  Stable for many years.    . Deep venous thrombosis of leg (HCC) 2012   completed year of coumadin   . Dyspareunia 03/04/11  . Hx of measles   . Hypothyroidism    Hypothyroidism since 8th grade.  Managed by Dr. Sharl Ma, Endocrinology.  On synthroid.  No prior thyroid surgery/ablation.   . Migraines   . Monilia infection 12/31/10  . Vaginal atrophy 03/04/11   Family History  Problem Relation Age of Onset  . Other Father   . Cancer Maternal Aunt    Social History   Socioeconomic History  . Marital status: Legally Separated    Spouse name: Not on file  . Number of children: Not on file  . Years of education: Not on file  . Highest education level: Not on file  Occupational History  . Not on file  Social Needs  . Financial resource strain: Not on file  . Food insecurity:    Worry: Not on file    Inability: Not on file  . Transportation needs:    Medical: Not on file    Non-medical: Not on file  Tobacco Use  . Smoking status: Current Every Day Smoker    Packs/day: 1.00    Types: Cigarettes    Last attempt to quit: 10/22/2012    Years since quitting: 5.3  . Smokeless tobacco: Never Used  .  Tobacco comment:  trying  to cut back and then out  Substance and Sexual Activity  . Alcohol use: No    Alcohol/week: 0.0 oz  . Drug use: No  . Sexual activity: Not on file    Comment: hysterectomy  Lifestyle  . Physical activity:    Days per week: Not on file    Minutes per session: Not on file  . Stress: Not on file  Relationships  . Social connections:    Talks on phone: Not on file    Gets together: Not on file    Attends religious service: Not on file    Active member of club or organization: Not on file    Attends meetings of clubs or organizations: Not on file    Relationship status: Not on file  . Intimate partner violence:    Fear of current or ex partner: Not on file    Emotionally abused: Not on file    Physically abused: Not on file    Forced sexual activity: Not on file  Other Topics Concern  . Not on file  Social History Narrative  . Not on file   Current Facility-Administered Medications for the 03/12/18 encounter [facility]  Encounter)  Medication  . betamethasone acetate-betamethasone sodium phosphate (CELESTONE) injection 3 mg   No outpatient medications have been marked as taking for the 03/12/18 encounter [facility] Encounter).   Allergies  Allergen Reactions  . Codeine Nausea And Vomiting and Rash  . Morphine Shortness Of Breath  . Sulfa Antibiotics Shortness Of Breath  . Sulfonamide Derivatives Shortness Of Breath, Nausea Only and Rash  . Meloxicam Nausea Only  . Morphine And Related Other (See Comments)    Makes me loopy and hallucinates  . Naproxen Nausea Only and Rash      ROS: As per HPI, remainder of ROS negative.   OBJECTIVE:   Vitals:   03/12/18 1814  BP: 118/77  Pulse: 81  Resp: 18  Temp: 99.1 F (37.3 C)  SpO2: 99%     General appearance: alert; no distress Eyes: PERRL; EOMI; conjunctiva normal HENT: normocephalic; atraumatic; TMs normal, canal normal, external ears normal without trauma; nasal mucosa normal; oral mucosa  normal Neck: supple Lungs: Few bibasilar rales Heart: regular rate and rhythm Back: no CVA tenderness Extremities: no cyanosis or edema; symmetrical with no gross deformities Skin: warm and dry Neurologic: normal gait; grossly normal Psychological: alert and cooperative; normal mood and affect      Labs:  Results for orders placed or performed in visit on 03/01/18  TSH  Result Value Ref Range   TSH 2.310 0.450 - 4.500 uIU/mL  CBC with Diff  Result Value Ref Range   WBC 11.1 (H) 3.4 - 10.8 x10E3/uL   RBC 4.61 3.77 - 5.28 x10E6/uL   Hemoglobin 13.0 11.1 - 15.9 g/dL   Hematocrit 16.1 09.6 - 46.6 %   MCV 84 79 - 97 fL   MCH 28.2 26.6 - 33.0 pg   MCHC 33.8 31.5 - 35.7 g/dL   RDW 04.5 40.9 - 81.1 %   Platelets 205 150 - 379 x10E3/uL   Neutrophils 65 Not Estab. %   Lymphs 29 Not Estab. %   Monocytes 5 Not Estab. %   Eos 1 Not Estab. %   Basos 0 Not Estab. %   Neutrophils Absolute 7.2 (H) 1.4 - 7.0 x10E3/uL   Lymphocytes Absolute 3.2 (H) 0.7 - 3.1 x10E3/uL   Monocytes Absolute 0.6 0.1 - 0.9 x10E3/uL   EOS (ABSOLUTE) 0.1 0.0 - 0.4 x10E3/uL   Basophils Absolute 0.0 0.0 - 0.2 x10E3/uL   Immature Granulocytes 0 Not Estab. %   Immature Grans (Abs) 0.0 0.0 - 0.1 x10E3/uL  CMP14 + Anion Gap  Result Value Ref Range   Glucose 80 65 - 99 mg/dL   BUN 12 6 - 24 mg/dL   Creatinine, Ser 9.14 (H) 0.57 - 1.00 mg/dL   GFR calc non Af Amer 50 (L) >59 mL/min/1.73   GFR calc Af Amer 58 (L) >59 mL/min/1.73   BUN/Creatinine Ratio 9 9 - 23   Sodium 144 134 - 144 mmol/L   Potassium 4.5 3.5 - 5.2 mmol/L   Chloride 107 (H) 96 - 106 mmol/L   CO2 25 20 - 29 mmol/L   Anion Gap 12.0 10.0 - 18.0 mmol/L   Calcium 8.8 8.7 - 10.2 mg/dL   Total Protein 6.3 6.0 - 8.5 g/dL   Albumin 3.8 3.5 - 5.5 g/dL   Globulin, Total 2.5 1.5 - 4.5 g/dL   Albumin/Globulin Ratio 1.5 1.2 - 2.2   Bilirubin Total <0.2 0.0 - 1.2 mg/dL   Alkaline Phosphatase 105 39 - 117 IU/L   AST 18 0 - 40 IU/L  ALT 23 0 - 32 IU/L      Labs Reviewed - No data to display  No results found.     ASSESSMENT & PLAN:  1. Bronchitis   Patient's cough, while severe at times, does not appear to be life-threatening and has no signs of affecting activities of daily living.  Meds ordered this encounter  Medications  . cephALEXin (KEFLEX) 500 MG capsule    Sig: Take 1 capsule (500 mg total) by mouth 3 (three) times daily.    Dispense:  20 capsule    Refill:  0  . benzonatate (TESSALON) 100 MG capsule    Sig: Take 1-2 capsules (100-200 mg total) by mouth 2 (two) times daily as needed for cough.    Dispense:  20 capsule    Refill:  0    Reviewed expectations re: course of current medical issues. Questions answered. Outlined signs and symptoms indicating need for more acute intervention. Patient verbalized understanding. After Visit Summary given.    Procedures:      Elvina Sidle, MD 03/12/18 [phone]

## 2018-03-14 ENCOUNTER — Telehealth: Payer: Self-pay

## 2018-03-14 NOTE — Telephone Encounter (Signed)
This was prescribed by urg care md, dr Milus Glazier, not imc, dr Renaldo Reel do you want to address, inform her she needs imc appt or refer her back to urg care? Thank you

## 2018-03-14 NOTE — Telephone Encounter (Signed)
Pt states   cephALEXin (KEFLEX) 500 MG capsule   Is causing her to having diarrhea. Please call pt back.

## 2018-03-15 ENCOUNTER — Ambulatory Visit (INDEPENDENT_AMBULATORY_CARE_PROVIDER_SITE_OTHER): Payer: Medicaid Other | Admitting: Internal Medicine

## 2018-03-15 DIAGNOSIS — J302 Other seasonal allergic rhinitis: Secondary | ICD-10-CM | POA: Diagnosis not present

## 2018-03-15 DIAGNOSIS — J40 Bronchitis, not specified as acute or chronic: Secondary | ICD-10-CM | POA: Diagnosis not present

## 2018-03-15 DIAGNOSIS — F17200 Nicotine dependence, unspecified, uncomplicated: Secondary | ICD-10-CM

## 2018-03-15 DIAGNOSIS — R197 Diarrhea, unspecified: Secondary | ICD-10-CM | POA: Diagnosis not present

## 2018-03-15 DIAGNOSIS — Z8719 Personal history of other diseases of the digestive system: Secondary | ICD-10-CM

## 2018-03-15 DIAGNOSIS — R51 Headache: Secondary | ICD-10-CM | POA: Diagnosis not present

## 2018-03-15 HISTORY — DX: Bronchitis, not specified as acute or chronic: J40

## 2018-03-15 MED ORDER — CHLORPHENIRAMINE MALEATE 4 MG PO TABS: 4.0000 mg | ORAL_TABLET | Freq: Two times a day (BID) | ORAL | 0 refills | Status: DC | PRN

## 2018-03-15 MED ORDER — HYDROCODONE-HOMATROPINE 5-1.5 MG/5ML PO SYRP: 5.0000 mL | ORAL_SOLUTION | Freq: Four times a day (QID) | ORAL | 0 refills | Status: DC | PRN

## 2018-03-15 NOTE — Patient Instructions (Signed)
I prescribed a new stronger cough medicine that should hopefully be effective and you can take this up to every 6 hours as needed.  Further sinus pressure and congestion I recommend taking 4 mg chlorpheniramine up to twice daily as needed.  This medicine can cause significant drowsiness so I recommend starting at night.  I like to see how we do without additional antibiotics for now.  If you are getting significantly worse despite these medicines or start having new fevers please give me a call and I can prescribe an antibiotic for you.  Any appropriate antibiotic for this might worsen your diarrhea so I would like to see if the supportive treatment get you some benefit first.

## 2018-03-15 NOTE — Progress Notes (Signed)
   CC: Diarrhea, cough  HPI:  [name] is a 47 y.o. female with PMHx detailed below presenting about a cough that is bothering her for 2 weeks.  She was seen at an urgent care on 5/13 where she was told she has bronchitis and started on Keflex and Tessalon.  Since starting these medicines her cough is no better but she is developed increased diarrhea.  She describes these as loose normal colored stools 4 times or more daily.  She reports fevers up to 101 at home previously but this is now normal. There is mild cough associated chest but no abdominal pain. She does have a history of chronic intermittent diarrhea as well as seasonal allergies.  She does currently smoke.  See problem based assessment and plan below for additional details.  Bronchitis She has very bothersome symptoms but nothing concerning for a acute bacterial infection on my exam today.  It is very probable her diarrhea has been worsened with the starting Keflex. I discussed that most upper respiratory infections are viral even in severe cases and recommended conservative treatment for now to see if this would alleviate her diarrhea. Plan: She was agreeable to plan of supportive care with Hycodan cough syrup and chlorpheniramine and observe off antibiotics for whether symptoms improve or worsen in the next 1-2 week.   Past Medical History:  Diagnosis Date  . Bipolar 1 disorder (HCC)    Followed by Mental Health.  All psychiatric medications prescribed by Mental Health.  Stable for many years.    . Deep venous thrombosis of leg (HCC) 2012   completed year of coumadin   . Dyspareunia 03/04/11  . Hx of measles   . Hypothyroidism    Hypothyroidism since 8th grade.  Managed by Dr. Sharl Ma, Endocrinology.  On synthroid.  No prior thyroid surgery/ablation.   . Migraines   . Monilia infection 12/31/10  . Vaginal atrophy 03/04/11    Review of Systems: Review of Systems  Constitutional: Negative for chills and fever.  HENT:  Negative for hearing loss.   Eyes: Negative for blurred vision.  Respiratory: Positive for cough and sputum production. Negative for wheezing.   Cardiovascular: Negative for chest pain.  Gastrointestinal: Positive for diarrhea. Negative for abdominal pain and blood in stool.  Genitourinary: Negative for dysuria.  Musculoskeletal: Negative for myalgias.  Skin: Negative for rash.  Neurological: Positive for headaches. Negative for speech change.  Endo/Heme/Allergies: Positive for environmental allergies.     Physical Exam: There were no vitals filed for this visit. GENERAL- alert, co-operative, NAD HEENT-no conjunctival injection, no sinus tenderness to palpation, tympanic membranes are clear visualized bilaterally, no oropharyngeal erythema, no cervical lymphadenopathy CARDIAC- RRR, no murmurs, rubs or gallops. RESP- CTAB, no wheezes or crackles. ABDOMEN- Soft, nontender, no guarding or rebound, normoactive bowel sounds present EXTREMITIES- symmetric, no pedal edema. SKIN- Warm, dry, No rash or lesion. PSYCH- Normal mood and affect, appropriate thought content and speech.   Assessment & Plan:   See encounters tab for problem based medical decision making.   Patient discussed with Dr. Heide Spark

## 2018-03-15 NOTE — Telephone Encounter (Signed)
It looks like she was seen in [facility] today. Sorry about my delay in response. Thanks

## 2018-03-16 ENCOUNTER — Telehealth: Payer: Self-pay | Admitting: Internal Medicine

## 2018-03-16 NOTE — Telephone Encounter (Signed)
   Reason for call:   I received a call from [name] at 10:55 PM indicating that she has continued sore throat from her bronchitis.   Pertinent Data:   Patient reports frequent cough and sore throat since 5/13. She was seen in urgent care and clinic and diagnosed with bronchitis. She was prescribed Hycodan cough syrup on 5/16. She has not had significant relief with Hycodan today. She has also been using chloraseptic spray and cough drops without much relief.    Assessment / Plan / Recommendations:   Likely a viral bronchitis. Advised that she continue supportive care as symptoms may persist for 2-4 weeks. If her symptoms worsen or develops fevers, she will call clinic for potential need of antibiotics as per Dr. Dimple Chase who examined her on 5/16. She is understanding.  As always, pt is advised that if symptoms worsen or new symptoms arise, they should go to an urgent care facility or to to ER for further evaluation.   Wendy Mclean, MD   03/16/2018, 11:05 PM

## 2018-03-19 ENCOUNTER — Encounter: Payer: Self-pay | Admitting: Internal Medicine

## 2018-03-19 NOTE — Assessment & Plan Note (Signed)
She has very bothersome symptoms but nothing concerning for a acute bacterial infection on my exam today.  It is very probable her diarrhea has been worsened with the starting Keflex. I discussed that most upper respiratory infections are viral even in severe cases and recommended conservative treatment for now to see if this would alleviate her diarrhea. Plan: She was agreeable to plan of supportive care with Hycodan cough syrup and chlorpheniramine and observe off antibiotics for whether symptoms improve or worsen in the next 1-2 week.

## 2018-03-21 NOTE — Progress Notes (Signed)
Internal Medicine Clinic Attending  Case discussed with Dr. Rice at the time of the visit.  We reviewed the resident's history and exam and pertinent patient test results.  I agree with the assessment, diagnosis, and plan of care documented in the resident's note.  

## 2018-04-22 ENCOUNTER — Encounter: Payer: Self-pay | Admitting: *Deleted

## 2018-05-02 ENCOUNTER — Other Ambulatory Visit: Payer: Self-pay | Admitting: Internal Medicine

## 2018-05-02 MED ORDER — MECLIZINE HCL 25 MG PO TABS: ORAL_TABLET | ORAL | 0 refills | Status: DC

## 2018-05-02 NOTE — Telephone Encounter (Signed)
Patient is requesting refill on meclizine 25mg , pls send to [facility] [address]

## 2018-05-27 ENCOUNTER — Other Ambulatory Visit: Payer: Self-pay | Admitting: Internal Medicine

## 2018-06-12 ENCOUNTER — Other Ambulatory Visit: Payer: Self-pay | Admitting: Internal Medicine

## 2018-06-15 NOTE — Telephone Encounter (Signed)
Returning call about her prescriptions.  Pt contacted her pharmacy on 06/12/2018 and has not heard back.  Please call the patient.

## 2018-06-18 ENCOUNTER — Other Ambulatory Visit: Payer: Self-pay | Admitting: Internal Medicine

## 2018-06-18 ENCOUNTER — Telehealth: Payer: Self-pay | Admitting: Internal Medicine

## 2018-06-18 MED ORDER — SIMVASTATIN 40 MG PO TABS: 40.0000 mg | ORAL_TABLET | Freq: Every day | ORAL | 0 refills | Status: DC

## 2018-06-18 MED ORDER — MECLIZINE HCL 25 MG PO TABS: ORAL_TABLET | ORAL | 0 refills | Status: DC

## 2018-06-18 NOTE — Telephone Encounter (Signed)
Needs refills on simvastatin (ZOCOR) 40 MG tablet, meclizine (ANTIVERT) 25 MG tablet [facility]- pt said pharmacy has faxed over request since Wednesday, pt is completely out of her medicine, pt contact# [phone]

## 2018-06-18 NOTE — Telephone Encounter (Signed)
Received notice that [name] had called the system. After speaking with the patient she notified me that her medication request had not been addressed x 4 days and was feeling rather under appreciated. Although I am not certain as to why this was not addressed and informed the patient as to the same, I offered to look into it further. It appears as if her PCP is in town, but I do not see a communication directed to her or an attending directly. I do not know why this was not responded to or if indeed there was an inappropriate delay. I did however, feel it appropriate to provide a refill for the meclizine and simvastatin of course following a brief chart review. I trust this was appropriate.   Thank you  Wendy BalLawrence Zena Vitelli, MD

## 2018-06-19 MED ORDER — SIMVASTATIN 40 MG PO TABS: 40.0000 mg | ORAL_TABLET | Freq: Every day | ORAL | 3 refills | Status: DC

## 2018-06-19 NOTE — Telephone Encounter (Signed)
This was just filled earlier this month but only for 90 days. I sent in a yr supply.  Pls sch PCP appt Nov / Dec / Jan for 6 month F/U

## 2018-06-21 ENCOUNTER — Ambulatory Visit: Payer: Self-pay

## 2018-07-02 ENCOUNTER — Other Ambulatory Visit: Payer: Self-pay | Admitting: Internal Medicine

## 2018-07-31 ENCOUNTER — Other Ambulatory Visit: Payer: Self-pay | Admitting: Internal Medicine

## 2018-07-31 NOTE — Telephone Encounter (Signed)
Needs refill on meclizine (ANTIVERT) 25 MG tablet @ [facility] [address]  ;pt contact [phone]

## 2018-07-31 NOTE — Telephone Encounter (Signed)
Hi,  If planned not to get an appointment, please let me know to take care of refill. Thanks

## 2018-08-01 ENCOUNTER — Other Ambulatory Visit: Payer: Self-pay | Admitting: Internal Medicine

## 2018-08-01 NOTE — Telephone Encounter (Signed)
Next appt scheduled 11/21 with PCP. 

## 2018-08-01 NOTE — Telephone Encounter (Signed)
Appt has been sch for 09/20/2018 with her PCP.

## 2018-08-02 ENCOUNTER — Telehealth: Payer: Self-pay | Admitting: Internal Medicine

## 2018-08-02 MED ORDER — MECLIZINE HCL 25 MG PO TABS: ORAL_TABLET | ORAL | 0 refills | Status: DC

## 2018-08-02 NOTE — Telephone Encounter (Signed)
Patient called requesting refill on Meclizine. Refill sent.

## 2018-08-14 ENCOUNTER — Telehealth: Payer: Self-pay | Admitting: Internal Medicine

## 2018-08-14 ENCOUNTER — Emergency Department (HOSPITAL_COMMUNITY): Payer: Medicaid Other

## 2018-08-14 ENCOUNTER — Emergency Department (HOSPITAL_COMMUNITY)
Admission: EM | Admit: 2018-08-14 | Discharge: 2018-08-15 | Disposition: A | Discharge status: Home / Self Care | Payer: Medicaid Other | Attending: Emergency Medicine | Admitting: Emergency Medicine

## 2018-08-14 ENCOUNTER — Telehealth: Payer: Self-pay | Admitting: *Deleted

## 2018-08-14 ENCOUNTER — Other Ambulatory Visit: Payer: Self-pay

## 2018-08-14 ENCOUNTER — Encounter (HOSPITAL_COMMUNITY): Payer: Self-pay | Admitting: Emergency Medicine

## 2018-08-14 ENCOUNTER — Ambulatory Visit: Payer: Medicaid Other | Admitting: Internal Medicine

## 2018-08-14 DIAGNOSIS — M7989 Other specified soft tissue disorders: Secondary | ICD-10-CM

## 2018-08-14 DIAGNOSIS — I10 Essential (primary) hypertension: Secondary | ICD-10-CM | POA: Insufficient documentation

## 2018-08-14 DIAGNOSIS — Z86718 Personal history of other venous thrombosis and embolism: Secondary | ICD-10-CM

## 2018-08-14 DIAGNOSIS — Z87311 Personal history of (healed) other pathological fracture: Secondary | ICD-10-CM | POA: Diagnosis not present

## 2018-08-14 DIAGNOSIS — R0600 Dyspnea, unspecified: Secondary | ICD-10-CM | POA: Diagnosis present

## 2018-08-14 DIAGNOSIS — E039 Hypothyroidism, unspecified: Secondary | ICD-10-CM | POA: Insufficient documentation

## 2018-08-14 DIAGNOSIS — F17210 Nicotine dependence, cigarettes, uncomplicated: Secondary | ICD-10-CM | POA: Diagnosis not present

## 2018-08-14 DIAGNOSIS — M79661 Pain in right lower leg: Secondary | ICD-10-CM | POA: Insufficient documentation

## 2018-08-14 DIAGNOSIS — R079 Chest pain, unspecified: Secondary | ICD-10-CM | POA: Diagnosis not present

## 2018-08-14 DIAGNOSIS — Z6836 Body mass index (BMI) 36.0-36.9, adult: Secondary | ICD-10-CM | POA: Diagnosis not present

## 2018-08-14 DIAGNOSIS — Z79899 Other long term (current) drug therapy: Secondary | ICD-10-CM | POA: Diagnosis not present

## 2018-08-14 DIAGNOSIS — R2241 Localized swelling, mass and lump, right lower limb: Secondary | ICD-10-CM | POA: Diagnosis not present

## 2018-08-14 DIAGNOSIS — E669 Obesity, unspecified: Secondary | ICD-10-CM | POA: Diagnosis not present

## 2018-08-14 LAB — CBC
HCT: 40.1 % (ref 36.0–46.0)
HEMOGLOBIN: 13.1 g/dL (ref 12.0–15.0)
MCH: 27.8 pg (ref 26.0–34.0)
MCHC: 32.7 g/dL (ref 30.0–36.0)
MCV: 85.1 fL (ref 80.0–100.0)
PLATELETS: 231 10*3/uL (ref 150–400)
RBC: 4.71 MIL/uL (ref 3.87–5.11)
RDW: 13.9 % (ref 11.5–15.5)
WBC: 11.8 10*3/uL — ABNORMAL HIGH (ref 4.0–10.5)
nRBC: 0 % (ref 0.0–0.2)

## 2018-08-14 LAB — BASIC METABOLIC PANEL
Anion gap: 9 (ref 5–15)
BUN: 9 mg/dL (ref 6–20)
CALCIUM: 9.2 mg/dL (ref 8.9–10.3)
CO2: 26 mmol/L (ref 22–32)
Chloride: 109 mmol/L (ref 98–111)
Creatinine, Ser: 1.16 mg/dL — ABNORMAL HIGH (ref 0.44–1.00)
GFR calc Af Amer: >60 mL/min (ref >60)
GFR, EST NON AFRICAN AMERICAN: 54 mL/min — AB (ref >60)
GLUCOSE: 76 mg/dL (ref 70–99)
Potassium: 3.5 mmol/L (ref 3.5–5.1)
Sodium: 144 mmol/L (ref 135–145)

## 2018-08-14 LAB — I-STAT TROPONIN, ED: TROPONIN I, POC: 0 ng/mL (ref 0.00–0.08)

## 2018-08-14 NOTE — Telephone Encounter (Signed)
Call from pt - states she had a blood clot about 9-10 yrs ago. And now having same pain in her right leg. No c/o redness nor swelling. States she's unable to come in today to be seen.

## 2018-08-14 NOTE — ED Provider Notes (Signed)
[facility] EMERGENCY DEPARTMENT Provider Note   CSN: [id number] Arrival date & time: 08/14/18  2044     History   Chief Complaint Chief Complaint  Patient presents with  . Chest Pain  . Shortness of Breath    HPI [name] is a 47 y.o. female.  The history is provided by the patient.  She has history of bipolar disorder, hypothyroidism, migraines, hypertension, hyperlipidemia, DVT and comes in complaining of dyspnea and lower sternal chest pain for the last week.  Pain is dull and achy and worse when she takes a deep breath.  Pain is rated at 8/10.  She has taken ibuprofen and acetaminophen without relief.  There is associated dyspnea.  5 days ago, she started having pain and swelling in her right calf.  This is the calf that had DVT and did in the past.  That episode of DVT was related to her being in a cast.  She denies any recent travel or surgery denies taking exogenous estrogens.  She does smoke cigarettes.  She denies history of diabetes.  Past Medical History:  Diagnosis Date  . Bipolar 1 disorder (HCC)    Followed by Mental Health.  All psychiatric medications prescribed by Mental Health.  Stable for many years.    . Deep venous thrombosis of leg (HCC) 2012   completed year of coumadin   . Dyspareunia 03/04/11  . Hx of measles   . Hypothyroidism    Hypothyroidism since 8th grade.  Managed by Dr. Sharl Ma, Endocrinology.  On synthroid.  No prior thyroid surgery/ablation.   . Migraines   . Monilia infection 12/31/10  . Vaginal atrophy 03/04/11    Patient Active Problem List   Diagnosis Date Noted  . Right calf pain 08/14/2018  . Bronchitis 03/15/2018  . Obesity 06/24/2016  . Breast cyst 05/27/2016  . 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 Essential hypertension 06/01/2015  . Diarrhea 08/12/2014  . Migraines 08/12/2014  . Vertigo 05/21/2014  . Insomnia 03/11/2014  . Frequent falls 09/02/2013  . Preventative health care 08/16/2013  . Tobacco abuse  08/14/2012  . Menopausal symptoms 03/16/2012  . History of DVT of lower extremity 03/16/2012  . HYPERCHOLESTEROLEMIA, PURE 04/10/2007  . RHINITIS, ALLERGIC NOS 04/10/2007  . GERD 04/10/2007  . Hypothyroidism 01/17/2007  . Bipolar I disorder, most recent episode depressed (HCC) 01/17/2007    Past Surgical History:  Procedure Laterality Date  . ABDOMINAL HYSTERECTOMY  2000  . APPENDECTOMY    . CESAREAN SECTION  1998  . CHOLECYSTECTOMY    . left elbow    . 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 TONSILLECTOMY    . WISDOM TOOTH EXTRACTION       OB History    Gravida  1   Para  1   Term  1   Preterm      AB      Living  1     SAB      TAB      Ectopic      Multiple      Live Births               Home Medications    Prior to Admission medications   Medication Sig Start Date End Date Taking? Authorizing Provider  ARIPiprazole (ABILIFY) 30 MG tablet Take 30 mg by mouth at bedtime. 05/20/16   [provider]  buPROPion (WELLBUTRIN XL) 150 MG 24 hr tablet Take 150 mg  by mouth every morning.  05/21/16   [provider]  busPIRone (BUSPAR) 10 MG tablet Take 10 mg by mouth 4 (four) times daily. 05/10/16   [provider]  chlorpheniramine (CHLOR-TRIMETON) 4 MG tablet Take 1 tablet (4 mg total) by mouth 2 (two) times daily as needed for rhinitis. 03/15/18   Rice, Jamesetta Orleans, MD  cholestyramine light (PREVALITE) 4 g packet Take 0.5 packets (2 g total) by mouth daily. With a meal 03/01/18   Scherrie Gerlach, MD  gabapentin (NEURONTIN) 300 MG capsule Take 1 capsule (300 mg total) by mouth 3 (three) times daily. 11/02/17   Scherrie Gerlach, MD  HYDROcodone-homatropine [facility]) 5-1.5 MG/5ML syrup Take 5 mLs by mouth every 6 (six) hours as needed for cough. 03/15/18   Rice, Jamesetta Orleans, MD  hydrOXYzine (VISTARIL) 25 MG capsule TK 1 C PO TID PRA 02/07/17   [provider]  levothyroxine (SYNTHROID, LEVOTHROID) 112 MCG tablet TAKE 1 TABLET  BY MOUTH DAILY 07/03/18   Earl Lagos, MD  meclizine (ANTIVERT) 25 MG tablet TAKE 1 TABLET BY MOUTH TWICE DAILY AS NEEDED FOR DIZZINESS 08/02/18   Levora Dredge, MD  meloxicam (MOBIC) 15 MG tablet Take 1 tablet (15 mg total) by mouth daily. 01/04/18   Felecia Shelling, DPM  nystatin (MYCOSTATIN/NYSTOP) powder Apply topically 4 (four) times daily. 01/01/17   Burns, Tinnie Gens, MD  pantoprazole (PROTONIX) 40 MG tablet TAKE 1 TABLET(40 MG) BY MOUTH TWICE DAILY 30 MINUTES BEFORE BREAKFAST AND DINNER 05/28/18   Anne Shutter, MD  prochlorperazine (COMPAZINE) 10 MG tablet TAKE 1 TABLET(10 MG) BY MOUTH EVERY 6 HOURS AS NEEDED FOR MIGRAINE HEADACHE 12/20/16   Lora Paula, MD  propranolol (INDERAL) 80 MG tablet Take 1 tablet (80 mg total) 3 (three) times daily by mouth. 09/18/17   Doneen Poisson, MD  rizatriptan (MAXALT) 5 MG tablet TAKE 1 TABLET(5 MG) BY MOUTH 1 TIME AS NEEDED FOR MIGRAINE. MAY REPEAT IN 2 HOURS AS NEEDED 09/06/17   Gust Rung, DO  simvastatin (ZOCOR) 40 MG tablet Take 1 tablet (40 mg total) by mouth daily at 6 PM. 06/19/18   Burns Spain, MD  traZODone (DESYREL) 100 MG tablet Take 1 tablet (100 mg total) by mouth at bedtime. For sleep 12/31/14   Rankin, Rada Hay, NP    Family History Family History  Problem Relation Age of Onset  . Other Father   . Cancer Maternal Aunt     Social History Social History   Tobacco Use  . Smoking status: Current Every Day Smoker    Packs/day: 0.50    Types: Cigarettes    Last attempt to quit: 10/22/2012    Years since quitting: 5.8  . Smokeless tobacco: Never Used  . Tobacco comment:  trying  to cut back and then out  Substance Use Topics  . Alcohol use: No    Alcohol/week: 0.0 standard drinks  . Drug use: No     Allergies   Codeine; Morphine; Sulfa antibiotics; Sulfonamide derivatives; Meloxicam; Morphine and related; and Naproxen   Review of Systems Review of Systems  All other systems reviewed and are  negative.    Physical Exam Updated Vital Signs BP 126/86 (BP Location: Left Arm)   Pulse 72   Temp 98.5 F (36.9 C) (Oral)   Resp 18   SpO2 98%   Physical Exam  Nursing note and vitals reviewed.  47 year old female, resting comfortably and in no acute distress. Vital signs are normal.  Oxygen saturation is 98%, which is normal. Head is normocephalic and atraumatic. PERRLA, EOMI. Oropharynx is clear. Neck is nontender and supple without adenopathy or JVD. Back is nontender and there is no CVA tenderness. Lungs are clear without rales, wheezes, or rhonchi. Chest is nontender. Heart has regular rate and rhythm without murmur. Abdomen is soft, flat, nontender without masses or hepatosplenomegaly and peristalsis is normoactive. Extremities have no cyanosis or edema, full range of motion is present.  Right calf circumference is 1 cm greater than left calf circumference.  There is tenderness to palpation in the posterior aspect of the right calf without palpable cords.  Homans sign is negative.  Distal neurovascular exam is intact with strong pulses, prompt capillary refill, normal sensation. Skin is warm and dry without rash. Neurologic: Mental status is normal, cranial nerves are intact, there are no motor or sensory deficits.  ED Treatments / Results  Labs (all labs ordered are listed, but only abnormal results are displayed) Labs Reviewed  BASIC METABOLIC PANEL - Abnormal; Notable for the following components:      Result Value   Creatinine, Ser 1.16 (*)    GFR calc non Af Amer 54 (*)    All other components within normal limits  CBC - Abnormal; Notable for the following components:   WBC 11.8 (*)    All other components within normal limits  I-STAT TROPONIN, ED  I-STAT TROPONIN, ED    EKG EKG Interpretation  Date/Time:  Tuesday August 14 2018 20:49:43 EDT Ventricular Rate:  72 PR Interval:  164 QRS Duration: 96 QT Interval:  420 QTC Calculation: 459 R  Axis:   -7 Text Interpretation:  Normal sinus rhythm Low voltage QRS Cannot rule out Anterior infarct , age undetermined Abnormal ECG No old tracing to compare Confirmed by Dione Booze ([id number]) on 08/14/2018 11:07:33 PM   Radiology Dg Chest 2 View  Result Date: 08/14/2018 CLINICAL DATA:  Dyspnea and chest pain starting on Sunday. EXAM: CHEST - 2 VIEW COMPARISON:  06/14/2016 FINDINGS: The heart size and mediastinal contours are within normal limits. Both lungs are clear. The visualized skeletal structures are unremarkable. IMPRESSION: No active cardiopulmonary disease. Electronically Signed   By: Tollie Eth M.D.   On: 08/14/2018 21:54   Ct Angio Chest Pe W And/or Wo Contrast  Result Date: 08/15/2018 CLINICAL DATA:  Possible DVT in the right leg. Shortness of breath and chest pain starting on Sunday. EXAM: CT ANGIOGRAPHY CHEST WITH CONTRAST TECHNIQUE: Multidetector CT imaging of the chest was performed using the standard protocol during bolus administration of intravenous contrast. Multiplanar CT image reconstructions and MIPs were obtained to evaluate the vascular anatomy. CONTRAST:  75mL ISOVUE-370 IOPAMIDOL (ISOVUE-370) INJECTION 76% COMPARISON:  04/05/2011 FINDINGS: Cardiovascular: Good opacification of the central and segmental pulmonary arteries. No focal filling defects. No evidence of significant pulmonary embolus. Normal heart size. No pericardial effusions. Normal caliber thoracic aorta. Mediastinum/Nodes: Diffuse wall thickening of the esophagus possibly representing esophagitis or reflux disease. No dilatation. No significant lymphadenopathy in the chest. Lungs/Pleura: Motion artifact limits examination. Mild scarring in the lung bases similar previous study. Lungs appear otherwise clear and expanded. No pleural effusions. No pneumothorax. Upper Abdomen: Surgical absence of the gallbladder. Musculoskeletal: Degenerative changes in the spine. No destructive bone lesions. Review of the MIP  images confirms the above findings. IMPRESSION: No evidence of significant pulmonary embolus. No evidence of active pulmonary disease. Electronically Signed   By: Burman Nieves M.D.   On: 08/15/2018 01:04  Procedures Procedures  Medications Ordered in ED Medications  iopamidol (ISOVUE-370) 76 % injection (has no administration in time range)  iopamidol (ISOVUE-370) 76 % injection 100 mL (75 mLs Intravenous Contrast Given 08/15/18 0030)  oxyCODONE-acetaminophen (PERCOCET/ROXICET) 5-325 MG per tablet 1 tablet (1 tablet Oral Given 08/15/18 0137)  Rivaroxaban (XARELTO) tablet 15 mg (15 mg Oral Given 08/15/18 0225)     Initial Impression / Assessment and Plan / ED Course  I have reviewed the triage vital signs and the nursing notes.  Pertinent labs & imaging results that were available during my care of the patient were reviewed by me and considered in my medical decision making (see chart for details).  Pleuritic chest pain in the setting of right calf tenderness and swelling worrisome for DVT with pulmonary embolism.  Because of prior history of DVT and clinical presentation, risk of DVT and pulmonary embolism is high enough to warrant going directly to CT angiogram and not obtaining d-dimer.  Old records are reviewed, and she has had several negative venous Doppler tests following her episode of DVT, apparently has been off of anticoagulants since 2012.  Laboratory work-up is unremarkable.  Troponin is normal.  CT angiogram shows no evidence of pulmonary embolism or other pulmonary pathology.  Repeat troponin is also normal.  She is given dose of rivaroxaban.  Outpatient venous Doppler of her right leg has been scheduled for later this morning.  She is to follow-up with her primary care provider at the internal medicine clinic.  Final Clinical Impressions(s) / ED Diagnoses   Final diagnoses:  Nonspecific chest pain  Pain and swelling of right lower leg    ED Discharge Orders          Ordered    VAS [place] LOWER EXTREMITY VENOUS (DVT)     08/15/18 0340           Dione Booze, MD 08/15/18 [phone]

## 2018-08-14 NOTE — Telephone Encounter (Signed)
Talked to Dr Sandre Kitty - stated pt definitely needs to be seen if unable to come today , at least tomorrow if she's not having any other problem like SOB. Spoke to pt again - informed of Dr Sandre Kitty' response ; stated she will try to be here today at 2:15 PM if not she will call back.

## 2018-08-14 NOTE — ED Triage Notes (Signed)
Pt states that she was seen by her primary care doctor to for possible DVT in her R leg. Pt is also experiencing shortness of breath and chest pain that started on Sunday. States it hurts worse when she takes a deep breath.

## 2018-08-14 NOTE — Telephone Encounter (Signed)
   Reason for call:   I received a call from Ms. Liston Alba at 8 PM indicating left calf pain, chest pain and shortness of breath.   Pertinent Data:   Patient was seen in clinic today for calf pain which she was concerned was a recurrent DVT (last DVT was years ago and associated after surgery of leg and decreased ambulation as a result; she was treated with 6mos of warfarin). From chart review, exam not concerning for DVT, but in setting of her history a stat venous doppler study was ordered. From chart review an appointment for the study has not been made yet; patient states she was waiting by the phone as instructed and never received a call about an appointment.   She reports ongoing chest pain, central, character is dull pressure w/o radiation, diaphoresis, nausea, vomiting; she does reports palpitations, dizziness on standing (chronic but reports it's worse today), as well as new onset of shortness of breath 3-4 hours ago that was not present during her visit earlier today.    Assessment / Plan / Recommendations:   Patient with h/o DVT, risk factors of obesity and smoking with new calf pain, chest pain and now shortness of breath concerning for another VTE event. I advised that she go to the emergency room for evaluation from prompt evaluation to evaluate for PE. She stated she had no transportation currently; I advised that this would be an appropriate time to call EMS due to possible etiology of her symptoms and threat to life. She declined again and states that if her symptoms were to get worse she would consider calling EMS.  I will message our clinic staff to ensure stat doppler study is scheduled for tomorrow if patient were not to present to ED tonight.   Nyra Market, MD   08/14/2018, 8:08 PM

## 2018-08-14 NOTE — Progress Notes (Signed)
   CC: right calf pain  HPI:  [name] is a 47 y.o. female with past medical history outlined below here for right calf pain. For the details of today's visit, please refer to the assessment and plan.  Past Medical History:  Diagnosis Date  . Bipolar 1 disorder (HCC)    Followed by Mental Health.  All psychiatric medications prescribed by Mental Health.  Stable for many years.    . Deep venous thrombosis of leg (HCC) 2012   completed year of coumadin   . Dyspareunia 03/04/11  . Hx of measles   . Hypothyroidism    Hypothyroidism since 8th grade.  Managed by Dr. Sharl Ma, Endocrinology.  On synthroid.  No prior thyroid surgery/ablation.   . Migraines   . Monilia infection 12/31/10  . Vaginal atrophy 03/04/11    Review of Systems  Respiratory: Negative for shortness of breath.   Cardiovascular: Positive for chest pain.  Musculoskeletal:       Right calf pain    Physical Exam:  Vitals:   08/14/18 1414  BP: 110/79  Pulse: 72  Temp: 99.2 F (37.3 C)  TempSrc: Oral  SpO2: 100%  Weight: 229 lb 3.2 oz (104 kg)  Height: 5\' 6"  (1.676 m)    Constitutional: NAD, appears comfortable Cardiovascular: RRR, no murmurs, rubs, or gallops.  Pulmonary/Chest: CTAB, no wheezes, rales, or rhonchi.  Extremities: Warm and well perfused.  No edema.  Neurological: A&Ox3, CN II - XII grossly intact.  Skin: No rashes or erythema  Psychiatric: Normal mood and affect  Assessment & Plan:   See Encounters Tab for problem based charting.  Patient discussed with Dr. Sandre Kitty

## 2018-08-14 NOTE — Assessment & Plan Note (Signed)
Patient is presenting with acute onset right calf pain x5 days.  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  She completed 6 months of anticoagulation with warfarin at the time.  DVT was felt to be provoked secondary to ankle fracture and immobility while wearing a cast.  On exam today, there is no obvious swelling or erythema. She does have tenderness to palpation over her right calf and positive Homan's sign. She is also endorsing associated central chest pressure.  She denies shortness of breath.  Vitals are normal, she is oxygenating 100% on room air and pulse is 72.  She denies any recent travel, surgery, or hospitalization.  She is active and walks daily.  She denies a personal or family history of malignancy.  She does not take any estrogen products.  She does smoke cigarettes, currently half a pack per day and is obese.  Otherwise no risk factors for DVT.  Overall on exam there are no objective findings concerning for DVT aside from her reported pain.  However given her history, I feel it is reasonable to obtain Dopplers to rule out DVT.  Although she is having chest pain, I am reassured by her normal vitals.  Discussed the treatment for PE or DVT would be the same regardless with lifelong anticoagulation given her prior thromboembolic event.  We will hold off on CT chest for now.  Instructed patient to go to the ED if chest pain worsens or she develops shortness of breath. --STAT lower extremity Doppler

## 2018-08-14 NOTE — Telephone Encounter (Signed)
Thank you. Hope she goes to clinic or ED as soon as possible as it is concerning as Dr. Sandre Kitty said too.

## 2018-08-14 NOTE — Patient Instructions (Signed)
[name],  It was a pleasure to meet you. I am sorry to hear about your leg pain. I have ordered an ultrasound of your leg to look for a blood clot. You will be contacted to schedule this. Once I have the results, I will give you a call. If you develop symptoms of sudden onset chest pain or trouble breathing, please go to the emergency room. If you have any questions or concerns, call our clinic at [phone] or after hours call [phone] and ask for the internal medicine resident on call. Thank you!  Dr. Antony Contras

## 2018-08-15 ENCOUNTER — Emergency Department (HOSPITAL_COMMUNITY): Payer: Medicaid Other

## 2018-08-15 ENCOUNTER — Ambulatory Visit (HOSPITAL_COMMUNITY)
Admission: RE | Admit: 2018-08-15 | Discharge: 2018-08-15 | Disposition: A | Discharge status: Home / Self Care | Payer: Medicaid Other | Source: Ambulatory Visit | Attending: Emergency Medicine | Admitting: Emergency Medicine

## 2018-08-15 DIAGNOSIS — M79661 Pain in right lower leg: Secondary | ICD-10-CM | POA: Insufficient documentation

## 2018-08-15 DIAGNOSIS — R52 Pain, unspecified: Secondary | ICD-10-CM

## 2018-08-15 DIAGNOSIS — M7989 Other specified soft tissue disorders: Secondary | ICD-10-CM | POA: Diagnosis not present

## 2018-08-15 LAB — I-STAT TROPONIN, ED: TROPONIN I, POC: 0 ng/mL (ref 0.00–0.08)

## 2018-08-15 MED ORDER — IOPAMIDOL (ISOVUE-370) INJECTION 76%
100.0000 mL | Freq: Once | INTRAVENOUS | Status: AC | PRN
  Administered 2018-08-15: 75 mL via INTRAVENOUS

## 2018-08-15 MED ORDER — APIXABAN 5 MG PO TABS: 5.0000 mg | ORAL_TABLET | Freq: Two times a day (BID) | ORAL | Status: DC

## 2018-08-15 MED ORDER — IOPAMIDOL (ISOVUE-370) INJECTION 76%
INTRAVENOUS | Status: AC
  Filled 2018-08-15: qty 100

## 2018-08-15 MED ORDER — RIVAROXABAN 15 MG PO TABS
15.0000 mg | ORAL_TABLET | Freq: Once | ORAL | Status: AC
  Administered 2018-08-15: 15 mg via ORAL
  Filled 2018-08-15: qty 1

## 2018-08-15 MED ORDER — HYDROCODONE-ACETAMINOPHEN 5-325 MG PO TABS
1.0000 | ORAL_TABLET | Freq: Once | ORAL | Status: DC
  Filled 2018-08-15: qty 1

## 2018-08-15 MED ORDER — OXYCODONE-ACETAMINOPHEN 5-325 MG PO TABS
1.0000 | ORAL_TABLET | Freq: Once | ORAL | Status: AC
  Administered 2018-08-15: 1 via ORAL
  Filled 2018-08-15: qty 1

## 2018-08-15 NOTE — Discharge Instructions (Signed)
Return in the morning for venous Doppler test of your right leg.

## 2018-08-15 NOTE — ED Notes (Signed)
Patient doesn't drive and asked if she could stay until the am for vascular study , ok'd with EDP

## 2018-08-15 NOTE — Progress Notes (Signed)
Preliminary notes--Right lower extremity venous duplex exam completed. Negative for DVT.  Hongying Eshika Reckart (RDMS RVT) 08/15/18 8:54 AM

## 2018-08-15 NOTE — ED Notes (Signed)
Patient transported to admitting for vascular study per vascular Lab.

## 2018-08-22 NOTE — Progress Notes (Signed)
Internal Medicine Clinic Attending  Case discussed with Dr. Guilloud at the time of the visit.  We reviewed the resident's history and exam and pertinent patient test results.  I agree with the assessment, diagnosis, and plan of care documented in the resident's note.  Alexander Raines, M.D., Ph.D.  

## 2018-08-24 ENCOUNTER — Other Ambulatory Visit: Payer: Self-pay | Admitting: Internal Medicine

## 2018-08-30 NOTE — Telephone Encounter (Signed)
Pt called / informed of refilled.

## 2018-08-30 NOTE — Telephone Encounter (Signed)
Call from pt - states she has been out of medication for a week. I will ask the Attending to refill. Thanks

## 2018-08-31 ENCOUNTER — Telehealth: Payer: Self-pay

## 2018-08-31 NOTE — Telephone Encounter (Signed)
Requesting PA on pantoprazole (PROTONIX) 40 MG tablet.

## 2018-09-03 ENCOUNTER — Telehealth: Payer: Self-pay | Admitting: *Deleted

## 2018-09-03 NOTE — Telephone Encounter (Signed)
Call to [facility] to see if PA is needed.  Patient can continue with 30 day supplies or 60 tablets without the need for a PA.  Pharmacy to do a 30 day supply and call patient when ready for pick up.  Angelina Ok, RN 09/03/2018 3:29 PM.

## 2018-09-10 ENCOUNTER — Other Ambulatory Visit: Payer: Self-pay | Admitting: Internal Medicine

## 2018-09-10 DIAGNOSIS — I10 Essential (primary) hypertension: Secondary | ICD-10-CM

## 2018-09-10 DIAGNOSIS — G43009 Migraine without aura, not intractable, without status migrainosus: Secondary | ICD-10-CM

## 2018-09-11 ENCOUNTER — Telehealth: Payer: Self-pay | Admitting: Internal Medicine

## 2018-09-11 ENCOUNTER — Other Ambulatory Visit: Payer: Self-pay | Admitting: Internal Medicine

## 2018-09-11 DIAGNOSIS — I10 Essential (primary) hypertension: Secondary | ICD-10-CM

## 2018-09-11 DIAGNOSIS — G43009 Migraine without aura, not intractable, without status migrainosus: Secondary | ICD-10-CM

## 2018-09-11 MED ORDER — PROPRANOLOL HCL 80 MG PO TABS: 80.0000 mg | ORAL_TABLET | Freq: Three times a day (TID) | ORAL | 3 refills | Status: DC

## 2018-09-11 NOTE — Telephone Encounter (Signed)
   Reason for call:   I received a call from [name] at 9:25 PM indicating that she requested a refill for her propranolol and has not received any prescription yet.   Pertinent Data:   Patient uses propranolol 3 times a day for her migraine prophylaxis, per chart review there was a pended order from yesterday for refill, [facility]   Assessment / Plan / Recommendations:   Refill was provided.  As always, pt is advised that if symptoms worsen or new symptoms arise, they should go to an urgent care facility or to to ER for further evaluation.   Arnetha CourserAmin, Teyana Pierron, MD   09/11/2018, 9:28 PM

## 2018-09-13 ENCOUNTER — Encounter: Payer: Self-pay | Admitting: *Deleted

## 2018-09-13 ENCOUNTER — Other Ambulatory Visit: Payer: Self-pay | Admitting: Internal Medicine

## 2018-09-14 ENCOUNTER — Telehealth: Payer: Self-pay | Admitting: Internal Medicine

## 2018-09-14 NOTE — Telephone Encounter (Signed)
   Reason for call:   I received a call from [name] at 9:00 PM indicating that she needs a prescription for Magic mouthwash, unable to explain whether that lesions are painful or any description of those lesions.   Pertinent Data:   According to patient she develops similar lesions many years ago and was given Magic mouthwash which helps, I tried explaining to her that I need more description or she should come to the clinic/hospital to have them evaluated before prescribing anything.  Patient got really upset and hung the phone   Assessment / Plan / Recommendations:   I am not sure what type of mouth lesions she is having.  Just told her to do salt water rinsing and let any provider see them before deciding about Magic mouthwash.  Per patient she does not want to come to hospital just to show her mouth.  As always, pt is advised that if symptoms worsen or new symptoms arise, they should go to an urgent care facility or to to ER for further evaluation.   Arnetha CourserAmin, Ines Rebel, MD   09/14/2018, 9:52 PM

## 2018-09-14 NOTE — Telephone Encounter (Signed)
Next appt scheduled 11/08/18 with PCP. 

## 2018-09-17 ENCOUNTER — Other Ambulatory Visit: Payer: Self-pay

## 2018-09-17 ENCOUNTER — Telehealth: Payer: Self-pay | Admitting: Internal Medicine

## 2018-09-17 DIAGNOSIS — R42 Dizziness and giddiness: Secondary | ICD-10-CM

## 2018-09-17 MED ORDER — MECLIZINE HCL 25 MG PO TABS: 25.0000 mg | ORAL_TABLET | Freq: Two times a day (BID) | ORAL | 0 refills | Status: DC | PRN

## 2018-09-17 NOTE — Telephone Encounter (Signed)
Patient called after-hours pager to report that she has not gotten her meclizine. Intermittent vertigo. She was requesting a refill. 30 day refills. No refills attached this prescription.

## 2018-09-17 NOTE — Telephone Encounter (Signed)
Pt states the pharmacy is waiting for the clinic to reply back on meclizine (ANTIVERT) 25 MG tablet. Please call pt back.

## 2018-09-20 ENCOUNTER — Encounter: Payer: Self-pay | Admitting: Internal Medicine

## 2018-09-25 ENCOUNTER — Telehealth: Payer: Self-pay | Admitting: Internal Medicine

## 2018-09-25 DIAGNOSIS — E039 Hypothyroidism, unspecified: Secondary | ICD-10-CM

## 2018-09-25 MED ORDER — LEVOTHYROXINE SODIUM 112 MCG PO TABS: 112.0000 ug | ORAL_TABLET | Freq: Every day | ORAL | 1 refills | Status: DC

## 2018-09-25 NOTE — Telephone Encounter (Signed)
Patient called for her prescription of levothyroxine. Prescription sent.

## 2018-10-16 ENCOUNTER — Telehealth: Payer: Self-pay | Admitting: Internal Medicine

## 2018-10-16 ENCOUNTER — Other Ambulatory Visit: Payer: Self-pay | Admitting: Internal Medicine

## 2018-10-16 DIAGNOSIS — R42 Dizziness and giddiness: Secondary | ICD-10-CM

## 2018-10-16 MED ORDER — MECLIZINE HCL 25 MG PO TABS: 25.0000 mg | ORAL_TABLET | Freq: Two times a day (BID) | ORAL | 0 refills | Status: DC | PRN

## 2018-10-16 NOTE — Telephone Encounter (Signed)
   Reason for call:   I received a call from Ms. Liston AlbaMelanie B Strom at 8:02 PM requesting a refill for meclizine.   Pertinent Data:   Patient has documented history of primary vertigo and takes meclizine as needed for dizziness. She reported calling three times last week requesting a refill for this medication, but it has not been refilled. I see no documentation of this in her chart. Last documented encounter is a telephone note from 11/26 requesting refill of levothyroxine, which was refilled by Dr. Caron PresumeHelberg.    Assessment / Plan / Recommendations:   Refilled meclizine 25 mg BID PRN x 30 days. No refills provided. Has appointment with PCP on 1/9.   As always, pt is advised that if symptoms worsen or new symptoms arise, they should go to an urgent care facility or to to ER for further evaluation.   Burna CashSantos-Sanchez, Xaine Sansom, MD   10/16/2018, 8:26 PM

## 2018-11-08 ENCOUNTER — Encounter: Payer: Self-pay | Admitting: Internal Medicine

## 2018-11-13 ENCOUNTER — Encounter: Payer: Self-pay | Admitting: Internal Medicine

## 2018-11-19 ENCOUNTER — Telehealth: Payer: Self-pay | Admitting: Internal Medicine

## 2018-11-19 DIAGNOSIS — R42 Dizziness and giddiness: Secondary | ICD-10-CM

## 2018-11-19 MED ORDER — MECLIZINE HCL 25 MG PO TABS: 25.0000 mg | ORAL_TABLET | Freq: Two times a day (BID) | ORAL | 0 refills | Status: DC | PRN

## 2018-11-19 NOTE — Telephone Encounter (Signed)
Called by the patient. Requesting refill on meclizine. Refill sent.

## 2018-12-22 ENCOUNTER — Telehealth: Payer: Self-pay | Admitting: Internal Medicine

## 2018-12-22 DIAGNOSIS — R42 Dizziness and giddiness: Secondary | ICD-10-CM

## 2018-12-22 MED ORDER — MECLIZINE HCL 25 MG PO TABS: 25.0000 mg | ORAL_TABLET | Freq: Two times a day (BID) | ORAL | 0 refills | Status: DC | PRN

## 2018-12-22 NOTE — Telephone Encounter (Signed)
Patient called at 9pm today for meclizine refill. Refill provided. She had no further questions or concerns.  Nyra Market, MD IMTS - PGY3

## 2018-12-25 ENCOUNTER — Encounter: Payer: Self-pay | Admitting: Internal Medicine

## 2019-01-03 ENCOUNTER — Telehealth: Payer: Self-pay | Admitting: Internal Medicine

## 2019-01-03 NOTE — Telephone Encounter (Signed)
Received page from patient about upper abdominal pain. States that the pain is more under her breast, bilateral, and feels that it is gas. She has a difficult time describing the pain but states that it is worse with deep inspiration and worse with lying flat. The discomfort started on Monday, 12/31/2018. She has not tried anything for the discomfort. She has never had anything like this before. The pain does not appear to be worse with exertion. She endorses increased flatus and rhinorrhea but denies fevers, chills, N/V, abdominal pain/distention, rash, myalgias/arthralgias, cough, LE swelling.   On review of her chart is not on any medications that place her at increased risk for VTE/PE but does have a remote history of VTE in 2012 and is not on anticoagulation. She had a CTA in 07/2018 that did not illustrate any coronary calcium.   Based on her symptoms it sounds as though she is having pleuritic chest pain. Unfortunately without physically evaluating the patient it is hard to use the PERC/Well's score to risk stratify her. I advised her about being evaluated in person at an urgent care or ED but she would like to try gasX first and then try to be seen in the [facility] first thing in the morning. We discuss that if her symptoms worsen then she needs to be evaluated in the ED or an urgent care immediately. She voices understanding. She will page back if anything changes.

## 2019-01-04 ENCOUNTER — Telehealth: Payer: Self-pay | Admitting: Internal Medicine

## 2019-01-04 NOTE — Telephone Encounter (Signed)
Called patient to see if her symptoms were improving. She feels things are moving in the right direction and overall feels better. No new questions or concerns.

## 2019-01-09 ENCOUNTER — Telehealth: Payer: Self-pay | Admitting: Internal Medicine

## 2019-01-09 MED ORDER — GABAPENTIN 300 MG PO CAPS: 300.0000 mg | ORAL_CAPSULE | Freq: Three times a day (TID) | ORAL | 6 refills | Status: DC

## 2019-01-09 NOTE — Telephone Encounter (Signed)
Patient called needing a refill on her gabapentin. Prescription sent in.

## 2019-01-15 ENCOUNTER — Telehealth: Payer: Self-pay | Admitting: Internal Medicine

## 2019-01-15 NOTE — Telephone Encounter (Signed)
   Reason for call:   I received a call from Ms. Liston Alba at 9 PM indicating she has pulled a groin muscle and Tylenol is not helping her pain.   Pertinent Data:   Cannot remember exact inciting event  Improved with laying down, worse with movement  No bulge or protrusion to indicate hernia  Has tried tylenol, rest, and ice with minimal relief  States does not typically take NSAIDs due to hiatal hernia (on PPI) and they upset her stomach  States she had similar episode in the past and had flexeril prescribed by Orthopedic doc which helped  Does not want to be seen in clinic due to concern for COVID   Assessment / Plan / Recommendations:   Patient history most consistent with pulled groin muscle though cannot verify 100% over the phone  Advised patient to start with short course 5-7 days of Ibuprofen if she is able to tolerate this. She will call back if unable.  Continue heat and cool therapy to area  Do not want to prescribe flexeril at this time  As always, pt is advised that if symptoms worsen or new symptoms arise, they should go to an urgent care facility or to to ER for further evaluation.   Beola Cord, MD   01/15/2019, 9:05 PM

## 2019-01-19 ENCOUNTER — Telehealth: Payer: Self-pay | Admitting: Internal Medicine

## 2019-01-19 DIAGNOSIS — R42 Dizziness and giddiness: Secondary | ICD-10-CM

## 2019-01-19 MED ORDER — MECLIZINE HCL 25 MG PO TABS: 25.0000 mg | ORAL_TABLET | Freq: Two times a day (BID) | ORAL | 0 refills | Status: DC | PRN

## 2019-01-19 NOTE — Telephone Encounter (Signed)
Called for refill on Meclizine. Refill sent.

## 2019-01-31 ENCOUNTER — Telehealth: Payer: Self-pay

## 2019-01-31 NOTE — Telephone Encounter (Signed)
Requesting to speak with a nurse about getting nasal spray with steroid. Please call pt back.

## 2019-02-01 ENCOUNTER — Other Ambulatory Visit: Payer: Self-pay | Admitting: Internal Medicine

## 2019-02-01 ENCOUNTER — Telehealth: Payer: Self-pay | Admitting: Internal Medicine

## 2019-02-01 DIAGNOSIS — J302 Other seasonal allergic rhinitis: Secondary | ICD-10-CM

## 2019-02-01 MED ORDER — FLUTICASONE PROPIONATE 50 MCG/ACT NA SUSP: 1.0000 | Freq: Every day | NASAL | 1 refills | Status: DC

## 2019-02-01 NOTE — Progress Notes (Signed)
I returned [name]'s call. She is requesting for re-prescribing Flonase for her stuffy nose.  She has used Fluticasone spray before but it was discontinued.  She denies any fever, cough, body ache or any known sick contact.  -Sent prescription for Flonase.

## 2019-02-01 NOTE — Telephone Encounter (Signed)
I returned [name] call. She is requesting for re-prescribing Flonase for her stuffy nose.  She has used Fluticasone spray before but it was discontinued.  She denies any fever, cough, body ache or any known sick contact.  -Sent prescription for Flonase.  Teola Bradley, PGY-1

## 2019-02-03 ENCOUNTER — Telehealth: Payer: Self-pay | Admitting: Internal Medicine

## 2019-02-03 MED ORDER — ALBUTEROL SULFATE HFA 108 (90 BASE) MCG/ACT IN AERS: 2.0000 | INHALATION_SPRAY | Freq: Four times a day (QID) | RESPIRATORY_TRACT | 2 refills | Status: DC | PRN

## 2019-02-03 NOTE — Telephone Encounter (Signed)
   Reason for call:   I received a call from Ms. Liston Alba at 4:24 PM indicating that she started sneezing and having difficulty breathing after she was walking her dog in her neighborhood.   Pertinent Data:   Patient states that she was having difficulty breathing. It "took her 15 min to catch her breath".  The patient has a history of allergic rhinitis for which she takes chlorpeniramine. She has used an albuterol inhaler in the past for her allergies which she has found helpful.   Patient has been having to clear her throat often  She denies any anaphylactic type symptoms of tachycardia, chest tightness, facial swelling, or new rashes.   She denies any coughing, fevers, or tearing in her eyes.    Assessment / Plan / Recommendations:   Patient seems to be having an exacerbation of her allergic symptoms and it appears that she may have had some bronchospasms.   Prescribed albuterol for relief  As always, pt is advised that if symptoms worsen or new symptoms arise, they should go to an urgent care facility or to to ER for further evaluation.   Lorenso Courier, MD   02/03/2019, 4:22 PM

## 2019-02-18 ENCOUNTER — Telehealth: Payer: Self-pay | Admitting: Internal Medicine

## 2019-02-18 DIAGNOSIS — R42 Dizziness and giddiness: Secondary | ICD-10-CM

## 2019-02-18 MED ORDER — MECLIZINE HCL 25 MG PO TABS: 25.0000 mg | ORAL_TABLET | Freq: Two times a day (BID) | ORAL | 0 refills | Status: DC | PRN

## 2019-02-18 NOTE — Telephone Encounter (Signed)
   Reason for call:   I received a call from [name] at 7:15 PM indicating requesting refill of her meclizine.   Pertinent Data:   Patient is prescribed meclizine for chronic intermittent vertigo.    Assessment / Plan / Recommendations:   Refill of meclizine sent to [facility]  As always, pt is advised that if symptoms worsen or new symptoms arise, they should go to an urgent care facility or to to ER for further evaluation.   Wendy Poll, MD   02/18/2019, 7:55 PM

## 2019-03-01 ENCOUNTER — Telehealth: Payer: Self-pay | Admitting: Internal Medicine

## 2019-03-01 MED ORDER — RIZATRIPTAN BENZOATE 5 MG PO TABS: ORAL_TABLET | ORAL | 0 refills | Status: DC

## 2019-03-01 NOTE — Telephone Encounter (Signed)
   Reason for call:   I received a call from [name] at 20:59 PM indicating that she has been having a migraine headache since 15:15 this afternoon.   Pertinent Data:   States feels as if her head is in a jar and that she can hear the pulse in her ear. She has some accompanied blurry vision. Denies any weakness.   The headache is present in the front of her head and extending to her right side.   Headache has been constantly present and remained constant in quality as well.   She has used warm compresses on her forehead and 2 tablets of maxalt for the pain. Of not the maxalt was expired in 2019.  Worsened with bright light and loud noises   She sates she has not had a headache in 5-6 months.    Assessment / Plan / Recommendations:   Refilled patient's maxalt as she stated that it has helped her in the past. She stated that she will pick it up tomorrow morning 5/2. In the meantime I asked for her to sleep in a cool, dark, noise free room. I told her she can use the tylenol available to her at home in the meantime as well.   As always, pt is advised that if symptoms worsen or new symptoms arise, they should go to an urgent care facility or to to ER for further evaluation.   Lorenso Courier, MD   03/01/2019, 9:25 PM

## 2019-03-26 ENCOUNTER — Telehealth: Payer: Self-pay | Admitting: Internal Medicine

## 2019-03-26 DIAGNOSIS — R42 Dizziness and giddiness: Secondary | ICD-10-CM

## 2019-03-26 MED ORDER — MECLIZINE HCL 25 MG PO TABS: 25.0000 mg | ORAL_TABLET | Freq: Two times a day (BID) | ORAL | 0 refills | Status: DC | PRN

## 2019-03-26 NOTE — Telephone Encounter (Signed)
Patient called in for refill on her meclizine. Refill sent.

## 2019-04-03 ENCOUNTER — Telehealth: Payer: Self-pay | Admitting: Internal Medicine

## 2019-04-03 DIAGNOSIS — E039 Hypothyroidism, unspecified: Secondary | ICD-10-CM

## 2019-04-03 MED ORDER — LEVOTHYROXINE SODIUM 112 MCG PO TABS: 112.0000 ug | ORAL_TABLET | Freq: Every day | ORAL | 1 refills | Status: DC

## 2019-04-03 NOTE — Telephone Encounter (Signed)
Needs refill on Levothyroxine. Refill sent. Last TSH in 02/2018 appropriate. Needs rechecked at next appointment.

## 2019-04-18 ENCOUNTER — Other Ambulatory Visit: Payer: Self-pay

## 2019-04-18 ENCOUNTER — Telehealth: Payer: Self-pay

## 2019-04-18 ENCOUNTER — Encounter: Payer: Self-pay | Admitting: Internal Medicine

## 2019-04-18 ENCOUNTER — Ambulatory Visit (INDEPENDENT_AMBULATORY_CARE_PROVIDER_SITE_OTHER): Payer: Medicaid Other | Admitting: Internal Medicine

## 2019-04-18 DIAGNOSIS — F319 Bipolar disorder, unspecified: Secondary | ICD-10-CM | POA: Diagnosis not present

## 2019-04-18 DIAGNOSIS — M545 Low back pain, unspecified: Secondary | ICD-10-CM

## 2019-04-18 DIAGNOSIS — Z8781 Personal history of (healed) traumatic fracture: Secondary | ICD-10-CM | POA: Diagnosis not present

## 2019-04-18 DIAGNOSIS — F419 Anxiety disorder, unspecified: Secondary | ICD-10-CM | POA: Diagnosis not present

## 2019-04-18 DIAGNOSIS — Z79899 Other long term (current) drug therapy: Secondary | ICD-10-CM

## 2019-04-18 MED ORDER — CYCLOBENZAPRINE HCL 5 MG PO TABS: 5.0000 mg | ORAL_TABLET | Freq: Three times a day (TID) | ORAL | 0 refills | Status: DC | PRN

## 2019-04-18 NOTE — Telephone Encounter (Signed)
Requesting to speak with a nurse about back pain.

## 2019-04-18 NOTE — Telephone Encounter (Signed)
I would put her on for telehealth with Dr. Annie Paras this afternoon.  Muscle relaxer may be appropriate, but she is on quite a few sedatives.  Will need discussion.  Fine for telehealth.  Thanks

## 2019-04-18 NOTE — Assessment & Plan Note (Signed)
HPI: [name] is a 48 y.o. female with the medical conditions listed below who called the internal medicine clinic for back pain that started when she was lifting boxes yesterday morning. She states she was squatting down to lift a box, and she felt a "slip" or "pull" in her low back when she stood up. The pain is located in the middle of her low back and radiates down into both legs, staying above the knee. The pain is 7/10. Heating pads help the pain. Walking worsens the pain. She has been taking aleve q4hrs and tylenol without relief. She has a history of an L1 compression fracture in 2014 after a fall s/p kyphoplasty. She received a short course of flexeril in 2017 for a similar episode of acute lower back pain. She states she did not have any sedation or confusion while on the flexeril. No red flag signs like bowel/urinary incontinence or saddle anesthesia.  Assessment: Clinical presentation consistent with muscular strain of the back. Patient is on many sedating medications for bipolar disorder and anxiety including abilify, buproprion, buspar, hydroxyzine, and trazodone. She reports these medications have been stable for years and she dos not have any sedation or confusion when she takes them.  Plan - Flexeril 5mg  TID prn for 5 days - Continue NSAIDs  - Continue tylenol for breakthrough pain - Heating pads and early mobilization

## 2019-04-18 NOTE — Telephone Encounter (Signed)
Pt calls and states she hurt her back yesterday lifting boxes, she states she called the md on call last night and never rec'd a call back. She would like some flexeril called in. Offered an appt, refused stating she does not want to come in hosp. Please advise, call [phone]

## 2019-04-18 NOTE — Progress Notes (Signed)
   This is a telephone encounter between [name] Chase and [name] Chase  on 04/18/2019 for acute low back pain. The visit was conducted with the patient located at home and [name] [facility] Home. The patient's identity was confirmed using their DOB and current address. The patient has consented to being evaluated through a telephone encounter and understands the associated risks/benefits. I personally spent 7 minutes on medical discussion.   HPI:   [name] Chase is a 48 y.o. female with the medical conditions listed below who called the internal medicine clinic for back pain that started when she was lifting boxes yesterday morning. She states she was squatting down to lift a box, and she felt a "slip" or "pull" in her low back when she stood up. The pain is located in the middle of her low back and radiates down into both legs, staying above the knee. The pain is 7/10. Heating pads help the pain. Walking worsens the pain. She has been taking aleve q4hrs and tylenol without relief. She has a history of an L1 compression fracture in 2014 after a fall s/p kyphoplasty. She received a short course of flexeril in 2017 for a similar episode of acute lower back pain. She states she did not have any sedation or confusion while on the flexeril. No red flag signs like bowel/urinary incontinence or saddle anesthesia. Please see problem based charting for the assessment and plan.   Past Medical History:  Diagnosis Date  . Bipolar 1 disorder ([place])    Followed by Mental Health.  All psychiatric medications prescribed by Mental Health.  Stable for many years.    . 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 Dyspareunia 03/04/11  . Hx of measles   . Hypothyroidism    Hypothyroidism since 8th grade.  Managed by Dr. Buddy Chase, Endocrinology.  On synthroid.  No prior thyroid surgery/ablation.   . Migraines   . Monilia infection 12/31/10  . Vaginal atrophy 03/04/11    Review of  Systems:   Pertinent positives mentioned in HPI. Remainder of all ROS negative.   Assessment & Plan:   Patient discussed with Dr. Daryll Chase

## 2019-04-18 NOTE — Telephone Encounter (Signed)
Spoke w/ Margit Banda, informed pt dr dorrell would call between 1330 and 1530, pt stated she doesn't know if she will be at the phone, informed her dr dorrell will need to speak with her about medication, if she doesn't will not send script. She said oh, ok.wished her well

## 2019-04-23 NOTE — Progress Notes (Signed)
Internal Medicine Clinic Attending  Case discussed with Dr. Dorrell soon after the resident saw the patient.  We reviewed the resident's history, telephone conversation and pertinent patient test results.  I agree with the assessment, diagnosis, and plan of care documented in the resident's note.   

## 2019-04-25 ENCOUNTER — Other Ambulatory Visit: Payer: Self-pay | Admitting: Internal Medicine

## 2019-04-25 ENCOUNTER — Telehealth: Payer: Self-pay | Admitting: Internal Medicine

## 2019-04-25 DIAGNOSIS — R42 Dizziness and giddiness: Secondary | ICD-10-CM

## 2019-04-25 MED ORDER — MECLIZINE HCL 25 MG PO TABS: 25.0000 mg | ORAL_TABLET | Freq: Two times a day (BID) | ORAL | 1 refills | Status: DC | PRN

## 2019-04-26 NOTE — Telephone Encounter (Signed)
pt out of meclizine, refill apporpiate, refilled by dr Myrtie Hawk

## 2019-04-29 ENCOUNTER — Emergency Department (HOSPITAL_COMMUNITY)
Admission: EM | Admit: 2019-04-29 | Discharge: 2019-04-29 | Disposition: A | Discharge status: Home / Self Care | Payer: Medicaid Other | Attending: Emergency Medicine | Admitting: Emergency Medicine

## 2019-04-29 ENCOUNTER — Emergency Department (HOSPITAL_COMMUNITY): Payer: Medicaid Other

## 2019-04-29 ENCOUNTER — Other Ambulatory Visit: Payer: Self-pay

## 2019-04-29 ENCOUNTER — Telehealth: Payer: Self-pay | Admitting: Internal Medicine

## 2019-04-29 DIAGNOSIS — M5431 Sciatica, right side: Secondary | ICD-10-CM | POA: Diagnosis not present

## 2019-04-29 DIAGNOSIS — I10 Essential (primary) hypertension: Secondary | ICD-10-CM | POA: Insufficient documentation

## 2019-04-29 DIAGNOSIS — E039 Hypothyroidism, unspecified: Secondary | ICD-10-CM | POA: Insufficient documentation

## 2019-04-29 DIAGNOSIS — M25551 Pain in right hip: Secondary | ICD-10-CM | POA: Insufficient documentation

## 2019-04-29 DIAGNOSIS — F17210 Nicotine dependence, cigarettes, uncomplicated: Secondary | ICD-10-CM | POA: Diagnosis not present

## 2019-04-29 DIAGNOSIS — Z79899 Other long term (current) drug therapy: Secondary | ICD-10-CM | POA: Insufficient documentation

## 2019-04-29 MED ORDER — PREDNISONE 10 MG PO TABS: 40.0000 mg | ORAL_TABLET | Freq: Every day | ORAL | 0 refills | Status: AC

## 2019-04-29 MED ORDER — OXYCODONE-ACETAMINOPHEN 5-325 MG PO TABS
1.0000 | ORAL_TABLET | Freq: Once | ORAL | Status: AC
  Administered 2019-04-29: 1 via ORAL
  Filled 2019-04-29: qty 1

## 2019-04-29 NOTE — ED Notes (Signed)
Patient transported to X-ray 

## 2019-04-29 NOTE — ED Triage Notes (Signed)
Pt here from home with c/o right hip pain that began 2 weeks ago but has progressively worsened. Pt state pain radiates down her right leg. Denies any falls or injures.

## 2019-04-29 NOTE — ED Provider Notes (Signed)
[facility] EMERGENCY DEPARTMENT Provider Note   CSN: [id number] Arrival date & time: 04/29/19  0946    History   Chief Complaint Chief Complaint  Patient presents with  . Hip Pain    HPI [name] is a 48 y.o. female with PMHx bipolar disorder, DVT, hypothyroidism, who presents to the ED today complaining of gradual onset, constant, achy, 10/10, right hip pain radiating down RLE x 2 weeks. No known injury or overuse injury. Pt reports she woke up with the pain; she has been taking Tylenol and Ibuprofen without relief. Pt has been able to ambulate but reports worsening pain with ambulation. Pt does have hx of low back pain; denies back pain today. No fever, chills, saddle anesthesia, urinary retention, urinary incontinence, bowel incontinence, or any other associated symptoms. No hx chronic steroid use. No hx IVDA. No recent spinal manipulation.        Past Medical History:  Diagnosis Date  . Bipolar 1 disorder ([place])    Followed by Mental Health.  All psychiatric medications prescribed by Mental Health.  Stable for many years.    . Deep venous thrombosis of leg ([place]) 2012   completed year of coumadin   . Dyspareunia 03/04/11  . Hx of measles   . Hypothyroidism    Hypothyroidism since 8th grade.  Managed by Dr. Buddy Duty, Endocrinology.  On synthroid.  No prior thyroid surgery/ablation.   . Migraines   . Monilia infection 12/31/10  . Vaginal atrophy 03/04/11    Patient Active Problem List   Diagnosis Date Noted  . Right calf pain 08/14/2018  . Bronchitis 03/15/2018  . Obesity 06/24/2016  . Breast cyst 05/27/2016  . Acute midline low back pain 01/13/2016  . 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 Essential hypertension 06/01/2015  . Diarrhea 08/12/2014  . Migraines 08/12/2014  . Vertigo 05/21/2014  . Insomnia 03/11/2014  . Frequent falls 09/02/2013  . Preventative health care 08/16/2013  . Tobacco abuse 08/14/2012  . Menopausal symptoms 03/16/2012  .  History of DVT of lower extremity 03/16/2012  . HYPERCHOLESTEROLEMIA, PURE 04/10/2007  . RHINITIS, ALLERGIC NOS 04/10/2007  . GERD 04/10/2007  . Hypothyroidism 01/17/2007  . Bipolar I disorder, most recent episode depressed ([place]) 01/17/2007    Past Surgical History:  Procedure Laterality Date  . ABDOMINAL HYSTERECTOMY  2000  . APPENDECTOMY    . CESAREAN SECTION  1998  . CHOLECYSTECTOMY    . left elbow    . 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 TONSILLECTOMY    . WISDOM TOOTH EXTRACTION       OB History    Gravida  1   Para  1   Term  1   Preterm      AB      Living  1     SAB      TAB      Ectopic      Multiple      Live Births               Home Medications    Prior to Admission medications   Medication Sig Start Date End Date Taking? Authorizing Provider  albuterol (PROVENTIL HFA;VENTOLIN HFA) 108 (90 Base) MCG/ACT inhaler Inhale 2 puffs into the lungs every 6 (six) hours as needed for wheezing or shortness of breath. 02/03/19  Yes Chundi, Vahini, MD  ARIPiprazole (ABILIFY) 30 MG tablet Take 30 mg by mouth at bedtime. 05/20/16  Yes [provider]  buPROPion (WELLBUTRIN XL) 150 MG 24 hr tablet Take 150 mg by mouth daily.  05/21/16  Yes [provider]  busPIRone (BUSPAR) 15 MG tablet Take 15 mg by mouth 4 (four) times daily. 03/28/19  Yes [provider]  gabapentin (NEURONTIN) 300 MG capsule Take 1 capsule (300 mg total) by mouth 3 (three) times daily. 01/09/19  Yes Helberg, Jill AlexandersJustin, MD  hydrOXYzine (VISTARIL) 25 MG capsule Take 25 mg by mouth every 8 (eight) hours as needed for anxiety.  02/07/17  Yes [provider]  levothyroxine (SYNTHROID) 112 MCG tablet Take 1 tablet (112 mcg total) by mouth daily before breakfast. 04/03/19  Yes Helberg, Jill AlexandersJustin, MD  meclizine (ANTIVERT) 25 MG tablet Take 1 tablet (25 mg total) by mouth 2 (two) times daily as needed for dizziness. 04/25/19  Yes Masoudi, Elhamalsadat, MD  nystatin  (MYCOSTATIN/NYSTOP) powder Apply topically 4 (four) times daily. 01/01/17  Yes Burns, Alexa R, MD  pantoprazole (PROTONIX) 40 MG tablet TAKE 1 TABLET(40 MG) BY MOUTH TWICE DAILY 30 MINUTES BEFORE BREAKFAST AND DINNER Patient taking differently: Take 40 mg by mouth 2 (two) times daily before a meal.  08/30/18  Yes Inez CatalinaMullen, Emily B, MD  propranolol (INDERAL) 80 MG tablet Take 1 tablet (80 mg total) by mouth 3 (three) times daily. 09/11/18  Yes Arnetha CourserAmin, Sumayya, MD  rizatriptan (MAXALT) 5 MG tablet TAKE 1 TABLET(5 MG) BY MOUTH 1 TIME AS NEEDED FOR MIGRAINE. MAY REPEAT IN 2 HOURS AS NEEDED Patient taking differently: Take 5 mg by mouth as needed for migraine. TAKE 1 TABLET(5 MG) BY MOUTH 1 TIME AS NEEDED FOR MIGRAINE. MAY REPEAT IN 2 HOURS AS NEEDED 03/01/19  Yes Chundi, Vahini, MD  simvastatin (ZOCOR) 40 MG tablet Take 1 tablet (40 mg total) by mouth daily at 6 PM. 06/19/18  Yes Burns SpainButcher, Elizabeth A, MD  traZODone (DESYREL) 100 MG tablet Take 1 tablet (100 mg total) by mouth at bedtime. For sleep 12/31/14  Yes Rankin, Shuvon B, NP  cholestyramine light (PREVALITE) 4 g packet Take 0.5 packets (2 g total) by mouth daily. With a meal Patient not taking: Reported on 04/29/2019 03/01/18   Scherrie GerlachHuang, Jennifer, MD  cyclobenzaprine (FLEXERIL) 5 MG tablet Take 1 tablet (5 mg total) by mouth 3 (three) times daily as needed for muscle spasms. Patient not taking: Reported on 04/29/2019 04/18/19   Dorrell, Cathleen Cortieborah N, MD  fluticasone [facility](FLONASE) 50 MCG/ACT nasal spray Place 1 spray into both nostrils daily. Patient not taking: Reported on 04/29/2019 02/01/19 02/01/20  Masoudi, Shawna OrleansElhamalsadat, MD  predniSONE (DELTASONE) 10 MG tablet Take 4 tablets (40 mg total) by mouth daily for 5 days. 04/29/19 05/04/19  Tanda RockersVenter, Irving Lubbers, PA-C    Family History Family History  Problem Relation Age of Onset  . Other Father   . Cancer Maternal Aunt     Social History Social History   Tobacco Use  . Smoking status: Current Every Day Smoker    Packs/day:  0.50    Types: Cigarettes    Last attempt to quit: 10/22/2012    Years since quitting: 6.5  . Smokeless tobacco: Never Used  . Tobacco comment:  trying  to cut back and then out  Substance Use Topics  . Alcohol use: No    Alcohol/week: 0.0 standard drinks  . Drug use: No     Allergies   Codeine, Morphine, Sulfa antibiotics, Sulfonamide derivatives, Meloxicam, Morphine and related, and Naproxen   Review of Systems Review of Systems  Constitutional: Negative for diaphoresis,  fever and unexpected weight change.  HENT: Negative for congestion.   Eyes: Negative for visual disturbance.  Respiratory: Negative for cough and shortness of breath.   Cardiovascular: Negative for chest pain.  Gastrointestinal: Negative for abdominal pain, nausea and vomiting.  Genitourinary: Negative for difficulty urinating, dysuria and frequency.  Musculoskeletal: Positive for arthralgias. Negative for back pain.  Skin: Negative for rash.  Neurological: Negative for weakness and numbness.     Physical Exam Updated Vital Signs BP 119/85   Pulse 76   Temp 98.6 F (37 C) (Oral)   Resp 18   SpO2 97%   Physical Exam Vitals signs and nursing note reviewed.  Constitutional:      Appearance: She is not ill-appearing.  HENT:     Head: Normocephalic and atraumatic.  Eyes:     Conjunctiva/sclera: Conjunctivae normal.  Cardiovascular:     Rate and Rhythm: Normal rate and regular rhythm.  Pulmonary:     Effort: Pulmonary effort is normal.     Breath sounds: Normal breath sounds.  Musculoskeletal:     Comments: No leg length discrepancy of RLE compared to LLE; no rotation. Tenderness to palpation to right anterior hip; mild pain with hip flexion and internal/external rotation. No tenderness to C, T, or L midline spine or paraspinal muscles of lumbar region. No tenderness to right knee or right ankle. Strength and sensation intact throughout. + SLR on right. 2+ DP pulses bilaterally.   Skin:     General: Skin is warm and dry.     Coloration: Skin is not jaundiced.  Neurological:     Mental Status: She is alert.      ED Treatments / Results  Labs (all labs ordered are listed, but only abnormal results are displayed) Labs Reviewed - No data to display  EKG None  Radiology Dg Hip Unilat With Pelvis 2-3 Views Right  Result Date: 04/29/2019 CLINICAL DATA:  Right hip pain. EXAM: DG HIP (WITH OR WITHOUT PELVIS) 2-3V RIGHT COMPARISON:  No prior. FINDINGS: No acute bony or joint abnormality identified. No evidence of fracture or dislocation. IMPRESSION: No acute abnormality. Electronically Signed   By: Maisie Fushomas  Register   On: 04/29/2019 10:55    Procedures Procedures (including critical care time)  Medications Ordered in ED Medications  oxyCODONE-acetaminophen (PERCOCET/ROXICET) 5-325 MG per tablet 1 tablet (1 tablet Oral Given 04/29/19 1019)     Initial Impression / Assessment and Plan / ED Course  I have reviewed the triage vital signs and the nursing notes.  Pertinent labs & imaging results that were available during my care of the patient were reviewed by me and considered in my medical decision making (see chart for details).    48 year old female presenting to the ED today for atraumatic right hip pain radiating down RLE. Pt have been taking tylenol and ibuprofen without relief. Xray obtained in the ED today which was negative. Concern for sciatica given pain radiating down to her toes; strength and sensation intact throughout. No specific pain to her right lumbar paraspinal muscles or along the midline spine. No red flags for back pain. Will treat outpatient with steroids x 5 days; pt to follow up with her PCP. Advised to refrain from NSAID use while on steroids. Strict return precautions discussed. Pt is in agreement with plan at this time and stable for discharge home.       Final Clinical Impressions(s) / ED Diagnoses   Final diagnoses:  Acute pain of right hip  Sciatica of right side    ED Discharge Orders         Ordered    predniSONE (DELTASONE) 10 MG tablet  Daily     04/29/19 [id number]           Eleri Ruben, PA-C 04/29/19 1638    Arby BarrettePfeiffer, Marcy, MD 04/30/19 [phone]

## 2019-04-29 NOTE — ED Notes (Signed)
Pt has taken ibuprofen and tylenol for pain with no relief.

## 2019-04-29 NOTE — Discharge Instructions (Signed)
You were seen in the ED today for right hip pain radiating down your right leg to your toes; an xray of your right hip was negative for any fractures. This may be related to sciatica; please take the prednisone as prescribed for the next 5 days. You may take Tylenol as needed with it; do not use NSAIDs with the steroids. Please follow up with your PCP. Return to the ED for any worsening symptoms including worsening pain, fever, urinary retention, numbness in your groin, or if you begin to pee or poop on yourself.

## 2019-04-29 NOTE — Telephone Encounter (Signed)
Pt seen in ED today evaluated for right hip pain which was overall reassuring with normal x-rays good mobility, started on treatment for sciatica and hip pain.  She wass given percocet and steroid therapy for her pain and finds these unhelpful, she just started taking the steroid before the phone call.  She requests stronger opioid pain medicines over the phone.  Without an evaluation feel this would be inappropriate and patient understood she will seek further referral in the am for further management.

## 2019-04-30 ENCOUNTER — Ambulatory Visit: Payer: Medicaid Other

## 2019-05-01 ENCOUNTER — Ambulatory Visit: Payer: Medicaid Other

## 2019-05-02 ENCOUNTER — Other Ambulatory Visit: Payer: Self-pay

## 2019-05-02 ENCOUNTER — Encounter: Payer: Self-pay | Admitting: Internal Medicine

## 2019-05-02 ENCOUNTER — Ambulatory Visit: Payer: Medicaid Other | Admitting: Internal Medicine

## 2019-05-02 VITALS — BP 130/93 | HR 70 | Temp 98.8°F | Ht 66.0 in | Wt 228.3 lb

## 2019-05-02 DIAGNOSIS — Z79899 Other long term (current) drug therapy: Secondary | ICD-10-CM | POA: Diagnosis not present

## 2019-05-02 DIAGNOSIS — Z79891 Long term (current) use of opiate analgesic: Secondary | ICD-10-CM

## 2019-05-02 DIAGNOSIS — M25551 Pain in right hip: Secondary | ICD-10-CM

## 2019-05-02 DIAGNOSIS — M545 Low back pain, unspecified: Secondary | ICD-10-CM

## 2019-05-02 DIAGNOSIS — E039 Hypothyroidism, unspecified: Secondary | ICD-10-CM

## 2019-05-02 DIAGNOSIS — I10 Essential (primary) hypertension: Secondary | ICD-10-CM

## 2019-05-02 MED ORDER — OXYCODONE-ACETAMINOPHEN 5-325 MG PO TABS: 1.0000 | ORAL_TABLET | Freq: Two times a day (BID) | ORAL | 0 refills | Status: AC | PRN

## 2019-05-02 MED ORDER — CYCLOBENZAPRINE HCL 5 MG PO TABS: 5.0000 mg | ORAL_TABLET | Freq: Three times a day (TID) | ORAL | 0 refills | Status: DC | PRN

## 2019-05-02 NOTE — Patient Instructions (Addendum)
It was our pleasure taking care of you in our clinic today.  You came in with due to your right hip and low back pain.  Your physical exam is suggestive of sciatica pain or hip tendon inflammation . I prescribe you Percocet to be taken twice a day only if needed for un tolerable pain as well as Muscle relaxant. You can use heating pad for more releif.  Take Miralax if you developed constipation with Percocet. Please try to rest and take it easy on your self for next few days. If your pain did not get better a week, return to clinic for another evaluation and probably imaging.  I also send you to the lab for annual blood test for your thyroid disease. I will call you if the results will be abnormal.  Please take your medications as instructed for and contact us if you have any question or concern.    Come back to clinic in 2 or earlier if no improvement in your pain. If you develop any weakness, numbness or tingling on your back or legs, go to emergency department.  Thanks, Dr. Linna Hoff

## 2019-05-02 NOTE — Assessment & Plan Note (Deleted)
Blood pressure today is well controlled at .Marland KitchenMarland Kitchen

## 2019-05-02 NOTE — Assessment & Plan Note (Signed)
Patient reports worsening of right hip and low back pain.  Symptoms started 2 to 3 weeks ago.  She does not remember any sudden onset movement or heavy lifting that might have caused this. She did not have any improvement with taking NSAIDs.  She went to emergency room on Monday due to severe pain, lumbar/hip x-ray was performed and was unremarkable.  She was prescribed prednisone 10 mg for 5 days and 1 dose of Percocet.  She mentions that Percocet helped her significantly that day but she her pain came back again next day.  It is a constant 8-10/10 pain on lower back and lateral of right hip and groin.  The back pain radiates to back of her leg (patient confirmed that the pain radiates down to her toes.)  She can barely put weight on that leg.  Mentions that standing and walking makes the pain worse.  Sitting gives her relief.  Lying back does not completely relieve the pain. Denies any fever or chills, no decreased sensation and no weakness.  Physical exam with positive SLR and revrse SLR is suggestive of sciatica however she also has localized tenderness at lateral of her hip, that is more fit with greater trochanteric pain syndrome. We will do conservative management with pain control, rest, muscle relaxant and heating pad. She does have significant pain but no red flag. Will reevaluate in clinic if no improvement in 5 days and may consider further imaging if required.  -Percocet 5-375 mg every 12 hours for 5 days -Sent refill for Flexeril -Recommending to rest at home for few days -Recommending heating pad as needed -Follow-up in clinic if no improvement in next 5 days

## 2019-05-02 NOTE — Progress Notes (Signed)
   CC: Right hip and low back pain  HPI:  [name] is a 48 y.o. with past medical history documented as below, presented with worsening of right hip and low back pain. Please refer to problem based charting for further details and assessment and plan.  Past Medical History:  Diagnosis Date  . Bipolar 1 disorder ([place])    Followed by Mental Health.  All psychiatric medications prescribed by Mental Health.  Stable for many years.    . Deep venous thrombosis of leg ([place]) 2012   completed year of coumadin   . Dyspareunia 03/04/11  . Hx of measles   . Hypothyroidism    Hypothyroidism since 8th grade.  Managed by Dr. Buddy Duty, Endocrinology.  On synthroid.  No prior thyroid surgery/ablation.   . Migraines   . Monilia infection 12/31/10  . Vaginal atrophy 03/04/11   Review of Systems:  Review of Systems  Constitutional: Negative for weight loss.  Respiratory: Negative for shortness of breath.   Cardiovascular: Negative for chest pain, palpitations and leg swelling.  Gastrointestinal: Negative for abdominal pain, constipation, diarrhea, nausea and vomiting.  Genitourinary: Negative for dysuria, frequency and urgency.  Musculoskeletal: Positive for joint pain.  Neurological: Positive for focal weakness. Negative for dizziness, tremors, sensory change, weakness and headaches.     Physical Exam:  There were no vitals filed for this visit. Physical Exam  Constitutional: She is oriented to person, place, and time and well-developed, well-nourished, appears uncomfortable due to pain.  Eyes: EOM are  normal.  Cardiovascular: Normal rate and regular rhythm.  No murmur heard. Pulmonary/Chest: Effort normal and breath sounds normal. She has no  wheezes. She has no rales.  Abdominal: Soft. Bowel sounds are normal. She exhibits no distension.  Musculoskeletal:     tenderness palpation of lower lumbar/upper sacral spines. Tenderness to palpation of lateral aspect of right hip Neurological:  She is alert and oriented to person, place, and time. Positive right side SLR, positive reverse SLR, normal sensation, not able to check lower extremity motor strength due to pain Skin: No rash noted.  Psychiatric:  Mood, affect and judgment normal.  Vitals reviewed.  Assessment & Plan:   See Encounters Tab for problem based charting.  Patient discussed with Dr. Evette Doffing

## 2019-05-02 NOTE — Assessment & Plan Note (Addendum)
[name] denies any heat or cold intolerance, palpitation, weight change. Reports compliance to levothyroxine 112 MCG daily. Last TSH:2.3 03/01/2018  -Continue levothyroxine 112 MCG daily -Checking TSH today.  May adjust levothyroxine dose based on what the result be

## 2019-05-03 LAB — TSH: TSH: 2.44 u[IU]/mL (ref 0.450–4.500)

## 2019-05-06 NOTE — Progress Notes (Signed)
Internal Medicine Clinic Attending  Case discussed with Dr. Masoudi  at the time of the visit.  We reviewed the resident's history and exam and pertinent patient test results.  I agree with the assessment, diagnosis, and plan of care documented in the resident's note.  

## 2019-05-08 ENCOUNTER — Telehealth: Payer: Self-pay

## 2019-05-08 NOTE — Telephone Encounter (Signed)
Requesting refill on Oxycodone @  [id number] - [place], [place] [phone] (Phone) [phone] (Fax)

## 2019-05-09 NOTE — Telephone Encounter (Deleted)
I called Ms. Florence Canner to

## 2019-05-10 ENCOUNTER — Other Ambulatory Visit: Payer: Self-pay | Admitting: Internal Medicine

## 2019-05-10 DIAGNOSIS — M545 Low back pain, unspecified: Secondary | ICD-10-CM

## 2019-05-10 MED ORDER — OXYCODONE-ACETAMINOPHEN 7.5-325 MG PO TABS: 1.0000 | ORAL_TABLET | Freq: Three times a day (TID) | ORAL | 0 refills | Status: AC | PRN

## 2019-05-10 MED ORDER — CYCLOBENZAPRINE HCL 5 MG PO TABS: 5.0000 mg | ORAL_TABLET | Freq: Three times a day (TID) | ORAL | 0 refills | Status: DC | PRN

## 2019-05-10 NOTE — Telephone Encounter (Signed)
Pt contacted the pharmacy and the pain medication is still not filled.  The patient is a requesting a call back.

## 2019-05-10 NOTE — Progress Notes (Signed)
I called patient and talked to her regarding her back pain and after she requested for medications refill.    She says that she was slowely getting better but still has pain particularly after she ran out of her medications few days ago.   -Sending prescription for PRN Percocet for 5 days and Flexeril -Recommended to come back to clinic if no improvement in  1 week

## 2019-05-10 NOTE — Telephone Encounter (Signed)
Returned call to patient. States when she received phone call from PCP she was told to check her pharmacy. Spoke with PCP and intention was to send refill today. Verified pharmacy of choice is [facility]. Hubbard Hartshorn, RN, BSN

## 2019-05-26 ENCOUNTER — Other Ambulatory Visit: Payer: Self-pay | Admitting: Internal Medicine

## 2019-05-26 DIAGNOSIS — M545 Low back pain, unspecified: Secondary | ICD-10-CM

## 2019-05-27 NOTE — Telephone Encounter (Addendum)
Pt was rx'd  oxyCODONE-acetaminophen (PERCOCET) 5-325 MG tablet #10 on 05/02/19  oxyCODONE-acetaminophen (PERCOCET) 7.5-325 MG tablet #15 on 05/10/19 Pt requests another refill in addition to the flexeril  .  Will send to pcp for review, please advise.Despina Hidden Cassady7/27/20209:47 AM

## 2019-05-27 NOTE — Telephone Encounter (Signed)
cyclobenzaprine (FLEXERIL) 5 MG tablet, and oxycodone to be filled @  [facility] [place], [place] [phone] (Phone) [phone] (Fax)

## 2019-05-28 ENCOUNTER — Telehealth: Payer: Self-pay | Admitting: Internal Medicine

## 2019-05-28 NOTE — Telephone Encounter (Signed)
Needs refill oxycodone  ;pt contact regarding pharmacy [phone]

## 2019-05-29 ENCOUNTER — Other Ambulatory Visit: Payer: Self-pay | Admitting: Internal Medicine

## 2019-05-30 NOTE — Telephone Encounter (Signed)
PCP returned call to patient on 7/29-see note below (copied and pasted from an in basket message)  Returned call Received: Yesterday Message Contents  Dewayne Hatch, MD  [facility] Triage Nurse Pool        Returned patient call. She has not pick up Flexeril yet but has used some left over and finished Percocet. Still has some pain.  Not appropriate for refilling Percocet at this time. Recommended to use Voltaren gel and Flexeril and come to clinic if no improvement. In 7 days.

## 2019-06-18 ENCOUNTER — Other Ambulatory Visit: Payer: Self-pay

## 2019-06-18 DIAGNOSIS — M545 Low back pain, unspecified: Secondary | ICD-10-CM

## 2019-06-18 DIAGNOSIS — I10 Essential (primary) hypertension: Secondary | ICD-10-CM

## 2019-06-18 DIAGNOSIS — G43009 Migraine without aura, not intractable, without status migrainosus: Secondary | ICD-10-CM

## 2019-06-18 DIAGNOSIS — R42 Dizziness and giddiness: Secondary | ICD-10-CM

## 2019-06-18 MED ORDER — PROPRANOLOL HCL 80 MG PO TABS: 80.0000 mg | ORAL_TABLET | Freq: Three times a day (TID) | ORAL | 1 refills | Status: DC

## 2019-06-18 MED ORDER — CYCLOBENZAPRINE HCL 5 MG PO TABS: 5.0000 mg | ORAL_TABLET | Freq: Three times a day (TID) | ORAL | 0 refills | Status: DC | PRN

## 2019-06-18 MED ORDER — MECLIZINE HCL 25 MG PO TABS: 25.0000 mg | ORAL_TABLET | Freq: Two times a day (BID) | ORAL | 1 refills | Status: DC | PRN

## 2019-06-18 NOTE — Telephone Encounter (Signed)
propranolol (INDERAL) 80 MG tablet    cyclobenzaprine (FLEXERIL) 5 MG tablet   meclizine (ANTIVERT) 25 MG tablet   REFILL REQUEST @ [facility]. -[phone].

## 2019-06-27 ENCOUNTER — Telehealth: Payer: Self-pay

## 2019-06-27 NOTE — Telephone Encounter (Signed)
I do not think that it would be advisable to give her something without a tele visit

## 2019-06-27 NOTE — Telephone Encounter (Signed)
Return pt's call - stated her brother-in-law died in June 03, 2023 and now her sister had a stroke, currently in [facility] hospital. So she's in [place] taking care of the house/dog. Crying - stated she needs something for her nerves and to help her sleep; and she's alone in [place]. Send rx to [facility].

## 2019-06-27 NOTE — Telephone Encounter (Signed)
Requesting to speak with a nurse about problems. Please call pt back.

## 2019-06-27 NOTE — Telephone Encounter (Signed)
Pt stated she does not see mental health anymore and she needs something before Monday.

## 2019-06-27 NOTE — Telephone Encounter (Signed)
No available telehealth appts this time of day and the office is closed tomorrow.

## 2019-06-27 NOTE — Telephone Encounter (Signed)
Patient follows with mental health for her bipolar disorder. Would advise her to call them for advice

## 2019-06-28 MED ORDER — TRAZODONE HCL 100 MG PO TABS: 100.0000 mg | ORAL_TABLET | Freq: Every day | ORAL | 0 refills | Status: DC

## 2019-06-28 NOTE — Telephone Encounter (Signed)
I have put in an order for 10 tablets of trazadone. Will not refill further till patient makes an appointment with [facility]

## 2019-07-01 NOTE — Telephone Encounter (Signed)
LMOM for patient to call back and schedule and appt.

## 2019-07-10 ENCOUNTER — Other Ambulatory Visit: Payer: Self-pay | Admitting: *Deleted

## 2019-07-10 DIAGNOSIS — R42 Dizziness and giddiness: Secondary | ICD-10-CM

## 2019-07-10 DIAGNOSIS — M545 Low back pain, unspecified: Secondary | ICD-10-CM

## 2019-07-11 MED ORDER — MECLIZINE HCL 25 MG PO TABS: 25.0000 mg | ORAL_TABLET | Freq: Two times a day (BID) | ORAL | 1 refills | Status: DC | PRN

## 2019-07-11 MED ORDER — SIMVASTATIN 40 MG PO TABS: 40.0000 mg | ORAL_TABLET | Freq: Every day | ORAL | 3 refills | Status: DC

## 2019-07-11 MED ORDER — CYCLOBENZAPRINE HCL 5 MG PO TABS: 5.0000 mg | ORAL_TABLET | Freq: Three times a day (TID) | ORAL | 0 refills | Status: DC | PRN

## 2019-07-11 NOTE — Telephone Encounter (Signed)
Refill Request-Confirmed Correct Pharmacy with the patient.  Pt has moved need the following Medication filled @ the Drug store listed below. Pt states she she called 2 days ago and would also like a call back.  meclizine (ANTIVERT) 25 MG tablet simvastatin (ZOCOR) 40 MG tablet cyclobenzaprine (FLEXERIL) 5 MG tablet  [facility] [place], [place]

## 2019-08-13 ENCOUNTER — Other Ambulatory Visit: Payer: Self-pay

## 2019-08-13 DIAGNOSIS — M545 Low back pain, unspecified: Secondary | ICD-10-CM

## 2019-08-13 NOTE — Telephone Encounter (Signed)
cyclobenzaprine (FLEXERIL) 5 MG tablet, refill request @  [facility] [place], [place] [phone] (Phone) [phone] (Fax)

## 2019-08-14 ENCOUNTER — Other Ambulatory Visit: Payer: Self-pay | Admitting: Internal Medicine

## 2019-08-15 MED ORDER — CYCLOBENZAPRINE HCL 5 MG PO TABS: 5.0000 mg | ORAL_TABLET | Freq: Three times a day (TID) | ORAL | 0 refills | Status: DC | PRN

## 2019-08-15 NOTE — Telephone Encounter (Signed)
Pt is calling back regarding medicine  [phone]

## 2019-08-26 ENCOUNTER — Other Ambulatory Visit: Payer: Self-pay

## 2019-08-26 DIAGNOSIS — R42 Dizziness and giddiness: Secondary | ICD-10-CM

## 2019-08-26 NOTE — Telephone Encounter (Signed)
meclizine (ANTIVERT) 25 MG tablet   , REFILL REQUEST @  [facility] #[id number] - [place], [place] - [place] [phone] (Phone) [phone] (Fax)

## 2019-08-26 NOTE — Telephone Encounter (Signed)
#  60 with 1 refill sent to [facility] on 07/11/2019. Patient states she did not pick this up and has moved back to [place] and would like this sent to [facility]. [facility] removed from Pharmacy list. Hubbard Hartshorn, BSN, RN-BC

## 2019-08-27 MED ORDER — MECLIZINE HCL 25 MG PO TABS: 25.0000 mg | ORAL_TABLET | Freq: Two times a day (BID) | ORAL | 1 refills | Status: DC | PRN

## 2019-08-28 ENCOUNTER — Other Ambulatory Visit: Payer: Self-pay | Admitting: Internal Medicine

## 2019-08-28 MED ORDER — BUTALBITAL-APAP-CAFFEINE 50-325-40 MG PO TABS: 1.0000 | ORAL_TABLET | Freq: Three times a day (TID) | ORAL | 0 refills | Status: AC | PRN

## 2019-08-29 ENCOUNTER — Telehealth: Payer: Self-pay | Admitting: Internal Medicine

## 2019-08-29 NOTE — Telephone Encounter (Signed)
Patient called after hours pager requesting medication for migraine. She has a history of migraines that are mostly well controlled with rizatriptan and Tylenol as needed. However, she has been experiencing a migraine for the past 2 days and the pain is now extending in to her neck. She has tried her triptan, Tylenol, and flexeril with some response but no resolution of migraine. She denies aura, N/V, new neurological symptoms, or infectious symtpoms. States her HA is similar to her usual migraines. Sent prescription for Fioricet (15 tablets) and instructed her to take 1-2 tablets q8h PRN. Discussed side effects including GI upset and drowsiness/sedation. Advised not to drive when taking this medication. Advised to come to the ED if symptoms worsen or if she develops new neuro symptoms. Also advised she can make an appt in clinic for further evaluation if symptoms do not resolve with current intervention.    Welford Roche, MD  Internal Medicine PGY-3  P [phone]

## 2019-08-30 ENCOUNTER — Other Ambulatory Visit: Payer: Self-pay | Admitting: Internal Medicine

## 2019-08-30 DIAGNOSIS — M545 Low back pain, unspecified: Secondary | ICD-10-CM

## 2019-08-30 MED ORDER — CYCLOBENZAPRINE HCL 5 MG PO TABS: 5.0000 mg | ORAL_TABLET | Freq: Three times a day (TID) | ORAL | 0 refills | Status: DC | PRN

## 2019-08-30 NOTE — Progress Notes (Signed)
   Reason for call:   I received a call from [name] at 5:30 PM requesting refill of her flexaril for pain.   Pertinent Data:   She states she was calling to request a refill on her flexaril which she takes as needed for lower back/hip pain. Per chart review it appears this is refilled monthly for her.    Assessment / Plan / Recommendations:   Refill flexaril 5 mg tid prn - 20 tablets  As always, pt is advised that if symptoms worsen or new symptoms arise, they should go to an urgent care facility or to to ER for further evaluation.   [name] A, DO   08/30/2019, 5:48 PM

## 2019-09-11 ENCOUNTER — Other Ambulatory Visit: Payer: Self-pay | Admitting: *Deleted

## 2019-09-11 ENCOUNTER — Other Ambulatory Visit: Payer: Self-pay

## 2019-09-11 DIAGNOSIS — M545 Low back pain, unspecified: Secondary | ICD-10-CM

## 2019-09-11 NOTE — Telephone Encounter (Signed)
Opened new encounter 

## 2019-09-11 NOTE — Telephone Encounter (Signed)
Pt wants cyclobenzaprine increased to 10mg  and be given a "full script" for the 10mg . She was informed she would need to have an appt w/ pcp or [facility] for this, she said no and was frustrated stated " just get me a refill then"

## 2019-09-11 NOTE — Telephone Encounter (Signed)
Requesting to speak with a nurse about   cyclobenzaprine (FLEXERIL) 5 MG tablet   Please call pt back.  

## 2019-09-12 MED ORDER — CYCLOBENZAPRINE HCL 5 MG PO TABS: 5.0000 mg | ORAL_TABLET | Freq: Three times a day (TID) | ORAL | 0 refills | Status: DC | PRN

## 2019-09-13 ENCOUNTER — Telehealth: Payer: Self-pay | Admitting: *Deleted

## 2019-09-13 NOTE — Telephone Encounter (Signed)
Call to [facility] for PA for Pantoprazole 40 mg tablets.  Pharmacy ran claim as Brand. Changed to Generic and changed to 30 day supply at a time. No PA needed.   Attempts to call patient-message left that the Clinics had called.  Sander Nephew, RN 09/13/2019 10:53 AM.

## 2019-09-14 ENCOUNTER — Telehealth: Payer: Self-pay | Admitting: Internal Medicine

## 2019-09-14 NOTE — Telephone Encounter (Signed)
   Reason for call:   I received a call from [name] at 3:20 PM indicating that she was cutting her fingernails yesterday and unfortunately started bleeding.   Pertinent Data:   None   Assessment / Plan / Recommendations:   Advised to try putting bandages on her finger and seek care if it continues to bleed in order to prevent further complication  As always, pt is advised that if symptoms worsen or new symptoms arise, they should go to an urgent care facility or to to ER for further evaluation.   Wendy Rosenthal, MD   09/14/2019, 3:21 PM

## 2019-09-16 ENCOUNTER — Telehealth: Payer: Self-pay

## 2019-09-16 NOTE — Telephone Encounter (Signed)
Requesting to speak with a nurse about finger pain. Please call pt back

## 2019-09-16 NOTE — Telephone Encounter (Signed)
Pt pulled cuticle off finger that was coming off and it pulled deep so finger is sore now, keep clean, dry and try to prevent further injury. If it becomes swollen, hot, draining or she has fevers call for appt or go to urg care. She was agreeable

## 2019-09-19 ENCOUNTER — Other Ambulatory Visit: Payer: Self-pay | Admitting: Internal Medicine

## 2019-09-19 DIAGNOSIS — G43009 Migraine without aura, not intractable, without status migrainosus: Secondary | ICD-10-CM

## 2019-09-19 DIAGNOSIS — I10 Essential (primary) hypertension: Secondary | ICD-10-CM

## 2019-09-19 MED ORDER — PROPRANOLOL HCL 80 MG PO TABS: 80.0000 mg | ORAL_TABLET | Freq: Three times a day (TID) | ORAL | 1 refills | Status: DC

## 2019-09-19 NOTE — Telephone Encounter (Signed)
Refill Request   propranolol (INDERAL) 80 MG tablet  [id number] - [place], [place] - [place] [place]

## 2019-09-24 ENCOUNTER — Telehealth: Payer: Self-pay | Admitting: Internal Medicine

## 2019-09-24 DIAGNOSIS — G43009 Migraine without aura, not intractable, without status migrainosus: Secondary | ICD-10-CM

## 2019-09-24 MED ORDER — BUTALBITAL-APAP-CAFFEINE 50-325-40 MG PO TABS: 1.0000 | ORAL_TABLET | Freq: Three times a day (TID) | ORAL | 0 refills | Status: DC | PRN

## 2019-09-24 NOTE — Telephone Encounter (Signed)
   Reason for call:   I received a call from [name] at 9:00 PM indicating continued frequent headaches, requesting Fioricet refill.   Pertinent Data:   Patient called on 08/29/2019 due to increased frequency of migraine HA not relieved by her usual Triptan and APAP. She was prescribed PRN Fioricet at hat time.  This medication has been effective for her but she states she has continued to have headaches more frequently that usual and has run out of the Fioricet.   She state her headaches are similar to her usual Migraines and she denies any acute neurologic symptoms.    Assessment / Plan / Recommendations:   Continued increased head ached frequency  Refill of Fioricet provided  Advised to present to clinic if headache frequency does not improve   As always, pt is advised that if symptoms worsen or new symptoms arise, they should go to an urgent care facility or to to ER for further evaluation.   Neva Seat, MD   09/24/2019, 9:07 PM

## 2019-09-30 ENCOUNTER — Other Ambulatory Visit: Payer: Self-pay | Admitting: Internal Medicine

## 2019-09-30 DIAGNOSIS — E039 Hypothyroidism, unspecified: Secondary | ICD-10-CM

## 2019-09-30 MED ORDER — LEVOTHYROXINE SODIUM 112 MCG PO TABS: 112.0000 ug | ORAL_TABLET | Freq: Every day | ORAL | 1 refills | Status: DC

## 2019-09-30 NOTE — Telephone Encounter (Signed)
Refill Request  levothyroxine (SYNTHROID) 112 MCG tablet  [id number] - [place], [place] - [place]

## 2019-10-03 ENCOUNTER — Other Ambulatory Visit: Payer: Self-pay | Admitting: Internal Medicine

## 2019-10-03 DIAGNOSIS — M545 Low back pain, unspecified: Secondary | ICD-10-CM

## 2019-10-03 MED ORDER — CYCLOBENZAPRINE HCL 5 MG PO TABS: 5.0000 mg | ORAL_TABLET | Freq: Three times a day (TID) | ORAL | 0 refills | Status: DC | PRN

## 2019-10-03 NOTE — Telephone Encounter (Signed)
Needs refill on cyclobenzaprine (FLEXERIL) 5 MG tablet  ;pt contact [place],  - [place]

## 2019-10-03 NOTE — Telephone Encounter (Signed)
Called patient and relayed info below. Attempted to schedule appt, however, line went dead. Hubbard Hartshorn, BSN, RN-BC

## 2019-10-03 NOTE — Telephone Encounter (Signed)
Hi, regarding [name] refill request for Flexeril,  I recommend her to be seen in clinic sometime before another refill. (I do not recommend this med to be taken chronically).   Thanks

## 2019-10-07 NOTE — Telephone Encounter (Signed)
Spoke with the pt.  Pt sch an appt to be seen by PCP on 10/17/2019 @ 3:15 pm.

## 2019-10-09 ENCOUNTER — Telehealth: Payer: Self-pay | Admitting: Internal Medicine

## 2019-10-09 DIAGNOSIS — G43009 Migraine without aura, not intractable, without status migrainosus: Secondary | ICD-10-CM

## 2019-10-09 MED ORDER — BUTALBITAL-APAP-CAFFEINE 50-325-40 MG PO TABS: 1.0000 | ORAL_TABLET | Freq: Three times a day (TID) | ORAL | 0 refills | Status: DC | PRN

## 2019-10-09 NOTE — Telephone Encounter (Signed)
Received call to after-hours pager from [name] regarding headache that began around 6:30 this evening. Felt it initially in her neck and has now gone to bilateral temples. Endorses nausea and photophobia. No phonophobia, vomiting, vision changes, syncope, or head trauma. She has a history of migraine headaches and states this feels similar. Requests refill on Fioricet which has been most recently prescribed to her the last couple of months and resolves her symptoms fairly quickly. She has an appt with PCP on 12/17 to discuss headache frequency and need for change in therapy. Will send in refill in the mean time to treat current headache. As always, she was advised to go to the ED if her symptoms fail to improve or worsen.

## 2019-10-17 ENCOUNTER — Other Ambulatory Visit: Payer: Self-pay

## 2019-10-17 ENCOUNTER — Ambulatory Visit (HOSPITAL_COMMUNITY)
Admission: RE | Admit: 2019-10-17 | Discharge: 2019-10-17 | Disposition: A | Discharge status: Home / Self Care | Payer: Medicaid Other | Source: Ambulatory Visit | Attending: Student in an Organized Health Care Education/Training Program | Admitting: Student in an Organized Health Care Education/Training Program

## 2019-10-17 ENCOUNTER — Ambulatory Visit (INDEPENDENT_AMBULATORY_CARE_PROVIDER_SITE_OTHER): Payer: Medicaid Other | Admitting: Internal Medicine

## 2019-10-17 ENCOUNTER — Encounter: Payer: Self-pay | Admitting: Internal Medicine

## 2019-10-17 VITALS — BP 133/87 | HR 67 | Temp 98.6°F | Wt 233.3 lb

## 2019-10-17 DIAGNOSIS — E039 Hypothyroidism, unspecified: Secondary | ICD-10-CM

## 2019-10-17 DIAGNOSIS — Z79899 Other long term (current) drug therapy: Secondary | ICD-10-CM

## 2019-10-17 DIAGNOSIS — I10 Essential (primary) hypertension: Secondary | ICD-10-CM

## 2019-10-17 DIAGNOSIS — M25551 Pain in right hip: Secondary | ICD-10-CM

## 2019-10-17 DIAGNOSIS — M545 Low back pain, unspecified: Secondary | ICD-10-CM

## 2019-10-17 DIAGNOSIS — G8929 Other chronic pain: Secondary | ICD-10-CM | POA: Insufficient documentation

## 2019-10-17 DIAGNOSIS — M549 Dorsalgia, unspecified: Secondary | ICD-10-CM

## 2019-10-17 DIAGNOSIS — G43009 Migraine without aura, not intractable, without status migrainosus: Secondary | ICD-10-CM | POA: Diagnosis present

## 2019-10-17 DIAGNOSIS — Z79890 Hormone replacement therapy (postmenopausal): Secondary | ICD-10-CM

## 2019-10-17 HISTORY — DX: Other chronic pain: G89.29

## 2019-10-17 MED ORDER — METOCLOPRAMIDE HCL 10 MG PO TABS: 10.0000 mg | ORAL_TABLET | Freq: Three times a day (TID) | ORAL | 0 refills | Status: DC | PRN

## 2019-10-17 MED ORDER — CYCLOBENZAPRINE HCL 5 MG PO TABS: 5.0000 mg | ORAL_TABLET | Freq: Three times a day (TID) | ORAL | 0 refills | Status: DC | PRN

## 2019-10-17 MED ORDER — BUTALBITAL-APAP-CAFFEINE 50-325-40 MG PO TABS: 1.0000 | ORAL_TABLET | Freq: Three times a day (TID) | ORAL | 0 refills | Status: DC | PRN

## 2019-10-17 NOTE — Assessment & Plan Note (Signed)
Patient has been on Synthroid 112 mcg QD. Last TSH 5 months ago was normal at 2.4. She denies any weight changes, heat or cold intolerance, hair loss. Will continue current treatment.  -Continue Synthroid 112 mcg QD

## 2019-10-17 NOTE — Assessment & Plan Note (Signed)
Ms. Clodfelter has had chronic back and hip pain. Her back pain is better but she now complains of pain in her right groin. Mostly at the end of the day or when she stands a lot. 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  Tylenol and Flexeril help with the pain. No pain on her back and no radiation to her leg. No numbness, tingling and no lower extremity weakness.   On exam:  Passive movement of right hip including abduction, internal and external rotation, reproduce groin pain which is concerning for intra-articular abnormality of hip and likely osteoarthritis.  She has a negative hip X ray, 6 months ago when she went to ER for back/hip pain, however, not sure if it was a weight bearing/standing imaging.  -Will do a rt weight bearing standing hip X ray  -F/u in clinic in 2-4 weeks (after X ray) or sooner if needed -Can continue Tylenol and Flexeril PRN meanwhile (sent refill)

## 2019-10-17 NOTE — Patient Instructions (Signed)
Thank you for allowing [place] to provide your care today. Today we discussed your hip pain, migraine headache and hypothyroidism. I send refill for Fioricet to be taken during migraine attack. (do not take more than recommended) I also send as needed nausea medicine. Continue Proparanolol as before. Continue Synthroid as before. Please continue rest of your mediations as before.  I order an X ray for your hip to evaluate for arthritis. Please come back in clinic follow up in 2-4 weeks (after taking the X ray) or sooner if needed.    Should you have any questions or concerns please call the internal medicine clinic at [phone].    Thank you

## 2019-10-17 NOTE — Assessment & Plan Note (Deleted)
Patient blood pressure is normal at ... today She is on Propranolol 80 mg TID for Migraine ppx.  -...Marland Kitchen

## 2019-10-17 NOTE — Progress Notes (Signed)
Internal Medicine Clinic Attending  Case discussed with Dr. Masoudi  at the time of the visit.  We reviewed the resident's history and exam and pertinent patient test results.  I agree with the assessment, diagnosis, and plan of care documented in the resident's note.  

## 2019-10-17 NOTE — Assessment & Plan Note (Addendum)
Patient is here for follow up of migrain HA and asking for refill on Fioricet. She reports having more frequent headache in past 3 months (sometimes 3-4 episodes per month) but all controlled well by taken 1 or 2 tablets of Fioricet. She can not recall any trigger for her HA. No change in pattern of her headache which is right side headache that radiates to her neck and associated with photophobia and phonophobia and nausea.   She does not take rizatriptan because it did not work for her.  She is on Propranolol 80 mg TID for Migraine prevention. Tolerated well w/o bradycardia or hypotension.  -Sent refill for Fioricet -Metoclopramide 10 mg TID PRN -Continue Propranolol 80 mg TID -Educated patient to avoid possible triggers. Recommended to try to recognize possible food or environmental factors that may trigger her migraine attacks and avoid them.

## 2019-10-17 NOTE — Progress Notes (Signed)
    CC: Follow up of migraine heafache  HPI:  [name] is a 48 y.o. female with PMHx as documented below, presented for follow up of migraine headache and hip pain. Please refer to problem based charting for further details and assessment of plan of current problem and chronic medical conditions.   Past Medical History:  Diagnosis Date  . Bipolar 1 disorder ([place])    Followed by Mental Health.  All psychiatric medications prescribed by Mental Health.  Stable for many years.    . 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 Dyspareunia 03/04/11  . Hx of measles   . Hypothyroidism    Hypothyroidism since 8th grade.  Managed by Dr. Buddy Duty, Endocrinology.  On synthroid.  No prior thyroid surgery/ablation.   . Migraines   . Monilia infection 12/31/10  . Vaginal atrophy 03/04/11   Review of Systems:  ROS  Physical Exam:  Vitals:   10/17/19 1412  BP: 133/87  Pulse: 67  Temp: 98.6 F (37 C)  TempSrc: Oral  SpO2: 99%  Weight: 233 lb 4.8 oz (105.8 kg)   Physical Exam  Constitutional: Obese lady,No acute distress.  Head: Normocephalic and atraumatic.  Eyes: Conjunctivae are normal, EOM nl Cardiovascular: RRR, nl S1S2, no murmur,  no LEE Respiratory: Effort normal and breath sounds normal. No respiratory distress. No wheezes.  GI: Soft. Bowel sounds are normal. No distension. There is no tenderness.  Neurological: Is alert and oriented x 3  Skin: Not diaphoretic. No erythema.  Psychiatric: Normal mood and affect.  MSK:  Right hip:  No deformity or swelling No tenderness to palpation at lateral of hip Hip ROM with no click or popping but reproduce groin pain with hip flexion, internal rotation and lateral rotation (+FABER and FADIR test) SLR is negative. Sensation to touch is intact through entire lower extremity Motor is 5/5 in proximal and distal muscles of right lower extremity  Assessment & Plan:   See Encounters Tab for problem based  charting.  Patient discussed with Dr. Evette Doffing

## 2019-10-26 ENCOUNTER — Telehealth: Payer: Self-pay | Admitting: Internal Medicine

## 2019-10-26 ENCOUNTER — Other Ambulatory Visit: Payer: Self-pay | Admitting: Internal Medicine

## 2019-10-26 MED ORDER — MAGIC MOUTHWASH W/LIDOCAINE: 5.0000 mL | Freq: Four times a day (QID) | ORAL | 0 refills | Status: DC | PRN

## 2019-10-26 NOTE — Telephone Encounter (Signed)
Called [name] and confirmed that her prescription received by pharmacy.

## 2019-10-26 NOTE — Telephone Encounter (Signed)
   Reason for call:   I received a call from Ms. Billey Gosling at  PM indicating blister under her tongue.    Pertinent Data:   Blister under tongue which is painful, started 3-4 days ago. She reports having white lesions under her tongue. No discharge, no tooth ache. No systemic symptoms. She denies eating spicy or hot food or drink. This is the first time this happens to her.   Assessment / Plan / Recommendations:   Oral blister/lesions under the tongue, can be aphthous ulcer-canker sore. Giving some mouse wash to control pain and if no improvement in next 2-3 days, will follow up in clinic for physical examination.   As always, pt is advised that if symptoms worsen or new symptoms arise, they should go to an urgent care facility or to to ER for further evaluation.   Dewayne Hatch, MD   10/26/2019, 2:30 PM

## 2019-10-28 ENCOUNTER — Other Ambulatory Visit: Payer: Self-pay | Admitting: Internal Medicine

## 2019-10-28 DIAGNOSIS — R42 Dizziness and giddiness: Secondary | ICD-10-CM

## 2019-10-30 ENCOUNTER — Encounter: Payer: Self-pay | Admitting: Internal Medicine

## 2019-10-30 ENCOUNTER — Ambulatory Visit: Payer: Medicaid Other

## 2019-10-30 ENCOUNTER — Other Ambulatory Visit: Payer: Self-pay | Admitting: Internal Medicine

## 2019-10-30 DIAGNOSIS — H9209 Otalgia, unspecified ear: Secondary | ICD-10-CM

## 2019-10-30 MED ORDER — AMOXICILLIN 875 MG PO TABS: 875.0000 mg | ORAL_TABLET | Freq: Two times a day (BID) | ORAL | 0 refills | Status: AC

## 2019-10-30 NOTE — Progress Notes (Signed)
   Reason for call:   I received a call from [name] at 11:35pm PM indicating ongoing blister under her tongue and new onset ear pain.   Pertinent Data:   She states she was given magic mouthwash for a blister under her tongue one week ago. The blister has not gotten any better, but the pain is controlled with the mouthwash. However, she has started to develop worsening pain in the right ear several days ago, same side as the blister. She denies fever, chills, drainage from the ear, changes in hearing, headache, sore throat, or cough. She states the pain is deep in her ear. She does endorse some sinus pain under right eye. She has tried tylenol and her fioricet, which has not helped. She has a history of getting blisters when she is stressed but not of ear aches. She denies dizziness or tinnitus or nausea. She is unable to take NSAIDs due to GI upset.    Assessment / Plan / Recommendations:   Amoxicillin 875mg  q12h for 5 days   As our clinic is closed for the holiday, recommended she go to urgent care in the am to be evaluated due to limitations of evaluation over the phone and her ongoing pain   Continue tylenol as needed  As always, pt is advised that if symptoms worsen or new symptoms arise, they should go to an urgent care facility or to to ER for further evaluation.   [name] A, DO   10/30/2019, 11:41 PM

## 2019-11-04 ENCOUNTER — Other Ambulatory Visit: Payer: Self-pay | Admitting: Internal Medicine

## 2019-11-04 DIAGNOSIS — G43009 Migraine without aura, not intractable, without status migrainosus: Secondary | ICD-10-CM

## 2019-11-04 DIAGNOSIS — M545 Low back pain, unspecified: Secondary | ICD-10-CM

## 2019-11-04 NOTE — Telephone Encounter (Signed)
Dr Maryla Morrow, please take note that pt has ask pharmacy to request 600 of the fioricet and # 270 of the flexeril, stating she wants 90 days worth, I will let you change these amounts, as you had previously given #20 fioricet and #15 of flexeril

## 2019-11-05 NOTE — Telephone Encounter (Signed)
Needs refill on   cyclobenzaprine (FLEXERIL) 5 MG tablet  Fioricet    [id number] - [place], [place] - [address] AT [facility] OF [address]

## 2019-11-05 NOTE — Telephone Encounter (Signed)
   Reason for call:   I received a call from [name] at 7:05 PM indicating she has requested for refill of her medications.   Pertinent Data:   Patient asks for refill for flexeril and Fioricet. She is asymptomatic now but takes PRN Flexeril for her leg pain and Fioricet for headache.    Assessment / Plan / Recommendations:   Sent refill for 1 week supply for Fioricet and Flexeril PRN. Explained to patient that I do not recommend continiously taking Flexeril. She expresses that she is aware of that, no change in pattern of her pain and will follow up in clinic for her chronic leg/hip pain and spasm.   As always, pt is advised that if symptoms worsen or new symptoms arise, they should go to an urgent care facility or to to ER for further evaluation.   Wendy Pretty, MD   11/05/2019, 8:03 PM

## 2019-11-12 ENCOUNTER — Telehealth: Payer: Self-pay | Admitting: Internal Medicine

## 2019-11-12 NOTE — Telephone Encounter (Signed)
   Reason for call:   I received a call from [name] at 9:06 PM PM indicating needs for refill on antibiotic for blister in her mouth and ear discomfort.   Pertinent Data:   She has had blister under her tongue and some ear discomfort. Started about 10 days ago. Received mouth wash and then Amoxicillin has been sent for her after a telephone encounter during holiday. She has been advised to be seen in urgent care then. (Per patient, in urgent care, she was recommended to continue antibiotic). She mentions that oral blisters and ear discomfit continuous to bother her and she asks me to send mor Amoxicilin for her.    Assessment / Plan / Recommendations:   Oral lesion (blisters) and ear discomfort. Aphthous ulcer-canker sore initially considered but did not respond to mouthwash and also developed ear discomfort that not responded completely to Amoxiciline course. She needs to be examined and evaluated in person for other etiology such as Herpes infection or AOM resistant to Amoxicillin. Recommended patient to come to [facility] tomorrow for evaluation and treatment. She agrees with the plan.   As always, pt is advised that if symptoms worsen or new symptoms arise, they should go to an urgent care facility or to to ER for further evaluation.   Chevis Pretty, MD   11/12/2019, 9:12 PM

## 2019-11-16 ENCOUNTER — Encounter (HOSPITAL_COMMUNITY): Payer: Self-pay

## 2019-11-16 ENCOUNTER — Ambulatory Visit (HOSPITAL_COMMUNITY)
Admission: EM | Admit: 2019-11-16 | Discharge: 2019-11-16 | Disposition: A | Discharge status: Home / Self Care | Payer: Medicaid Other | Attending: Family Medicine | Admitting: Family Medicine

## 2019-11-16 ENCOUNTER — Other Ambulatory Visit: Payer: Self-pay

## 2019-11-16 DIAGNOSIS — K121 Other forms of stomatitis: Secondary | ICD-10-CM

## 2019-11-16 MED ORDER — CLOTRIMAZOLE 10 MG MT TROC: 10.0000 mg | Freq: Every day | OROMUCOSAL | 0 refills | Status: DC

## 2019-11-16 NOTE — ED Provider Notes (Signed)
[place]   [id number] 11/16/19 Arrival Time: 1010  ASSESSMENT & PLAN:  1. Denture stomatitis     No sign of dental infection. Discussed cleaning dentures well daily.  Begin: Meds ordered this encounter  Medications  . clotrimazole (MYCELEX) 10 MG troche    Sig: Take 1 tablet (10 mg total) by mouth 5 (five) times daily.    Dispense:  70 Troche    Refill:  0   Follow-up Information    Masoudi, Dorthula Rue, MD.   Specialty: Internal Medicine Why: If worsening or failing to improve as anticipated. Contact information: [address] [place] [address] [phone]            Reviewed expectations re: course of current medical issues. Questions answered. Outlined signs and symptoms indicating need for more acute intervention. Patient verbalized understanding. After Visit Summary given.   SUBJECTIVE:  [name] is a 49 y.o. female who reports gradual onset of pain of her right lower gums and side of tongue. Described as burning and sore. Present for the past month. Evisit with her provider; prescribed antibiotics and magic mouthwash but this has not helped much. Fever: absent. Tolerating PO intake but reports pain with chewing. Normal swallowing. She does not see a dentist regularly. No neck swelling or pain. OTC analgesics without relief.  ROS: As per HPI.  OBJECTIVE: Vitals:   11/16/19 1033  BP: 122/87  Pulse: 65  Resp: 17  Temp: 98.6 F (37 C)  TempSrc: Oral  SpO2: 100%    General appearance: alert; no distress HENT: normocephalic; atraumatic; wears dentures; lower right gums and right edge of tongue with white coating and underlying mild erythema; no blistering or ulcerations appreciated Neck: supple without LAD; FROM; trachea midline Lungs: speaks full sentences without difficulty; unlabored Skin: warm and dry Psychological: alert and cooperative; normal mood and affect  Allergies  Allergen Reactions  . Codeine Nausea And  Vomiting and Rash  . Morphine Shortness Of Breath  . Sulfa Antibiotics Shortness Of Breath  . Sulfonamide Derivatives Shortness Of Breath, Nausea Only and Rash  . Meloxicam Nausea Only  . Morphine And Related Other (See Comments)    Makes me loopy and hallucinates  . Naproxen Nausea Only and Rash    Past Medical History:  Diagnosis Date  . Bipolar 1 disorder ([place])    Followed by Mental Health.  All psychiatric medications prescribed by Mental Health.  Stable for many years.    . 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 Dyspareunia 03/04/11  . Hx of measles   . Hypothyroidism    Hypothyroidism since 8th grade.  Managed by Dr. Buddy Duty, Endocrinology.  On synthroid.  No prior thyroid surgery/ablation.   . Migraines   . Monilia infection 12/31/10  . Vaginal atrophy 03/04/11   Social History   Socioeconomic History  . Marital status: Legally Separated    Spouse name: Not on file  . Number of children: Not on file  . Years of education: Not on file  . Highest education level: Not on file  Occupational History  . Not on file  Tobacco Use  . Smoking status: Current Every Day Smoker    Packs/day: 0.30    Types: Cigarettes  . Smokeless tobacco: Never Used  . Tobacco comment:  trying  to cut back and then out  Substance and Sexual Activity  . Alcohol use: No    Alcohol/week: 0.0 standard drinks  .  Drug use: No  . Sexual activity: Not on file    Comment: hysterectomy  Other Topics Concern  . Not on file  Social History Narrative  . Not on file   Social Determinants of Health   Financial Resource Strain:   . Difficulty of Paying Living Expenses: Not on file  Food Insecurity:   . Worried About Programme researcher, broadcasting/film/video in the Last Year: Not on file  . Ran Out of Food in the Last Year: Not on file  Transportation Needs:   . Lack of Transportation (Medical): Not on file  . Lack of Transportation (Non-Medical): Not on file  Physical Activity:   . Days of  Exercise per Week: Not on file  . Minutes of Exercise per Session: Not on file  Stress:   . Feeling of Stress : Not on file  Social Connections:   . Frequency of Communication with Friends and Family: Not on file  . Frequency of Social Gatherings with Friends and Family: Not on file  . Attends Religious Services: Not on file  . Active Member of Clubs or Organizations: Not on file  . Attends Banker Meetings: Not on file  . Marital Status: Not on file  Intimate Partner Violence:   . Fear of Current or Ex-Partner: Not on file  . Emotionally Abused: Not on file  . Physically Abused: Not on file  . Sexually Abused: Not on file   Family History  Problem Relation Age of Onset  . Hypertension Mother   . Other Father   . Cancer Maternal Aunt    Past Surgical History:  Procedure Laterality Date  . ABDOMINAL HYSTERECTOMY  2000  . APPENDECTOMY    . CESAREAN SECTION  1998  . CHOLECYSTECTOMY    . left elbow    . 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 TONSILLECTOMY    . WISDOM TOOTH EXTRACTION       Mardella Layman, MD 11/16/19 1106

## 2019-11-16 NOTE — ED Triage Notes (Signed)
Patient presents to Urgent Care with complaints of blisters on the inside of her mouth since almost a month ago. Patient reports she has been given antibiotics and magic mouthwash but it has not helped.

## 2019-11-17 ENCOUNTER — Telehealth: Payer: Self-pay | Admitting: Internal Medicine

## 2019-11-17 ENCOUNTER — Other Ambulatory Visit: Payer: Self-pay | Admitting: Internal Medicine

## 2019-11-17 DIAGNOSIS — K120 Recurrent oral aphthae: Secondary | ICD-10-CM

## 2019-11-17 MED ORDER — LIDOCAINE 5 % EX OINT: 1.0000 "application " | TOPICAL_OINTMENT | CUTANEOUS | 0 refills | Status: DC | PRN

## 2019-11-17 NOTE — Telephone Encounter (Signed)
   Reason for call:   I received a call from Ms. Liston Alba at 0800 PM indicating need for pain meds.   Pertinent Data:   Mentions that she has been having significant pain in her mouth that has been ongoing for the past month. States that she has had burning soreness and was given medications including antibiotics and magic mouthwash. Mentions that pain is currently not controlled and is the 'worst pain I've ever felt.' States that she was seen in ED yesterday and states she was discharged w/o any medications. States the pain is worsening and is in severe distress. Denies any fevers, chills, nausea, vomiting.   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 Discussed with Ms.Fenstermacher regarding coming to [facility] for in-person evaluation, which she states she does not think she can wait due to the clinic being closed for [facility]'s day. Discussed option of supportive care w/ numbing medications vs coming to clinic. She states that she would be willing to try both. - Xylocaine ointment sent - Sent msg to front desk for possible appt on Tuesday  As always, pt is advised that if symptoms worsen or new symptoms arise, they should go to an urgent care facility or to to ER for further evaluation.   Theotis Barrio, MD   11/17/2019, 8:41 PM

## 2019-11-20 ENCOUNTER — Encounter: Payer: Self-pay | Admitting: Internal Medicine

## 2019-11-20 ENCOUNTER — Ambulatory Visit: Payer: Medicaid Other

## 2019-11-21 ENCOUNTER — Other Ambulatory Visit: Payer: Self-pay | Admitting: Internal Medicine

## 2019-11-21 DIAGNOSIS — M545 Low back pain, unspecified: Secondary | ICD-10-CM

## 2019-11-21 DIAGNOSIS — G43009 Migraine without aura, not intractable, without status migrainosus: Secondary | ICD-10-CM

## 2019-11-22 ENCOUNTER — Encounter: Payer: Self-pay | Admitting: Internal Medicine

## 2019-11-22 ENCOUNTER — Telehealth: Payer: Self-pay | Admitting: Internal Medicine

## 2019-11-22 ENCOUNTER — Other Ambulatory Visit: Payer: Self-pay | Admitting: Internal Medicine

## 2019-11-22 DIAGNOSIS — J40 Bronchitis, not specified as acute or chronic: Secondary | ICD-10-CM

## 2019-11-22 MED ORDER — ALBUTEROL SULFATE HFA 108 (90 BASE) MCG/ACT IN AERS: 1.0000 | INHALATION_SPRAY | Freq: Four times a day (QID) | RESPIRATORY_TRACT | 2 refills | Status: DC | PRN

## 2019-11-22 NOTE — Telephone Encounter (Signed)
   Reason for call:   I received a call from [name] at 8 PM indicating need for medication refill.   Pertinent Data:   [name] called stating that she needs refills for her albuterol inhaler. She mentions that she just noted that her albuterol inhaler is out and she needs new ones. Denies any fevers, chills, sick contact. Currently not in any respiratory distress   Assessment / Plan / Recommendations:   Per chart review, had prior episodes of allergic rhinitis and suspected asthma although she has no PFTs on file. Albuterol inhaler listed on home medications. Will provide refill as requested.  As always, pt is advised that if symptoms worsen or new symptoms arise, they should go to an urgent care facility or to to ER for further evaluation.   Theotis Barrio, MD   11/22/2019, 8:06 PM

## 2019-11-22 NOTE — Progress Notes (Signed)
I received refill request for Fioricet and Flexeril.  I changed the sig and # to be taken less frequent. We need to taper down these meds as I am concern about dependency. We have discussed this with Ms. Salmons before and no further refill until we see her in clinic and work on preventive meds for her migraine and managing her back pain.

## 2019-12-02 ENCOUNTER — Other Ambulatory Visit: Payer: Self-pay | Admitting: *Deleted

## 2019-12-02 ENCOUNTER — Telehealth: Payer: Self-pay | Admitting: Internal Medicine

## 2019-12-02 DIAGNOSIS — R42 Dizziness and giddiness: Secondary | ICD-10-CM

## 2019-12-02 MED ORDER — MECLIZINE HCL 25 MG PO TABS: 25.0000 mg | ORAL_TABLET | Freq: Two times a day (BID) | ORAL | 0 refills | Status: DC | PRN

## 2019-12-06 ENCOUNTER — Other Ambulatory Visit: Payer: Self-pay | Admitting: Internal Medicine

## 2019-12-06 ENCOUNTER — Telehealth: Payer: Self-pay | Admitting: Internal Medicine

## 2019-12-06 DIAGNOSIS — G43009 Migraine without aura, not intractable, without status migrainosus: Secondary | ICD-10-CM

## 2019-12-06 MED ORDER — BUTALBITAL-APAP-CAFFEINE 50-325-40 MG PO TABS: ORAL_TABLET | ORAL | 0 refills | Status: DC

## 2019-12-06 NOTE — Telephone Encounter (Signed)
   Reason for call:   I received a call from Ms. Liston Alba at 2224 PM indicating need for a refill of fioricet due to migraine.  She reports typical migraine symptoms no new visual changes, weakness, facial droop.  She placed a refill request for fioricet but PCP has not yet responded.  Reviewing chart looks like pcp worries about dependency, discussed with patient, she says this is the first she's hearing of this.  I let her know I that this is not the best medicine for long term migraine treatment and that she will need to discuss this with her doctor further.  Says she takes 1 tab twice weekly.   Pertinent Data:   Migraine history   Assessment / Plan / Recommendations:   Agree with pcp needs to taper off fioricet, decreased dose by 50% 4 tablets month supply.  She will need an appointment with pcp to discuss alternative therapies.  As always, pt is advised that if symptoms worsen or new symptoms arise, they should go to an urgent care facility or to to ER for further evaluation.   Angelita Ingles, MD   12/06/2019, 10:23 PM

## 2019-12-06 NOTE — Telephone Encounter (Signed)
REFILL REQUEST  butalbital-acetaminophen-caffeine (FIORICET) 50-325-40 MG tablet  [facility] #[id number] - [place], [place] - [address] AT [facility] OF [address] [phone] (Phone) [phone] (Fax)

## 2019-12-09 ENCOUNTER — Telehealth: Payer: Self-pay

## 2019-12-09 NOTE — Telephone Encounter (Signed)
Spoke with the patient.  Pt only wants to see her PCP.  Patient sch for 12/19/2019.

## 2019-12-09 NOTE — Telephone Encounter (Signed)
Requesting to speak with a nurse about something. Please call pt back.  °

## 2019-12-13 ENCOUNTER — Other Ambulatory Visit: Payer: Self-pay | Admitting: Internal Medicine

## 2019-12-13 DIAGNOSIS — G43009 Migraine without aura, not intractable, without status migrainosus: Secondary | ICD-10-CM

## 2019-12-13 NOTE — Telephone Encounter (Signed)
Need refill on migrane medicine; pt contact [phone]    [facility][id number] - [place], [place] - [address] AT [facility] OF [address]

## 2019-12-14 ENCOUNTER — Other Ambulatory Visit: Payer: Self-pay | Admitting: Internal Medicine

## 2019-12-14 DIAGNOSIS — G43009 Migraine without aura, not intractable, without status migrainosus: Secondary | ICD-10-CM

## 2019-12-14 MED ORDER — BUTALBITAL-APAP-CAFFEINE 50-325-40 MG PO TABS: ORAL_TABLET | ORAL | 0 refills | Status: DC

## 2019-12-14 NOTE — Progress Notes (Signed)
   Reason for call:   I received a call from Ms. Liston Alba at [phone] indicating that she has been experiencing severe migraine since yesterday morning. Pain is 8/10 with intermittent nausea. she denies vomiting or vision changes   Pertinent Data:   Migraine   Assessment / Plan / Recommendations:   Acute on chronic migraine. Refilled home Fioricet #4. She has appointment with her PCP next week  As always, pt is advised that if symptoms worsen or new symptoms arise, they should go to an urgent care facility or to to ER for further evaluation.   Yvette Rack, MD   12/14/2019, 7:44 PM

## 2019-12-17 ENCOUNTER — Other Ambulatory Visit: Payer: Self-pay | Admitting: Internal Medicine

## 2019-12-17 ENCOUNTER — Telehealth: Payer: Self-pay | Admitting: *Deleted

## 2019-12-17 DIAGNOSIS — G43009 Migraine without aura, not intractable, without status migrainosus: Secondary | ICD-10-CM

## 2019-12-17 MED ORDER — BUTALBITAL-APAP-CAFFEINE 50-325-40 MG PO TABS: ORAL_TABLET | ORAL | 0 refills | Status: DC

## 2019-12-17 NOTE — Telephone Encounter (Signed)
Pt calls and wants her migraine med called in today, she states her appt with you has been cancelled due to inclemate weather and rescheduled for 2/25, she states she cannot wait that long and needs it today. maybe just enough to last til appt?

## 2019-12-17 NOTE — Telephone Encounter (Signed)
Called pt started to inform her that you had sent 3 tabs in to last until appt, before nurse could finish she stated "3 pills, what do you mean, that's just great, what do you mean. I attempted to speak to pt again to relay message and she continued" im just not going to go pick them up, 3 pills for over a weak, no, I wont pick them up" she then hung up. Called dr Maryla Morrow and made her aware. Pt is always very discourteous when she calls to staff

## 2019-12-17 NOTE — Progress Notes (Signed)
I received refill request and a phone call from [name] for refilling Fioricet for her migraine headache. I have been concern about dependency and have tried to taper down that. Unfortunately she has not come to clinic despite recommendations. She did not make it for last appointment due to cold incelement weather  and asks for few pills until next appointment. I send refill for Fioricet # 3. She will need to be evaluated and we should decide about long term treatment plan for her migraine before further refills as I am concern about dependency to Fioricet.

## 2019-12-19 ENCOUNTER — Encounter: Payer: Medicaid Other | Admitting: Internal Medicine

## 2019-12-26 ENCOUNTER — Ambulatory Visit (INDEPENDENT_AMBULATORY_CARE_PROVIDER_SITE_OTHER): Payer: Medicaid Other | Admitting: Internal Medicine

## 2019-12-26 ENCOUNTER — Other Ambulatory Visit: Payer: Self-pay

## 2019-12-26 DIAGNOSIS — G43909 Migraine, unspecified, not intractable, without status migrainosus: Secondary | ICD-10-CM

## 2019-12-26 DIAGNOSIS — G43009 Migraine without aura, not intractable, without status migrainosus: Secondary | ICD-10-CM

## 2019-12-26 MED ORDER — BUTALBITAL-APAP-CAFFEINE 50-325-40 MG PO TABS: ORAL_TABLET | ORAL | 0 refills | Status: DC

## 2019-12-26 NOTE — Progress Notes (Signed)
  [facility] Internal Medicine Residency Telephone Encounter Continuity Care Appointment  HPI:   This telephone encounter was created for [name] on 12/26/2019 for the following purpose/cc migraine headache.   Past Medical History:  Past Medical History:  Diagnosis Date  . Bipolar 1 disorder ([facility])    Followed by Mental Health.  All psychiatric medications prescribed by Mental Health.  Stable for many years.    . Deep venous thrombosis of leg ([facility]) 2012   completed year of coumadin   . Dyspareunia 03/04/11  . Hx of measles   . Hypothyroidism    Hypothyroidism since 8th grade.  Managed by Dr. Sharl Ma, Endocrinology.  On synthroid.  No prior thyroid surgery/ablation.   . Migraines   . Monilia infection 12/31/10  . Vaginal atrophy 03/04/11      ROS:      Assessment / Plan / Recommendations:   Please see A&P under problem oriented charting for assessment of the patient's acute and chronic medical conditions.   As always, pt is advised that if symptoms worsen or new symptoms arise, they should go to an urgent care facility or to to ER for further evaluation.   Consent and Medical Decision Making:   Patient discussed with Dr. Oswaldo Done  This is a telephone encounter between [name] and [name] on 12/26/2019 for f/u of migraine headache and medication refill request. The visit was conducted with the patient located at home and [facility] at [facility]. The patient's identity was confirmed using their DOB and current address. The patient has consented to being evaluated through a telephone encounter and understands the associated risks (an examination cannot be done and the patient may need to come in for an appointment) / benefits (allows the patient to remain at home, decreasing exposure to coronavirus). I personally spent 7 minutes on medical discussion.

## 2019-12-26 NOTE — Assessment & Plan Note (Addendum)
(  Televisit) Ms. Provencher reports having 2 Migraine attack per week, her headaches becomes controlled within few hours if she takes Fioricet early. She is asking for Fioricet refill.  We have discussed the side effect and the risk of dependency. She endorses awareness about she should just take it during the attack and avoid overuse. She takes average of 4 tablets weekly. Her headache responses very well to that. She has tried Sumatriptan before w/o any effect. She has not picked up Metoclopramide yet. Takes Propranolol regularly.  I discuss Ms. Tufano Migraine headache regimen with Dr. Oswaldo Done. Considering she has not shown side effect with Fioricet and as her attacks are not controlled with other meds she tried before, we can continue with refilling Fioricet. (4 tab/week and monthly prescription of 20 tablet).  No concern for pregnancy while on Fioricet as she had hysterectomy before.  -Sent refill for Fioricet # 20 (one month supply)

## 2019-12-31 ENCOUNTER — Other Ambulatory Visit: Payer: Self-pay | Admitting: Internal Medicine

## 2019-12-31 DIAGNOSIS — R42 Dizziness and giddiness: Secondary | ICD-10-CM

## 2020-01-02 ENCOUNTER — Other Ambulatory Visit: Payer: Self-pay | Admitting: Internal Medicine

## 2020-01-07 NOTE — Progress Notes (Signed)
Internal Medicine Clinic Attending  Case discussed with Dr. Masoudi  at the time of the visit.  We reviewed the resident's history and pertinent patient test results.  I agree with the assessment, diagnosis, and plan of care documented in the resident's note.  

## 2020-01-27 ENCOUNTER — Other Ambulatory Visit: Payer: Self-pay | Admitting: Internal Medicine

## 2020-01-27 DIAGNOSIS — G43009 Migraine without aura, not intractable, without status migrainosus: Secondary | ICD-10-CM

## 2020-02-03 ENCOUNTER — Other Ambulatory Visit: Payer: Self-pay | Admitting: Internal Medicine

## 2020-02-03 DIAGNOSIS — R42 Dizziness and giddiness: Secondary | ICD-10-CM

## 2020-02-27 ENCOUNTER — Other Ambulatory Visit: Payer: Self-pay | Admitting: Internal Medicine

## 2020-02-27 DIAGNOSIS — G43009 Migraine without aura, not intractable, without status migrainosus: Secondary | ICD-10-CM

## 2020-03-01 ENCOUNTER — Other Ambulatory Visit: Payer: Self-pay | Admitting: Internal Medicine

## 2020-03-01 DIAGNOSIS — R42 Dizziness and giddiness: Secondary | ICD-10-CM

## 2020-03-14 ENCOUNTER — Other Ambulatory Visit: Payer: Self-pay | Admitting: Internal Medicine

## 2020-03-14 DIAGNOSIS — J302 Other seasonal allergic rhinitis: Secondary | ICD-10-CM

## 2020-03-17 ENCOUNTER — Other Ambulatory Visit: Payer: Self-pay | Admitting: *Deleted

## 2020-03-17 DIAGNOSIS — J302 Other seasonal allergic rhinitis: Secondary | ICD-10-CM

## 2020-03-23 ENCOUNTER — Other Ambulatory Visit: Payer: Self-pay | Admitting: Internal Medicine

## 2020-03-23 DIAGNOSIS — G43009 Migraine without aura, not intractable, without status migrainosus: Secondary | ICD-10-CM

## 2020-03-26 ENCOUNTER — Other Ambulatory Visit: Payer: Self-pay | Admitting: *Deleted

## 2020-03-26 DIAGNOSIS — E039 Hypothyroidism, unspecified: Secondary | ICD-10-CM

## 2020-03-27 MED ORDER — LEVOTHYROXINE SODIUM 112 MCG PO TABS: 112.0000 ug | ORAL_TABLET | Freq: Every day | ORAL | 1 refills | Status: DC

## 2020-04-02 ENCOUNTER — Other Ambulatory Visit: Payer: Self-pay | Admitting: Internal Medicine

## 2020-04-02 DIAGNOSIS — R42 Dizziness and giddiness: Secondary | ICD-10-CM

## 2020-04-07 ENCOUNTER — Other Ambulatory Visit: Payer: Self-pay | Admitting: Internal Medicine

## 2020-04-07 DIAGNOSIS — R42 Dizziness and giddiness: Secondary | ICD-10-CM

## 2020-04-07 MED ORDER — MECLIZINE HCL 25 MG PO TABS: ORAL_TABLET | ORAL | 0 refills | Status: DC

## 2020-04-27 ENCOUNTER — Other Ambulatory Visit: Payer: Self-pay | Admitting: Internal Medicine

## 2020-04-27 DIAGNOSIS — G43009 Migraine without aura, not intractable, without status migrainosus: Secondary | ICD-10-CM

## 2020-05-05 ENCOUNTER — Other Ambulatory Visit: Payer: Self-pay | Admitting: Internal Medicine

## 2020-05-05 DIAGNOSIS — R42 Dizziness and giddiness: Secondary | ICD-10-CM

## 2020-05-07 ENCOUNTER — Other Ambulatory Visit: Payer: Self-pay | Admitting: *Deleted

## 2020-05-07 DIAGNOSIS — G43009 Migraine without aura, not intractable, without status migrainosus: Secondary | ICD-10-CM

## 2020-05-07 DIAGNOSIS — I10 Essential (primary) hypertension: Secondary | ICD-10-CM

## 2020-05-07 MED ORDER — PROPRANOLOL HCL 80 MG PO TABS: 80.0000 mg | ORAL_TABLET | Freq: Three times a day (TID) | ORAL | 1 refills | Status: DC

## 2020-05-24 ENCOUNTER — Other Ambulatory Visit: Payer: Self-pay | Admitting: Internal Medicine

## 2020-05-24 DIAGNOSIS — G43009 Migraine without aura, not intractable, without status migrainosus: Secondary | ICD-10-CM

## 2020-05-26 ENCOUNTER — Telehealth: Payer: Self-pay | Admitting: *Deleted

## 2020-05-26 NOTE — Telephone Encounter (Signed)
Pt called stating she could not get her butalbital because doctor was not registered w/ medicaid, called pharm, gave them pcp info and script went thru, they will call pt

## 2020-06-07 ENCOUNTER — Other Ambulatory Visit: Payer: Self-pay | Admitting: Internal Medicine

## 2020-06-07 DIAGNOSIS — R42 Dizziness and giddiness: Secondary | ICD-10-CM

## 2020-06-08 ENCOUNTER — Encounter: Payer: Medicaid Other | Admitting: Internal Medicine

## 2020-06-08 NOTE — Progress Notes (Deleted)
   CC: ***  HPI:  [name] is a 49 y.o. female with PMHx as documented below, presented with ***. Please refer to problem based charting for further details and assessment and plan of current problem and chronic medical conditions.   Past Medical History:  Diagnosis Date  . Bipolar 1 disorder ([facility])    Followed by Mental Health.  All psychiatric medications prescribed by Mental Health.  Stable for many years.    . Deep venous thrombosis of leg ([facility]) 2012   completed year of coumadin   . Dyspareunia 03/04/11  . Hx of measles   . Hypothyroidism    Hypothyroidism since 8th grade.  Managed by Dr. Sharl Ma, Endocrinology.  On synthroid.  No prior thyroid surgery/ablation.   . Migraines   . Monilia infection 12/31/10  . Vaginal atrophy 03/04/11   Review of Systems:  ***  Physical Exam:  There were no vitals filed for this visit. ***  Assessment & Plan:   See Encounters Tab for problem based charting.  Patient {GC/GE:3044014::"discussed with","seen with"} Dr. {NAMES:[id number]::"Butcher","Guilloud","Hoffman","Mullen","Narendra","Raines","Vincent"}

## 2020-06-15 ENCOUNTER — Encounter: Payer: Self-pay | Admitting: Internal Medicine

## 2020-06-15 ENCOUNTER — Ambulatory Visit (INDEPENDENT_AMBULATORY_CARE_PROVIDER_SITE_OTHER): Payer: Medicaid Other | Admitting: Internal Medicine

## 2020-06-15 ENCOUNTER — Other Ambulatory Visit: Payer: Self-pay | Admitting: Internal Medicine

## 2020-06-15 ENCOUNTER — Other Ambulatory Visit: Payer: Self-pay

## 2020-06-15 DIAGNOSIS — G43009 Migraine without aura, not intractable, without status migrainosus: Secondary | ICD-10-CM | POA: Diagnosis not present

## 2020-06-15 DIAGNOSIS — E039 Hypothyroidism, unspecified: Secondary | ICD-10-CM

## 2020-06-15 DIAGNOSIS — Z0000 Encounter for general adult medical examination without abnormal findings: Secondary | ICD-10-CM

## 2020-06-15 NOTE — Progress Notes (Signed)
  [facility] Internal Medicine Residency Telephone Encounter Continuity Care Appointment  HPI:   This telephone encounter was created for Ms. Liston Alba on 06/15/2020 for the following purpose/cc for follow up of chronic conditions. (She missed her in-person appointment.)  She is doing well. Still has migraine headache sometimes but can control for. No new complaint. She reports compliance to her medications. Please refer to problem based charting for further details and assessment and plan.   Past Medical History:  Past Medical History:  Diagnosis Date  . Bipolar 1 disorder ([facility])    Followed by Mental Health.  All psychiatric medications prescribed by Mental Health.  Stable for many years.    . Deep venous thrombosis of leg ([facility]) 2012   completed year of coumadin   . Dyspareunia 03/04/11  . Hx of measles   . Hypothyroidism    Hypothyroidism since 8th grade.  Managed by Dr. Sharl Ma, Endocrinology.  On synthroid.  No prior thyroid surgery/ablation.   . Migraines   . Monilia infection 12/31/10  . Vaginal atrophy 03/04/11      ROS:   Negative except as mentioned in HPI or assessment and plan.   Assessment / Plan / Recommendations:    Please see A&P under problem oriented charting for assessment of the patient's acute and chronic medical conditions.    As always, pt is advised that if symptoms worsen or new symptoms arise, they should go to an urgent care facility or to to ER for further evaluation.   Consent and Medical Decision Making:   Patient discussed with Dr. Cleda Daub  This is a telephone encounter between OTHELIA RIEDERER and Caylie Sandquist on 06/15/2020 for follow-up of chronic conditions; hypothyroidism, migraine headache. The visit was conducted with the patient located at home and [facility] at [facility]. The patient's identity was confirmed using their DOB and current address. The patient has consented to being evaluated through a telephone encounter  and understands the associated risks (an examination cannot be done and the patient may need to come in for an appointment) / benefits (allows the patient to remain at home, decreasing exposure to coronavirus). I personally spent 10 minutes on medical discussion.

## 2020-06-15 NOTE — Assessment & Plan Note (Signed)
 (  Televisit)   she reports compliance to Synthroid. No symptoms. Informed her that her last TSH was a year ago (normal) and we will check it next person visit.  -Continue Synthroid 112 MCG daily

## 2020-06-15 NOTE — Assessment & Plan Note (Addendum)
(  Televisit)  Last migraine attack was 3 days ago but controled with Fioricet. She endorses that she needs Fioricet, less frequent now.   -Continue propranolol and as needed Fioricet

## 2020-06-15 NOTE — Assessment & Plan Note (Signed)
She has received Moderna vaccine. (She does not remember the exact date though).  - I asked patient to bring her vaccination document next visit so we can update her chart with exact date vaccination. -We discussed, that she is due for screening MM and pap smear. She will consider it next visit.

## 2020-06-16 NOTE — Progress Notes (Signed)
[facility] Medicine Clinic Attending  Case discussed with Dr. Masoudi  At the time of the visit.  We reviewed the resident's history and exam and pertinent patient test results.  I agree with the assessment, diagnosis, and plan of care documented in the resident's note.  

## 2020-06-21 ENCOUNTER — Other Ambulatory Visit: Payer: Self-pay | Admitting: Internal Medicine

## 2020-06-21 DIAGNOSIS — J40 Bronchitis, not specified as acute or chronic: Secondary | ICD-10-CM

## 2020-06-21 MED ORDER — ALBUTEROL SULFATE HFA 108 (90 BASE) MCG/ACT IN AERS: 1.0000 | INHALATION_SPRAY | Freq: Four times a day (QID) | RESPIRATORY_TRACT | 2 refills | Status: DC | PRN

## 2020-06-24 ENCOUNTER — Other Ambulatory Visit: Payer: Self-pay | Admitting: Internal Medicine

## 2020-06-24 DIAGNOSIS — G43009 Migraine without aura, not intractable, without status migrainosus: Secondary | ICD-10-CM

## 2020-06-24 NOTE — Telephone Encounter (Signed)
Per previous office visit note, we are prescribing her 20 tablets per month for her migraines. Last filled on 7/27. No concern for pregnancy given prior. She apparently had endorsed improvements in migraines at last visit, but still has not decreased her Fioricet requirement. Currently on propanolol for prophylaxis, consider adjusting this at next visit.

## 2020-06-29 ENCOUNTER — Other Ambulatory Visit: Payer: Self-pay | Admitting: Internal Medicine

## 2020-06-29 NOTE — Telephone Encounter (Signed)
I will refill this medication. Please address at next visit. Was last assessed in 2017 and at that time had some difficulty swallowing. Noted to have tobacco use. Barium swallow was ordered. Patient did not ever get this done, and apparently has not brought this up since that time.

## 2020-07-01 ENCOUNTER — Other Ambulatory Visit: Payer: Self-pay | Admitting: Internal Medicine

## 2020-07-16 ENCOUNTER — Other Ambulatory Visit: Payer: Self-pay

## 2020-07-16 MED ORDER — GABAPENTIN 300 MG PO CAPS
300.0000 mg | ORAL_CAPSULE | Freq: Three times a day (TID) | ORAL | 6 refills | Status: DC
Start: 2020-07-16 — End: 2021-10-12

## 2020-07-16 NOTE — Telephone Encounter (Signed)
Pt states the pharmacy is waiting for a reply since Monday on gabapentin (NEURONTIN) 300 MG capsule. Please call pt back.

## 2020-07-27 ENCOUNTER — Other Ambulatory Visit: Payer: Self-pay | Admitting: Student

## 2020-07-27 DIAGNOSIS — G43009 Migraine without aura, not intractable, without status migrainosus: Secondary | ICD-10-CM

## 2020-08-02 ENCOUNTER — Encounter (HOSPITAL_COMMUNITY): Payer: Self-pay | Admitting: Emergency Medicine

## 2020-08-02 ENCOUNTER — Other Ambulatory Visit: Payer: Self-pay

## 2020-08-02 ENCOUNTER — Ambulatory Visit (HOSPITAL_COMMUNITY)
Admission: EM | Admit: 2020-08-02 | Discharge: 2020-08-02 | Disposition: A | Discharge status: Home / Self Care | Payer: Medicaid Other | Attending: Family Medicine | Admitting: Family Medicine

## 2020-08-02 DIAGNOSIS — G43801 Other migraine, not intractable, with status migrainosus: Secondary | ICD-10-CM

## 2020-08-02 DIAGNOSIS — K121 Other forms of stomatitis: Secondary | ICD-10-CM

## 2020-08-02 MED ORDER — KETOROLAC TROMETHAMINE 30 MG/ML IJ SOLN
30.0000 mg | Freq: Once | INTRAMUSCULAR | Status: AC
  Administered 2020-08-02: 30 mg via INTRAMUSCULAR

## 2020-08-02 MED ORDER — DEXAMETHASONE SODIUM PHOSPHATE 10 MG/ML IJ SOLN
10.0000 mg | Freq: Once | INTRAMUSCULAR | Status: AC
  Administered 2020-08-02: 10 mg via INTRAMUSCULAR

## 2020-08-02 MED ORDER — MAGIC MOUTHWASH W/LIDOCAINE: 5.0000 mL | Freq: Four times a day (QID) | ORAL | 0 refills | Status: DC | PRN

## 2020-08-02 MED ORDER — DEXAMETHASONE SODIUM PHOSPHATE 10 MG/ML IJ SOLN
INTRAMUSCULAR | Status: AC
  Filled 2020-08-02: qty 1

## 2020-08-02 MED ORDER — KETOROLAC TROMETHAMINE 30 MG/ML IJ SOLN
INTRAMUSCULAR | Status: AC
  Filled 2020-08-02: qty 1

## 2020-08-02 NOTE — Discharge Instructions (Signed)
He received an injection of Toradol for pain steroid to help with her migraine  I have also sent in Magic mouthwash with lidocaine for the ulcers in your mouth  Follow-up with primary care if symptoms are persisting  Follow-up with the ER

## 2020-08-02 NOTE — ED Triage Notes (Signed)
History of the same.  Patient reports blisters in mouth for 4 days.  Patient complains of a migraine.  Taking migraine medicine with no relief

## 2020-08-02 NOTE — ED Provider Notes (Signed)
[facility]   [id number] 08/02/20 Arrival Time: 1513  CC: HEADACHE  SUBJECTIVE:  [name] is a 49 y.o. female who complains of headache for the last 2 days. Reports that she took butalbital today with no relief. Also reports blister to the lower gums. Has had these before. Wears dentures. Denies a precipitating event, or recent head trauma. Describes the pain as throbbing and that her whole head is hurting at this point. This is not the worst headache of their life. Patient denies fever, chills, nausea, vomiting, aura, rhinorrhea, watery eyes, chest pain, SOB, abdominal pain, weakness, numbness or tingling, slurred speech.     ROS: As per HPI.  All other pertinent ROS negative.     Past Medical History:  Diagnosis Date  . Bipolar 1 disorder (HCC)    Followed by Mental Health.  All psychiatric medications prescribed by Mental Health.  Stable for many years.    . Deep venous thrombosis of leg (HCC) 2012   completed year of coumadin   . Dyspareunia 03/04/11  . Hx of measles   . Hypothyroidism    Hypothyroidism since 8th grade.  Managed by Dr. Sharl Ma, Endocrinology.  On synthroid.  No prior thyroid surgery/ablation.   . Migraines   . Monilia infection 12/31/10  . Vaginal atrophy 03/04/11   Past Surgical History:  Procedure Laterality Date  . ABDOMINAL HYSTERECTOMY  2000  . APPENDECTOMY    . CESAREAN SECTION  1998  . CHOLECYSTECTOMY    . left elbow    . 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 TONSILLECTOMY    . WISDOM TOOTH EXTRACTION     Allergies  Allergen Reactions  . Codeine Nausea And Vomiting and Rash  . Morphine Shortness Of Breath  . Sulfa Antibiotics Shortness Of Breath  . Sulfonamide Derivatives Shortness Of Breath, Nausea Only and Rash  . Meloxicam Nausea Only  . Morphine And Related Other (See Comments)    Makes me loopy and hallucinates  . Naproxen Nausea Only and Rash   Current Facility-Administered Medications on File Prior to Encounter    Medication Dose Route Frequency Provider Last Rate Last Admin  . betamethasone acetate-betamethasone sodium phosphate (CELESTONE) injection 3 mg  3 mg Intramuscular Once Felecia Shelling, DPM       Current Outpatient Medications on File Prior to Encounter  Medication Sig Dispense Refill  . fluticasone (FLONASE) 50 MCG/ACT nasal spray USE 1 SPRAY IN BOTH NOSTRILS DAILY 16 g 1  . albuterol (VENTOLIN HFA) 108 (90 Base) MCG/ACT inhaler Inhale 1-2 puffs into the lungs every 6 (six) hours as needed for wheezing or shortness of breath. 8 g 2  . ARIPiprazole (ABILIFY) 30 MG tablet Take 30 mg by mouth at bedtime.  2  . buPROPion (WELLBUTRIN XL) 150 MG 24 hr tablet Take 150 mg by mouth daily.   2  . busPIRone (BUSPAR) 15 MG tablet Take 15 mg by mouth 4 (four) times daily.    . butalbital-acetaminophen-caffeine (FIORICET) 50-325-40 MG tablet TAKE 1 TABLET BY MOUTH EVERY 5 TO 7 DAYS AS NEEDED FOR SEVERE MIGRAINE 20 tablet 0  . cholestyramine light (PREVALITE) 4 g packet Take 0.5 packets (2 g total) by mouth daily. With a meal (Patient not taking: Reported on 04/29/2019) 60 packet 0  . clotrimazole (MYCELEX) 10 MG troche Take 1 tablet (10 mg total) by mouth 5 (five) times daily. 70 Troche 0  . cyclobenzaprine (FLEXERIL) 5 MG tablet Take one tablet daily  as needed. 10 tablet 0  . gabapentin (NEURONTIN) 300 MG capsule Take 1 capsule (300 mg total) by mouth 3 (three) times daily. 270 capsule 6  . hydrOXYzine (VISTARIL) 25 MG capsule Take 25 mg by mouth every 8 (eight) hours as needed for anxiety.   2  . levothyroxine (SYNTHROID) 112 MCG tablet Take 1 tablet (112 mcg total) by mouth daily before breakfast. 90 tablet 1  . lidocaine (XYLOCAINE) 5 % ointment Apply 1 application topically as needed. 30 g 0  . meclizine (ANTIVERT) 25 MG tablet TAKE 1 TABLET(25 MG) BY MOUTH TWICE DAILY AS NEEDED FOR DIZZINESS 60 tablet 5  . metoCLOPramide (REGLAN) 10 MG tablet Take 1 tablet (10 mg total) by mouth 3 (three) times daily as  needed for nausea. 10 tablet 0  . nystatin (MYCOSTATIN/NYSTOP) powder Apply topically 4 (four) times daily. 15 g 0  . pantoprazole (PROTONIX) 40 MG tablet TAKE 1 TABLET BY MOUTH TWICE DAILY 30 MINUTES BEFORE BREAKFAST AND DINNER 180 tablet 1  . propranolol (INDERAL) 80 MG tablet Take 1 tablet (80 mg total) by mouth 3 (three) times daily. 270 tablet 1  . rizatriptan (MAXALT) 5 MG tablet TAKE 1 TABLET(5 MG) BY MOUTH 1 TIME AS NEEDED FOR MIGRAINE. MAY REPEAT IN 2 HOURS AS NEEDED (Patient taking differently: Take 5 mg by mouth as needed for migraine. TAKE 1 TABLET(5 MG) BY MOUTH 1 TIME AS NEEDED FOR MIGRAINE. MAY REPEAT IN 2 HOURS AS NEEDED) 30 tablet 0  . simvastatin (ZOCOR) 40 MG tablet TAKE 1 TABLET BY MOUTH EVERY DAY AT 6 PM 90 tablet 3  . traZODone (DESYREL) 100 MG tablet Take 1 tablet (100 mg total) by mouth at bedtime. For sleep 10 tablet 0  . [DISCONTINUED] meclizine (ANTIVERT) 25 MG tablet TAKE 1 TABLET(25 MG) BY MOUTH TWICE DAILY AS NEEDED FOR DIZZINESS 60 tablet 0  . [DISCONTINUED] meclizine (ANTIVERT) 25 MG tablet TAKE 1 TABLET(25 MG) BY MOUTH TWICE DAILY AS NEEDED FOR DIZZINESS 60 tablet 0   Social History   Socioeconomic History  . Marital status: Legally Separated    Spouse name: Not on file  . Number of children: Not on file  . Years of education: Not on file  . Highest education level: Not on file  Occupational History  . Not on file  Tobacco Use  . Smoking status: Current Every Day Smoker    Packs/day: 0.30    Types: Cigarettes  . Smokeless tobacco: Never Used  . Tobacco comment:  trying  to cut back and then out  Vaping Use  . Vaping Use: Never used  Substance and Sexual Activity  . Alcohol use: No    Alcohol/week: 0.0 standard drinks  . Drug use: No  . Sexual activity: Not on file    Comment: hysterectomy  Other Topics Concern  . Not on file  Social History Narrative  . Not on file   Social Determinants of Health   Financial Resource Strain:   . Difficulty of  Paying Living Expenses: Not on file  Food Insecurity:   . Worried About Programme researcher, broadcasting/film/video in the Last Year: Not on file  . Ran Out of Food in the Last Year: Not on file  Transportation Needs:   . Lack of Transportation (Medical): Not on file  . Lack of Transportation (Non-Medical): Not on file  Physical Activity:   . Days of Exercise per Week: Not on file  . Minutes of Exercise per Session: Not on file  Stress:   . Feeling of Stress : Not on file  Social Connections:   . Frequency of Communication with Friends and Family: Not on file  . Frequency of Social Gatherings with Friends and Family: Not on file  . Attends Religious Services: Not on file  . Active Member of Clubs or Organizations: Not on file  . Attends Banker Meetings: Not on file  . Marital Status: Not on file  Intimate Partner Violence:   . Fear of Current or Ex-Partner: Not on file  . Emotionally Abused: Not on file  . Physically Abused: Not on file  . Sexually Abused: Not on file   Family History  Problem Relation Age of Onset  . Hypertension Mother   . Other Father   . Cancer Maternal Aunt     OBJECTIVE:  Vitals:   08/02/20 1713  BP: 124/90  Pulse: 72  Resp: 20  Temp: 98.2 F (36.8 C)  TempSrc: Oral  SpO2: 99%    General appearance: alert; no distress Eyes: PERRLA; EOMI HENT: normocephalic; atraumatic Mouth: white lesion to lower gumline, no drainage, not erythematous Neck: supple with FROM Lungs: clear to auscultation bilaterally Heart: regular rate and rhythm.  Radial pulses 2+ symmetrical bilaterally Extremities: no edema; symmetrical with no gross deformities Skin: warm and dry Neurologic: CN 2-12 grossly intact; finger to nose without difficulty; normal gait; strength and sensation intact bilaterally about the upper and lower extremities; negative pronator drift Psychological: alert and cooperative; normal mood and affect   ASSESSMENT & PLAN:  1. Stomatitis   2. Other  migraine with status migrainosus, not intractable     Meds ordered this encounter  Medications  . ketorolac (TORADOL) 30 MG/ML injection 30 mg  . dexamethasone (DECADRON) injection 10 mg  . magic mouthwash w/lidocaine SOLN    Sig: Take 5 mLs by mouth 4 (four) times daily as needed for mouth pain.    Dispense:  100 mL    Refill:  0    Order Specific Question:   Supervising Provider    Answer:   Merrilee Jansky [[id number]]   Magic mouthwash prescribed Toradol 30mg  IM in office today Decadron 10mg  IM in office today Migraine cocktail given in office Rest and drink plenty of fluids Use OTC medications as needed for symptomatic relief Follow up with PCP if symptoms persists Return or go to the ER if you have any new or worsening symptoms such as fever, chills, nausea, vomiting, chest pain, shortness of breath, cough, vision changes, worsening headache despite treatment, slurred speech, facial asymmetry, weakness in arms or legs.  Reviewed expectations re: course of current medical issues. Questions answered. Outlined signs and symptoms indicating need for more acute intervention. Patient verbalized understanding. After Visit Summary given.   , NP 08/03/20 1259

## 2020-08-24 ENCOUNTER — Other Ambulatory Visit: Payer: Self-pay

## 2020-08-24 DIAGNOSIS — G43009 Migraine without aura, not intractable, without status migrainosus: Secondary | ICD-10-CM

## 2020-08-25 MED ORDER — BUTALBITAL-APAP-CAFFEINE 50-325-40 MG PO TABS: ORAL_TABLET | ORAL | 0 refills | Status: DC

## 2020-09-06 ENCOUNTER — Other Ambulatory Visit: Payer: Self-pay

## 2020-09-06 ENCOUNTER — Encounter (HOSPITAL_COMMUNITY): Payer: Self-pay

## 2020-09-06 ENCOUNTER — Ambulatory Visit (HOSPITAL_COMMUNITY)
Admission: EM | Admit: 2020-09-06 | Discharge: 2020-09-06 | Disposition: A | Discharge status: Home / Self Care | Payer: Medicaid Other | Attending: Emergency Medicine | Admitting: Emergency Medicine

## 2020-09-06 ENCOUNTER — Telehealth: Payer: Self-pay | Admitting: Internal Medicine

## 2020-09-06 DIAGNOSIS — L0291 Cutaneous abscess, unspecified: Secondary | ICD-10-CM

## 2020-09-06 MED ORDER — CEPHALEXIN 500 MG PO CAPS: 500.0000 mg | ORAL_CAPSULE | Freq: Four times a day (QID) | ORAL | 0 refills | Status: DC

## 2020-09-06 NOTE — ED Triage Notes (Signed)
Pt reports having an abscess in the right breast and pain, redness and swelling x 1 week. Pt reports the abscess bursted last night and had a orange drainage. Pt having chills. Denies fever.

## 2020-09-06 NOTE — ED Provider Notes (Signed)
[facility]    CSN: [id number] Arrival date & time: 09/06/20  1350      History   Chief Complaint Chief Complaint  Patient presents with  . Abscess    HPI [name] is a 49 y.o. female.   Patient presents with an abscess on her right breast x1 week.  She states it ruptured last night and has had "orange" drainage from the abscess.  She also reports chills.  She denies fever, cough, chest pain, shortness of breath, abdominal pain, or other symptoms.  Her medical history includes breast cyst, obesity, hypertension, hypothyroidism, DVT, migraines, bipolar 1, vertigo, tobacco abuse.  The history is provided by the patient and medical records.    Past Medical History:  Diagnosis Date  . Bipolar 1 disorder (HCC)    Followed by Mental Health.  All psychiatric medications prescribed by Mental Health.  Stable for many years.    . Deep venous thrombosis of leg (HCC) 2012   completed year of coumadin   . Dyspareunia 03/04/11  . Hx of measles   . Hypothyroidism    Hypothyroidism since 8th grade.  Managed by Dr. Sharl Ma, Endocrinology.  On synthroid.  No prior thyroid surgery/ablation.   . Migraines   . Monilia infection 12/31/10  . Vaginal atrophy 03/04/11    Patient Active Problem List   Diagnosis Date Noted  . Hip pain, chronic, right 10/17/2019  . Right calf pain 08/14/2018  . Bronchitis 03/15/2018  . Obesity 06/24/2016  . Breast cyst 05/27/2016  . Acute midline low back pain 01/13/2016  . 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 Essential hypertension 06/01/2015  . Diarrhea 08/12/2014  . Migraines 08/12/2014  . Vertigo 05/21/2014  . Insomnia 03/11/2014  . Frequent falls 09/02/2013  . Healthcare maintenance 08/16/2013  . Tobacco abuse 08/14/2012  . Menopausal symptoms 03/16/2012  . History of DVT of lower extremity 03/16/2012  . HYPERCHOLESTEROLEMIA, PURE 04/10/2007  . RHINITIS, ALLERGIC NOS 04/10/2007  . GERD 04/10/2007  . Hypothyroidism 01/17/2007  . Bipolar I  disorder, most recent episode depressed (HCC) 01/17/2007    Past Surgical History:  Procedure Laterality Date  . ABDOMINAL HYSTERECTOMY  2000  . APPENDECTOMY    . CESAREAN SECTION  1998  . CHOLECYSTECTOMY    . left elbow    . 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 TONSILLECTOMY    . WISDOM TOOTH EXTRACTION      OB History    Gravida  1   Para  1   Term  1   Preterm      AB      Living  1     SAB      TAB      Ectopic      Multiple      Live Births               Home Medications    Prior to Admission medications   Medication Sig Start Date End Date Taking? Authorizing Provider  fluticasone (FLONASE) 50 MCG/ACT nasal spray USE 1 SPRAY IN BOTH NOSTRILS DAILY 03/19/20   Masoudi, Shawna Orleans, MD  albuterol (VENTOLIN HFA) 108 (90 Base) MCG/ACT inhaler Inhale 1-2 puffs into the lungs every 6 (six) hours as needed for wheezing or shortness of breath. 06/21/20   Ephriam Knuckles, Rylee, MD  ARIPiprazole (ABILIFY) 30 MG tablet Take 30 mg by mouth at bedtime. 05/20/16   [provider]  buPROPion (WELLBUTRIN XL) 150 MG 24 hr tablet  Take 150 mg by mouth daily.  05/21/16   [provider]  busPIRone (BUSPAR) 15 MG tablet Take 15 mg by mouth 4 (four) times daily. 03/28/19   [provider]  butalbital-acetaminophen-caffeine (FIORICET) 50-325-40 MG tablet Take 1 tab every 5 to 7 days as needed for severe migraine 08/25/20   Bloomfield, Carley D, DO  cephALEXin (KEFLEX) 500 MG capsule Take 1 capsule (500 mg total) by mouth 4 (four) times daily. 09/06/20   Mickie Bail, NP  cholestyramine light (PREVALITE) 4 g packet Take 0.5 packets (2 g total) by mouth daily. With a meal Patient not taking: Reported on 04/29/2019 03/01/18   Scherrie Gerlach, MD  clotrimazole [facility]) 10 MG troche Take 1 tablet (10 mg total) by mouth 5 (five) times daily. 11/16/19   Mardella Layman, MD  cyclobenzaprine (FLEXERIL) 5 MG tablet Take one tablet daily as needed. 11/22/19    Masoudi, Shawna Orleans, MD  gabapentin (NEURONTIN) 300 MG capsule Take 1 capsule (300 mg total) by mouth 3 (three) times daily. 07/16/20   Elige Radon, MD  hydrOXYzine (VISTARIL) 25 MG capsule Take 25 mg by mouth every 8 (eight) hours as needed for anxiety.  02/07/17   [provider]  levothyroxine (SYNTHROID) 112 MCG tablet Take 1 tablet (112 mcg total) by mouth daily before breakfast. 03/27/20   Tyson Alias, MD  lidocaine (XYLOCAINE) 5 % ointment Apply 1 application topically as needed. 11/17/19   Theotis Barrio, MD  magic mouthwash w/lidocaine SOLN Take 5 mLs by mouth 4 (four) times daily as needed for mouth pain. 08/02/20   Moshe Cipro, NP  meclizine (ANTIVERT) 25 MG tablet TAKE 1 TABLET(25 MG) BY MOUTH TWICE DAILY AS NEEDED FOR DIZZINESS 06/08/20   Masoudi, Shawna Orleans, MD  metoCLOPramide (REGLAN) 10 MG tablet Take 1 tablet (10 mg total) by mouth 3 (three) times daily as needed for nausea. 10/17/19 10/16/20  Masoudi, Shawna Orleans, MD  nystatin (MYCOSTATIN/NYSTOP) powder Apply topically 4 (four) times daily. 01/01/17   Burns, Tinnie Gens, MD  pantoprazole (PROTONIX) 40 MG tablet TAKE 1 TABLET BY MOUTH TWICE DAILY 30 MINUTES BEFORE BREAKFAST AND DINNER 06/29/20   Remo Lipps, MD  propranolol (INDERAL) 80 MG tablet Take 1 tablet (80 mg total) by mouth 3 (three) times daily. 05/07/20   Bloomfield, Carley D, DO  rizatriptan (MAXALT) 5 MG tablet TAKE 1 TABLET(5 MG) BY MOUTH 1 TIME AS NEEDED FOR MIGRAINE. MAY REPEAT IN 2 HOURS AS NEEDED Patient taking differently: Take 5 mg by mouth as needed for migraine. TAKE 1 TABLET(5 MG) BY MOUTH 1 TIME AS NEEDED FOR MIGRAINE. MAY REPEAT IN 2 HOURS AS NEEDED 03/01/19   Chundi, Vahini, MD  simvastatin (ZOCOR) 40 MG tablet TAKE 1 TABLET BY MOUTH EVERY DAY AT 6 PM 07/02/20   Elige Radon, MD  traZODone (DESYREL) 100 MG tablet Take 1 tablet (100 mg total) by mouth at bedtime. For sleep 06/28/19   Earl Lagos, MD  meclizine (ANTIVERT) 25 MG  tablet TAKE 1 TABLET(25 MG) BY MOUTH TWICE DAILY AS NEEDED FOR DIZZINESS 01/01/20   Masoudi, Shawna Orleans, MD  meclizine (ANTIVERT) 25 MG tablet TAKE 1 TABLET(25 MG) BY MOUTH TWICE DAILY AS NEEDED FOR DIZZINESS 02/04/20   Masoudi, Shawna Orleans, MD    Family History Family History  Problem Relation Age of Onset  . Hypertension Mother   . Other Father   . Cancer Maternal Aunt     Social History Social History   Tobacco Use  . Smoking status: Current Every  Day Smoker    Packs/day: 0.30    Types: Cigarettes  . Smokeless tobacco: Never Used  . Tobacco comment:  trying  to cut back and then out  Vaping Use  . Vaping Use: Never used  Substance Use Topics  . Alcohol use: No    Alcohol/week: 0.0 standard drinks  . Drug use: No     Allergies   Codeine, Morphine, Sulfa antibiotics, Sulfonamide derivatives, Meloxicam, Morphine and related, and Naproxen   Review of Systems Review of Systems  Constitutional: Negative for chills and fever.  HENT: Negative for ear pain and sore throat.   Eyes: Negative for pain and visual disturbance.  Respiratory: Negative for cough and shortness of breath.   Cardiovascular: Negative for chest pain and palpitations.  Gastrointestinal: Negative for abdominal pain and vomiting.  Genitourinary: Negative for dysuria and hematuria.  Musculoskeletal: Negative for arthralgias and back pain.  Skin: Positive for color change and wound.  Neurological: Negative for seizures and syncope.  All other systems reviewed and are negative.    Physical Exam Triage Vital Signs ED Triage Vitals  Enc Vitals Group     BP      Pulse      Resp      Temp      Temp src      SpO2      Weight      Height      Head Circumference      Peak Flow      Pain Score      Pain Loc      Pain Edu?      Excl. in GC?    No data found.  Updated Vital Signs BP 112/80 (BP Location: Left Arm)   Pulse 68   Temp 98.5 F (36.9 C) (Oral)   SpO2 96%   Visual Acuity Right Eye  Distance:   Left Eye Distance:   Bilateral Distance:    Right Eye Near:   Left Eye Near:    Bilateral Near:     Physical Exam Vitals and nursing note reviewed.  Constitutional:      General: She is not in acute distress.    Appearance: She is well-developed. She is not ill-appearing.  HENT:     Head: Normocephalic and atraumatic.     Mouth/Throat:     Mouth: Mucous membranes are moist.  Eyes:     Conjunctiva/sclera: Conjunctivae normal.  Cardiovascular:     Rate and Rhythm: Normal rate and regular rhythm.     Heart sounds: Normal heart sounds.  Pulmonary:     Effort: Pulmonary effort is normal. No respiratory distress.     Breath sounds: Normal breath sounds.  Chest:    Abdominal:     Palpations: Abdomen is soft.     Tenderness: There is no abdominal tenderness. There is no guarding or rebound.  Musculoskeletal:     Cervical back: Neck supple.  Skin:    General: Skin is warm and dry.     Findings: Erythema and lesion present.     Comments: Right lateral breast: 3 cm x 4 cm area of erythema with central lesion, small amount of blood-yellow drainage, no induration.    Neurological:     General: No focal deficit present.     Mental Status: She is alert and oriented to person, place, and time.     Gait: Gait normal.  Psychiatric:        Mood and Affect: Mood normal.  Behavior: Behavior normal.      UC Treatments / Results  Labs (all labs ordered are listed, but only abnormal results are displayed) Labs Reviewed - No data to display  EKG   Radiology No results found.  Procedures Procedures (including critical care time)  Medications Ordered in UC Medications - No data to display  Initial Impression / Assessment and Plan / UC Course  I have reviewed the triage vital signs and the nursing notes.  Pertinent labs & imaging results that were available during my care of the patient were reviewed by me and considered in my medical decision making (see  chart for details).   Skin abscess on right breast.  Treating with Keflex.  Wound care instructions discussed.  Instructed patient to follow-up with her PCP if her symptoms are not improving.  Patient agrees to plan of care.   Final Clinical Impressions(s) / UC Diagnoses   Final diagnoses:  Abscess     Discharge Instructions     Keep the area clean and dry.  Wash it gently twice a day with soap and water.  Apply an antibiotic cream twice a day.  Take the Keflex as directed.  Follow up with your primary care provider if your symptoms are not improving.        ED Prescriptions    Medication Sig Dispense Auth. Provider   cephALEXin (KEFLEX) 500 MG capsule Take 1 capsule (500 mg total) by mouth 4 (four) times daily. 28 capsule Mickie Bailate, Valree Feild H, NP     PDMP not reviewed this encounter.   Mickie Bailate, Brentyn Seehafer H, NP 09/06/20 1510

## 2020-09-06 NOTE — Discharge Instructions (Signed)
Keep the area clean and dry.  Wash it gently twice a day with soap and water.  Apply an antibiotic cream twice a day.  Take the Keflex as directed.  Follow up with your primary care provider if your symptoms are not improving.

## 2020-09-06 NOTE — Telephone Encounter (Signed)
   Reason for call:   I received a call from [name] at 1:25 AM indicating she had an abscess on her breat the bursts and started to have orange oozing and she wants to see if it is normal.   Pertinent Data:   Reports and abscess on her breast which is in size of a quarter that bursts tonight. It now has orange drainage. No fever or chills. No nausea or vomiting. No other complaint.   Assessment / Plan / Recommendations:   Encouraged pt to place a gauze on the area to collect the drainage. Instrcuted to come to ED if developed    and come to our clinic on monday  day for further evaluation.  As always, pt is advised that if symptoms worsen or new symptoms arise, they should go to an urgent care facility or to to ER for further evaluation.   Wendy Pretty, MD   09/06/2020, 1:33 AM

## 2020-09-18 ENCOUNTER — Other Ambulatory Visit: Payer: Self-pay | Admitting: Student in an Organized Health Care Education/Training Program

## 2020-09-18 DIAGNOSIS — E039 Hypothyroidism, unspecified: Secondary | ICD-10-CM

## 2020-09-22 ENCOUNTER — Other Ambulatory Visit: Payer: Self-pay

## 2020-09-22 DIAGNOSIS — E039 Hypothyroidism, unspecified: Secondary | ICD-10-CM

## 2020-09-22 DIAGNOSIS — G43009 Migraine without aura, not intractable, without status migrainosus: Secondary | ICD-10-CM

## 2020-09-22 NOTE — Telephone Encounter (Signed)
butalbital-acetaminophen-caffeine (FIORICET) 50-325-40 MG tablet,   levothyroxine (SYNTHROID) 112 MCG tablet, refill request @  [id number] - [place], [place] - [address] AT [facility] OF [address] Phone:  [phone]  Fax:  [phone]

## 2020-09-23 MED ORDER — LEVOTHYROXINE SODIUM 112 MCG PO TABS: 112.0000 ug | ORAL_TABLET | Freq: Every day | ORAL | 1 refills | Status: DC

## 2020-09-23 MED ORDER — BUTALBITAL-APAP-CAFFEINE 50-325-40 MG PO TABS: ORAL_TABLET | ORAL | 0 refills | Status: DC

## 2020-09-23 NOTE — Addendum Note (Signed)
Addended by: Fredderick Severance on: 09/23/2020 11:27 AM   Modules accepted: Orders

## 2020-09-28 MED ORDER — LEVOTHYROXINE SODIUM 112 MCG PO TABS: 112.0000 ug | ORAL_TABLET | Freq: Every day | ORAL | 1 refills | Status: DC

## 2020-10-07 ENCOUNTER — Encounter: Payer: Self-pay | Admitting: Internal Medicine

## 2020-10-07 ENCOUNTER — Other Ambulatory Visit: Payer: Self-pay

## 2020-10-07 ENCOUNTER — Ambulatory Visit: Payer: Medicaid Other | Admitting: Internal Medicine

## 2020-10-07 VITALS — BP 118/80 | HR 63 | Temp 98.9°F | Ht 65.0 in | Wt 233.9 lb

## 2020-10-07 DIAGNOSIS — I10 Essential (primary) hypertension: Secondary | ICD-10-CM | POA: Diagnosis not present

## 2020-10-07 DIAGNOSIS — N611 Abscess of the breast and nipple: Secondary | ICD-10-CM | POA: Diagnosis present

## 2020-10-07 DIAGNOSIS — E039 Hypothyroidism, unspecified: Secondary | ICD-10-CM | POA: Diagnosis not present

## 2020-10-07 DIAGNOSIS — Z1231 Encounter for screening mammogram for malignant neoplasm of breast: Secondary | ICD-10-CM

## 2020-10-07 MED ORDER — DOXYCYCLINE HYCLATE 100 MG PO TABS: 100.0000 mg | ORAL_TABLET | Freq: Two times a day (BID) | ORAL | 0 refills | Status: AC

## 2020-10-07 NOTE — Progress Notes (Signed)
   CC: breast absess  HPI:  [name] is a 49 y.o. with PMH as below.   Please see A&P for assessment of the patient's acute and chronic medical conditions.   Had swelling and pain to right breast that started a little over a month ago that drained on its own. Went to urgent care one month ago and was prescribed keflex with no improvement in symptoms. She states it again drained yesterday and was bloody appearing. She states this has never happened before although she does have a similar swelling on her buttocks that is healing. She also endorses a lesion in her mouth.  Denies fever, weight loss, night sweats, nausea, vomiting. She has had some chills.   Past Medical History:  Diagnosis Date  . Bipolar 1 disorder (HCC)    Followed by Mental Health.  All psychiatric medications prescribed by Mental Health.  Stable for many years.    . Deep venous thrombosis of leg (HCC) 2012   completed year of coumadin   . Dyspareunia 03/04/11  . Hx of measles   . Hypothyroidism    Hypothyroidism since 8th grade.  Managed by Dr. Sharl Ma, Endocrinology.  On synthroid.  No prior thyroid surgery/ablation.   . Migraines   . Monilia infection 12/31/10  . Vaginal atrophy 03/04/11   Review of Systems:  10 point ROS negative except as noted in HPI  Physical Exam: Constitution: NAD, appears stated age HENT: /AT, MMM, no lesions or inflammation within the mouth Eyes: no icterus or injection  Cardio: tachycardic, regular rhythm, no m/r/g, no LE edema  Respiratory: nonlabored breathing, on room air MSK: moving all extremities, no axillary lymphadenopathy Neuro: normal affect, a&ox3 Skin: 2cm area of warmth with induration, no fluctuance or drainage, scab over wound, very TTP   Vitals:   10/07/20 1513  BP: 118/80  Pulse: 63  Temp: 98.9 F (37.2 C)  TempSrc: Oral  SpO2: 98%  Weight: 233 lb 14.4 oz (106.1 kg)  Height: 5\' 5"  (1.651 m)    Assessment & Plan:   See Encounters Tab for problem  based charting.  Patient discussed with Dr. 

## 2020-10-07 NOTE — Assessment & Plan Note (Signed)
She has been taking her synthroid.   - TSH today

## 2020-10-07 NOTE — Patient Instructions (Signed)
Thank you for allowing [place] to provide your care today. Today we discussed your right breast abscess.   I have ordered the following labs for you:  Basic metabolic panel, hemoglobin a1c, complete blood count.    I will call if any are abnormal.    Today we made the following changes to your medications:   Please START taking  Doxycycline 100 mg - take one tablet every 12 hours for five days   Please apply warm compresses to the area of your breast throughout the day to help improve any more necessary drainage.   Please follow-up in 3-5 days.     I have placed  A referral for a mammogram. Please complete this once your infection has resolved.   Please call the internal medicine center clinic if you have any questions or concerns, we may be able to help and keep you from a long and expensive emergency room wait. Our clinic and after hours phone number is [phone], the best time to call is Monday through Friday 9 am to 4 pm but there is always someone available 24/7 if you have an emergency. If you need medication refills please notify your pharmacy one week in advance and they will send [place] a request.

## 2020-10-07 NOTE — Assessment & Plan Note (Signed)
Had swelling and pain to right breast that started a little over a month ago that drained on its own. Went to urgent care one month ago and was prescribed keflex with no improvement in symptoms. She states it again drained yesterday and was bloody appearing. She states this has never happened before although she does have a similar swelling on her buttocks that is healing. She also endorses a lesion in her mouth  Denies fever, weight loss, night sweats, nausea, vomiting. She has had some chills. 2 cm circular area lateral to right nipple with induration, no fluctuance, wound is closed and no longer draining. No palpable lymph nodes or other breast lumps. She is TTP over lateral breast. Last mammogram in 2017 was requested due to multiple raised bumps over her breasts. She does have other areas of red bumps but no overt folliculitis. She likely had folliculitis that progressed to infection and abscess Gum lesion is not apparent on exam. She was seen in the ER in October for stomatitis which was thought to be secondary to dentures.   - CBC, hemoglobin a1c - doxycycline 100 mg bid five days  - f/u in 3-5 days  - apply warm compresses throughout the day, if pain increases return to clinic  - if symptoms do not improve or will likely need I&D, if symptoms recur consider autoimmune work-up

## 2020-10-08 ENCOUNTER — Telehealth: Payer: Self-pay

## 2020-10-08 LAB — CBC WITH DIFFERENTIAL/PLATELET
Basophils Absolute: 0 10*3/uL (ref 0.0–0.2)
Basos: 0 %
EOS (ABSOLUTE): 0.1 10*3/uL (ref 0.0–0.4)
Eos: 1 %
Hematocrit: 40.9 % (ref 34.0–46.6)
Hemoglobin: 13.8 g/dL (ref 11.1–15.9)
Immature Grans (Abs): 0 10*3/uL (ref 0.0–0.1)
Immature Granulocytes: 0 %
Lymphocytes Absolute: 3.6 10*3/uL — ABNORMAL HIGH (ref 0.7–3.1)
Lymphs: 32 %
MCH: 28 pg (ref 26.6–33.0)
MCHC: 33.7 g/dL (ref 31.5–35.7)
MCV: 83 fL (ref 79–97)
Monocytes Absolute: 0.6 10*3/uL (ref 0.1–0.9)
Monocytes: 5 %
Neutrophils Absolute: 7 10*3/uL (ref 1.4–7.0)
Neutrophils: 62 %
Platelets: 296 10*3/uL (ref 150–450)
RBC: 4.92 x10E6/uL (ref 3.77–5.28)
RDW: 13.5 % (ref 11.7–15.4)
WBC: 11.3 10*3/uL — ABNORMAL HIGH (ref 3.4–10.8)

## 2020-10-08 LAB — BMP8+ANION GAP
Anion Gap: 13 mmol/L (ref 10.0–18.0)
BUN/Creatinine Ratio: 12 (ref 9–23)
BUN: 15 mg/dL (ref 6–24)
CO2: 26 mmol/L (ref 20–29)
Calcium: 9.3 mg/dL (ref 8.7–10.2)
Chloride: 104 mmol/L (ref 96–106)
Creatinine, Ser: 1.3 mg/dL — ABNORMAL HIGH (ref 0.57–1.00)
GFR calc Af Amer: 55 mL/min/{1.73_m2} — ABNORMAL LOW (ref >59)
GFR calc non Af Amer: 48 mL/min/{1.73_m2} — ABNORMAL LOW (ref >59)
Glucose: 74 mg/dL (ref 65–99)
Potassium: 4.8 mmol/L (ref 3.5–5.2)
Sodium: 143 mmol/L (ref 134–144)

## 2020-10-08 LAB — HEMOGLOBIN A1C
Est. average glucose Bld gHb Est-mCnc: 117 mg/dL
Hgb A1c MFr Bld: 5.7 % — ABNORMAL HIGH (ref 4.8–5.6)

## 2020-10-08 LAB — TSH: TSH: 3.1 u[IU]/mL (ref 0.450–4.500)

## 2020-10-08 NOTE — Telephone Encounter (Signed)
Pt would like to know if she should be on steroid med while taking antibiotic. Please call pt back.

## 2020-10-08 NOTE — Telephone Encounter (Signed)
Attempted to contact patient x1 without response

## 2020-10-09 NOTE — Progress Notes (Signed)
Internal Medicine Clinic Attending  Case discussed with Dr. Cleaster Corin  At the time of the visit.  We reviewed the resident's history and exam and pertinent patient test results.  I agree with the assessment, diagnosis, and plan of care documented in the resident's note.  Recurrent breast abscesses sometimes seen in heavy smokers or those with underlying diabetes; development of fistulas are a potential complication.  Coverage for possible MRSA important and I agree with doxycycline.  POC [place] could be helpful to identify abscess if swelling increases w/o spontaneous drainage.

## 2020-10-09 NOTE — Telephone Encounter (Signed)
Discussed patient's concerns by telephone. Advised patient to continue taking her prescribed antibiotic twice daily for five day course and to follow-up with [facility] on 12/13 for evaluation with Dr. Cleaster Corin. Explained to patient that since her condition is likely infectious in nature, we would not want to co-prescribe a steroid. She understands and agrees with this plan.

## 2020-10-12 ENCOUNTER — Other Ambulatory Visit: Payer: Self-pay | Admitting: Internal Medicine

## 2020-10-12 ENCOUNTER — Encounter: Payer: Medicaid Other | Admitting: Internal Medicine

## 2020-10-12 DIAGNOSIS — G43009 Migraine without aura, not intractable, without status migrainosus: Secondary | ICD-10-CM

## 2020-10-12 DIAGNOSIS — I10 Essential (primary) hypertension: Secondary | ICD-10-CM

## 2020-10-12 NOTE — Progress Notes (Deleted)
   CC: ***  HPI:  Ms.Virginie B Matton is a 49 y.o. with PMH as below.   Please see A&P for assessment of the patient's acute and chronic medical conditions.   Past Medical History:  Diagnosis Date  . Bipolar 1 disorder (HCC)    Followed by Mental Health.  All psychiatric medications prescribed by Mental Health.  Stable for many years.    . Deep venous thrombosis of leg (HCC) 2012   completed year of coumadin   . Dyspareunia 03/04/11  . Hx of measles   . Hypothyroidism    Hypothyroidism since 8th grade.  Managed by Dr. Sharl Ma, Endocrinology.  On synthroid.  No prior thyroid surgery/ablation.   . Migraines   . Monilia infection 12/31/10  . Vaginal atrophy 03/04/11   Review of Systems:  *** 10 point ROS negative except as noted in HPI  Physical Exam:   Constitution: NAD, appears stated age HENT: Eyes:  Cardio: RRR, no m/r/g, no LE edema  Respiratory: CTA, no w/r/r Abdominal: NTTP, soft, non-distended MSK: moving all extremities Neuro: normal affect, a&ox3 GU: Skin: c/d/i    There were no vitals filed for this visit. ***  Assessment & Plan:   See Encounters Tab for problem based charting.  Patient {GC/GE:3044014::"discussed with","seen with"} Dr. {NAMES:3044014::"Butcher","Granfortuna","E. Hoffman","Klima","Mullen","Narendra","Raines","Vincent"}

## 2020-10-14 ENCOUNTER — Telehealth: Payer: Self-pay

## 2020-10-14 NOTE — Telephone Encounter (Signed)
Sent 12/14 to [facility], called and spoke to pharmacist, they did receive it and cannot per insurance be filled til the 12/18

## 2020-10-14 NOTE — Telephone Encounter (Signed)
propranolol (INDERAL) 80 MG tablet, REFILL REQUEST @ [id number] - [place], [place] - [address] AT [address] OF [address]. 

## 2020-10-17 ENCOUNTER — Other Ambulatory Visit: Payer: Self-pay | Admitting: Student

## 2020-10-19 ENCOUNTER — Telehealth: Payer: Self-pay

## 2020-10-19 NOTE — Telephone Encounter (Signed)
pantoprazole (PROTONIX) 40 MG tablet, refill request @  [id number] - [place], [place] - [address] AT [facility] OF [address] Phone:  [phone]  Fax:  [phone]

## 2020-10-19 NOTE — Telephone Encounter (Signed)
This was sent in this am by dr Claudette Laws

## 2020-10-26 ENCOUNTER — Other Ambulatory Visit: Payer: Self-pay | Admitting: Internal Medicine

## 2020-10-26 DIAGNOSIS — G43009 Migraine without aura, not intractable, without status migrainosus: Secondary | ICD-10-CM

## 2020-11-02 ENCOUNTER — Telehealth: Payer: Self-pay | Admitting: Internal Medicine

## 2020-11-02 NOTE — Telephone Encounter (Signed)
She had what looked like a breast infection. She was supposed to follow-up 3-5 days after that visit. The mammogram ordered was screening for breast cancer once her infection resolved. If she's still having pain she may need an incision and drainage and needs to come into clinic.

## 2020-11-02 NOTE — Telephone Encounter (Signed)
Pt called to report she had not been sch by the Breast Center.  Pt was seen on 10/07/2020. The order placed is for a Regular Mammogram Screening.  Pt states she is still having Breast pain and the order needs to be changed To Right Ultrasound of the Breast.  Please advise if this order can be corrected

## 2020-11-03 NOTE — Telephone Encounter (Signed)
Called and spoke with the pt.  Explained she was to f/u in 3-5 if no improvement.  She has sch a rtn appt with Dr. Elaina Pattee tomorrow 11/05/2019 @ 2:45pm

## 2020-11-04 ENCOUNTER — Other Ambulatory Visit: Payer: Self-pay

## 2020-11-04 ENCOUNTER — Ambulatory Visit: Payer: Medicaid Other | Admitting: Student

## 2020-11-04 ENCOUNTER — Other Ambulatory Visit: Payer: Self-pay | Admitting: Internal Medicine

## 2020-11-04 VITALS — BP 121/81 | HR 98 | Temp 98.6°F | Wt 233.3 lb

## 2020-11-04 DIAGNOSIS — N611 Abscess of the breast and nipple: Secondary | ICD-10-CM

## 2020-11-04 MED ORDER — DOXYCYCLINE MONOHYDRATE 100 MG PO TABS: 100.0000 mg | ORAL_TABLET | Freq: Two times a day (BID) | ORAL | 0 refills | Status: AC

## 2020-11-04 NOTE — Patient Instructions (Addendum)
It was a pleasure seeing you in clinic. Today we discussed:   Please have a breast ultrasound performed at the breast center to see why you are having a persistent drainage and pain in the breast  I have also ordered 5 days of antibiotics for the abscess. Please follow up in a few weeks if pain or abscess does not improve.   If you have any questions or concerns, please call our clinic at [phone] between 9am-5pm and after hours call [phone] and ask for the internal medicine resident on call. If you feel you are having a medical emergency please call 911.   Thank you, we look forward to helping you remain healthy!  To schedule an appointment for a COVID vaccine or be added to the vaccine wait list: Go to [email or web address]   OR Go to [email or web address]                  OR Call [phone]                                     OR Call [phone] and select Option 2

## 2020-11-07 NOTE — Assessment & Plan Note (Signed)
Patient presents for follow up of pain in the right breast that started 2 months ago. Originally end to urgent care in November 2021 and had a course of keflex without improvement. Followed up at [facility] 1 month ago and started on doxycyline for 5 days. States that this improved the abscess but did fully resolve the issue. Now she feels that abscess is more pain with some spontaneous drainage about 3 days ago. For the past week also noted to have worsening pain over the entire right breast. Denies fever, chills, nausea, vomiting, trauma to the breast or weight loss. On exam she a 1 cm area of erythematous area with minimal induration and no fluctuance, no drainage appreciated on exam. The right breast is diffusely tender but no masses of lymph nodes palatable. While abscess seems to be improving unclear why it has persisted for 2 months. Does not appear that it is draiange in office today. Will start her on anabiotics and refer for breast ultrasound and mammogram to evaluate for abscess.  Plan Doxycyline 100 mg BID for 5 days Right breast US Mammogram Follow up after imaging or sooner if this becomes worse

## 2020-11-07 NOTE — Progress Notes (Signed)
   CC: right breast abscess  HPI:  [name] is a 50 y.o. with previous medical history listed below presents for follow up of right breast abscess and pain. abdominal pain   Past Medical History:  Diagnosis Date  . Bipolar 1 disorder (HCC)    Followed by Mental Health.  All psychiatric medications prescribed by Mental Health.  Stable for many years.    . Deep venous thrombosis of leg (HCC) 2012   completed year of coumadin   . Dyspareunia 03/04/11  . Hx of measles   . Hypothyroidism    Hypothyroidism since 8th grade.  Managed by Dr. Sharl Ma, Endocrinology.  On synthroid.  No prior thyroid surgery/ablation.   . Migraines   . Monilia infection 12/31/10  . Vaginal atrophy 03/04/11   Review of Systems:  Negative as per HPI  Physical Exam:  Vitals:   11/04/20 1439  BP: 121/81  Pulse: 98  Temp: 98.6 F (37 C)  TempSrc: Oral  SpO2: 98%  Weight: 233 lb 4.8 oz (105.8 kg)   Physical Exam Constitutional:      Appearance: Normal appearance.  HENT:     Right Ear: External ear normal.     Left Ear: External ear normal.     Mouth/Throat:     Mouth: Mucous membranes are moist.  Eyes:     Extraocular Movements: Extraocular movements intact.     Pupils: Pupils are equal, round, and reactive to light.  Cardiovascular:     Rate and Rhythm: Normal rate and regular rhythm.     Pulses: Normal pulses.     Heart sounds: Normal heart sounds.  Pulmonary:     Effort: Pulmonary effort is normal.     Breath sounds: Normal breath sounds.  Abdominal:     General: Abdomen is flat. Bowel sounds are normal.     Palpations: Abdomen is soft.  Skin:    Capillary Refill: Capillary refill takes less than 2 seconds.     Comments: 1 cm of erythema of the right outer lower quadrant with minimal induration, no fluctuance, no drainage, 1 other nearby small area of folliculitis  Right breast is tender to palpation throughout, no masses, no axillary lymphadenopathy, no nipple drainage   Neurological:     General: No focal deficit present.     Mental Status: She is alert. Mental status is at baseline.      Assessment & Plan:   See Encounters Tab for problem based charting.  Patient seen with Dr. Heide Spark

## 2020-11-09 ENCOUNTER — Other Ambulatory Visit: Payer: Self-pay

## 2020-11-09 DIAGNOSIS — J40 Bronchitis, not specified as acute or chronic: Secondary | ICD-10-CM

## 2020-11-09 NOTE — Telephone Encounter (Signed)
albuterol (VENTOLIN HFA) 108 (90 Base) MCG/ACT inhaler, REFILL REQUEST @  [facility] #[id number] - [place], [place] - [address] AT [facility] OF [address] Phone:  [phone]  Fax:  [phone]

## 2020-11-09 NOTE — Progress Notes (Signed)
Internal Medicine Clinic Attending  I saw and evaluated the patient.  I personally confirmed the key portions of the history and exam documented by Dr. Liang and I reviewed pertinent patient test results.  The assessment, diagnosis, and plan were formulated together and I agree with the documentation in the resident's note.  

## 2020-11-10 MED ORDER — ALBUTEROL SULFATE HFA 108 (90 BASE) MCG/ACT IN AERS: 1.0000 | INHALATION_SPRAY | Freq: Four times a day (QID) | RESPIRATORY_TRACT | 2 refills | Status: DC | PRN

## 2020-11-11 ENCOUNTER — Ambulatory Visit: Payer: Medicaid Other

## 2020-11-11 ENCOUNTER — Ambulatory Visit
Admission: RE | Admit: 2020-11-11 | Discharge: 2020-11-11 | Disposition: A | Discharge status: Home / Self Care | Payer: Medicaid Other | Source: Ambulatory Visit | Attending: Internal Medicine | Admitting: Internal Medicine

## 2020-11-11 ENCOUNTER — Other Ambulatory Visit: Payer: Self-pay

## 2020-11-11 DIAGNOSIS — N611 Abscess of the breast and nipple: Secondary | ICD-10-CM

## 2020-11-14 ENCOUNTER — Other Ambulatory Visit: Payer: Self-pay | Admitting: Internal Medicine

## 2020-11-14 DIAGNOSIS — G43009 Migraine without aura, not intractable, without status migrainosus: Secondary | ICD-10-CM

## 2020-11-14 MED ORDER — BUTALBITAL-APAP-CAFFEINE 50-325-40 MG PO TABS: ORAL_TABLET | ORAL | 0 refills | Status: DC

## 2020-11-14 NOTE — Progress Notes (Unsigned)
Patient called the oncall pager regarding intractable migraines. She states that she has needed to take her Fioricet every day since her last refill. Her medication was refilled on 10/27/2020 for 20 pills and she states that she ran out today. She admits to taking at least one pill per day and sometimes more than one pill per day. She states that she has diffuse neck pain and bounding head pain behind her right eye with associated sensitivity to light. Otherwise, she denies nay focal neurologic deficits or additional symptoms.   I discussed the risks and benefits of this medication and made her aware of my concern regarding her usage the medication. Therefore, I will give her an additional 5 pills with a plan to follow up in the clinic this week to discuss alternative medication.     Plan: - Give 5 pills of Fioricet today - Follow up in the clinic this week.    Chari Manning, D.O.  Internal Medicine Resident, PGY-2 [facility]  Pager: [phone] 9:34 PM, 11/14/2020

## 2020-11-24 ENCOUNTER — Encounter: Payer: Medicaid Other | Admitting: Student

## 2020-11-29 ENCOUNTER — Other Ambulatory Visit: Payer: Self-pay | Admitting: Internal Medicine

## 2020-11-29 DIAGNOSIS — R42 Dizziness and giddiness: Secondary | ICD-10-CM

## 2020-12-01 ENCOUNTER — Other Ambulatory Visit: Payer: Self-pay

## 2020-12-01 DIAGNOSIS — G43009 Migraine without aura, not intractable, without status migrainosus: Secondary | ICD-10-CM

## 2020-12-01 DIAGNOSIS — R42 Dizziness and giddiness: Secondary | ICD-10-CM

## 2020-12-01 NOTE — Telephone Encounter (Signed)
  butalbital-acetaminophen-caffeine (FIORICET) 50-325-40 MG tablet  meclizine (ANTIVERT) 25 MG tablet, refill request @  [facility] #[id number] - [place], [place] - [address] AT [facility] OF [address] Phone:  [phone]  Fax:  [phone]

## 2020-12-02 MED ORDER — MECLIZINE HCL 25 MG PO TABS: 25.0000 mg | ORAL_TABLET | Freq: Two times a day (BID) | ORAL | 5 refills | Status: DC | PRN

## 2020-12-02 MED ORDER — BUTALBITAL-APAP-CAFFEINE 50-325-40 MG PO TABS: ORAL_TABLET | ORAL | 0 refills | Status: DC

## 2020-12-02 NOTE — Telephone Encounter (Signed)
Patient seems to be requiring this medication more frequently. Will refill one more time, but no more refills until she is seen. Please make appointment with PCP this month to address migraines.

## 2020-12-07 NOTE — Telephone Encounter (Signed)
Pt aware of appt need prior to next refill-states she was just having "a stressful month" dealing with breast abscess, but she is better now.  Pt declined appt with pcp at this time and will call back to schedule.Criss Alvine, Janard Culp Cassady2/7/202211:28 AM

## 2020-12-25 ENCOUNTER — Other Ambulatory Visit: Payer: Self-pay

## 2020-12-25 DIAGNOSIS — G43009 Migraine without aura, not intractable, without status migrainosus: Secondary | ICD-10-CM

## 2020-12-25 MED ORDER — BUTALBITAL-APAP-CAFFEINE 50-325-40 MG PO TABS: ORAL_TABLET | ORAL | 0 refills | Status: DC

## 2020-12-25 NOTE — Telephone Encounter (Signed)
RTC,  Patient is requesting a refill on her Fioricet for her migraines.  RN informed patient that per her last refill request and telephone note on 12/01/20, there is a note from MD that this medication would be refilled one time, but patient will need to be seen for add'l refills.  Patient raises her voice at RN and tells RN "you are making me feel like a drug seeker"!  RN tells patient she is not trying to do that and is simply relaying MD's instructions from last refill request.  Pt states she "has been dealing with a sick mother and a sick sister and she has no time to see the MD.  When she has time, she will make an appointment.  All she needs is a couple of pills and to send the refill request to Dr. [name] because this is her doctor".  Forwarding to PCP. SChaplin, RN,BSN

## 2020-12-25 NOTE — Telephone Encounter (Signed)
Requesting to speak with a nurse about meds. Please call pt back.  

## 2020-12-31 ENCOUNTER — Encounter: Payer: Medicaid Other | Admitting: Student

## 2021-01-06 ENCOUNTER — Ambulatory Visit: Payer: Medicaid Other | Admitting: Student

## 2021-01-06 DIAGNOSIS — G43009 Migraine without aura, not intractable, without status migrainosus: Secondary | ICD-10-CM | POA: Diagnosis present

## 2021-01-06 DIAGNOSIS — N951 Menopausal and female climacteric states: Secondary | ICD-10-CM | POA: Diagnosis not present

## 2021-01-06 DIAGNOSIS — M545 Low back pain, unspecified: Secondary | ICD-10-CM

## 2021-01-06 DIAGNOSIS — M542 Cervicalgia: Secondary | ICD-10-CM | POA: Diagnosis not present

## 2021-01-06 MED ORDER — BUTALBITAL-APAP-CAFFEINE 50-325-40 MG PO TABS: ORAL_TABLET | ORAL | 0 refills | Status: DC

## 2021-01-06 MED ORDER — CYCLOBENZAPRINE HCL 5 MG PO TABS: ORAL_TABLET | ORAL | 0 refills | Status: DC

## 2021-01-06 MED ORDER — RIZATRIPTAN BENZOATE 5 MG PO TABS
ORAL_TABLET | ORAL | 0 refills | Status: DC
Start: 2021-01-06 — End: 2021-03-31

## 2021-01-06 NOTE — Assessment & Plan Note (Addendum)
Patient presents to request refill of her Fioricet. She reports on average over the last 3 months she has experienced 2 migraines per month. These headaches are preceded by flashing lights and originate at the back of her neck and move to behind her right temple. She states she takes a Fioricet at the first sign and her headache improves with it and resting in a dark room. She states her triggers are chocolate, cigarettes, and caffeine. Reports that she largely avoids chocolate however smokes 3 cigarettes daily (cut down from 1 ppd many years ago, previously did not tolerate nicotine patch), and drinks 4 regular cans of coke daily (states, "I can't live without my caffeine). She acknowledges that she needs to cut back on cigarettes and caffeine however has not been able to. When asked about life stressors, she lists having a new puppy, living with her dependent mother, and being responsible for cooking and chores as her main life stressors. She notes that she gets her psychiatric care at [facility] however does not like the therapist she has seen there.  Assessment/Plan: Ms. Mccrae has been prescribed Fioricet since 2020. On chart review, she was trialed on many other therapies, and she states Fioricet is by far the most effective. Had a thorough discussion about the risk for dependence on Fioricet and the risks of the medication, and she agrees that working towards weaning off of the Fioricet is in her best interest. Also discussed possible contribution of medication overuse given her heavy caffeine use. Plan as follows: - re-prescribed rizatriptan which the patient used previously and instructed her to try rizatriptan at the first sign of migraine for abortive therapy - refilled Fioricet 5 pills for 1 month. Advised the patient to first try the rizatriptan and only use Fioricet for retractable migraine. - Discussed with patient possibility of medication contract with PCP - Instructed patient to NOT call the  after-hours pager but instead call during the day for subsequent refills - Had thorough discussion regarding migraine triggers and working towards decreasing use of cigarettes and caffeine. To this end, placed a referral to [facility] (Dr. Monna Fam) because I think the patient would benefit from stress reduction and coping strategies

## 2021-01-06 NOTE — Patient Instructions (Addendum)
[name],   Thank you for your visit to the [facility] today. It was a pleasure meeting you. Today we discussed the following:  1) Migraines - I have refilled your previous prescription for rizatriptan. I would like you to try taking it first when you have a migraine and try to use the Fiorcet only as a last resort. - I refilled your Fiorcet. - I sent a referral to our counselor, Dr. Monna Fam, to work on managing stress, develop coping strategies, work on caffeine and cigarette dependence  2) Hot flashes - Please talk to your psychiatric care provider about trying an SNRI such as venlafaxine which can help with the vasomotor symptoms of menopause  3) Neck strain - Continue heat and stretching - I refilled your flexeril   Schedule a follow-up appointment with your PCP (Dr. Maryla Morrow). Please bring all of your medications with you.   If you have any questions or concerns, please call our clinic at [phone] between 9am-5pm. Outside of these hours, call [phone] and ask for the internal medicine resident on call. If you feel you are having a medical emergency please call 911.

## 2021-01-06 NOTE — Assessment & Plan Note (Signed)
Patient reports her hot flashes are increasingly bothersome and interfering with her daily activities. She had a total hysterectomy at 50 yo and was initially prescribed hormone replacement therapy until a few years ago when her obgyn advised discontinuing it due to risk for cardiovascular events and breast cancer.  Patient reports she has tried wearing layers and adjusting the thermostat but states living with her mother and step father limits her ability to cool the house down. She requests medication to help.  Assessment/Plan: I would consider treatment of the patient's vasomotor symptoms with an SNRI such as venlafaxine, however given her lengthy psychiatric medication list, instructed patient to raise this with her psychiatric care provider.

## 2021-01-06 NOTE — Assessment & Plan Note (Signed)
Patient reports a ~2-week history of R-sided neck pain/tightness. Denies preceding trauma. She has tried applying heat, taking tylenol with minimal relief. Reports she tried a leftover flexeril which eased the discomfort.   On exam, R side of neck with increased tension and tenderness to palpation along inferior aspect of SCM, no overlying warm, swelling, or skin changes  A/P: Presentation most consistent with neck muscle strain.  - encouraged continue applying heat - encouraged stretching and massage - refilled flexeril

## 2021-01-06 NOTE — Progress Notes (Signed)
   CC: migraine medication refill, hot flashes, neck pain  HPI:  [name] is a 50 y.o. woman with history as below who presents to clinic for the above chief complaints. Her last clinic visit was on 11/04/20 with Dr. Elaina Pattee for a breast infection.   To see the details of this patient's management of their acute and chronic problems, please refer to the Assessment & Plan under the Encounters tab.    Past Medical History:  Diagnosis Date  . Bipolar 1 disorder (HCC)    Followed by Mental Health.  All psychiatric medications prescribed by Mental Health.  Stable for many years.    . Deep venous thrombosis of leg (HCC) 2012   completed year of coumadin   . Dyspareunia 03/04/11  . Hx of measles   . Hypothyroidism    Hypothyroidism since 8th grade.  Managed by Dr. Sharl Ma, Endocrinology.  On synthroid.  No prior thyroid surgery/ablation.   . Migraines   . Monilia infection 12/31/10  . Vaginal atrophy 03/04/11   Review of Systems:    Review of Systems  Constitutional: Negative for chills and fever.  Eyes: Negative for blurred vision.  Cardiovascular: Negative for chest pain and palpitations.  Musculoskeletal: Positive for neck pain.  Neurological: Negative for dizziness.    Physical Exam:  Vitals:   01/06/21 1511  BP: (!) 135/93  Pulse: 64  Temp: 98.7 F (37.1 C)  TempSrc: Oral  SpO2: 100%  Weight: 232 lb 9.6 oz (105.5 kg)  Height: 5\' 5"  (1.651 m)   Constitutional: well-appearing woman sitting in chair, in no acute distress HENT: normocephalic atraumatic, mucous membranes moist Eyes: conjunctiva non-erythematous Neck: supple Cardiovascular: regular rate and rhythm, no m/r/g Pulmonary/Chest: normal work of breathing on room air, lungs clear to auscultation bilaterally Abdominal: soft, non-tender, non-distended MSK: normal bulk and tone; R side of neck with increased tension and tenderness to palpation along inferior aspect of SCM, no overlying warm, swelling, or skin  changes Neurological: alert & oriented x 3, normal gait Skin: warm and dry Psych: normal mood and affect    Assessment & Plan:   See Encounters Tab for problem based charting.  Patient discussed with Dr. 

## 2021-01-08 ENCOUNTER — Telehealth: Payer: Self-pay

## 2021-01-08 NOTE — Telephone Encounter (Signed)
Return pt's - stated she talked to Dr Claudette Laws on Wednesady. Stated for the hot flashes, she had talked to her mental health provider who "recommended hormone replacement rather than the anti-depressant the doctor wanted to put me on".

## 2021-01-08 NOTE — Telephone Encounter (Signed)
Pt is requesting a call back about her medication  

## 2021-01-08 NOTE — Telephone Encounter (Signed)
Spoke with the patient and discussed that prescriptions for hormone replacement therapy are more appropriately managed by OBGYN. She states she will research OBGYNs and then call back later for a referral.

## 2021-01-12 ENCOUNTER — Encounter: Payer: Self-pay | Admitting: Internal Medicine

## 2021-01-12 NOTE — Progress Notes (Signed)
Internal Medicine Clinic Attending  Case discussed with Dr. Watson  At the time of the visit.  We reviewed the resident's history and exam and pertinent patient test results.  I agree with the assessment, diagnosis, and plan of care documented in the resident's note.  

## 2021-01-26 ENCOUNTER — Telehealth: Payer: Self-pay

## 2021-01-26 NOTE — Telephone Encounter (Signed)
This needs to be evaluated prior to starting antibiotics. Glad to hear she has an appointment tomorrow.

## 2021-01-26 NOTE — Telephone Encounter (Signed)
Requesting to speak with a nurse about having sore on the left breast. Please call pt back.

## 2021-01-26 NOTE — Telephone Encounter (Signed)
Pt's calling back, asking about abx. Informed pt I have not received a response back from the doctor yet.

## 2021-01-26 NOTE — Telephone Encounter (Signed)
Return pt's call-stated she had large sore or blister on her left breast since last Wed. And it bursted today; drained blood and a liquid. She wants to know if she can get an abx called to the pharmacy today. She has an appt scheduled all ready for tomorrow afternoon. Offer a telehealth appt today but stated she does not have a camera phone.  Stated she will definite keep her appt tomorrow but wants something done today. Thanks

## 2021-01-27 ENCOUNTER — Encounter: Payer: Self-pay | Admitting: Internal Medicine

## 2021-01-27 ENCOUNTER — Ambulatory Visit (INDEPENDENT_AMBULATORY_CARE_PROVIDER_SITE_OTHER): Payer: Medicaid Other | Admitting: Internal Medicine

## 2021-01-27 ENCOUNTER — Other Ambulatory Visit: Payer: Self-pay

## 2021-01-27 VITALS — BP 102/70 | HR 79 | Temp 98.9°F | Ht 65.0 in | Wt 227.5 lb

## 2021-01-27 DIAGNOSIS — L304 Erythema intertrigo: Secondary | ICD-10-CM | POA: Insufficient documentation

## 2021-01-27 DIAGNOSIS — N611 Abscess of the breast and nipple: Secondary | ICD-10-CM | POA: Diagnosis not present

## 2021-01-27 DIAGNOSIS — L0390 Cellulitis, unspecified: Secondary | ICD-10-CM

## 2021-01-27 DIAGNOSIS — B372 Candidiasis of skin and nail: Secondary | ICD-10-CM | POA: Diagnosis not present

## 2021-01-27 HISTORY — DX: Erythema intertrigo: L30.4

## 2021-01-27 MED ORDER — CLOTRIMAZOLE 1 % EX CREA: 1.0000 "application " | TOPICAL_CREAM | Freq: Two times a day (BID) | CUTANEOUS | 0 refills | Status: DC

## 2021-01-27 MED ORDER — DOXYCYCLINE HYCLATE 100 MG PO TABS: 100.0000 mg | ORAL_TABLET | Freq: Two times a day (BID) | ORAL | 0 refills | Status: DC

## 2021-01-27 MED ORDER — "INTERDRY 10""X36"" EX SHEE": 1.0000 "application " | MEDICATED_PATCH | Freq: Two times a day (BID) | CUTANEOUS | 2 refills | Status: DC

## 2021-01-27 NOTE — Progress Notes (Signed)
   CC: breast abscess  HPI:  [name] is a 50 y.o. with PMH as below.   Please see A&P for assessment of the patient's acute and chronic medical conditions.   For the past week she has noticed swelling in her left lateral breast that is similar to prior recurrent skin abscesses. She was having a lot of pain but, it drained purulent fluid yesterday on its own and is feeling much better. She has been having recurrent skin infections around her breasts for about the last two years. No fever, chills, malaise, or nausea On exam there is a small area of cellulitis which has a small amount of purulence, some surrounding erythema. Nothing is expressed with pressure and there is no fluctuance.   She has had a rash beneath both breasts bilaterally that is painful.   Past Medical History:  Diagnosis Date  . Bipolar 1 disorder (HCC)    Followed by Mental Health.  All psychiatric medications prescribed by Mental Health.  Stable for many years.    . Deep venous thrombosis of leg (HCC) 2012   completed year of coumadin   . Dyspareunia 03/04/11  . Hx of measles   . Hypothyroidism    Hypothyroidism since 8th grade.  Managed by Dr. Sharl Ma, Endocrinology.  On synthroid.  No prior thyroid surgery/ablation.   . Migraines   . Monilia infection 12/31/10  . Vaginal atrophy 03/04/11   Review of Systems:   10 point ROS negative except as noted in HPI  Physical Exam: Constitution: NAD, appears stated age Cardio: RRR, no m/r/g, no LE edema  Respiratory: CTA, no w/r/r Abdominal: NTTP, soft, non-distended MSK: moving all extremities Neuro: normal affect, a&ox3 Skin: 1x1 cm area left lateral breast with small amount of purulence and erythema, no fluctuance; erythematous plaque like rash under the breasts bilaterally    Vitals:   01/27/21 1537  BP: 102/70  Pulse: 79  Temp: 98.9 F (37.2 C)  TempSrc: Oral  SpO2: 99%  Weight: 227 lb 8 oz (103.2 kg)  Height: 5\' 5"  (1.651 m)    Assessment &  Plan:   See Encounters Tab for problem based charting.  Patient discussed with Dr. 

## 2021-01-27 NOTE — Assessment & Plan Note (Signed)
  Candidal appearing Intertrigo beneath both breasts bilaterally.   - interdry sheets  - clotrimazole bid  - keep area clean and dry

## 2021-01-27 NOTE — Assessment & Plan Note (Signed)
For the past week she has noticed swelling in her left lateral breast that is similar to prior recurrent skin abscesses. She was having a lot of pain but, it drained purulent fluid yesterday on its own and is feeling much better. She has been having recurrent skin infections around her breasts for about the last two years. No fever, chills, malaise, or nausea On exam there is a small area of cellulitis which has a small amount of purulence, some surrounding erythema. Nothing is expressed with pressure and there is no fluctuance.   - doxycycline 100 mg bid for five days - if no improvement in the next couple of days will order breast US - have not been able to culture prior abscesses as they've drained prior to her coming in.  - check mrsa pcr - can consider preventative care with mupirocin and chlorhexidine bath if positive

## 2021-01-27 NOTE — Patient Instructions (Signed)
Thank you for allowing [place] to provide your care today. Today we discussed your recurrent breast cellulitis and intertrigo fungal infection.     Today we made the following changes to your medications:   Please START taking  Clotrimazole - apply under breasts two times per day After application place interdry sheets under each breast  Doxycycline 100 mg - take 1 tablet every 12 hours for five days.   Please follow-up if symptoms worsen or fail to improve and we will order a breast US to evaluate for abscess. This way we can drain the abscess and test for what type of infection is present.   I am also testing your for MRSA via nasal swab.   Please call the internal medicine center clinic if you have any questions or concerns, we may be able to help and keep you from a long and expensive emergency room wait. Our clinic and after hours phone number is [phone], the best time to call is Monday through Friday 9 am to 4 pm but there is always someone available 24/7 if you have an emergency. If you need medication refills please notify your pharmacy one week in advance and they will send [place] a request.

## 2021-01-28 ENCOUNTER — Institutional Professional Consult (permissible substitution): Payer: Medicaid Other | Admitting: Behavioral Health

## 2021-01-28 ENCOUNTER — Telehealth: Payer: Self-pay | Admitting: Internal Medicine

## 2021-01-28 NOTE — Progress Notes (Signed)
Internal Medicine Clinic Attending  Case discussed with Dr. Seawell  At the time of the visit.  We reviewed the resident's history and exam and pertinent patient test results.  I agree with the assessment, diagnosis, and plan of care documented in the resident's note.  

## 2021-01-28 NOTE — Telephone Encounter (Signed)
Pt requesting a call back about her test results. 

## 2021-01-28 NOTE — Telephone Encounter (Signed)
Pt called / informed labs are still pending and the doctor will call when available - pt stated 'ok".

## 2021-01-28 NOTE — Telephone Encounter (Signed)
They are still pending. Will call when they return.

## 2021-01-29 LAB — MRSA CULTURE: MRSA Screen: NEGATIVE

## 2021-02-02 ENCOUNTER — Telehealth: Payer: Self-pay

## 2021-02-02 NOTE — Telephone Encounter (Signed)
Return pt's call - stated she cannot find interdry sheets; [facility]. I suggested [employer] but she does not have access to the Internet. I will ask Lauren if she has any suggestions.

## 2021-02-02 NOTE — Telephone Encounter (Addendum)
[facility] mentions interdry sheets on their website. Relayed this to patient. Also, suggested she try Medical Supply stores such as [facility]. She was very Adult nurse.

## 2021-02-02 NOTE — Telephone Encounter (Signed)
Pt states she cannot find interdry sheets at the stores. Please call pt back.

## 2021-02-16 ENCOUNTER — Telehealth: Payer: Self-pay | Admitting: Behavioral Health

## 2021-02-16 ENCOUNTER — Institutional Professional Consult (permissible substitution): Payer: Medicaid Other | Admitting: Behavioral Health

## 2021-02-16 ENCOUNTER — Other Ambulatory Visit: Payer: Self-pay

## 2021-02-16 NOTE — Telephone Encounter (Signed)
Contacted Pt on both phone lines on file. Lft msg to alert Pt of call & to r/s at her convenience for future appt.  Dr. Monna Fam

## 2021-03-01 ENCOUNTER — Telehealth: Payer: Self-pay | Admitting: Internal Medicine

## 2021-03-01 DIAGNOSIS — R232 Flushing: Secondary | ICD-10-CM

## 2021-03-01 NOTE — Telephone Encounter (Signed)
Pt requesting a New Referral to see a New OBGYN as her Dr. Has retired and her previous office no longer take Medicaid.  Please advise if an appointment is needed.  The pt states she has had a full hysterectomy  And is having hot flashes and wishes to use a new office that takes her Medicaid.

## 2021-03-02 NOTE — Telephone Encounter (Signed)
I will be happy to place that referral

## 2021-03-04 ENCOUNTER — Other Ambulatory Visit: Payer: Self-pay

## 2021-03-04 DIAGNOSIS — G43009 Migraine without aura, not intractable, without status migrainosus: Secondary | ICD-10-CM

## 2021-03-04 MED ORDER — BUTALBITAL-APAP-CAFFEINE 50-325-40 MG PO TABS: ORAL_TABLET | ORAL | 0 refills | Status: DC

## 2021-03-04 NOTE — Telephone Encounter (Signed)
Pt is requesting her butalbital-acetaminophen-caffeine (FIORICET) 50-325-40 MG tablet sent to  [id number] - [place], [place] - [address] AT [facility] [address] Phone:  [phone]  Fax:  [phone]      ( pt  Is completley out of her medication and is having a headache at the moment )

## 2021-03-23 ENCOUNTER — Institutional Professional Consult (permissible substitution): Payer: Medicaid Other | Admitting: Behavioral Health

## 2021-03-23 ENCOUNTER — Ambulatory Visit: Payer: Medicaid Other | Admitting: Behavioral Health

## 2021-03-23 ENCOUNTER — Other Ambulatory Visit: Payer: Self-pay

## 2021-03-23 DIAGNOSIS — F331 Major depressive disorder, recurrent, moderate: Secondary | ICD-10-CM

## 2021-03-23 DIAGNOSIS — F419 Anxiety disorder, unspecified: Secondary | ICD-10-CM

## 2021-03-24 ENCOUNTER — Other Ambulatory Visit: Payer: Self-pay

## 2021-03-24 DIAGNOSIS — G43009 Migraine without aura, not intractable, without status migrainosus: Secondary | ICD-10-CM

## 2021-03-24 NOTE — BH Specialist Note (Signed)
Integrated Behavioral Health via Telemedicine Visit  03/24/2021 JILLENE WEHRENBERG [id number]  Number of Integrated Behavioral Health visits: 1/6 Session Start time: 9:00am  Session End time: 9:45am Total time: 45   Referring Provider: Dr. Albertha Ghee, MD Patient/Family location: Pt in her room in private; lives w/Mother & StepFather [facility] Provider location: [facility] Office All persons participating in visit: Pt & Clinician Types of Service: Individual psychotherapy  I connected with Liston Alba and/or Oneida Alar self via  Telephone or Engineer, civil (consulting)  (Video is Surveyor, mining) and verified that I am speaking with the correct person using two identifiers. Discussed confidentiality: Yes   I discussed the limitations of telemedicine and the availability of in person appointments.  Discussed there is a possibility of technology failure and discussed alternative modes of communication if that failure occurs.  I discussed that engaging in this telemedicine visit, they consent to the provision of behavioral healthcare and the services will be billed under their insurance.  Patient and/or legal guardian expressed understanding and consented to Telemedicine visit: Yes   Presenting Concerns: Patient and/or family reports the following symptoms/concerns: elevated anxiety due to inc'g caregiver responsibilities & lacking support from older Str Duration of problem: months; Severity of problem: moderate  Patient and/or Family's Strengths/Protective Factors: Social and Emotional competence, Concrete supports in place (healthy food, safe environments, etc.) and Sense of purpose  Goals Addressed: Patient will: 1.  Reduce symptoms of: anxiety, depression and stress  2.  Increase knowledge and/or ability of: coping skills, healthy habits and stress reduction  3.  Demonstrate ability to: Increase healthy adjustment to current life circumstances  Progress  towards Goals: Estb'd today; Pt will secure future sessions & explore caregiver support grps in the Community  Interventions: Interventions utilized:  Copywriter, advertising, Behavioral Activation and Supportive Counseling Standardized Assessments completed: screeners prn  Patient and/or Family Response: Pt receptive to call providing adequate FHx. Pt requests future sessions for check-in.   Assessment: Patient currently experiencing elevated anx w/some related depressive mood due to caregiving duties for Mother & StepFather. Mother has exp'd multiple stroke events & StepFather seems to be moving into some dementia per Pt report. Pt is responsible for their care & tasks in the home. Pt recently acquired a new puppy named 'Buddy'  Patient may benefit from cont'd check-in appt & Caregiver Support Grps-will explore along w/Clinician.  Plan: 1. Follow up with behavioral health clinician on : 2-3 wks for 30 min telehealth check-in 2. Behavioral recommendations: Explore options for support grps, Journal btwn sessions 3. Referral(s): Integrated [employer] (In Clinic)  I discussed the assessment and treatment plan with the patient and/or parent/guardian. They were provided an opportunity to ask questions and all were answered. They agreed with the plan and demonstrated an understanding of the instructions.   They were advised to call back or seek an in-person evaluation if the symptoms worsen or if the condition fails to improve as anticipated.  Deneise Lever, LMFT

## 2021-03-24 NOTE — Telephone Encounter (Signed)
butalbital-acetaminophen-caffeine (FIORICET) 50-325-40 MG tablet, REFILL REQUEST @  [id number] - [place], [place] - [address] AT [facility] Phone:  [phone]  Fax:  [phone]      

## 2021-03-25 NOTE — Telephone Encounter (Signed)
My recommendation is to have patient scheduled for follow-up of her migraines.  On review of her medication history she has been using 20 tablets of Fiorcet monthly in 2021. Recently reduced to 5 tablets per month, I refilled 3 weeks ago and this request is too soon. Patient has reported episodic migraines, but at risk for chronification from frequent butabital in Fioricet. Patient has Rizatriptan also for abortive therapy and according to notes she is not experiencing frequent migraines which would require this amount of medication. Further prophylactic medications , changing patients triptan , and referral to headache clinic can be explored at patients visit.

## 2021-03-25 NOTE — Telephone Encounter (Signed)
Pt states she need this med by today, please call pt back.

## 2021-03-26 NOTE — Telephone Encounter (Signed)
Spoke with patient Informed her refill request had been denied and request was too soon.  Pt states she has "been out" of this medication and feels "its ridiculous I have to keep calling for this refill.  When asked how she was taking-states she takes "as needed" CMA attempted to explain current sig to take one tab every 5 to 7 days, but pt became upset and said "just forget it" and ended call.

## 2021-03-29 ENCOUNTER — Encounter: Payer: Self-pay | Admitting: *Deleted

## 2021-03-30 ENCOUNTER — Other Ambulatory Visit: Payer: Self-pay | Admitting: Internal Medicine

## 2021-03-30 DIAGNOSIS — E039 Hypothyroidism, unspecified: Secondary | ICD-10-CM

## 2021-03-31 ENCOUNTER — Other Ambulatory Visit: Payer: Self-pay | Admitting: Internal Medicine

## 2021-03-31 ENCOUNTER — Telehealth: Payer: Self-pay

## 2021-03-31 DIAGNOSIS — G43009 Migraine without aura, not intractable, without status migrainosus: Secondary | ICD-10-CM

## 2021-03-31 DIAGNOSIS — E039 Hypothyroidism, unspecified: Secondary | ICD-10-CM

## 2021-03-31 MED ORDER — LEVOTHYROXINE SODIUM 112 MCG PO TABS: 112.0000 ug | ORAL_TABLET | Freq: Every day | ORAL | 1 refills | Status: DC

## 2021-03-31 MED ORDER — RIZATRIPTAN BENZOATE 5 MG PO TABS: ORAL_TABLET | ORAL | 0 refills | Status: DC

## 2021-03-31 NOTE — Telephone Encounter (Signed)
Received telephone call from patient who is requesting a refill for :  1-Levothyroxine 2-Rizatriptan (this was last refilled on 01/06/21, however there are quantity limits and patient states she was only allowed to get 12 tablets.  She will need a new RX.  If approved, please write for #12 w/ refills d/t insurance quantity limits).  Also, please see Dr. Mallie Mussel last note regarding the Fiorcet and advise if you would like to have patient scheduled for f/u and possible referral to headache clinic.  Thank you, SChaplin, RN,BSN

## 2021-03-31 NOTE — Progress Notes (Signed)
Patient called emergency pager for refills of her prescriptions. I refilled these medications as requested.

## 2021-04-01 MED ORDER — BUTALBITAL-APAP-CAFFEINE 50-325-40 MG PO TABS: ORAL_TABLET | ORAL | 0 refills | Status: DC

## 2021-04-01 MED ORDER — RIZATRIPTAN BENZOATE 5 MG PO TABS: ORAL_TABLET | ORAL | 0 refills | Status: DC

## 2021-04-01 MED ORDER — LEVOTHYROXINE SODIUM 112 MCG PO TABS: 112.0000 ug | ORAL_TABLET | Freq: Every day | ORAL | 1 refills | Status: DC

## 2021-04-05 ENCOUNTER — Encounter: Payer: Self-pay | Admitting: Family Medicine

## 2021-04-05 ENCOUNTER — Ambulatory Visit: Payer: Medicaid Other | Admitting: Family Medicine

## 2021-04-05 ENCOUNTER — Other Ambulatory Visit: Payer: Self-pay

## 2021-04-05 VITALS — BP 114/77 | HR 75 | Wt 223.8 lb

## 2021-04-05 DIAGNOSIS — E039 Hypothyroidism, unspecified: Secondary | ICD-10-CM | POA: Diagnosis not present

## 2021-04-05 DIAGNOSIS — R232 Flushing: Secondary | ICD-10-CM

## 2021-04-05 MED ORDER — ESTROGENS CONJUGATED 0.3 MG PO TABS: 0.3000 mg | ORAL_TABLET | Freq: Every day | ORAL | 11 refills | Status: DC

## 2021-04-05 NOTE — Progress Notes (Signed)
   NEW GYNECOLOGY PROBLEM  VISIT ENCOUNTER NOTE  Subjective:   [name] is a 50 y.o. G6P1001 female here for a a problem GYN visit.  Current complaints:   TAH in 2000 Reports Premarin right after and it was stopped in 2014 due to concerns for it causing cancer. Patient reports improved sx while on premarin.   Sx currently multiple times per day hot flashes, night sweats, irritability. Minimal vaginal dryness.   Denies abnormal vaginal bleeding, discharge, pelvic pain, problems with intercourse or other gynecologic concerns.    Gynecologic History No LMP recorded. Patient has had a hysterectomy. Contraception: status post hysterectomy  Health Maintenance Due  Topic Date Due  . Pneumococcal Vaccine 56-76 Years old (1 of 4 - PCV13) Never done  . COVID-19 Vaccine (1) Never done  . Hepatitis C Screening  Never done  . PAP SMEAR-Modifier  10/31/2016  . Zoster Vaccines- Shingrix (1 of 2) Never done  . COLONOSCOPY (Pts 45-39yrs Insurance coverage will need to be confirmed)  10/17/2019     The following portions of the patient's history were reviewed and updated as appropriate: allergies, current medications, past family history, past medical history, past social history, past surgical history and problem list.  Review of Systems Pertinent items are noted in HPI.   Objective:  BP 114/77   Pulse 75   Wt 223 lb 12.8 oz (101.5 kg)   BMI 37.24 kg/m  Gen: well appearing, NAD HEENT: no scleral icterus CV: RR Lung: Normal WOB Ext: warm well perfused  PELVIC: not indicated   Assessment and Plan:   1. Vasomotor flushing Reviewed starting at lowest dose and then increasing as needed Encouraged her to send mychart in 2-3 weeks if  - estrogens, conjugated, (PREMARIN) 0.3 MG tablet; Take 1 tablet (0.3 mg total) by mouth daily. Take daily for 21 days then do not take for 7 days.  Dispense: 30 tablet; Refill: 11  2. Hypothyroidism, unspecified type - TSH   Please refer to  After Visit Summary for other counseling recommendations.   Return in about 3 months (around 07/06/2021) for Checkin regarding starting premarin.  Federico Flake, MD, MPH, ABFM Attending Physician Faculty Practice- [facility]

## 2021-04-06 ENCOUNTER — Telehealth: Payer: Self-pay

## 2021-04-06 LAB — TSH: TSH: 1.85 u[IU]/mL (ref 0.450–4.500)

## 2021-04-06 NOTE — Telephone Encounter (Addendum)
-----   Message from Federico Flake, MD sent at 04/06/2021  9:24 AM EDT ----- TSH is will WNL normal. Patient should continue routine follow up with PCP  Left message stating that her labs that she had yesterday came back normal and to continue f/u with her PCP.  If she has any questions to please give the office a call.   Leonette Nutting  04/06/21

## 2021-04-09 ENCOUNTER — Other Ambulatory Visit: Payer: Self-pay | Admitting: *Deleted

## 2021-04-09 DIAGNOSIS — K219 Gastro-esophageal reflux disease without esophagitis: Secondary | ICD-10-CM

## 2021-04-09 MED ORDER — PANTOPRAZOLE SODIUM 40 MG PO TBEC
DELAYED_RELEASE_TABLET | ORAL | 0 refills | Status: DC
Start: 2021-04-09 — End: 2021-07-08

## 2021-04-09 NOTE — Assessment & Plan Note (Signed)
Refill for protonix 40mg  BID sent. Recommend ongoing need for BID dosing be addressed at time of follow up with her primary doctor.

## 2021-04-13 ENCOUNTER — Ambulatory Visit: Payer: Medicaid Other | Admitting: Behavioral Health

## 2021-04-13 ENCOUNTER — Other Ambulatory Visit: Payer: Self-pay

## 2021-04-13 DIAGNOSIS — F419 Anxiety disorder, unspecified: Secondary | ICD-10-CM

## 2021-04-13 NOTE — BH Specialist Note (Signed)
Integrated Behavioral Health via Telemedicine Visit  04/13/2021 [name] [id number]  Number of Integrated Behavioral Health visits: 2/6 Session Start time: 11:30am  Session End time: 12:00pm Total time: 30  Referring Provider: Dr. Albertha Ghee, MD Patient/Family location: Pt in private @ home [facility] Provider location: [facility] Office All persons participating in visit: Pt & Clinician Types of Service: Individual psychotherapy  I connected with [name] and/or [name]  self  via  Telephone or Engineer, civil (consulting)  (Video is Surveyor, mining) and verified that I am speaking with the correct person using two identifiers. Discussed confidentiality: Yes   I discussed the limitations of telemedicine and the availability of in person appointments.  Discussed there is a possibility of technology failure and discussed alternative modes of communication if that failure occurs.  I discussed that engaging in this telemedicine visit, they consent to the provision of behavioral healthcare and the services will be billed under their insurance.  Patient and/or legal guardian expressed understanding and consented to Telemedicine visit: Yes   Presenting Concerns: Patient and/or family reports the following symptoms/concerns: Reduced Sx as expressed last visit; Pt secured GYNE appt & has medication to address hot flashes that became unbearable; her Premarin was initiated on 04/05/21. Pt sleep has improved greatly & she is no longer using her Trazadone bc she has a good sleep-wake routine (10pm-9am). Pt is using her [email or web address] app & wakes feeling restored.Pt is compliant w/her psychopharmacologicals & has Therapist, sports through [employer]. Pt migraines are better-the Maxalt is helping. Duration of problem: months; Severity of problem: moderate tending mild  Patient and/or Family's Strengths/Protective Factors: Social connections, Social and Emotional competence, Concrete  supports in place (healthy food, safe environments, etc.), Sense of purpose, and Physical Health (exercise, healthy diet, medication compliance, etc.)  Goals Addressed: Patient will:  Reduce symptoms of: anxiety, depression, and stress, along w/Caregiving 'burden'  Increase knowledge and/or ability of: coping skills, healthy habits, self-management skills, stress reduction, and caregiver anti-burnout tools, self-care practices    Demonstrate ability to: Increase healthy adjustment to current life circumstances  Progress towards Goals: Ongoing for Pt - Pt sts psychotherapy has been helpful & she will d/c for now knowing Clinician is avail prn  Interventions: Interventions utilized:  Behavioral Activation, Medication Monitoring, Supportive Counseling, Sleep Hygiene, and Psychoeducation and/or Health Education Standardized Assessments completed:  screeners prn  Patient and/or Family Response: Pt receptive to call, even calling to be ready for appt today. Pt is doing well for now & declined future scheduling.  Assessment: Patient currently experiencing reduction in her migraines & inc'd assistance w/her Parents caregiving needs.   Patient may benefit from prn sessions per Pt agreement   Plan: Follow up with behavioral health clinician on : None @ this time Behavioral recommendations: None @ this time Referral(s):  None @ this time  I discussed the assessment and treatment plan with the patient and/or parent/guardian. They were provided an opportunity to ask questions and all were answered. They agreed with the plan and demonstrated an understanding of the instructions.   They were advised to call back or seek an in-person evaluation if the symptoms worsen or if the condition fails to improve as anticipated.  Deneise Lever, LMFT

## 2021-04-22 ENCOUNTER — Other Ambulatory Visit: Payer: Self-pay | Admitting: Internal Medicine

## 2021-04-22 ENCOUNTER — Encounter: Payer: Medicaid Other | Admitting: Internal Medicine

## 2021-04-22 DIAGNOSIS — G43009 Migraine without aura, not intractable, without status migrainosus: Secondary | ICD-10-CM

## 2021-04-22 DIAGNOSIS — I10 Essential (primary) hypertension: Secondary | ICD-10-CM

## 2021-04-22 MED ORDER — RIZATRIPTAN BENZOATE 5 MG PO TABS: ORAL_TABLET | ORAL | 0 refills | Status: DC

## 2021-04-22 MED ORDER — PROPRANOLOL HCL 80 MG PO TABS: ORAL_TABLET | ORAL | 1 refills | Status: DC

## 2021-04-22 NOTE — Progress Notes (Signed)
   Reason for call:  I received a call from Ms. Liston Alba at 6:30 PM indicating that she needs medication refills. Specifically, she states that she needs a refill of her propranolol and rizatriptan.    Pertinent Data:  Propranolol 80 mg was las refilled on 10/13/2020. She was given a 90 day supply with 1 refill.  Rizatriptan 5 mg was last prescribed on 04/01/2021. She was given a months supply. This prescription is not ready to be refill as she just had it prescribed on the 2nd.    Assessment / Plan / Recommendations:  Refill propranolol 80 mg TID Will refill her rizatriptan but instructed her to follow up with the clinic within 1 week to discuss changes to this medications as she ran out early. She may benefit from a referral to the neurology/headache clinic. As always, pt is advised that if symptoms worsen or new symptoms arise, they should go to an urgent care facility or to to ER for further evaluation.   Chari Manning, D.O.  Internal Medicine Resident, PGY-2 [facility]  Pager: [phone] 6:52 PM, 04/22/2021   On call/After Hours (After 5p/weekends / holidays):  First Contact Pager: [phone]  Second Contact Pager: [phone]

## 2021-04-26 ENCOUNTER — Telehealth: Payer: Self-pay | Admitting: *Deleted

## 2021-04-26 ENCOUNTER — Other Ambulatory Visit: Payer: Self-pay | Admitting: Internal Medicine

## 2021-04-26 DIAGNOSIS — G43009 Migraine without aura, not intractable, without status migrainosus: Secondary | ICD-10-CM

## 2021-04-26 NOTE — Assessment & Plan Note (Signed)
I received a prior auth request from her pharmacy as she is requesting more rizatriptan than the 12 pills/ month.  If she is having this many migraines per month, she may benefit for alternative treatments that could be provided through the neurology office.  Will have the triage pool call and relay this to her and explain that our office is happy to continue prescribing the rizatriptan #12/month but recommend referral to neurology if needing more than that.

## 2021-04-26 NOTE — Telephone Encounter (Signed)
-----   Message from Elige Radon, MD sent at 04/26/2021 12:05 PM EDT ----- I received a note from her pharmacy regarding the need for a prior authorization for rizatriptan since she is requesting more than the amount covered of 12 pills/month. If she is needing more than this amount, then I would recommend a referral to neurology.   Please call and let her know this. I am happy to place that referral to neurology if she is agreeable. Otherwise, if she would like to stick with the 12 pills/ month, we can continue to fill that as well.

## 2021-04-26 NOTE — Telephone Encounter (Signed)
Relayed info below to patient. States she's not asking for more than 12 pills. Explained that if she is asking for a early refill Provider would put in a referral to Neurology. Patient then hung up.

## 2021-05-05 ENCOUNTER — Telehealth: Payer: Self-pay | Admitting: Family Medicine

## 2021-05-05 DIAGNOSIS — N951 Menopausal and female climacteric states: Secondary | ICD-10-CM

## 2021-05-05 DIAGNOSIS — Z90710 Acquired absence of both cervix and uterus: Secondary | ICD-10-CM

## 2021-05-05 NOTE — Telephone Encounter (Signed)
Patient said her Hormone Levels need to be increased.

## 2021-05-06 NOTE — Telephone Encounter (Signed)
Called pt; VM left stating I am returning call. Requested pt call back.   Pt left VM on nurse line stating that her symptoms have not improved following initiation of hormone therapy. Would like this increased. States this was the plan if there was no improvement.

## 2021-05-06 NOTE — Telephone Encounter (Signed)
Reviewed with Alvester Morin MD who states she will increase medication. Called pt to notify her to expect new prescription at pharmacy.

## 2021-05-10 MED ORDER — ESTROGENS CONJUGATED 0.45 MG PO TABS: 0.4500 mg | ORAL_TABLET | Freq: Every day | ORAL | 3 refills | Status: DC

## 2021-05-10 NOTE — Addendum Note (Signed)
Addended by: Geanie Berlin on: 05/10/2021 09:29 AM   Modules accepted: Orders

## 2021-05-10 NOTE — Telephone Encounter (Signed)
Increased dose sent to pharmacy

## 2021-05-17 ENCOUNTER — Telehealth: Payer: Self-pay | Admitting: Family Medicine

## 2021-05-17 NOTE — Telephone Encounter (Signed)
Left messages on pt's home and mobile numbers. Pt to return call to office to discuss problem and Rx Premarin.   Judeth Cornfield, RN

## 2021-05-17 NOTE — Telephone Encounter (Signed)
Patient called saying she is having a lot of issues with her Hot Flashes

## 2021-05-17 NOTE — Telephone Encounter (Signed)
Spoke with pt. Pt states having increased hot flashes and night sweats for last 3 weeks. Had increase in Rx Premarin on 7/11 and was directed to take daily for 21 days and then a week break.  Pt states did start the new dosage of Rx on 7/11 and is taking daily, but does not see a difference in vasomotor symptoms.  Pt states had total hysterectomy with ovaries removed 20+ years ago and already went through menopause. Pt states cannot try any herbal or home remedies due to other Rx meds at this time.   Pt has appt on 8/25 with Dr Alvester Morin as follow up. Advised pt to continue to take regimen of Premarin Rx prescribed on 7/11 and if symptoms do not improve, then to call back to office for sooner appt. Pt verbalized understanding and agreeable to plan of care.   Judeth Cornfield, RN

## 2021-05-29 ENCOUNTER — Telehealth: Payer: Self-pay | Admitting: Student

## 2021-05-29 DIAGNOSIS — R42 Dizziness and giddiness: Secondary | ICD-10-CM

## 2021-05-31 NOTE — Telephone Encounter (Signed)
Refill Request- Pt calling back to f/u with her Refill Request  ARIPiprazole ARIPiprazole (ABILIFY) 30 MG tablet  Take 30 mg by mouth at bedtime., Starting Fri 05/20/2016, Historical Med  Refills: 2 ordered  Pharmacy: [facility] #[id number] - [place], [place] - [address] AT [facility] OF [address] (Ph: [phone])

## 2021-06-01 NOTE — Telephone Encounter (Signed)
Pt requesting meclizine (ANTIVERT) 25 MG tablet to be filled by today. Pt would like a call back.

## 2021-06-01 NOTE — Telephone Encounter (Signed)
Spoke with the pt to clarify which medication she needed refilled.  Pt states she has already gotten the following medication refilled:  ARIPiprazole ARIPiprazole (ABILIFY) 30 MG tablet      Take 30 mg by mouth at bedtime., Starting Fri 05/20/2016, Historical Med  Refills: 2 ordered  Pharmacy: [facility] #[id number] - [place], [place] - [address] AT [facility] OF [address] (Ph: [phone])     The patient is only needing the following medication refilled:   meclizine meclizine (ANTIVERT) 25 MG tablet  Take 1 tablet (25 mg total) by mouth 2 (two) times daily as needed for dizziness., Starting Wed 12/02/2020, Normal  Dispense: 60 tablet  Refills: 5 ordered  Pharmacy: [facility] #[id number] - [place], [place] - [address] AT [facility] OF [address] (Ph: [phone])

## 2021-06-01 NOTE — Telephone Encounter (Signed)
Patient has been on this medication since 2015 for dizziness. This problem was last assessed in 2019 though at that time patient noted that the dizziness was most appreciable when going from the seated to standing position and meclizine relieves her symptoms.   We will renew her medication at this time. But during next visit will discuss whether PT versus medication changes could help with her symptoms.

## 2021-06-01 NOTE — Telephone Encounter (Signed)
RTC, pt notified that meclizine RX was approved w/ Dr. Celene Skeen instructions below.   SChaplin, RN,BSN

## 2021-06-02 ENCOUNTER — Other Ambulatory Visit: Payer: Self-pay

## 2021-06-02 DIAGNOSIS — R42 Dizziness and giddiness: Secondary | ICD-10-CM

## 2021-06-02 MED ORDER — MECLIZINE HCL 25 MG PO TABS: ORAL_TABLET | ORAL | 5 refills | Status: DC

## 2021-06-02 NOTE — Telephone Encounter (Signed)
Requesting another doctor to write the Rx for  meclizine (ANTIVERT) 25 MG tablet. Please call pt back.

## 2021-06-15 ENCOUNTER — Other Ambulatory Visit: Payer: Self-pay | Admitting: *Deleted

## 2021-06-15 NOTE — Telephone Encounter (Signed)
Last visit 01/27/21 No future visits scheduled

## 2021-06-16 ENCOUNTER — Telehealth: Payer: Self-pay

## 2021-06-16 MED ORDER — SIMVASTATIN 40 MG PO TABS: ORAL_TABLET | ORAL | 3 refills | Status: DC

## 2021-06-16 NOTE — Telephone Encounter (Signed)
Pt called and informed that levothyroxine RX written on 04/01/21 was a 90 day RX with 1 refill and to call her pharmacy for refill. SChaplin, RN,BSN

## 2021-06-16 NOTE — Telephone Encounter (Signed)
Pt is requesting her levothyroxine (SYNTHROID) 112 MCG tablet sent to [facility] #[id number] - [place], [place] - [address] AT [facility] OF [address] Phone:  [phone]  Fax:  [phone]

## 2021-06-24 ENCOUNTER — Ambulatory Visit: Payer: Medicaid Other | Admitting: Family Medicine

## 2021-07-08 ENCOUNTER — Other Ambulatory Visit: Payer: Self-pay

## 2021-07-08 DIAGNOSIS — G43009 Migraine without aura, not intractable, without status migrainosus: Secondary | ICD-10-CM

## 2021-07-08 MED ORDER — RIZATRIPTAN BENZOATE 5 MG PO TABS: ORAL_TABLET | ORAL | 0 refills | Status: DC

## 2021-07-08 MED ORDER — PANTOPRAZOLE SODIUM 40 MG PO TBEC: DELAYED_RELEASE_TABLET | ORAL | 0 refills | Status: DC

## 2021-07-08 NOTE — Telephone Encounter (Signed)
Pt is requesting herpantoprazole (PROTONIX) 40 MG tablet , rizatriptan (MAXALT) 5 MG tablet sent to  [facility] #[id number] - [place], [place] - [address] AT [facility] OF [address] Phone:  [phone]  Fax:  [phone]

## 2021-07-29 ENCOUNTER — Ambulatory Visit: Payer: Medicaid Other | Admitting: Family Medicine

## 2021-08-10 ENCOUNTER — Other Ambulatory Visit: Payer: Self-pay | Admitting: Student

## 2021-08-20 ENCOUNTER — Telehealth: Payer: Self-pay | Admitting: Student

## 2021-08-20 MED ORDER — LIDOCAINE VISCOUS HCL 2 % MT SOLN: 5.0000 mL | Freq: Three times a day (TID) | OROMUCOSAL | 0 refills | Status: AC | PRN

## 2021-08-20 NOTE — Telephone Encounter (Signed)
Received after hours page. Patient notes she has had a small ulcer at the bottom of her mouth for the past 3 days. States it is appears to be a canker sore, which she has had in the past. Has been having difficulty brushing her teeth or eating due to this. She does wear dentures and this is irritating the area. She denies fevers, chills, bleeding , drainage, or dental pain. Pain may be due to aphthous ulcer. Will send script for magic mouth wash for pain control. Advised she be seen in clinic for further evaluation and go to urgent care/ ED if symptoms worsen or are not improving.

## 2021-08-30 ENCOUNTER — Other Ambulatory Visit: Payer: Self-pay | Admitting: Student

## 2021-08-30 DIAGNOSIS — G43009 Migraine without aura, not intractable, without status migrainosus: Secondary | ICD-10-CM

## 2021-09-02 ENCOUNTER — Other Ambulatory Visit: Payer: Self-pay

## 2021-09-02 DIAGNOSIS — G43009 Migraine without aura, not intractable, without status migrainosus: Secondary | ICD-10-CM

## 2021-09-02 MED ORDER — RIZATRIPTAN BENZOATE 5 MG PO TABS: ORAL_TABLET | ORAL | 0 refills | Status: DC

## 2021-09-05 ENCOUNTER — Other Ambulatory Visit: Payer: Self-pay | Admitting: Student

## 2021-09-29 ENCOUNTER — Other Ambulatory Visit: Payer: Self-pay

## 2021-09-29 DIAGNOSIS — E039 Hypothyroidism, unspecified: Secondary | ICD-10-CM

## 2021-09-29 MED ORDER — LEVOTHYROXINE SODIUM 112 MCG PO TABS
112.0000 ug | ORAL_TABLET | Freq: Every day | ORAL | 1 refills | Status: DC
Start: 2021-09-29 — End: 2022-03-29

## 2021-10-05 ENCOUNTER — Other Ambulatory Visit: Payer: Self-pay | Admitting: Student

## 2021-10-07 NOTE — Telephone Encounter (Signed)
Pt calling back to follow up with her Refill Request   Pantoprazole Sodium TAKE 1 TABLET BY MOUTH TWICE DAILY  [facility] #[id number] - [place], [place] - [address] AT [facility] OF [address] (Ph: [phone])  Order Details

## 2021-10-12 ENCOUNTER — Other Ambulatory Visit: Payer: Self-pay

## 2021-10-12 MED ORDER — GABAPENTIN 300 MG PO CAPS: 300.0000 mg | ORAL_CAPSULE | Freq: Three times a day (TID) | ORAL | 6 refills | Status: DC

## 2021-10-14 ENCOUNTER — Telehealth: Payer: Self-pay

## 2021-10-14 DIAGNOSIS — J329 Chronic sinusitis, unspecified: Secondary | ICD-10-CM

## 2021-10-14 NOTE — Telephone Encounter (Signed)
Pt is requesting a call back she stated that she was on a nasal spray  few years ago and she is need to have it again .Marland KitchenMarland KitchenMarland Kitchen

## 2021-10-14 NOTE — Telephone Encounter (Signed)
RTC to patient states would like to get nasal spray that she has used before.  Nasal inflammation with some congestion.  Allergy pills are not working.  No fever.  Had same thing before and the nasal spray helped.  Symptoms have been going on since Tuesday.  Was on Flonase.

## 2021-10-18 MED ORDER — FLUTICASONE PROPIONATE 50 MCG/ACT NA SUSP: 1.0000 | Freq: Every day | NASAL | 2 refills | Status: DC

## 2021-10-19 ENCOUNTER — Other Ambulatory Visit: Payer: Self-pay

## 2021-10-19 DIAGNOSIS — I10 Essential (primary) hypertension: Secondary | ICD-10-CM

## 2021-10-19 DIAGNOSIS — G43009 Migraine without aura, not intractable, without status migrainosus: Secondary | ICD-10-CM

## 2021-10-19 MED ORDER — PROPRANOLOL HCL 80 MG PO TABS: ORAL_TABLET | ORAL | 1 refills | Status: DC

## 2021-12-01 ENCOUNTER — Other Ambulatory Visit: Payer: Self-pay

## 2021-12-01 ENCOUNTER — Telehealth: Payer: Self-pay | Admitting: Student

## 2021-12-01 ENCOUNTER — Telehealth: Payer: Self-pay

## 2021-12-01 ENCOUNTER — Encounter: Payer: Self-pay | Admitting: Internal Medicine

## 2021-12-01 ENCOUNTER — Ambulatory Visit (INDEPENDENT_AMBULATORY_CARE_PROVIDER_SITE_OTHER): Payer: Medicaid Other | Admitting: Internal Medicine

## 2021-12-01 DIAGNOSIS — M545 Low back pain, unspecified: Secondary | ICD-10-CM | POA: Diagnosis not present

## 2021-12-01 DIAGNOSIS — M25519 Pain in unspecified shoulder: Secondary | ICD-10-CM

## 2021-12-01 DIAGNOSIS — M549 Dorsalgia, unspecified: Secondary | ICD-10-CM

## 2021-12-01 MED ORDER — CYCLOBENZAPRINE HCL 5 MG PO TABS: ORAL_TABLET | ORAL | 0 refills | Status: DC

## 2021-12-01 NOTE — Telephone Encounter (Signed)
Pt is requesting a call back .. she stated that she hurt her back ( in between her shoulder blades ) but she does not know how .. pt stated that pain started this morning .. level #8

## 2021-12-01 NOTE — Progress Notes (Signed)
°  [facility] Telephone Encounter Continuity Care Appointment  HPI:  This telephone encounter was created for [name] on 12/01/2021 for the following purpose/cc shoulder pain.   Past Medical History:  Past Medical History:  Diagnosis Date   Bipolar 1 disorder (HCC)    Followed by Mental Health.  All psychiatric medications prescribed by Mental Health.  Stable for many years.     Deep venous thrombosis of leg (HCC) 2012   completed year of coumadin    Dyspareunia 03/04/11   Hx of measles    Hypothyroidism    Hypothyroidism since 8th grade.  Managed by Dr. Sharl Ma, Endocrinology.  On synthroid.  No prior thyroid surgery/ablation.    Migraines    Monilia infection 12/31/10   Vaginal atrophy 03/04/11     ROS:  Review of Systems  Constitutional:  Negative for chills and fever.  Respiratory:  Negative for cough and shortness of breath.   Cardiovascular:  Negative for chest pain, palpitations, orthopnea and leg swelling.  Gastrointestinal:  Negative for nausea and vomiting.  Musculoskeletal:  Positive for back pain and myalgias.  Neurological:  Negative for weakness.     Assessment / Plan / Recommendations:  Please see A&P under problem oriented charting for assessment of the patient's acute and chronic medical conditions.  As always, pt is advised that if symptoms worsen or new symptoms arise, they should go to an urgent care facility or to to ER for further evaluation.   Consent and Medical Decision Making:  Patient discussed with Dr. Oswaldo Done This is a telephone encounter between [name] and [name] on 12/01/2021 for back pain. The visit was conducted with the patient located at home and Lem Peary Wendy Cassidi Modesitt at [facility]. The patient's identity was confirmed using their DOB and current address. The patient has consented to being evaluated through a telephone encounter and understands the associated risks (an examination cannot be done and the patient  may need to come in for an appointment) / benefits (allows the patient to remain at home, decreasing exposure to coronavirus). I personally spent 12 minutes on medical discussion.

## 2021-12-01 NOTE — Telephone Encounter (Signed)
Called pt - stated she's having pain between her shoulder blades which started this am. Stated she has some Flexeril tabs. I asked if she has tried heat or cold she stated no. Stated she does not know what happened. She's agreeable to telehealth appt - appt schedule w/Dr Rehman today 12/01/21 @ 1345 PM.

## 2021-12-01 NOTE — Telephone Encounter (Signed)
Received after-hours page from Ms. Scheer. She was seen via telephone visit earlier today for strained back muscle. She reports the pain has continued this evening despite taking Flexeril. She has been using ice/hot packs intermittently without relief. Did mention she felt a little "loopy" when she took the Flexeril earlier today. Explained to Ms. Huy that muscle strains will take time to heal and if the pain becomes excruciating she should visit the Emergency Department for further evaluation. She asked if it was okay to take her next Flexeril (last taken 3.5hr ago). Patient is currently not taking any using any recreational substances (including alcohol) that would exacerbate sedating symptoms. Discussed that it should be fine to take now, especially as Ms. Furth reports she plans to go to bed after taking this next dose. Patient verbalized understanding and will follow-up with [facility] as needed.  Evlyn Kanner, MD Internal Medicine PGY-2 [phone]

## 2021-12-02 ENCOUNTER — Other Ambulatory Visit: Payer: Self-pay

## 2021-12-02 ENCOUNTER — Encounter: Payer: Self-pay | Admitting: Internal Medicine

## 2021-12-02 ENCOUNTER — Telehealth: Payer: Self-pay | Admitting: Student

## 2021-12-02 ENCOUNTER — Ambulatory Visit: Payer: Medicaid Other | Admitting: Internal Medicine

## 2021-12-02 DIAGNOSIS — M545 Low back pain, unspecified: Secondary | ICD-10-CM

## 2021-12-02 DIAGNOSIS — M549 Dorsalgia, unspecified: Secondary | ICD-10-CM | POA: Insufficient documentation

## 2021-12-02 NOTE — Telephone Encounter (Signed)
Received after-hours page from Ms. Goytia regarding upper back/neck pain. Patient was seen via telephone by Dr. Karilyn Cota yesterday and in-person earlier today with Dr. Alroy Bailiff. This provider also spoke to her over the phone last night. She reports that she was upset earlier today in the clinic, mentioning that she felt belittled and that she needed some pain meds for sleep tonight. Discussed with Ms. Lennon that typically for muscle strains the treatment consists of conservative therapy, including anti-inflammatories, physical therapy, and ice/heat. Ms. Barner re-iterated the severity of her pain. I told Ms. Lanahan that it is not appropriate nor do I feel comfortable prescribing opioid pain medications for this, especially as I have not evaluated her in person. Discussed that she can call our front desk for preference of providers and schedule a future appointment to discuss escalation of pain regimen. Ms. Pfefferkorn verbalized understanding and reported she would use ice tonight on her back to try and sleep.   - Continue Flexeril, Tylenol, exercises - Follow-up with [facility] as needed  Evlyn Kanner, MD Internal Medicine PGY-2 [phone]

## 2021-12-02 NOTE — Assessment & Plan Note (Addendum)
Pt was seen today in the clinic for a 1 day history of upper/mid back pain.  She states that she was lying on her couch yesterday when all of a sudden she had severe back pain, she is unsure what she did to cause the pain.  Her pain has been worsening since onset.  She was seen yesterday for virtual visit with Dr. Karilyn Cota, at which time she was prescribed Flexeril and advised to take Tylenol as well.  She states that neither of these is helping the pain.  She has used ice for the pain, but this only helps for a few minutes.  Denies numbness/tingling.  This is never happened to her before.  States that Percocet has worked for her in the past.  Plan: I discussed with the patient that she likely has a muscle strain.  Advised that she will need to try Flexeril and Tylenol for a longer period of time to see if this alleviates her symptoms.  I discussed that I can provide stretching exercises as well.  If her symptoms persist after several weeks, she can follow-up in our clinic for consideration of imaging.  Patient asked for Percocet again, however I discussed that this is not indicated for the situation.  Patient was visibly upset and left the room before I could provide her discharge paperwork with home stretching exercises.

## 2021-12-02 NOTE — Progress Notes (Signed)
° °  CC: upper/mid back pain  HPI:  [name] is a 51 y.o. with past medical history as noted below who presents to the clinic today for upper/mid back pain. Please see problem-based list for further details, assessments, and plans.  Past Medical History:  Diagnosis Date   Bipolar 1 disorder (HCC)    Followed by Mental Health.  All psychiatric medications prescribed by Mental Health.  Stable for many years.     Deep venous thrombosis of leg (HCC) 2012   completed year of coumadin    Dyspareunia 03/04/11   Hx of measles    Hypothyroidism    Hypothyroidism since 8th grade.  Managed by Dr. Sharl Ma, Endocrinology.  On synthroid.  No prior thyroid surgery/ablation.    Migraines    Monilia infection 12/31/10   Vaginal atrophy 03/04/11   Review of Systems: Negative aside from that listed in individualized problem based charting.  Physical Exam:  Vitals:   12/02/21 1451  BP: (!) 132/94  Pulse: 90  Temp: 98.6 F (37 C)  TempSrc: Oral  SpO2: 98%  Weight: 230 lb 11.2 oz (104.6 kg)  Height: 5\' 5"  (1.651 m)   General: NAD, nl appearance HE: Normocephalic, atraumatic, EOMI, Conjunctivae normal ENT: No congestion, no rhinorrhea, no exudate or erythema  Cardiovascular: Normal rate, regular rhythm. No murmurs, rubs, or gallops Pulmonary: Effort normal, breath sounds normal. No wheezes, rales, or rhonchi Abdominal: soft, nontender, bowel sounds present Musculoskeletal: Tenderness to palpation of the upper and mid back on both sides.  No numbness or tingling present.  Pain with shoulder abduction bilaterally.  Tenderness to palpation of neck musculature on both sides. Skin: Warm, dry, no bruising, erythema, or rash Psychiatric/Behavioral: normal mood, normal behavior     Assessment & Plan:   See Encounters Tab for problem based charting.  Patient discussed with Dr. 

## 2021-12-03 ENCOUNTER — Telehealth: Payer: Self-pay | Admitting: Internal Medicine

## 2021-12-03 MED ORDER — CYCLOBENZAPRINE HCL 10 MG PO TABS: 10.0000 mg | ORAL_TABLET | Freq: Three times a day (TID) | ORAL | 0 refills | Status: DC | PRN

## 2021-12-03 MED ORDER — DICLOFENAC SODIUM 1 % EX GEL: 4.0000 g | Freq: Four times a day (QID) | CUTANEOUS | 0 refills | Status: DC

## 2021-12-03 NOTE — Telephone Encounter (Signed)
Spoke with patient regarding back pain. She called upset regarding her management and requesting opioid pain medication. She had a telehealth appointment yesterday with Dr. Lorin Glass and spoke with the on call resident overnight.  Patient reports developing a "catch" in her neck 2 days ago while laying on the couch with radiation to her bilateral shoulders.  Yesterday, the pain migrated and now also radiates to the front of her chest.  She denies shortness of breath, has never had pain like this before.  No GI symptoms.  Denies paresthesias and weakness in her arms. Vitals from yesterday's visit were all within normal limits She reports stiffness and limited range of motion in her neck.  Neck extension and flexion reproduces the pain.  She has been icing her neck and taking 1000 mg of Tylenol every 6 hours and 5 mg of Flexeril TID without improvement.  She reports a history of PUD and cannot take NSAIDs. Suspect cervical neck sprain, low suspicion for cardiac chest pain given reproducibility with neck movement.  I explained that opioid pain medications is not appropriate and not typically prescribed for this type of injury.  She expressed understanding. I advised her to try applying heat to relax the muscles.  I sent a refill of Flexeril and increase her dose to 10 mg TID PRN.  Also sent prescription for topical Voltaren gel to apply to the neck as needed.  Explained patient that these types of cervical sprains can sometimes take weeks to resolve.  She will call back if symptoms do not improve.

## 2021-12-03 NOTE — Addendum Note (Signed)
Addended by: Burnell Blanks on: 12/03/2021 11:30 AM   Modules accepted: Orders

## 2021-12-03 NOTE — Assessment & Plan Note (Signed)
Patient called for a telehelath visit regarding back pain between her shoulder blades and lower neck. States she was on her couch and suddenly got up to check the mail when the pain started. Denies any chest pan, SOB, n/v, fevers. She is having an achy pain in between her shoulder blades and lower neck range. States it is worse with movement. She took a flexeril and states it helped some. Discussed that this is likely a muscle strain. Recommended she continue using flexeril prn as well as heat/ice therapy, icy hot etc.

## 2021-12-06 NOTE — Progress Notes (Signed)
Internal Medicine Clinic Attending  Case discussed with Dr. Rehman  At the time of the visit.  We reviewed the resident's history and pertinent patient test results.  I agree with the assessment, diagnosis, and plan of care documented in the resident's note.  

## 2021-12-14 NOTE — Progress Notes (Signed)
Internal Medicine Clinic Attending ° °Case discussed with Dr. DeMaio  At the time of the visit.  We reviewed the resident’s history and exam and pertinent patient test results.  I agree with the assessment, diagnosis, and plan of care documented in the resident’s note. ° ° °

## 2021-12-24 ENCOUNTER — Encounter (HOSPITAL_COMMUNITY): Payer: Self-pay | Admitting: Emergency Medicine

## 2021-12-24 ENCOUNTER — Emergency Department (HOSPITAL_COMMUNITY)
Admission: EM | Admit: 2021-12-24 | Discharge: 2021-12-25 | Disposition: A | Discharge status: Home / Self Care | Payer: Medicaid Other | Attending: Emergency Medicine | Admitting: Emergency Medicine

## 2021-12-24 ENCOUNTER — Other Ambulatory Visit: Payer: Self-pay

## 2021-12-24 ENCOUNTER — Emergency Department (HOSPITAL_COMMUNITY): Payer: Medicaid Other

## 2021-12-24 DIAGNOSIS — H5789 Other specified disorders of eye and adnexa: Secondary | ICD-10-CM | POA: Insufficient documentation

## 2021-12-24 DIAGNOSIS — H579 Unspecified disorder of eye and adnexa: Secondary | ICD-10-CM

## 2021-12-24 NOTE — ED Triage Notes (Signed)
Pt sent to ED by opthalmology for MRI to rule ot mass and vascular lesions after noticing Pallor to right eye. Pt states she is experiencing "flutters" in bilateral eyes post dilation for vision exam today but denies any other symptoms.

## 2021-12-24 NOTE — ED Provider Notes (Signed)
[facility] EMERGENCY DEPARTMENT Provider Note   CSN: [id number] Arrival date & time: 12/24/21  1838     History  Chief Complaint  Patient presents with   Eye Problem    [name] is a 51 y.o. female.  Here from optometrist office 2/2 abnormal funduscopic exam. She has had flashers in left eye for about a week. 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 No trauma. No new meds. No h/o neurologic or vascular disorders. No recent illnesses. No h/o same.    Eye Problem     Home Medications Prior to Admission medications   Medication Sig Start Date End Date Taking? Authorizing Provider  albuterol (VENTOLIN HFA) 108 (90 Base) MCG/ACT inhaler Inhale 1-2 puffs into the lungs every 6 (six) hours as needed for wheezing or shortness of breath. 11/10/20   Elige Radon, MD  ARIPiprazole (ABILIFY) 30 MG tablet Take 30 mg by mouth at bedtime. 05/20/16   [provider]  buPROPion (WELLBUTRIN XL) 150 MG 24 hr tablet Take 150 mg by mouth daily.  05/21/16   [provider]  busPIRone (BUSPAR) 15 MG tablet Take 15 mg by mouth 4 (four) times daily. 03/28/19   [provider]  butalbital-acetaminophen-caffeine (FIORICET) 50-325-40 MG tablet TAKE 1 TABLET BY MOUTH EVERY 5 TO 7 DAYS AS NEEDED FOR SEVERE MIGRAINE 04/01/21   Elige Radon, MD  clotrimazole (LOTRIMIN) 1 % cream Apply 1 application topically 2 (two) times daily. Until rash resolves 01/27/21   Seawell, Jaimie A, DO  cyclobenzaprine (FLEXERIL) 10 MG tablet Take 1 tablet (10 mg total) by mouth 3 (three) times daily as needed for muscle spasms. Take one tablet daily as needed. 12/03/21   Reymundo Poll, MD  diclofenac Sodium (VOLTAREN) 1 % GEL Apply 4 g topically 4 (four) times daily. 12/03/21   Reymundo Poll, MD  estrogens, conjugated, (PREMARIN) 0.45 MG tablet Take 1 tablet (0.45 mg total) by mouth daily. Take daily for 21 days  then do not take for 7 days. 05/10/21 05/10/22  Federico Flake, MD  fluticasone (FLONASE) 50 MCG/ACT nasal spray Place 1 spray into both nostrils daily for 14 days. 10/18/21 11/01/21  Doran Stabler, DO  gabapentin (NEURONTIN) 300 MG capsule Take 1 capsule (300 mg total) by mouth 3 (three) times daily. 10/12/21   Doran Stabler, DO  hydrOXYzine (VISTARIL) 25 MG capsule Take 25 mg by mouth every 8 (eight) hours as needed for anxiety.  02/07/17   [provider]  levothyroxine (SYNTHROID) 112 MCG tablet Take 1 tablet (112 mcg total) by mouth daily before breakfast. 09/29/21   Belva Agee, MD  magic mouthwash w/lidocaine SOLN Take 5 mLs by mouth 4 (four) times daily as needed for mouth pain. 08/02/20   Moshe Cipro, NP  meclizine (ANTIVERT) 25 MG tablet TAKE 1 TABLET(25 MG) BY MOUTH TWICE DAILY AS NEEDED FOR DIZZINESS 06/02/21   Inez Catalina, MD  metoCLOPramide (REGLAN) 10 MG tablet Take 1 tablet (10 mg total) by mouth 3 (three) times daily as needed for nausea. 10/17/19 10/16/20  Masoudi, Shawna Orleans, MD  nystatin (MYCOSTATIN/NYSTOP) powder Apply topically 4 (four) times daily. 01/01/17   Burns, Tinnie Gens, MD  pantoprazole (PROTONIX) 40 MG tablet TAKE 1 TABLET BY MOUTH TWICE DAILY 10/07/21   Doran Stabler, DO  propranolol (INDERAL) 80 MG tablet TAKE 1 TABLET(80 MG) BY MOUTH THREE TIMES DAILY 10/19/21   Doran Stabler, DO  rizatriptan (  MAXALT) 5 MG tablet May repeat in 2 hours if needed 09/02/21   Belva Agee, MD  simvastatin (ZOCOR) 40 MG tablet TAKE 1 TABLET BY MOUTH EVERY DAY AT 6 PM 06/16/21   Doran Stabler, DO  traZODone (DESYREL) 100 MG tablet Take 1 tablet (100 mg total) by mouth at bedtime. For sleep Patient not taking: Reported on 04/05/2021 06/28/19   Earl Lagos, MD      Allergies    Codeine, Morphine, Sulfa antibiotics, Sulfonamide derivatives, Meloxicam, Morphine and related, and Naproxen    Review of Systems   Review of Systems  Physical Exam Updated  Vital Signs BP 130/78    Pulse 84    Temp 98.6 F (37 C) (Oral)    Resp 18    SpO2 100%  Physical Exam Vitals and nursing note reviewed.  Constitutional:      Appearance: She is well-developed.  HENT:     Head: Normocephalic and atraumatic.     Mouth/Throat:     Mouth: Mucous membranes are moist.     Pharynx: Oropharynx is clear.  Eyes:     Comments: Pupils approximately 9 mm and nonreactive bilaterally Funduscopic exam done without obvious hemorrhage. Can't tell if there is optic pallor or not.   Cardiovascular:     Rate and Rhythm: Normal rate and regular rhythm.  Pulmonary:     Effort: No respiratory distress.     Breath sounds: No stridor.  Abdominal:     General: There is no distension.  Musculoskeletal:        General: No swelling or tenderness. Normal range of motion.     Cervical back: Normal range of motion.  Skin:    General: Skin is warm and dry.  Neurological:     General: No focal deficit present.     Mental Status: She is alert and oriented to person, place, and time.     Cranial Nerves: No cranial nerve deficit.     Sensory: No sensory deficit.     Motor: No weakness.    ED Results / Procedures / Treatments   Labs (all labs ordered are listed, but only abnormal results are displayed) Labs Reviewed  I-STAT CHEM 8, ED - Abnormal; Notable for the following components:      Result Value   BUN 22 (*)    Creatinine, Ser 1.40 (*)    Glucose, Bld 121 (*)    All other components within normal limits    EKG None  Radiology CT Angio Head W or Wo Contrast  Result Date: 12/25/2021 CLINICAL DATA:  Right eye pallor, flutters in bilateral eyes post dilation EXAM: CT ANGIOGRAPHY HEAD TECHNIQUE: Multidetector CT imaging of the head was performed using the standard protocol during bolus administration of intravenous contrast. Multiplanar CT image reconstructions and MIPs were obtained to evaluate the vascular anatomy. RADIATION DOSE REDUCTION: This exam was performed  according to the departmental dose-optimization program which includes automated exposure control, adjustment of the mA and/or kV according to patient size and/or use of iterative reconstruction technique. CONTRAST:  18mL OMNIPAQUE IOHEXOL 350 MG/ML SOLN COMPARISON:  No prior CTA, correlation is made with CT head 04/30/2012 FINDINGS: CT HEAD Brain: No evidence of acute infarction, hemorrhage, cerebral edema, mass, mass effect, or midline shift. No hydrocephalus or extra-axial fluid collection. Redemonstrated cerebellar volume loss unchanged, compared to 2013. Vascular: No hyperdense vessel. Skull: Hyperostosis frontalis. Negative for fracture or focal lesion. Sinuses/Orbits: No acute finding. Other: None. CTA HEAD Anterior circulation: Both internal carotid  arteries are patent to the termini, without significant stenosis. Normal ophthalmic arteries bilaterally. A1 segments patent. Normal anterior communicating artery. Anterior cerebral arteries are patent to their distal aspects. No M1 stenosis or occlusion. Normal MCA bifurcations. Distal MCA branches perfused and symmetric. Posterior circulation: Vertebral arteries patent to the vertebrobasilar junction without stenosis, although the left vertebral artery primarily supplies PICA. Posterior inferior cerebral arteries patent bilaterally. Basilar patent to its distal aspect. Superior cerebellar arteries patent bilaterally. Fetal origin of the right PCA, from a patent right posterior communicating artery. Patent left P1. PCAs perfused to their distal aspects without stenosis. The left posterior communicating artery is not visualized. Venous sinuses: As permitted by contrast timing, patent. Anatomic variants: Fetal origin of the right PCA. Review of the MIP images confirms the above findings IMPRESSION: 1.  No acute intracranial process. 2.  No intracranial large vessel occlusion or significant stenosis. Electronically Signed   By: Wiliam Ke M.D.   On: 12/25/2021  02:56    Procedures Procedures    Medications Ordered in ED Medications  acetaminophen (TYLENOL) tablet 1,000 mg (has no administration in time range)  iohexol (OMNIPAQUE) 350 MG/ML injection 75 mL (75 mLs Intravenous Contrast Given 12/25/21 0243)    ED Course/ Medical Decision Making/ A&P                           Medical Decision Making Amount and/or Complexity of Data Reviewed Radiology: ordered.  Risk OTC drugs. Prescription drug management.   No e/o retinal detachment.  Possible tumor, cva, arterial disease still on ddx. Discussed brieflly with neurology, not as an official consult, regarding imaging and will start with MRI/CTA. If normal, can follow up outpatient.  Patient couldn't tolerate MRI and refused to try with ativan. Understands the risk of missing a possible small or early stroke. CTA ok. Will follow up with Neuro for further management.   Final Clinical Impression(s) / ED Diagnoses Final diagnoses:  Eye problem    Rx / DC Orders ED Discharge Orders          Ordered    Ambulatory referral to Neurology       Comments: An appointment is requested in approximately: 1 week   12/25/21 0303              Edynn Gillock, Barbara Cower, MD 12/25/21 [phone]

## 2021-12-25 ENCOUNTER — Emergency Department (HOSPITAL_COMMUNITY): Payer: Medicaid Other

## 2021-12-25 LAB — I-STAT CHEM 8, ED
BUN: 22 mg/dL — ABNORMAL HIGH (ref 6–20)
Calcium, Ion: 1.15 mmol/L (ref 1.15–1.40)
Chloride: 107 mmol/L (ref 98–111)
Creatinine, Ser: 1.4 mg/dL — ABNORMAL HIGH (ref 0.44–1.00)
Glucose, Bld: 121 mg/dL — ABNORMAL HIGH (ref 70–99)
HCT: 37 % (ref 36.0–46.0)
Hemoglobin: 12.6 g/dL (ref 12.0–15.0)
Potassium: 4 mmol/L (ref 3.5–5.1)
Sodium: 143 mmol/L (ref 135–145)
TCO2: 26 mmol/L (ref 22–32)

## 2021-12-25 MED ORDER — ACETAMINOPHEN 500 MG PO TABS
1000.0000 mg | ORAL_TABLET | Freq: Once | ORAL | Status: DC
  Filled 2021-12-25: qty 2

## 2021-12-25 MED ORDER — LORAZEPAM 2 MG/ML IJ SOLN
INTRAMUSCULAR | Status: AC
  Filled 2021-12-25: qty 1

## 2021-12-25 MED ORDER — IOHEXOL 350 MG/ML SOLN
75.0000 mL | Freq: Once | INTRAVENOUS | Status: AC | PRN
Start: 2021-12-25 — End: 2021-12-25
  Administered 2021-12-25: 75 mL via INTRAVENOUS

## 2021-12-25 NOTE — ED Notes (Signed)
MRI calls to medicate patient for anxiety. Arrived to MRI, patient refusing medication and refusing MRI. Informed on use of medication and purpose of MRI. Still refuses. Mesner MD notified. Patient transported back to hall bed.

## 2021-12-25 NOTE — ED Notes (Signed)
Pt verbalized understanding of d/c instructions, meds, and followup care. Denies questions. VSS, no distress noted. Steady gait to exit with all belongings.  ?

## 2021-12-30 ENCOUNTER — Ambulatory Visit: Payer: Medicaid Other | Admitting: Diagnostic Neuroimaging

## 2021-12-30 ENCOUNTER — Encounter (HOSPITAL_COMMUNITY): Payer: Self-pay | Admitting: Emergency Medicine

## 2021-12-30 ENCOUNTER — Emergency Department (HOSPITAL_COMMUNITY)
Admission: EM | Admit: 2021-12-30 | Discharge: 2021-12-31 | Disposition: A | Discharge status: Home / Self Care | Payer: Medicaid Other | Attending: Emergency Medicine | Admitting: Emergency Medicine

## 2021-12-30 ENCOUNTER — Other Ambulatory Visit: Payer: Self-pay

## 2021-12-30 ENCOUNTER — Encounter: Payer: Self-pay | Admitting: Diagnostic Neuroimaging

## 2021-12-30 VITALS — BP 128/85 | HR 71 | Ht 65.0 in | Wt 231.0 lb

## 2021-12-30 DIAGNOSIS — H5709 Other anomalies of pupillary function: Secondary | ICD-10-CM | POA: Diagnosis not present

## 2021-12-30 DIAGNOSIS — H547 Unspecified visual loss: Secondary | ICD-10-CM

## 2021-12-30 DIAGNOSIS — H4710 Unspecified papilledema: Secondary | ICD-10-CM

## 2021-12-30 DIAGNOSIS — G44039 Episodic paroxysmal hemicrania, not intractable: Secondary | ICD-10-CM | POA: Diagnosis not present

## 2021-12-30 DIAGNOSIS — E039 Hypothyroidism, unspecified: Secondary | ICD-10-CM | POA: Insufficient documentation

## 2021-12-30 DIAGNOSIS — D72829 Elevated white blood cell count, unspecified: Secondary | ICD-10-CM | POA: Insufficient documentation

## 2021-12-30 DIAGNOSIS — G4489 Other headache syndrome: Secondary | ICD-10-CM | POA: Diagnosis not present

## 2021-12-30 DIAGNOSIS — H539 Unspecified visual disturbance: Secondary | ICD-10-CM | POA: Insufficient documentation

## 2021-12-30 LAB — CBC WITH DIFFERENTIAL/PLATELET
Abs Immature Granulocytes: 0.06 10*3/uL (ref 0.00–0.07)
Basophils Absolute: 0 10*3/uL (ref 0.0–0.1)
Basophils Relative: 0 %
Eosinophils Absolute: 0.2 10*3/uL (ref 0.0–0.5)
Eosinophils Relative: 1 %
HCT: 43.6 % (ref 36.0–46.0)
Hemoglobin: 14.4 g/dL (ref 12.0–15.0)
Immature Granulocytes: 0 %
Lymphocytes Relative: 31 %
Lymphs Abs: 4.6 10*3/uL — ABNORMAL HIGH (ref 0.7–4.0)
MCH: 28.8 pg (ref 26.0–34.0)
MCHC: 33 g/dL (ref 30.0–36.0)
MCV: 87.2 fL (ref 80.0–100.0)
Monocytes Absolute: 0.8 10*3/uL (ref 0.1–1.0)
Monocytes Relative: 5 %
Neutro Abs: 9.1 10*3/uL — ABNORMAL HIGH (ref 1.7–7.7)
Neutrophils Relative %: 63 %
Platelets: 305 10*3/uL (ref 150–400)
RBC: 5 MIL/uL (ref 3.87–5.11)
RDW: 13.6 % (ref 11.5–15.5)
WBC: 14.8 10*3/uL — ABNORMAL HIGH (ref 4.0–10.5)
nRBC: 0 % (ref 0.0–0.2)

## 2021-12-30 LAB — URINALYSIS, ROUTINE W REFLEX MICROSCOPIC
Bilirubin Urine: NEGATIVE
Glucose, UA: NEGATIVE mg/dL
Hgb urine dipstick: NEGATIVE
Ketones, ur: NEGATIVE mg/dL
Nitrite: NEGATIVE
Protein, ur: NEGATIVE mg/dL
Specific Gravity, Urine: 1.018 (ref 1.005–1.030)
pH: 8 (ref 5.0–8.0)

## 2021-12-30 LAB — COMPREHENSIVE METABOLIC PANEL
ALT: 18 U/L (ref 0–44)
AST: 22 U/L (ref 15–41)
Albumin: 3.9 g/dL (ref 3.5–5.0)
Alkaline Phosphatase: 80 U/L (ref 38–126)
Anion gap: 8 (ref 5–15)
BUN: 13 mg/dL (ref 6–20)
CO2: 26 mmol/L (ref 22–32)
Calcium: 9.2 mg/dL (ref 8.9–10.3)
Chloride: 106 mmol/L (ref 98–111)
Creatinine, Ser: 1.58 mg/dL — ABNORMAL HIGH (ref 0.44–1.00)
GFR, Estimated: 39 mL/min — ABNORMAL LOW (ref >60)
Glucose, Bld: 88 mg/dL (ref 70–99)
Potassium: 4.1 mmol/L (ref 3.5–5.1)
Sodium: 140 mmol/L (ref 135–145)
Total Bilirubin: 0.5 mg/dL (ref 0.3–1.2)
Total Protein: 7.1 g/dL (ref 6.5–8.1)

## 2021-12-30 MED ORDER — ACETAZOLAMIDE 250 MG PO TABS
250.0000 mg | ORAL_TABLET | Freq: Two times a day (BID) | ORAL | 3 refills | Status: DC
Start: 2021-12-30 — End: 2022-04-27

## 2021-12-30 MED ORDER — ALPRAZOLAM 0.5 MG PO TABS
ORAL_TABLET | ORAL | 0 refills | Status: DC
Start: 2021-12-30 — End: 2022-11-01

## 2021-12-30 NOTE — ED Provider Triage Note (Addendum)
Emergency Medicine Provider Triage Evaluation Note ? ?Billey Gosling , a 51 y.o. female  was evaluated in triage.  Pt sent by Neuro office today for MRI and LP.  Pt was having vision changes, started on acetazolamide.  Took first ever pill one hour prior to ED arrival, reports significant improvement. ? ?Review of Systems  ?Positive: Vision changes ?Negative: Headache ? ?Physical Exam  ?BP (!) 134/100   Pulse 79   Temp 99.8 ?F (37.7 ?C) (Oral)   Resp 16   SpO2 95%  ?Gen:   Awake, no distress   ?Resp:  Normal effort  ?MSK:   Moves extremities without difficulty  ?Other:  Negative for vision loss on exam ? ?Medical Decision Making  ?Medically screening exam initiated at 7:48 PM.  Appropriate orders placed.  REY MCCADDEN was informed that the remainder of the evaluation will be completed by another provider, this initial triage assessment does not replace that evaluation, and the importance of remaining in the ED until their evaluation is complete. ? ?  ?Prince Rome, PA-C ?[id number] ? ?Labs ordered ?  ?Prince Rome, PA-C ?[id number] ? ?

## 2021-12-30 NOTE — ED Triage Notes (Signed)
Pt reported to ED for further evaluation following appointment with [facility] today. Pt has paperwork in hand with recommendations if MRI w/wo contrast and LP exam for papilledema and vision loss. Pt was evaluated previously in this ED for similar symptoms and  was unable to tolerate MRI.  ?

## 2021-12-30 NOTE — Progress Notes (Signed)
[facility]  PATIENT: [name] DOB: 08-28-1971  REFERRING CLINICIAN: Mesner, Corene Cornea, MD HISTORY FROM: patient  REASON FOR VISIT: new consult   HISTORICAL  CHIEF COMPLAINT:  Chief Complaint  Patient presents with   Eye concerns    Rm 7 ED referral     HISTORY OF PRESENT ILLNESS:   51 year old female here for evaluation of vision loss and papilledema.  Symptoms started about 1 week ago with gradual and progressive vision changes affecting the left greater than right eye.  Patient went to [facility] was noted to have bilateral papilledema.  Optic nerve pallor noted on the right side.  She was sent to the emergency room for evaluation on 12/24/21.  CTA of the head was unremarkable.  MRI of the brain was attempted but could not be completed due to claustrophobia.  No change in weight or stress lately.  No change in medications except for addition of Klonopin recently.   REVIEW OF SYSTEMS: Full 14 system review of systems performed and negative with exception of: as per HPI  ALLERGIES: Allergies  Allergen Reactions   Codeine Nausea And Vomiting and Rash   Morphine Shortness Of Breath   Sulfa Antibiotics Shortness Of Breath   Sulfonamide Derivatives Shortness Of Breath, Nausea Only and Rash   Meloxicam Nausea Only   Morphine And Related Other (See Comments)    Makes me loopy and hallucinates   Naproxen Nausea Only and Rash    HOME MEDICATIONS: Outpatient Medications Prior to Visit  Medication Sig Dispense Refill   albuterol (VENTOLIN HFA) 108 (90 Base) MCG/ACT inhaler Inhale 1-2 puffs into the lungs every 6 (six) hours as needed for wheezing or shortness of breath. 8 g 2   ARIPiprazole (ABILIFY) 30 MG tablet Take 30 mg by mouth at bedtime.  2   buPROPion (WELLBUTRIN XL) 150 MG 24 hr tablet Take 150 mg by mouth daily.   2   busPIRone (BUSPAR) 15 MG tablet Take 15 mg by mouth 4 (four) times daily.     butalbital-acetaminophen-caffeine (FIORICET)  50-325-40 MG tablet TAKE 1 TABLET BY MOUTH EVERY 5 TO 7 DAYS AS NEEDED FOR SEVERE MIGRAINE 5 tablet 0   clotrimazole (LOTRIMIN) 1 % cream Apply 1 application topically 2 (two) times daily. Until rash resolves 30 g 0   cyclobenzaprine (FLEXERIL) 10 MG tablet Take 1 tablet (10 mg total) by mouth 3 (three) times daily as needed for muscle spasms. Take one tablet daily as needed. 30 tablet 0   diclofenac Sodium (VOLTAREN) 1 % GEL Apply 4 g topically 4 (four) times daily. 350 g 0   fluticasone (FLONASE) 50 MCG/ACT nasal spray Place 1 spray into both nostrils daily for 14 days. 18.2 mL 2   gabapentin (NEURONTIN) 300 MG capsule Take 1 capsule (300 mg total) by mouth 3 (three) times daily. 270 capsule 6   hydrOXYzine (ATARAX) 25 MG tablet Take 25 mg by mouth in the morning, at noon, in the evening, and at bedtime.     hydrOXYzine (VISTARIL) 25 MG capsule Take 25 mg by mouth every 8 (eight) hours as needed for anxiety.   2   levothyroxine (SYNTHROID) 112 MCG tablet Take 1 tablet (112 mcg total) by mouth daily before breakfast. 90 tablet 1   magic mouthwash w/lidocaine SOLN Take 5 mLs by mouth 4 (four) times daily as needed for mouth pain. 100 mL 0   meclizine (ANTIVERT) 25 MG tablet TAKE 1 TABLET(25 MG) BY MOUTH TWICE DAILY AS  NEEDED FOR DIZZINESS 60 tablet 5   metoCLOPramide (REGLAN) 10 MG tablet Take 1 tablet (10 mg total) by mouth 3 (three) times daily as needed for nausea. 10 tablet 0   nystatin (MYCOSTATIN/NYSTOP) powder Apply topically 4 (four) times daily. 15 g 0   pantoprazole (PROTONIX) 40 MG tablet TAKE 1 TABLET BY MOUTH TWICE DAILY 180 tablet 0   propranolol (INDERAL) 80 MG tablet TAKE 1 TABLET(80 MG) BY MOUTH THREE TIMES DAILY 270 tablet 1   rizatriptan (MAXALT) 5 MG tablet May repeat in 2 hours if needed 12 tablet 0   simvastatin (ZOCOR) 40 MG tablet TAKE 1 TABLET BY MOUTH EVERY DAY AT 6 PM 90 tablet 3   traZODone (DESYREL) 100 MG tablet Take 1 tablet (100 mg total) by mouth at bedtime. For  sleep 10 tablet 0   estrogens, conjugated, (PREMARIN) 0.45 MG tablet Take 1 tablet (0.45 mg total) by mouth daily. Take daily for 21 days then do not take for 7 days. (Patient not taking: Reported on 12/30/2021) 90 tablet 3   Facility-Administered Medications Prior to Visit  Medication Dose Route Frequency Provider Last Rate Last Admin   betamethasone acetate-betamethasone sodium phosphate (CELESTONE) injection 3 mg  3 mg Intramuscular Once Edrick Kins, DPM        PAST MEDICAL HISTORY: Past Medical History:  Diagnosis Date   Bipolar 1 disorder ([place])    Followed by Mental Health.  All psychiatric medications prescribed by Mental Health.  Stable for many years.     Deep venous thrombosis of leg ([place]) 2012   completed year of coumadin    Dyspareunia 03/04/11   Hx of measles    Hypothyroidism    Hypothyroidism since 8th grade.  Managed by Dr. Buddy Duty, Endocrinology.  On synthroid.  No prior thyroid surgery/ablation.    Migraines    Monilia infection 12/31/10   Vaginal atrophy 03/04/11    PAST SURGICAL HISTORY: Past Surgical History:  Procedure Laterality Date   ABDOMINAL HYSTERECTOMY  2000   APPENDECTOMY     CESAREAN SECTION  1998   CHOLECYSTECTOMY     left elbow     REPLACEMENT TOTAL KNEE  2001   right knee   TONSILLECTOMY     WISDOM TOOTH EXTRACTION      FAMILY HISTORY: Family History  Problem Relation Age of Onset   Hypertension Mother    Other Father    Cancer Maternal Aunt     SOCIAL HISTORY: Social History   Socioeconomic History   Marital status: Legally Separated    Spouse name: Not on file   Number of children: 1   Years of education: Not on file   Highest education level: Bachelor's degree (e.g., BA, AB, BS)  Occupational History   Not on file  Tobacco Use   Smoking status: Every Day    Packs/day: 0.10    Types: Cigarettes   Smokeless tobacco: Never   Tobacco comments:    3 cigs per day   Vaping Use   Vaping Use: Never used  Substance and Sexual  Activity   Alcohol use: No    Alcohol/week: 0.0 standard drinks   Drug use: No   Sexual activity: Not on file    Comment: hysterectomy  Other Topics Concern   Not on file  Social History Narrative   Drink Mtn Dew, Coke all day   Social Determinants of Health   Financial Resource Strain: Not on file  Food Insecurity: Not on file  Transportation Needs:  Not on file  Physical Activity: Not on file  Stress: Not on file  Social Connections: Not on file  Intimate Partner Violence: Not on file     PHYSICAL EXAM  GENERAL EXAM/CONSTITUTIONAL: Vitals:  Vitals:   12/30/21 1520  BP: 128/85  Pulse: 71  Weight: 231 lb (104.8 kg)  Height: 5\' 5"  (1.651 m)   Body mass index is 38.44 kg/m. Wt Readings from Last 3 Encounters:  12/30/21 231 lb (104.8 kg)  12/02/21 230 lb 11.2 oz (104.6 kg)  04/05/21 223 lb 12.8 oz (101.5 kg)   Patient is in no distress; well developed, nourished and groomed; neck is supple  CARDIOVASCULAR: Examination of carotid arteries is normal; no carotid bruits Regular rate and rhythm, no murmurs Examination of peripheral vascular system by observation and palpation is normal  EYES: Ophthalmoscopic exam of optic discs and posterior segments is normal; no papilledema or hemorrhages No results found. --> CAN COUNT FINGERS AT 24 INCHES; CANNOT READ ANY LETTERS ON SNELLEN CHART  MUSCULOSKELETAL: Gait, strength, tone, movements noted in Neurologic exam below  NEUROLOGIC: MENTAL STATUS:  No flowsheet data found. awake, alert, oriented to person, place and time recent and remote memory intact normal attention and concentration language fluent, comprehension intact, naming intact fund of knowledge appropriate  CRANIAL NERVE:  2nd - BILATERAL PAPILLEDEMA 2nd, 3rd, 4th, 6th - RIGHT PUPIL 5-->3 SLUGGISH; LEFT MINIMAL REACTION TO DIRECT (5-->4) AND RELATIVE AFFERENT PUPILLARY DEFECT; visual fields full to confrontation, extraocular muscles intact, no  nystagmus 5th - facial sensation symmetric 7th - facial strength symmetric 8th - hearing intact 9th - palate elevates symmetrically, uvula midline 11th - shoulder shrug symmetric 12th - tongue protrusion midline  MOTOR:  normal bulk and tone, full strength in the BUE, BLE  SENSORY:  normal and symmetric to light touch, temperature, vibration  COORDINATION:  finger-nose-finger, fine finger movements normal  REFLEXES:  deep tendon reflexes TRACE and symmetric  GAIT/STATION:  narrow based gait     DIAGNOSTIC DATA (LABS, IMAGING, TESTING) - I reviewed patient records, labs, notes, testing and imaging myself where available.  Lab Results  Component Value Date   WBC 11.3 (H) 10/07/2020   HGB 12.6 12/25/2021   HCT 37.0 12/25/2021   MCV 83 10/07/2020   PLT 296 10/07/2020      Component Value Date/Time   NA 143 12/25/2021 0144   NA 143 10/07/2020 1614   K 4.0 12/25/2021 0144   CL 107 12/25/2021 0144   CO2 26 10/07/2020 1614   GLUCOSE 121 (H) 12/25/2021 0144   BUN 22 (H) 12/25/2021 0144   BUN 15 10/07/2020 1614   CREATININE 1.40 (H) 12/25/2021 0144   CREATININE 1.12 (H) 05/29/2015 1536   CALCIUM 9.3 10/07/2020 1614   PROT 6.3 03/01/2018 1627   ALBUMIN 3.8 03/01/2018 1627   AST 18 03/01/2018 1627   ALT 23 03/01/2018 1627   ALKPHOS 105 03/01/2018 1627   BILITOT <0.2 03/01/2018 1627   GFRNONAA 48 (L) 10/07/2020 1614   GFRNONAA 59 (L) 05/29/2015 1536   GFRAA 55 (L) 10/07/2020 1614   GFRAA 69 05/29/2015 1536   Lab Results  Component Value Date   CHOL 151 06/20/2017   HDL 42 06/20/2017   LDLCALC 62 06/20/2017   TRIG 236 (H) 06/20/2017   CHOLHDL 3.6 06/20/2017   Lab Results  Component Value Date   HGBA1C 5.7 (H) 10/07/2020   Lab Results  Component Value Date   VITAMINB12 653 09/02/2013   Lab Results  Component  Value Date   TSH 1.850 04/05/2021    12/25/21 CTA head  1.  No acute intracranial process. 2.  No intracranial large vessel occlusion or  significant stenosis.    ASSESSMENT AND PLAN  51 y.o. year old female here with progressive visual loss in the left greater than right eye since past 1 week.  Concern for malignant idiopathic intracranial hypertension.   Dx:  1. Papilledema   2. 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 Other headache syndrome      PLAN:  VISION LOSS / PAPILLEDEMA  -Discussed options for urgent evaluation at the emergency room to get MRI and LP with IV sedation versus expedited outpatient work-up (earliest would be tomorrow).  Due to daily worsening of symptoms with maximum symptoms occurring today, I advised patient to go to the emergency room ASAP for evaluation.  If she is not able to go there tonight, then she can start acetazolamide to 50 mg twice a day tonight and we will attempt to set up stat MRI and LP tomorrow.  - check MRI brain w/wo (stat) - check LP (stat) - check labs - start acetazolamide 250mg  twice a day    Orders Placed This Encounter  Procedures   MR BRAIN W WO CONTRAST   MR ORBITS W WO CONTRAST   DG FL GUIDED LUMBAR PUNCTURE   CBC with Differential/Platelet   Comprehensive metabolic panel   Vitamin 123456   Hemoglobin A1c   TSH   Neuromyelitis optica autoab, IgG   Sedimentation Rate   C-reactive Protein    Meds ordered this encounter  Medications   ALPRAZolam (XANAX) 0.5 MG tablet    Sig: for sedation before MRI scan; take 1 tab 1 hour before scan; may repeat 1 tab 15 min before scan    Dispense:  3 tablet    Refill:  0   acetaZOLAMIDE (DIAMOX) 250 MG tablet    Sig: Take 1 tablet (250 mg total) by mouth 2 (two) times daily.    Dispense:  60 tablet    Refill:  3   No follow-ups on file.  I reviewed images, labs, notes, records myself. I summarized findings and reviewed with patient, for this high risk condition (vision loss, papilledema) requiring high complexity decision making.     Penni Bombard, MD [id number], [id number]  PM Certified in Neurology, Neurophysiology and Neuroimaging  [facility] [address], [place], [place] [address] [phone]

## 2021-12-30 NOTE — Patient Instructions (Signed)
VISION LOSS / PAPILLEDEMA ?- check MRI brain w/wo (stat) ?- check LP (stat) ?- check labs ?- start acetazolamide 250mg  twice a day  ?

## 2021-12-31 ENCOUNTER — Telehealth: Payer: Self-pay | Admitting: Diagnostic Neuroimaging

## 2021-12-31 ENCOUNTER — Telehealth: Payer: Self-pay | Admitting: Neurology

## 2021-12-31 MED ORDER — PROCHLORPERAZINE EDISYLATE 10 MG/2ML IJ SOLN
10.0000 mg | Freq: Once | INTRAMUSCULAR | Status: AC
  Administered 2021-12-31: 10 mg via INTRAMUSCULAR
  Filled 2021-12-31: qty 2

## 2021-12-31 NOTE — ED Provider Notes (Signed)
[facility] EMERGENCY DEPARTMENT Provider Note  CSN: [id number] Arrival date & time: 12/30/21 1841  Chief Complaint(s) Neurologic Problem  HPI [name] is a 51 y.o. female with a past medical history listed below including migraines referred to the emergency department by neurology for work-up of IIH.  Neurology is requesting patient have MRI and lumbar puncture.  They scheduled for expedited work-up for tomorrow but felt patient required more emergent work-up given the progressive vision loss.  They instructed her to take Diamox.  Patient reports that since taking Diamox her vision has significantly improved.  She still having a mild headache.  No focal deficits.  No gait instability.  No other physical complaints.   The history is provided by the patient and medical records.   Past Medical History Past Medical History:  Diagnosis Date   Bipolar 1 disorder (HCC)    Followed by Mental Health.  All psychiatric medications prescribed by Mental Health.  Stable for many years.     Deep venous thrombosis of leg (HCC) 2012   completed year of coumadin    Dyspareunia 03/04/11   Hx of measles    Hypothyroidism    Hypothyroidism since 8th grade.  Managed by Dr. Sharl Ma, Endocrinology.  On synthroid.  No prior thyroid surgery/ablation.    Migraines    Monilia infection 12/31/10   Vaginal atrophy 03/04/11   Patient Active Problem List   Diagnosis Date Noted   Upper back pain 12/02/2021   Candidal intertrigo 01/27/2021   Neck pain on right side 01/06/2021   Breast abscess 10/07/2020   Hip pain, chronic, right 10/17/2019   Right calf pain 08/14/2018   Bronchitis 03/15/2018   Obesity 06/24/2016   Breast cyst 05/27/2016   Acute midline low back pain 01/13/2016   Nummular dermatitis 09/03/2015   Diarrhea 08/12/2014   Migraines 08/12/2014   Vertigo 05/21/2014   Insomnia 03/11/2014   Frequent falls 09/02/2013   Healthcare maintenance 08/16/2013   Tobacco abuse  08/14/2012   Menopausal symptoms 03/16/2012   History of DVT of lower extremity 03/16/2012   HYPERCHOLESTEROLEMIA, PURE 04/10/2007   RHINITIS, ALLERGIC NOS 04/10/2007   GERD 04/10/2007   Hypothyroidism 01/17/2007   Bipolar I disorder, most recent episode depressed (HCC) 01/17/2007   Home Medication(s) Prior to Admission medications   Medication Sig Start Date End Date Taking? Authorizing Provider  acetaminophen (TYLENOL) 500 MG tablet Take 1,500 mg by mouth every 6 (six) hours as needed for moderate pain or headache.   Yes [provider]  acetaZOLAMIDE (DIAMOX) 250 MG tablet Take 1 tablet (250 mg total) by mouth 2 (two) times daily. 12/30/21  Yes Penumalli, Glenford Bayley, MD  albuterol (VENTOLIN HFA) 108 (90 Base) MCG/ACT inhaler Inhale 1-2 puffs into the lungs every 6 (six) hours as needed for wheezing or shortness of breath. 11/10/20  Yes Christian, Rylee, MD  ARIPiprazole (ABILIFY) 30 MG tablet Take 30 mg by mouth at bedtime. 05/20/16  Yes [provider]  buPROPion (WELLBUTRIN XL) 150 MG 24 hr tablet Take 150 mg by mouth daily.  05/21/16  Yes [provider]  busPIRone (BUSPAR) 15 MG tablet Take 15 mg by mouth 4 (four) times daily. 03/28/19  Yes [provider]  clonazePAM (KLONOPIN) 0.5 MG tablet Take 0.5 mg by mouth daily as needed for anxiety. 12/03/21  Yes [provider]  cyclobenzaprine (FLEXERIL) 10 MG tablet Take 1 tablet (10 mg total) by mouth 3 (three) times daily as needed for muscle spasms. Take  one tablet daily as needed. 12/03/21  Yes Reymundo Poll, MD  gabapentin (NEURONTIN) 300 MG capsule Take 1 capsule (300 mg total) by mouth 3 (three) times daily. 10/12/21  Yes Doran Stabler, DO  hydrOXYzine (ATARAX) 25 MG tablet Take 25 mg by mouth in the morning, at noon, in the evening, and at bedtime. 12/19/21  Yes [provider]  levothyroxine (SYNTHROID) 112 MCG tablet Take 1 tablet (112 mcg total) by mouth daily before breakfast. 09/29/21   Yes Katsadouros, Vasilios, MD  meclizine (ANTIVERT) 25 MG tablet TAKE 1 TABLET(25 MG) BY MOUTH TWICE DAILY AS NEEDED FOR DIZZINESS Patient taking differently: Take 25 mg by mouth 2 (two) times daily as needed for dizziness. 06/02/21  Yes Inez Catalina, MD  nystatin (MYCOSTATIN/NYSTOP) powder Apply topically 4 (four) times daily. Patient taking differently: Apply 1 application topically 2 (two) times daily as needed (rash). 01/01/17  Yes Burns, Alexa R, MD  pantoprazole (PROTONIX) 40 MG tablet TAKE 1 TABLET BY MOUTH TWICE DAILY Patient taking differently: Take 40 mg by mouth 2 (two) times daily. TAKE 1 TABLET BY MOUTH TWICE DAILY 10/07/21  Yes Doran Stabler, DO  propranolol (INDERAL) 80 MG tablet TAKE 1 TABLET(80 MG) BY MOUTH THREE TIMES DAILY Patient taking differently: Take 80 mg by mouth 3 (three) times daily. 10/19/21  Yes Doran Stabler, DO  rizatriptan (MAXALT) 5 MG tablet May repeat in 2 hours if needed Patient taking differently: Take 5 mg by mouth as needed for migraine. May repeat in 2 hours if needed 09/02/21  Yes Katsadouros, Vasilios, MD  simvastatin (ZOCOR) 40 MG tablet TAKE 1 TABLET BY MOUTH EVERY DAY AT 6 PM Patient taking differently: Take 40 mg by mouth daily at 6 PM. 06/16/21  Yes Doran Stabler, DO  ALPRAZolam Prudy Feeler) 0.5 MG tablet for sedation before MRI scan; take 1 tab 1 hour before scan; may repeat 1 tab 15 min before scan Patient taking differently: Take 0.5 mg by mouth See admin instructions. for sedation before MRI scan; take 1 tab 1 hour before scan; may repeat 1 tab 15 min before scan 12/30/21   Penumalli, Glenford Bayley, MD  butalbital-acetaminophen-caffeine (FIORICET) 50-325-40 MG tablet TAKE 1 TABLET BY MOUTH EVERY 5 TO 7 DAYS AS NEEDED FOR SEVERE MIGRAINE Patient not taking: Reported on 12/31/2021 04/01/21   Elige Radon, MD  clotrimazole (LOTRIMIN) 1 % cream Apply 1 application topically 2 (two) times daily. Until rash resolves Patient not taking: Reported on 12/31/2021 01/27/21    Guinevere Scarlet A, DO  diclofenac Sodium (VOLTAREN) 1 % GEL Apply 4 g topically 4 (four) times daily. Patient not taking: Reported on 12/31/2021 12/03/21   Reymundo Poll, MD  estrogens, conjugated, (PREMARIN) 0.45 MG tablet Take 1 tablet (0.45 mg total) by mouth daily. Take daily for 21 days then do not take for 7 days. Patient not taking: Reported on 12/30/2021 05/10/21 05/10/22  Federico Flake, MD  fluticasone [facility]) 50 MCG/ACT nasal spray Place 1 spray into both nostrils daily for 14 days. Patient not taking: Reported on 12/31/2021 10/18/21 12/30/21  Doran Stabler, DO  magic mouthwash w/lidocaine SOLN Take 5 mLs by mouth 4 (four) times daily as needed for mouth pain. Patient not taking: Reported on 12/31/2021 08/02/20   Moshe Cipro, NP  metoCLOPramide (REGLAN) 10 MG tablet Take 1 tablet (10 mg total) by mouth 3 (three) times daily as needed for nausea. Patient not taking: Reported on 12/31/2021 10/17/19 12/30/21  Chevis Pretty, MD  traZODone (DESYREL) 100 MG tablet Take 1  tablet (100 mg total) by mouth at bedtime. For sleep Patient not taking: Reported on 12/31/2021 06/28/19   Earl LagosNarendra, Nischal, MD                                                                                                                                    Allergies Codeine, Morphine, Sulfa antibiotics, Sulfonamide derivatives, Meloxicam, Morphine and related, and Naproxen  Review of Systems Review of Systems As noted in HPI  Physical Exam Vital Signs  I have reviewed the triage vital signs BP 103/77 (BP Location: Left Arm)    Pulse 73    Temp 99.8 F (37.7 C) (Oral)    Resp 15    SpO2 98%   Physical Exam Vitals reviewed.  Constitutional:      General: She is not in acute distress.    Appearance: She is well-developed. She is not diaphoretic.  HENT:     Head: Normocephalic and atraumatic.     Right Ear: External ear normal.     Left Ear: External ear normal.     Nose: Nose normal.  Eyes:      General: No scleral icterus.    Conjunctiva/sclera: Conjunctivae normal.  Neck:     Trachea: Phonation normal.  Cardiovascular:     Rate and Rhythm: Normal rate and regular rhythm.  Pulmonary:     Effort: Pulmonary effort is normal. No respiratory distress.     Breath sounds: No stridor.  Abdominal:     General: There is no distension.  Musculoskeletal:        General: Normal range of motion.     Cervical back: Normal range of motion.  Neurological:     Mental Status: She is alert and oriented to person, place, and time.     Comments: Mental Status:  Alert and oriented to person, place, and time.  Attention and concentration normal.  Speech clear.  Recent memory is intact  Cranial Nerves:  II Visual Fields: Intact to confrontation. Visual fields intact. III, IV, VI: Pupils equal and reactive to light and near. Full eye movement without nystagmus  V Facial Sensation: Normal. No weakness of masticatory muscles  VII: No facial weakness or asymmetry  VIII Auditory Acuity: Grossly normal  XI: Normal sternocleidomastoid and trapezius strength     Motor System: Muscle Strength: 5/5 and symmetric in the upper and lower extremities. No pronation or drift.  Muscle Tone: Tone and muscle bulk are normal in the upper and lower extremities.  Reflexes: DTRs: 1+ and symmetrical in all four extremities. No Clonus Coordination: Intact finger-to-nose No tremor.  Sensation: Intact to light touch Gait: Routine gait normal.   Psychiatric:        Behavior: Behavior normal.    ED Results and Treatments Labs (all labs ordered are listed, but only abnormal results are displayed) Labs Reviewed  COMPREHENSIVE METABOLIC PANEL - Abnormal; Notable for the following components:  Result Value   Creatinine, Ser 1.58 (*)    GFR, Estimated 39 (*)    All other components within normal limits  CBC WITH DIFFERENTIAL/PLATELET - Abnormal; Notable for the following components:   WBC 14.8 (*)    Neutro  Abs 9.1 (*)    Lymphs Abs 4.6 (*)    All other components within normal limits  URINALYSIS, ROUTINE W REFLEX MICROSCOPIC - Abnormal; Notable for the following components:   APPearance HAZY (*)    Leukocytes,Ua TRACE (*)    Bacteria, UA MANY (*)    All other components within normal limits                                                                                                                         EKG  EKG Interpretation  Date/Time:    Ventricular Rate:    PR Interval:    QRS Duration:   QT Interval:    QTC Calculation:   R Axis:     Text Interpretation:         Radiology No results found.  Pertinent labs & imaging results that were available during my care of the patient were reviewed by me and considered in my medical decision making (see MDM for details).  Medications Ordered in ED Medications  prochlorperazine (COMPAZINE) injection 10 mg (10 mg Intramuscular Given 12/31/21 0112)                                                                                                                                     Procedures Procedures  (including critical care time)  Medical Decision Making / ED Course    Complexity of Problem:  Co morbidities that complicate the patient evaluation Migraine headaches, prior DVTs, bipolar  Additional history obtained: Neurology clinic note described in HPI  Patient's presenting problem/concern and DDX listed below: Headache and vision loss. Patient has had recent imaging negative for any mass effect. Main concern for neurology is IIH prompting her referral to the emergency department. No infectious symptoms concerning for meningitis.     Complexity of Data:   Cardiac Monitoring: N/A  Laboratory Tests ordered listed below with my independent interpretation: CBC with mild leukocytosis no anemia No significant electrolyte derangements.  Renal function close to her previous baseline.      Imaging Studies ordered  listed below with my independent interpretation: None  ED Course:    Hospitalization Considered:  Yes  Assessment, Intervention, and Reassessment: Headache with vision loss Most concerning for IIH. Given patient's vision improvement, she inquired about waiting till tomorrow to have her imaging and lumbar puncture performed.  Given her improved vision, patient was given the option to have the work-up performed here versus outpatient.  Given the time of night MRI with take several hours.  I informed her that I could attempt lumbar puncture here in the emergency department but if unsuccessful she would have to have an IR guided lumbar puncture which would be performed tomorrow.  With shared decision-making patient opted to wait to have her MRI and lumbar puncture tomorrow. Given Compazine for headache.  Headache improved   Final Clinical Impression(s) / ED Diagnoses Final diagnoses:  Episodic paroxysmal hemicrania, not intractable  Vision disturbance   The patient appears reasonably screened and/or stabilized for discharge and I doubt any other medical condition or other [facility] requiring further screening, evaluation, or treatment in the ED at this time prior to discharge. Safe for discharge with strict return precautions.  Disposition: Discharge  Condition: Good  I have discussed the results, Dx and Tx plan with the patient/family who expressed understanding and agree(s) with the plan. Discharge instructions discussed at length. The patient/family was given strict return precautions who verbalized understanding of the instructions. No further questions at time of discharge.    ED Discharge Orders     None        Follow Up: Suanne MarkerPenumalli, Vikram R, MD [address] [place] [id number] [phone]  Call today to have your MRI and LP performed           This chart was dictated using voice recognition software.  Despite best efforts to proofread,  errors  can occur which can change the documentation meaning.    Nira Connardama, Sundai Probert Eduardo, MD 12/31/21 [phone]

## 2021-12-31 NOTE — Telephone Encounter (Signed)
FYI ? ? ?[facility] tried to reach out to the patient this morning to schedule her MRI's. I also sent Rinaldo Cloud and Jodelle Green a message with [facility] asking to keep trying to reach out to the patient to schedule.   ?They left a voicemail. Also, her LP is scheduled for Tuesday 01/04/22. ? ? ?Medicaid no auth req.  ?

## 2021-12-31 NOTE — Telephone Encounter (Signed)
Late entry: I received an after-hours call message on 12/30/2021 at 7:24 PM regarding patient needing a test done through the emergency room, test has not been ordered.  I attempted to to call patient back but she did not pick up the phone.  I reviewed her chart, she had been triaged in the emergency room.  I also reviewed her office visit from 12/30/2021. ?I reviewed her now completed emergency room visit, she reported improvement in her vision since starting Diamox and lumbar puncture and MRI were deferred, to be pursued as an outpatient. ?

## 2022-01-03 ENCOUNTER — Other Ambulatory Visit: Payer: Self-pay

## 2022-01-03 ENCOUNTER — Ambulatory Visit
Admission: RE | Admit: 2022-01-03 | Discharge: 2022-01-03 | Disposition: A | Discharge status: Home / Self Care | Payer: Medicaid Other | Source: Ambulatory Visit | Attending: Diagnostic Neuroimaging | Admitting: Diagnostic Neuroimaging

## 2022-01-03 DIAGNOSIS — H547 Unspecified visual loss: Secondary | ICD-10-CM

## 2022-01-03 DIAGNOSIS — H4710 Unspecified papilledema: Secondary | ICD-10-CM

## 2022-01-03 DIAGNOSIS — H5709 Other anomalies of pupillary function: Secondary | ICD-10-CM

## 2022-01-03 DIAGNOSIS — G4489 Other headache syndrome: Secondary | ICD-10-CM

## 2022-01-03 LAB — COMPREHENSIVE METABOLIC PANEL
ALT: 14 IU/L (ref 0–32)
AST: 20 IU/L (ref 0–40)
Albumin/Globulin Ratio: 1.6 (ref 1.2–2.2)
Albumin: 4.4 g/dL (ref 3.8–4.9)
Alkaline Phosphatase: 99 IU/L (ref 44–121)
BUN/Creatinine Ratio: 9 (ref 9–23)
BUN: 13 mg/dL (ref 6–24)
Bilirubin Total: 0.3 mg/dL (ref 0.0–1.2)
CO2: 21 mmol/L (ref 20–29)
Calcium: 9.2 mg/dL (ref 8.7–10.2)
Chloride: 106 mmol/L (ref 96–106)
Creatinine, Ser: 1.4 mg/dL — ABNORMAL HIGH (ref 0.57–1.00)
Globulin, Total: 2.8 g/dL (ref 1.5–4.5)
Glucose: 82 mg/dL (ref 70–99)
Potassium: 4.7 mmol/L (ref 3.5–5.2)
Sodium: 146 mmol/L — ABNORMAL HIGH (ref 134–144)
Total Protein: 7.2 g/dL (ref 6.0–8.5)
eGFR: 45 mL/min/{1.73_m2} — ABNORMAL LOW (ref >59)

## 2022-01-03 LAB — CBC WITH DIFFERENTIAL/PLATELET
Basophils Absolute: 0 10*3/uL (ref 0.0–0.2)
Basos: 0 %
EOS (ABSOLUTE): 0.2 10*3/uL (ref 0.0–0.4)
Eos: 2 %
Hematocrit: 41 % (ref 34.0–46.6)
Hemoglobin: 14.2 g/dL (ref 11.1–15.9)
Immature Grans (Abs): 0 10*3/uL (ref 0.0–0.1)
Immature Granulocytes: 0 %
Lymphocytes Absolute: 4.3 10*3/uL — ABNORMAL HIGH (ref 0.7–3.1)
Lymphs: 35 %
MCH: 29.1 pg (ref 26.6–33.0)
MCHC: 34.6 g/dL (ref 31.5–35.7)
MCV: 84 fL (ref 79–97)
Monocytes Absolute: 0.7 10*3/uL (ref 0.1–0.9)
Monocytes: 5 %
Neutrophils Absolute: 7.2 10*3/uL — ABNORMAL HIGH (ref 1.4–7.0)
Neutrophils: 58 %
Platelets: 272 10*3/uL (ref 150–450)
RBC: 4.88 x10E6/uL (ref 3.77–5.28)
RDW: 13.1 % (ref 11.7–15.4)
WBC: 12.4 10*3/uL — ABNORMAL HIGH (ref 3.4–10.8)

## 2022-01-03 LAB — SEDIMENTATION RATE: Sed Rate: 22 mm/hr (ref 0–40)

## 2022-01-03 LAB — NEUROMYELITIS OPTICA AUTOAB, IGG: NMO IgG Autoantibodies: <1.5 U/mL (ref 0.0–3.0)

## 2022-01-03 LAB — C-REACTIVE PROTEIN: CRP: 4 mg/L (ref 0–10)

## 2022-01-03 LAB — HEMOGLOBIN A1C
Est. average glucose Bld gHb Est-mCnc: 123 mg/dL
Hgb A1c MFr Bld: 5.9 % — ABNORMAL HIGH (ref 4.8–5.6)

## 2022-01-03 LAB — TSH: TSH: 5.4 u[IU]/mL — ABNORMAL HIGH (ref 0.450–4.500)

## 2022-01-03 LAB — VITAMIN B12: Vitamin B-12: 391 pg/mL (ref 232–1245)

## 2022-01-03 MED ORDER — GADOBENATE DIMEGLUMINE 529 MG/ML IV SOLN
20.0000 mL | Freq: Once | INTRAVENOUS | Status: AC | PRN
Start: 2022-01-03 — End: 2022-01-03
  Administered 2022-01-03: 20 mL via INTRAVENOUS

## 2022-01-04 ENCOUNTER — Inpatient Hospital Stay: Admission: RE | Admit: 2022-01-04 | Payer: Medicaid Other | Source: Ambulatory Visit

## 2022-01-04 ENCOUNTER — Telehealth: Payer: Self-pay

## 2022-01-04 ENCOUNTER — Telehealth: Payer: Self-pay | Admitting: *Deleted

## 2022-01-04 NOTE — Telephone Encounter (Signed)
-----   Message from Suanne Marker, MD sent at 01/03/2022  5:13 PM EST ----- ?Unremarkable imaging results. Please call patient. Continue current plan. -VRP ?

## 2022-01-04 NOTE — Telephone Encounter (Signed)
Called patient to ask why she had canceled LP today. She stated she thinks she has the flu because she has been vomiting. She stated her eyes are the same. She has been taking diamox as prescribed. I advised that as soon as she is feeling better to call [facility] and reschedule LP. She stated she will, verbalized understanding, appreciation. ? ?

## 2022-01-04 NOTE — Telephone Encounter (Signed)
Contacted pt, informed her both MRI's was unremarkable, no concerns. Advised to continue current plan and call office back with questions as she had none at this time and was appreciative.  ?

## 2022-01-05 ENCOUNTER — Telehealth: Payer: Self-pay | Admitting: Diagnostic Neuroimaging

## 2022-01-05 ENCOUNTER — Telehealth: Payer: Self-pay | Admitting: *Deleted

## 2022-01-05 NOTE — Telephone Encounter (Signed)
Pt states not until today has she had a really bad headache, pt would like Dr Marjory Lies to have something called in for her.  Pt states tylenol has not done anything for her.  Pt is using [id number] ?

## 2022-01-05 NOTE — Telephone Encounter (Signed)
Called patient and advised per Dr Marjory Lies she continue with Tylenol, but sh edoes need LP as soon as possible. She stated she still feels sick, can hardly get out of bed. I advised she call back with any questions, concerns, worsening problems. Patient verbalized understanding, appreciation. ? ?

## 2022-01-05 NOTE — Telephone Encounter (Signed)
Spoke with patient and informed her the labs showed WBC still slightly up. SHe could have underlying infection.  Kidney function is reduced, but stable. TSH is elevated. Dr Marjory Lies advises Follow up PCP for management. She stated she will call PCP today about lab results. Patient verbalized understanding, appreciation. ? ?

## 2022-01-05 NOTE — Telephone Encounter (Signed)
Called patient who stated she took Tylenol ES at 2 pm for headache but got no relief. She is asking if prescription can be called in or what she can take.  She is taking Diamox 250 mg twice daily. Her LP is 01/10/22 at 12:30.  I advised will send ot Dr Marjory Lies and call her back.  Patient verbalized understanding, appreciation. ? ?

## 2022-01-06 ENCOUNTER — Other Ambulatory Visit: Payer: Self-pay | Admitting: Internal Medicine

## 2022-01-06 ENCOUNTER — Ambulatory Visit (INDEPENDENT_AMBULATORY_CARE_PROVIDER_SITE_OTHER): Payer: Medicaid Other | Admitting: Student

## 2022-01-06 DIAGNOSIS — D72829 Elevated white blood cell count, unspecified: Secondary | ICD-10-CM | POA: Diagnosis not present

## 2022-01-06 DIAGNOSIS — H4710 Unspecified papilledema: Secondary | ICD-10-CM | POA: Diagnosis not present

## 2022-01-06 DIAGNOSIS — H47013 Ischemic optic neuropathy, bilateral: Secondary | ICD-10-CM

## 2022-01-06 DIAGNOSIS — R42 Dizziness and giddiness: Secondary | ICD-10-CM

## 2022-01-06 DIAGNOSIS — E039 Hypothyroidism, unspecified: Secondary | ICD-10-CM | POA: Diagnosis not present

## 2022-01-06 HISTORY — DX: Ischemic optic neuropathy, bilateral: H47.013

## 2022-01-06 NOTE — Assessment & Plan Note (Signed)
TSH elevated numerous.  Patient has continued to take levothyroxine.  Suspect this is secondary to he intracranial hypertension and overall inflammation.  Recommend repeating TSH in 4 weeks. ?

## 2022-01-06 NOTE — Progress Notes (Signed)
? ?  CC: ED Follow Up - Headache, Vision Changes ? ?This is a telephone encounter between [name] and [name] on 01/06/2022 for follow up after her ED visit where she was found to have some lab abnormalities. The visit was conducted with the patient located at home and [name] at [facility]. The patient's identity was confirmed using their DOB and current address. The patient has consented to being evaluated through a telephone encounter and understands the associated risks (an examination cannot be done and the patient may need to come in for an appointment) / benefits (allows the patient to remain at home, decreasing exposure to coronavirus). I personally spent 15 minutes on medical discussion.  ? ?HPI: ? ?[name] is a 51 y.o. with PMH as below.  ? ?Please see A&P for assessment of the patient's acute and chronic medical conditions.  ? ?Past Medical History:  ?Diagnosis Date  ? Bipolar 1 disorder (HCC)   ? Followed by Mental Health.  All psychiatric medications prescribed by Mental Health.  Stable for many years.    ? Deep venous thrombosis of leg (HCC) 2012  ? completed year of coumadin   ? Dyspareunia 03/04/11  ? Hx of measles   ? Hypothyroidism   ? Hypothyroidism since 8th grade.  Managed by Dr. Sharl Ma, Endocrinology.  On synthroid.  No prior thyroid surgery/ablation.   ? Migraines   ? Monilia infection 12/31/10  ? Vaginal atrophy 03/04/11  ? ?Review of Systems:   ?Positive for headache, vision changes ?Negative for fever, chills, chest pain, cough, back pain, abdominal pain, changes in urinary habits ? ?Assessment & Plan:  ? ?See Encounters Tab for problem based charting. ? ?Patient discussed with Dr. Lafonda Mosses ? 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 ?

## 2022-01-06 NOTE — Assessment & Plan Note (Addendum)
The past 3 years patient has a baseline leukocytosis however in her emergency department visit was elevated from 11.  She has no other systemic symptoms infection such as fever, chills, sweats.  I do believe that this is secondary to inflammation possibly related to her intracranial hypertension.  We will continue to follow and repeat CBC in 4 weeks when she has her clinic follow-up. ?

## 2022-01-06 NOTE — Assessment & Plan Note (Addendum)
Assessment: ?Patient with recent vision changes and headache, she was recently diagnosed with papilledema. She followed up with neurology who sent the patient to the ED for further workup and evaluation as well as recommendations of LP and MRI. MRI brain w/o contrast unremrakble, MRI orbits w/wo contrast revealed enlarged optic nerve sheaths suspicious for intracranial hypertension.  She was discharged home on acetazolamide and scheduled for outpatient LP with IR. ? ?Laboratory work shows leukocytosis as well as elevation in her TSH.  She still has yet to have her lumbar puncture.  She can start medication changes denies systemic symptoms.  She notes that the headache she is having diffuse abdominal pain.  Similar to migraine she has had in the past and it comes and goes.  At the same period of acetazolamide but will be critical to have the LP as well as opening pressures to determine the overall etiology.  Patient given strict return precautions if any new symptoms or worsening of her current symptoms she is to go to the ER immediately. ? ?Plan: ?-Continue to follow-up with neurology ?-Continue acetazolamide 250 mg twice daily ?-LP planned for this upcoming Monday ?

## 2022-01-06 NOTE — Assessment & Plan Note (Signed)
>>  ASSESSMENT AND PLAN FOR PAPILLEDEMA, BOTH EYES WRITTEN ON 01/06/2022  5:09 PM BY Belva Agee, MD  Assessment: Patient with recent vision changes and headache, she was recently diagnosed with papilledema. She followed up with neurology who sent the patient to the ED for further workup and evaluation as well as recommendations of LP and MRI. MRI brain w/o contrast unremrakble, MRI orbits w/wo contrast revealed enlarged optic nerve sheaths suspicious for intracranial hypertension.  She was discharged home on acetazolamide and scheduled for outpatient LP with IR.  Laboratory work shows leukocytosis as well as elevation in her TSH.  She still has yet to have her lumbar puncture.  She can start medication changes denies systemic symptoms.  She notes that the headache she is having diffuse abdominal pain.  Similar to migraine she has had in the past and it comes and goes.  At the same period of acetazolamide but will be critical to have the LP as well as opening pressures to determine the overall etiology.  Patient given strict return precautions if any new symptoms or worsening of her current symptoms she is to go to the ER immediately.  Plan: -Continue to follow-up with neurology -Continue acetazolamide 250 mg twice daily -LP planned for this upcoming Monday

## 2022-01-07 ENCOUNTER — Other Ambulatory Visit: Payer: Self-pay | Admitting: *Deleted

## 2022-01-07 MED ORDER — PANTOPRAZOLE SODIUM 40 MG PO TBEC: 40.0000 mg | DELAYED_RELEASE_TABLET | Freq: Two times a day (BID) | ORAL | 0 refills | Status: DC

## 2022-01-07 NOTE — Progress Notes (Signed)
Internal Medicine Clinic Attending  Case discussed with Dr. Katsadouros  At the time of the visit.  We reviewed the resident's history and exam and pertinent patient test results.  I agree with the assessment, diagnosis, and plan of care documented in the resident's note.  

## 2022-01-09 ENCOUNTER — Telehealth: Payer: Self-pay | Admitting: Student

## 2022-01-09 NOTE — Telephone Encounter (Signed)
Received message on call pager for [name]. States that earlier today, she noted a blister on her breast that popped and began to bleed. She does not surround redness and tenderness at area of bleeding. Does not note any purulent discharge, induration, masses, or nipple drainage. She denies any fevers, chills, or other systemic symptoms. ? ?States that in January of last year, she had similar symptoms and was found to have an abscess that required imaging at the breast center and antibiotics. ? ?Advised [name] to go to an urgent care or ED for further in person evaluation with physical exam and labs/imaging if necessary. She agrees with plan. ?

## 2022-01-10 ENCOUNTER — Ambulatory Visit
Admission: RE | Admit: 2022-01-10 | Discharge: 2022-01-10 | Disposition: A | Discharge status: Home / Self Care | Payer: Medicaid Other | Source: Ambulatory Visit | Attending: Diagnostic Neuroimaging | Admitting: Diagnostic Neuroimaging

## 2022-01-10 ENCOUNTER — Telehealth: Payer: Self-pay | Admitting: *Deleted

## 2022-01-10 VITALS — BP 132/81 | HR 72

## 2022-01-10 DIAGNOSIS — H5709 Other anomalies of pupillary function: Secondary | ICD-10-CM

## 2022-01-10 DIAGNOSIS — H547 Unspecified visual loss: Secondary | ICD-10-CM

## 2022-01-10 DIAGNOSIS — H4710 Unspecified papilledema: Secondary | ICD-10-CM

## 2022-01-10 NOTE — Telephone Encounter (Signed)
Called patient and informed her Dr Marjory Lies stated her opening pressure in LP is normal. Continue acetazolamide and follow up eye exam asap. She will call [facility] today to schedule appointment. Patient verbalized understanding, appreciation. ?  ?

## 2022-01-10 NOTE — Discharge Instructions (Signed)

## 2022-01-10 NOTE — Progress Notes (Signed)
Blood drawn from pts RAC to be sent off with LP lab work. 1 successful attempt. Pt tolerated well. Gauze and tape applied after.  

## 2022-01-15 LAB — CNS IGG SYNTHESIS RATE, CSF+BLOOD
Albumin Serum: 4.6 g/dL (ref 3.6–5.1)
Albumin, CSF: 29.5 mg/dL (ref 8.0–42.0)
CNS-IgG Synthesis Rate: -0.7 mg/24 h (ref <=3.3)
IgG (Immunoglobin G), Serum: 819 mg/dL (ref 600–1640)
IgG Total CSF: 2.8 mg/dL (ref 0.8–7.7)
IgG-Index: 0.53 (ref <0.70)

## 2022-01-15 LAB — CSF CELL COUNT WITH DIFFERENTIAL
RBC Count, CSF: 9 cells/uL — ABNORMAL HIGH
WBC, CSF: 3 cells/uL (ref 0–5)

## 2022-01-15 LAB — CSF CULTURE W GRAM STAIN
MICRO NUMBER:: 13122144
Result:: NO GROWTH
SPECIMEN QUALITY:: ADEQUATE

## 2022-01-15 LAB — OLIGOCLONAL BANDS, CSF + SERM

## 2022-01-15 LAB — GLUCOSE, CSF: Glucose, CSF: 71 mg/dL (ref 40–80)

## 2022-01-15 LAB — PROTEIN, CSF: Total Protein, CSF: 52 mg/dL — ABNORMAL HIGH (ref 15–45)

## 2022-01-17 ENCOUNTER — Encounter: Payer: Medicaid Other | Admitting: Internal Medicine

## 2022-01-18 ENCOUNTER — Telehealth: Payer: Self-pay

## 2022-01-18 NOTE — Telephone Encounter (Signed)
Return pt's call - stated she cannot come to her appt tomorrow @1415  PM b/c lack of transportation. She would like to change it to a telehealth appt b/c she needs med for her breasts. I will ask front to change her appt to telehealth. ?

## 2022-01-18 NOTE — Telephone Encounter (Signed)
Requesting to speak with a nurse about something, please call pt back.  

## 2022-01-19 ENCOUNTER — Encounter: Payer: Medicaid Other | Admitting: Internal Medicine

## 2022-01-19 ENCOUNTER — Ambulatory Visit (INDEPENDENT_AMBULATORY_CARE_PROVIDER_SITE_OTHER): Payer: Medicaid Other | Admitting: Internal Medicine

## 2022-01-19 DIAGNOSIS — N611 Abscess of the breast and nipple: Secondary | ICD-10-CM

## 2022-01-19 NOTE — Assessment & Plan Note (Signed)
Several incidences of breast cysts and beast abscess over the past several years for which she has been prescribed abx. She last had a right breast abscess on 10/2020 that improved with doxycycline 100 mg BID x 5 days; breast US with cellulitis and bilateral diagnostic mammogram also reassuring. Exam at that time with 1 cm area of erythematous area with minimal induration and no fluctuance, no drainage appreciated on exam. The right breast was diffusely tender but no masses of lymph nodes palatable.  ?Unfortunately, she is unable to come to the office for an in-person visit today. She reports that this feels exactly like that prior incident in 10/2020. She reports three 1.5 cm abscesses/cysts close to the nipple. She reports that one out of the three cysts expressed blood yestrday. Denies any systemic symptoms. Pain is controlled with tylenol. Given similar sxs to prior, she is requesting abx today. Given that we are unable to examine pt, characterize location, determine inflammatory pattern, no systemic symptoms, and slightly improving symptoms with warm compresses, recommend in-person office visit for proper examination and determination if abx are appropriate for her case. Pt is agreeable to this plan, stating that she will make an appt for early next week. Advised to continue with tylenol prn and warm compresses, and to give the clinic a call or go to UC/ED if she notices worsening symptoms or new fevers, chills, N/V/D.  ? ?

## 2022-01-19 NOTE — Patient Instructions (Signed)
Please schedule for an in person visit ASAP ?Continue with warm compress and Tylenol  ?Please call the clinic or go to urgent care or ED if you notice worsening symptoms, fevers, chills, nausea, or vomiting.  ?

## 2022-01-19 NOTE — Progress Notes (Signed)
? ?  CC: breast abscess  ? ?This is a telephone encounter between LASEAN RAHMING and [employer] on 01/19/2022 for stated above. The visit was conducted with the patient located at home and [facility] at [facility]. The patient's identity was confirmed using their DOB and current address. The patient has consented to being evaluated through a telephone encounter and understands the associated risks (an examination cannot be done and the patient may need to come in for an appointment) / benefits (allows the patient to remain at home, decreasing exposure to coronavirus). I personally spent 15 minutes on medical discussion.  ? ?HPI: ? ?Ms.MIKALIA FESSEL is a 51 y.o. with PMH as below.  ? ?Please see A&P for assessment of the patient's acute and chronic medical conditions.  ? ?Past Medical History:  ?Diagnosis Date  ? Bipolar 1 disorder (HCC)   ? Followed by Mental Health.  All psychiatric medications prescribed by Mental Health.  Stable for many years.    ? Deep venous thrombosis of leg (HCC) 2012  ? completed year of coumadin   ? Dyspareunia 03/04/11  ? Hx of measles   ? Hypothyroidism   ? Hypothyroidism since 8th grade.  Managed by Dr. Sharl Ma, Endocrinology.  On synthroid.  No prior thyroid surgery/ablation.   ? Migraines   ? Monilia infection 12/31/10  ? Vaginal atrophy 03/04/11  ? ?Review of Systems:  ROS negative except for what is noted on the assessment and plan ? ? ? ?Assessment & Plan:  ? ?See Encounters Tab for problem based charting. ? ?Patient discussed with Dr. Sol Blazing ? ?Carmel Sacramento, MD ?Internal Medicine Resident, PGY-1 ?Internal Medicine Clinic  ?

## 2022-01-20 ENCOUNTER — Telehealth: Payer: Self-pay | Admitting: *Deleted

## 2022-01-20 NOTE — Telephone Encounter (Signed)
Called patient to check on her. She stated she is doing fine. I informed her per Dr Marjory Lies her LP labs show non-specific findings. could be related to prior infections or inflammation. He advised she continue current meds and eye follow up. He can see her next week to discuss (video or office visit).  ?She has appointment with Eye dr next  Monday.  I asked her to have notes faxed to Dr Marjory Lies, gave her Pod fax #.  We scheduled in office visit next Tues. Patient verbalized understanding, appreciation. ? ?

## 2022-01-21 ENCOUNTER — Telehealth: Payer: Self-pay | Admitting: Internal Medicine

## 2022-01-21 NOTE — Telephone Encounter (Signed)
Patient paged after hours on-call service regarding breast abscess and overall feeling poorly.  States that 3 bumps on her right breast popped and were draining fluid.  She endorses low-grade fevers at 100 ?F.  Denies any chills, nausea, vomiting, chest pain, shortness of breath.  States in the past she has required antibiotic treatment.  She has been using warm compresses and Tylenol at home with minimal relief.  She had a televisit on 3/22 to discuss this issue and unfortunately was unable to come for an in person visit due to lack of transportation.  Discussed the importance of an in person visit to determine whether or not she needs antibiotics.  In the meantime recommend she continue warm compresses and Tylenol and plan for an in person visit Monday morning.  Recommended if anything worsens to call back or report to nearest urgent care or emergency department. ? ?Lerry Liner, DO ?PGY-3 IMTS  ?

## 2022-01-21 NOTE — Progress Notes (Signed)
Internal Medicine Clinic Attending  Case discussed with Dr. Patel  At the time of the visit.  We reviewed the resident's history and exam and pertinent patient test results.  I agree with the assessment, diagnosis, and plan of care documented in the resident's note.  

## 2022-01-25 ENCOUNTER — Telehealth: Payer: Self-pay | Admitting: *Deleted

## 2022-01-25 ENCOUNTER — Ambulatory Visit: Payer: Medicaid Other | Admitting: Diagnostic Neuroimaging

## 2022-01-25 NOTE — Telephone Encounter (Signed)
Called patient and informed her Dr Marjory Lies wants to see her sooner than June, prefers [facility] which will be easier for her as she doesn't drive. I sent her code to activate her my chart, scheduled for soonest and put her on wait list. She will try to see eye dr this week, and have notes faxed to Dr Marjory Lies. She verbalized understanding, appreciation. ? ?

## 2022-01-26 ENCOUNTER — Ambulatory Visit: Payer: Medicaid Other | Admitting: Internal Medicine

## 2022-01-26 VITALS — BP 118/80 | HR 83 | Temp 98.7°F | Ht 65.0 in | Wt 231.7 lb

## 2022-01-26 DIAGNOSIS — Z1159 Encounter for screening for other viral diseases: Secondary | ICD-10-CM

## 2022-01-26 DIAGNOSIS — N611 Abscess of the breast and nipple: Secondary | ICD-10-CM | POA: Diagnosis present

## 2022-01-26 DIAGNOSIS — D72829 Elevated white blood cell count, unspecified: Secondary | ICD-10-CM

## 2022-01-26 DIAGNOSIS — E878 Other disorders of electrolyte and fluid balance, not elsewhere classified: Secondary | ICD-10-CM | POA: Diagnosis not present

## 2022-01-26 DIAGNOSIS — Z872 Personal history of diseases of the skin and subcutaneous tissue: Secondary | ICD-10-CM | POA: Diagnosis not present

## 2022-01-26 DIAGNOSIS — E039 Hypothyroidism, unspecified: Secondary | ICD-10-CM

## 2022-01-26 DIAGNOSIS — Z1231 Encounter for screening mammogram for malignant neoplasm of breast: Secondary | ICD-10-CM

## 2022-01-26 DIAGNOSIS — Z0000 Encounter for general adult medical examination without abnormal findings: Secondary | ICD-10-CM

## 2022-01-26 DIAGNOSIS — N179 Acute kidney failure, unspecified: Secondary | ICD-10-CM | POA: Insufficient documentation

## 2022-01-26 NOTE — Patient Instructions (Addendum)
Thank you, Ms.Aivy B Troublefield for allowing [place] to provide your care today! ? ?Today we discussed: ?Breast- no cysts or abscesses today so you will not need any antibiotics, please let us know if anything changes  ?Labwork- collected today, and we will follow up  ? ?I will call you if any results require any further attention or action.  ? ?I have ordered the following labs for you: ? ? ?Lab Orders    ?     Hepatitis C Ab reflex to Quant PCR    ?     TSH    ?     BMP8+Anion Gap    ?     CBC with Diff     ? ?Should you have any questions or concerns please call the internal medicine clinic at [phone].   ? ? ?Carmel Sacramento, MD  ?[facility]   ? ?

## 2022-01-26 NOTE — Assessment & Plan Note (Addendum)
Has has baseline leukocytosis of around 11 for the past 3 years. Was elevated to 14.8 on blood work 3 weeks ago. She had no other systemic symptoms infection such as fever, chills, sweats; thought to be 2/2 inflammation possibly related to her intracranial hypertension. Continues to be asymptomatic. We will repeat CBC today and follow-up. ? ?Addendum:  ?Resolved leukocytosis, with WBC 10.4 ?

## 2022-01-26 NOTE — Assessment & Plan Note (Addendum)
Hx of well controlled hypothyroidism on synthroid 125 mcg daily. Good medication compliance, and tolerating well. Recent lab work with TSH 5.4 in the setting of possible infection. Pt is completely asymptomatic at this time, and reports feeling overall well. Will repeat TSH at this time, and follow up to determine if medication requires adjustment.  ? ?Addendum: ?TSH wnl 0.8- continue current dose of synthroid without any dose changes.  ?

## 2022-01-26 NOTE — Assessment & Plan Note (Addendum)
Hx of breast cysts and abscess over the past several years requiring abx tx. She last had a right breast abscess on 10/2020 that improved with doxycycline 100 mg BID x 5 days; breast US with cellulitis and bilateral diagnostic mammogram reassuring. Tele-health visit last week due to concerns for similar sxs as prior, reporting three ~1.5 cm cysts close to the nipple for about 1 week. No recent purulent or blood discharge and denied any systemic symptoms or pain- she was advised to follow up for an in person appointment. Today, she reports near resolution of symptoms. On exam, no induration, no fluctuance, no palpated masses, and no axillary lymphadenopathy along bilateral breasts. Reports feeling well and denies any systemic symptoms. Vitals stable and is afebrile. No indication for antibiotics at this time. Will go ahead and order her annual screening mammogram at this time.  ?

## 2022-01-26 NOTE — Progress Notes (Signed)
? ?CC: breast abscess ? ?HPI: ? ?[name] is a 51 y.o. female with a PMHx stated below and presents today for stated above. Please see the Encounters tab for problem-based Assessment & Plan for additional details.  ? ?Past Medical History:  ?Diagnosis Date  ? Bipolar 1 disorder ([place])   ? Followed by Mental Health.  All psychiatric medications prescribed by Mental Health.  Stable for many years.    ? Deep venous thrombosis of leg ([place]) 2012  ? completed year of coumadin   ? Dyspareunia 03/04/11  ? Hx of measles   ? Hypothyroidism   ? Hypothyroidism since 8th grade.  Managed by [name], Endocrinology.  On synthroid.  No prior thyroid surgery/ablation.   ? Migraines   ? Monilia infection 12/31/10  ? Vaginal atrophy 03/04/11  ? ? ?Current Outpatient Medications on File Prior to Visit  ?Medication Sig Dispense Refill  ? acetaminophen (TYLENOL) 500 MG tablet Take 1,500 mg by mouth every 6 (six) hours as needed for moderate pain or headache.    ? acetaZOLAMIDE (DIAMOX) 250 MG tablet Take 1 tablet (250 mg total) by mouth 2 (two) times daily. 60 tablet 3  ? albuterol (VENTOLIN HFA) 108 (90 Base) MCG/ACT inhaler Inhale 1-2 puffs into the lungs every 6 (six) hours as needed for wheezing or shortness of breath. 8 g 2  ? ALPRAZolam (XANAX) 0.5 MG tablet for sedation before MRI scan; take 1 tab 1 hour before scan; may repeat 1 tab 15 min before scan (Patient taking differently: Take 0.5 mg by mouth See admin instructions. for sedation before MRI scan; take 1 tab 1 hour before scan; may repeat 1 tab 15 min before scan) 3 tablet 0  ? ARIPiprazole (ABILIFY) 30 MG tablet Take 30 mg by mouth at bedtime.  2  ? buPROPion (WELLBUTRIN XL) 150 MG 24 hr tablet Take 150 mg by mouth daily.   2  ? busPIRone (BUSPAR) 15 MG tablet Take 15 mg by mouth 4 (four) times daily.    ? butalbital-acetaminophen-caffeine (FIORICET) 50-325-40 MG tablet TAKE 1 TABLET BY MOUTH EVERY 5 TO 7 DAYS AS NEEDED FOR SEVERE MIGRAINE (Patient not taking:  Reported on 12/31/2021) 5 tablet 0  ? clonazePAM (KLONOPIN) 0.5 MG tablet Take 0.5 mg by mouth daily as needed for anxiety.    ? clotrimazole (LOTRIMIN) 1 % cream Apply 1 application topically 2 (two) times daily. Until rash resolves (Patient not taking: Reported on 12/31/2021) 30 g 0  ? cyclobenzaprine (FLEXERIL) 10 MG tablet Take 1 tablet (10 mg total) by mouth 3 (three) times daily as needed for muscle spasms. Take one tablet daily as needed. 30 tablet 0  ? diclofenac Sodium (VOLTAREN) 1 % GEL Apply 4 g topically 4 (four) times daily. (Patient not taking: Reported on 12/31/2021) 350 g 0  ? estrogens, conjugated, (PREMARIN) 0.45 MG tablet Take 1 tablet (0.45 mg total) by mouth daily. Take daily for 21 days then do not take for 7 days. (Patient not taking: Reported on 12/30/2021) 90 tablet 3  ? fluticasone (FLONASE) 50 MCG/ACT nasal spray Place 1 spray into both nostrils daily for 14 days. (Patient not taking: Reported on 12/31/2021) 18.2 mL 2  ? gabapentin (NEURONTIN) 300 MG capsule Take 1 capsule (300 mg total) by mouth 3 (three) times daily. 270 capsule 6  ? hydrOXYzine (ATARAX) 25 MG tablet Take 25 mg by mouth in the morning, at noon, in the evening, and at bedtime.    ? levothyroxine (SYNTHROID)  112 MCG tablet Take 1 tablet (112 mcg total) by mouth daily before breakfast. 90 tablet 1  ? magic mouthwash w/lidocaine SOLN Take 5 mLs by mouth 4 (four) times daily as needed for mouth pain. (Patient not taking: Reported on 12/31/2021) 100 mL 0  ? meclizine (ANTIVERT) 25 MG tablet TAKE 1 TABLET(25 MG) BY MOUTH TWICE DAILY AS NEEDED FOR DIZZINESS 60 tablet 5  ? metoCLOPramide (REGLAN) 10 MG tablet Take 1 tablet (10 mg total) by mouth 3 (three) times daily as needed for nausea. (Patient not taking: Reported on 12/31/2021) 10 tablet 0  ? nystatin (MYCOSTATIN/NYSTOP) powder Apply topically 4 (four) times daily. (Patient taking differently: Apply 1 application topically 2 (two) times daily as needed (rash).) 15 g 0  ? pantoprazole  (PROTONIX) 40 MG tablet Take 1 tablet (40 mg total) by mouth 2 (two) times daily. 180 tablet 0  ? propranolol (INDERAL) 80 MG tablet TAKE 1 TABLET(80 MG) BY MOUTH THREE TIMES DAILY (Patient taking differently: Take 80 mg by mouth 3 (three) times daily.) 270 tablet 1  ? rizatriptan (MAXALT) 5 MG tablet May repeat in 2 hours if needed (Patient taking differently: Take 5 mg by mouth as needed for migraine. May repeat in 2 hours if needed) 12 tablet 0  ? simvastatin (ZOCOR) 40 MG tablet TAKE 1 TABLET BY MOUTH EVERY DAY AT 6 PM (Patient taking differently: Take 40 mg by mouth daily at 6 PM.) 90 tablet 3  ? traZODone (DESYREL) 100 MG tablet Take 1 tablet (100 mg total) by mouth at bedtime. For sleep (Patient not taking: Reported on 12/31/2021) 10 tablet 0  ? ?Current Facility-Administered Medications on File Prior to Visit  ?Medication Dose Route Frequency Provider Last Rate Last Admin  ? betamethasone acetate-betamethasone sodium phosphate (CELESTONE) injection 3 mg  3 mg Intramuscular Once Edrick Kins, DPM      ? ? ?Family History  ?Problem Relation Age of Onset  ? Hypertension Mother   ? Other Father   ? Cancer Maternal Aunt   ? ? ?Social History  ? ?Socioeconomic History  ? Marital status: Legally Separated  ?  Spouse name: Not on file  ? Number of children: 1  ? Years of education: Not on file  ? Highest education level: Bachelor's degree (e.g., BA, AB, BS)  ?Occupational History  ? Not on file  ?Tobacco Use  ? Smoking status: Every Day  ?  Packs/day: 0.10  ?  Types: Cigarettes  ? Smokeless tobacco: Never  ? Tobacco comments:  ?  3 cigs per day   ?Vaping Use  ? Vaping Use: Never used  ?Substance and Sexual Activity  ? Alcohol use: No  ?  Alcohol/week: 0.0 standard drinks  ? Drug use: No  ? Sexual activity: Not on file  ?  Comment: hysterectomy  ?Other Topics Concern  ? Not on file  ?Social History Narrative  ? Drink Mtn Dew, Coke all day  ? ?Social Determinants of Health  ? ?Financial Resource Strain: Not on file   ?Food Insecurity: Not on file  ?Transportation Needs: Not on file  ?Physical Activity: Not on file  ?Stress: Not on file  ?Social Connections: Not on file  ?Intimate Partner Violence: Not on file  ? ? ?Review of Systems: ?ROS negative except for what is noted on the assessment and plan. ? ?Vitals:  ? 01/26/22 1516  ?BP: 118/80  ?Pulse: 83  ?Temp: 98.7 ?F (37.1 ?C)  ?TempSrc: Oral  ?SpO2: 99%  ?Weight: 231  lb 11.2 oz (105.1 kg)  ?Height: 5\' 5"  (1.651 m)  ? ? ?Physical Exam: ?Constitutional: alert, well-appearing, in NAD ?HENT: normocephalic, atraumatic, mucous membranes moist ?Neck: no thyromegaly, lymphadenopathy or TTP ?Breast: no induration, no fluctuance, no palpated masses, and no axillary lymphadenopathy along bilateral breasts.  ?Cardiovascular: RRR, no m/r/g, non-edematous bilateral LE ?Pulmonary/Chest: normal work of breathing on RA ?Abdominal: soft, non-tender to palpation, non-distended ?Neurological: A&O x 3 and follows commands  ?Skin: warm and dry  ? ?Assessment & Plan:  ? ?See Encounters Tab for problem based charting. ? ?Patient discussed with Dr. Daryll Drown ? ?Lajean Manes, MD  ?Internal Medicine Resident, PGY-1 ?[facility]  ?

## 2022-01-26 NOTE — Assessment & Plan Note (Addendum)
Has known CKD with baseline Cr around 1.3, but found to be elevated to 1.6 on lab work ~3 weeks ago, and thought to be prerenal in the setting of poor hydration int he setting of newly diagnosed intracranial hypertension. Continues to be asymptomatic at this time. We will repeat BMP today and follow-up. ? ?Addendum: ?BMP with Cr back to baseline, and stable GFR.  ?

## 2022-01-26 NOTE — Assessment & Plan Note (Addendum)
Hep C screening done  ?Advised to get Zoster vaccine at local pharmacy  ?Defers pap smear to next visit  ?Does not want flu shot today  ?Does not want colonoscopy referral today; states that she will think about it and report back during follow up.  ? ?Addendum: ?HIV and Hep C screening negative  ?

## 2022-01-27 ENCOUNTER — Telehealth: Payer: Self-pay

## 2022-01-27 LAB — CBC WITH DIFFERENTIAL/PLATELET
Basophils Absolute: 0 10*3/uL (ref 0.0–0.2)
Basos: 0 %
EOS (ABSOLUTE): 0.1 10*3/uL (ref 0.0–0.4)
Eos: 1 %
Hematocrit: 42 % (ref 34.0–46.6)
Hemoglobin: 13.9 g/dL (ref 11.1–15.9)
Immature Grans (Abs): 0 10*3/uL (ref 0.0–0.1)
Immature Granulocytes: 0 %
Lymphocytes Absolute: 3.3 10*3/uL — ABNORMAL HIGH (ref 0.7–3.1)
Lymphs: 32 %
MCH: 28 pg (ref 26.6–33.0)
MCHC: 33.1 g/dL (ref 31.5–35.7)
MCV: 85 fL (ref 79–97)
Monocytes Absolute: 0.7 10*3/uL (ref 0.1–0.9)
Monocytes: 7 %
Neutrophils Absolute: 6.2 10*3/uL (ref 1.4–7.0)
Neutrophils: 60 %
Platelets: 243 10*3/uL (ref 150–450)
RBC: 4.97 x10E6/uL (ref 3.77–5.28)
RDW: 13 % (ref 11.7–15.4)
WBC: 10.4 10*3/uL (ref 3.4–10.8)

## 2022-01-27 LAB — BMP8+ANION GAP
Anion Gap: 15 mmol/L (ref 10.0–18.0)
BUN/Creatinine Ratio: 13 (ref 9–23)
BUN: 17 mg/dL (ref 6–24)
CO2: 20 mmol/L (ref 20–29)
Calcium: 9.5 mg/dL (ref 8.7–10.2)
Chloride: 106 mmol/L (ref 96–106)
Creatinine, Ser: 1.27 mg/dL — ABNORMAL HIGH (ref 0.57–1.00)
Glucose: 120 mg/dL — ABNORMAL HIGH (ref 70–99)
Potassium: 4.2 mmol/L (ref 3.5–5.2)
Sodium: 141 mmol/L (ref 134–144)
eGFR: 51 mL/min/{1.73_m2} — ABNORMAL LOW (ref >59)

## 2022-01-27 LAB — HCV AB W REFLEX TO QUANT PCR: HCV Ab: NONREACTIVE

## 2022-01-27 LAB — TSH: TSH: 0.763 u[IU]/mL (ref 0.450–4.500)

## 2022-01-27 LAB — HCV INTERPRETATION

## 2022-01-27 NOTE — Telephone Encounter (Signed)
Patient called regarding her lab results call [phone] ?

## 2022-01-31 ENCOUNTER — Telehealth: Payer: Self-pay

## 2022-01-31 NOTE — Telephone Encounter (Signed)
Pt is requesting a call back .. she stated that more bumps has popped up on her breast since the last OV  .Marland Kitchen I offered an appt to come back in she stated no  .. her next appt with 4/26 ?

## 2022-01-31 NOTE — Telephone Encounter (Signed)
RTC to patient.  States that more bumps have appeared on her breast.  No pain , drainage or itching. Not hot to touch. Has an appointment for 02/23/2022 .  Unable to get transportation to come in sooner.  Would like a call from a doctor if possible when offered,  Patient scheduled for a Tele Health appointment for 2:45 this afternoon. ?

## 2022-02-01 ENCOUNTER — Ambulatory Visit (INDEPENDENT_AMBULATORY_CARE_PROVIDER_SITE_OTHER): Payer: Medicaid Other | Admitting: Internal Medicine

## 2022-02-01 DIAGNOSIS — N611 Abscess of the breast and nipple: Secondary | ICD-10-CM | POA: Diagnosis not present

## 2022-02-01 NOTE — Progress Notes (Signed)
? ?  I connected with  Billey Gosling on 02/02/22 by telephone and verified that I am speaking with the correct person using two identifiers. ?  ?I discussed the limitations of evaluation and management by telemedicine. The patient expressed understanding and agreed to proceed. ? ?CC: Breast abscess ? ?This is a telephone encounter between MAHLANI DIRKSEN and [facility] Payment on 02/02/2022 for recurrent breast abscess. The visit was conducted with the patient located at home and [facility] Payment at [facility]. The patient's identity was confirmed using their DOB and current address. The patient has consented to being evaluated through a telephone encounter and understands the associated risks (an examination cannot be done and the patient may need to come in for an appointment) / benefits (allows the patient to remain at home, decreasing exposure to coronavirus). I personally spent 9 minutes on medical discussion.  ? ?HPI: ? ?Ms.SHAKEENA CANO is a 51 y.o. with PMH as below.  ? ?Please see A&P for assessment of the patient's acute and chronic medical conditions.  ? ?Past Medical History:  ?Diagnosis Date  ? Bipolar 1 disorder ([place])   ? Followed by Mental Health.  All psychiatric medications prescribed by Mental Health.  Stable for many years.    ? Deep venous thrombosis of leg ([place]) 2012  ? completed year of coumadin   ? Dyspareunia 03/04/11  ? Hx of measles   ? Hypothyroidism   ? Hypothyroidism since 8th grade.  Managed by Dr. Buddy Duty, Endocrinology.  On synthroid.  No prior thyroid surgery/ablation.   ? Migraines   ? Monilia infection 12/31/10  ? Vaginal atrophy 03/04/11  ? ?Review of Systems:  see problem based assessment and plan. ? ?Assessment & Plan:  ? ?See Encounters Tab for problem based charting. ? ?Patient discussed with Dr.  Jimmye Norman ? ?

## 2022-02-02 ENCOUNTER — Encounter: Payer: Self-pay | Admitting: Internal Medicine

## 2022-02-02 NOTE — Assessment & Plan Note (Signed)
Patient presents for reevaluation oh her breast abscess. She was seen in the [facility] roughly 2 weeks ago for the same reason. She states that she has had two new lesions develop on the upper middle portion of her right breast. She feels two focal nodules that a roughly the size of a dime. She has some pain without surrounding erythema, edema, drainage/exudate, or rash. She denies any systemic sign of infection. She denies any skin irritation or breakdown. She has 3 different "bras"/undergarments that she rotates throughout the week and states that they are well fitting without chafing. She previously had a MRSA nares pcr performed that was negative for colonization. She has prediabetes and is not immunocompromised. She denies any history of HS and denies lesions under her arms or groin. She was previously treated with short courses of antibiotics in the past for similar lesions.   ? ?Plan: ?- Warm compresses and tylenol for pain  ?- Discussed covering lesions with nonstick bandage to avoid irritation ?- Counseled her regarding sign and symptoms or worsening infection.  ? ?

## 2022-02-02 NOTE — Progress Notes (Signed)
Internal Medicine Clinic Attending  Case discussed with Dr. Coe  At the time of the visit.  We reviewed the resident's history and exam and pertinent patient test results.  I agree with the assessment, diagnosis, and plan of care documented in the resident's note.  

## 2022-02-03 ENCOUNTER — Telehealth: Payer: Medicaid Other | Admitting: Internal Medicine

## 2022-02-15 NOTE — Progress Notes (Signed)
Internal Medicine Clinic Attending  Case discussed with Dr. Patel at the time of the visit.  We reviewed the resident's history and exam and pertinent patient test results.  I agree with the assessment, diagnosis, and plan of care documented in the resident's note.  

## 2022-02-21 ENCOUNTER — Ambulatory Visit (INDEPENDENT_AMBULATORY_CARE_PROVIDER_SITE_OTHER): Payer: Medicaid Other | Admitting: Internal Medicine

## 2022-02-21 ENCOUNTER — Other Ambulatory Visit: Payer: Self-pay | Admitting: Student

## 2022-02-21 ENCOUNTER — Telehealth: Payer: Medicaid Other | Admitting: Diagnostic Neuroimaging

## 2022-02-21 ENCOUNTER — Telehealth: Payer: Self-pay | Admitting: *Deleted

## 2022-02-21 ENCOUNTER — Encounter: Payer: Self-pay | Admitting: Internal Medicine

## 2022-02-21 DIAGNOSIS — J329 Chronic sinusitis, unspecified: Secondary | ICD-10-CM

## 2022-02-21 MED ORDER — FLUTICASONE PROPIONATE 50 MCG/ACT NA SUSP: 1.0000 | Freq: Every day | NASAL | 2 refills | Status: DC

## 2022-02-21 MED ORDER — GUAIFENESIN 100 MG/5ML PO LIQD: 10.0000 mL | Freq: Four times a day (QID) | ORAL | 0 refills | Status: DC | PRN

## 2022-02-21 MED ORDER — MUCINEX FAST-MAX 5-10-200-325 MG PO CAPS: 2.0000 | ORAL_CAPSULE | Freq: Four times a day (QID) | ORAL | 0 refills | Status: DC | PRN

## 2022-02-21 MED ORDER — DM-GUAIFENESIN ER 30-600 MG PO TB12: 1.0000 | ORAL_TABLET | Freq: Two times a day (BID) | ORAL | 0 refills | Status: DC | PRN

## 2022-02-21 MED ORDER — DM-GUAIFENESIN ER 30-600 MG PO TB12: 1.0000 | ORAL_TABLET | Freq: Four times a day (QID) | ORAL | 0 refills | Status: DC | PRN

## 2022-02-21 NOTE — Progress Notes (Signed)
?[facility] Internal Medicine Residency Telephone Encounter ?Continuity Care Appointment ? ?HPI:  ?This telephone encounter was created for Ms. Liston Alba on 02/21/2022 for the following purpose/cc: 1 week of bothersome cough and sinus congestion. ?Please see problem charting for detail assessment and plan.  ? ?Past Medical History:  ?Past Medical History:  ?Diagnosis Date  ? Acute midline low back pain 01/13/2016  ? Bipolar 1 disorder (HCC)   ? Followed by Mental Health.  All psychiatric medications prescribed by Mental Health.  Stable for many years.    ? Deep venous thrombosis of leg (HCC) 2012  ? completed year of coumadin   ? Dyspareunia 03/04/11  ? Healthcare maintenance 08/16/2013  ? Hx of measles   ? Hypothyroidism   ? Hypothyroidism since 8th grade.  Managed by Dr. Sharl Ma, Endocrinology.  On synthroid.  No prior thyroid surgery/ablation.   ? Migraines   ? Monilia infection 12/31/10  ? Vaginal atrophy 03/04/11  ?  ? ?ROS:  ?Negative unless otherwise stated in assessment.  ? ?Assessment / Plan / Recommendations:  ?Sinusitis ?Patient evaluated via telehealth visit regarding concerns of 1 week of sinus congestion and bothersome cough.  She states that on Easter her mom was diagnosed with "walking pneumonia,"which she has been gradually recovering from, and that in the last week the patient herself has developed cough and sinus related symptoms.  She describes her head as feeling like it is stopped up and that her ears feel clogged and this is affecting her hearing.  She has nasal congestion with heavy amounts of white and sticky mucus whenever she blows her nose.  She has a distressing dry cough that is calm some by drinking beverages.  She denies fever, chills, GI upset, body aches, headaches, malaise.  She has not had sore throat or shortness of breath. ? ?She has had no other sick contacts.  Her symptoms have minimally changed over the course of her illness.  She has tried Tylenol, cough drops, Nasacort nasal  spray twice daily for her symptoms with minimal relief.  She has not tried using a Nettie pot or Mucinex, NyQuil, Tylenol PM.  She did try cough drops but did not help her cough.  She has an inhaler but has not tried using this. ? ?Assessment: At this time I am concerned for a viral sinusitis.  ?Plan: I have sent in 2 prescriptions for the patient: Mucinex Fast-Max (phenylephrine-dextromethorphan-guaifenesin-acetaminophen) every 6 hours as needed as well as Flonase nasal spray.  I have counseled her that she will not be able to take additional Tylenol with the Mucinex as it has Tylenol in it already.  She has been instructed to not use any of her other over-the-counter medications at home while using these medications.  I have counseled her to contact our clinic if, by Thursday of this week, she is not feeling any better so that we can assess her in person to which she is in agreement. ? ?As always, pt is advised that if symptoms worsen or new symptoms arise, they should go to an urgent care facility or to to ER for further evaluation.  ? ?Consent and Medical Decision Making:  ?Patient discussed with Dr.  Sol Blazing ?This is a telephone encounter between SONNET RIZOR and Champ Mungo on 02/21/2022 for 1 week of bothersome cough and sinus congestion. The visit was conducted with the patient located at home and Champ Mungo at [facility]. The patient's identity was confihomermed using their DOB and current address. The  patient has consented to being evaluated through a telephone encounter and understands the associated risks (an examination cannot be done and the patient may need to come in for an appointment) / benefits (allows the patient to remain at home, decreasing exposure to coronavirus). I personally spent 25 minutes on medical discussion.   ? ? ?

## 2022-02-21 NOTE — Progress Notes (Signed)
Patient called the on-call number to inform MD that her insurance would not cover the Mucinex prescription that was ordered today by Dr. August Saucer.  Patient was prescribed Mucinex Fast Max for viral sinusitis today.  I inform patient I will send her a different formulation of Mucinex with instructions to page the on-call number again if her insurance does not cover this one.  ?-- Prescribed Mucinex DM 30-600 mg 12-hour tablet, 1 tablet twice daily as needed for sinus symptoms ?--Re-emphasized instructions to go to the urgent care or ER if symptoms do not improve by Thursday or Friday. ?

## 2022-02-21 NOTE — Progress Notes (Addendum)
Patient paged the on-call number again to state that his insurance, Medicaid, still did not cover the new Mucinex prescription.  I called patient's pharmacy and they informed me that patient's insurance will only cover the guaifenesin syrup but none of their name brand or generic Mucinex or NyQuil. Patient informed of plan to send her guaifenesin (Robitussin) syrup. Patient also instructed to check the cost of the over-the-counter cold and cough medications to see which she can afford. ?--Discontinue Mucinex DM ?--Prescribed guaifenesin (Robitussin) 10mg /59mL, take 10 mL every 6 hours as needed for cough and to loosen phlegm, not exceeding 6 doses/24 hours. ?

## 2022-02-21 NOTE — Assessment & Plan Note (Signed)
Patient evaluated via telehealth visit regarding concerns of 1 week of sinus congestion and bothersome cough.  She states that on Easter her mom was diagnosed with "walking pneumonia,"which she has been gradually recovering from, and that in the last week the patient herself has developed cough and sinus related symptoms.  She describes her head as feeling like it is stopped up and that her ears feel clogged and this is affecting her hearing.  She has nasal congestion with heavy amounts of white and sticky mucus whenever she blows her nose.  She has a distressing dry cough that is calm some by drinking beverages.  She denies fever, chills, GI upset, body aches, headaches, malaise.  She has not had sore throat or shortness of breath. ? ?She has had no other sick contacts.  Her symptoms have minimally changed over the course of her illness.  She has tried Tylenol, cough drops, Nasacort nasal spray twice daily for her symptoms with minimal relief.  She has not tried using a Nettie pot or Mucinex, NyQuil, Tylenol PM.  She did try cough drops but did not help her cough.  She has an inhaler but has not tried using this. ? ?Assessment: At this time I am concerned for a viral sinusitis.  ?Plan: I have sent in 2 prescriptions for the patient: Mucinex Fast-Max (phenylephrine-dextromethorphan-guaifenesin-acetaminophen) every 6 hours as needed as well as Flonase nasal spray.  I have counseled her that she will not be able to take additional Tylenol with the Mucinex as it has Tylenol in it already.  She has been instructed to not use any of her other over-the-counter medications at home while using these medications.  I have counseled her to contact our clinic if, by Thursday of this week, she is not feeling any better so that we can assess her in person to which she is in agreement. ?

## 2022-02-21 NOTE — Telephone Encounter (Signed)
Called patient re: my chart VV today; she is not signed up. She stated she had forgotten and is on her way to [facility] for pneumonia.  I advised her I'll call her later this week.  ?Called [facility] and spoke with Janett Billow. 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 They have not seen her since, and she doesn't have FU with them. She stated they will be glad to see her as needed.  ?

## 2022-02-23 ENCOUNTER — Encounter: Payer: Medicaid Other | Admitting: Internal Medicine

## 2022-02-23 NOTE — Progress Notes (Signed)
Internal Medicine Clinic Attending ? ?Case discussed with Dr. Dean  At the time of the visit.  We reviewed the resident?s history and exam and pertinent patient test results.  I agree with the assessment, diagnosis, and plan of care documented in the resident?s note.  ?

## 2022-03-01 ENCOUNTER — Ambulatory Visit (INDEPENDENT_AMBULATORY_CARE_PROVIDER_SITE_OTHER): Payer: Medicaid Other | Admitting: Internal Medicine

## 2022-03-01 ENCOUNTER — Telehealth: Payer: Self-pay | Admitting: *Deleted

## 2022-03-01 DIAGNOSIS — Z720 Tobacco use: Secondary | ICD-10-CM

## 2022-03-01 DIAGNOSIS — H938X3 Other specified disorders of ear, bilateral: Secondary | ICD-10-CM | POA: Diagnosis not present

## 2022-03-01 NOTE — Progress Notes (Signed)
?  [facility] Internal Medicine Residency Telephone Encounter ?Continuity Care Appointment ? ?HPI:  ?This telephone encounter was created for [name] on 03/01/2022 for the following purpose/cc ear fullness.  ? ?Past Medical History:  ?Past Medical History:  ?Diagnosis Date  ? Acute midline low back pain 01/13/2016  ? Bipolar 1 disorder (HCC)   ? Followed by Mental Health.  All psychiatric medications prescribed by Mental Health.  Stable for many years.    ? Bronchitis 03/15/2018  ? Deep venous thrombosis of leg (HCC) 2012  ? completed year of coumadin   ? Dyspareunia 03/04/11  ? Healthcare maintenance 08/16/2013  ? Hx of measles   ? Hypothyroidism   ? Hypothyroidism since 8th grade.  Managed by Dr. Sharl Ma, Endocrinology.  On synthroid.  No prior thyroid surgery/ablation.   ? Migraines   ? Monilia infection 12/31/10  ? RHINITIS, ALLERGIC NOS 04/10/2007  ? Qualifier: Diagnosis of  By: Duke Salvia    ? Vaginal atrophy 03/04/11  ?  ? ?ROS:  ?Positive for ear fullness, difficulty hearing, eye drainage, negative for fever, chills, cough, shortness of breath  ? ?Assessment / Plan / Recommendations:  ?Please see A&P under problem oriented charting for assessment of the patient's acute and chronic medical conditions.  ?As always, pt is advised that if symptoms worsen or new symptoms arise, they should go to an urgent care facility or to to ER for further evaluation.  ? ?Consent and Medical Decision Making:  ?Patient discussed with Dr.  Lafonda Mosses ?This is a telephone encounter between [name] and [name] on 03/01/2022 for ear fullness. The visit was conducted with the patient located at home and [facility]. The patient's identity was confirmed using their DOB and current address. The patient has consented to being evaluated through a telephone encounter and understands the associated risks (an examination cannot be done and the patient may need to come in for an appointment) / benefits (allows the patient  to remain at home, decreasing exposure to coronavirus). I personally spent 10 minutes on medical discussion.   ? ? ?

## 2022-03-01 NOTE — Assessment & Plan Note (Signed)
Patient says that she quit smoking with her recent cold. Has not needed any sort of nicotine replacement or medications to help her do so. Provided encouragement and counseling that if patient needs any assistance we would be happy to provide her support.  ?

## 2022-03-01 NOTE — Telephone Encounter (Signed)
Called from pt - stated her ears are still stopped up. Pt had telehealth appt on 4/24. In-person appt offered to pt ; stated she cannot come in, she has to take care of her mother. Telehealth appt schedule today 5/2 @ 1515Pm w/Dr Darrick Huntsman. ?

## 2022-03-01 NOTE — Assessment & Plan Note (Signed)
L>R, recent viral illness. Described via telehealth evaluation therefore difficult to assess. However patient feels like her hearing is slightly muffled. Recommended conservative management with sudafed, antihistamine, and flonase. If symptoms fail to improve recommend that patient come in for in person exam and can refer to ENT if needed at that time.  ?

## 2022-03-09 NOTE — Progress Notes (Signed)
Internal Medicine Clinic Attending ° °Case discussed with Dr. DeMaio  At the time of the visit.  We reviewed the resident’s history and exam and pertinent patient test results.  I agree with the assessment, diagnosis, and plan of care documented in the resident’s note. ° ° °

## 2022-03-14 ENCOUNTER — Ambulatory Visit: Payer: Medicaid Other

## 2022-03-29 ENCOUNTER — Other Ambulatory Visit: Payer: Self-pay | Admitting: Internal Medicine

## 2022-03-29 DIAGNOSIS — E039 Hypothyroidism, unspecified: Secondary | ICD-10-CM

## 2022-03-29 MED ORDER — LEVOTHYROXINE SODIUM 112 MCG PO TABS: 112.0000 ug | ORAL_TABLET | Freq: Every day | ORAL | 1 refills | Status: DC

## 2022-03-29 NOTE — Telephone Encounter (Signed)
Pt sates her pharmacy was to send a refill request last week for the following medication. Pt states she has not heard from anyone.  levothyroxine (SYNTHROID) 112 MCG tablet  [id number] -  [place], [place] - [address] AT [facility] [address]

## 2022-04-10 ENCOUNTER — Ambulatory Visit
Admission: EM | Admit: 2022-04-10 | Discharge: 2022-04-10 | Disposition: A | Discharge status: Home / Self Care | Payer: Medicaid Other | Attending: Emergency Medicine | Admitting: Emergency Medicine

## 2022-04-10 ENCOUNTER — Other Ambulatory Visit: Payer: Self-pay

## 2022-04-10 ENCOUNTER — Encounter: Payer: Self-pay | Admitting: Emergency Medicine

## 2022-04-10 DIAGNOSIS — R300 Dysuria: Secondary | ICD-10-CM | POA: Diagnosis present

## 2022-04-10 DIAGNOSIS — R1031 Right lower quadrant pain: Secondary | ICD-10-CM | POA: Diagnosis not present

## 2022-04-10 LAB — POCT URINALYSIS DIP (MANUAL ENTRY)
Blood, UA: NEGATIVE
Glucose, UA: NEGATIVE mg/dL
Ketones, POC UA: NEGATIVE mg/dL
Leukocytes, UA: NEGATIVE
Nitrite, UA: NEGATIVE
Protein Ur, POC: 30 mg/dL — AB
Spec Grav, UA: >=1.03 — AB (ref 1.010–1.025)
Urobilinogen, UA: 0.2 E.U./dL
pH, UA: 5.5 (ref 5.0–8.0)

## 2022-04-10 MED ORDER — CEFTRIAXONE SODIUM 1 G IJ SOLR
1.0000 g | Freq: Once | INTRAMUSCULAR | Status: AC
  Administered 2022-04-10: 1 g via INTRAMUSCULAR

## 2022-04-10 MED ORDER — CIPROFLOXACIN HCL 250 MG PO TABS: 250.0000 mg | ORAL_TABLET | Freq: Two times a day (BID) | ORAL | 0 refills | Status: AC

## 2022-04-10 NOTE — ED Provider Notes (Signed)
[facility]    CSN: [id number] Arrival date & time: 04/10/22  1455    HISTORY   Chief Complaint  Patient presents with   Groin Pain   HPI [name] is a 51 y.o. female. Patient complains of pain in her right lower quadrant for the past 3 days that has been getting gradually worse, patient states the pain is also started to radiate to her right lower back.  Patient denies swelling of her right groin or any lesion present.  Patient denies abnormal vaginal discharge, dyspareunia.  Patient states she is also been having burning with urination but denies increased frequency of urination, increased urge to urinate, difficulty initiating urine stream.  Patient states she is also not noticed any urine malodor or blood in her urine.  Patient denies fever, body ache but endorses chills.  Patient states she does not have a gallbladder or appendix.  Patient's urine dip was remarkable for small amount of protein small amount of bilirubin and an elevated specific gravity.  The history is provided by the patient.   Past Medical History:  Diagnosis Date   Acute midline low back pain 01/13/2016   Bipolar 1 disorder ([place])    Followed by Mental Health.  All psychiatric medications prescribed by Mental Health.  Stable for many years.     Bronchitis 03/15/2018   Deep venous thrombosis of leg ([place]) 2012   completed year of coumadin    Dyspareunia 03/04/11   Healthcare maintenance 08/16/2013   Hx of measles    Hypothyroidism    Hypothyroidism since 8th grade.  Managed by Dr. Buddy Duty, Endocrinology.  On synthroid.  No prior thyroid surgery/ablation.    Migraines    Monilia infection 12/31/10   RHINITIS, ALLERGIC NOS 04/10/2007   Qualifier: Diagnosis of  By: Suzie Portela     Vaginal atrophy 03/04/11   Patient Active Problem List   Diagnosis Date Noted   Ear fullness, bilateral 03/01/2022   Acute kidney injury ([place]) 01/26/2022   Papilledema, both eyes 01/06/2022   Upper back pain  12/02/2021   Candidal intertrigo 01/27/2021   Breast abscess 10/07/2020   Hip pain, chronic, right 10/17/2019   Obesity 06/24/2016   Nummular dermatitis 09/03/2015   Migraines 08/12/2014   Vertigo 05/21/2014   Insomnia 03/11/2014   Frequent falls 09/02/2013   Sinusitis 10/09/2012   Tobacco abuse 08/14/2012   Menopausal symptoms 03/16/2012   History of DVT of lower extremity 03/16/2012   HYPERCHOLESTEROLEMIA, PURE 04/10/2007   GERD 04/10/2007   Hypothyroidism 01/17/2007   Bipolar I disorder, most recent episode depressed ([place]) 01/17/2007   Past Surgical History:  Procedure Laterality Date   ABDOMINAL HYSTERECTOMY  2000   APPENDECTOMY     CESAREAN SECTION  1998   CHOLECYSTECTOMY     left elbow     REPLACEMENT TOTAL KNEE  2001   right knee   TONSILLECTOMY     WISDOM TOOTH EXTRACTION     OB History     Gravida  1   Para  1   Term  1   Preterm  0   AB  0   Living  1      SAB  0   IAB  0   Ectopic  0   Multiple  0   Live Births  1          Home Medications    Prior to Admission medications   Medication Sig Start Date End Date Taking? Authorizing Provider  acetaminophen (TYLENOL) 500 MG tablet Take 1,500 mg by mouth every 6 (six) hours as needed for moderate pain or headache.    [provider]  acetaZOLAMIDE (DIAMOX) 250 MG tablet Take 1 tablet (250 mg total) by mouth 2 (two) times daily. 12/30/21   Penumalli, Earlean Polka, MD  albuterol (VENTOLIN HFA) 108 (90 Base) MCG/ACT inhaler Inhale 1-2 puffs into the lungs every 6 (six) hours as needed for wheezing or shortness of breath. 11/10/20   Mitzi Hansen, MD  ALPRAZolam Duanne Moron) 0.5 MG tablet for sedation before MRI scan; take 1 tab 1 hour before scan; may repeat 1 tab 15 min before scan Patient taking differently: Take 0.5 mg by mouth See admin instructions. for sedation before MRI scan; take 1 tab 1 hour before scan; may repeat 1 tab 15 min before scan 12/30/21   Penumalli, Vikram R, MD  ARIPiprazole  (ABILIFY) 30 MG tablet Take 30 mg by mouth at bedtime. 05/20/16   [provider]  buPROPion (WELLBUTRIN XL) 150 MG 24 hr tablet Take 150 mg by mouth daily.  05/21/16   [provider]  busPIRone (BUSPAR) 15 MG tablet Take 15 mg by mouth 4 (four) times daily. 03/28/19   [provider]  clonazePAM (KLONOPIN) 0.5 MG tablet Take 0.5 mg by mouth daily as needed for anxiety. 12/03/21   [provider]  cyclobenzaprine (FLEXERIL) 10 MG tablet Take 1 tablet (10 mg total) by mouth 3 (three) times daily as needed for muscle spasms. Take one tablet daily as needed. 12/03/21   Velna Ochs, MD  diclofenac Sodium (VOLTAREN) 1 % GEL Apply 4 g topically 4 (four) times daily. Patient not taking: Reported on 12/31/2021 12/03/21   Velna Ochs, MD  estrogens, conjugated, (PREMARIN) 0.45 MG tablet Take 1 tablet (0.45 mg total) by mouth daily. Take daily for 21 days then do not take for 7 days. Patient not taking: Reported on 12/30/2021 05/10/21 05/10/22  Caren Macadam, MD  fluticasone [facility]) 50 MCG/ACT nasal spray Place 1 spray into both nostrils daily for 14 days. 02/21/22 03/07/22  Farrel Gordon, DO  gabapentin (NEURONTIN) 300 MG capsule Take 1 capsule (300 mg total) by mouth 3 (three) times daily. 10/12/21   Gaylan Gerold, DO  guaiFENesin (ROBITUSSIN) 100 MG/5ML liquid Take 10 mLs by mouth every 6 (six) hours as needed for cough or to loosen phlegm. 02/21/22   Lacinda Axon, MD  hydrOXYzine (ATARAX) 25 MG tablet Take 25 mg by mouth in the morning, at noon, in the evening, and at bedtime. 12/19/21   [provider]  levothyroxine (SYNTHROID) 112 MCG tablet Take 1 tablet (112 mcg total) by mouth daily before breakfast. 03/29/22   Iona Beard, MD  magic mouthwash w/lidocaine SOLN Take 5 mLs by mouth 4 (four) times daily as needed for mouth pain. Patient not taking: Reported on 12/31/2021 08/02/20   Faustino Congress, NP  meclizine (ANTIVERT) 25 MG tablet TAKE 1  TABLET(25 MG) BY MOUTH TWICE DAILY AS NEEDED FOR DIZZINESS 01/07/22   Lajean Manes, MD  metoCLOPramide (REGLAN) 10 MG tablet Take 1 tablet (10 mg total) by mouth 3 (three) times daily as needed for nausea. Patient not taking: Reported on 12/31/2021 10/17/19 12/30/21  Dewayne Hatch, MD  nystatin (MYCOSTATIN/NYSTOP) powder Apply topically 4 (four) times daily. Patient taking differently: Apply 1 application topically 2 (two) times daily as needed (rash). 01/01/17   Burns, Arloa Koh, MD  pantoprazole (PROTONIX) 40 MG tablet Take 1 tablet (40 mg total) by mouth  2 (two) times daily. 01/07/22   Lajean Manes, MD  propranolol (INDERAL) 80 MG tablet TAKE 1 TABLET(80 MG) BY MOUTH THREE TIMES DAILY Patient taking differently: Take 80 mg by mouth 3 (three) times daily. 10/19/21   Gaylan Gerold, DO  rizatriptan (MAXALT) 5 MG tablet May repeat in 2 hours if needed Patient taking differently: Take 5 mg by mouth as needed for migraine. May repeat in 2 hours if needed 09/02/21   Riesa Pope, MD  simvastatin (ZOCOR) 40 MG tablet TAKE 1 TABLET BY MOUTH EVERY DAY AT 6 PM Patient taking differently: Take 40 mg by mouth daily at 6 PM. 06/16/21   Gaylan Gerold, DO  traZODone (DESYREL) 100 MG tablet Take 1 tablet (100 mg total) by mouth at bedtime. For sleep Patient not taking: Reported on 12/31/2021 06/28/19   Aldine Contes, MD   Family History Family History  Problem Relation Age of Onset   Hypertension Mother    Other Father    Cancer Maternal Aunt    Social History Social History   Tobacco Use   Smoking status: Every Day    Packs/day: 0.10    Types: Cigarettes   Smokeless tobacco: Never   Tobacco comments:    3 cigs per day   Vaping Use   Vaping Use: Never used  Substance Use Topics   Alcohol use: No    Alcohol/week: 0.0 standard drinks of alcohol   Drug use: No   Allergies   Codeine, Morphine, Sulfa antibiotics, Sulfonamide derivatives, Meloxicam, Morphine and related, and  Naproxen  Review of Systems Review of Systems Pertinent findings noted in history of present illness.   Physical Exam Triage Vital Signs ED Triage Vitals  Enc Vitals Group     BP 08/27/21 0827 (!) 147/82     Pulse Rate 08/27/21 0827 72     Resp 08/27/21 0827 18     Temp 08/27/21 0827 98.3 F (36.8 C)     Temp Source 08/27/21 0827 Oral     SpO2 08/27/21 0827 98 %     Weight --      Height --      Head Circumference --      Peak Flow --      Pain Score 08/27/21 0826 5     Pain Loc --      Pain Edu? --      Ex[place]. [place] --   No data found.  Updated Vital Signs BP 111/79 (BP Location: Left Arm)   Pulse 62   Temp 97.7 F (36.5 C) (Oral)   Resp 18   SpO2 98%   Physical Exam Vitals and nursing note reviewed.  Constitutional:      General: She is not in acute distress.    Appearance: Normal appearance. She is not ill-appearing.  HENT:     Head: Normocephalic and atraumatic.  Eyes:     General: Lids are normal.        Right eye: No discharge.        Left eye: No discharge.     Extraocular Movements: Extraocular movements intact.     Conjunctiva/sclera: Conjunctivae normal.     Right eye: Right conjunctiva is not injected.     Left eye: Left conjunctiva is not injected.  Neck:     Trachea: Trachea and phonation normal.  Cardiovascular:     Rate and Rhythm: Normal rate and regular rhythm.     Pulses: Normal pulses.     Heart sounds: Normal  heart sounds. No murmur heard.    No friction rub. No gallop.  Pulmonary:     Effort: Pulmonary effort is normal. No accessory muscle usage, prolonged expiration or respiratory distress.     Breath sounds: Normal breath sounds. No stridor, decreased air movement or transmitted upper airway sounds. No decreased breath sounds, wheezing, rhonchi or rales.  Chest:     Chest wall: No tenderness.  Abdominal:     General: Abdomen is flat. Bowel sounds are normal. There is no distension.     Palpations: Abdomen is soft.     Tenderness:  There is abdominal tenderness in the right lower quadrant. There is no right CVA tenderness, left CVA tenderness, guarding or rebound. Negative signs include Murphy's sign, Rovsing's sign, McBurney's sign, psoas sign and obturator sign.     Hernia: No hernia is present.  Musculoskeletal:        General: Normal range of motion.     Cervical back: Normal range of motion and neck supple. Normal range of motion.  Lymphadenopathy:     Cervical: No cervical adenopathy.  Skin:    General: Skin is warm and dry.     Findings: No erythema or rash.  Neurological:     General: No focal deficit present.     Mental Status: She is alert and oriented to person, place, and time.  Psychiatric:        Mood and Affect: Mood normal.        Behavior: Behavior normal.     Visual Acuity Right Eye Distance:   Left Eye Distance:   Bilateral Distance:    Right Eye Near:   Left Eye Near:    Bilateral Near:     UC Couse / Diagnostics / Procedures:    EKG  Radiology No results found.  Procedures Procedures (including critical care time)  UC Diagnoses / Final Clinical Impressions(s)   I have reviewed the triage vital signs and the nursing notes.  Pertinent labs & imaging results that were available during my care of the patient were reviewed by me and considered in my medical decision making (see chart for details).    Final diagnoses:  Acute right lower quadrant pain  Dysuria   {LMUTIP:27060}  ED Prescriptions     Medication Sig Dispense Auth. Provider   ciprofloxacin (CIPRO) 250 MG tablet Take 1 tablet (250 mg total) by mouth 2 (two) times daily for 3 days. 6 tablet Lynden Oxford Scales, PA-C      PDMP not reviewed this encounter.  Pending results:  Labs Reviewed  POCT URINALYSIS DIP (MANUAL ENTRY) - Abnormal; Notable for the following components:      Result Value   Bilirubin, UA small (*)    Spec Grav, UA >=1.030 (*)    Protein Ur, POC =30 (*)    All other components within  normal limits  URINE CULTURE    Medications Ordered in UC: Medications  cefTRIAXone (ROCEPHIN) injection 1 g (has no administration in time range)    Disposition Upon Discharge:  Condition: stable for discharge home  Patient presented with concern for an acute illness with associated systemic symptoms and significant discomfort requiring urgent management. In my opinion, this is a condition that a prudent lay person (someone who possesses an average knowledge of health and medicine) may potentially expect to result in complications if not addressed urgently such as respiratory distress, impairment of bodily function or dysfunction of bodily organs.   As such, the patient has been  evaluated and assessed, work-up was performed and treatment was provided in alignment with urgent care protocols and evidence based medicine.  Patient/parent/caregiver has been advised that the patient may require follow up for further testing and/or treatment if the symptoms continue in spite of treatment, as clinically indicated and appropriate.  Routine symptom specific, illness specific and/or disease specific instructions were discussed with the patient and/or caregiver at length.  Prevention strategies for avoiding STD exposure were also discussed.  The patient will follow up with their current PCP if and as advised. If the patient does not currently have a PCP we will assist them in obtaining one.   The patient may need specialty follow up if the symptoms continue, in spite of conservative treatment and management, for further workup, evaluation, consultation and treatment as clinically indicated and appropriate.  Patient/parent/caregiver verbalized understanding and agreement of plan as discussed.  All questions were addressed during visit.  Please see discharge instructions below for further details of plan.  Discharge Instructions:   Discharge Instructions      The urinalysis that we performed in the  clinic today was abnormal.  Urine culture will be performed per our protocol.  The result of the urine culture will be available in the next 3 to 5 days and will be posted to your MyChart account.  If there is an abnormal finding, you will be contacted by phone and advised of further treatment recommendations, if any.   You were advised to begin antibiotics today because you are having active symptoms of a urinary tract infection.  It is very important that you take all doses exactly as prescribed.  Incomplete antibiotic therapy can cause worsening urinary tract infection that can become aggressive, reach the level of your kidneys causing kidney infection and possible hospitalization.   If you have not had complete resolution of your symptoms after completing treatment as prescribed, please return to urgent care for repeat evaluation or follow-up with your primary care provider.   Thank you for visiting urgent care today.  I appreciate the opportunity to participate in your care.        This office note has been dictated using Museum/gallery curator.  Unfortunately, and despite my best efforts, this method of dictation can sometimes lead to occasional typographical or grammatical errors.  I apologize in advance if this occurs.

## 2022-04-10 NOTE — Discharge Instructions (Signed)
The urinalysis that we performed in the clinic today was abnormal.  Urine culture will be performed per our protocol.  The result of the urine culture will be available in the next 3 to 5 days and will be posted to your MyChart account.  If there is an abnormal finding, you will be contacted by phone and advised of further treatment recommendations, if any. ?  ?You were advised to begin antibiotics today because you are having active symptoms of a urinary tract infection.  It is very important that you take all doses exactly as prescribed.  Incomplete antibiotic therapy can cause worsening urinary tract infection that can become aggressive, reach the level of your kidneys causing kidney infection and possible hospitalization. ?  ?If you have not had complete resolution of your symptoms after completing treatment as prescribed, please return to urgent care for repeat evaluation or follow-up with your primary care provider. ?  ?Thank you for visiting urgent care today.  I appreciate the opportunity to participate in your care.  ?

## 2022-04-10 NOTE — ED Triage Notes (Signed)
Pt sts right sided groin pain x 3 days worse with urination

## 2022-04-11 ENCOUNTER — Telehealth: Payer: Self-pay | Admitting: *Deleted

## 2022-04-11 NOTE — Telephone Encounter (Addendum)
Patient called in stating she was seen in UC yesterday for UTI. States she was given "shot with antibiotic and pain med in it" and started on Bactrim. States she has had no relief. Rates bladder pain post void 9/10. States she's not ever been in this much pain before. Discussed going to ED, however, no one is available to drive her till after 6 pm and she does not want to call EMS. Urine culture has not yet resulted. Please advise.

## 2022-04-12 LAB — URINE CULTURE

## 2022-04-13 ENCOUNTER — Other Ambulatory Visit: Payer: Self-pay

## 2022-04-13 MED ORDER — PANTOPRAZOLE SODIUM 40 MG PO TBEC
40.0000 mg | DELAYED_RELEASE_TABLET | Freq: Two times a day (BID) | ORAL | 0 refills | Status: DC
Start: 2022-04-13 — End: 2023-03-14

## 2022-04-21 ENCOUNTER — Telehealth: Payer: Self-pay | Admitting: Diagnostic Neuroimaging

## 2022-04-21 NOTE — Telephone Encounter (Signed)
Noted, thank you

## 2022-04-21 NOTE — Telephone Encounter (Signed)
Pt called to inform Provider and RN that she has made an appointment for her eye doctor at [facility] on July 14th.

## 2022-04-24 ENCOUNTER — Encounter: Payer: Self-pay | Admitting: *Deleted

## 2022-04-25 ENCOUNTER — Ambulatory Visit: Payer: Medicaid Other | Admitting: Diagnostic Neuroimaging

## 2022-04-27 ENCOUNTER — Other Ambulatory Visit: Payer: Self-pay | Admitting: Diagnostic Neuroimaging

## 2022-04-28 ENCOUNTER — Other Ambulatory Visit: Payer: Self-pay | Admitting: Student

## 2022-04-28 DIAGNOSIS — I10 Essential (primary) hypertension: Secondary | ICD-10-CM

## 2022-04-28 DIAGNOSIS — G43009 Migraine without aura, not intractable, without status migrainosus: Secondary | ICD-10-CM

## 2022-04-29 ENCOUNTER — Telehealth: Payer: Self-pay

## 2022-04-29 NOTE — Telephone Encounter (Signed)
propranolol (INDERAL) 80 MG tablet, REFILL REQUEST @ [id number] - [place], [place] - [address] AT [facility] [address].

## 2022-04-29 NOTE — Telephone Encounter (Signed)
Prescription for Inderal is waiting for pick up at patient's pharmacy.  Pharmacy to contact patient.

## 2022-05-01 ENCOUNTER — Encounter (HOSPITAL_COMMUNITY): Payer: Self-pay

## 2022-05-01 ENCOUNTER — Emergency Department (HOSPITAL_COMMUNITY)
Admission: EM | Admit: 2022-05-01 | Discharge: 2022-05-01 | Disposition: A | Discharge status: Home / Self Care | Payer: Medicaid Other | Attending: Emergency Medicine | Admitting: Emergency Medicine

## 2022-05-01 ENCOUNTER — Other Ambulatory Visit: Payer: Self-pay

## 2022-05-01 ENCOUNTER — Emergency Department (HOSPITAL_COMMUNITY): Payer: Medicaid Other

## 2022-05-01 DIAGNOSIS — R079 Chest pain, unspecified: Secondary | ICD-10-CM | POA: Insufficient documentation

## 2022-05-01 DIAGNOSIS — Z79899 Other long term (current) drug therapy: Secondary | ICD-10-CM | POA: Diagnosis not present

## 2022-05-01 DIAGNOSIS — R0789 Other chest pain: Secondary | ICD-10-CM

## 2022-05-01 DIAGNOSIS — R000 Tachycardia, unspecified: Secondary | ICD-10-CM | POA: Diagnosis not present

## 2022-05-01 DIAGNOSIS — I10 Essential (primary) hypertension: Secondary | ICD-10-CM | POA: Insufficient documentation

## 2022-05-01 LAB — BASIC METABOLIC PANEL
Anion gap: 12 (ref 5–15)
BUN: 15 mg/dL (ref 6–20)
CO2: 18 mmol/L — ABNORMAL LOW (ref 22–32)
Calcium: 8.6 mg/dL — ABNORMAL LOW (ref 8.9–10.3)
Chloride: 111 mmol/L (ref 98–111)
Creatinine, Ser: 1.4 mg/dL — ABNORMAL HIGH (ref 0.44–1.00)
GFR, Estimated: 45 mL/min — ABNORMAL LOW (ref >60)
Glucose, Bld: 107 mg/dL — ABNORMAL HIGH (ref 70–99)
Potassium: 3.5 mmol/L (ref 3.5–5.1)
Sodium: 141 mmol/L (ref 135–145)

## 2022-05-01 LAB — CBC WITH DIFFERENTIAL/PLATELET
Abs Immature Granulocytes: 0.04 10*3/uL (ref 0.00–0.07)
Basophils Absolute: 0 10*3/uL (ref 0.0–0.1)
Basophils Relative: 0 %
Eosinophils Absolute: 0.1 10*3/uL (ref 0.0–0.5)
Eosinophils Relative: 1 %
HCT: 39.3 % (ref 36.0–46.0)
Hemoglobin: 12.7 g/dL (ref 12.0–15.0)
Immature Granulocytes: 0 %
Lymphocytes Relative: 38 %
Lymphs Abs: 4.5 10*3/uL — ABNORMAL HIGH (ref 0.7–4.0)
MCH: 27.5 pg (ref 26.0–34.0)
MCHC: 32.3 g/dL (ref 30.0–36.0)
MCV: 85.1 fL (ref 80.0–100.0)
Monocytes Absolute: 0.8 10*3/uL (ref 0.1–1.0)
Monocytes Relative: 7 %
Neutro Abs: 6.3 10*3/uL (ref 1.7–7.7)
Neutrophils Relative %: 54 %
Platelets: 203 10*3/uL (ref 150–400)
RBC: 4.62 MIL/uL (ref 3.87–5.11)
RDW: 14.3 % (ref 11.5–15.5)
WBC: 11.8 10*3/uL — ABNORMAL HIGH (ref 4.0–10.5)
nRBC: 0 % (ref 0.0–0.2)

## 2022-05-01 LAB — TROPONIN I (HIGH SENSITIVITY)
Troponin I (High Sensitivity): 12 ng/L (ref <18)
Troponin I (High Sensitivity): 10 ng/L (ref <18)

## 2022-05-01 MED ORDER — METOPROLOL TARTRATE 5 MG/5ML IV SOLN
2.5000 mg | Freq: Once | INTRAVENOUS | Status: AC
  Administered 2022-05-01: 2.5 mg via INTRAVENOUS
  Filled 2022-05-01: qty 5

## 2022-05-01 MED ORDER — LORAZEPAM 2 MG/ML IJ SOLN
1.0000 mg | Freq: Once | INTRAMUSCULAR | Status: AC
Start: 2022-05-01 — End: 2022-05-01
  Administered 2022-05-01: 1 mg via INTRAVENOUS
  Filled 2022-05-01: qty 1

## 2022-05-01 MED ORDER — PROPRANOLOL HCL 80 MG PO TABS
80.0000 mg | ORAL_TABLET | ORAL | Status: AC
Start: 2022-05-01 — End: 2022-05-01
  Administered 2022-05-01: 80 mg via ORAL
  Filled 2022-05-01: qty 1

## 2022-05-01 MED ORDER — LACTATED RINGERS IV SOLN: INTRAVENOUS | Status: DC

## 2022-05-01 NOTE — ED Provider Notes (Signed)
[facility] EMERGENCY DEPARTMENT Provider Note   CSN: [id number] Arrival date & time: 05/01/22  1921     History  Chief Complaint  Patient presents with   Chest Pain    [name] is a 51 y.o. female.  51 year old female with history of anxiety disorder hypertension who presents due to increased heart rate as well and chest pain.  She has had the chest pain for approximately 5 hours and has been constant in nature.  It has not been associated dyspnea or diaphoresis.  No exertional component to this.  Symptoms are described as a tightness and a pressure.  Patient states that she has missed 3 doses of her propranolol.  She only takes 80 mg 3 times daily.  She takes that for anxiety as well as for hypertension.  EMS was called and she was given aspirin and transported here       Home Medications Prior to Admission medications   Medication Sig Start Date End Date Taking? Authorizing Provider  acetaminophen (TYLENOL) 500 MG tablet Take 1,500 mg by mouth every 6 (six) hours as needed for moderate pain or headache.    [provider]  acetaZOLAMIDE (DIAMOX) 250 MG tablet TAKE 1 TABLET(250 MG) BY MOUTH TWICE DAILY 04/27/22   Penumalli, Glenford Bayley, MD  albuterol (VENTOLIN HFA) 108 (90 Base) MCG/ACT inhaler Inhale 1-2 puffs into the lungs every 6 (six) hours as needed for wheezing or shortness of breath. 11/10/20   Elige Radon, MD  ALPRAZolam Prudy Feeler) 0.5 MG tablet for sedation before MRI scan; take 1 tab 1 hour before scan; may repeat 1 tab 15 min before scan Patient taking differently: Take 0.5 mg by mouth See admin instructions. for sedation before MRI scan; take 1 tab 1 hour before scan; may repeat 1 tab 15 min before scan 12/30/21   Penumalli, Vikram R, MD  ARIPiprazole (ABILIFY) 30 MG tablet Take 30 mg by mouth at bedtime. 05/20/16   [provider]  buPROPion (WELLBUTRIN XL) 150 MG 24 hr tablet Take 150 mg by mouth daily.  05/21/16   [provider]  busPIRone (BUSPAR) 15 MG tablet Take 15 mg by mouth 4 (four) times daily. 03/28/19   [provider]  clonazePAM (KLONOPIN) 0.5 MG tablet Take 0.5 mg by mouth daily as needed for anxiety. 12/03/21   [provider]  cyclobenzaprine (FLEXERIL) 10 MG tablet Take 1 tablet (10 mg total) by mouth 3 (three) times daily as needed for muscle spasms. Take one tablet daily as needed. 12/03/21   Reymundo Poll, MD  diclofenac Sodium (VOLTAREN) 1 % GEL Apply 4 g topically 4 (four) times daily. Patient not taking: Reported on 12/31/2021 12/03/21   Reymundo Poll, MD  estrogens, conjugated, (PREMARIN) 0.45 MG tablet Take 1 tablet (0.45 mg total) by mouth daily. Take daily for 21 days then do not take for 7 days. Patient not taking: Reported on 12/30/2021 05/10/21 05/10/22  Federico Flake, MD  fluticasone [facility]) 50 MCG/ACT nasal spray Place 1 spray into both nostrils daily for 14 days. 02/21/22 03/07/22  Champ Mungo, DO  gabapentin (NEURONTIN) 300 MG capsule Take 1 capsule (300 mg total) by mouth 3 (three) times daily. 10/12/21   Doran Stabler, DO  guaiFENesin (ROBITUSSIN) 100 MG/5ML liquid Take 10 mLs by mouth every 6 (six) hours as needed for cough or to loosen phlegm. 02/21/22   Steffanie Rainwater, MD  hydrOXYzine (ATARAX) 25 MG tablet Take 25 mg by mouth in the  morning, at noon, in the evening, and at bedtime. 12/19/21   [provider]  levothyroxine (SYNTHROID) 112 MCG tablet Take 1 tablet (112 mcg total) by mouth daily before breakfast. 03/29/22   Quincy Simmonds, MD  magic mouthwash w/lidocaine SOLN Take 5 mLs by mouth 4 (four) times daily as needed for mouth pain. Patient not taking: Reported on 12/31/2021 08/02/20   Moshe Cipro, NP  meclizine (ANTIVERT) 25 MG tablet TAKE 1 TABLET(25 MG) BY MOUTH TWICE DAILY AS NEEDED FOR DIZZINESS 01/07/22   Carmel Sacramento, MD  metoCLOPramide (REGLAN) 10 MG tablet Take 1 tablet (10 mg total) by mouth 3 (three) times daily as  needed for nausea. Patient not taking: Reported on 12/31/2021 10/17/19 12/30/21  Chevis Pretty, MD  nystatin (MYCOSTATIN/NYSTOP) powder Apply topically 4 (four) times daily. Patient taking differently: Apply 1 application topically 2 (two) times daily as needed (rash). 01/01/17   Burns, Tinnie Gens, MD  pantoprazole (PROTONIX) 40 MG tablet Take 1 tablet (40 mg total) by mouth 2 (two) times daily. 04/13/22   Carmel Sacramento, MD  propranolol (INDERAL) 80 MG tablet TAKE 1 TABLET(80 MG) BY MOUTH THREE TIMES DAILY. 04/28/22   Atway, Derwood Kaplan, DO  rizatriptan (MAXALT) 5 MG tablet May repeat in 2 hours if needed Patient taking differently: Take 5 mg by mouth as needed for migraine. May repeat in 2 hours if needed 09/02/21   Belva Agee, MD  simvastatin (ZOCOR) 40 MG tablet TAKE 1 TABLET BY MOUTH EVERY DAY AT 6 PM Patient taking differently: Take 40 mg by mouth daily at 6 PM. 06/16/21   Doran Stabler, DO  traZODone (DESYREL) 100 MG tablet Take 1 tablet (100 mg total) by mouth at bedtime. For sleep Patient not taking: Reported on 12/31/2021 06/28/19   Earl Lagos, MD      Allergies    Codeine, Morphine, Sulfa antibiotics, Sulfonamide derivatives, Meloxicam, Morphine and related, and Naproxen    Review of Systems   Review of Systems  All other systems reviewed and are negative.   Physical Exam Updated Vital Signs BP 122/83 (BP Location: Right Arm)   Pulse (!) 108   Temp 98.7 F (37.1 C) (Oral)   Resp 11   SpO2 98%  Physical Exam Vitals and nursing note reviewed.  Constitutional:      General: She is not in acute distress.    Appearance: Normal appearance. She is well-developed. She is not toxic-appearing.  HENT:     Head: Normocephalic and atraumatic.  Eyes:     General: Lids are normal.     Conjunctiva/sclera: Conjunctivae normal.     Pupils: Pupils are equal, round, and reactive to light.  Neck:     Thyroid: No thyroid mass.     Trachea: No tracheal deviation.  Cardiovascular:      Rate and Rhythm: Normal rate and regular rhythm.     Heart sounds: Normal heart sounds. No murmur heard.    No gallop.  Pulmonary:     Effort: Pulmonary effort is normal. No respiratory distress.     Breath sounds: Normal breath sounds. No stridor. No decreased breath sounds, wheezing, rhonchi or rales.  Abdominal:     General: There is no distension.     Palpations: Abdomen is soft.     Tenderness: There is no abdominal tenderness. There is no rebound.  Musculoskeletal:        General: No tenderness. Normal range of motion.     Cervical back: Normal range of motion and neck  supple.  Skin:    General: Skin is warm and dry.     Findings: No abrasion or rash.  Neurological:     Mental Status: She is alert and oriented to person, place, and time. Mental status is at baseline.     GCS: GCS eye subscore is 4. GCS verbal subscore is 5. GCS motor subscore is 6.     Cranial Nerves: No cranial nerve deficit.     Sensory: No sensory deficit.     Motor: Motor function is intact.  Psychiatric:        Attention and Perception: Attention normal.        Mood and Affect: Mood is anxious.        Speech: Speech normal.        Behavior: Behavior normal.     ED Results / Procedures / Treatments   Labs (all labs ordered are listed, but only abnormal results are displayed) Labs Reviewed  CBC WITH DIFFERENTIAL/PLATELET  BASIC METABOLIC PANEL  TROPONIN I (HIGH SENSITIVITY)    EKG EKG Interpretation  Date/Time:  Sunday May 01 2022 19:32:51 EDT Ventricular Rate:  107 PR Interval:  162 QRS Duration: 105 QT Interval:  369 QTC Calculation: 493 R Axis:   18 Text Interpretation: Sinus tachycardia Probable anterior infarct, age indeterminate Confirmed by Lorre Nick ([id number]) on 05/01/2022 7:41:47 PM  Radiology No results found.  Procedures Procedures    Medications Ordered in ED Medications  lactated ringers infusion (has no administration in time range)  propranolol (INDERAL)  tablet 80 mg (has no administration in time range)  LORazepam (ATIVAN) injection 1 mg (has no administration in time range)    ED Course/ Medical Decision Making/ A&P                           Medical Decision Making Amount and/or Complexity of Data Reviewed Labs: ordered. Radiology: ordered. ECG/medicine tests: ordered.  Risk Prescription drug management.   Patient's EKG shows mild sinus tachycardia.  Patient is severely anxious at this time.  Was medicated with Ativan as well as Lopressor as well as propanolol.  Patient's troponin after 6 hours was negative.  Considered ACS versus PE feel less likely.  Suspect that her symptoms are likely anxiety related as well as from beta-blocker withdrawal.  Patient was reassessed and feels better at this time.        Final Clinical Impression(s) / ED Diagnoses Final diagnoses:  None    Rx / DC Orders ED Discharge Orders     None         Lorre Nick, MD 05/01/22 2210

## 2022-05-01 NOTE — ED Triage Notes (Addendum)
Pt bib [facility] from home for chest pain and palpitations starting at approx 9am. Pt describes the pain as pressure/tightness. Pt missed propanolol dose this morning. Hr in thr 120's. BP 165/91. All other vss. 324 aspirin given PTA.

## 2022-05-01 NOTE — Discharge Instructions (Signed)
Get your medications refilled and follow-up your doctor as needed

## 2022-05-05 ENCOUNTER — Encounter: Payer: Medicaid Other | Admitting: Student

## 2022-06-01 ENCOUNTER — Telehealth: Payer: Self-pay | Admitting: *Deleted

## 2022-06-01 NOTE — Telephone Encounter (Signed)
Spoke with patient to schedule 2-4 week follow up from ophthalmology appointment. She asked fo rit to be 4 weeks because her mother is in a nursing home. Scheduled for Sept 11. Patient verbalized understanding, appreciation.

## 2022-06-01 NOTE — Telephone Encounter (Signed)
Called patient's mobile to schedule her for follow up in 2-4 weeks; call could not be completed at this time.  Will try later.

## 2022-06-20 ENCOUNTER — Other Ambulatory Visit: Payer: Self-pay | Admitting: Student

## 2022-06-20 DIAGNOSIS — E039 Hypothyroidism, unspecified: Secondary | ICD-10-CM

## 2022-07-11 ENCOUNTER — Ambulatory Visit: Payer: Medicaid Other | Admitting: Diagnostic Neuroimaging

## 2022-08-18 ENCOUNTER — Ambulatory Visit: Payer: Medicaid Other | Admitting: Diagnostic Neuroimaging

## 2022-08-18 ENCOUNTER — Encounter: Payer: Self-pay | Admitting: Diagnostic Neuroimaging

## 2022-08-18 VITALS — BP 121/73 | HR 63 | Ht 65.0 in | Wt 203.6 lb

## 2022-08-18 DIAGNOSIS — H47013 Ischemic optic neuropathy, bilateral: Secondary | ICD-10-CM | POA: Diagnosis not present

## 2022-08-18 MED ORDER — ASPIRIN 81 MG PO TBEC: 81.0000 mg | DELAYED_RELEASE_TABLET | Freq: Every day | ORAL | 12 refills | Status: DC

## 2022-08-18 NOTE — Patient Instructions (Signed)
BILATERAL NON-ARTERITIC ANTERIOR ISCHEMIC OPTIC NEUROPATHY - start aspirin 81mg  daily - continue propranolol and simvastatin - keep BP, sugar and lipids under control (per PCP) - reduce acetazolamide to 250mg  once a day for 1 week, then stop

## 2022-08-18 NOTE — Progress Notes (Signed)
[facility]  PATIENT: [name] DOB: 03/21/1971  REFERRING CLINICIAN: Modena Slater, DO HISTORY FROM: patient  REASON FOR VISIT: follow up   HISTORICAL  CHIEF COMPLAINT:  Chief Complaint  Patient presents with   Papilledema    Rm 7, 4 week FU to ophthalmology apt, sister- Bjorn Loser "vision loss the same since I was here last time"    HISTORY OF PRESENT ILLNESS:   UPDATE (08/18/22, VRP): Since last visit, doing about the same. MRI and LP were unremarkable. Now diagnosed with NAION. No other new issues.  PRIOR HPI (12/30/21): 51 year old female here for evaluation of vision loss and papilledema.  Symptoms started about 1 week ago with gradual and progressive vision changes affecting the left greater than right eye.  Patient went to [facility] eye care was noted to have bilateral papilledema.  Optic nerve pallor noted on the right side.  She was sent to the emergency room for evaluation on 12/24/21.  CTA of the head was unremarkable.  MRI of the brain was attempted but could not be completed due to claustrophobia.  No change in weight or stress lately.  No change in medications except for addition of Klonopin recently.   REVIEW OF SYSTEMS: Full 14 system review of systems performed and negative with exception of: as per HPI  ALLERGIES: Allergies  Allergen Reactions   Codeine Nausea And Vomiting and Rash   Morphine Shortness Of Breath   Sulfa Antibiotics Shortness Of Breath   Sulfonamide Derivatives Shortness Of Breath, Nausea Only and Rash   Meloxicam Nausea Only   Morphine And Related Other (See Comments)    Makes me loopy and hallucinates   Naproxen Nausea Only and Rash    HOME MEDICATIONS: Outpatient Medications Prior to Visit  Medication Sig Dispense Refill   acetaminophen (TYLENOL) 500 MG tablet Take 1,500 mg by mouth every 6 (six) hours as needed for moderate pain or headache.     albuterol (VENTOLIN HFA) 108 (90 Base) MCG/ACT inhaler Inhale 1-2 puffs  into the lungs every 6 (six) hours as needed for wheezing or shortness of breath. 8 g 2   ALPRAZolam (XANAX) 0.5 MG tablet for sedation before MRI scan; take 1 tab 1 hour before scan; may repeat 1 tab 15 min before scan (Patient taking differently: Take 0.5 mg by mouth See admin instructions. for sedation before MRI scan; take 1 tab 1 hour before scan; may repeat 1 tab 15 min before scan) 3 tablet 0   ARIPiprazole (ABILIFY) 30 MG tablet Take 30 mg by mouth at bedtime.  2   buPROPion (WELLBUTRIN XL) 150 MG 24 hr tablet Take 150 mg by mouth daily.   2   busPIRone (BUSPAR) 15 MG tablet Take 15 mg by mouth 4 (four) times daily.     clonazePAM (KLONOPIN) 0.5 MG tablet Take 0.5 mg by mouth daily as needed for anxiety.     cyclobenzaprine (FLEXERIL) 10 MG tablet Take 1 tablet (10 mg total) by mouth 3 (three) times daily as needed for muscle spasms. Take one tablet daily as needed. 30 tablet 0   diclofenac Sodium (VOLTAREN) 1 % GEL Apply 4 g topically 4 (four) times daily. 350 g 0   gabapentin (NEURONTIN) 300 MG capsule Take 1 capsule (300 mg total) by mouth 3 (three) times daily. 270 capsule 6   guaiFENesin (ROBITUSSIN) 100 MG/5ML liquid Take 10 mLs by mouth every 6 (six) hours as needed for cough or to loosen phlegm. 236 mL 0  hydrOXYzine (ATARAX) 25 MG tablet Take 25 mg by mouth in the morning, at noon, in the evening, and at bedtime.     levothyroxine (SYNTHROID) 112 MCG tablet TAKE 1 TABLET(112 MCG) BY MOUTH DAILY BEFORE BREAKFAST 90 tablet 1   magic mouthwash w/lidocaine SOLN Take 5 mLs by mouth 4 (four) times daily as needed for mouth pain. 100 mL 0   meclizine (ANTIVERT) 25 MG tablet TAKE 1 TABLET(25 MG) BY MOUTH TWICE DAILY AS NEEDED FOR DIZZINESS 60 tablet 5   nystatin (MYCOSTATIN/NYSTOP) powder Apply topically 4 (four) times daily. (Patient taking differently: Apply 1 application  topically 2 (two) times daily as needed (rash).) 15 g 0   pantoprazole (PROTONIX) 40 MG tablet Take 1 tablet (40 mg  total) by mouth 2 (two) times daily. 180 tablet 0   propranolol (INDERAL) 80 MG tablet TAKE 1 TABLET(80 MG) BY MOUTH THREE TIMES DAILY. 270 tablet 1   rizatriptan (MAXALT) 5 MG tablet May repeat in 2 hours if needed (Patient taking differently: Take 5 mg by mouth as needed for migraine. May repeat in 2 hours if needed) 12 tablet 0   simvastatin (ZOCOR) 40 MG tablet TAKE 1 TABLET BY MOUTH EVERY DAY AT 6 PM (Patient taking differently: Take 40 mg by mouth daily at 6 PM.) 90 tablet 3   traZODone (DESYREL) 100 MG tablet Take 1 tablet (100 mg total) by mouth at bedtime. For sleep 10 tablet 0   acetaZOLAMIDE (DIAMOX) 250 MG tablet TAKE 1 TABLET(250 MG) BY MOUTH TWICE DAILY 60 tablet 3   estrogens, conjugated, (PREMARIN) 0.45 MG tablet Take 1 tablet (0.45 mg total) by mouth daily. Take daily for 21 days then do not take for 7 days. (Patient not taking: Reported on 12/30/2021) 90 tablet 3   fluticasone (FLONASE) 50 MCG/ACT nasal spray Place 1 spray into both nostrils daily for 14 days. 18.2 mL 2   metoCLOPramide (REGLAN) 10 MG tablet Take 1 tablet (10 mg total) by mouth 3 (three) times daily as needed for nausea. (Patient not taking: Reported on 12/31/2021) 10 tablet 0   No facility-administered medications prior to visit.    PAST MEDICAL HISTORY: Past Medical History:  Diagnosis Date   Acute midline low back pain 01/13/2016   Bipolar 1 disorder (HCC)    Followed by Mental Health.  All psychiatric medications prescribed by Mental Health.  Stable for many years.     Bronchitis 03/15/2018   Deep venous thrombosis of leg (HCC) 2012   completed year of coumadin    Dyspareunia 03/04/11   Healthcare maintenance 08/16/2013   Hx of measles    Hypothyroidism    Hypothyroidism since 8th grade.  Managed by Dr. Sharl Ma, Endocrinology.  On synthroid.  No prior thyroid surgery/ablation.    Migraines    Monilia infection 12/31/10   RHINITIS, ALLERGIC NOS 04/10/2007   Qualifier: Diagnosis of  By: Duke Salvia     Vaginal  atrophy 03/04/11    PAST SURGICAL HISTORY: Past Surgical History:  Procedure Laterality Date   ABDOMINAL HYSTERECTOMY  2000   APPENDECTOMY     CESAREAN SECTION  1998   CHOLECYSTECTOMY     left elbow     REPLACEMENT TOTAL KNEE  2001   right knee   TONSILLECTOMY     WISDOM TOOTH EXTRACTION      FAMILY HISTORY: Family History  Problem Relation Age of Onset   Hypertension Mother    Other Father    Cancer Maternal Aunt  SOCIAL HISTORY: Social History   Socioeconomic History   Marital status: Legally Separated    Spouse name: Not on file   Number of children: 1   Years of education: Not on file   Highest education level: Bachelor's degree (e.g., BA, AB, BS)  Occupational History   Not on file  Tobacco Use   Smoking status: Every Day    Packs/day: 0.10    Types: Cigarettes   Smokeless tobacco: Never   Tobacco comments:    3 cigs per day   Vaping Use   Vaping Use: Never used  Substance and Sexual Activity   Alcohol use: No    Alcohol/week: 0.0 standard drinks of alcohol   Drug use: No   Sexual activity: Not on file    Comment: hysterectomy  Other Topics Concern   Not on file  Social History Narrative   Drink Mtn Dew, Coke all day   Lives with sister   Social Determinants of Health   Financial Resource Strain: Not on file  Food Insecurity: Not on file  Transportation Needs: Not on file  Physical Activity: Not on file  Stress: Not on file  Social Connections: Not on file  Intimate Partner Violence: Not on file     PHYSICAL EXAM  GENERAL EXAM/CONSTITUTIONAL: Vitals:  Vitals:   08/18/22 1449  BP: 121/73  Pulse: 63  Weight: 203 lb 9.6 oz (92.4 kg)  Height: 5\' 5"  (1.651 m)   Body mass index is 33.88 kg/m. Wt Readings from Last 3 Encounters:  08/18/22 203 lb 9.6 oz (92.4 kg)  01/26/22 231 lb 11.2 oz (105.1 kg)  12/30/21 231 lb (104.8 kg)   Patient is in no distress; well developed, nourished and groomed; neck is  supple  CARDIOVASCULAR: Examination of carotid arteries is normal; no carotid bruits Regular rate and rhythm, no murmurs Examination of peripheral vascular system by observation and palpation is normal  EYES: Ophthalmoscopic exam of optic discs and posterior segments is normal; no papilledema or hemorrhages No results found. --> CAN COUNT FINGERS AT 24 INCHES; CANNOT READ ANY LETTERS ON SNELLEN CHART  MUSCULOSKELETAL: Gait, strength, tone, movements noted in Neurologic exam below  NEUROLOGIC: MENTAL STATUS:      No data to display         awake, alert, oriented to person, place and time recent and remote memory intact normal attention and concentration language fluent, comprehension intact, naming intact fund of knowledge appropriate  CRANIAL NERVE:  2nd - OPTIC NERVE PALLOR 2nd, 3rd, 4th, 6th - RIGHT PUPIL 5-->3 SLUGGISH; LEFT MINIMAL REACTION TO DIRECT (5-->4) ;visual fields full to confrontation, extraocular muscles intact, no nystagmus 5th - facial sensation symmetric 7th - facial strength symmetric 8th - hearing intact 9th - palate elevates symmetrically, uvula midline 11th - shoulder shrug symmetric 12th - tongue protrusion midline  MOTOR:  normal bulk and tone, full strength in the BUE, BLE  SENSORY:  normal and symmetric to light touch, temperature, vibration  COORDINATION:  finger-nose-finger, fine finger movements normal  REFLEXES:  deep tendon reflexes TRACE and symmetric  GAIT/STATION:  narrow based gait     DIAGNOSTIC DATA (LABS, IMAGING, TESTING) - I reviewed patient records, labs, notes, testing and imaging myself where available.  Lab Results  Component Value Date   WBC 11.8 (H) 05/01/2022   HGB 12.7 05/01/2022   HCT 39.3 05/01/2022   MCV 85.1 05/01/2022   PLT 203 05/01/2022      Component Value Date/Time   NA 141 05/01/2022  1950   NA 141 01/26/2022 1601   K 3.5 05/01/2022 1950   CL 111 05/01/2022 1950   CO2 18 (L) 05/01/2022  1950   GLUCOSE 107 (H) 05/01/2022 1950   BUN 15 05/01/2022 1950   BUN 17 01/26/2022 1601   CREATININE 1.40 (H) 05/01/2022 1950   CREATININE 1.12 (H) 05/29/2015 1536   CALCIUM 8.6 (L) 05/01/2022 1950   PROT 7.1 12/30/2021 2004   PROT 7.2 12/30/2021 1620   ALBUMIN 4.6 01/10/2022 1339   AST 22 12/30/2021 2004   ALT 18 12/30/2021 2004   ALKPHOS 80 12/30/2021 2004   BILITOT 0.5 12/30/2021 2004   BILITOT 0.3 12/30/2021 1620   GFRNONAA 45 (L) 05/01/2022 1950   GFRNONAA 59 (L) 05/29/2015 1536   GFRAA 55 (L) 10/07/2020 1614   GFRAA 69 05/29/2015 1536   Lab Results  Component Value Date   CHOL 151 06/20/2017   HDL 42 06/20/2017   LDLCALC 62 06/20/2017   TRIG 236 (H) 06/20/2017   CHOLHDL 3.6 06/20/2017   Lab Results  Component Value Date   HGBA1C 5.9 (H) 12/30/2021   Lab Results  Component Value Date   VITAMINB12 391 12/30/2021   Lab Results  Component Value Date   TSH 0.763 01/26/2022    12/25/21 CTA head  1.  No acute intracranial process. 2.  No intracranial large vessel occlusion or significant stenosis.  01/03/22 MRI brain / orbits - Unremarkable MRI brain (with and without). No acute findings. - Enlarged optic nerve sheaths.  Findings are nonspecific but can be seen in the setting of idiopathic intracranial hypertension.   01/10/22 LP 1. Normal opening pressure of 15 cm H2O. 2. Successful L3-L4 lumbar puncture with collection of 8.5 mL clear CSF.    ASSESSMENT AND PLAN  51 y.o. year old female here with sudden visual loss in the left greater than right eye since Feb 2023.   Dx:  1. 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 Swallow whole.    Dispense:  30 tablet    Refill:  12   Return for return to PCP, pending if symptoms worsen or fail to improve.     , MD 08/18/2022, 3:19 PM Certified in Neurology, Neurophysiology and Neuroimaging  [facility] [address], [address] [place], [address] [place] [id number]

## 2022-08-29 ENCOUNTER — Telehealth: Payer: Self-pay | Admitting: Diagnostic Neuroimaging

## 2022-08-29 NOTE — Telephone Encounter (Signed)
Pt is calling. Stated she would like for [facility] to give her a call. Stated she is having a really bad headache the effecting her right eye. Pt is requesting a call from nurse.

## 2022-08-29 NOTE — Telephone Encounter (Signed)
Called patient who stated she's been having bad left-sided headaches. She's taken rizatriptan in past for migraines, but she's not getting  relief with it now. Having flashes in her eye, seeing eye dr tomorrow. She stopped taking ASA due to stomach issues, has tapered off acetazolamide, and she stated her headaches began after she stopped it. I advised will send to Dr Leta Baptist an let her know recommendations. Patient verbalized understanding, appreciation.

## 2022-08-30 ENCOUNTER — Other Ambulatory Visit: Payer: Self-pay

## 2022-08-30 MED ORDER — SIMVASTATIN 40 MG PO TABS: ORAL_TABLET | ORAL | 3 refills | Status: DC

## 2022-08-30 NOTE — Telephone Encounter (Signed)
simvastatin (ZOCOR) 40 MG tablet, refill request @  [facility] [place].

## 2022-08-31 MED ORDER — TOPIRAMATE 50 MG PO TABS: 50.0000 mg | ORAL_TABLET | Freq: Two times a day (BID) | ORAL | 3 refills | Status: DC

## 2022-08-31 NOTE — Telephone Encounter (Signed)
Called patient, LVM-  advised her of new Rx for her headaches. Advised she call for any questions, problems and let us know if anything concerning found at eye dr apt.

## 2022-08-31 NOTE — Telephone Encounter (Signed)
Start topiramate 50mg  twice a day for migraine prevention.   Meds ordered this encounter  Medications   topiramate (TOPAMAX) 50 MG tablet    Sig: Take 1 tablet (50 mg total) by mouth 2 (two) times daily.    Dispense:  60 tablet    Refill:  Wagner, MD 87/03/8114, 7:26 PM Certified in Neurology, Neurophysiology and Neuroimaging  [facility] [address], [place], [place] [address] [phone]

## 2022-08-31 NOTE — Addendum Note (Signed)
Addended by: Andrey Spearman R on: 08/31/2022 02:55 PM   Modules accepted: Orders

## 2022-09-27 ENCOUNTER — Other Ambulatory Visit: Payer: Self-pay

## 2022-09-27 DIAGNOSIS — E039 Hypothyroidism, unspecified: Secondary | ICD-10-CM

## 2022-09-28 MED ORDER — LEVOTHYROXINE SODIUM 112 MCG PO TABS: ORAL_TABLET | ORAL | 1 refills | Status: DC

## 2022-09-29 ENCOUNTER — Encounter: Payer: Self-pay | Admitting: Student

## 2022-11-01 ENCOUNTER — Encounter: Payer: Self-pay | Admitting: Student

## 2022-11-01 ENCOUNTER — Ambulatory Visit: Payer: Medicaid Other | Admitting: Student

## 2022-11-01 ENCOUNTER — Other Ambulatory Visit: Payer: Self-pay

## 2022-11-01 VITALS — BP 125/80 | HR 71 | Temp 98.7°F | Ht 65.0 in | Wt 206.0 lb

## 2022-11-01 DIAGNOSIS — F3130 Bipolar disorder, current episode depressed, mild or moderate severity, unspecified: Secondary | ICD-10-CM

## 2022-11-01 DIAGNOSIS — I10 Essential (primary) hypertension: Secondary | ICD-10-CM | POA: Diagnosis not present

## 2022-11-01 DIAGNOSIS — F514 Sleep terrors [night terrors]: Secondary | ICD-10-CM | POA: Insufficient documentation

## 2022-11-01 DIAGNOSIS — G43009 Migraine without aura, not intractable, without status migrainosus: Secondary | ICD-10-CM

## 2022-11-01 DIAGNOSIS — E039 Hypothyroidism, unspecified: Secondary | ICD-10-CM | POA: Diagnosis not present

## 2022-11-01 DIAGNOSIS — F17210 Nicotine dependence, cigarettes, uncomplicated: Secondary | ICD-10-CM

## 2022-11-01 MED ORDER — PROPRANOLOL HCL 80 MG PO TABS: 80.0000 mg | ORAL_TABLET | Freq: Three times a day (TID) | ORAL | 1 refills | Status: DC

## 2022-11-01 MED ORDER — HYDROXYZINE HCL 50 MG PO TABS: 50.0000 mg | ORAL_TABLET | Freq: Four times a day (QID) | ORAL | 0 refills | Status: DC | PRN

## 2022-11-01 MED ORDER — PRAZOSIN HCL 1 MG PO CAPS: 1.0000 mg | ORAL_CAPSULE | Freq: Every day | ORAL | 0 refills | Status: DC

## 2022-11-01 MED ORDER — LEVOTHYROXINE SODIUM 112 MCG PO TABS: ORAL_TABLET | ORAL | 1 refills | Status: DC

## 2022-11-01 MED ORDER — GABAPENTIN 300 MG PO CAPS: 300.0000 mg | ORAL_CAPSULE | Freq: Three times a day (TID) | ORAL | 6 refills | Status: DC

## 2022-11-01 NOTE — Patient Instructions (Signed)
Thank you, [name] for allowing [place] to provide your care today. Today we discussed.  Night terrors Please continue your chronic medications for your anxiety and depression. We will increase your hydroxyzine to 50 mg 4x a day and add prazosin 1mg  nightly. Please continue to take the trazodone nightly  Please contact [facility] to make an appointment with them as soon as possible.   Hypothyroidism   Please continue to take your levothryoxine, we will check your TSH today  I have ordered the following labs for you:   Lab Orders         TSH      Referrals ordered today:   Referral Orders  No referral(s) requested today     I have ordered the following medication/changed the following medications:   Stop the following medications: Medications Discontinued During This Encounter  Medication Reason   ALPRAZolam (XANAX) 0.5 MG tablet    clonazePAM (KLONOPIN) 0.5 MG tablet    gabapentin (NEURONTIN) 300 MG capsule Reorder   hydrOXYzine (ATARAX) 25 MG tablet Reorder   propranolol (INDERAL) 80 MG tablet Reorder   levothyroxine (SYNTHROID) 112 MCG tablet Reorder     Start the following medications: Meds ordered this encounter  Medications   gabapentin (NEURONTIN) 300 MG capsule    Sig: Take 1 capsule (300 mg total) by mouth 3 (three) times daily.    Dispense:  270 capsule    Refill:  6   propranolol (INDERAL) 80 MG tablet    Sig: Take 1 tablet (80 mg total) by mouth 3 (three) times daily.    Dispense:  270 tablet    Refill:  1   levothyroxine (SYNTHROID) 112 MCG tablet    Sig: TAKE 1 TABLET(112 MCG) BY MOUTH DAILY BEFORE BREAKFAST    Dispense:  90 tablet    Refill:  1   prazosin (MINIPRESS) 1 MG capsule    Sig: Take 1 capsule (1 mg total) by mouth at bedtime.    Dispense:  30 capsule    Refill:  0   hydrOXYzine (ATARAX) 50 MG tablet    Sig: Take 1 tablet (50 mg total) by mouth every 6 (six) hours as needed.    Dispense:  30 tablet    Refill:  0     Follow up:  1  month as needed    Should you have any questions or concerns please call the internal medicine clinic at [phone].    Sanjuana Letters, D.O. [place]

## 2022-11-01 NOTE — Assessment & Plan Note (Signed)
Continues to follow with [facility] for her mental health. Current regimen of abilify 30 mg qhs, wellbutrin 150 mg XL, buspar 15 mg, trazodone, and hydrozysine. With her increased anxiety throughout the day will increase hydroxyzine to 50 mg q4h PRN. Instructed patient to call and follow up with [facility].

## 2022-11-01 NOTE — Assessment & Plan Note (Addendum)
For the past few weeks patient has had episodes of night terrors with episodes of waking up screaming, diffuse sweating, and thoughts of her mother we recently passed away. She has been unable to sleep and is currently living with family members who are being affected by these episodes. She has had similar episodes many years ago and did well with nortriptyline.   With her recent loss and multiple centrally acting medications, would avoid restarting nortriptyline and trial prazasin 1 mg. Discussed with her if no improvement after 1 week to call clinic and discuss increasing dose. She has also been taking trazodone at night to help with sleep but has stopped taking this because it makes her drowsy. Recommended halving the pill to take 50 mg dose.   -prazosin 1 mg nightly, half usual 100 mg trazodone qhs  -follow up with [facility] outpatient for continued medication management and counseling

## 2022-11-01 NOTE — Assessment & Plan Note (Signed)
Hx of hypothyroidism, currently on levothyroxine. Will refill levothyroxine and check TSH levels today.   Addendum: TSH levels within goal called and discussed with patient

## 2022-11-01 NOTE — Progress Notes (Signed)
CC: Night terrors, Anxiety/depression  HPI:  [name] is a 52 y.o. female living with a history stated below and presents today for follow up for her recent episodes of night terrors. Please see problem based assessment and plan for additional details.  Past Medical History:  Diagnosis Date   Acute midline low back pain 01/13/2016   Bipolar 1 disorder ([place])    Followed by Mental Health.  All psychiatric medications prescribed by Mental Health.  Stable for many years.     Bronchitis 03/15/2018   Deep venous thrombosis of leg ([place]) 2012   completed year of coumadin    Dyspareunia 03/04/11   Healthcare maintenance 08/16/2013   Hx of measles    Hypothyroidism    Hypothyroidism since 8th grade.  Managed by Dr. Buddy Duty, Endocrinology.  On synthroid.  No prior thyroid surgery/ablation.    Migraines    Monilia infection 12/31/10   RHINITIS, ALLERGIC NOS 04/10/2007   Qualifier: Diagnosis of  By: Suzie Portela     Vaginal atrophy 03/04/11    Current Outpatient Medications on File Prior to Visit  Medication Sig Dispense Refill   acetaminophen (TYLENOL) 500 MG tablet Take 1,500 mg by mouth every 6 (six) hours as needed for moderate pain or headache.     albuterol (VENTOLIN HFA) 108 (90 Base) MCG/ACT inhaler Inhale 1-2 puffs into the lungs every 6 (six) hours as needed for wheezing or shortness of breath. 8 g 2   ARIPiprazole (ABILIFY) 30 MG tablet Take 30 mg by mouth at bedtime.  2   aspirin EC 81 MG tablet Take 1 tablet (81 mg total) by mouth daily. Swallow whole. 30 tablet 12   buPROPion (WELLBUTRIN XL) 150 MG 24 hr tablet Take 150 mg by mouth daily.   2   busPIRone (BUSPAR) 15 MG tablet Take 15 mg by mouth 4 (four) times daily.     cyclobenzaprine (FLEXERIL) 10 MG tablet Take 1 tablet (10 mg total) by mouth 3 (three) times daily as needed for muscle spasms. Take one tablet daily as needed. 30 tablet 0   diclofenac Sodium (VOLTAREN) 1 % GEL Apply 4 g topically 4 (four) times daily. 350 g  0   estrogens, conjugated, (PREMARIN) 0.45 MG tablet Take 1 tablet (0.45 mg total) by mouth daily. Take daily for 21 days then do not take for 7 days. (Patient not taking: Reported on 12/30/2021) 90 tablet 3   fluticasone (FLONASE) 50 MCG/ACT nasal spray Place 1 spray into both nostrils daily for 14 days. 18.2 mL 2   guaiFENesin (ROBITUSSIN) 100 MG/5ML liquid Take 10 mLs by mouth every 6 (six) hours as needed for cough or to loosen phlegm. 236 mL 0   magic mouthwash w/lidocaine SOLN Take 5 mLs by mouth 4 (four) times daily as needed for mouth pain. 100 mL 0   meclizine (ANTIVERT) 25 MG tablet TAKE 1 TABLET(25 MG) BY MOUTH TWICE DAILY AS NEEDED FOR DIZZINESS 60 tablet 5   metoCLOPramide (REGLAN) 10 MG tablet Take 1 tablet (10 mg total) by mouth 3 (three) times daily as needed for nausea. (Patient not taking: Reported on 12/31/2021) 10 tablet 0   nystatin (MYCOSTATIN/NYSTOP) powder Apply topically 4 (four) times daily. (Patient taking differently: Apply 1 application  topically 2 (two) times daily as needed (rash).) 15 g 0   pantoprazole (PROTONIX) 40 MG tablet Take 1 tablet (40 mg total) by mouth 2 (two) times daily. 180 tablet 0   rizatriptan (MAXALT) 5 MG tablet May  repeat in 2 hours if needed (Patient taking differently: Take 5 mg by mouth as needed for migraine. May repeat in 2 hours if needed) 12 tablet 0   simvastatin (ZOCOR) 40 MG tablet TAKE 1 TABLET BY MOUTH EVERY DAY AT 6 PM 90 tablet 3   topiramate (TOPAMAX) 50 MG tablet Take 1 tablet (50 mg total) by mouth 2 (two) times daily. 60 tablet 3   traZODone (DESYREL) 100 MG tablet Take 1 tablet (100 mg total) by mouth at bedtime. For sleep 10 tablet 0   No current facility-administered medications on file prior to visit.    Family History  Problem Relation Age of Onset   Hypertension Mother    Other Father    Cancer Maternal Aunt     Social History   Socioeconomic History   Marital status: Legally Separated    Spouse name: Not on file    Number of children: 1   Years of education: Not on file   Highest education level: Bachelor's degree (e.g., BA, AB, BS)  Occupational History   Not on file  Tobacco Use   Smoking status: Every Day    Packs/day: 0.10    Types: Cigarettes   Smokeless tobacco: Never   Tobacco comments:    3 cigs per day   Vaping Use   Vaping Use: Never used  Substance and Sexual Activity   Alcohol use: No    Alcohol/week: 0.0 standard drinks of alcohol   Drug use: No   Sexual activity: Not on file    Comment: hysterectomy  Other Topics Concern   Not on file  Social History Narrative   Drink Mtn Dew, Coke all day   Lives with sister   Social Determinants of Health   Financial Resource Strain: Low Risk  (11/01/2022)   Overall Financial Resource Strain (CARDIA)    Difficulty of Paying Living Expenses: Not hard at all  Food Insecurity: No Food Insecurity (11/01/2022)   Hunger Vital Sign    Worried About Running Out of Food in the Last Year: Never true    Ran Out of Food in the Last Year: Never true  Transportation Needs: No Transportation Needs (11/01/2022)   PRAPARE - Hydrologist (Medical): No    Lack of Transportation (Non-Medical): No  Physical Activity: Not on file  Stress: Not on file  Social Connections: Moderately Integrated (11/01/2022)   Social Connection and Isolation Panel [NHANES]    Frequency of Communication with Friends and Family: More than three times a week    Frequency of Social Gatherings with Friends and Family: More than three times a week    Attends Religious Services: More than 4 times per year    Active Member of [employer] or Organizations: Yes    Attends Archivist Meetings: More than 4 times per year    Marital Status: Separated  Intimate Partner Violence: Not At Risk (11/01/2022)   Humiliation, Afraid, Rape, and Kick questionnaire    Fear of Current or Ex-Partner: No    Emotionally Abused: No    Physically Abused: No    Sexually  Abused: No    Review of Systems: ROS negative except for what is noted on the assessment and plan.  Vitals:   11/01/22 1508  BP: 125/80  Pulse: 71  Temp: 98.7 F (37.1 C)  TempSrc: Oral  SpO2: 99%  Weight: 206 lb (93.4 kg)  Height: 5\' 5"  (1.651 m)    Physical Exam: Constitutional:  Mild distress HENT: normocephalic atraumatic, mucous membranes moist Eyes: conjunctiva non-erythematous Pulmonary/Chest: normal work of breathing on room air Neurological: alert & oriented x 3 Psych: tearful   Assessment & Plan:   Night terrors, adult For the past few weeks patient has had episodes of night terrors with episodes of waking up screaming, diffuse sweating, and thoughts of her mother we recently passed away. She has been unable to sleep and is currently living with family members who are being affected by these episodes. She has had similar episodes many years ago and did well with nortriptyline.   With her recent loss and multiple centrally acting medications, would avoid restarting nortriptyline and trial prazasin 1 mg. Discussed with her if no improvement after 1 week to call clinic and discuss increasing dose. She has also been taking trazodone at night to help with sleep but has stopped taking this because it makes her drowsy. Recommended halving the pill to take 50 mg dose.   -prazosin 1 mg nightly, half usual 100 mg trazodone qhs  -follow up with [facility] for continued medication management and counseling  Hypothyroidism Hx of hypothyroidism, currently on levothyroxine. Will refill levothyroxine and check TSH levels today.   Bipolar I disorder, most recent episode depressed ([facility]) Continues to follow with [facility] for her mental health. Current regimen of abilify 30 mg qhs, wellbutrin 150 mg XL, buspar 15 mg, trazodone, and hydrozysine. With her increased anxiety throughout the day will increase hydroxyzine to 50 mg q4h PRN. Instructed patient to call and follow up with  [facility].   Patient discussed with Dr. Dario Ave, D.O. [facility] Internal Medicine, PGY-3 Phone: [phone] Date 11/01/2022 Time 5:26 PM

## 2022-11-02 LAB — TSH: TSH: 3.11 u[IU]/mL (ref 0.450–4.500)

## 2022-11-03 NOTE — Progress Notes (Signed)
Internal Medicine Clinic Attending  Case discussed with the resident at the time of the visit.  We reviewed the resident's history and exam and pertinent patient test results.  I agree with the assessment, diagnosis, and plan of care documented in the resident's note.  

## 2022-12-05 ENCOUNTER — Encounter: Payer: Medicaid Other | Admitting: Student

## 2023-02-13 ENCOUNTER — Telehealth: Payer: Self-pay

## 2023-02-13 ENCOUNTER — Other Ambulatory Visit: Payer: Self-pay

## 2023-02-13 DIAGNOSIS — G43009 Migraine without aura, not intractable, without status migrainosus: Secondary | ICD-10-CM

## 2023-02-13 DIAGNOSIS — M545 Low back pain, unspecified: Secondary | ICD-10-CM

## 2023-02-13 MED ORDER — CYCLOBENZAPRINE HCL 10 MG PO TABS: 10.0000 mg | ORAL_TABLET | Freq: Three times a day (TID) | ORAL | 0 refills | Status: DC | PRN

## 2023-02-13 MED ORDER — RIZATRIPTAN BENZOATE 5 MG PO TABS: ORAL_TABLET | ORAL | 0 refills | Status: DC

## 2023-02-13 NOTE — Telephone Encounter (Signed)
Incoming fax from pharmacy Message to prescriber "missing/illegible information rx-sig code:please put a complete sig."

## 2023-02-14 ENCOUNTER — Other Ambulatory Visit: Payer: Self-pay | Admitting: *Deleted

## 2023-02-14 DIAGNOSIS — G43009 Migraine without aura, not intractable, without status migrainosus: Secondary | ICD-10-CM

## 2023-02-14 MED ORDER — RIZATRIPTAN BENZOATE 5 MG PO TABS: ORAL_TABLET | ORAL | 0 refills | Status: DC

## 2023-02-14 NOTE — Telephone Encounter (Signed)
Patient called requesting someone to call [facility] in [place] to give instructions on how she is to take her migraine medication.

## 2023-02-19 ENCOUNTER — Other Ambulatory Visit: Payer: Self-pay | Admitting: Student

## 2023-02-19 DIAGNOSIS — E039 Hypothyroidism, unspecified: Secondary | ICD-10-CM

## 2023-02-22 ENCOUNTER — Telehealth: Payer: Self-pay | Admitting: Student

## 2023-02-22 DIAGNOSIS — R42 Dizziness and giddiness: Secondary | ICD-10-CM

## 2023-02-22 MED ORDER — MECLIZINE HCL 25 MG PO TABS: ORAL_TABLET | ORAL | 0 refills | Status: DC

## 2023-02-22 NOTE — Telephone Encounter (Signed)
Received on-call page for [name] for acute onset dizziness. Reports she and her sister were walking a few hours ago when she had some mild dizziness and lightheadedness without fall. She mentions that she feels like she is a little off balance, but her symptoms have steadily improved since onset. She has been eating and drinking normally. States she has been on meclizine for similar symptoms previously and is requesting a refill today. Patient is not experiencing any focal extremity weakness/sensation loss, facial weakness, slurred speech, fevers/chills, dyspnea.   She subjectively reports she had a stroke previously but I do not see record of this in her chart. We did discuss coming to the hospital for stroke rule-out tonight, however she declined this. Given her symptoms are improving since onset will send in short course of meclizine this evening. Discussed with her that if her symptoms persist in the morning she should come to the ED. She verbalized understanding.   - Meclizine  #10 tablets sent - Encourage PO intake  - Return precautions given - Follow-up with PCP  Evlyn Kanner, MD Internal Medicine PGY-3 Pager: [phone]

## 2023-02-28 ENCOUNTER — Other Ambulatory Visit: Payer: Self-pay

## 2023-02-28 DIAGNOSIS — R42 Dizziness and giddiness: Secondary | ICD-10-CM

## 2023-03-01 MED ORDER — HYDROXYZINE HCL 50 MG PO TABS
50.0000 mg | ORAL_TABLET | Freq: Four times a day (QID) | ORAL | 0 refills | Status: DC | PRN
Start: 2023-03-01 — End: 2023-05-28

## 2023-03-01 MED ORDER — MECLIZINE HCL 25 MG PO TABS: ORAL_TABLET | ORAL | 0 refills | Status: DC

## 2023-03-14 ENCOUNTER — Encounter: Payer: Self-pay | Admitting: Student

## 2023-03-14 ENCOUNTER — Ambulatory Visit (INDEPENDENT_AMBULATORY_CARE_PROVIDER_SITE_OTHER): Payer: Medicaid Other | Admitting: Student

## 2023-03-14 DIAGNOSIS — H8110 Benign paroxysmal vertigo, unspecified ear: Secondary | ICD-10-CM

## 2023-03-14 DIAGNOSIS — M549 Dorsalgia, unspecified: Secondary | ICD-10-CM | POA: Insufficient documentation

## 2023-03-14 DIAGNOSIS — R42 Dizziness and giddiness: Secondary | ICD-10-CM

## 2023-03-14 HISTORY — DX: Dorsalgia, unspecified: M54.9

## 2023-03-14 MED ORDER — MECLIZINE HCL 25 MG PO TABS: ORAL_TABLET | ORAL | 0 refills | Status: DC

## 2023-03-14 NOTE — Progress Notes (Signed)
CC: Vertigo   This is a telephone encounter between [name] and [name] on 03/14/2023 for Concerns of vertigo. The visit was conducted with the patient located at home and [name] at [facility]. The patient's identity was confirmed using their DOB and current address. The patient has consented to being evaluated through a telephone encounter and understands the associated risks (an examination cannot be done and the patient may need to come in for an appointment) / benefits (allows the patient to remain at home, decreasing exposure to coronavirus). I personally spent 15 minutes on medical discussion.   HPI:  [name] is a 52 y.o. with PMH as below.   This is a 52 year old female with a past medical history of hypothyroid, vertigo, GERD calling for refill on her meclizine.   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  Patient did not have a fall.  She stated that she has been eating and drinking normally.  Patient states she has been on meclizine in the past for this.  She states she wanted a refill.  She denies any weakness/sensation loss, weakness, slurred speech at that time.  Patient was offered ED visit, patient denied.  Patient was given meclizine at the time.  Patient is requesting medicine today.  Bipolar disorder: Patient remains on Abilify 30 mg nightly, Wellbutrin 150 mg, BuSpar 15 mg, trazodone, hydroxyzine. Insomnia: Trazodone Migraines: Topiramate 50 mg twice daily, rizatriptan 5 mg Hyperlipidemia: Simvastatin 40 mg daily Night terrors: Prazosin 1 mg nightly Hypothyroidism: Levothyroxine daily   Most recent lab work: 05/01/2022 BMP showing bicarb 18, anion gap 12, creatinine 1.4, sodium 141, potassium 3.5. CBC showed white count 11.8, hemoglobin 12.7, MCV 85.1, platelets 203  Please see A&P for assessment of the patient's acute and chronic medical  conditions.   Past Medical History:  Diagnosis Date   Acute midline low back pain 01/13/2016   Bipolar 1 disorder ([facility])    Followed by Mental Health.  All psychiatric medications prescribed by Mental Health.  Stable for many years.     Bronchitis 03/15/2018   Deep venous thrombosis of leg ([facility]) 2012   completed year of coumadin    Dyspareunia 03/04/11   Healthcare maintenance 08/16/2013   Hx of measles    Hypothyroidism    Hypothyroidism since 8th grade.  Managed by Dr. Sharl Ma, Endocrinology.  On synthroid.  No prior thyroid surgery/ablation.    Migraines    Monilia infection 12/31/10   RHINITIS, ALLERGIC NOS 04/10/2007   Qualifier: Diagnosis of  By: Duke Salvia     Vaginal atrophy 03/04/11   Review of Systems:    Neuro: Patient endorses dizziness   Assessment & Plan:   BPPV (benign paroxysmal positional vertigo) Patient has past medical history of vertigo, and has intermittent episodes, in which she takes meclizine.  Patient states she has recently started having vertigo again, that started on 02/22/2023 which at the time patient called the on-call resident who wrote the patient meclizine. Patient states this has helped, and states she would like a refill on this today.  Upon further history, patient reports that at the time, the on-call resident recommend patient to go to the emergency department, but patient did not go.  Patient states she went to urgent care 1-1/2 weeks ago for the same concerns in which then patient was referred for head CT scan in [place] [facility].  Unable to view records.  Patient states that she did not have any strokes and patient was discharged.  Patient states that she has not still had vertigo, and now has run out of meclizine.  She denies any motor loss, sensation loss, headache, loss of speech, trouble swallowing, facial droop, or any other neurological deficits.  Patient states she feels like the room is spinning. She states that these episodes last 10 mins and  occur about 3-4 times a week. She notes that she has not been losing balance and these episodes are brought on by when she is walking or when she is standing up. She notes that she has normal blood pressures during this episodes. She denies any fainting episodes.  She states in the past she has had in her ear looked at by ENT for this but she had no answers.  Unable to locate ENT records.  This could be BPPV as I have less suspicion for central cause given history. Patient can benefit from vestibular therapy, and meclizine.  Plan: -Refill Meclizine -Patient instructed to try Epley maneuver  -Referral for vestibular rehab    Patient discussed with Dr. Oren Bracket, DO  Internal Medicine Resident

## 2023-03-14 NOTE — Assessment & Plan Note (Addendum)
Patient has past medical history of vertigo, and has intermittent episodes, in which she takes meclizine.  Patient states she has recently started having vertigo again, that started on 02/22/2023 which at the time patient called the on-call resident who wrote the patient meclizine. Patient states this has helped, and states she would like a refill on this today.  Upon further history, patient reports that at the time, the on-call resident recommend patient to go to the emergency department, but patient did not go.  Patient states she went to urgent care 1-1/2 weeks ago for the same concerns in which then patient was referred for head CT scan in [place] [facility].  Unable to view records.  Patient states that she did not have any strokes and patient was discharged.  Patient states that she has not still had vertigo, and now has run out of meclizine.  She denies any motor loss, sensation loss, headache, loss of speech, trouble swallowing, facial droop, or any other neurological deficits.  Patient states she feels like the room is spinning. She states that these episodes last 10 mins and occur about 3-4 times a week. She notes that she has not been losing balance and these episodes are brought on by when she is walking or when she is standing up. She notes that she has normal blood pressures during this episodes. She denies any fainting episodes.  She states in the past she has had in her ear looked at by ENT for this but she had no answers.  Unable to locate ENT records.  This could be BPPV as I have less suspicion for central cause given history. Patient can benefit from vestibular therapy, and meclizine.  Plan: -Refill Meclizine -Patient instructed to try Epley maneuver  -Referral for vestibular rehab

## 2023-03-14 NOTE — Assessment & Plan Note (Deleted)
Patient endorses lower back pain.  Patient notes that she has had back surgery in the past, and has had cement put in her back.  I am unable to locate records, and patient is requesting cyclobenzaprine.  Patient has had this in the past on a as needed basis.  She states that since.  Patient also reports she has been trying Tylenol.  She denies trying.  She states the pain is but she would like her as needed cyclobenzaprine.  She denies any fevers, loss of function.  She denies any injury to the area.  Plan: -

## 2023-03-14 NOTE — Progress Notes (Deleted)
11/01/2022 52 year old female with PMH of hypothyroidism, bipolar 1, HLD, CKD 3A, and migraines

## 2023-03-15 NOTE — Progress Notes (Signed)
Internal Medicine Clinic Attending  Case discussed with Dr. Patel  At the time of the visit.  We reviewed the resident's history and exam and pertinent patient test results.  I agree with the assessment, diagnosis, and plan of care documented in the resident's note.  

## 2023-03-28 ENCOUNTER — Encounter: Payer: Medicaid Other | Admitting: Student

## 2023-03-28 ENCOUNTER — Encounter: Payer: Self-pay | Admitting: Student

## 2023-03-28 DIAGNOSIS — N183 Chronic kidney disease, stage 3 unspecified: Secondary | ICD-10-CM

## 2023-03-28 HISTORY — DX: Chronic kidney disease, stage 3 unspecified: N18.30

## 2023-03-28 NOTE — Progress Notes (Deleted)
CC: ***  HPI:  [name] is a 52 y.o. female with a past medical history of migraines, GERD, hypothyroidism, night terrors, insomnia who presents for routine checkup.  Night terrors: Abilify 30 mg nightly, Wellbutrin 150 mg daily, BuSpar 15 mg 4 times daily Bipolar disorder: Abilify 30 mg nightly, Wellbutrin 100 mg daily, BuSpar 15 mg 4 times daily Neuropathy: Gabapentin 300 mg 3 times daily  Most recent office visit 11/01/2022 in which patient was started on prazosin for night terrors as well as trazodone.  Patient TSH level was checked at that time.  Patient also referred to [facility] for bipolar disorder.  Patient recently had a telehealth visit to discuss vertigo.   Most recent labs: TSH: 11/01/2022 TSH 3.11 BMP 05/01/16/2020: Sodium 141, potassium 3.5, creatinine 1.40 12/30/2021 A1c 5.9  Plan for  PAP, colon, Mammo, A1c   Past Medical History:  Diagnosis Date   Acute midline low back pain 01/13/2016   Bipolar 1 disorder (HCC)    Followed by Mental Health.  All psychiatric medications prescribed by Mental Health.  Stable for many years.     Bronchitis 03/15/2018   Deep venous thrombosis of leg (HCC) 2012   completed year of coumadin    Dyspareunia 03/04/11   Healthcare maintenance 08/16/2013   Hx of measles    Hypothyroidism    Hypothyroidism since 8th grade.  Managed by Dr. Sharl Ma, Endocrinology.  On synthroid.  No prior thyroid surgery/ablation.    Migraines    Monilia infection 12/31/10   RHINITIS, ALLERGIC NOS 04/10/2007   Qualifier: Diagnosis of  By: Duke Salvia     Vaginal atrophy 03/04/11     Current Outpatient Medications:    acetaminophen (TYLENOL) 500 MG tablet, Take 1,500 mg by mouth every 6 (six) hours as needed for moderate pain or headache., Disp: , Rfl:    albuterol (VENTOLIN HFA) 108 (90 Base) MCG/ACT inhaler, Inhale 1-2 puffs into the lungs every 6 (six) hours as needed for wheezing or shortness of breath., Disp: 8 g, Rfl: 2   ARIPiprazole  (ABILIFY) 30 MG tablet, Take 30 mg by mouth at bedtime., Disp: , Rfl: 2   aspirin EC 81 MG tablet, Take 1 tablet (81 mg total) by mouth daily. Swallow whole., Disp: 30 tablet, Rfl: 12   buPROPion (WELLBUTRIN XL) 150 MG 24 hr tablet, Take 150 mg by mouth daily. , Disp: , Rfl: 2   busPIRone (BUSPAR) 15 MG tablet, Take 15 mg by mouth 4 (four) times daily., Disp: , Rfl:    cyclobenzaprine (FLEXERIL) 10 MG tablet, Take 1 tablet (10 mg total) by mouth 3 (three) times daily as needed for muscle spasms. Take one tablet daily as needed., Disp: 30 tablet, Rfl: 0   diclofenac Sodium (VOLTAREN) 1 % GEL, Apply 4 g topically 4 (four) times daily., Disp: 350 g, Rfl: 0   estrogens, conjugated, (PREMARIN) 0.45 MG tablet, Take 1 tablet (0.45 mg total) by mouth daily. Take daily for 21 days then do not take for 7 days. (Patient not taking: Reported on 12/30/2021), Disp: 90 tablet, Rfl: 3   fluticasone (FLONASE) 50 MCG/ACT nasal spray, Place 1 spray into both nostrils daily for 14 days., Disp: 18.2 mL, Rfl: 2   gabapentin (NEURONTIN) 300 MG capsule, Take 1 capsule (300 mg total) by mouth 3 (three) times daily., Disp: 270 capsule, Rfl: 6   guaiFENesin (ROBITUSSIN) 100 MG/5ML liquid, Take 10 mLs by mouth every 6 (six) hours as needed for cough or to loosen phlegm.,  Disp: 236 mL, Rfl: 0   hydrOXYzine (ATARAX) 50 MG tablet, Take 1 tablet (50 mg total) by mouth every 6 (six) hours as needed., Disp: 30 tablet, Rfl: 0   levothyroxine (SYNTHROID) 112 MCG tablet, TAKE 1 TABLET(112 MCG) BY MOUTH DAILY BEFORE BREAKFAST, Disp: 90 tablet, Rfl: 1   magic mouthwash w/lidocaine SOLN, Take 5 mLs by mouth 4 (four) times daily as needed for mouth pain., Disp: 100 mL, Rfl: 0   meclizine (ANTIVERT) 25 MG tablet, TAKE 1 TABLET(25 MG) BY MOUTH TWICE DAILY AS NEEDED FOR DIZZINESS, Disp: 30 tablet, Rfl: 0   metoCLOPramide (REGLAN) 10 MG tablet, Take 1 tablet (10 mg total) by mouth 3 (three) times daily as needed for nausea. (Patient not taking:  Reported on 12/31/2021), Disp: 10 tablet, Rfl: 0   nystatin (MYCOSTATIN/NYSTOP) powder, Apply topically 4 (four) times daily. (Patient taking differently: Apply 1 application  topically 2 (two) times daily as needed (rash).), Disp: 15 g, Rfl: 0   prazosin (MINIPRESS) 1 MG capsule, Take 1 capsule (1 mg total) by mouth at bedtime., Disp: 30 capsule, Rfl: 0   propranolol (INDERAL) 80 MG tablet, Take 1 tablet (80 mg total) by mouth 3 (three) times daily., Disp: 270 tablet, Rfl: 1   rizatriptan (MAXALT) 5 MG tablet, Please take 1 tablet by mouth daily as needed for migraines., Disp: 12 tablet, Rfl: 0   simvastatin (ZOCOR) 40 MG tablet, TAKE 1 TABLET BY MOUTH EVERY DAY AT 6 PM, Disp: 90 tablet, Rfl: 3   topiramate (TOPAMAX) 50 MG tablet, Take 1 tablet (50 mg total) by mouth 2 (two) times daily., Disp: 60 tablet, Rfl: 3   traZODone (DESYREL) 100 MG tablet, Take 1 tablet (100 mg total) by mouth at bedtime. For sleep, Disp: 10 tablet, Rfl: 0  Review of Systems:  ***  Constitutional: Eye: Respiratory: Cardiovascular: GI: MSK: GU: Skin: Neuro: Endocrine:   Physical Exam:  There were no vitals filed for this visit. *** General: Patient is sitting comfortably in the room  Eyes: Pupils equal and reactive to light, EOM intact  Head: Normocephalic, atraumatic  Neck: Supple, nontender, full range of motion, No JVD Cardio: Regular rate and rhythm, no murmurs, rubs or gallops. 2+ pulses to bilateral upper and lower extremities  Chest: No chest tenderness Pulmonary: Clear to ausculation bilaterally with no rales, rhonchi, and crackles  Abdomen: Soft, nontender with normoactive bowel sounds with no rebound or guarding  Neuro: Alert and orientated x3. CN II-XII intact. Sensation intact to upper and lower extremities. 2+ patellar reflex.  Back: No midline tenderness, no step off or deformities noted. No paraspinal muscle tenderness.  Skin: No rashes noted  MSK: 5/5 strength to upper and lower  extremities.    Assessment & Plan:   No problem-specific Assessment & Plan notes found for this encounter.    Patient {GC/GE:3044014::"discussed with","seen with"} Dr. {NAMES:[id number]::"Guilloud","Hoffman","Mullen","Narendra","Williams","Vincent"}  Modena Slater, DO PGY-1 Internal Medicine Resident  Pager: [phone]

## 2023-03-30 ENCOUNTER — Telehealth: Payer: Self-pay

## 2023-03-30 MED ORDER — DICYCLOMINE HCL 10 MG PO CAPS: 10.0000 mg | ORAL_CAPSULE | Freq: Three times a day (TID) | ORAL | 0 refills | Status: DC | PRN

## 2023-03-30 NOTE — Telephone Encounter (Signed)
Requesting to speak with a nurse about medication refill for stomach spasm. Please call pt back.

## 2023-03-30 NOTE — Telephone Encounter (Signed)
Returned call to patient. States she was seen in [facility]: Emergency Room last week for abdominal cramping. Had abd CT which was "normal except for poop seen." She was placed on dicyclomine 10 mg BID PRN and this has helped her stay regular. She [facility] [place]  Discussed no show on 5/28. States she was unaware of this appt. ED f/u scheduled for 6/7.

## 2023-04-07 ENCOUNTER — Encounter: Payer: Medicaid Other | Admitting: Student

## 2023-04-11 ENCOUNTER — Other Ambulatory Visit: Payer: Self-pay

## 2023-04-11 DIAGNOSIS — R42 Dizziness and giddiness: Secondary | ICD-10-CM

## 2023-04-11 MED ORDER — MECLIZINE HCL 25 MG PO TABS: ORAL_TABLET | ORAL | 0 refills | Status: DC

## 2023-04-11 NOTE — Telephone Encounter (Signed)
Pt is requesting her :  meclizine (ANTIVERT) 25 MG tablet    Going to    [id number] [place], [place] - [address]

## 2023-04-14 ENCOUNTER — Other Ambulatory Visit: Payer: Self-pay

## 2023-04-14 DIAGNOSIS — G43009 Migraine without aura, not intractable, without status migrainosus: Secondary | ICD-10-CM

## 2023-04-14 DIAGNOSIS — I10 Essential (primary) hypertension: Secondary | ICD-10-CM

## 2023-04-14 NOTE — Telephone Encounter (Signed)
Requesting refill on [facility]ole @ [facility].

## 2023-04-15 MED ORDER — PROPRANOLOL HCL 80 MG PO TABS: 80.0000 mg | ORAL_TABLET | Freq: Three times a day (TID) | ORAL | 1 refills | Status: DC

## 2023-04-17 ENCOUNTER — Other Ambulatory Visit: Payer: Self-pay

## 2023-04-17 NOTE — Telephone Encounter (Signed)
Medication refill, patient is also requesting a rx for pantoprazole.

## 2023-04-20 ENCOUNTER — Telehealth: Payer: Self-pay | Admitting: *Deleted

## 2023-04-20 NOTE — Telephone Encounter (Deleted)
Refused on 6/18.     Refused    Disp Refills Start End  dicyclomine (BENTYL) 10 MG capsule 21 capsule 0 04/18/2023 04/25/2023  Sig - Route: Take 1 capsule (10 mg total) by mouth 3 (three) times daily as needed for up to 7 days for spasms. - Oral  Class: Normal  DAW: No  Reason for Refusal: Refill not appropriate  Refused By: Modena Slater, DO  nystatin (MYCOSTATIN/NYSTOP) powder 15 g 0 04/18/2023   Sig - Route: Apply topically 4 (four) times daily. - Topical  Class: Normal  DAW: No  Reason for Refusal: Refill not appropriate  Refused By: Modena Slater, DO

## 2023-04-20 NOTE — Telephone Encounter (Signed)
Call from pt asking why Nystatin and dicyclomine were not refilled. Refills requested 04/17/23. Refused per Dr Allena Katz. I told pt I will ask the doctor. Thanks

## 2023-04-24 NOTE — Telephone Encounter (Signed)
Pt called and informed of Dr Eliane Decree response about her refill requests. Pt states  she has heat rash underneath her breasts; need nystatin powder. And she stomach spasms so she she needs Bentyl. She also requesting a refill on Pantoprazole for acid reflux;  this med is not current med list. Thanks

## 2023-05-03 ENCOUNTER — Encounter: Payer: Medicaid Other | Admitting: Student

## 2023-05-17 ENCOUNTER — Other Ambulatory Visit: Payer: Self-pay | Admitting: Student

## 2023-05-17 DIAGNOSIS — M545 Low back pain, unspecified: Secondary | ICD-10-CM

## 2023-05-17 DIAGNOSIS — R42 Dizziness and giddiness: Secondary | ICD-10-CM

## 2023-05-17 DIAGNOSIS — J40 Bronchitis, not specified as acute or chronic: Secondary | ICD-10-CM

## 2023-05-17 MED ORDER — ALBUTEROL SULFATE HFA 108 (90 BASE) MCG/ACT IN AERS
1.0000 | INHALATION_SPRAY | Freq: Four times a day (QID) | RESPIRATORY_TRACT | 2 refills | Status: AC | PRN
Start: 2023-05-17 — End: ?

## 2023-05-17 MED ORDER — CYCLOBENZAPRINE HCL 10 MG PO TABS
10.0000 mg | ORAL_TABLET | Freq: Three times a day (TID) | ORAL | 0 refills | Status: DC | PRN
Start: 2023-05-17 — End: 2023-06-09

## 2023-05-17 NOTE — Telephone Encounter (Signed)
Pt called on Monday and is needing her medications filled.    meclizine (ANTIVERT) 25 MG tablet   pantoprazole (PROTONIX) 40 MG tablet    albuterol (VENTOLIN HFA) 108 (90 Base) MCG/ACT inhaler  cyclobenzaprine (FLEXERIL) 10 MG tablet   [id number] [place], [place] - [address] (Ph: [phone])

## 2023-05-17 NOTE — Telephone Encounter (Signed)
Next appt scheduled 06/01/23 with Dr Hessie Diener.

## 2023-05-18 ENCOUNTER — Other Ambulatory Visit: Payer: Self-pay | Admitting: Student

## 2023-05-18 DIAGNOSIS — G43009 Migraine without aura, not intractable, without status migrainosus: Secondary | ICD-10-CM

## 2023-05-18 DIAGNOSIS — R42 Dizziness and giddiness: Secondary | ICD-10-CM

## 2023-05-18 NOTE — Telephone Encounter (Signed)
Patient is out of medication

## 2023-05-19 ENCOUNTER — Ambulatory Visit: Payer: MEDICAID | Admitting: Internal Medicine

## 2023-05-19 ENCOUNTER — Other Ambulatory Visit: Payer: Self-pay | Admitting: *Deleted

## 2023-05-19 DIAGNOSIS — H8110 Benign paroxysmal vertigo, unspecified ear: Secondary | ICD-10-CM

## 2023-05-19 DIAGNOSIS — R42 Dizziness and giddiness: Secondary | ICD-10-CM

## 2023-05-19 MED ORDER — MECLIZINE HCL 25 MG PO TABS: ORAL_TABLET | ORAL | 0 refills | Status: DC

## 2023-05-19 MED ORDER — RIZATRIPTAN BENZOATE 5 MG PO TABS: ORAL_TABLET | ORAL | 0 refills | Status: DC

## 2023-05-19 NOTE — Telephone Encounter (Signed)
Received a call from pt asking why Meclizine has not been refilled. Informed pt "Needs an appt" per her PCP. Pt stated she has an appt on 8/1 and wants to know why her doctor will not refill her med. She's requesting a call from her doctor. I told pt I will relay her message.

## 2023-05-19 NOTE — Telephone Encounter (Signed)
Called patient lvm for her to make an appointment in order for her to continue to get her medications refilled.

## 2023-05-19 NOTE — Progress Notes (Signed)
   I connected with  [name] on 05/19/23 by telephone and verified that I am speaking with the correct person using two identifiers.   I discussed the limitations of evaluation and management by telemedicine. The patient expressed understanding and agreed to proceed.  CC: vertigo  This is a telephone encounter between [name] and [employer] on 05/19/2023 for vertigo. The visit was conducted with the patient located at home and Rudene Christians at [facility]. The patient's identity was confirmed using their DOB and current address. The patient has consented to being evaluated through a telephone encounter and understands the associated risks (an examination cannot be done and the patient may need to come in for an appointment) / benefits (allows the patient to remain at home, decreasing exposure to coronavirus). I personally spent 5 minutes on medical discussion.   HPI:  [name] is a 52 y.o. with PMH as below.   Please see A&P for assessment of the patient's acute and chronic medical conditions.   Past Medical History:  Diagnosis Date   Acute midline low back pain 01/13/2016   Bipolar 1 disorder (HCC)    Followed by Mental Health.  All psychiatric medications prescribed by Mental Health.  Stable for many years.     Bronchitis 03/15/2018   Deep venous thrombosis of leg (HCC) 2012   completed year of coumadin    Dyspareunia 03/04/11   Healthcare maintenance 08/16/2013   Hx of measles    Hypothyroidism    Hypothyroidism since 8th grade.  Managed by Dr. Sharl Ma, Endocrinology.  On synthroid.  No prior thyroid surgery/ablation.    Migraines    Monilia infection 12/31/10   RHINITIS, ALLERGIC NOS 04/10/2007   Qualifier: Diagnosis of  By: Duke Salvia     Vaginal atrophy 03/04/11   Review of Systems:  dizziness with head motions, denies nausea and vomiting  Assessment & Plan:   BPPV (benign paroxysmal positional vertigo) She continues to have symptoms of vertigo for which  she takes meclizine. On chart review she has been taking this medication since 2015. She has about 2 episodes of vertigo daily and meclizine relieves symptoms. She has not recently been evaluated in clinic in last 6 months and has missed 3 appointments recently. She was referred to vestibular rehab in May but did not want to drive 30 minutes form [place] for appointments.  She would like another referral for this and I do see that there is a PT in [place] that offers therapy. P: Refill sent for meclizine 25 mg, #20 tabs sent Referral for vestibular rehab    Patient discussed with Dr. Percell Boston Danielle Mink, D.O. [facility] Internal Medicine  PGY-2 Pager: [phone]  Phone: [phone] Date 05/19/2023  Time 3:56 PM

## 2023-05-19 NOTE — Telephone Encounter (Signed)
Pt called / informed she has been scheduled a telehealth appt today w/Dr Masters.

## 2023-05-19 NOTE — Assessment & Plan Note (Addendum)
She continues to have symptoms of vertigo for which she takes meclizine. On chart review she has been taking this medication since 2015. She has about 2 episodes of vertigo daily and meclizine relieves symptoms. She has not recently been evaluated in clinic in last 6 months and has missed 3 appointments recently. She was referred to vestibular rehab in May but did not want to drive 30 minutes form [place] for appointments.  She would like another referral for this and I do see that there is a PT in [place] that offers therapy. P: Refill sent for meclizine 25 mg, #20 tabs sent Referral for vestibular rehab

## 2023-05-19 NOTE — Telephone Encounter (Signed)
Patient is scheduled to return on 8/1 @1 :30. Patient stated she promises she will keep this appointment.[name]

## 2023-05-24 ENCOUNTER — Encounter: Payer: Medicaid Other | Admitting: Student

## 2023-05-28 ENCOUNTER — Other Ambulatory Visit: Payer: Self-pay | Admitting: Student

## 2023-05-28 DIAGNOSIS — F419 Anxiety disorder, unspecified: Secondary | ICD-10-CM

## 2023-05-28 MED ORDER — HYDROXYZINE HCL 50 MG PO TABS
50.0000 mg | ORAL_TABLET | Freq: Four times a day (QID) | ORAL | 0 refills | Status: DC | PRN
Start: 2023-05-28 — End: 2023-06-23

## 2023-05-28 NOTE — Progress Notes (Signed)
Received call from patient to refill her Atarax as she is nearing the anniversary of her mother's passing. Reviewed coping/grounding techniques and discussed need to follow up in the clinic for better long term therapy as she has been on Atarax for a prolonged period of time. She has previously been on Bupropion and Buspar but does not take this medication anymore. She denied SI or HI. She denied using alcohol. Advised patient to not take more atarax than it is prescribed PRN and to not combine with other centrally acting medications or substances, which were reviewed by phone.  Patient will follow up with Dr. Hessie Diener at [facility] on 06/01/2023. [facility] [place] [place] [facility]  Morene Crocker, MD [facility] Internal Medicine Program - PGY-2 05/28/2023, 10:33 AM Please contact the on call pager after 5 pm and on weekends at [phone].    Marland Kitchen

## 2023-06-01 ENCOUNTER — Encounter: Payer: MEDICAID | Admitting: Student

## 2023-06-05 ENCOUNTER — Other Ambulatory Visit: Payer: Self-pay | Admitting: Student

## 2023-06-05 DIAGNOSIS — F419 Anxiety disorder, unspecified: Secondary | ICD-10-CM

## 2023-06-09 ENCOUNTER — Other Ambulatory Visit: Payer: Self-pay | Admitting: Student

## 2023-06-09 DIAGNOSIS — M545 Low back pain, unspecified: Secondary | ICD-10-CM

## 2023-06-09 MED ORDER — CYCLOBENZAPRINE HCL 10 MG PO TABS
10.0000 mg | ORAL_TABLET | Freq: Three times a day (TID) | ORAL | 0 refills | Status: DC | PRN
Start: 2023-06-09 — End: 2023-07-09

## 2023-06-09 NOTE — Progress Notes (Signed)
After hours clinic page for acute muscle strain in lower back. She denies any red flag symptoms and has taken flexeril for similar pain in the past but did not have any tonight, so she is requesting a refill. She has been taken tylenol and motrin. She does have CKD 3a based on BMP from 1 year ago so I advised her to take tylenol 1000 mg every 8 hours and avoid NSAIDs. She could pick up voltaren gel and I will refill her flexeril. Discuss concerning symptoms to either call back or go to the ED. She also has an appointment in the clinic on 06/14/2023.  Rocky Morel, DO Internal Medicine Resident, PGY-2 Pager# [phone] Please contact the on call pager after 5 pm and on weekends at [phone].

## 2023-06-14 ENCOUNTER — Ambulatory Visit: Payer: MEDICAID | Admitting: Student

## 2023-06-14 ENCOUNTER — Encounter: Payer: Self-pay | Admitting: Student

## 2023-06-14 VITALS — BP 119/79 | HR 73 | Temp 98.2°F | Wt 211.5 lb

## 2023-06-14 DIAGNOSIS — R42 Dizziness and giddiness: Secondary | ICD-10-CM

## 2023-06-14 DIAGNOSIS — R61 Generalized hyperhidrosis: Secondary | ICD-10-CM

## 2023-06-14 DIAGNOSIS — H8110 Benign paroxysmal vertigo, unspecified ear: Secondary | ICD-10-CM

## 2023-06-14 DIAGNOSIS — G8929 Other chronic pain: Secondary | ICD-10-CM | POA: Diagnosis not present

## 2023-06-14 DIAGNOSIS — G43009 Migraine without aura, not intractable, without status migrainosus: Secondary | ICD-10-CM | POA: Diagnosis not present

## 2023-06-14 DIAGNOSIS — E7800 Pure hypercholesterolemia, unspecified: Secondary | ICD-10-CM

## 2023-06-14 DIAGNOSIS — K219 Gastro-esophageal reflux disease without esophagitis: Secondary | ICD-10-CM | POA: Diagnosis not present

## 2023-06-14 DIAGNOSIS — L304 Erythema intertrigo: Secondary | ICD-10-CM

## 2023-06-14 DIAGNOSIS — E039 Hypothyroidism, unspecified: Secondary | ICD-10-CM

## 2023-06-14 DIAGNOSIS — M25561 Pain in right knee: Secondary | ICD-10-CM

## 2023-06-14 DIAGNOSIS — Z8673 Personal history of transient ischemic attack (TIA), and cerebral infarction without residual deficits: Secondary | ICD-10-CM

## 2023-06-14 HISTORY — DX: Generalized hyperhidrosis: R61

## 2023-06-14 MED ORDER — GABAPENTIN 300 MG PO CAPS
300.0000 mg | ORAL_CAPSULE | Freq: Three times a day (TID) | ORAL | 6 refills | Status: DC
Start: 2023-06-14 — End: 2023-06-14

## 2023-06-14 MED ORDER — MECLIZINE HCL 25 MG PO TABS
ORAL_TABLET | ORAL | 0 refills | Status: DC
Start: 2023-06-14 — End: 2023-07-09

## 2023-06-14 MED ORDER — ALUMINUM CHLORIDE 20 % EX SOLN
Freq: Every day | CUTANEOUS | 3 refills | Status: AC
Start: 2023-06-14 — End: ?

## 2023-06-14 MED ORDER — PANTOPRAZOLE SODIUM 40 MG PO TBEC: 40.0000 mg | DELAYED_RELEASE_TABLET | Freq: Every day | ORAL | 11 refills | Status: DC

## 2023-06-14 MED ORDER — GABAPENTIN 300 MG PO CAPS
300.0000 mg | ORAL_CAPSULE | Freq: Three times a day (TID) | ORAL | 0 refills | Status: DC
Start: 2023-06-14 — End: 2023-09-26

## 2023-06-14 MED ORDER — NYSTATIN 100000 UNIT/GM EX POWD
Freq: Three times a day (TID) | CUTANEOUS | 0 refills | Status: DC
Start: 2023-06-14 — End: 2024-03-12

## 2023-06-14 MED ORDER — RIZATRIPTAN BENZOATE 5 MG PO TABS
ORAL_TABLET | ORAL | 0 refills | Status: DC
Start: 2023-06-14 — End: 2023-07-26

## 2023-06-14 NOTE — Assessment & Plan Note (Addendum)
Has history of hysterectomy and salpingectomy done over 25 years ago. Has tried estrogen in the past but it was not effective. States that it has been present for over 5 years and now it is becoming something that it is interfering with her daily life. She is frustrated by it, irritable, she hates that she is drenched all the time. Heat intolerance, no flushing. Weight loss. Has migraine HA and not changed in nature since. BP and HR are WNL. She is a smoker with a history of strokes and would not recommend starting estrogen due to a risk of DVT, and timing with hysterectomy would not line up. No cough, lungs are clear to auscultation. Will do CBC to check for signs of malignancy. TB unlikely. Is not ill appearing or presents with a fever to think she has an infection. She does have the history of hypothyroidism, so checking the TSH today would be reasonable.  Medications were thoroughly reviewed and she is not taking the Abilify she was prescribed by behavioral health which could cause hyperhidrosis. Otherwise unlikely that current medications are culprits of her symptoms.   -CBC -CMP  -TSH  -Start drysol 20%   Addendum: TSH slighthly elevated. Levothyroxine started at daily. Drysol not covered by insurance, start topical glycopyrrolate pads under each arm no more than once daily. Pt was counseled on anti-muscarinic side effects and told to stop using should she experience any.

## 2023-06-14 NOTE — Assessment & Plan Note (Signed)
Has history of hypothyroidism since she was 51 yo. On levothyroxine 112mg  daily. She denies any constipation or diarrhea. Endorses hyperhydrosis. Weight loss over the last year. Is irritable. Has increased depression. Cannot tolerate the heat.  -Measure TSH today  -Cw levothyroxine 112mg  daily before breakfast.

## 2023-06-14 NOTE — Assessment & Plan Note (Addendum)
Hx of migraine headaches, uses rizatriptan. Has seen neurology in the past and follows with neurology for her headaches. 12 pills sent in today but should see neurology if her migraine headaches are requiring continuous use and evaluation for other alternative therapies.  -Prescription sent today. -Follow up with Neurology

## 2023-06-14 NOTE — Assessment & Plan Note (Addendum)
Is requesting a refill for her gabapentin, but I cannot find a specific diagnosis for her gabapentin. I have sent a one month supply but should be addressed at next visit. Has history of knee replacement surgery and states that she is using gabapentin for knee pain, however, this pain would be musculoskeletal and should not be treated with gabapentin. Patient could also not state why she has been on it except that it is for her knee pain.  -30 day supply sent today but needs to be addressed at next visit

## 2023-06-14 NOTE — Assessment & Plan Note (Signed)
Last time lipids were checked were in 2018. Has history of stroke.  -Lipid panel today.

## 2023-06-14 NOTE — Assessment & Plan Note (Signed)
On pantoprazole 40mg . Refill sent today.

## 2023-06-14 NOTE — Progress Notes (Addendum)
Established Patient Office Visit  Subjective   Patient ID: [name], female    DOB: 03/26/1971  Age: 52 y.o. MRN: [id number]  Chief Complaint  Patient presents with   Medication Refill   Lab check    Thyroid, liver, kidney and cholesterol    Medication Management   Hot Flashes    "Severe" was referred to gyn in past, but nothing has helped  Hysterectomy 25 yrs ago     HPI  [name] is a 52 y.o. coming in today with the above concerns. Please see the A&P for a more detailed history.   Past Medical History:  Diagnosis Date   Acute midline low back pain 01/13/2016   Bipolar 1 disorder (HCC)    Followed by Mental Health.  All psychiatric medications prescribed by Mental Health.  Stable for many years.     Bronchitis 03/15/2018   Deep venous thrombosis of leg (HCC) 2012   completed year of coumadin    Dyspareunia 03/04/11   Healthcare maintenance 08/16/2013   Hx of measles    Hypothyroidism    Hypothyroidism since 8th grade.  Managed by Dr. Sharl Ma, Endocrinology.  On synthroid.  No prior thyroid surgery/ablation.    Migraines    Monilia infection 12/31/10   RHINITIS, ALLERGIC NOS 04/10/2007   Qualifier: Diagnosis of  By: Duke Salvia     Vaginal atrophy 03/04/11   Review of Systems  Constitutional:  Positive for diaphoresis, malaise/fatigue and weight loss. Negative for chills and fever.  Respiratory:  Negative for cough.   Gastrointestinal:  Positive for heartburn. Negative for constipation, diarrhea, nausea and vomiting.  Musculoskeletal:  Positive for joint pain.  Skin:  Positive for rash.  Neurological:  Positive for dizziness and headaches.  Psychiatric/Behavioral:  Positive for depression. Negative for hallucinations and suicidal ideas. The patient is nervous/anxious and has insomnia.      Objective:     BP 119/79 (BP Location: Right Arm, Patient Position: Sitting, Cuff Size: Normal)   Pulse 73   Temp 98.2 F (36.8 C) (Oral)   Wt 211 lb 8 oz (95.9  kg)   SpO2 99%   BMI 35.20 kg/m  BP Readings from Last 3 Encounters:  06/14/23 119/79  11/01/22 125/80  08/18/22 121/73   Wt Readings from Last 3 Encounters:  06/14/23 211 lb 8 oz (95.9 kg)  11/01/22 206 lb (93.4 kg)  08/18/22 203 lb 9.6 oz (92.4 kg)     Physical Exam Constitutional:      General: She is not in acute distress.    Appearance: She is not ill-appearing or toxic-appearing.  HENT:     Nose: No congestion.     Mouth/Throat:     Mouth: Mucous membranes are moist.     Comments: No thyromegaly or nodules on palpation. Cardiovascular:     Rate and Rhythm: Normal rate and regular rhythm.     Heart sounds: No murmur heard.    No friction rub. No gallop.  Pulmonary:     Effort: Pulmonary effort is normal. No respiratory distress.     Breath sounds: No wheezing, rhonchi or rales.  Abdominal:     General: Bowel sounds are normal. There is no distension.     Palpations: There is no mass.     Tenderness: There is no abdominal tenderness. There is no guarding.  Musculoskeletal:        General: No swelling.     Right lower leg: No edema.  Left lower leg: No edema.  Neurological:     Mental Status: She is alert.     Cranial Nerves: No cranial nerve deficit.     Sensory: No sensory deficit.     Motor: No weakness.     Coordination: Coordination normal.     Gait: Gait normal.  Psychiatric:        Mood and Affect: Mood is anxious and depressed.        Thought Content: Thought content normal.    Last CBC Lab Results  Component Value Date   WBC 13.1 (H) 06/14/2023   HGB 13.2 06/14/2023   HCT 38.1 06/14/2023   MCV 84 06/14/2023   MCH 29.2 06/14/2023   RDW 14.2 06/14/2023   PLT 333 06/14/2023   Last metabolic panel Lab Results  Component Value Date   GLUCOSE 85 06/14/2023   NA 142 06/14/2023   K 4.8 06/14/2023   CL 109 (H) 06/14/2023   CO2 16 (L) 06/14/2023   BUN 19 06/14/2023   CREATININE 1.42 (H) 06/14/2023   GFRNONAA 45 (L) 05/01/2022   CALCIUM 9.3  06/14/2023   PROT 7.0 06/14/2023   ALBUMIN 4.3 06/14/2023   LABGLOB 2.7 06/14/2023   AGRATIO 1.6 12/30/2021   BILITOT <0.2 06/14/2023   ALKPHOS 94 06/14/2023   AST 14 06/14/2023   ALT 12 06/14/2023   ANIONGAP 12 05/01/2022   Last lipids Lab Results  Component Value Date   CHOL 296 (H) 06/14/2023   HDL 39 (L) 06/14/2023   LDLCALC 176 (H) 06/14/2023   TRIG 410 (H) 06/14/2023   CHOLHDL 7.6 (H) 06/14/2023   Last hemoglobin A1c Lab Results  Component Value Date   HGBA1C 5.9 (H) 12/30/2021   Last thyroid functions Lab Results  Component Value Date   TSH 4.960 (H) 06/14/2023   T3TOTAL 69.0 (L) 09/04/2013     The 10-year ASCVD risk score (Arnett DK, et al., 2019) is: 10.9%    Assessment & Plan:   Problem List Items Addressed This Visit       Cardiovascular and Mediastinum   Migraines (Chronic)    Hx of migraine headaches, uses rizatriptan. Has seen neurology in the past and follows with neurology for her headaches. 12 pills sent in today but should see neurology if her migraine headaches are requiring continuous use and evaluation for other alternative therapies.  -Prescription sent today. -Follow up with Neurology      Relevant Medications   rizatriptan (MAXALT) 5 MG tablet   gabapentin (NEURONTIN) 300 MG capsule     Digestive   GERD    On pantoprazole 40mg . Refill sent today.       Relevant Medications   pantoprazole (PROTONIX) 40 MG tablet   meclizine (ANTIVERT) 25 MG tablet     Endocrine   Hypothyroidism (Chronic)    Has history of hypothyroidism since she was 52 yo. On levothyroxine 112mg  daily. She denies any constipation or diarrhea. Endorses hyperhydrosis. Weight loss over the last year. Is irritable. Has increased depression. Cannot tolerate the heat.  -Measure TSH today  -Cw levothyroxine 112mg  daily before breakfast.       Relevant Medications   levothyroxine (SYNTHROID) 125 MCG tablet   Other Relevant Orders   TSH (Completed)     Nervous and  Auditory   BPPV (benign paroxysmal positional vertigo)    Was last seen by Dr. Sloan Leiter in July. She was referred to vestibular rehab in [place]. Uses meclezine 25mg  which have been helping with her vertigo.  -  See vestibular rehab  -Cw meclizine 25mg  tablet as needed for dizziness.  -referral for vestibular rehab placed.       Relevant Medications   meclizine (ANTIVERT) 25 MG tablet     Musculoskeletal and Integument   Intertrigo    Has history of intertrigo and redness under bra line. Wants nystatin refill today.  -Nystatin refilled.      Relevant Medications   nystatin (MYCOSTATIN/NYSTOP) powder   Hyperhidrosis - Primary    Has history of hysterectomy and salpingectomy done over 25 years ago. Has tried estrogen in the past but it was not effective. States that it has been present for over 5 years and now it is becoming something that it is interfering with her daily life. She is frustrated by it, irritable, she hates that she is drenched all the time. Heat intolerance, no flushing. Weight loss. Has migraine HA and not changed in nature since. BP and HR are WNL. She is a smoker with a history of strokes and would not recommend starting estrogen due to a risk of DVT, and timing with hysterectomy would not line up. No cough, lungs are clear to auscultation. Will do CBC to check for signs of malignancy. TB unlikely. Is not ill appearing or presents with a fever to think she has an infection. She does have the history of hypothyroidism, so checking the TSH today would be reasonable.  Medications were thoroughly reviewed and she is not taking the Abilify she was prescribed by behavioral health which could cause hyperhidrosis. Otherwise unlikely that current medications are culprits of her symptoms.   -CBC -CMP  -TSH  -Start drysol 20%   Addendum: TSH slighthly elevated. Levothyroxine started at daily. Drysol not covered by insurance, start topical glycopyrrolate pads under each arm no  more than once daily. Pt was counseled on anti-muscarinic side effects and told to stop using should she experience any.       Relevant Medications   aluminum chloride (DRYSOL) 20 % external solution   Glycopyrronium Tosylate 2.4 % PADS   Other Relevant Orders   TSH (Completed)   CBC no Diff (Completed)   CMP14 + Anion Gap (Completed)     Other   HYPERCHOLESTEROLEMIA, PURE    Last time lipids were checked were in 2018. Has history of stroke.  -Lipid panel today.       Right knee pain    Is requesting a refill for her gabapentin, but I cannot find a specific diagnosis for her gabapentin. I have sent a one month supply but should be addressed at next visit. Has history of knee replacement surgery and states that she is using gabapentin for knee pain, however, this pain would be musculoskeletal and should not be treated with gabapentin. Patient could also not state why she has been on it except that it is for her knee pain.  -30 day supply sent today but needs to be addressed at next visit      Relevant Medications   gabapentin (NEURONTIN) 300 MG capsule   Other Visit Diagnoses     Vertigo       Relevant Medications   meclizine (ANTIVERT) 25 MG tablet   History of stroke       Relevant Orders   Lipid Profile (Completed)       Return in about 4 weeks (around 07/12/2023).   Manuela Neptune, MD

## 2023-06-14 NOTE — Patient Instructions (Signed)
Thank you, [name] for allowing [place] to provide your care today. Today we discussed:   That your excessive sweating is unlikely to be related to any of the medications you are taking. We are prescribing Drysol for you to use.   Apply once daily at bedtime; once excessive sweating has stopped, may decrease to once or twice weekly, or as needed. Wash treated area in the morning.  We are checking your blood and will give you a call with the results.   Please let us know if you have not heard back with regards to starting Vertigo Therapy.   I have ordered the following labs for you:   Lab Orders         TSH         Lipid Profile         CBC no Diff         CMP14 + Anion Gap       I have ordered the following medication/changed the following medications:   Stop the following medications: Medications Discontinued During This Encounter  Medication Reason   topiramate (TOPAMAX) 50 MG tablet    traZODone (DESYREL) 100 MG tablet    metoCLOPramide (REGLAN) 10 MG tablet    magic mouthwash w/lidocaine SOLN    guaiFENesin (ROBITUSSIN) 100 MG/5ML liquid    estrogens, conjugated, (PREMARIN) 0.45 MG tablet    dicyclomine (BENTYL) 10 MG capsule    ARIPiprazole (ABILIFY) 30 MG tablet    prazosin (MINIPRESS) 1 MG capsule    nystatin (MYCOSTATIN/NYSTOP) powder Reorder   gabapentin (NEURONTIN) 300 MG capsule Reorder   rizatriptan (MAXALT) 5 MG tablet Reorder   meclizine (ANTIVERT) 25 MG tablet Reorder     Start the following medications: Meds ordered this encounter  Medications   rizatriptan (MAXALT) 5 MG tablet    Sig: Please take 1 tablet by mouth daily as needed for migraines.    Dispense:  12 tablet    Refill:  0   nystatin (MYCOSTATIN/NYSTOP) powder    Sig: Apply topically 3 (three) times daily.    Dispense:  15 g    Refill:  0   gabapentin (NEURONTIN) 300 MG capsule    Sig: Take 1 capsule (300 mg total) by mouth 3 (three) times daily.    Dispense:  270 capsule     Refill:  6   pantoprazole (PROTONIX) 40 MG tablet    Sig: Take 1 tablet (40 mg total) by mouth daily.    Dispense:  30 tablet    Refill:  11   meclizine (ANTIVERT) 25 MG tablet    Sig: TAKE 1 TABLET(25 MG) BY MOUTH TWICE DAILY AS NEEDED FOR DIZZINESS    Dispense:  20 tablet    Refill:  0   aluminum chloride (DRYSOL) 20 % external solution    Sig: Apply topically daily. Apply once daily at bedtime; once excessive sweating has stopped, may decrease to once or twice weekly, or as needed. Wash treated area in the morning.    Dispense:  35 mL    Refill:  3     Follow up: 1 mo   Should you have any questions or concerns please call the internal medicine clinic at [phone].     Manuela Neptune, MD [facility]

## 2023-06-14 NOTE — Assessment & Plan Note (Signed)
Has history of intertrigo and redness under bra line. Wants nystatin refill today.  -Nystatin refilled.

## 2023-06-14 NOTE — Assessment & Plan Note (Addendum)
Was last seen by Dr. Sloan Leiter in July. She was referred to vestibular rehab in [place]. Uses meclezine 25mg  which have been helping with her vertigo.  -See vestibular rehab  -Cw meclizine 25mg  tablet as needed for dizziness.  -referral for vestibular rehab placed.

## 2023-06-16 NOTE — Progress Notes (Signed)
WBC is 13.1 with mild leukocytosis. Results not concerning for malignancy and presentation is inconsistent with infection. Will monitor at next visit.

## 2023-06-17 LAB — CMP14 + ANION GAP
ALT: 12 IU/L (ref 0–32)
AST: 14 IU/L (ref 0–40)
Albumin: 4.3 g/dL (ref 3.8–4.9)
Alkaline Phosphatase: 94 IU/L (ref 44–121)
Anion Gap: 17 mmol/L (ref 10.0–18.0)
BUN/Creatinine Ratio: 13 (ref 9–23)
BUN: 19 mg/dL (ref 6–24)
Bilirubin Total: <0.2 mg/dL (ref 0.0–1.2)
CO2: 16 mmol/L — ABNORMAL LOW (ref 20–29)
Calcium: 9.3 mg/dL (ref 8.7–10.2)
Chloride: 109 mmol/L — ABNORMAL HIGH (ref 96–106)
Creatinine, Ser: 1.42 mg/dL — ABNORMAL HIGH (ref 0.57–1.00)
Globulin, Total: 2.7 g/dL (ref 1.5–4.5)
Glucose: 85 mg/dL (ref 70–99)
Potassium: 4.8 mmol/L (ref 3.5–5.2)
Sodium: 142 mmol/L (ref 134–144)
Total Protein: 7 g/dL (ref 6.0–8.5)
eGFR: 44 mL/min/{1.73_m2} — ABNORMAL LOW (ref >59)

## 2023-06-17 LAB — CBC
Hematocrit: 38.1 % (ref 34.0–46.6)
Hemoglobin: 13.2 g/dL (ref 11.1–15.9)
MCH: 29.2 pg (ref 26.6–33.0)
MCHC: 34.6 g/dL (ref 31.5–35.7)
MCV: 84 fL (ref 79–97)
Platelets: 333 10*3/uL (ref 150–450)
RBC: 4.52 x10E6/uL (ref 3.77–5.28)
RDW: 14.2 % (ref 11.7–15.4)
WBC: 13.1 10*3/uL — ABNORMAL HIGH (ref 3.4–10.8)

## 2023-06-17 LAB — LIPID PANEL
Chol/HDL Ratio: 7.6 ratio — ABNORMAL HIGH (ref 0.0–4.4)
Cholesterol, Total: 296 mg/dL — ABNORMAL HIGH (ref 100–199)
HDL: 39 mg/dL — ABNORMAL LOW (ref >39)
LDL Chol Calc (NIH): 176 mg/dL — ABNORMAL HIGH (ref 0–99)
Triglycerides: 410 mg/dL — ABNORMAL HIGH (ref 0–149)
VLDL Cholesterol Cal: 81 mg/dL — ABNORMAL HIGH (ref 5–40)

## 2023-06-17 LAB — TSH: TSH: 4.96 u[IU]/mL — ABNORMAL HIGH (ref 0.450–4.500)

## 2023-06-19 ENCOUNTER — Telehealth: Payer: Self-pay | Admitting: Student

## 2023-06-19 MED ORDER — LEVOTHYROXINE SODIUM 125 MCG PO TABS
ORAL_TABLET | ORAL | 11 refills | Status: DC
Start: 2023-06-19 — End: 2023-10-17

## 2023-06-19 NOTE — Telephone Encounter (Signed)
Pt requesting a call back. Pt had labs drawn on 06/14/2023 and the pt states no one has called her back.

## 2023-06-19 NOTE — Addendum Note (Signed)
Addended by: Manuela Neptune on: 06/19/2023 04:39 PM   Modules accepted: Orders

## 2023-06-19 NOTE — Progress Notes (Addendum)
Spoke to the patient. She was not taking her simvastatin which would be explain her elevated lipids. Patient will resume taking simvastatin. Her TSH is slightly elevated at 4.9. Dose of levothyroxine increased to daily. She has not been able to use the drysol since her insurance is not covering it. She will see if her symptoms improve taking levothyroxine, although unlikely since she is hypothyroid. She will follow with [place] in a month.

## 2023-06-20 MED ORDER — GLYCOPYRRONIUM TOSYLATE 2.4 % EX PADS
1.0000 | MEDICATED_PAD | Freq: Every day | CUTANEOUS | 1 refills | Status: DC | PRN
Start: 2023-06-20 — End: 2024-03-12

## 2023-06-20 NOTE — Addendum Note (Signed)
Addended by: Manuela Neptune on: 06/20/2023 02:36 PM   Modules accepted: Orders

## 2023-06-21 ENCOUNTER — Telehealth: Payer: Self-pay

## 2023-06-21 DIAGNOSIS — R61 Generalized hyperhidrosis: Secondary | ICD-10-CM

## 2023-06-21 NOTE — Telephone Encounter (Signed)
Pt called stated that her pharmacist told her that her (Glycopyrronium Tosylate 2.4 % PADS  )  is not cover by medicaid   even with a PA

## 2023-06-22 ENCOUNTER — Other Ambulatory Visit: Payer: Self-pay

## 2023-06-22 DIAGNOSIS — F419 Anxiety disorder, unspecified: Secondary | ICD-10-CM

## 2023-06-22 NOTE — Telephone Encounter (Signed)
@   prescriptions for the Hot Flashes are not covered.  Wants to get something else sent in if possible.

## 2023-06-23 ENCOUNTER — Other Ambulatory Visit: Payer: Self-pay | Admitting: Internal Medicine

## 2023-06-23 DIAGNOSIS — F419 Anxiety disorder, unspecified: Secondary | ICD-10-CM

## 2023-06-23 MED ORDER — HYDROXYZINE HCL 50 MG PO TABS
50.0000 mg | ORAL_TABLET | Freq: Four times a day (QID) | ORAL | 0 refills | Status: DC | PRN
Start: 2023-06-23 — End: 2023-10-17

## 2023-06-23 NOTE — Progress Notes (Signed)
Received after hours phone call. Patient is concerned about getting hydroxyzine refilled to help with anxiety. Last refill was at the end of July for 30 tablets. She was seen in [facility] 08/14, however was not able to cover anxiety difficulties due to time limitations. Denies SI or HI. Will refill hydroxyzine and I encouraged her to follow-up in clinic for visit dedicated to helping anxiety. -hydroxyzine 50 mg q 6 hrs as needed # [facility] [place]

## 2023-06-24 ENCOUNTER — Encounter: Payer: Self-pay | Admitting: Internal Medicine

## 2023-06-24 NOTE — Progress Notes (Signed)
Internal Medicine Clinic Attending  I was physically present during the key portions of the resident provided service and participated in the medical decision making of patient's management care. I reviewed pertinent patient test results.  The assessment, diagnosis, and plan were formulated together and I agree with the documentation in the resident's note. Severe hyperhidrosis is interfering with her socialization and negatively impacting mood.  Briefly reviewed her problems and medications - many of her symptomatic meds have been prescribed for over 10 years and original indications perhaps no longer known.  Noted after this visit that gabapentin had been prescribed around 2013 for management of menopausal vasomotor symptoms.  Recommend discontinuation as it is not effective for it's intended purpose.  CKD interesting - etiology?   Miguel Aschoff, MD

## 2023-07-05 ENCOUNTER — Telehealth: Payer: Self-pay | Admitting: Student

## 2023-07-05 NOTE — Telephone Encounter (Signed)
[facility] operator while I was on call.  Tried to call back, but no answer.  By the time I got in touch she was in [facility] ED for dizziness and being worked up for stroke.

## 2023-07-06 ENCOUNTER — Other Ambulatory Visit: Payer: Self-pay

## 2023-07-06 DIAGNOSIS — M545 Low back pain, unspecified: Secondary | ICD-10-CM

## 2023-07-07 NOTE — Telephone Encounter (Signed)
Called patient and lvm for her to give [place] a call back to schedule a follow up appointment in order for her to continue to get her medications refilled.

## 2023-07-08 ENCOUNTER — Telehealth: Payer: Self-pay | Admitting: Student

## 2023-07-08 NOTE — Telephone Encounter (Signed)
Received on-call page on pager to call patient back.  I called the patient back, and she stated that she does not need anything anymore.

## 2023-07-09 ENCOUNTER — Other Ambulatory Visit: Payer: Self-pay | Admitting: Student

## 2023-07-09 DIAGNOSIS — R42 Dizziness and giddiness: Secondary | ICD-10-CM

## 2023-07-09 DIAGNOSIS — M545 Low back pain, unspecified: Secondary | ICD-10-CM

## 2023-07-09 MED ORDER — MECLIZINE HCL 25 MG PO TABS
ORAL_TABLET | ORAL | 0 refills | Status: AC
Start: 2023-07-09 — End: ?

## 2023-07-09 MED ORDER — CYCLOBENZAPRINE HCL 10 MG PO TABS
10.0000 mg | ORAL_TABLET | Freq: Three times a day (TID) | ORAL | 0 refills | Status: AC | PRN
Start: 2023-07-09 — End: ?

## 2023-07-09 NOTE — Progress Notes (Addendum)
Received after hours page. Patient requested refill for meclizine 25 mg BID PRN for her vertigo. States last prescribed/refilled at last OV in August and has helped. Patient also requested refill for Flexeril for her back spasms. Patient states she has scheduled OV with Dr. Allena Katz on 07/18/23. Will send refill for meclizine 25 mg BID PRN (#20 tablets) and Flexeril 10 mg TID PRN (#30 tablets, [facility] refill[facility] but needs to f/u on 9/17 for further refills and re-evaluation. Discussed with patient to f/u with [facility] on 9/17 which patient verbalizes understanding.

## 2023-07-18 ENCOUNTER — Encounter: Payer: MEDICAID | Admitting: Student

## 2023-07-26 ENCOUNTER — Other Ambulatory Visit: Payer: Self-pay | Admitting: Student

## 2023-07-26 DIAGNOSIS — G43009 Migraine without aura, not intractable, without status migrainosus: Secondary | ICD-10-CM

## 2023-07-27 LAB — LAB REPORT - SCANNED
EGFR: 50.7
TSH: 4.68 (ref 0.41–5.90)

## 2023-07-30 ENCOUNTER — Other Ambulatory Visit: Payer: Self-pay | Admitting: Internal Medicine

## 2023-07-30 DIAGNOSIS — M545 Low back pain, unspecified: Secondary | ICD-10-CM

## 2023-07-30 MED ORDER — CYCLOBENZAPRINE HCL 10 MG PO TABS: 10.0000 mg | ORAL_TABLET | Freq: Three times a day (TID) | ORAL | 0 refills | Status: DC | PRN

## 2023-07-30 NOTE — Progress Notes (Unsigned)
Received after hours page for refill on her flexeril. She states it is for her back pain which is bothering her. No red flag symptoms. She was supposed to follow up with her PCP on 07/18/23 but states she was not feeling well so she had to cancel her appointment. It appears she has been getting this refilled recently. Will send one time refill as pt states she will make an appointment tomorrow. Advised we will not be able to send further refills until she is seen in person. Pt is understanding.   Gwenevere Abbot, MD [facility]. [facility] Internal Medicine Residency, PGY-3

## 2023-08-01 ENCOUNTER — Other Ambulatory Visit: Payer: Self-pay | Admitting: Student

## 2023-08-01 DIAGNOSIS — R42 Dizziness and giddiness: Secondary | ICD-10-CM

## 2023-08-03 ENCOUNTER — Other Ambulatory Visit: Payer: Self-pay | Admitting: Family

## 2023-08-03 DIAGNOSIS — Z1231 Encounter for screening mammogram for malignant neoplasm of breast: Secondary | ICD-10-CM

## 2023-08-04 ENCOUNTER — Other Ambulatory Visit: Payer: Self-pay | Admitting: Student

## 2023-08-04 ENCOUNTER — Ambulatory Visit
Admission: RE | Admit: 2023-08-04 | Discharge: 2023-08-04 | Disposition: A | Discharge status: Home / Self Care | Payer: MEDICAID | Source: Ambulatory Visit | Attending: Family | Admitting: Family

## 2023-08-04 DIAGNOSIS — Z1231 Encounter for screening mammogram for malignant neoplasm of breast: Secondary | ICD-10-CM

## 2023-08-17 ENCOUNTER — Other Ambulatory Visit: Payer: Self-pay | Admitting: Internal Medicine

## 2023-08-17 DIAGNOSIS — M545 Low back pain, unspecified: Secondary | ICD-10-CM

## 2023-08-25 ENCOUNTER — Other Ambulatory Visit: Payer: Self-pay | Admitting: Internal Medicine

## 2023-08-25 DIAGNOSIS — M545 Low back pain, unspecified: Secondary | ICD-10-CM

## 2023-08-31 ENCOUNTER — Other Ambulatory Visit: Payer: Self-pay | Admitting: Student

## 2023-09-02 ENCOUNTER — Other Ambulatory Visit: Payer: Self-pay | Admitting: Student

## 2023-09-02 DIAGNOSIS — M545 Low back pain, unspecified: Secondary | ICD-10-CM

## 2023-09-02 MED ORDER — CYCLOBENZAPRINE HCL 10 MG PO TABS
10.0000 mg | ORAL_TABLET | Freq: Three times a day (TID) | ORAL | 0 refills | Status: DC
Start: 2023-09-02 — End: 2023-09-26

## 2023-09-02 NOTE — Progress Notes (Signed)
Received page on the on-call pager. Pt states that she has having severe mid back pain which she has been evaluated for previously. She denies any worsening of the pain, as well as red flag features such as bowel or urine incontinence or neurologic changes.    She takes Flexeril 10mg  TID PRN and has since run out. She states that this medication really helps her, and seems like she's been prescribed this in the past as well. I will send in a short course, and pt states she will make an appointment to be further evaluated in the clinic.   Plan:  - Refill flexeril 10mg  TID PRN  - Pt will make appointment with [facility]

## 2023-09-08 NOTE — Telephone Encounter (Signed)
Received after hours call from [employer] x3. Patient called because pharmacy unable to refill gabapentin prescription.  Refill at this time is inappropriate for that reason. Pharmacy told patient to call [facility] care. Reviewed PDMP and informed patient that pharmacy dispensed 270 tablets (90 day supply) of Gabapentin medication. Advised patient to return to clinic for acute back pain if medications are not being effective but actively denied receiving number of Gabapentin tablets described on dispense history. Patient disconnected the phone when I shared this information.   Morene Crocker, MD [facility] Internal Medicine Program - PGY-2 09/08/2023, 5:25 PM

## 2023-09-09 ENCOUNTER — Telehealth: Payer: Self-pay | Admitting: Student

## 2023-09-09 MED ORDER — GABAPENTIN 300 MG PO CAPS: 300.0000 mg | ORAL_CAPSULE | Freq: Three times a day (TID) | ORAL | 0 refills | Status: DC

## 2023-09-09 MED ORDER — GABAPENTIN 300 MG PO CAPS
300.0000 mg | ORAL_CAPSULE | Freq: Three times a day (TID) | ORAL | 0 refills | Status: DC
Start: 2023-09-09 — End: 2023-09-26

## 2023-09-09 NOTE — Telephone Encounter (Signed)
Received after-hours on-call page from patient requesting callback.  Patient reports that she has ran out of her gabapentin.  Requested to look at [facility] and there was some concern that patient ran out early.  Patient states that she has been 100% honest with her fills and she has also been taking her medications as prescribed.  She denies taking more than prescription dosage.  She reports that her sister also goes to her same pharmacy and she has been running out early as well.  She states that the pharmacy may have not given her the right amount of medication.  I am not sure what is true and what is not.  I confirmed with PDMP and they gave her 270.  She states that she got around 200.  She is about 2 and half weeks early on her refill, but given that I have not seen her and she states that her sister has not been taking her medication and she has not been giving it to anyone else, I have to believe my patient and I will refill her medication.  I did give a very strong warning that this is a one-time thing and I will not be doing this again.  She understood this.  She did state that she does not abuse her medication and she uses it as prescribed.  Will give one-time early refill to last her until November 27 and at that time she can refill her original prescriptions.  Will not do this again.

## 2023-09-09 NOTE — Telephone Encounter (Signed)
Opened in error
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
## 2023-09-19 ENCOUNTER — Ambulatory Visit: Payer: Medicaid Other | Admitting: Diagnostic Neuroimaging

## 2023-09-25 ENCOUNTER — Other Ambulatory Visit: Payer: Self-pay | Admitting: Student

## 2023-09-25 DIAGNOSIS — M545 Low back pain, unspecified: Secondary | ICD-10-CM

## 2023-09-25 DIAGNOSIS — G43009 Migraine without aura, not intractable, without status migrainosus: Secondary | ICD-10-CM

## 2023-09-26 MED ORDER — CYCLOBENZAPRINE HCL 10 MG PO TABS: 10.0000 mg | ORAL_TABLET | Freq: Three times a day (TID) | ORAL | 0 refills | Status: DC

## 2023-10-01 ENCOUNTER — Telehealth: Payer: Self-pay | Admitting: Student

## 2023-10-01 DIAGNOSIS — R42 Dizziness and giddiness: Secondary | ICD-10-CM

## 2023-10-01 MED ORDER — MECLIZINE HCL 25 MG PO TABS: ORAL_TABLET | ORAL | 0 refills | Status: DC

## 2023-10-01 NOTE — Telephone Encounter (Signed)
Received after-hours page.  Returned call to patient and confirmed DOB.  Patient requested refill for her meclizine for her vertigo.  Notes dizziness that she has been dealing with chronically which she takes the meclizine for.  Denies any worsening of the symptom or any additional concerning symptoms.  Will refill her meclizine 25 mg as needed which was last filled in October.  Rx sent to [facility] pharmacy in [place].  Advised patient has [facility] appointment on 12/17 which patient states will make sure she will go to.  Provided precautions if symptoms worsening or develops new symptoms to seek emergent care.  Patient verbalizes understanding and all questions answered.

## 2023-10-02 ENCOUNTER — Telehealth: Payer: Self-pay

## 2023-10-02 NOTE — Telephone Encounter (Signed)
Requesting to speak with a nurse about migraine, please call pt back.

## 2023-10-03 ENCOUNTER — Other Ambulatory Visit: Payer: Self-pay | Admitting: Student

## 2023-10-03 MED ORDER — BUTALBITAL-APAP-CAFFEINE 50-325-40 MG PO TABS
1.0000 | ORAL_TABLET | Freq: Four times a day (QID) | ORAL | 0 refills | Status: DC | PRN
Start: 2023-10-03 — End: 2023-10-17

## 2023-10-03 NOTE — Telephone Encounter (Signed)
RTC from patient states has taken medication that was ordered for her Migraines and the headache has  not gone away.  No available appointments today.  Wants to know what she needs to do now.

## 2023-10-03 NOTE — Telephone Encounter (Signed)
RTC to patient this is 1 of the worst headaches.  No vision changes, weakness , speech changes or change in balance or any other symptoms.  Informed patent that Dr. Ned Card will call after she finishes Clinic this morning and may also call in some Fioricet that she was prescribed before. Patent voiced understanding and will await decision about the Fioricet.

## 2023-10-03 NOTE — Progress Notes (Signed)
Called and spoke with the patient about her migraine, which began yesterday morning and has been refractory to her normal rizatriptan and Tylenol.  The pain is localized to the right side of her head.  She denies any accompanying symptoms including vision changes, balance issues, new neurological symptoms, confusion, lightheadedness/dizziness, light sensitivity, speech changes.  She states that her only symptom is pain.  This episode is similar in quality to her previous migraines but she states that the pain is slightly more severe.  In the past, she has received a few doses of Fioricet for abortive therapy when her headache is refractory to her other medications.  I have sent in a prescription for 3 doses and instructed her to contact us if her headache does not improve for a full evaluation in our clinic.

## 2023-10-10 ENCOUNTER — Other Ambulatory Visit: Payer: Self-pay | Admitting: Student

## 2023-10-12 ENCOUNTER — Other Ambulatory Visit: Payer: Self-pay | Admitting: Student

## 2023-10-12 NOTE — Telephone Encounter (Signed)
This was a short course - not meant to be a long term med

## 2023-10-13 ENCOUNTER — Telehealth: Payer: Self-pay | Admitting: *Deleted
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
## 2023-10-17 ENCOUNTER — Encounter: Payer: Self-pay | Admitting: Internal Medicine

## 2023-10-17 ENCOUNTER — Ambulatory Visit (INDEPENDENT_AMBULATORY_CARE_PROVIDER_SITE_OTHER): Payer: MEDICAID | Admitting: Internal Medicine

## 2023-10-17 DIAGNOSIS — G43009 Migraine without aura, not intractable, without status migrainosus: Secondary | ICD-10-CM

## 2023-10-17 DIAGNOSIS — I10 Essential (primary) hypertension: Secondary | ICD-10-CM

## 2023-10-17 DIAGNOSIS — M545 Low back pain, unspecified: Secondary | ICD-10-CM

## 2023-10-17 DIAGNOSIS — F419 Anxiety disorder, unspecified: Secondary | ICD-10-CM

## 2023-10-17 DIAGNOSIS — E039 Hypothyroidism, unspecified: Secondary | ICD-10-CM | POA: Diagnosis not present

## 2023-10-17 MED ORDER — BUTALBITAL-APAP-CAFFEINE 50-325-40 MG PO TABS: 1.0000 | ORAL_TABLET | Freq: Four times a day (QID) | ORAL | 0 refills | Status: DC | PRN

## 2023-10-17 MED ORDER — PROPRANOLOL HCL 80 MG PO TABS: 80.0000 mg | ORAL_TABLET | Freq: Three times a day (TID) | ORAL | 1 refills | Status: AC

## 2023-10-17 MED ORDER — GABAPENTIN 300 MG PO CAPS: 300.0000 mg | ORAL_CAPSULE | Freq: Three times a day (TID) | ORAL | 2 refills | Status: AC

## 2023-10-17 MED ORDER — SIMVASTATIN 40 MG PO TABS: ORAL_TABLET | ORAL | 3 refills | Status: DC

## 2023-10-17 MED ORDER — CYCLOBENZAPRINE HCL 10 MG PO TABS: 10.0000 mg | ORAL_TABLET | Freq: Three times a day (TID) | ORAL | 0 refills | Status: DC

## 2023-10-17 MED ORDER — LEVOTHYROXINE SODIUM 125 MCG PO TABS: ORAL_TABLET | ORAL | 11 refills | Status: AC

## 2023-10-17 MED ORDER — HYDROXYZINE HCL 50 MG PO TABS
50.0000 mg | ORAL_TABLET | Freq: Four times a day (QID) | ORAL | 0 refills | Status: AC | PRN
Start: 2023-10-17 — End: ?

## 2023-10-17 NOTE — Progress Notes (Signed)
   CC: Anxiety, medication refills  This is a telephone encounter between [name] and [name] on 10/17/2023 for anxiety and medication refills. The visit was conducted with the patient located at home and [name] at [facility]. The patient's identity was confirmed using their DOB and current address. The patient has consented to being evaluated through a telephone encounter and understands the associated risks (an examination cannot be done and the patient may need to come in for an appointment) / benefits (allows the patient to remain at home, decreasing exposure to coronavirus). I personally spent 20 minutes on medical discussion.   HPI:  [name] is a 52 y.o. with PMH as below.   Please see A&P for assessment of the patient's acute and chronic medical conditions.   Past Medical History:  Diagnosis Date   Acute midline low back pain 01/13/2016   Bipolar 1 disorder (HCC)    Followed by Mental Health.  All psychiatric medications prescribed by Mental Health.  Stable for many years.     Bronchitis 03/15/2018   Deep venous thrombosis of leg (HCC) 2012   completed year of coumadin    Dyspareunia 03/04/11   Healthcare maintenance 08/16/2013   Hx of measles    Hypothyroidism    Hypothyroidism since 8th grade.  Managed by Dr. Sharl Ma, Endocrinology.  On synthroid.  No prior thyroid surgery/ablation.    Migraines    Monilia infection 12/31/10   RHINITIS, ALLERGIC NOS 04/10/2007   Qualifier: Diagnosis of  By: Duke Salvia     Vaginal atrophy 03/04/11    Assessment & Plan:   Patient set up this telehealth visit today as she needed refills on multiple medications: Propanolol 80mg  TID, Simvastatin 40mg , Synthroid , Hydroxyzine 50mg , Gabapentin 300mg  TID, Flexeril 10mg  BID, Fioricet 50-325-40mg . She also reported increased anxiety regarding the holiday season and requested something additional for anxiety.   Last TSH was checked in August and was elevated at 4.960. Will need  to recheck at next visit in approximately 1 month.   Gabapentin was recently refilled with an 18 day supply on 10/11/23, will give a 30 day refill starting on 10/29/23.   Patient does follow with [facility] mental health. Recommended reaching out to them if additional medication is needed for increased anxiety.   All other medications deemed appropriate for refill and were refilled as requested. Patient had no other complaints today. All questions answered. I spent 20 minutes on medical discussion during this encounter.   Patient seen with Dr. Assunta Gambles Internal Medicine Resident

## 2023-10-19 NOTE — Progress Notes (Signed)
Internal Medicine Clinic Attending  I was physically present during the key portions of the resident provided service and participated in the medical decision making of patient's management care. I reviewed pertinent patient test results.  The assessment, diagnosis, and plan were formulated together and I agree with the documentation in the resident's note.  Mercie Eon, MD

## 2023-10-19 NOTE — Addendum Note (Signed)
Addended by: Monna Fam on: 10/19/2023 11:19 AM   Modules accepted: Level of Service

## 2023-10-26 ENCOUNTER — Other Ambulatory Visit: Payer: Self-pay | Admitting: Student

## 2023-10-26 DIAGNOSIS — R42 Dizziness and giddiness: Secondary | ICD-10-CM

## 2023-10-27 ENCOUNTER — Telehealth: Payer: Self-pay | Admitting: Student

## 2023-10-27 NOTE — Telephone Encounter (Signed)
Patient was called x2; no answer. Left HIPAA compliant VM.

## 2023-10-28 ENCOUNTER — Telehealth: Payer: Self-pay | Admitting: Internal Medicine

## 2023-10-28 NOTE — Telephone Encounter (Signed)
Received after hours page.  Unable to reach patient, HIPAA complaint VM left.

## 2023-10-29 ENCOUNTER — Other Ambulatory Visit: Payer: Self-pay | Admitting: Student

## 2023-10-29 DIAGNOSIS — I10 Essential (primary) hypertension: Secondary | ICD-10-CM

## 2023-10-29 DIAGNOSIS — G43009 Migraine without aura, not intractable, without status migrainosus: Secondary | ICD-10-CM

## 2023-11-06 ENCOUNTER — Other Ambulatory Visit: Payer: Self-pay | Admitting: Student

## 2023-11-06 ENCOUNTER — Other Ambulatory Visit: Payer: Self-pay | Admitting: Internal Medicine

## 2023-11-06 DIAGNOSIS — M545 Low back pain, unspecified: Secondary | ICD-10-CM

## 2023-11-06 DIAGNOSIS — G43009 Migraine without aura, not intractable, without status migrainosus: Secondary | ICD-10-CM

## 2023-11-06 NOTE — Telephone Encounter (Signed)
 Medication sent to pharmacy

## 2023-11-16 ENCOUNTER — Encounter: Payer: MEDICAID | Admitting: Student

## 2023-11-22 ENCOUNTER — Other Ambulatory Visit: Payer: Self-pay | Admitting: Student

## 2023-11-22 DIAGNOSIS — M545 Low back pain, unspecified: Secondary | ICD-10-CM

## 2023-11-24 ENCOUNTER — Other Ambulatory Visit: Payer: Self-pay

## 2023-11-24 ENCOUNTER — Telehealth: Payer: Self-pay | Admitting: *Deleted

## 2023-11-24 DIAGNOSIS — M545 Low back pain, unspecified: Secondary | ICD-10-CM

## 2023-11-24 DIAGNOSIS — G43009 Migraine without aura, not intractable, without status migrainosus: Secondary | ICD-10-CM

## 2023-11-24 MED ORDER — CYCLOBENZAPRINE HCL 10 MG PO TABS: 10.0000 mg | ORAL_TABLET | Freq: Three times a day (TID) | ORAL | 0 refills | Status: DC

## 2023-11-24 MED ORDER — RIZATRIPTAN BENZOATE 5 MG PO TABS: ORAL_TABLET | ORAL | 0 refills | Status: AC

## 2023-11-24 MED ORDER — RIZATRIPTAN BENZOATE 5 MG PO TABS: ORAL_TABLET | ORAL | 0 refills | Status: DC

## 2023-11-24 MED ORDER — BUTALBITAL-APAP-CAFFEINE 50-325-40 MG PO TABS: 1.0000 | ORAL_TABLET | Freq: Four times a day (QID) | ORAL | 0 refills | Status: DC | PRN

## 2023-11-24 MED ORDER — BUTALBITAL-APAP-CAFFEINE 50-325-40 MG PO TABS: 1.0000 | ORAL_TABLET | Freq: Four times a day (QID) | ORAL | 0 refills | Status: AC | PRN

## 2023-11-24 NOTE — Addendum Note (Signed)
Addended by: Rocky Morel on: 11/24/2023 08:05 PM   Modules accepted: Orders

## 2023-11-24 NOTE — Telephone Encounter (Signed)
Received a call from Robin at [facility] pharmacy to inform the doctor ; the pt is trying to get more Maxalt. Stated pt got #12 tabs on 1/10 and calling for more. The pharmacist stated it is not supposed to be taken every day and insurance co will only pay for 12 tabs in a 30 days period.

## 2023-11-24 NOTE — Telephone Encounter (Addendum)
Pt called the after hours line requesting a refill on her cyclobenzaprine due to reaggravation of her back pain.  This was recently filled with a 16-17 day course on 11/06/2023.  She has been taking it up to 3 times a day as prescribed.  She has an upcoming appointment on 12/04/2023 with her PCP.  I will refill this medication today I recommend that they discuss whether or not she will continue on this as needed at her next visit.

## 2023-12-04 ENCOUNTER — Encounter: Payer: MEDICAID | Admitting: Student

## 2023-12-10 ENCOUNTER — Other Ambulatory Visit: Payer: Self-pay | Admitting: Student

## 2023-12-10 DIAGNOSIS — M545 Low back pain, unspecified: Secondary | ICD-10-CM

## 2023-12-11 NOTE — Telephone Encounter (Signed)
 NOT [facility] PATIENT

## 2023-12-13 ENCOUNTER — Other Ambulatory Visit: Payer: Self-pay | Admitting: Student

## 2023-12-13 ENCOUNTER — Other Ambulatory Visit: Payer: Self-pay | Admitting: Internal Medicine

## 2023-12-13 DIAGNOSIS — M545 Low back pain, unspecified: Secondary | ICD-10-CM

## 2023-12-13 MED ORDER — CYCLOBENZAPRINE HCL 10 MG PO TABS: 10.0000 mg | ORAL_TABLET | Freq: Three times a day (TID) | ORAL | 0 refills | Status: AC

## 2023-12-13 NOTE — Progress Notes (Addendum)
Responded to after hours pate. Patient calling expressing frustration that cyclobenzaprine was refused today. On chart review, I see that she sent a request 02/10 and it was noted that she is NOT an [facility] patient. She had an appointment with Dr. Allena Katz on 02/03 but this was cancelled. Prior to that on an after hours call she was told by Dr. Geraldo Pitter that she needed to follow up with her PCP regarding additional refills.  I will pass this information along to the front desk as we are not listed as her PCP and it is noted in recent communication that she is not an [facility] patient (refill communication 12/11/2023 at 8:47 AM).  It will need to be communicated with her if she is not a patient of ours any longer, and where she should request future refills from moving forward.  I will send in 10 tablets (3 day supply). She was advised that she should contact the clinic tomorrow morning first thing. Further refills should not be sent without additional clarification as above. Before I could finish communicating this with her, she hung up the phone.   Champ Mungo, DO

## 2023-12-26 ENCOUNTER — Telehealth: Payer: Self-pay | Admitting: *Deleted

## 2023-12-26 NOTE — Telephone Encounter (Signed)
 Patient called requesting to return to [facility]. Was told at this time we are not accepting new patients and suggested she call her new provider that she has established with.

## 2024-01-16 ENCOUNTER — Telehealth: Payer: Self-pay | Admitting: Diagnostic Neuroimaging

## 2024-01-16 NOTE — Telephone Encounter (Signed)
 Pt called stating that she is wanting the provider to refill her butalbital-acetaminophen-caffeine (FIORICET) 50-325-40 MG tablet  I explained to the pt that this medication was not prescribed by our office and she stated that she knows but that he can just call it in for her. Please advise.

## 2024-01-17 NOTE — Telephone Encounter (Signed)
 Left detailed message on voicemail last seen in 2023 and per last office note " Return for return to PCP, pending if symptoms worsen or fail to improve.  I left Rx can be filled by PCP.

## 2024-02-16 LAB — LAB REPORT - SCANNED
EGFR: 44.5
TSH: 1.42 (ref 0.41–5.90)

## 2024-03-06 ENCOUNTER — Telehealth: Payer: Self-pay | Admitting: Hematology and Oncology

## 2024-03-06 NOTE — Telephone Encounter (Signed)
 Patient has been scheduled for follow-up visit per 03/06/24 LOS.  Pt aware of scheduled appt details.

## 2024-03-13 ENCOUNTER — Other Ambulatory Visit: Payer: Self-pay | Admitting: Hematology and Oncology

## 2024-03-13 ENCOUNTER — Inpatient Hospital Stay: Payer: MEDICAID

## 2024-03-13 ENCOUNTER — Inpatient Hospital Stay: Payer: MEDICAID | Admitting: Hematology and Oncology

## 2024-03-13 DIAGNOSIS — D72829 Elevated white blood cell count, unspecified: Secondary | ICD-10-CM

## 2024-03-14 ENCOUNTER — Inpatient Hospital Stay: Payer: MEDICAID | Admitting: Hematology and Oncology

## 2024-03-14 ENCOUNTER — Other Ambulatory Visit: Payer: Self-pay | Admitting: Hematology and Oncology

## 2024-03-14 ENCOUNTER — Inpatient Hospital Stay: Payer: MEDICAID

## 2024-03-14 DIAGNOSIS — D72829 Elevated white blood cell count, unspecified: Secondary | ICD-10-CM

## 2024-03-18 ENCOUNTER — Other Ambulatory Visit: Payer: Self-pay | Admitting: Hematology and Oncology

## 2024-03-18 DIAGNOSIS — D72829 Elevated white blood cell count, unspecified: Secondary | ICD-10-CM

## 2024-03-19 ENCOUNTER — Other Ambulatory Visit: Payer: Self-pay

## 2024-03-19 ENCOUNTER — Inpatient Hospital Stay: Payer: MEDICAID | Attending: Hematology and Oncology

## 2024-03-19 ENCOUNTER — Telehealth: Payer: Self-pay | Admitting: Hematology and Oncology

## 2024-03-19 ENCOUNTER — Encounter: Payer: Self-pay | Admitting: Hematology and Oncology

## 2024-03-19 ENCOUNTER — Inpatient Hospital Stay (HOSPITAL_BASED_OUTPATIENT_CLINIC_OR_DEPARTMENT_OTHER): Payer: MEDICAID | Admitting: Hematology and Oncology

## 2024-03-19 VITALS — BP 124/93 | HR 70 | Temp 99.1°F | Resp 14 | Ht 63.5 in | Wt 223.9 lb

## 2024-03-19 DIAGNOSIS — D72829 Elevated white blood cell count, unspecified: Secondary | ICD-10-CM | POA: Insufficient documentation

## 2024-03-19 DIAGNOSIS — Z86718 Personal history of other venous thrombosis and embolism: Secondary | ICD-10-CM | POA: Diagnosis not present

## 2024-03-19 LAB — CBC WITH DIFFERENTIAL (CANCER CENTER ONLY)
Abs Immature Granulocytes: 0.05 10*3/uL (ref 0.00–0.07)
Basophils Absolute: 0 10*3/uL (ref 0.0–0.1)
Basophils Relative: 0 %
Eosinophils Absolute: 0.1 10*3/uL (ref 0.0–0.5)
Eosinophils Relative: 1 %
HCT: 39.7 % (ref 36.0–46.0)
Hemoglobin: 13.1 g/dL (ref 12.0–15.0)
Immature Granulocytes: 0 %
Lymphocytes Relative: 25 %
Lymphs Abs: 2.8 10*3/uL (ref 0.7–4.0)
MCH: 28.2 pg (ref 26.0–34.0)
MCHC: 33 g/dL (ref 30.0–36.0)
MCV: 85.6 fL (ref 80.0–100.0)
Monocytes Absolute: 0.8 10*3/uL (ref 0.1–1.0)
Monocytes Relative: 7 %
Neutro Abs: 7.7 10*3/uL (ref 1.7–7.7)
Neutrophils Relative %: 67 %
Platelet Count: 239 10*3/uL (ref 150–400)
RBC: 4.64 MIL/uL (ref 3.87–5.11)
RDW: 14.1 % (ref 11.5–15.5)
WBC Count: 11.5 10*3/uL — ABNORMAL HIGH (ref 4.0–10.5)
nRBC: 0 % (ref 0.0–0.2)

## 2024-03-19 LAB — CMP (CANCER CENTER ONLY)
ALT: 15 U/L (ref 0–44)
AST: 15 U/L (ref 15–41)
Albumin: 4 g/dL (ref 3.5–5.0)
Alkaline Phosphatase: 80 U/L (ref 38–126)
Anion gap: 11 (ref 5–15)
BUN: 28 mg/dL — ABNORMAL HIGH (ref 6–20)
CO2: 24 mmol/L (ref 22–32)
Calcium: 9.4 mg/dL (ref 8.9–10.3)
Chloride: 105 mmol/L (ref 98–111)
Creatinine: 1.52 mg/dL — ABNORMAL HIGH (ref 0.44–1.00)
GFR, Estimated: 40 mL/min — ABNORMAL LOW (ref >60)
Glucose, Bld: 137 mg/dL — ABNORMAL HIGH (ref 70–99)
Potassium: 4.2 mmol/L (ref 3.5–5.1)
Sodium: 140 mmol/L (ref 135–145)
Total Bilirubin: 0.2 mg/dL (ref 0.0–1.2)
Total Protein: 6.9 g/dL (ref 6.5–8.1)

## 2024-03-19 LAB — IRON AND TIBC
Iron: 69 ug/dL (ref 28–170)
Saturation Ratios: 17 % (ref 10.4–31.8)
TIBC: 403 ug/dL (ref 250–450)
UIBC: 334 ug/dL

## 2024-03-19 LAB — FOLATE: Folate: 4.3 ng/mL — ABNORMAL LOW (ref >5.9)

## 2024-03-19 LAB — TECHNOLOGIST SMEAR REVIEW: Plt Morphology: NORMAL

## 2024-03-19 LAB — FERRITIN: Ferritin: 49 ng/mL (ref 11–307)

## 2024-03-19 NOTE — Telephone Encounter (Signed)
 Patient has been scheduled for follow-up visit per 03/18/24 LOS.  Pt aware of scheduled appt details.

## 2024-03-20 ENCOUNTER — Telehealth: Payer: Self-pay

## 2024-03-20 NOTE — Progress Notes (Signed)
 [facility] [address] [place],  [place]  [address] [phone]  Clinic Day:  03/20/2024   Referring physician: [facility]  Patient Care Team: Patient Care Team: [facility] as PCP - General   REASON FOR CONSULTATION:  Elevated white count  HISTORY OF PRESENT ILLNESS:  [name] is a 53 y.o. female with a history of elevated white count who is referred in consultation by [facility] for assessment and management. She presents today with her sister in attendance. She has had elevations in her white count as far back as 2023 with the most recent being 11.0 on 02/15/24. She reports having chronic infections, mostly sinus and ear. She is on and off antibiotics frequently. She was also recently treated with prednisone  for a skin condition. She denies fever, chills, nausea or vomiting. She denies cough, shortness of breath or chest pain. She denies issue with bowel or bladder. She does complain of increasing menopausal symptoms having had a hysterectomy 24 years ago. Medical history includes depression, high blood pressure, high cholesterol, neuropathy, anxiety, vertigo, stroke and thyroid  disorder. Surgical history consists of hysterectomy 24 years ago, appendectomy 1995, cholecystectomy 1995 and right knee surgery 23 years ago. Family history includes high blood pressure, heart disease, stroke and thyroid  disorders. She is a current every day smoker having started at age 28.    REVIEW OF SYSTEMS:  Review of Systems  Constitutional: Negative.   HENT:  Negative.    Eyes: Negative.   Respiratory: Negative.    Cardiovascular: Negative.   Gastrointestinal: Negative.   Endocrine: Negative.   Genitourinary: Negative.    Musculoskeletal: Negative.   Skin: Negative.   Neurological: Negative.   Hematological: Negative.   Psychiatric/Behavioral: Negative.       VITALS:   Blood pressure (!) 124/93, pulse 70,  temperature 99.1 F (37.3 C), temperature source Oral, resp. rate 14, height 5' 3.5" (1.613 m), weight 223 lb 14.4 oz (101.6 kg), SpO2 96%.  Wt Readings from Last 3 Encounters:  03/19/24 223 lb 14.4 oz (101.6 kg)  06/14/23 211 lb 8 oz (95.9 kg)  11/01/22 206 lb (93.4 kg)    Body mass index is 39.04 kg/m.  Performance status (ECOG): 1 - Symptomatic but completely ambulatory  PHYSICAL EXAM:  Physical Exam Constitutional:      Appearance: Normal appearance. She is normal weight.  HENT:     Head: Normocephalic and atraumatic.     Mouth/Throat:     Mouth: Mucous membranes are moist.  Cardiovascular:     Rate and Rhythm: Normal rate and regular rhythm.     Pulses: Normal pulses.     Heart sounds: Normal heart sounds.  Pulmonary:     Effort: Pulmonary effort is normal.     Breath sounds: Normal breath sounds.  Abdominal:     General: Abdomen is flat.  Musculoskeletal:        General: Normal range of motion.     Cervical back: Normal range of motion.  Skin:    General: Skin is warm and dry.  Neurological:     General: No focal deficit present.     Mental Status: She is alert and oriented to person, place, and time. Mental status is at baseline.  Psychiatric:        Mood and Affect: Mood normal.        Behavior: Behavior normal.        Thought Content: Thought content normal.  Judgment: Judgment normal.      LABS:      Latest Ref Rng & Units 03/19/2024   12:28 PM 06/14/2023    3:33 PM 05/01/2022    7:50 PM  CBC  WBC 4.0 - 10.5 K/uL 11.5  13.1  11.8   Hemoglobin 12.0 - 15.0 g/dL 03.4  74.2  59.5   Hematocrit 36.0 - 46.0 % 39.7  38.1  39.3   Platelets 150 - 400 K/uL 239  333  203       Latest Ref Rng & Units 03/19/2024   12:28 PM 06/14/2023    3:33 PM 05/01/2022    7:50 PM  CMP  Glucose 70 - 99 mg/dL 638  85  756   BUN 6 - 20 mg/dL 28  19  15    Creatinine 0.44 - 1.00 mg/dL 4.33  2.95  1.88   Sodium 135 - 145 mmol/L 140  142  141   Potassium 3.5 - 5.1 mmol/L 4.2   4.8  3.5   Chloride 98 - 111 mmol/L 105  109  111   CO2 22 - 32 mmol/L 24  16  18    Calcium 8.9 - 10.3 mg/dL 9.4  9.3  8.6   Total Protein 6.5 - 8.1 g/dL 6.9  7.0    Total Bilirubin 0.0 - 1.2 mg/dL 0.2  <4.1    Alkaline Phos 38 - 126 U/L 80  94    AST 15 - 41 U/L 15  14    ALT 0 - 44 U/L 15  12       No results found for: "CEA1", "CEA" / No results found for: "CEA1", "CEA" No results found for: "PSA1" No results found for: "YSA630" No results found for: "CAN125"  No results found for: "TOTALPROTELP", "ALBUMINELP", "A1GS", "A2GS", "BETS", "BETA2SER", "GAMS", "MSPIKE", "SPEI" Lab Results  Component Value Date   TIBC 403 03/19/2024   FERRITIN 49 03/19/2024   FERRITIN 24 04/19/2012   IRONPCTSAT 17 03/19/2024   No results found for: "LDH"  STUDIES:  No results found.    HISTORY:   Past Medical History:  Diagnosis Date   Acute midline low back pain 01/13/2016   Anxiety    Bipolar 1 disorder (HCC)    Followed by Mental Health.  All psychiatric medications prescribed by Mental Health.  Stable for many years.     Bronchitis 03/15/2018   Deep venous thrombosis of leg (HCC) 2012   completed year of coumadin    Dyspareunia 03/04/2011   Healthcare maintenance 08/16/2013   High cholesterol    Hx of measles    Hypertension    Hypothyroidism    Hypothyroidism since 8th grade.  Managed by Dr. Kathyanne Parkers, Endocrinology.  On synthroid .  No prior thyroid  surgery/ablation.    Menopausal and female climacteric states    Migraines    Monilia infection 12/31/2010   RHINITIS, ALLERGIC NOS 04/10/2007   Qualifier: Diagnosis of  By: Jenice Mitts     Stroke [facility])    Vaginal atrophy 03/04/2011    Past Surgical History:  Procedure Laterality Date   ABDOMINAL HYSTERECTOMY  10/31/1998   APPENDECTOMY     CARPAL TUNNEL RELEASE Right    CESAREAN SECTION  10/31/1996   CHOLECYSTECTOMY     REPLACEMENT TOTAL KNEE  11/01/1999   right knee, metal knee   TONSILLECTOMY     WISDOM TOOTH EXTRACTION       Family History  Problem Relation Age of Onset   Hypertension Mother  Stroke Mother    Thyroid  disease Mother    Thyroid  disease Sister    Stroke Sister    Breast cancer Maternal Aunt    Stroke Maternal Grandmother    Heart disease Maternal Grandfather     Social History:  reports that she has been smoking cigarettes. She has never used smokeless tobacco. She reports that she does not drink alcohol and does not use drugs.The patient is accompanied by sister today.  Allergies:  Allergies  Allergen Reactions   Codeine Nausea And Vomiting and Rash   Morphine Shortness Of Breath   Sulfa Antibiotics Shortness Of Breath   Sulfonamide Derivatives Shortness Of Breath, Nausea Only and Rash   Amoxapine And Related    Hydrocodone  Nausea And Vomiting   Meloxicam  Nausea Only   Morphine And Codeine Other (See Comments)    Makes me loopy and hallucinates   Naproxen Nausea Only and Rash    Current Medications: Current Outpatient Medications  Medication Sig Dispense Refill   buPROPion  (WELLBUTRIN  XL) 150 MG 24 hr tablet Take 150 mg by mouth daily.   2   buPROPion  (WELLBUTRIN  XL) 300 MG 24 hr tablet Take 300 mg by mouth daily.     busPIRone  (BUSPAR ) 15 MG tablet Take 15 mg by mouth 4 (four) times daily.     cariprazine (VRAYLAR) 3 MG capsule Take 3 mg by mouth daily.     cyclobenzaprine  (FLEXERIL ) 10 MG tablet Take 1 tablet (10 mg total) by mouth 3 (three) times daily. 10 tablet 0   estradiol  (ESTRACE ) 2 MG tablet Take 1 tablet by mouth daily.     gabapentin  (NEURONTIN ) 300 MG capsule Take 1 capsule (300 mg total) by mouth 3 (three) times daily. 90 capsule 2   hydrOXYzine  (ATARAX ) 50 MG tablet Take 1 tablet (50 mg total) by mouth every 6 (six) hours as needed. 30 tablet 0   levothyroxine  (SYNTHROID ) 125 MCG tablet TAKE 1 TABLET(125 MCG) BY MOUTH DAILY BEFORE BREAKFAST 30 tablet 11   progesterone (PROMETRIUM) 100 MG capsule Take 200 mg by mouth daily.     propranolol  (INDERAL ) 80 MG  tablet Take 1 tablet (80 mg total) by mouth 3 (three) times daily. 270 tablet 1   rizatriptan  (MAXALT ) 5 MG tablet TAKE 1 TABLET BY MOUTH ONCE DAILY AS NEEDED FOR MIGRAINE HEADACHE 30 tablet 0   simvastatin  (ZOCOR ) 40 MG tablet TAKE 1 TABLET BY MOUTH EVERY DAY AT 6 PM 90 tablet 3   acetaminophen  (TYLENOL ) 500 MG tablet Take 1,500 mg by mouth every 6 (six) hours as needed for moderate pain or headache.     albuterol  (VENTOLIN  HFA) 108 (90 Base) MCG/ACT inhaler Inhale 1-2 puffs into the lungs every 6 (six) hours as needed for wheezing or shortness of breath. 8 g 2   aluminum  chloride (DRYSOL) 20 % external solution Apply topically daily. Apply once daily at bedtime; once excessive sweating has stopped, may decrease to once or twice weekly, or as needed. Wash treated area in the morning. 35 mL 3   No current facility-administered medications for this visit.     ASSESSMENT & PLAN:   Assessment:  NATHASHA FIORILLO is a 53 y.o. female with history of elevated white count dating back to 2023. She reports chronic sinus and ear infections. She notes being off and on antibiotics frequently. She did recently undergo a course of prednisone  for a dermatologic condition. We discussed that her chronic infections and recent steroid use could be a factor in her  white count being elevated. She denies any overt signs of infection today and her skin condition has cleared. CBC today reveals white count 11.6. She will return to clinic in 2 weeks for pending labs including   Plan: 1.  Return to clinic in 2 weeks for review of pending labs.   I discussed the assessment and treatment plan with the patient.  The patient was provided an opportunity to ask questions and all were answered.  The patient agreed with the plan and demonstrated an understanding of the instructions.    Thank you for the referral    45 minutes was spent in patient care.  This included time spent preparing to see the patient (e.g., review of  tests), obtaining and/or reviewing separately obtained history, counseling and educating the patient/family/caregiver, ordering medications, tests, or procedures; documenting clinical information in the electronic or other health record, independently interpreting results and communicating results to the patient/family/caregiver as well as coordination of care.      Adelaide Adjutant, NP   Nurse Practitioner - Board Certified [facility] [place] [phone]

## 2024-03-20 NOTE — Telephone Encounter (Signed)
 Pt states the OBGYN office she was referred to doesn't accept Medicaid pts. She needs a new referral to an office that does accept Medicaid pts.

## 2024-03-22 LAB — BCR-ABL1 FISH
Cells Analyzed: 200
Cells Counted: 200

## 2024-03-26 LAB — JAK2 GENOTYPR

## 2024-04-02 ENCOUNTER — Other Ambulatory Visit: Payer: Self-pay | Admitting: Hematology and Oncology

## 2024-04-02 ENCOUNTER — Inpatient Hospital Stay: Payer: MEDICAID

## 2024-04-02 ENCOUNTER — Inpatient Hospital Stay: Payer: MEDICAID | Attending: Hematology and Oncology | Admitting: Hematology and Oncology

## 2024-04-02 ENCOUNTER — Other Ambulatory Visit: Payer: Self-pay

## 2024-04-02 VITALS — BP 129/93 | HR 71 | Temp 99.2°F | Resp 18 | Ht 63.5 in | Wt 225.8 lb

## 2024-04-02 DIAGNOSIS — F17210 Nicotine dependence, cigarettes, uncomplicated: Secondary | ICD-10-CM | POA: Diagnosis not present

## 2024-04-02 DIAGNOSIS — D72829 Elevated white blood cell count, unspecified: Secondary | ICD-10-CM

## 2024-04-02 LAB — CBC WITH DIFFERENTIAL (CANCER CENTER ONLY)
Abs Immature Granulocytes: 0.06 10*3/uL (ref 0.00–0.07)
Basophils Absolute: 0 10*3/uL (ref 0.0–0.1)
Basophils Relative: 0 %
Eosinophils Absolute: 0.2 10*3/uL (ref 0.0–0.5)
Eosinophils Relative: 2 %
HCT: 38.1 % (ref 36.0–46.0)
Hemoglobin: 12.7 g/dL (ref 12.0–15.0)
Immature Granulocytes: 1 %
Lymphocytes Relative: 31 %
Lymphs Abs: 3.4 10*3/uL (ref 0.7–4.0)
MCH: 28.4 pg (ref 26.0–34.0)
MCHC: 33.3 g/dL (ref 30.0–36.0)
MCV: 85.2 fL (ref 80.0–100.0)
Monocytes Absolute: 0.8 10*3/uL (ref 0.1–1.0)
Monocytes Relative: 7 %
Neutro Abs: 6.5 10*3/uL (ref 1.7–7.7)
Neutrophils Relative %: 59 %
Platelet Count: 265 10*3/uL (ref 150–400)
RBC: 4.47 MIL/uL (ref 3.87–5.11)
RDW: 14.9 % (ref 11.5–15.5)
WBC Count: 10.9 10*3/uL — ABNORMAL HIGH (ref 4.0–10.5)
nRBC: 0 % (ref 0.0–0.2)

## 2024-04-02 NOTE — Progress Notes (Signed)
 [facility]  [address] [place],  [place]  [phone]  Clinic Day:  04/02/2024  Referring physician: [facility]  ASSESSMENT & PLAN:   Assessment & Plan:  [name] is a 53 y.o. female with history of elevated white count dating back to 2023. She reports chronic sinus and ear infections. She notes being off and on antibiotics frequently. She did recently undergo a course of prednisone  for a dermatologic condition. We discussed that her chronic infections and recent steroid use could be a factor in her white count being elevated. She denies any overt signs of infection today and her skin condition has cleared. CBC today reveals white count 10.9. BCR-ABL1 and JAK2 testing are both negative. She will continue to monitor her white count with her PCP as there doesn't seem to be a hematologic reason behind it. She will return to see us  on an as needed basis.   The patient understands the plans discussed today and is in agreement with them.  She knows to contact our office if she develops concerns prior to her next appointment.   I provided 20 minutes of face-to-face time during this encounter and > 50% was spent counseling as documented under my assessment and plan.    Adelaide Adjutant, NP  [facility]  HOSPITAL [address] [place] [address] Dept: [phone] Dept Fax: [phone]   No orders of the defined types were placed in this encounter.     CHIEF COMPLAINT:  CC: Elevated white count   Current Treatment:  Surveillance  HISTORY OF PRESENT ILLNESS:   Oncology History   No history exists.      INTERVAL HISTORY:  [name] is here today for repeat clinical assessment. She denies fevers or chills. She denies pain. Her appetite is good. Her weight has been stable.  REVIEW OF SYSTEMS:  Review of Systems  Constitutional: Negative.   HENT:   Negative.    Eyes: Negative.   Respiratory: Negative.    Cardiovascular: Negative.   Gastrointestinal: Negative.   Endocrine: Negative.   Genitourinary: Negative.    Musculoskeletal: Negative.   Skin: Negative.   Neurological: Negative.   Hematological: Negative.   Psychiatric/Behavioral: Negative.       VITALS:   Blood pressure (!) 129/93, pulse 71, temperature 99.2 F (37.3 C), temperature source Oral, resp. rate 18, height 5' 3.5" (1.613 m), weight 225 lb 12.8 oz (102.4 kg), SpO2 98%.  Wt Readings from Last 3 Encounters:  04/02/24 225 lb 12.8 oz (102.4 kg)  03/19/24 223 lb 14.4 oz (101.6 kg)  06/14/23 211 lb 8 oz (95.9 kg)    Body mass index is 39.37 kg/m.  Performance status (ECOG): 0 - Asymptomatic    PHYSICAL EXAM:  Physical Exam Constitutional:      Appearance: Normal appearance. She is normal weight.  HENT:     Head: Normocephalic.     Mouth/Throat:     Mouth: Mucous membranes are moist.  Cardiovascular:     Rate and Rhythm: Normal rate and regular rhythm.     Pulses: Normal pulses.     Heart sounds: Normal heart sounds.  Pulmonary:     Effort: Pulmonary effort is normal.     Breath sounds: Normal breath sounds.  Abdominal:     Palpations: Abdomen is soft.  Musculoskeletal:        General: Normal range of motion.  Cervical back: Normal range of motion.  Skin:    General: Skin is warm and dry.  Neurological:     General: No focal deficit present.     Mental Status: She is alert and oriented to person, place, and time. Mental status is at baseline.  Psychiatric:        Mood and Affect: Mood normal.        Behavior: Behavior normal.        Thought Content: Thought content normal.        Judgment: Judgment normal.     LABS:      Latest Ref Rng & Units 04/02/2024    2:03 PM 03/19/2024   12:28 PM 06/14/2023    3:33 PM  CBC  WBC 4.0 - 10.5 K/uL 10.9  11.5  13.1   Hemoglobin 12.0 - 15.0 g/dL 40.9  81.1  91.4   Hematocrit 36.0 - 46.0 % 38.1  39.7   38.1   Platelets 150 - 400 K/uL 265  239  333       Latest Ref Rng & Units 03/19/2024   12:28 PM 06/14/2023    3:33 PM 05/01/2022    7:50 PM  CMP  Glucose 70 - 99 mg/dL 782  85  956   BUN 6 - 20 mg/dL 28  19  15    Creatinine 0.44 - 1.00 mg/dL 2.13  0.86  5.78   Sodium 135 - 145 mmol/L 140  142  141   Potassium 3.5 - 5.1 mmol/L 4.2  4.8  3.5   Chloride 98 - 111 mmol/L 105  109  111   CO2 22 - 32 mmol/L 24  16  18    Calcium 8.9 - 10.3 mg/dL 9.4  9.3  8.6   Total Protein 6.5 - 8.1 g/dL 6.9  7.0    Total Bilirubin 0.0 - 1.2 mg/dL 0.2  <4.6    Alkaline Phos 38 - 126 U/L 80  94    AST 15 - 41 U/L 15  14    ALT 0 - 44 U/L 15  12       No results found for: "CEA1", "CEA" / No results found for: "CEA1", "CEA" No results found for: "PSA1" No results found for: "NGE952" No results found for: "CAN125"  No results found for: "TOTALPROTELP", "ALBUMINELP", "A1GS", "A2GS", "BETS", "BETA2SER", "GAMS", "MSPIKE", "SPEI" Lab Results  Component Value Date   TIBC 403 03/19/2024   FERRITIN 49 03/19/2024   FERRITIN 24 04/19/2012   IRONPCTSAT 17 03/19/2024   No results found for: "LDH"  STUDIES:  No results found.    HISTORY:   Past Medical History:  Diagnosis Date   Acute midline low back pain 01/13/2016   Anxiety    Bipolar 1 disorder (HCC)    Followed by Mental Health.  All psychiatric medications prescribed by Mental Health.  Stable for many years.     Bronchitis 03/15/2018   Deep venous thrombosis of leg (HCC) 2012   completed year of coumadin    Dyspareunia 03/04/2011   Healthcare maintenance 08/16/2013   High cholesterol    Hx of measles    Hypertension    Hypothyroidism    Hypothyroidism since 8th grade.  Managed by Dr. Kathyanne Parkers, Endocrinology.  On synthroid .  No prior thyroid  surgery/ablation.    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 She has never used smokeless tobacco. She reports that she does not drink alcohol and does not use drugs.The patient is accompanied by friend today.  Allergies:  Allergies  Allergen Reactions   Codeine Nausea And Vomiting and Rash   Morphine Shortness Of Breath   Sulfa Antibiotics Shortness Of Breath   Sulfonamide Derivatives Shortness Of Breath, Nausea Only and Rash   Amoxapine And Related    Hydrocodone  Nausea And Vomiting   Meloxicam  Nausea Only   Morphine And Codeine Other (See Comments)    Makes me loopy and hallucinates   Naproxen Nausea Only and Rash    Current Medications: Current Outpatient Medications  Medication Sig Dispense Refill   acetaminophen  (TYLENOL ) 500 MG tablet Take 1,500 mg by mouth every 6 (six) hours as needed for moderate pain or headache.     albuterol  (VENTOLIN  HFA) 108 (90 Base) MCG/ACT inhaler Inhale 1-2 puffs into the lungs every 6 (six) hours as needed for wheezing or shortness of breath. 8 g 2   aluminum  chloride (DRYSOL) 20 % external solution Apply topically daily. Apply once daily at bedtime; once excessive sweating has stopped, may decrease to once or twice weekly, or as needed. Wash treated area in the morning. 35 mL 3   buPROPion  (WELLBUTRIN  XL) 150 MG 24 hr tablet Take 150 mg by  mouth daily.   2   buPROPion  (WELLBUTRIN  XL) 300 MG 24 hr tablet Take 300 mg by mouth daily.     busPIRone  (BUSPAR ) 15 MG tablet Take 15 mg by mouth 4 (four) times daily.     cariprazine (VRAYLAR) 3 MG capsule Take 3 mg by mouth daily.     cyclobenzaprine  (FLEXERIL ) 10 MG tablet Take 1 tablet (10 mg total) by mouth 3 (three) times daily. 10 tablet 0   estradiol  (ESTRACE ) 2 MG tablet Take 1 tablet by mouth daily.     gabapentin  (NEURONTIN ) 300 MG capsule Take 1 capsule (300 mg total) by mouth 3 (three) times daily. 90 capsule 2   hydrOXYzine  (ATARAX ) 50 MG tablet Take 1 tablet (50 mg total) by mouth every 6 (six) hours as needed. 30 tablet 0   levothyroxine  (SYNTHROID ) 125 MCG tablet TAKE 1 TABLET(125 MCG) BY MOUTH DAILY BEFORE BREAKFAST 30 tablet 11   progesterone (PROMETRIUM) 100 MG capsule Take 200 mg by mouth daily.     propranolol  (INDERAL ) 80 MG tablet Take 1 tablet (80 mg total) by mouth 3 (three) times daily. 270 tablet 1   rizatriptan  (MAXALT ) 5 MG tablet TAKE 1 TABLET BY MOUTH ONCE DAILY AS NEEDED FOR MIGRAINE HEADACHE 30 tablet 0   simvastatin  (ZOCOR ) 40 MG tablet TAKE 1 TABLET  BY MOUTH EVERY DAY AT 6 PM 90 tablet 3   No current facility-administered medications for this visit.

## 2024-04-22 ENCOUNTER — Other Ambulatory Visit: Payer: Self-pay | Admitting: Internal Medicine

## 2024-04-22 DIAGNOSIS — I10 Essential (primary) hypertension: Secondary | ICD-10-CM

## 2024-04-22 DIAGNOSIS — G43009 Migraine without aura, not intractable, without status migrainosus: Secondary | ICD-10-CM

## 2024-04-22 NOTE — Telephone Encounter (Signed)
 NO LONGER [facility] PATIENT

## 2024-05-15 ENCOUNTER — Other Ambulatory Visit: Payer: Self-pay | Admitting: Oral Surgery

## 2024-05-17 LAB — SURGICAL PATHOLOGY

## 2024-07-15 ENCOUNTER — Telehealth: Payer: Self-pay | Admitting: Diagnostic Neuroimaging

## 2024-07-15 NOTE — Telephone Encounter (Signed)
 Requesting letter on letterhead stating when had stroke, no vision in left eye, unable to drive proof stroke, impact of the stroke, Would like letter mailed to my home address: [address]  [place] [phone]

## 2024-07-17 NOTE — Telephone Encounter (Signed)
 Pt called back and is demanding to speak to a nurse because she does not understand why she can't get the letter she needs if this is the office where she was dx with the stroke and was sent from here to an eye doctor and informed her that nothing else can be done for her here at this office. Please advise .

## 2024-07-17 NOTE — Telephone Encounter (Signed)
 Patient said please send copy of last office note to home address.

## 2024-07-18 NOTE — Telephone Encounter (Signed)
 Adrien can you please mail pts last OV notes. I cannot provide a letter to speak on her current state from a stroke standpoint as we seen her for vision concerns and papilledema.

## 2024-08-05 ENCOUNTER — Other Ambulatory Visit: Payer: Self-pay

## 2024-08-05 DIAGNOSIS — I10 Essential (primary) hypertension: Secondary | ICD-10-CM | POA: Insufficient documentation

## 2024-08-05 DIAGNOSIS — R079 Chest pain, unspecified: Secondary | ICD-10-CM | POA: Insufficient documentation

## 2024-08-05 DIAGNOSIS — R9431 Abnormal electrocardiogram [ECG] [EKG]: Secondary | ICD-10-CM | POA: Insufficient documentation

## 2024-08-05 DIAGNOSIS — F419 Anxiety disorder, unspecified: Secondary | ICD-10-CM | POA: Insufficient documentation

## 2024-08-05 DIAGNOSIS — E785 Hyperlipidemia, unspecified: Secondary | ICD-10-CM | POA: Insufficient documentation

## 2024-08-05 DIAGNOSIS — I639 Cerebral infarction, unspecified: Secondary | ICD-10-CM | POA: Insufficient documentation

## 2024-08-05 DIAGNOSIS — Z8619 Personal history of other infectious and parasitic diseases: Secondary | ICD-10-CM | POA: Insufficient documentation

## 2024-08-05 DIAGNOSIS — F319 Bipolar disorder, unspecified: Secondary | ICD-10-CM | POA: Insufficient documentation

## 2024-08-05 DIAGNOSIS — E7800 Pure hypercholesterolemia, unspecified: Secondary | ICD-10-CM | POA: Insufficient documentation

## 2024-08-05 DIAGNOSIS — N951 Menopausal and female climacteric states: Secondary | ICD-10-CM | POA: Insufficient documentation

## 2024-08-12 ENCOUNTER — Other Ambulatory Visit: Payer: Self-pay | Admitting: Student

## 2024-08-12 DIAGNOSIS — Z1231 Encounter for screening mammogram for malignant neoplasm of breast: Secondary | ICD-10-CM

## 2024-08-13 ENCOUNTER — Inpatient Hospital Stay: Admission: RE | Admit: 2024-08-13 | Payer: MEDICAID | Source: Ambulatory Visit

## 2024-08-20 ENCOUNTER — Ambulatory Visit: Payer: MEDICAID | Admitting: Diagnostic Neuroimaging

## 2024-09-02 ENCOUNTER — Ambulatory Visit: Payer: MEDICAID

## 2024-09-11 ENCOUNTER — Ambulatory Visit: Payer: MEDICAID

## 2024-09-17 ENCOUNTER — Ambulatory Visit: Payer: MEDICAID

## 2024-09-17 VITALS — BP 130/82 | HR 65 | Ht 63.5 in | Wt 213.0 lb

## 2024-09-17 DIAGNOSIS — R9431 Abnormal electrocardiogram [ECG] [EKG]: Secondary | ICD-10-CM | POA: Diagnosis present

## 2024-09-17 DIAGNOSIS — R0609 Other forms of dyspnea: Secondary | ICD-10-CM | POA: Insufficient documentation

## 2024-09-17 DIAGNOSIS — E782 Mixed hyperlipidemia: Secondary | ICD-10-CM | POA: Diagnosis present

## 2024-09-17 DIAGNOSIS — I10 Essential (primary) hypertension: Secondary | ICD-10-CM | POA: Insufficient documentation

## 2024-09-17 DIAGNOSIS — Z720 Tobacco use: Secondary | ICD-10-CM | POA: Diagnosis present

## 2024-09-17 DIAGNOSIS — R072 Precordial pain: Secondary | ICD-10-CM | POA: Insufficient documentation

## 2024-09-17 HISTORY — DX: Other forms of dyspnea: R06.09

## 2024-09-17 MED ORDER — ROSUVASTATIN CALCIUM 10 MG PO TABS: 10.0000 mg | ORAL_TABLET | Freq: Every day | ORAL | 3 refills | Status: AC

## 2024-09-17 MED ORDER — METOPROLOL TARTRATE 100 MG PO TABS: ORAL_TABLET | ORAL | 0 refills | Status: DC

## 2024-09-17 NOTE — Progress Notes (Signed)
 Cardiology Consultation:    Date:  09/17/2024   ID:  [name], DOB 03/04/1971, MRN [id number]  PCP:  Wendy Sharan KATHEE, NP  Cardiologist:  Wendy JONELLE Kobus, MD   Referring MD: Wendy Sharan KATHEE, NP   No chief complaint on file.    ASSESSMENT AND PLAN:   [name] 53 year old woman history of normal coronary angiogram by cardiac cath 20 years ago at [facility] [no report available], anxiety, depression, CKD stage III, remote history of DVT [completed a year of warfarin therapy], hyperlipidemia, hypertension, hypothyroidism, GERD, smokes 4 cigarettes a day.   Here for further evaluation of symptoms of progressive dyspnea on exertion and an episode of intense chest discomfort lasting for several minutes radiating to the jaw.  Problem List Items Addressed This Visit     Tobacco abuse   Advised about harmful effects of smoking and strongly recommended to quit. She acknowledges this information and mentions that she is aware and she will work on further cutting back.        Chest pain - Primary   Atypical single episode about a month ago while at rest lasting several minutes. No further recurrence. Has no specific EKG changes with low voltage QRS and anteroseptal ST-T changes.  She does have significant cardiovascular risk factors. Along with her associated symptoms of dyspnea on exertion progressive over the last few months, would recommend further evaluation for obstructive coronary artery disease.  Obtain cardiac CT coronary angiogram.  Use of IV contrast, radiation exposure discussed. She is agreeable. Alternate options of stress test reviewed however given her atypical symptoms and EKG changes at baseline not an ideal option.  Will also obtain transthoracic echocardiogram to rule out any significant structural and functional abnormalities.  Advised her to continue taking aspirin  81 mg once daily.       Relevant Orders   CT CORONARY MORPH W/CTA COR  W/SCORE W/CA W/CM &/OR WO/CM   Hyperlipidemia   Lipid panel from 07/24/2024 LDL 71, HDL 45, total cholesterol 811 and triglycerides 358.  Advised about diet modification to target low fats in her diet. Resume statins. She is agreeable to start and will prescribe Crestor 10 mg once daily.       Relevant Medications   rosuvastatin (CRESTOR) 10 MG tablet   Hypertension   Relevant Medications   rosuvastatin (CRESTOR) 10 MG tablet   Other Relevant Orders   EKG 12-Lead (Completed)   Dyspnea on exertion   Progressive symptoms of dyspnea on exertion over the past couple months. Associated with an episode of chest pain.  Workup with echocardiogram and cardiac CT as discussed above under chest pain.       Relevant Orders   ECHOCARDIOGRAM COMPLETE   Other Visit Diagnoses       Nonspecific abnormal electrocardiogram (ECG) (EKG)       Relevant Orders   EKG 12-Lead (Completed)      Return to clinic tentatively in 2 months.  Earlier follow-up on an as-needed basis based on test results. Recommended to go to the ER or call 911 right away if her symptoms are acutely worsening and not relieving with rest.    History of Present Illness:    [name] is a 53 y.o. female who is being seen today for the evaluation of chest pain and abnormal EKG at the request of Coward, Maci B, NP.  Has a history of normal coronary angiogram by cardiac cath 20 years ago at [facility] [  no report available], anxiety, depression, CKD stage III, remote history of DVT [completed a year of warfarin therapy], hyperlipidemia, hypertension, hypothyroidism, GERD, smokes 4 cigarettes a day. Denies any prior history of CAD, CHF, MI. Does report family members, grandparents with heart disease in their 79s.  Pleasant woman here for the visit today by herself.  Lives currently with her older sister.  She is currently unemployed, due to being disabled due to right knee pain and anxiety.  Mentions over  the past couple months she has been having symptoms of shortness of breath with exertion and day-to-day activities in the house or outdoor activities such as grocery shopping.  Had an intense episode of chest discomfort about a month ago at home in the evening as she was sitting down and lasted for several minutes, radiating to the jaw, no further recurrence of such episode.  She did not seek medical attention right away at the time as symptoms subsided.  With gradually decreasing functional capacity and easy fatigability and shortness of breath she was concerned and approached her PCP for further evaluation and was referred here.  Denies any pedal edema, lightheadedness, dizziness or syncopal episodes. No orthopnea or paroxysmal nocturnal dyspnea. No nausea or vomiting.  She self discontinued simvastatin  about a month ago due to concerns about potential side effects although she was tolerating the medication well for several years without any problems.  She mentions this was mostly due to her sister's recommendation about potential harmful effects.  Has been taking fish oil.  EKG in the office shows sinus rhythm heart rate 65/min, low voltage QRS, nonspecific ST-T changes anteroseptal leads. EKG at PCPs office 07/24/2024 shows sinus rhythm heart rate 73/min, nonspecific ST-T changes in anterolateral leads.  Blood work from 07/24/2024 shows hemoglobin 12.7, hematocrit 40, WBC 11.7 and platelets 278 Hemoglobin A1c 5 Lipid panel total cholesterol 188, triglycerides 358, LDL 71, HDL 45 Free T4 normal 1.2, free T3 normal 2.31 TSH normal 1.42, BNP normal 97.1 Sodium 140, potassium 4.6 BUN 20, creatinine 1.26, eGFR 50 Normal transaminases and alkaline phosphatase  Past Medical History:  Diagnosis Date   Acute electrocardiogram changes    Anxiety    Back pain 03/14/2023   Bipolar 1 disorder (HCC)    Followed by Mental Health.  All psychiatric medications prescribed by Mental Health.  Stable for  many years.     Bipolar I disorder, most recent episode depressed (HCC) 01/17/2007   Followed by Mental Health.  All psychiatric medications prescribed by Mental Health.  Stable for many years.        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  Managed by Dr. Faythe, Endocrinology.  On synthroid .  No prior thyroid  surgery/ablation.     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  Referred to [facility] Dr. Gladis; consult note 05/26/22.  Papilledema resolved.  No cause identified (per chart review)     Obesity 06/24/2016   Right knee pain 08/14/2012   S/p fall while walking dog, history of R knee replacement.     Stroke [facility])    Surgical menopause 03/16/2012   TAH 2000; on HRT util 2014, stopped due to potential risk of ca.  Gabapentin  ifor management of sxs initiated 2013.  Premarin  trial for hot flashes 2022 ineffective.  Has been followed in past by Dr. Raeanne, OB/GYN     Tobacco abuse 08/14/2012   Not ready to quit.     Vaginal atrophy 03/04/2011    Past Surgical History:  Procedure Laterality Date   ABDOMINAL HYSTERECTOMY  10/31/1998    APPENDECTOMY     CARPAL TUNNEL RELEASE Right    CESAREAN SECTION  10/31/1996   CHOLECYSTECTOMY     REPLACEMENT TOTAL KNEE  11/01/1999   right knee, metal knee   TONSILLECTOMY     WISDOM TOOTH EXTRACTION      Current Medications: Current Meds  Medication Sig   albuterol  (VENTOLIN  HFA) 108 (90 Base) MCG/ACT inhaler Inhale 1-2 puffs into the lungs every 6 (six) hours as needed for wheezing or shortness of breath.   aluminum  chloride (DRYSOL) 20 % external solution Apply topically daily. Apply once daily at bedtime; once excessive sweating has stopped, may decrease to once or twice weekly, or as needed. Wash treated area in the morning.   aspirin  EC 81 MG tablet Take 81 mg by mouth daily.   buPROPion  (WELLBUTRIN  XL) 150 MG 24 hr tablet Take 150 mg by mouth daily.    buPROPion  (WELLBUTRIN  XL) 300 MG 24 hr tablet Take 300 mg by mouth daily.   busPIRone  (BUSPAR ) 15 MG tablet Take 15 mg by mouth 4 (four) times daily.   cariprazine (VRAYLAR) 3 MG capsule Take 3 mg by mouth daily.   cyclobenzaprine  (FLEXERIL ) 10 MG tablet Take 1 tablet (10 mg total) by mouth 3 (three) times daily.   cyclobenzaprine  (FLEXERIL ) 5 MG tablet Take 5 mg by mouth every 8 (eight) hours.   gabapentin  (NEURONTIN ) 300 MG capsule Take 1 capsule (300 mg total) by mouth 3 (three) times daily.   hydrOXYzine  (ATARAX ) 50 MG tablet Take 1 tablet (50 mg total) by mouth every 6 (six) hours as needed.   levothyroxine  (SYNTHROID ) 125 MCG tablet TAKE 1 TABLET(125 MCG) BY MOUTH DAILY BEFORE BREAKFAST   meclizine  (ANTIVERT ) 25 MG tablet Take 25 mg by mouth 2 (two) times daily as needed for dizziness.   nitroGLYCERIN  (NITROSTAT ) 0.4 MG SL tablet Place 0.4 mg under the tongue every 5 (five) minutes as needed for chest pain.   oxyCODONE  (OXY IR/ROXICODONE ) 5 MG immediate release tablet Take 5 mg by mouth every 6 (six) hours as needed for moderate pain (pain score 4-6) or severe pain (pain score 7-10). for pain   pantoprazole  (PROTONIX ) 40  MG tablet Take 40 mg by mouth daily.   PARoxetine (PAXIL) 20 MG tablet Take 20 mg by mouth daily.   prazosin  (MINIPRESS ) 2 MG capsule Take 2 mg by mouth at bedtime.   propranolol  (INDERAL ) 80 MG tablet Take 1 tablet (80 mg total) by mouth 3 (three) times  daily.   rizatriptan  (MAXALT ) 5 MG tablet TAKE 1 TABLET BY MOUTH ONCE DAILY AS NEEDED FOR MIGRAINE HEADACHE   rosuvastatin (CRESTOR) 10 MG tablet Take 1 tablet (10 mg total) by mouth daily.   [DISCONTINUED] metoprolol  tartrate (LOPRESSOR ) 100 MG tablet Take one tablet 2 hours before cardiac CT for heart greater than 55   [DISCONTINUED] simvastatin  (ZOCOR ) 40 MG tablet TAKE 1 TABLET BY MOUTH EVERY DAY AT 6 PM     Allergies:   Codeine, Morphine, Sulfa antibiotics, Sulfonamide derivatives, Amoxapine and related, Amoxicillin , Hydrocodone , Meloxicam , Morphine and codeine, and Naproxen   Social History   Socioeconomic History   Marital status: Legally Separated    Spouse name: Not on file   Number of children: 1   Years of education: Not on file   Highest education level: Bachelor's degree (e.g., BA, AB, BS)  Occupational History   Not on file  Tobacco Use   Smoking status: Every Day    Current packs/day: 0.50    Types: Cigarettes   Smokeless tobacco: Never   Tobacco comments:    0.5 PPD  Vaping Use   Vaping status: Never Used  Substance and Sexual Activity   Alcohol use: No    Alcohol/week: 0.0 standard drinks of alcohol   Drug use: No   Sexual activity: Not on file    Comment: hysterectomy  Other Topics Concern   Not on file  Social History Narrative   Drink Mtn Dew, Coke all day   Lives with sister   Social Drivers of Health   Financial Resource Strain: Low Risk  (11/01/2022)   Overall Financial Resource Strain (CARDIA)    Difficulty of Paying Living Expenses: Not hard at all  Food Insecurity: No Food Insecurity (03/19/2024)   Hunger Vital Sign    Worried About Running Out of Food in the Last Year: Never true    Ran Out  of Food in the Last Year: Never true  Transportation Needs: No Transportation Needs (11/01/2022)   PRAPARE - Administrator, Civil Service (Medical): No    Lack of Transportation (Non-Medical): No  Physical Activity: Not on file  Stress: Not on file  Social Connections: Moderately Integrated (11/01/2022)   Social Connection and Isolation Panel    Frequency of Communication with Friends and Family: More than three times a week    Frequency of Social Gatherings with Friends and Family: More than three times a week    Attends Religious Services: More than 4 times per year    Active Member of [employer] or Organizations: Yes    Attends Engineer, Structural: More than 4 times per year    Marital Status: Separated     Family History: The patient's family history includes Breast cancer in her maternal aunt; Heart disease in her maternal grandfather; Hypertension in her mother; Stroke in her maternal grandmother, mother, and sister; Thyroid  disease in her mother and sister. ROS:   Please see the history of present illness.    All 14 point review of systems negative except as described per history of present illness.  EKGs/Labs/Other Studies Reviewed:    The following studies were reviewed today:   EKG:  EKG Interpretation Date/Time:  Tuesday September 17 2024 14:18:34 EST Ventricular Rate:  65 PR Interval:  162 QRS Duration:  90 QT Interval:  416 QTC Calculation: 432 R Axis:   14  Text Interpretation: Normal sinus rhythm Low voltage QRS Cannot rule out Anteroseptal infarct , age undetermined  When compared with ECG of 01-May-2022 19:32, PREVIOUS ECG IS PRESENT Confirmed by Liborio Hai reddy [id number]) on 09/17/2024 2:55:14 PM    Recent Labs: 02/16/2024: TSH 1.42 03/19/2024: ALT 15; BUN 28; Creatinine 1.52; Potassium 4.2; Sodium 140 04/02/2024: Hemoglobin 12.7; Platelet Count 265  Recent Lipid Panel    Component Value Date/Time   CHOL 296 (H) 06/14/2023 1533   TRIG 410  (H) 06/14/2023 1533   HDL 39 (L) 06/14/2023 1533   CHOLHDL 7.6 (H) 06/14/2023 1533   CHOLHDL 2.6 01/29/2014 1601   VLDL 31 01/29/2014 1601   LDLCALC 176 (H) 06/14/2023 1533    Physical Exam:    VS:  BP 130/82   Pulse 65   Ht 5' 3.5 (1.613 m)   Wt 213 lb (96.6 kg)   SpO2 98%   BMI 37.14 kg/m     Wt Readings from Last 3 Encounters:  09/17/24 213 lb (96.6 kg)  04/02/24 225 lb 12.8 oz (102.4 kg)  03/19/24 223 lb 14.4 oz (101.6 kg)     GENERAL:  Well nourished, well developed in no acute distress NECK: No JVD; No carotid bruits CARDIAC: RRR, S1 and S2 present, no murmurs, no rubs, no gallops CHEST:  Clear to auscultation without rales, wheezing or rhonchi  Extremities: No pitting pedal edema. Pulses bilaterally symmetric with radial 2+ and dorsalis pedis 2+ NEUROLOGIC:  Alert and oriented x 3  Medication Adjustments/Labs and Tests Ordered: Current medicines are reviewed at length with the patient today.  Concerns regarding medicines are outlined above.  Orders Placed This Encounter  Procedures   CT CORONARY MORPH W/CTA COR W/SCORE W/CA W/CM &/OR WO/CM   EKG 12-Lead   ECHOCARDIOGRAM COMPLETE   Meds ordered this encounter  Medications   rosuvastatin (CRESTOR) 10 MG tablet    Sig: Take 1 tablet (10 mg total) by mouth daily.    Dispense:  90 tablet    Refill:  3   DISCONTD: metoprolol  tartrate (LOPRESSOR ) 100 MG tablet    Sig: Take one tablet 2 hours before cardiac CT for heart greater than 55    Dispense:  1 tablet    Refill:  0    Signed, Tera Pellicane reddy Damiana Berrian, MD, MPH, [facility]. 09/17/2024 3:35 PM    [facility]

## 2024-09-17 NOTE — Assessment & Plan Note (Signed)
 Lipid panel from 07/24/2024 LDL 71, HDL 45, total cholesterol 811 and triglycerides 358.  Advised about diet modification to target low fats in her diet. Resume statins. She is agreeable to start and will prescribe Crestor 10 mg once daily.

## 2024-09-17 NOTE — Patient Instructions (Addendum)
 Medication Instructions:   START: Rosuvastatin 10mg  1 tablet daily     Lab Work: None Ordered If you have labs (blood work) drawn today and your tests are completely normal, you will receive your results only by: MyChart Message (if you have MyChart) OR A paper copy in the mail If you have any lab test that is abnormal or we need to change your treatment, we will call you to review the results.   Testing/Procedures:  Your physician has requested that you have an echocardiogram. Echocardiography is a painless test that uses sound waves to create images of your heart. It provides your doctor with information about the size and shape of your heart and how well your heart's chambers and valves are working. This procedure takes approximately one hour. There are no restrictions for this procedure. Please do NOT wear cologne, perfume, aftershave, or lotions (deodorant is allowed). Please arrive 15 minutes prior to your appointment time.  Please note: We ask at that you not bring children with you during ultrasound (echo/ vascular) testing. Due to room size and safety concerns, children are not allowed in the ultrasound rooms during exams. Our front office staff cannot provide observation of children in our lobby area while testing is being conducted. An adult accompanying a patient to their appointment will only be allowed in the ultrasound room at the discretion of the ultrasound technician under special circumstances. We apologize for any inconvenience.   Your cardiac CT will be scheduled at one of the below locations:   [facility] [address] [place], [place] [address] [phone]  Please follow these instructions carefully (unless otherwise directed):     On the Night Before the Test: Be sure to Drink plenty of water. Do not consume any caffeinated/decaffeinated beverages or chocolate 12 hours prior to your test. Do not take any antihistamines 12 hours prior to your  test.   On the Day of the Test: Drink plenty of water until 1 hour prior to the test. Do not eat any food 4 hours prior to the test. You may take your regular medications prior to the test.  HOLD Furosemide/Hydrochlorothiazide morning of the test. FEMALES- please wear underwire-free bra if available, avoid dresses & tight clothing       After the Test: Drink plenty of water. After receiving IV contrast, you may experience a mild flushed feeling. This is normal. On occasion, you may experience a mild rash up to 24 hours after the test. This is not dangerous. If this occurs, you can take Benadryl  25 mg and increase your fluid intake. If you experience trouble breathing, this can be serious. If it is severe call 911 IMMEDIATELY. If it is mild, please call our office. If you take any of these medications: Glipizide/Metformin, Avandament, Glucavance, please do not take 48 hours after completing test unless otherwise instructed.  We will call to schedule your test 2-4 weeks out understanding that some insurance companies will need an authorization prior to the service being performed.   For non-scheduling related questions, please contact the cardiac imaging nurse navigator should you have any questions/concerns: Camie Shutter, Cardiac Imaging Nurse Navigator Chantal Requena, Cardiac Imaging Nurse Navigator [facility] Direct Office Dial: [phone]   For scheduling needs, including cancellations and rescheduling, please call Brittany, [phone].    Follow-Up: At [facility], you and your health needs are our priority.  As part of our continuing mission to provide you with exceptional heart care, we have created  designated Provider Care Teams.  These Care Teams include your primary Cardiologist (physician) and Advanced Practice Providers (APPs -  Physician Assistants and Nurse Practitioners) who all work together to provide you with the care you need, when you  need it.  We recommend signing up for the patient portal called MyChart.  Sign up information is provided on this After Visit Summary.  MyChart is used to connect with patients for Virtual Visits (Telemedicine).  Patients are able to view lab/test results, encounter notes, upcoming appointments, etc.  Non-urgent messages can be sent to your provider as well.   To learn more about what you can do with MyChart, go to [email or web address].    Your next appointment:   2 month(s)  The format for your next appointment:   In Person  Provider:   Alean Kobus, MD   Other Instructions NA

## 2024-09-17 NOTE — Assessment & Plan Note (Signed)
 Atypical single episode about a month ago while at rest lasting several minutes. No further recurrence. Has no specific EKG changes with low voltage QRS and anteroseptal ST-T changes.  She does have significant cardiovascular risk factors. Along with her associated symptoms of dyspnea on exertion progressive over the last few months, would recommend further evaluation for obstructive coronary artery disease.  Obtain cardiac CT coronary angiogram.  Use of IV contrast, radiation exposure discussed. She is agreeable. Alternate options of stress test reviewed however given her atypical symptoms and EKG changes at baseline not an ideal option.  Will also obtain transthoracic echocardiogram to rule out any significant structural and functional abnormalities.  Advised her to continue taking aspirin  81 mg once daily.

## 2024-09-17 NOTE — Assessment & Plan Note (Signed)
 Advised about harmful effects of smoking and strongly recommended to quit. She acknowledges this information and mentions that she is aware and she will work on further cutting back.

## 2024-09-17 NOTE — Assessment & Plan Note (Signed)
 Progressive symptoms of dyspnea on exertion over the past couple months. Associated with an episode of chest pain.  Workup with echocardiogram and cardiac CT as discussed above under chest pain.

## 2024-10-07 ENCOUNTER — Ambulatory Visit: Payer: MEDICAID

## 2024-11-04 ENCOUNTER — Ambulatory Visit: Payer: MEDICAID

## 2024-11-07 ENCOUNTER — Other Ambulatory Visit (HOSPITAL_BASED_OUTPATIENT_CLINIC_OR_DEPARTMENT_OTHER): Payer: MEDICAID | Admitting: Radiology

## 2024-11-19 ENCOUNTER — Ambulatory Visit: Payer: MEDICAID

## 2024-11-26 ENCOUNTER — Encounter (HOSPITAL_COMMUNITY): Payer: Self-pay

## 2024-11-27 ENCOUNTER — Telehealth (HOSPITAL_COMMUNITY): Payer: Self-pay | Admitting: Emergency Medicine

## 2024-11-27 NOTE — Telephone Encounter (Signed)
 Pt requesting to r/s CCTA tomorrow due to having bronchitis. Camie Shutter RN Navigator Cardiac Imaging [facility] [phone] Office  [phone] Cell

## 2024-11-28 ENCOUNTER — Inpatient Hospital Stay (HOSPITAL_BASED_OUTPATIENT_CLINIC_OR_DEPARTMENT_OTHER): Admission: RE | Admit: 2024-11-28 | Payer: MEDICAID | Source: Ambulatory Visit | Admitting: Radiology

## 2024-12-02 ENCOUNTER — Ambulatory Visit: Payer: MEDICAID

## 2024-12-03 ENCOUNTER — Other Ambulatory Visit: Payer: Self-pay | Admitting: *Deleted

## 2024-12-05 ENCOUNTER — Ambulatory Visit: Payer: MEDICAID

## 2024-12-06 ENCOUNTER — Ambulatory Visit: Payer: MEDICAID
